# Patient Record
Sex: Male | Born: 1945 | Race: White | Hispanic: No | Marital: Married | Smoking: Former smoker
Health system: Southern US, Community
[De-identification: ages and names within clinical notes are randomized; demographics above are authoritative.]

## PROBLEM LIST (undated history)

## (undated) DIAGNOSIS — K219 Gastro-esophageal reflux disease without esophagitis: Secondary | ICD-10-CM

## (undated) DIAGNOSIS — I509 Heart failure, unspecified: Secondary | ICD-10-CM

## (undated) DIAGNOSIS — I10 Essential (primary) hypertension: Secondary | ICD-10-CM

## (undated) DIAGNOSIS — I255 Ischemic cardiomyopathy: Secondary | ICD-10-CM

## (undated) DIAGNOSIS — I219 Acute myocardial infarction, unspecified: Secondary | ICD-10-CM

## (undated) DIAGNOSIS — J449 Chronic obstructive pulmonary disease, unspecified: Secondary | ICD-10-CM

## (undated) DIAGNOSIS — Z9989 Dependence on other enabling machines and devices: Secondary | ICD-10-CM

## (undated) DIAGNOSIS — G4733 Obstructive sleep apnea (adult) (pediatric): Secondary | ICD-10-CM

## (undated) DIAGNOSIS — F329 Major depressive disorder, single episode, unspecified: Secondary | ICD-10-CM

## (undated) DIAGNOSIS — M109 Gout, unspecified: Secondary | ICD-10-CM

## (undated) DIAGNOSIS — F32A Depression, unspecified: Secondary | ICD-10-CM

## (undated) DIAGNOSIS — I501 Left ventricular failure: Secondary | ICD-10-CM

## (undated) DIAGNOSIS — E119 Type 2 diabetes mellitus without complications: Secondary | ICD-10-CM

## (undated) DIAGNOSIS — IMO0001 Reserved for inherently not codable concepts without codable children: Secondary | ICD-10-CM

## (undated) DIAGNOSIS — E7800 Pure hypercholesterolemia, unspecified: Secondary | ICD-10-CM

## (undated) DIAGNOSIS — D649 Anemia, unspecified: Secondary | ICD-10-CM

## (undated) DIAGNOSIS — C801 Malignant (primary) neoplasm, unspecified: Secondary | ICD-10-CM

## (undated) HISTORY — PX: CORONARY ANGIOPLASTY WITH STENT PLACEMENT: SHX49

## (undated) HISTORY — PX: TONSILLECTOMY: SUR1361

---

## 1988-12-06 DIAGNOSIS — I259 Chronic ischemic heart disease, unspecified: Secondary | ICD-10-CM | POA: Insufficient documentation

## 2004-06-21 ENCOUNTER — Other Ambulatory Visit: Payer: Self-pay

## 2004-06-22 ENCOUNTER — Other Ambulatory Visit: Payer: Self-pay

## 2006-05-15 ENCOUNTER — Other Ambulatory Visit: Payer: Self-pay

## 2006-05-15 ENCOUNTER — Inpatient Hospital Stay: Payer: Self-pay | Admitting: Psychiatry

## 2006-05-19 ENCOUNTER — Ambulatory Visit: Payer: Self-pay | Admitting: Unknown Physician Specialty

## 2006-06-05 ENCOUNTER — Ambulatory Visit: Payer: Self-pay | Admitting: Unknown Physician Specialty

## 2006-07-06 ENCOUNTER — Ambulatory Visit: Payer: Self-pay | Admitting: Unknown Physician Specialty

## 2006-08-19 ENCOUNTER — Inpatient Hospital Stay: Payer: Self-pay | Admitting: Internal Medicine

## 2006-08-19 ENCOUNTER — Other Ambulatory Visit: Payer: Self-pay

## 2006-11-03 ENCOUNTER — Ambulatory Visit: Payer: Self-pay | Admitting: Internal Medicine

## 2006-11-04 ENCOUNTER — Ambulatory Visit: Payer: Self-pay | Admitting: Internal Medicine

## 2006-12-29 ENCOUNTER — Ambulatory Visit: Payer: Self-pay | Admitting: Internal Medicine

## 2007-04-11 ENCOUNTER — Ambulatory Visit: Payer: Self-pay | Admitting: Internal Medicine

## 2007-05-08 ENCOUNTER — Ambulatory Visit: Payer: Self-pay | Admitting: Internal Medicine

## 2007-09-14 ENCOUNTER — Ambulatory Visit: Payer: Self-pay | Admitting: Gastroenterology

## 2009-12-12 ENCOUNTER — Ambulatory Visit: Payer: Self-pay | Admitting: Internal Medicine

## 2010-04-01 ENCOUNTER — Inpatient Hospital Stay: Payer: Self-pay | Admitting: Internal Medicine

## 2010-05-28 ENCOUNTER — Ambulatory Visit: Payer: Self-pay | Admitting: Urology

## 2010-06-03 ENCOUNTER — Ambulatory Visit: Payer: Self-pay | Admitting: Urology

## 2010-11-13 ENCOUNTER — Ambulatory Visit: Payer: Self-pay | Admitting: Internal Medicine

## 2010-11-24 HISTORY — PX: CORONARY ARTERY BYPASS GRAFT: SHX141

## 2010-12-20 ENCOUNTER — Emergency Department: Payer: Self-pay | Admitting: Emergency Medicine

## 2011-01-21 ENCOUNTER — Encounter: Payer: Self-pay | Admitting: Internal Medicine

## 2011-02-04 ENCOUNTER — Ambulatory Visit: Payer: Self-pay | Admitting: Internal Medicine

## 2011-02-04 ENCOUNTER — Encounter: Payer: Self-pay | Admitting: Internal Medicine

## 2011-03-07 ENCOUNTER — Ambulatory Visit: Payer: Self-pay | Admitting: Internal Medicine

## 2011-03-07 ENCOUNTER — Encounter: Payer: Self-pay | Admitting: Internal Medicine

## 2011-03-20 ENCOUNTER — Emergency Department: Payer: Self-pay | Admitting: Emergency Medicine

## 2011-03-31 ENCOUNTER — Ambulatory Visit: Payer: Self-pay | Admitting: Internal Medicine

## 2011-04-06 ENCOUNTER — Encounter: Payer: Self-pay | Admitting: Internal Medicine

## 2011-04-06 ENCOUNTER — Ambulatory Visit: Payer: Self-pay | Admitting: Internal Medicine

## 2011-05-07 ENCOUNTER — Encounter: Payer: Self-pay | Admitting: Internal Medicine

## 2011-12-20 ENCOUNTER — Emergency Department: Payer: Self-pay | Admitting: Emergency Medicine

## 2011-12-20 LAB — URINALYSIS, COMPLETE
Bacteria: NONE SEEN
Blood: NEGATIVE
Glucose,UR: NEGATIVE mg/dL (ref 0–75)
Leukocyte Esterase: NEGATIVE
Nitrite: NEGATIVE
Ph: 5 (ref 4.5–8.0)
Protein: NEGATIVE
RBC,UR: <1 /HPF (ref 0–5)
WBC UR: <1 /HPF (ref 0–5)

## 2011-12-20 LAB — CBC
MCH: 29.3 pg (ref 26.0–34.0)
MCV: 88 fL (ref 80–100)
Platelet: 187 10*3/uL (ref 150–440)
RBC: 4.25 10*6/uL — ABNORMAL LOW (ref 4.40–5.90)

## 2011-12-20 LAB — COMPREHENSIVE METABOLIC PANEL
Anion Gap: 11 (ref 7–16)
BUN: 18 mg/dL (ref 7–18)
Chloride: 99 mmol/L (ref 98–107)
EGFR (African American): >60
Osmolality: 281 (ref 275–301)
Potassium: 3.7 mmol/L (ref 3.5–5.1)
SGOT(AST): 37 U/L (ref 15–37)
SGPT (ALT): 52 U/L
Sodium: 138 mmol/L (ref 136–145)
Total Protein: 8.2 g/dL (ref 6.4–8.2)

## 2012-02-08 ENCOUNTER — Ambulatory Visit: Payer: Self-pay | Admitting: Gastroenterology

## 2012-03-01 ENCOUNTER — Ambulatory Visit: Payer: Self-pay | Admitting: Gastroenterology

## 2012-10-17 ENCOUNTER — Ambulatory Visit: Payer: Self-pay | Admitting: Internal Medicine

## 2012-11-26 ENCOUNTER — Emergency Department: Payer: Self-pay | Admitting: Emergency Medicine

## 2012-11-26 LAB — COMPREHENSIVE METABOLIC PANEL
Albumin: 3.5 g/dL (ref 3.4–5.0)
Anion Gap: 6 — ABNORMAL LOW (ref 7–16)
BUN: 12 mg/dL (ref 7–18)
Calcium, Total: 8.2 mg/dL — ABNORMAL LOW (ref 8.5–10.1)
Chloride: 107 mmol/L (ref 98–107)
Co2: 26 mmol/L (ref 21–32)
EGFR (African American): >60
EGFR (Non-African Amer.): >60
Glucose: 176 mg/dL — ABNORMAL HIGH (ref 65–99)
Osmolality: 282 (ref 275–301)
Potassium: 3.9 mmol/L (ref 3.5–5.1)
SGOT(AST): 27 U/L (ref 15–37)
Sodium: 139 mmol/L (ref 136–145)
Total Protein: 7.3 g/dL (ref 6.4–8.2)

## 2012-11-26 LAB — CBC
HCT: 32.6 % — ABNORMAL LOW (ref 40.0–52.0)
HGB: 11 g/dL — ABNORMAL LOW (ref 13.0–18.0)
MCH: 30.1 pg (ref 26.0–34.0)
MCHC: 33.7 g/dL (ref 32.0–36.0)
RBC: 3.64 10*6/uL — ABNORMAL LOW (ref 4.40–5.90)

## 2012-11-27 LAB — URINALYSIS, COMPLETE
Bacteria: NONE SEEN
Blood: NEGATIVE
Glucose,UR: NEGATIVE mg/dL (ref 0–75)
Leukocyte Esterase: NEGATIVE
Nitrite: NEGATIVE
Ph: 5 (ref 4.5–8.0)
Protein: NEGATIVE
Specific Gravity: 1.025 (ref 1.003–1.030)

## 2013-09-12 ENCOUNTER — Ambulatory Visit: Payer: Self-pay | Admitting: Internal Medicine

## 2013-12-06 DIAGNOSIS — C801 Malignant (primary) neoplasm, unspecified: Secondary | ICD-10-CM

## 2013-12-06 HISTORY — DX: Malignant (primary) neoplasm, unspecified: C80.1

## 2014-01-04 ENCOUNTER — Observation Stay: Payer: Self-pay | Admitting: Internal Medicine

## 2014-01-04 LAB — BASIC METABOLIC PANEL
Anion Gap: 9 (ref 7–16)
BUN: 10 mg/dL (ref 7–18)
CREATININE: 1.02 mg/dL (ref 0.60–1.30)
Calcium, Total: 8.2 mg/dL — ABNORMAL LOW (ref 8.5–10.1)
Chloride: 103 mmol/L (ref 98–107)
Co2: 24 mmol/L (ref 21–32)
EGFR (African American): >60
Glucose: 286 mg/dL — ABNORMAL HIGH (ref 65–99)
Osmolality: 281 (ref 275–301)
POTASSIUM: 3.7 mmol/L (ref 3.5–5.1)
SODIUM: 136 mmol/L (ref 136–145)

## 2014-01-04 LAB — CBC
HCT: 34.7 % — AB (ref 40.0–52.0)
HGB: 11.6 g/dL — AB (ref 13.0–18.0)
MCH: 30.3 pg (ref 26.0–34.0)
MCHC: 33.3 g/dL (ref 32.0–36.0)
MCV: 91 fL (ref 80–100)
PLATELETS: 197 10*3/uL (ref 150–440)
RBC: 3.82 10*6/uL — ABNORMAL LOW (ref 4.40–5.90)
RDW: 13.7 % (ref 11.5–14.5)
WBC: 7.3 10*3/uL (ref 3.8–10.6)

## 2014-01-04 LAB — PROTIME-INR
INR: 1
Prothrombin Time: 13.3 secs (ref 11.5–14.7)

## 2014-01-04 LAB — PRO B NATRIURETIC PEPTIDE: B-TYPE NATIURETIC PEPTID: 601 pg/mL — AB (ref 0–125)

## 2014-01-04 LAB — TROPONIN I: Troponin-I: <0.02 ng/mL

## 2014-01-05 LAB — TROPONIN I
Troponin-I: <0.02 ng/mL
Troponin-I: <0.02 ng/mL

## 2014-01-05 LAB — LIPID PANEL
Cholesterol: 140 mg/dL (ref 0–200)
HDL: 33 mg/dL — AB (ref 40–60)
Ldl Cholesterol, Calc: 86 mg/dL (ref 0–100)
Triglycerides: 103 mg/dL (ref 0–200)
VLDL Cholesterol, Calc: 21 mg/dL (ref 5–40)

## 2014-01-05 LAB — CK-MB
CK-MB: 1.5 ng/mL (ref 0.5–3.6)
CK-MB: 1.5 ng/mL (ref 0.5–3.6)
CK-MB: 1.6 ng/mL (ref 0.5–3.6)

## 2014-02-26 LAB — COMPREHENSIVE METABOLIC PANEL
ALBUMIN: 3.6 g/dL (ref 3.4–5.0)
ALK PHOS: 71 U/L
ALT: 68 U/L (ref 12–78)
Anion Gap: 4 — ABNORMAL LOW (ref 7–16)
BILIRUBIN TOTAL: 0.4 mg/dL (ref 0.2–1.0)
BUN: 19 mg/dL — AB (ref 7–18)
CHLORIDE: 105 mmol/L (ref 98–107)
CREATININE: 0.99 mg/dL (ref 0.60–1.30)
Calcium, Total: 8.4 mg/dL — ABNORMAL LOW (ref 8.5–10.1)
Co2: 29 mmol/L (ref 21–32)
EGFR (African American): >60
Glucose: 237 mg/dL — ABNORMAL HIGH (ref 65–99)
OSMOLALITY: 286 (ref 275–301)
POTASSIUM: 3.7 mmol/L (ref 3.5–5.1)
SGOT(AST): 57 U/L — ABNORMAL HIGH (ref 15–37)
Sodium: 138 mmol/L (ref 136–145)
TOTAL PROTEIN: 7.4 g/dL (ref 6.4–8.2)

## 2014-02-26 LAB — CBC
HCT: 34.6 % — ABNORMAL LOW (ref 40.0–52.0)
HGB: 11.5 g/dL — ABNORMAL LOW (ref 13.0–18.0)
MCH: 29.9 pg (ref 26.0–34.0)
MCHC: 33.2 g/dL (ref 32.0–36.0)
MCV: 90 fL (ref 80–100)
Platelet: 143 10*3/uL — ABNORMAL LOW (ref 150–440)
RBC: 3.85 10*6/uL — ABNORMAL LOW (ref 4.40–5.90)
RDW: 13.4 % (ref 11.5–14.5)
WBC: 7.9 10*3/uL (ref 3.8–10.6)

## 2014-02-26 LAB — PROTIME-INR
INR: 1.1
PROTHROMBIN TIME: 13.6 s (ref 11.5–14.7)

## 2014-02-26 LAB — TROPONIN I

## 2014-02-26 LAB — CK TOTAL AND CKMB (NOT AT ARMC)
CK, TOTAL: 115 U/L
CK-MB: 1.9 ng/mL (ref 0.5–3.6)

## 2014-02-26 LAB — APTT: Activated PTT: 28 secs (ref 23.6–35.9)

## 2014-02-27 ENCOUNTER — Observation Stay: Payer: Self-pay | Admitting: Family Medicine

## 2014-02-27 LAB — CK TOTAL AND CKMB (NOT AT ARMC)
CK, Total: 109 U/L
CK-MB: 2.4 ng/mL (ref 0.5–3.6)

## 2014-02-27 LAB — TROPONIN I
Troponin-I: 0.07 ng/mL — ABNORMAL HIGH
Troponin-I: 0.07 ng/mL — ABNORMAL HIGH

## 2014-06-12 DIAGNOSIS — C61 Malignant neoplasm of prostate: Secondary | ICD-10-CM | POA: Insufficient documentation

## 2014-12-11 ENCOUNTER — Emergency Department: Payer: Self-pay | Admitting: Emergency Medicine

## 2014-12-11 LAB — PRO B NATRIURETIC PEPTIDE: B-TYPE NATIURETIC PEPTID: 1270 pg/mL — AB (ref 0–125)

## 2014-12-11 LAB — PROTIME-INR
INR: 1.1
Prothrombin Time: 14.2 secs (ref 11.5–14.7)

## 2014-12-11 LAB — CBC
HCT: 35.1 % — AB (ref 40.0–52.0)
HGB: 11.2 g/dL — AB (ref 13.0–18.0)
MCH: 28.8 pg (ref 26.0–34.0)
MCHC: 31.8 g/dL — ABNORMAL LOW (ref 32.0–36.0)
MCV: 90 fL (ref 80–100)
Platelet: 266 10*3/uL (ref 150–440)
RBC: 3.88 10*6/uL — AB (ref 4.40–5.90)
RDW: 14.6 % — ABNORMAL HIGH (ref 11.5–14.5)
WBC: 14.3 10*3/uL — AB (ref 3.8–10.6)

## 2014-12-11 LAB — COMPREHENSIVE METABOLIC PANEL
ALK PHOS: 80 U/L
ALT: 51 U/L
Albumin: 3.5 g/dL (ref 3.4–5.0)
Anion Gap: 13 (ref 7–16)
BILIRUBIN TOTAL: 0.4 mg/dL (ref 0.2–1.0)
BUN: 15 mg/dL (ref 7–18)
CREATININE: 1.29 mg/dL (ref 0.60–1.30)
Calcium, Total: 7.2 mg/dL — ABNORMAL LOW (ref 8.5–10.1)
Chloride: 103 mmol/L (ref 98–107)
Co2: 22 mmol/L (ref 21–32)
EGFR (African American): >60
GFR CALC NON AF AMER: 59 — AB
Glucose: 273 mg/dL — ABNORMAL HIGH (ref 65–99)
OSMOLALITY: 286 (ref 275–301)
Potassium: 3.3 mmol/L — ABNORMAL LOW (ref 3.5–5.1)
SGOT(AST): 39 U/L — ABNORMAL HIGH (ref 15–37)
SODIUM: 138 mmol/L (ref 136–145)
TOTAL PROTEIN: 7.8 g/dL (ref 6.4–8.2)

## 2014-12-11 LAB — CK TOTAL AND CKMB (NOT AT ARMC)
CK, TOTAL: 124 U/L (ref 39–308)
CK-MB: 2 ng/mL (ref 0.5–3.6)

## 2014-12-11 LAB — TROPONIN I: Troponin-I: 0.04 ng/mL

## 2014-12-19 DIAGNOSIS — I501 Left ventricular failure: Secondary | ICD-10-CM

## 2014-12-19 HISTORY — DX: Left ventricular failure, unspecified: I50.1

## 2015-03-29 NOTE — Discharge Summary (Signed)
PATIENT NAME:  [name], [name] MR#:  [id number] DATE OF BIRTH:  Apr 12, 1946  DATE OF ADMISSION:  02/27/2014 DATE OF DISCHARGE: 02/28/2014   DISCHARGE DIAGNOSES:  1.  Chest pain.  2.  History of coronary artery disease.  3.  Hypertension. 4.  Hyperlipidemia.  5.  Insulin-dependent diabetes.  6.  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  7.  Depression.   DISCHARGE MEDICATIONS:  1.  Spironolactone 25 mg p.o. daily.  2.  Metoprolol tartrate 25 mg p.o. b.i.d.  3.  Metformin 1000 mg p.o. b.i.d.  4.  Citalopram 20 mg p.o. b.i.d.  5.  Aspirin 81 mg p.o. daily.  6.  Lantus 90 units subcu at bedtime.  7.  Loratadine 10 mg p.o. daily.  8.  Clopidogrel 75 mg p.o. daily. 9.  Imdur 60 mg extended release once daily.  10. Nitroglycerin 0.4 mg sublingual q.5 minutes x 3 as needed for chest pain.  11. Ferrous sulfate 325 mg p.o. b.i.d.  12. Furosemide 40 mg p.o. b.i.d.  13. Atorvastatin 80 mg p.o. at bedtime.  14. Gabapentin 300 mg 2 capsules t.i.d.  15. Humalog, the patient takes between 8 to 14 units t.i.d. with meals pending CBGs.  16. Lisinopril 20 mg 1-1/2 tablet p.o. daily.  17. Nexium 20 mg p.o. daily.   MEDICATIONS TO STOP: Omeprazole.   CONSULTS: Cardiology per Dr. Clayborn Bigness.   PROCEDURES: None.   PERTINENT LABS AND STUDIES: Cardiac enzymes were negative. Troponin slightly increased at 0.02, 0.07 and 0.07. CK-MB was negative. Chest x-ray negative. EKG showed left bundle branch block. On day of discharge, labs were pending. Upon admission, sodium was 130, potassium 3.7, creatinine 0.99. Hemoglobin 11.5, white blood cells 7.9, platelets 143.   BRIEF HOSPITAL COURSE:  1.  Chest pain. The patient initially came in with chest discomfort that was concerning for recurrent MI; however, his EKG showed no significant changes with a left bundle branch block. Also had no rise in the cardiac enzymes. He was evaluated by cardiology, who did not think that he needed  catheterization and the patient was not willing to undergo any stress test. His chest pain resolved during his admission. There was discussion on whether to increase the Imdur to twice a day and add Ranexa. Dr. Clayborn Bigness has not decided at this time. He is going to discuss this with the patient's primary cardiologist, Dr. Nehemiah Massed and may decide this after discharge or prior to discharge.  2.  Other chronic medical issues remained stable at this time. No changes to the regimen.   DISPOSITION: He is in stable condition and will be discharged to home.   FOLLOWUP: Follow up with Dr. Netty Starring within 10 days. He will need to get in with Dr. Nehemiah Massed within 14 days.  ____________________________ [name], MD kl:aw D: 02/28/2014 07:42:55 ET T: 02/28/2014 08:27:54 ET JOB#: [id number]  cc: [name], MD, <Dictator> [name] MD ELECTRONICALLY SIGNED 03/11/2014 10:35

## 2015-03-29 NOTE — Consult Note (Signed)
   Present Illness 69 yo male with history of cad s/pcabg with a liima to lad, svg to d1, om1, rpda admited with posterior neck pain. He has ruled out fro an mi and ekg is unremarkable for ischemia. He states the symtpoms are not typical of his angina and the back of his neck where the pain was located is now sore. He deneid any chest pain or sob. He underwent cardic cath 10/14 revealing 30% LM, 95% proximal lad, 100% mid lad with patent lima to lad adn patent svg to D1; 70% ostial circumflex and 70% mid circumflex with patent svg to OM1; Occluded rca with patent svg to rpda with 20% stenosis st stent site in pda graft. Medical therapy was recommended due to difuse disease and patent grafts. Pt has not had any chest pain similar to his angina   Physical Exam:  GEN no acute distress, obese   HEENT moist oral mucosa   NECK No masses   RESP normal resp effort   CARD Regular rate and rhythm  Normal, S1, S2  No murmur   ABD denies tenderness  denies Flank Tenderness  normal BS   LYMPH negative neck, negative axillae   EXTR negative cyanosis/clubbing, negative edema   SKIN normal to palpation   NEURO cranial nerves intact, motor/sensory function intact   PSYCH A+O to time, place, person   Review of Systems:  Subjective/Chief Complaint posterior neck pain   General: No Complaints   Skin: No Complaints   ENT: No Complaints   Neck: Pain   Respiratory: No Complaints   Cardiovascular: No Complaints   Gastrointestinal: No Complaints   Genitourinary: No Complaints   Vascular: No Complaints   Musculoskeletal: No Complaints   Neurologic: No Complaints   Hematologic: No Complaints   Endocrine: No Complaints   Psychiatric: No Complaints   Review of Systems: All other systems were reviewed and found to be negative   Medications/Allergies Reviewed Medications/Allergies reviewed   EKG:  EKG NSR   Abnormal NSSTTW changes   Interpretation no injury or ischemia     Benadryl: Other  Lopid: Other   Impression 69 yo male with history of cad s/p muliple pci and ultimately cabg x 4 with patent lima to lad and svg to d1, om1 and rpda documented by cath in 10/14. Had neck pain atypical for his angina. Neck is sore this am. Neck pain occured while walking. Different than his angina. Ruled out for mi and ekg unremarkable. Neck is sore this am and reporduced by palpation. Does not appear to be ischemia. Will discontinue ntp and continue with imdur and add ranexa. Contineu the remaineder of his meds. Ambulate and if stable consider discharge. Does not appear to require invasive cardiac evaluation at present gien recent cath showing diffuse dise . Medical management appeaer aprropriate. Neck pain does not appear to be ischemic   Plan 1. WIl Add ranexa 500 bid 2. Contineu remainder of his antianginal meds including clopidigrel, asas, beta blockers and long acting nitrates.  3. Ambulate today and consider discharge with outpatient follow up with Dr. Nehemiah Massed if he does not have chest pain.  4. Do not think neck pain is his anginal equivalent.   Electronic Signatures: Teodoro Spray (MD)  (Signed 31-Jan-15 10:32)  Authored: General Aspect/Present Illness, History and Physical Exam, Review of System, EKG , Allergies, Impression/Plan   Last Updated: 31-Jan-15 10:32 by Teodoro Spray (MD)

## 2015-03-29 NOTE — H&P (Signed)
PATIENT NAME:  [name], [name] MR#:  [id number] DATE OF BIRTH:  11-Apr-1946  DATE OF ADMISSION:  01/04/2014  REFERRING PHYSICIAN:  Dr. Winfred Leeds.  PRIMARY CARE PHYSICIAN:  Dr. Netty Starring at [facility].   CHIEF COMPLAINT:  Neck pain.   HISTORY OF PRESENT ILLNESS:  A 69 year old gentleman with history of coronary artery disease status post CABG as well as a multitude of stents, hypertension, insulin requiring diabetes, presenting with neck pain, had acute onset of neck pain described over posterior neck radiating to the left neck, 7 to 8 out of 10 in intensity, dull to sharp in quality, no worsening or relieving factors.  He had associated shortness of breath and nausea.  He had acute onset while he was carrying groceries out of the grocery store.  Symptoms lasted for approximately an hour.  He found relief with nitroglycerin and symptoms returned when he was in the Emergency Department, received nitroglycerin once more and had resolution of symptoms.  He states that he had similar presentations with previous myocardial infarctions.  Currently, he is without complaints.   REVIEW OF SYSTEMS:  CONSTITUTIONAL:  Denies fever, fatigue, weakness.  EYES:  Denies blurred vision, double vision, eye pain.  EARS, NOSE, THROAT:  Denies tinnitus, ear pain, hearing loss.  RESPIRATORY:  Denies cough, wheeze, hemoptysis.  Positive for associated shortness of breath.  CARDIOVASCULAR:  Denies any frank chest pain, orthopnea, edema, palpitations.  GASTROINTESTINAL:  Positive for nausea.  Denies any vomiting, diarrhea, abdominal pain.  GENITOURINARY:  Denies dysuria, hematuria.  ENDOCRINE:  Denies nocturia or thyroid problems.  HEMATOLOGY AND LYMPHATIC:  Denies easy bruising, bleeding.  SKIN:  Denies rash or lesion.  MUSCULOSKELETAL:  Denies pain in back, shoulder, knees, hips.  Positive for neck pain as described above.  NEUROLOGIC:  Denies paralysis, paresthesias. PSYCHIATRIC:  Denies any anxiety or  depressive symptoms.  Otherwise, full review of systems performed by me is negative.   PAST MEDICAL HISTORY:  Coronary artery disease status post CABG as well as many coronary stents placed, insulin requiring diabetes, hypertension, gastroesophageal reflux disease, and hyperlipidemia.   SOCIAL HISTORY:  Remote tobacco and remote alcohol usage.  Currently denies both.  Denies any drug usage.   FAMILY HISTORY:  Positive for coronary artery disease, hypertension as well as diabetes.   ALLERGIES:  BENADRYL AND LOPID.   HOME MEDICATIONS:  Spironolactone 25 mg by mouth daily, aspirin 81 mg by mouth daily, lisinopril 20 mg by mouth daily, Imdur 60 mg by mouth daily, gabapentin 300 mg by mouth twice daily, citalopram 20 mg by mouth twice daily, Humalog 10 units in the morning, Lantus 90 units at bedtime, metformin 1000 mg by mouth twice daily, loratadine 10 mg by mouth daily, atorvastatin 40 mg by mouth at bedtime, Plavix 75 mg by mouth daily, metoprolol 25 mg by mouth twice daily, Lasix 40 mg by mouth daily, Prilosec 20 mg by mouth daily, multivitamin 1 tablet by mouth daily.   PHYSICAL EXAMINATION: VITAL SIGNS:  Temperature 98.7, heart rate 67, respirations 20, blood pressure 166/72, saturating 95% on room air.  Weight 113.4 kg, BMI of 38.  GENERAL:  Obese Caucasian gentleman, currently in no acute distress.  HEAD:  Normocephalic, atraumatic.  EYES:  Pupils equal, round and reactive to light.  Extraocular muscles intact.  No scleral icterus.  MOUTH:  Moist mucous membranes.  Dentition intact.  No abscess noted.  EAR, NOSE, THROAT:  Throat clear without exudates.  No external lesions.  NECK:  Supple.  No thyromegaly.  No nodules.  No JVD.  Does have some tenderness to palpation of paraspinal bilaterally.   PULMONARY:  Clear to auscultation bilaterally without wheezes, rubs or rhonchi.  No use of accessory muscles.  Good respiratory effort.  CHEST:  Nontender to palpation.  CARDIOVASCULAR:  S1, S2,  regular rate and rhythm.  No murmurs, rubs or gallops.  No edema.  Pedal pulses 2+ bilaterally.  GASTROINTESTINAL:  Soft, nontender, nondistended, obese.  No masses.  Positive bowel sounds.  No hepatosplenomegaly.  MUSCULOSKELETAL:  No swelling, clubbing, edema.  Range of motion full at all extremities.  NEUROLOGIC:  Cranial nerves II through XII are intact.  No gross focal motor deficits.  Sensation intact.  Reflexes intact.  SKIN:  No ulcerations, lesions, rashes, cyanosis.  Skin warm, dry.  Turgor intact.  PSYCHIATRIC:  Mood and affect within normal limits.  The patient is awake, alert and oriented x 3.  Insight and judgment intact.   LABORATORY DATA:  EKG performed, normal sinus rhythm, heart rate 72 with left ventricular hypertrophy.  Sodium 136, potassium 3.7, chloride 103, bicarb 24, BUN 10, creatinine 1.02, glucose 286.  BNP 601, troponin I less than 0.02.  WBC 7.3, hemoglobin 11.6, platelets 197, INR of 1.  Chest x-ray performed revealing no acute cardiopulmonary process.   ASSESSMENT AND PLAN:  A 69 year old Caucasian gentleman with history of coronary artery disease, status post coronary artery bypass graft, as well as multiple stents presenting with neck pain.  His neck pain has been relieved with nitroglycerin.  1.  Neck pain/anginal equivalent.  EKG, cardiac enzymes within normal limits.  We will trend cardiac enzymes.  Admit to telemetry.  He has been given aspirin thus far.  Continue nitroglycerin as required.  2.  Coronary artery disease status post coronary artery bypass graft as well as multiple stents.  Continue statin therapy, aspirin, Imdur, lisinopril and Lopressor.  3.  Type 2 diabetes.  Continue insulin sliding scale with q. 6 hour Accu-Cheks.   4.  Gastroesophageal reflux disease, pantoprazole.  5.  VTE prophylaxis with hep subcu  6.  CODE STATUS:  THE PATIENT IS FULL CODE.   TIME SPENT:  45 minutes.    ____________________________ Aaron Mose. Hower,  MD dkh:ea D: 01/04/2014 23:58:15 ET T: 01/05/2014 02:52:06 ET JOB#: [id number]  cc: Aaron Mose. Hower, MD, <Dictator> DAVID Woodfin Ganja MD ELECTRONICALLY SIGNED 01/06/2014 20:33

## 2015-03-29 NOTE — Discharge Summary (Signed)
PATIENT NAME:  LEMARCUS, [name] MR#:  [id number] DATE OF BIRTH:  21-Dec-1945  DATE OF ADMISSION:  01/04/2014 DATE OF DISCHARGE:  01/06/2014  DISCHARGE DIAGNOSES: 1. Atypical chest pain with cervicalgia.  2. Diabetes mellitus, insulin requiring.  3. Hypertension.  4. Neuropathy.  5. Coronary artery disease.   DISCHARGE MEDICATIONS: Spironolactone 25 mg daily, metoprolol tartrate 25 mg b.i.d., metformin 1000 mg b.i.d. citalopram 20 mg b.i.d., aspirin 81 mg daily. Omeprazole 20 mg daily.  Lasix 40 mg daily. Lisinopril 20 mg daily. Gabapentin 300 mg b.i.d. Humalog 10 units q.a.m., 12 units q.p.m., Lantus 90 units daily. Atorvastatin 40 mg daily, loratadine 10 mg daily. Plavix 75 mg daily. Imdur 60 mg daily. Nitrostat p.r.n. chest pain.   REASON FOR ADMISSION: A 69 year old male presents with neck pain and nausea. Please see H and P for HPI, past medical history, and physical exam.   HOSPITAL COURSE: The patient was admitted, had some transient neck pain with diaphoresis, resolved. Still is very slightly this morning on palpation. Good range of motion of the neck, not to be related to his heart. Enzymes were negative. Dr. Ubaldo Glassing reviewed his heart catheterization and felt for further stress testing or catheter was not necessary. Solu-Medrol given x 1 to try to deal with cervicalgia and he will follow up with Dr. Netty Starring and Dr. Nehemiah Massed as scheduled. No sign for acute coronary syndrome or other unstable disease.   ____________________________ Rusty Aus, MD mfm:sg D: 01/06/2014 08:48:35 ET T: 01/06/2014 09:05:37 ET JOB#: [id number]  cc: Rusty Aus, MD, <Dictator> Daniel Bierlein Roselee Culver MD ELECTRONICALLY SIGNED 01/06/2014 10:03

## 2015-03-29 NOTE — Consult Note (Signed)
PATIENT NAME:  [name], [name] MR#:  [id number] DATE OF BIRTH:  1946/11/24  CARDIOLOGY CONSULTATION   DATE OF CONSULTATION:  02/27/2014  CONSULTING PHYSICIAN:  Royale Swamy D. Clayborn Bigness, MD  PRIMARY PHYSICIAN: [name], MD  CARDIOLOGIST: [name], MD  INDICATION: Chest pain, known coronary artery disease.  HISTORY OF PRESENT ILLNESS: The patient is a 69 year old male with a history of coronary artery disease, multiple stents, status post coronary bypass surgery, who came to the Emergency Room with recurrent chest pain. He states to have had it on and off over the last few hours. It usually resolves by itself, but he required nitroglycerin, which he normally does not do, and subsequently, the pain resolved over time. EKG and cardiac enzymes were unremarkable in the Emergency Room, but he has a baseline left bundle branch block, which limits interpretation. His last admission was back in January for chest pain. He saw Dr. Ubaldo Glassing at that time, who thought it was atypical. The patient has had a left heart catheterization probably last year October 2014. The patient prefers not to have stress test done, and last catheterization showed peripheral distal disease. The patient states that pain is better now since being in the hospital and being on nitroglycerin.   PAST MEDICAL HISTORY: Coronary artery disease, diabetes, hypertension, reflux, hyperlipidemia, COPD, mild obesity, diabetic neuropathy.   PAST SURGICAL HISTORY: Coronary artery bypass surgery, PCI and stents.   ALLERGIES: BENADRYL, LOPID.   MEDICATIONS: Spironolactone 25 a day, omeprazole 20 a day, nitroglycerin sublingual p.r.n., metoprolol 25 q.12, metformin 1000 twice a day, loratadine 10 mg once a day, lisinopril 30 a day, Lantus 90 units each day, Imdur 60, Humalog sliding scale, gabapentin 600 mg, Lasix 40 mg twice a day, ferrous sulfate 325 mg twice a day, Plavix 75 a day, citalopram 20 twice a day, atorvastatin 80 a day,  aspirin 81 mg a day.   FAMILY HISTORY: Positive for coronary artery disease, hypertension.   SOCIAL HISTORY: Former smoker. History of alcohol abuse. Denies recent smoking or alcohol consumption. Married, lives with his wife.   REVIEW OF SYSTEMS: Denies blackout spells or syncope. No nausea or vomiting. No fever. No chills. No sweats. No weight loss. No weight gain. No hemoptysis or hematemesis. Denies bright red blood per rectum. He has had chest pain symptoms which brought him to the Emergency Room; otherwise, he is doing reasonably well.   PHYSICAL EXAMINATION:  VITAL SIGNS: Blood pressure 130/60, pulse is 75, respiratory rate of 18, afebrile.  HEENT: Normocephalic, atraumatic. Pupils are equal and reactive to light.  NECK: Supple. No significant JVD, bruits or adenopathy.  LUNGS: Clear to auscultation and percussion. No significant wheezing, rhonchi or rale.  HEART: Regular rate and rhythm. No significant murmur, gallops or rub.  EXTREMITIES: Within normal limits.  NEUROLOGIC: Intact.  SKIN: Normal.   LABORATORIES: Glucose 237, MET-B otherwise unremarkable. CBC unremarkable. Chest x-ray essentially unremarkable. White count 7.9, hemoglobin 11.5, platelet count of 143. Coags are negative. Troponin less than 0.02.   ASSESSMENT:  1. Chest pain, possible angina.  2. Coronary artery disease.  3. Diabetes.  4. Hypertension.  5. Obesity.  6. Hyperlipidemia.   PLAN:  1. Agree with admit. Rule out for myocardial infarction. Continue current therapy. Would probably increase Imdur to 60 twice a day. Would also entertain adding Ranexa. Otherwise, would continue beta blocker therapy as well as continue nitroglycerin p.r.n. and lisinopril.  2. Continue anticoagulation with aspirin and Plavix because of known coronary artery disease.  3.  For diabetes, continue metformin as well as Lantus with Humalog.  4. For hyperlipidemia, continue atorvastatin 80.  5. For hypertension, the patient is on  spironolactone, metoprolol and lisinopril as well as Imdur, with adequate control.  6. For gastroesophageal reflux disease, continue omeprazole therapy.  7. Since the patient is not interested in a stress test, I do not believe that a cardiac catheterization at this point is indicated. Would probably treat the patient medically with Imdur as well as Ranexa and consider following up the patient as an outpatient. Will discuss the case with Dr. Nehemiah Massed.  ____________________________ Loran Senters. Clayborn Bigness, MD ddc:lb D: 02/27/2014 10:35:00 ET T: 02/27/2014 11:22:52 ET JOB#: [id number]  cc: Hailie Searight D. Clayborn Bigness, MD, <Dictator> Yolonda Kida MD ELECTRONICALLY SIGNED 03/19/2014 12:40

## 2015-03-29 NOTE — Consult Note (Signed)
Chief Complaint:  Subjective/Chief Complaint Pt doing ok no worsening cp.   VITAL SIGNS/ANCILLARY NOTES: **Vital Signs.:   26-Mar-15 04:28  Vital Signs Type Routine  Pulse Pulse 60  Respirations Respirations 18  Systolic BP Systolic BP 637  Diastolic BP (mmHg) Diastolic BP (mmHg) 71  Mean BP 96  Pulse Ox % Pulse Ox % 94  Pulse Ox Activity Level  At rest  Oxygen Delivery Room Air/ 21 %  *Intake and Output.:   26-Mar-15 04:31  Grand Totals Intake:   Output:      Net:   24 Hr.:  -1020  Weight Type daily  Weight Method Bed  Current Weight (lbs) (lbs) 245.6  Current Weight (kg) (kg) 111.4  Height (ft) (feet) 5  Height (in) (in) 7  Height (cm) centimeters 170.1  BSA (m2) 2.2  BMI (kg/m2) 38.5    Daily 07:00  Grand Totals Intake:  480 Output:  1500    Net:  -1020 24 Hr.:  -1020  Oral Intake      In:  480  Urine ml     Out:  1500  Length of Stay Totals Intake:  480 Output:  1800    Net:  -1320   Brief Assessment:  GEN well developed, well nourished, no acute distress   Cardiac Regular  murmur present   Respiratory normal resp effort  clear BS   Gastrointestinal Normal   Gastrointestinal details normal Soft   EXTR negative cyanosis/clubbing, negative edema   Lab Results: Hepatic:  24-Mar-15 22:25   Bilirubin, Total 0.4  Alkaline Phosphatase 71 (45-117 NOTE: New Reference Range 10/26/13)  SGPT (ALT) 68  SGOT (AST)  57  Total Protein, Serum 7.4  Albumin, Serum 3.6  Cardiology:  24-Mar-15 22:13   Ventricular Rate 79  Atrial Rate 79  P-R Interval 152  QRS Duration 152  QT 458  QTc 525  P Axis 63  R Axis 34  T Axis 134  ECG interpretation Sinus rhythm with frequent Premature ventricular complexes Left bundle branch block Abnormal ECG When compared with ECG of 05-Jan-2014 08:20, Premature ventricular complexes are now Present Left bundle branch block is now Present Criteria for Septal infarct are no longer  Present ----------unconfirmed---------- Confirmed by OVERREAD, NOT ([id number]), editor PEARSON, BARBARA ([id number]) on 02/27/2014 10:06:28 AM  Routine Chem:  24-Mar-15 22:25   Glucose, Serum  237  BUN  19  Creatinine (comp) 0.99  Sodium, Serum 138  Potassium, Serum 3.7  Chloride, Serum 105  CO2, Serum 29  Calcium (Total), Serum  8.4  Osmolality (calc) 286  eGFR (African American) >60  eGFR (Non-African American) >60 (eGFR values <17mL/min/1.73 m2 may be an indication of chronic kidney disease (CKD). Calculated eGFR is useful in patients with stable renal function. The eGFR calculation will not be reliable in acutely ill patients when serum creatinine is changing rapidly. It is not useful in  patients on dialysis. The eGFR calculation may not be applicable to patients at the low and high extremes of body sizes, pregnant women, and vegetarians.)  Anion Gap  4  25-Mar-15 06:28   Result Comment troponin - RESULTS VERIFIED BY REPEAT TESTING.  - READ-BACK PROCESS PERFORMED.  - c/leanna henderson,rn 02/27/2014 @ 0708/  - ...srb  Result(s) reported on 27 Feb 2014 at 07:11AM.    10:50   Result Comment troponin - RESULTS VERIFIED BY REPEAT TESTING.  - previously called 02/27/2014 $RemoveBeforeDEI'@0708'[id number]$   - by srb..srb  Result(s) reported on 27 Feb 2014 at 11:34AM.  Cardiac:  24-Mar-15 22:25   Troponin I < 0.02 (0.00-0.05 0.05 ng/mL or less: NEGATIVE  Repeat testing in 3-6 hrs  if clinically indicated. >0.05 ng/mL: POTENTIAL  MYOCARDIAL INJURY. Repeat  testing in 3-6 hrs if  clinically indicated. NOTE: An increase or decrease  of 30% or more on serial  testing suggests a  clinically important change)  CK, Total 115 (39-308 NOTE: NEW REFERENCE RANGE  01/07/2014)  CPK-MB, Serum 1.9 (Result(s) reported on 26 Feb 2014 at 10:58PM.)  25-Mar-15 06:28   Troponin I  0.07 (0.00-0.05 0.05 ng/mL or less: NEGATIVE  Repeat testing in 3-6 hrs  if clinically indicated. >0.05 ng/mL: POTENTIAL  MYOCARDIAL INJURY.  Repeat  testing in 3-6 hrs if  clinically indicated. NOTE: An increase or decrease  of 30% or more on serial  testing suggests a  clinically important change)  CK, Total 109 (39-308 NOTE: NEW REFERENCE RANGE  01/07/2014)  CPK-MB, Serum 2.4 (Result(s) reported on 27 Feb 2014 at 07:06AM.)    10:50   Troponin I  0.07 (0.00-0.05 0.05 ng/mL or less: NEGATIVE  Repeat testing in 3-6 hrs  if clinically indicated. >0.05 ng/mL: POTENTIAL  MYOCARDIAL INJURY. Repeat  testing in 3-6 hrs if  clinically indicated. NOTE: An increase or decrease  of 30% or more on serial  testing suggests a  clinically important change)  Routine Coag:  24-Mar-15 22:25   Prothrombin 13.6  INR 1.1 (INR reference interval applies to patients on anticoagulant therapy. A single INR therapeutic range for coumarins is not optimal for all indications; however, the suggested range for most indications is 2.0 - 3.0. Exceptions to the INR Reference Range may include: Prosthetic heart valves, acute myocardial infarction, prevention of myocardial infarction, and combinations of aspirin and anticoagulant. The need for a higher or lower target INR must be assessed individually. Reference: The Pharmacology and Management of the Vitamin K  antagonists: the seventh ACCP Conference on Antithrombotic and Thrombolytic Therapy. LPNPY.0511 Sept:126 (3suppl): [id number]. A HCT value >55% may artifactually increase the PT.  In one study,  the increase was an average of 25%. Reference:  "Effect on Routine and Special Coagulation Testing Values of Citrate Anticoagulant Adjustment in Patients with High HCT Values." American Journal of Clinical Pathology 2006;126:400-405.)  Activated PTT (APTT) 28.0 (A HCT value >55% may artifactually increase the APTT. In one study, the increase was an average of 19%. Reference: "Effect on Routine and Special Coagulation Testing Values of Citrate Anticoagulant Adjustment in Patients with High HCT  Values." American Journal of Clinical Pathology 2006;126:400-405.)  Routine Hem:  24-Mar-15 22:25   WBC (CBC) 7.9  RBC (CBC)  3.85  Hemoglobin (CBC)  11.5  Hematocrit (CBC)  34.6  Platelet Count (CBC)  143 (Result(s) reported on 26 Feb 2014 at 10:45PM.)  MCV 90  MCH 29.9  MCHC 33.2  RDW 13.4   Radiology Results: XRay:    24-Mar-15 22:26, Chest Portable Single View  Chest Portable Single View   REASON FOR EXAM:    Chest Pain  COMMENTS:       PROCEDURE: DXR - DXR PORTABLE CHEST SINGLE VIEW  - Feb 26 2014 10:26PM     CLINICAL DATA:  Intermittent chest pain.    EXAM:  PORTABLE CHEST - 1 VIEW    COMPARISON:  DG CHEST 1V PORT dated 01/04/2014    FINDINGS:  Sequelae of prior CABG are again identified. The cardiac silhouette  remains mildly enlarged, unchanged. The lungs are well inflated and  clear. No pleural effusion or  pneumothorax is identified.  Osteophytosis is present in the thoracic spine.     IMPRESSION:  No evidence of acute cardiopulmonary disease.      Electronically Signed    By: Logan Bores    On: 02/26/2014 22:52         Verified By: Ferol Luz, M.D.,  Cardiology:    24-Mar-15 22:13, ECG  Ventricular Rate 79  Atrial Rate 79  P-R Interval 152  QRS Duration 152  QT 458  QTc 525  P Axis 63  R Axis 34  T Axis 134  ECG interpretation   Sinus rhythm with frequent Premature ventricular complexes  Left bundle branch block  Abnormal ECG  When compared with ECG of 05-Jan-2014 08:20,  Premature ventricular complexes are now Present  Left bundle branch block is now Present  Criteria for Septal infarct are no longer Present  ----------unconfirmed----------  Confirmed by OVERREAD, NOT ([id number]), editor PEARSON, BARBARA ([id number]) on 02/27/2014 10:06:28 AM  ECG    Assessment/Plan:  Assessment/Plan:  Assessment [place] CAD HTN Hyperlipidemia .   Plan PLAN increase Imdur $RemoveBefore'120mg'[id number]$  daily Add Raxena $RemoveBe'500mg'gUREvghjc$  bid Continue ASA 81 mg daily Agree with Plavix 75  mg daily Continue Statin po Agree with HTN control F/U with BK 1-2 weeks   Electronic Signatures: Lujean Amel D (MD)  (Signed 26-Mar-15 10:04)  Authored: Chief Complaint, VITAL SIGNS/ANCILLARY NOTES, Brief Assessment, Lab Results, Radiology Results, Assessment/Plan   Last Updated: 26-Mar-15 10:04 by Lujean Amel D (MD)

## 2015-03-29 NOTE — H&P (Signed)
PATIENT NAME:  [name], [name] MR#:  [id number] DATE OF BIRTH:  1946/06/09  DATE OF ADMISSION:  02/26/2014  PRIMARY CARE PHYSICIAN: Dr. Netty Starring.   PRIMARY CARDIOLOGIST: Dr. Nehemiah Massed.   REFERRING PHYSICIAN: Dr. Jimmye Norman.   CHIEF COMPLAINT: Chest pain.   HISTORY OF PRESENT ILLNESS: Mr. Monnier is a 69 year old male with a past medical history of coronary artery disease, multiple stents in the past, followed by coronary artery bypass grafting. Required another stent placement after the bypass surgery. Comes to the Emergency Department with complaints of chest pain. It started yesterday while at rest in the retrosternal area, 10/10 intensity, pressure-like pain. No radiation. The pain has been on and off. Each episode lasts about an hour. Usually resolves by itself. Tonight, the patient had persistent pain. Concerning this, took some nitroglycerin which relieved the pain. Concerning about the persistent pain, presented to the Emergency Department. Workup in the Emergency Department with EKG and cardiac enzymes were unremarkable. The patient has baseline left bundle branch block. The patient was admitted in January 2015 for similar chest pain. At that time seen by Dr. Ubaldo Glassing. Felt it was atypical chest pain. The patient had a left heart cath in October 2014 and was found to have distal disease.   PAST MEDICAL HISTORY:  1. Coronary artery disease, status post bypass grafting.  2. Diabetes mellitus, insulin requiring.  3. Hypertension.  4. Gastroesophageal reflux disease.   5. Hyperlipidemia.  6. COPD.   7. Morbid obesity.  8. Diabetic neuropathy.   PAST SURGICAL HISTORY: Coronary artery bypass grafting.   ALLERGIES: BENADRYL AND LOPID.    HOME MEDICATIONS:  1. Spironolactone 25 mg once a day.  2. Omeprazole 20 mg once a day.  3. Nitroglycerin 0.4 mg sublingual as needed.  4. Metoprolol 25 mg every 12 hours.  5. Metformin 1000 mg 2 times a day.  6. Loratadine 10 mg once a day.  7.  Lisinopril 30 mg once a day.  8. Lantus 90 units once a day.  9. Imdur 60 mg once a day.  10. Humalog on a sliding scale insulin.  11. Gabapentin 600 mg  times a day.  12. Lasix 40 mg 2 times a day.  13. Ferrous sulfate 325 mg 2 times a day.  14. Plavix 75 mg once a day.  15. Citalopram 20 mg 2 times a day.  16. Atorvastatin 80 mg once a day.  17. Aspirin 81 mg once a day.   SOCIAL HISTORY: Former smoker and history of alcohol use. Currently denies smoking, drinking alcohol or using illicit drugs. Married, lives with his wife.   FAMILY HISTORY: Positive for coronary artery disease, hypertension.   REVIEW OF SYSTEMS:  CONSTITUTIONAL: Experiences generalized weakness. Fatigue with walking.  EYES: No change in vision.  ENT: No change in hearing.  RESPIRATORY: Experiences shortness of breath with any exertion.  CARDIOVASCULAR: Has chest pain.  GASTROINTESTINAL: No nausea, vomiting, abdominal pain.  GENITOURINARY: No dysuria or hematuria.  ENDOCRINE: Has diabetes mellitus.  HEMATOLOGIC: No easy bruising or bleeding.  SKIN: No rash or lesions.  MUSCULOSKELETAL: Has diffuse joint aches.   NEUROLOGIC: Has peripheral neuropathy. No  or numbness in any part of the body.   PHYSICAL EXAMINATION:  GENERAL: This is a well-built, well-nourished, morbidly obese male lying down in the bed, not in distress.  VITAL SIGNS: Temperature 98.3, pulse 73, blood pressure 129/61, respiratory rate of 20, oxygen saturation is 98% on 2 liters of oxygen.  HEENT: Head normocephalic, atraumatic. There is no  scleral icterus. Conjunctivae normal. Pupils equal and react. Extraocular movements are intact. Mucous membranes moist. Could not examine the oropharynx.  NECK: Supple. No lymphadenopathy. No JVD. No carotid bruit.  CHEST: No focal tenderness.  LUNGS: Bilaterally clear to auscultation.  HEART: S1, S2 regular. No murmurs are heard.  ABDOMEN: Bowel sounds present. Soft, nontender, nondistended. Obese abdomen.  Could not appreciate hepatosplenomegaly.  EXTREMITIES: No pedal edema. Pulses 2+.  NEUROLOGIC: The patient is alert, oriented to place, person and time. Cranial nerves II through XII intact. Motor 5/5 in upper and lower extremities.  SKIN: No rash or lesions.  MUSCULOSKELETAL: Good range of motion in all of the extremities.   LABORATORIES: Glucose 237. The rest of all of the values are within normal limits.   Chest x-ray, 1 view portable: No acute cardiopulmonary disease.   CBC: WBC of 7.9, hemoglobin 11.5, platelet count of 143.   Coag profile is well within normal limits. CK-MB is 1.9. Troponin less than 0.02.   ASSESSMENT AND PLAN: Ms. Hildreth is a 69 year old male with extensive history of coronary artery disease. Comes to the Emergency Department with complaints of chest pain.  1. Chest pain: This seems to be atypical pain. The patient has been experiencing pain  without elevation of any cardiac enzymes. There is a possibility of distal disease; however, concerning the patient's multiple risk factors, admit the patient to a monitored bed. Cycle cardiac enzymes x 3. Continue with home medications of aspirin, Plavix, statin, ACE inhibitors and beta blockers.  2. Diabetes mellitus: Will keep the patient on 70 units daily as the patient's diet is more carbohydrate restricted while in the hospital. Will also continue sliding scale insulin.  3. Morbid obesity with poor functional status: Encouraged the patient to walk. The patient has a body mass index of 39. The patient states that has generalized body aches; however, encouraged the patient to continue walking.  4. Keep the patient on deep vein thrombosis prophylaxis with Lovenox.   TIME SPENT: 45 minutes.   ____________________________ Monica Becton, MD pv:gb D: 02/27/2014 00:29:31 ET T: 02/27/2014 01:13:09 ET JOB#: [id number]  cc: Monica Becton, MD, <Dictator> [name], MD Monica Becton MD ELECTRONICALLY SIGNED  03/21/2014 0:17

## 2015-09-23 DIAGNOSIS — D649 Anemia, unspecified: Secondary | ICD-10-CM | POA: Insufficient documentation

## 2015-11-20 ENCOUNTER — Other Ambulatory Visit: Payer: Self-pay | Admitting: Gastroenterology

## 2015-11-20 DIAGNOSIS — K2100 Gastro-esophageal reflux disease with esophagitis, without bleeding: Secondary | ICD-10-CM

## 2015-11-20 DIAGNOSIS — R110 Nausea: Secondary | ICD-10-CM

## 2015-11-27 ENCOUNTER — Encounter
Admission: RE | Admit: 2015-11-27 | Discharge: 2015-11-27 | Disposition: A | Discharge status: Home / Self Care | Payer: Medicare Other | Source: Ambulatory Visit | Attending: Gastroenterology | Admitting: Gastroenterology

## 2015-11-27 DIAGNOSIS — K2100 Gastro-esophageal reflux disease with esophagitis, without bleeding: Secondary | ICD-10-CM

## 2015-11-27 DIAGNOSIS — R110 Nausea: Secondary | ICD-10-CM | POA: Diagnosis present

## 2015-11-27 MED ORDER — TECHNETIUM TC 99M SULFUR COLLOID
2.0000 | Freq: Once | INTRAVENOUS | Status: AC | PRN
  Administered 2015-11-27: 2.17 via INTRAVENOUS

## 2015-12-04 ENCOUNTER — Encounter
Admission: RE | Admit: 2015-12-04 | Discharge: 2015-12-04 | Disposition: A | Discharge status: Home / Self Care | Payer: Medicare Other | Source: Ambulatory Visit | Attending: Cardiology | Admitting: Cardiology

## 2015-12-04 DIAGNOSIS — Z01812 Encounter for preprocedural laboratory examination: Secondary | ICD-10-CM | POA: Diagnosis present

## 2015-12-04 HISTORY — DX: Ischemic cardiomyopathy: I25.5

## 2015-12-04 HISTORY — DX: Malignant (primary) neoplasm, unspecified: C80.1

## 2015-12-04 HISTORY — DX: Pure hypercholesterolemia, unspecified: E78.00

## 2015-12-04 HISTORY — DX: Type 2 diabetes mellitus without complications: E11.9

## 2015-12-04 HISTORY — DX: Essential (primary) hypertension: I10

## 2015-12-04 HISTORY — DX: Heart failure, unspecified: I50.9

## 2015-12-04 HISTORY — DX: Gastro-esophageal reflux disease without esophagitis: K21.9

## 2015-12-04 HISTORY — DX: Acute myocardial infarction, unspecified: I21.9

## 2015-12-04 HISTORY — DX: Anemia, unspecified: D64.9

## 2015-12-04 HISTORY — DX: Left ventricular failure, unspecified: I50.1

## 2015-12-04 HISTORY — DX: Chronic obstructive pulmonary disease, unspecified: J44.9

## 2015-12-04 HISTORY — DX: Reserved for inherently not codable concepts without codable children: IMO0001

## 2015-12-04 LAB — URINALYSIS COMPLETE WITH MICROSCOPIC (ARMC ONLY)
BACTERIA UA: NONE SEEN
Bilirubin Urine: NEGATIVE
Glucose, UA: NEGATIVE mg/dL
Hgb urine dipstick: NEGATIVE
KETONES UR: NEGATIVE mg/dL
LEUKOCYTES UA: NEGATIVE
NITRITE: NEGATIVE
PROTEIN: NEGATIVE mg/dL
SPECIFIC GRAVITY, URINE: 1.02 (ref 1.005–1.030)
pH: 5 (ref 5.0–8.0)

## 2015-12-04 LAB — DIFFERENTIAL
Basophils Absolute: 0 10*3/uL (ref 0–0.1)
Basophils Relative: 0 %
EOS PCT: 1 %
Eosinophils Absolute: 0.1 10*3/uL (ref 0–0.7)
LYMPHS ABS: 0.8 10*3/uL — AB (ref 1.0–3.6)
LYMPHS PCT: 12 %
MONO ABS: 0.6 10*3/uL (ref 0.2–1.0)
MONOS PCT: 8 %
Neutro Abs: 5.7 10*3/uL (ref 1.4–6.5)
Neutrophils Relative %: 79 %

## 2015-12-04 LAB — BASIC METABOLIC PANEL
ANION GAP: 7 (ref 5–15)
BUN: 24 mg/dL — ABNORMAL HIGH (ref 6–20)
CHLORIDE: 105 mmol/L (ref 101–111)
CO2: 29 mmol/L (ref 22–32)
Calcium: 8.9 mg/dL (ref 8.9–10.3)
Creatinine, Ser: 1.18 mg/dL (ref 0.61–1.24)
GFR calc non Af Amer: >60 mL/min (ref >60)
Glucose, Bld: 84 mg/dL (ref 65–99)
Potassium: 4.1 mmol/L (ref 3.5–5.1)
Sodium: 141 mmol/L (ref 135–145)

## 2015-12-04 LAB — CBC
HEMATOCRIT: 29.4 % — AB (ref 40.0–52.0)
HEMOGLOBIN: 9.8 g/dL — AB (ref 13.0–18.0)
MCH: 29.6 pg (ref 26.0–34.0)
MCHC: 33.4 g/dL (ref 32.0–36.0)
MCV: 88.5 fL (ref 80.0–100.0)
Platelets: 162 10*3/uL (ref 150–440)
RBC: 3.32 MIL/uL — AB (ref 4.40–5.90)
RDW: 14.1 % (ref 11.5–14.5)
WBC: 7.2 10*3/uL (ref 3.8–10.6)

## 2015-12-04 LAB — SURGICAL PCR SCREEN
MRSA, PCR: NEGATIVE
STAPHYLOCOCCUS AUREUS: NEGATIVE

## 2015-12-04 LAB — PROTIME-INR
INR: 1.09
Prothrombin Time: 14.3 seconds (ref 11.4–15.0)

## 2015-12-04 LAB — APTT: aPTT: 27 seconds (ref 24–36)

## 2015-12-04 NOTE — Pre-Procedure Instructions (Addendum)
Called Dr. Mylinda Latina' office staff [facility]ells then [facility]) to clarify if pt needs to stop Plavix prior to surgery, he informed me that they do not have pts stop Plavix prior to pacemaker/defib placement at their facility.

## 2015-12-04 NOTE — Pre-Procedure Instructions (Signed)
Abnormal CBC, PT,PTT results faxed to Dr. Mylinda Latina for review.

## 2015-12-04 NOTE — Patient Instructions (Addendum)
  Your procedure is scheduled on: Friday Jan. 6 , 2017. Report to Same Day Surgery. To find out your arrival time please call [phone] between 1PM - 3PM on Thursday Jan. 5, 2017.  Remember: Instructions that are not followed completely may result in serious medical risk, up to and including death, or upon the discretion of your surgeon and anesthesiologist your surgery may need to be rescheduled.    _x___ 1. Do not eat food or drink liquids after midnight. No gum chewing or hard candies.     ____ 2. No Alcohol for 24 hours before or after surgery.   ____ 3. Bring all medications with you on the day of surgery if instructed.    __x__ 4. Notify your doctor if there is any change in your medical condition     (cold, fever, infections).     Do not wear jewelry, make-up, hairpins, clips or nail polish.  Do not wear lotions, powders, or perfumes. You may wear deodorant.  Do not shave 48 hours prior to surgery. Men may shave face and neck.  Do not bring valuables to the hospital.    [facility] is not responsible for any belongings or valuables.               Contacts, dentures or bridgework may not be worn into surgery.  Leave your suitcase in the car. After surgery it may be brought to your room.  For patients admitted to the hospital, discharge time is determined by your treatment team.   Patients discharged the day of surgery will not be allowed to drive home.    Please read over the following fact sheets that you were given:   [facility] Preparing for Surgery  __x__ Take these medicines the morning of surgery with A SIP OF WATER:    1. As instructed by Dr. Marcello Moores, take all morning meds except Metformin and insulin.  ____ Fleet Enema (as directed)   _x___ Use CHG Soap as directed  _x___ Use inhalers on the day of surgery & bring inhalers to the hospital.  ____ Stop metformin 2 days prior to surgery    __x__ Take 1/2 of usual insulin dose the night before surgery and none  on the morning of surgery.   ____ Stop Coumadin/Plavix/aspirin on   ____ Stop Anti-inflammatories on Can take Tylenol if needed for pain.   ____ Stop supplements until after surgery.    __x__ Bring C-Pap to the hospital.

## 2015-12-12 ENCOUNTER — Observation Stay: Payer: PPO

## 2015-12-12 ENCOUNTER — Ambulatory Visit: Payer: PPO | Admitting: Anesthesiology

## 2015-12-12 ENCOUNTER — Encounter
Admission: RE | Disposition: A | Discharge status: Home / Self Care | Payer: Self-pay | Source: Ambulatory Visit | Attending: Cardiology

## 2015-12-12 ENCOUNTER — Ambulatory Visit
Admission: RE | Admit: 2015-12-12 | Discharge: 2015-12-13 | Disposition: A | Discharge status: Home / Self Care | Payer: PPO | Source: Ambulatory Visit | Attending: Cardiology | Admitting: Cardiology

## 2015-12-12 ENCOUNTER — Ambulatory Visit: Payer: PPO

## 2015-12-12 ENCOUNTER — Encounter: Payer: Self-pay | Admitting: Anesthesiology

## 2015-12-12 DIAGNOSIS — Z87891 Personal history of nicotine dependence: Secondary | ICD-10-CM | POA: Diagnosis not present

## 2015-12-12 DIAGNOSIS — D649 Anemia, unspecified: Secondary | ICD-10-CM | POA: Diagnosis not present

## 2015-12-12 DIAGNOSIS — Z794 Long term (current) use of insulin: Secondary | ICD-10-CM | POA: Insufficient documentation

## 2015-12-12 DIAGNOSIS — E119 Type 2 diabetes mellitus without complications: Secondary | ICD-10-CM | POA: Insufficient documentation

## 2015-12-12 DIAGNOSIS — I509 Heart failure, unspecified: Secondary | ICD-10-CM | POA: Diagnosis not present

## 2015-12-12 DIAGNOSIS — Z7951 Long term (current) use of inhaled steroids: Secondary | ICD-10-CM | POA: Insufficient documentation

## 2015-12-12 DIAGNOSIS — I2510 Atherosclerotic heart disease of native coronary artery without angina pectoris: Secondary | ICD-10-CM | POA: Diagnosis not present

## 2015-12-12 DIAGNOSIS — Z888 Allergy status to other drugs, medicaments and biological substances status: Secondary | ICD-10-CM | POA: Diagnosis not present

## 2015-12-12 DIAGNOSIS — J449 Chronic obstructive pulmonary disease, unspecified: Secondary | ICD-10-CM | POA: Diagnosis not present

## 2015-12-12 DIAGNOSIS — G4730 Sleep apnea, unspecified: Secondary | ICD-10-CM | POA: Insufficient documentation

## 2015-12-12 DIAGNOSIS — Z7982 Long term (current) use of aspirin: Secondary | ICD-10-CM | POA: Insufficient documentation

## 2015-12-12 DIAGNOSIS — Z006 Encounter for examination for normal comparison and control in clinical research program: Secondary | ICD-10-CM | POA: Diagnosis not present

## 2015-12-12 DIAGNOSIS — Z951 Presence of aortocoronary bypass graft: Secondary | ICD-10-CM | POA: Insufficient documentation

## 2015-12-12 DIAGNOSIS — I5022 Chronic systolic (congestive) heart failure: Secondary | ICD-10-CM | POA: Diagnosis not present

## 2015-12-12 DIAGNOSIS — Z95810 Presence of automatic (implantable) cardiac defibrillator: Secondary | ICD-10-CM | POA: Diagnosis not present

## 2015-12-12 DIAGNOSIS — I252 Old myocardial infarction: Secondary | ICD-10-CM | POA: Insufficient documentation

## 2015-12-12 DIAGNOSIS — R0602 Shortness of breath: Secondary | ICD-10-CM | POA: Diagnosis not present

## 2015-12-12 DIAGNOSIS — K219 Gastro-esophageal reflux disease without esophagitis: Secondary | ICD-10-CM | POA: Insufficient documentation

## 2015-12-12 DIAGNOSIS — E7800 Pure hypercholesterolemia, unspecified: Secondary | ICD-10-CM | POA: Insufficient documentation

## 2015-12-12 DIAGNOSIS — R0609 Other forms of dyspnea: Secondary | ICD-10-CM | POA: Insufficient documentation

## 2015-12-12 DIAGNOSIS — Z79899 Other long term (current) drug therapy: Secondary | ICD-10-CM | POA: Insufficient documentation

## 2015-12-12 DIAGNOSIS — Z950 Presence of cardiac pacemaker: Secondary | ICD-10-CM | POA: Diagnosis not present

## 2015-12-12 DIAGNOSIS — I255 Ischemic cardiomyopathy: Secondary | ICD-10-CM | POA: Diagnosis not present

## 2015-12-12 HISTORY — PX: IMPLANTABLE CARDIOVERTER DEFIBRILLATOR (ICD) GENERATOR CHANGE: SHX5469

## 2015-12-12 LAB — GLUCOSE, CAPILLARY
GLUCOSE-CAPILLARY: 92 mg/dL (ref 65–99)
Glucose-Capillary: 79 mg/dL (ref 65–99)
Glucose-Capillary: 92 mg/dL (ref 65–99)

## 2015-12-12 SURGERY — ICD GENERATOR CHANGE
Anesthesia: General | Site: Chest | Laterality: Left | Wound class: Clean

## 2015-12-12 MED ORDER — CLOPIDOGREL BISULFATE 75 MG PO TABS
75.0000 mg | ORAL_TABLET | ORAL | Status: DC
  Administered 2015-12-13: 75 mg via ORAL
  Filled 2015-12-12: qty 1

## 2015-12-12 MED ORDER — SODIUM CHLORIDE 0.9 % IV SOLN
INTRAVENOUS | Status: DC
  Administered 2015-12-12: 13:00:00 via INTRAVENOUS

## 2015-12-12 MED ORDER — CEFAZOLIN SODIUM-DEXTROSE 2-3 GM-% IV SOLR
INTRAVENOUS | Status: AC
Start: 2015-12-12 — End: 2015-12-12
  Administered 2015-12-12: 2 g via INTRAVENOUS
  Filled 2015-12-12: qty 50

## 2015-12-12 MED ORDER — LIDOCAINE HCL (CARDIAC) 20 MG/ML IV SOLN
INTRAVENOUS | Status: DC | PRN
  Administered 2015-12-12: 50 mg via INTRAVENOUS

## 2015-12-12 MED ORDER — FINASTERIDE 5 MG PO TABS
5.0000 mg | ORAL_TABLET | Freq: Every day | ORAL | Status: DC
  Administered 2015-12-12: 5 mg via ORAL
  Filled 2015-12-12: qty 1

## 2015-12-12 MED ORDER — PROPOFOL 500 MG/50ML IV EMUL
INTRAVENOUS | Status: DC | PRN
  Administered 2015-12-12: 50 ug/kg/min via INTRAVENOUS

## 2015-12-12 MED ORDER — LIDOCAINE 1 % OPTIME INJ - NO CHARGE
INTRAMUSCULAR | Status: DC | PRN
  Administered 2015-12-12: 26 mL

## 2015-12-12 MED ORDER — ASPIRIN 81 MG PO CHEW
81.0000 mg | CHEWABLE_TABLET | ORAL | Status: DC
  Administered 2015-12-13: 81 mg via ORAL
  Filled 2015-12-12: qty 1

## 2015-12-12 MED ORDER — GLYCOPYRROLATE 0.2 MG/ML IJ SOLN
INTRAMUSCULAR | Status: DC | PRN
  Administered 2015-12-12: 0.2 mg via INTRAVENOUS

## 2015-12-12 MED ORDER — ONDANSETRON HCL 4 MG/2ML IJ SOLN: 4.0000 mg | Freq: Four times a day (QID) | INTRAMUSCULAR | Status: DC | PRN

## 2015-12-12 MED ORDER — FUROSEMIDE 40 MG PO TABS
40.0000 mg | ORAL_TABLET | ORAL | Status: DC
  Administered 2015-12-13: 40 mg via ORAL
  Filled 2015-12-12: qty 1

## 2015-12-12 MED ORDER — SODIUM CHLORIDE 0.9 % IR SOLN
Freq: Once | Status: DC
  Filled 2015-12-12: qty 2

## 2015-12-12 MED ORDER — INSULIN LISPRO 100 UNIT/ML ~~LOC~~ SOLN
15.0000 [IU] | Freq: Three times a day (TID) | SUBCUTANEOUS | Status: DC
  Filled 2015-12-12: qty 10

## 2015-12-12 MED ORDER — FLUOXETINE HCL 20 MG PO CAPS
20.0000 mg | ORAL_CAPSULE | ORAL | Status: DC
  Administered 2015-12-13: 20 mg via ORAL
  Filled 2015-12-12 (×3): qty 1

## 2015-12-12 MED ORDER — ACETAMINOPHEN 325 MG PO TABS
325.0000 mg | ORAL_TABLET | ORAL | Status: DC | PRN
  Administered 2015-12-13: 650 mg via ORAL
  Filled 2015-12-12: qty 2

## 2015-12-12 MED ORDER — PHENYLEPHRINE HCL 10 MG/ML IJ SOLN
INTRAMUSCULAR | Status: DC | PRN
  Administered 2015-12-12: 100 ug via INTRAVENOUS

## 2015-12-12 MED ORDER — SODIUM CHLORIDE 0.9 % IV SOLN
INTRAVENOUS | Status: DC | PRN
  Administered 2015-12-12: 1000 mL via INTRAMUSCULAR

## 2015-12-12 MED ORDER — OXYCODONE HCL 5 MG/5ML PO SOLN: 5.0000 mg | Freq: Once | ORAL | Status: DC | PRN

## 2015-12-12 MED ORDER — MIDAZOLAM HCL 5 MG/5ML IJ SOLN
INTRAMUSCULAR | Status: DC | PRN
  Administered 2015-12-12: 1 mg via INTRAVENOUS

## 2015-12-12 MED ORDER — GABAPENTIN 300 MG PO CAPS
600.0000 mg | ORAL_CAPSULE | Freq: Three times a day (TID) | ORAL | Status: DC
  Administered 2015-12-12 – 2015-12-13 (×3): 600 mg via ORAL
  Filled 2015-12-12 (×3): qty 2

## 2015-12-12 MED ORDER — KETAMINE HCL 10 MG/ML IJ SOLN
INTRAMUSCULAR | Status: DC | PRN
  Administered 2015-12-12: 25 mg via INTRAVENOUS

## 2015-12-12 MED ORDER — METOPROLOL TARTRATE 50 MG PO TABS: 50.0000 mg | ORAL_TABLET | Freq: Two times a day (BID) | ORAL | Status: DC

## 2015-12-12 MED ORDER — FENTANYL CITRATE (PF) 100 MCG/2ML IJ SOLN
INTRAMUSCULAR | Status: DC | PRN
  Administered 2015-12-12 (×2): 50 ug via INTRAVENOUS

## 2015-12-12 MED ORDER — LISINOPRIL 20 MG PO TABS
30.0000 mg | ORAL_TABLET | ORAL | Status: DC
  Administered 2015-12-13: 30 mg via ORAL
  Filled 2015-12-12: qty 1

## 2015-12-12 MED ORDER — TAMSULOSIN HCL 0.4 MG PO CAPS: 0.4000 mg | ORAL_CAPSULE | Freq: Every day | ORAL | Status: DC

## 2015-12-12 MED ORDER — CEFAZOLIN SODIUM-DEXTROSE 2-3 GM-% IV SOLR
2.0000 g | Freq: Once | INTRAVENOUS | Status: AC
  Administered 2015-12-12: 2 g via INTRAVENOUS

## 2015-12-12 MED ORDER — LIDOCAINE HCL 2 % EX GEL
CUTANEOUS | Status: DC | PRN
  Administered 2015-12-12: 1 via TOPICAL

## 2015-12-12 MED ORDER — SPIRONOLACTONE 25 MG PO TABS
25.0000 mg | ORAL_TABLET | ORAL | Status: DC
  Administered 2015-12-13: 25 mg via ORAL
  Filled 2015-12-12: qty 1

## 2015-12-12 MED ORDER — RANOLAZINE ER 500 MG PO TB12
500.0000 mg | ORAL_TABLET | Freq: Two times a day (BID) | ORAL | Status: DC
  Administered 2015-12-12 – 2015-12-13 (×3): 500 mg via ORAL
  Filled 2015-12-12 (×3): qty 1

## 2015-12-12 MED ORDER — EPHEDRINE SULFATE 50 MG/ML IJ SOLN
INTRAMUSCULAR | Status: DC | PRN
  Administered 2015-12-12 (×3): 10 mg via INTRAVENOUS
  Administered 2015-12-12: 5 mg via INTRAVENOUS

## 2015-12-12 MED ORDER — ISOSORBIDE MONONITRATE ER 60 MG PO TB24
60.0000 mg | ORAL_TABLET | Freq: Two times a day (BID) | ORAL | Status: DC
  Administered 2015-12-12 – 2015-12-13 (×3): 60 mg via ORAL
  Filled 2015-12-12 (×3): qty 1

## 2015-12-12 MED ORDER — ALBUTEROL SULFATE (2.5 MG/3ML) 0.083% IN NEBU: 3.0000 mL | INHALATION_SOLUTION | Freq: Three times a day (TID) | RESPIRATORY_TRACT | Status: DC | PRN

## 2015-12-12 MED ORDER — ATORVASTATIN CALCIUM 20 MG PO TABS
80.0000 mg | ORAL_TABLET | Freq: Every day | ORAL | Status: DC
  Administered 2015-12-12: 80 mg via ORAL
  Filled 2015-12-12: qty 1
  Filled 2015-12-12: qty 4

## 2015-12-12 MED ORDER — METFORMIN HCL 500 MG PO TABS: 1000.0000 mg | ORAL_TABLET | Freq: Two times a day (BID) | ORAL | Status: DC

## 2015-12-12 MED ORDER — SODIUM CHLORIDE 0.9 % IR SOLN
Status: DC | PRN
  Administered 2015-12-12: 500 mL

## 2015-12-12 MED ORDER — INSULIN ASPART 100 UNIT/ML ~~LOC~~ SOLN
15.0000 [IU] | Freq: Three times a day (TID) | SUBCUTANEOUS | Status: DC
  Administered 2015-12-12 – 2015-12-13 (×2): 15 [IU] via SUBCUTANEOUS
  Filled 2015-12-12 (×2): qty 15

## 2015-12-12 MED ORDER — OXYCODONE HCL 5 MG PO TABS: 5.0000 mg | ORAL_TABLET | Freq: Once | ORAL | Status: DC | PRN

## 2015-12-12 MED ORDER — LORATADINE 10 MG PO TABS
10.0000 mg | ORAL_TABLET | Freq: Every day | ORAL | Status: DC
  Administered 2015-12-13: 10 mg via ORAL
  Filled 2015-12-12: qty 1

## 2015-12-12 MED ORDER — FENTANYL CITRATE (PF) 100 MCG/2ML IJ SOLN: 25.0000 ug | INTRAMUSCULAR | Status: DC | PRN

## 2015-12-12 MED ORDER — FERROUS SULFATE 325 (65 FE) MG PO TABS
325.0000 mg | ORAL_TABLET | Freq: Two times a day (BID) | ORAL | Status: DC
  Administered 2015-12-12 – 2015-12-13 (×2): 325 mg via ORAL
  Filled 2015-12-12 (×2): qty 1

## 2015-12-12 MED ORDER — INSULIN GLARGINE 100 UNIT/ML ~~LOC~~ SOLN
90.0000 [IU] | Freq: Every day | SUBCUTANEOUS | Status: DC
  Administered 2015-12-12: 90 [IU] via SUBCUTANEOUS
  Filled 2015-12-12 (×2): qty 0.9

## 2015-12-12 MED ORDER — BUDESONIDE-FORMOTEROL FUMARATE 80-4.5 MCG/ACT IN AERO
2.0000 | INHALATION_SPRAY | Freq: Two times a day (BID) | RESPIRATORY_TRACT | Status: DC
  Administered 2015-12-12 – 2015-12-13 (×2): 2 via RESPIRATORY_TRACT
  Filled 2015-12-12: qty 6.9

## 2015-12-12 SURGICAL SUPPLY — 44 items
BAG DECANTER FOR FLEXI CONT (MISCELLANEOUS) ×3 IMPLANT
CABLE SURG 12 DISP A/V CHANNEL (MISCELLANEOUS) ×3 IMPLANT
CANISTER SUCT 1200ML W/VALVE (MISCELLANEOUS) ×3 IMPLANT
CHLORAPREP W/TINT 26ML (MISCELLANEOUS) ×3 IMPLANT
COVER LIGHT HANDLE STERIS (MISCELLANEOUS) ×6 IMPLANT
COVER MAYO STAND STRL (DRAPES) ×3 IMPLANT
COVER PROBE FLX POLY STRL (MISCELLANEOUS) ×3 IMPLANT
DRAPE C-ARM XRAY 36X54 (DRAPES) ×3 IMPLANT
DRESSING TELFA 4X3 1S ST N-ADH (GAUZE/BANDAGES/DRESSINGS) ×3 IMPLANT
DRSG TEGADERM 4X4.75 (GAUZE/BANDAGES/DRESSINGS) ×3 IMPLANT
GLOVE BIO SURGEON STRL SZ7.5 (GLOVE) ×3 IMPLANT
GOWN STRL REUS W/ TWL LRG LVL3 (GOWN DISPOSABLE) ×1 IMPLANT
GOWN STRL REUS W/ TWL XL LVL3 (GOWN DISPOSABLE) ×1 IMPLANT
GOWN STRL REUS W/TWL LRG LVL3 (GOWN DISPOSABLE) ×2
GOWN STRL REUS W/TWL XL LVL3 (GOWN DISPOSABLE) ×2
HANDLE YANKAUER SUCT BULB TIP (MISCELLANEOUS) ×3 IMPLANT
ICD EVERA DR XT MRI DDMB1D4 (ICD Generator) ×3 IMPLANT
IMMOBILIZER SHDR LG LX 900803 (SOFTGOODS) ×3 IMPLANT
INTRO PACEMAKR LEAD 9FR 13CM (INTRODUCER) ×6
INTRO PACEMKR SHEATH II 7FR (MISCELLANEOUS) ×3
INTRODUCER PACEMKR LD 9FR 13CM (INTRODUCER) ×2 IMPLANT
INTRODUCER PACEMKR SHTH II 7FR (MISCELLANEOUS) ×1 IMPLANT
IV NS 1000ML (IV SOLUTION) ×2
IV NS 1000ML BAXH (IV SOLUTION) ×1 IMPLANT
KIT RM TURNOVER STRD PROC AR (KITS) ×3 IMPLANT
LABEL OR SOLS (LABEL) ×3 IMPLANT
LEAD CAPSURE NOVUS 5076-52CM (Lead) ×6 IMPLANT
LEAD SPRINT QUAT SEC 6935M-62 (Lead) ×3 IMPLANT
NEEDLE FILTER BLUNT 18X 1/2SAF (NEEDLE) ×2
NEEDLE FILTER BLUNT 18X1 1/2 (NEEDLE) ×1 IMPLANT
NEEDLE HYPO 25X1 1.5 SAFETY (NEEDLE) ×3 IMPLANT
NEEDLE SPNL 22GX3.5 QUINCKE BK (NEEDLE) ×3 IMPLANT
NS IRRIG 500ML POUR BTL (IV SOLUTION) ×3 IMPLANT
PACK PACE INSERTION (MISCELLANEOUS) ×3 IMPLANT
PAD GROUND ADULT SPLIT (MISCELLANEOUS) ×3 IMPLANT
PAD STATPAD (MISCELLANEOUS) ×3 IMPLANT
SUT DVC V-LOC 4-0 90 CLR P-12 (SUTURE) ×3
SUT DVC VLOC 3-0 CL 6 P-12 (SUTURE) ×3 IMPLANT
SUT SILK 0 CT 1 30 (SUTURE) ×3 IMPLANT
SUT VIC AB 3-0 PS2 18 (SUTURE) ×3 IMPLANT
SUTURE DVC V-LC4-0 90 CLR P-12 (SUTURE) ×1 IMPLANT
SYR CONTROL 10ML (SYRINGE) ×3 IMPLANT
SYRINGE 10CC LL (SYRINGE) ×3 IMPLANT
TOWEL OR 17X26 4PK STRL BLUE (TOWEL DISPOSABLE) ×3 IMPLANT

## 2015-12-12 NOTE — Op Note (Signed)
[name] [id number]  [id number]  Preop [id number] systolic HF Postop Dx same : ICD in situ NYHA Class II  Cx: none apparent   Procedure: dual  chamber ICD implantation without intraoperative defibrillation threshold testing  Following the obtaining of informed consent the patient was brought to the electrophysiology laboratory in place of the fluoroscopic table in the supine position. After routine prep and drape, lidocaine was infiltrated in the prepectoral subclavicular region and an incision was made and carried down to the layer of the prepectoral fascia using electrocautery and sharp dissection. A pocket was formed similarly.  Thereafter  attention was turned to gaining access to the extrathoracic left subclavian vein which was accomplished without difficulty and without the aspiration of air or puncture of the artery.One  venipuncture was accomplished  Retained wire technique was utilized. Sequentially  A 9 French sheath  And 20F sheath were placed through which were   passed a single coil  active fixation defibrillator lead, model 6935M serial number [id number] and a 5076 active fixation atrial lead, serial number [id number] .  They were   passed under fluoroscopic guidance to the right ventricular septum and R atrial anterior wall  respectively.    In its location the bipolar R wave was >30.97millivolts, impedance was 875ohms, the pacing threshold was 0.6volts at 0.42msec. There was no diaphragmatic pacing at 10 V. The current of injury was brisk.  The bipolar P wave was 4.4 millivolts, impedance was 577 ohms, the pacing threshold was 0.8 volts at 0.5 msec.   There was no diaphragmatic pacing at 10 V. The current of injury was brisk   The leads were secured to the prepectoral fascia and then attached to a Medtronic ICD, serial number  [id number].  Through the device, the bipolar R wave was >10millivolts, impedance was875 ohms, the pacing threshold was 0.6 volts at 0.5 msec.  The  bipolar P wave was 4.15millivolts, impedance was 577 ohms, the pacing threshold was 0.8 volts at 0.5 msec.       The pocket was copiously irrigated with antibiotic containing saline solution. Hemostasis was assured, and the device and the leads were placed in the pocket and secured to the prepectoral fascia.  The wound was closed in 3 layers in normal fashion. The wound was washed dried. Steri-Strips tegaderm dressing was  applied. Needle counts, sponge counts and instrument counts were correct at the end of the procedure according to the staff.  EBL minimal

## 2015-12-12 NOTE — Care Management Important Message (Signed)
no review need for OIB

## 2015-12-12 NOTE — Anesthesia Preprocedure Evaluation (Signed)
Anesthesia Evaluation  Patient identified by MRN, date of birth, ID band Patient awake    Reviewed: Allergy & Precautions, H&P , NPO status , Patient's Chart, lab work & pertinent test results  History of Anesthesia Complications Negative for: history of anesthetic complications  Airway Mallampati: III  TM Distance: >3 FB Neck ROM: limited    Dental  (+) Poor Dentition, Missing, Edentulous Lower, Edentulous Upper   Pulmonary shortness of breath, sleep apnea, Continuous Positive Airway Pressure Ventilation and Oxygen sleep apnea , COPD,  COPD inhaler, former smoker,    Pulmonary exam normal breath sounds clear to auscultation       Cardiovascular Exercise Tolerance: Poor hypertension, (-) angina+ CAD, + Past MI, +CHF and + DOE  Normal cardiovascular exam Rhythm:regular Rate:Normal     Neuro/Psych negative neurological ROS  negative psych ROS   GI/Hepatic Neg liver ROS, GERD  Controlled,  Endo/Other  diabetes, Poorly Controlled, Type 2, Insulin Dependent  Renal/GU negative Renal ROS  negative genitourinary   Musculoskeletal   Abdominal   Peds  Hematology negative hematology ROS (+)   Anesthesia Other Findings Past Medical History:   Anemia                                                       Cancer ([place])                                    12/2013         Comment:prostate   Shortness of breath dyspnea                                  COPD (chronic obstructive pulmonary disease) (*              Cardiogenic pulmonary edema ([place])               12/19/2014    Diabetes mellitus without complication (HCC)                 Hypertension                                                 Hypercholesteremia                                           CHF (congestive heart failure) (HCC)                         Cardiomyopathy, ischemic                                     GERD (gastroesophageal reflux disease)                      Myocardial infarction ([place])  [id number]*  Past Surgical History:   CORONARY ANGIOPLASTY WITH STENT PLACEMENT                     CORONARY ARTERY BYPASS GRAFT                     11/24/2010   TONSILLECTOMY                                                BMI    Body Mass Index   38.02 kg/m 2      Reproductive/Obstetrics negative OB ROS                             Anesthesia Physical Anesthesia Plan  ASA: IV  Anesthesia Plan: General and MAC   Post-op Pain Management:    Induction:   Airway Management Planned:   Additional Equipment:   Intra-op Plan:   Post-operative Plan:   Informed Consent: I have reviewed the patients History and Physical, chart, labs and discussed the procedure including the risks, benefits and alternatives for the proposed anesthesia with the patient or authorized representative who has indicated his/her understanding and acceptance.   Dental Advisory Given  Plan Discussed with: Anesthesiologist, CRNA and Surgeon  Anesthesia Plan Comments:         Anesthesia Quick Evaluation

## 2015-12-12 NOTE — H&P (Signed)
Patient Profile:   [name] is a very pleasant 70 y.o. male who I was asked to see by my colleague for consideration of a primary prevention ICD.   Current Medications:  Current Outpatient Prescriptions  Medication Sig Dispense Refill  . albuterol (PROAIR HFA) 90 mcg/actuation inhaler Inhale 2 inhalations into the lungs 3 (three) times daily as needed for Wheezing. 1 Inhaler 12  . aspirin 81 MG EC tablet Take 81 mg by mouth once daily.  Marland Kitchen atorvastatin (LIPITOR) 80 MG tablet Take 80 mg by mouth once daily.  . budesonide-formoterol (SYMBICORT) 80-4.5 mcg/actuation inhaler Inhale 2 inhalations into the lungs 2 (two) times daily. 3 Inhaler 1  . clopidogrel (PLAVIX) 75 mg tablet Take 75 mg by mouth once daily.  . ferrous sulfate 325 (65 FE) MG tablet Take 325 mg by mouth 2 (two) times daily.  . finasteride (PROSCAR) 5 mg tablet Take 5 mg by mouth once daily.  Marland Kitchen FLUoxetine (PROZAC) 20 MG capsule Take 20 mg by mouth once daily.  . FUROsemide (LASIX) 40 MG tablet Take 40 mg by mouth daily.  Marland Kitchen gabapentin (NEURONTIN) 300 MG capsule TAKE 2 CAPSULES (600 MG TOTAL) BY MOUTH 3 (THREE) TIMES DAILY. 180 capsule 5  . insulin GLARGINE (LANTUS SOLOSTAR) 100 unit/mL pen injector Inject 90 Units subcutaneously nightly. 30 mL 6  . insulin LISPRO (HUMALOG KWIKPEN) 100 unit/mL pen injector Inject 8-14 Units subcutaneously 3 (three) times daily before meals. 15 mL 6  . isosorbide mononitrate (IMDUR) 30 MG ER tablet Take 2 tablets (60 mg total) by mouth 2 (two) times daily. 120 tablet 4  . lisinopril (PRINIVIL,ZESTRIL) 20 MG tablet Take 30 mg by mouth once daily.   Marland Kitchen loratadine (CLARITIN) 10 mg tablet TAKE 1 TABLET BY MOUTH ONCE A DAY 90 tablet 1  . metFORMIN (GLUCOPHAGE) 1000 MG tablet Take 1,000 mg by mouth 2 (two) times daily with meals.  . metoprolol tartrate (LOPRESSOR) 50 MG tablet Take 50 mg by mouth 2 (two) times daily.  . multivitamin tablet Take 1 tablet by mouth daily.  . nitroGLYcerin  (NITROSTAT) 0.4 MG SL tablet Place 0.4 mg under the tongue every 5 (five) minutes as needed for Chest pain. Take 1 tablet under tongue as directed PRN. Take 1 tablet q19minutes with max dose of 3 doses. If 3rd dose is needed, call 911  . pantoprazole (PROTONIX) 40 MG DR tablet Take 40 mg by mouth 2 (two) times daily.   . ranolazine (RANEXA) 500 MG ER tablet Take 1 tablet (500 mg total) by mouth 2 (two) times daily. 180 tablet 3  . spironolactone (ALDACTONE) 25 MG tablet Take 25 mg by mouth daily.  . tamsulosin (FLOMAX) 0.4 mg capsule TAKE 1 CAPSULE (0.4 MG TOTAL) BY MOUTH ONCE DAILY. TAKE 30 MINUTES AFTER SAME MEAL EACH DAY. 30 capsule 11  . folic acid (FOLVITE) 1 MG tablet Take 1 mg by mouth once daily.  . isosorbide mononitrate (IMDUR) 30 MG ER tablet TAKE 2 TABLETS (60 MG TOTAL) BY MOUTH 2 (TWO) TIMES DAILY. 120 tablet 0   No current facility-administered medications for this visit.    Allergies:  Diphenhydramine; Doxycycline; and Lopid [gemfibrozil]  History of Present Illness   [name] is a very pleasant 70 y.o. 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 The patient has had shortness of breath, has not had chest pain, has not had near syncope, has not had syncope, and has had palpitations. The patient has had orthopnea or PND.   Family  History   . Patient denies any family history of sudden cardiac death.    Social History  . Marital status: Married  Spouse name: N/A  . Number of children: N/A  . Years of education: N/A    Social History Main Topics  . Smoking status: Former Smoker  Packs/day: 2.00  Years: 35.00  Types: Cigarettes  Quit date: 12/07/1995  . Smokeless tobacco: Never Used  . 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 Drug use: No  . Sexual activity: Defer   Other Topics Concern  . Not on file   Social History Narrative  Marital Status- Married  Lives with wife  Employment- Retired  Exercise hx- None   Religious Affiliation- [employer]   Review of Systems   14 systems reviewed pertinent positives and negatives as noted in the HPI and below.  Per HPI otherwise reviewed negative  Physical Examinations   Vital Signs:  Visit Vitals  . BP 122/70 (BP Location: Left upper arm, Patient Position: Sitting, BP Cuff Size: Large Adult)  . Pulse 60  . Ht 172.7 cm (5\' 8" )  . Wt (!) 116.6 kg (257 lb)  . BMI 39.08 kg/m2  Body mass index is 39.08 kg/(m^2). General appearance: He appears well, in no apparent distress. Alert and oriented times three. obese  Neck: supple, no significant adenopathy, carotids upstroke normal bilaterally, no bruits   Thyroid: thyroid is normal in size without nodules or tenderness   Lungs: clear to auscultation, no wheezes, rales or rhonchi, symmetric air entry   CV: normal rate and regular rhythm  Abdomen: soft, nontender, nondistended, no masses or organomegaly   Neurological: alert, oriented, normal speech, no focal findings or movement disorder noted   Musculoskeletal: full range of motion without pain, moves all extremities. Ambulates with difficulty.   Extremities: 2+ pedal edema, no clubbing or cyanosis   ECG  Rhythm: normal sinus rhythm, rate=56 bpm, QTc=496 ms. QRS=136   Echocardiogram  Date: 10/2015 EF 20%  Labs   Hematology Lab Results  Component Value Date  WBC 6.0 09/16/2015  HGB 10.1 (L) 09/16/2015  HCT 31.5 (L) 09/16/2015  MCV 91.6 09/16/2015  PLT 168 09/16/2015   Chemistries Lab Results  Component Value Date  CREATININE 1.0 09/16/2015  BUN 13 09/16/2015  NA 139 09/16/2015  K 4.6 09/16/2015  CL 102 09/16/2015  CO2 27.6 09/16/2015   Liver Function Studies Lab Results  Component Value Date  ALT 21 09/16/2015  AST 18 09/16/2015  ALKPHOS 47 09/16/2015   Thyroid Function Studies Lab Results  Component Value Date  TSH 0.54 12/12/2014   INR Lab Results  Component Value Date  INR 1.2 (H) 03/15/2015  INR 1.2 (H)  01/26/2015  INR 1.3 (H) 12/12/2014  INR 1.2 (H) 12/27/2010  INR 1.3 (H) 12/20/2010  INR 1.1 11/24/2010  INR 1.0 11/19/2010   Assessment and Plan  Given the patient's EF 30% NYHA class III sxs on OMT, he is at an increased risk of sudden cardiac death and will likely benefit from an ICD. I spent some time with him reviewing the potential benefits and risks of the procedure. Specifically, I informed him that the potential risks include but are not limited to infection, bleeding, pneumothorax, and cardiac perforation with or without tamponade. He verbalized understanding, and he signed the consent form in my presence in clinic today. His procedure is scheduled for 12/12/2015. He knows to be n.p.o. after midnight the night before  his procedure. I gave him a scrub brush to use the morning of his procedure. We plan to implant a single coil dual-chamber ICD in the left infraclavicular region, and I plan not to do DFT testing. I plan to give him ancef 2grmas before his procedure. He knows to call us should he have any questions or problems at any time.

## 2015-12-12 NOTE — Transfer of Care (Signed)
Immediate Anesthesia Transfer of Care Note  Patient: [name]  Procedure(s) Performed: Procedure(s): DUAL LEAD PLACEMENT CARDIAC DIFIBRILLATOR (Left)  Patient Location: PACU  Anesthesia Type:General  Level of Consciousness: awake and alert   Airway & Oxygen Therapy: Patient Spontanous Breathing and Patient connected to nasal cannula oxygen  Post-op Assessment: Report given to RN  Post vital signs: Reviewed  Last Vitals:  Filed Vitals:   12/12/15 1217 12/12/15 1456  BP: 152/51 103/70  Pulse: 63 60  Temp: 37 C   Resp: 16 14    Complications: No apparent anesthesia complications

## 2015-12-12 NOTE — Anesthesia Postprocedure Evaluation (Signed)
Anesthesia Post Note  Patient: [name]  Procedure(s) Performed: Procedure(s) (LRB): DUAL LEAD PLACEMENT CARDIAC DIFIBRILLATOR (Left)  Patient location during evaluation: PACU Anesthesia Type: General Level of consciousness: awake and alert Pain management: pain level controlled Vital Signs Assessment: post-procedure vital signs reviewed and stable Respiratory status: spontaneous breathing, nonlabored ventilation, respiratory function stable and patient connected to nasal cannula oxygen Cardiovascular status: blood pressure returned to baseline and stable Postop Assessment: no signs of nausea or vomiting Anesthetic complications: no    Last Vitals:  Filed Vitals:   12/12/15 1918 12/12/15 1920  BP: 150/20 127/50  Pulse: 63 62  Temp: 36.7 C   Resp: 18     Last Pain: There were no vitals filed for this visit.               Precious Haws Annalie Wenner

## 2015-12-12 NOTE — Progress Notes (Signed)
Patient admitted to floor. No c/o pain. Left arm in immobilizer, dressing intact, site marked scant amount of serosanguinous fluid. Bleeding has spread slightly from previous marking, will continue to monitor closely. Telemetry verified. Skin verified by 2nd nurse doll RN

## 2015-12-13 ENCOUNTER — Ambulatory Visit: Payer: PPO

## 2015-12-13 DIAGNOSIS — J9811 Atelectasis: Secondary | ICD-10-CM | POA: Diagnosis not present

## 2015-12-13 DIAGNOSIS — Z950 Presence of cardiac pacemaker: Secondary | ICD-10-CM | POA: Diagnosis not present

## 2015-12-13 DIAGNOSIS — I5022 Chronic systolic (congestive) heart failure: Secondary | ICD-10-CM | POA: Diagnosis not present

## 2015-12-13 LAB — GLUCOSE, CAPILLARY
GLUCOSE-CAPILLARY: 89 mg/dL (ref 65–99)
GLUCOSE-CAPILLARY: 100 mg/dL — AB (ref 65–99)

## 2015-12-13 MED ORDER — HYDRALAZINE HCL 20 MG/ML IJ SOLN: 10.0000 mg | Freq: Four times a day (QID) | INTRAMUSCULAR | Status: DC | PRN

## 2015-12-13 MED ORDER — CEPHALEXIN 250 MG PO CAPS: 250.0000 mg | ORAL_CAPSULE | Freq: Four times a day (QID) | ORAL | Status: DC

## 2015-12-13 NOTE — Progress Notes (Signed)
Discharge instructions along with home medication list and follow up gone over with patient and wife. Both verbalized that they understood instructions. rx given to patient. Iv and telemetry removed. No distress no c/o pain. Dressing to left chest removed per md, steri strips intact with small amount dried blood. Patient to be discharged home, waiting on ride

## 2015-12-13 NOTE — Progress Notes (Addendum)
A&O. Up with one assist to BR. RA. Dressing to left upper chest intact with serosanguinous drainage visible. Arm immobilizer in place. No complaints. Fever of 100.5 this morning. Given tylenol. Will continue to assess and pass on to dayshift RN.

## 2015-12-14 ENCOUNTER — Encounter: Payer: Self-pay | Admitting: Cardiology

## 2015-12-16 DIAGNOSIS — E669 Obesity, unspecified: Secondary | ICD-10-CM | POA: Diagnosis not present

## 2015-12-16 DIAGNOSIS — R05 Cough: Secondary | ICD-10-CM | POA: Diagnosis not present

## 2015-12-16 DIAGNOSIS — Z95810 Presence of automatic (implantable) cardiac defibrillator: Secondary | ICD-10-CM | POA: Diagnosis not present

## 2015-12-16 DIAGNOSIS — I429 Cardiomyopathy, unspecified: Secondary | ICD-10-CM | POA: Diagnosis not present

## 2015-12-16 DIAGNOSIS — J40 Bronchitis, not specified as acute or chronic: Secondary | ICD-10-CM | POA: Diagnosis not present

## 2015-12-16 DIAGNOSIS — I252 Old myocardial infarction: Secondary | ICD-10-CM | POA: Diagnosis not present

## 2015-12-16 DIAGNOSIS — I10 Essential (primary) hypertension: Secondary | ICD-10-CM | POA: Diagnosis not present

## 2015-12-16 DIAGNOSIS — E7800 Pure hypercholesterolemia, unspecified: Secondary | ICD-10-CM | POA: Diagnosis not present

## 2015-12-16 DIAGNOSIS — R0602 Shortness of breath: Secondary | ICD-10-CM | POA: Diagnosis not present

## 2015-12-16 DIAGNOSIS — I2510 Atherosclerotic heart disease of native coronary artery without angina pectoris: Secondary | ICD-10-CM | POA: Diagnosis not present

## 2015-12-16 DIAGNOSIS — I5023 Acute on chronic systolic (congestive) heart failure: Secondary | ICD-10-CM | POA: Diagnosis not present

## 2015-12-18 DIAGNOSIS — I255 Ischemic cardiomyopathy: Secondary | ICD-10-CM | POA: Diagnosis not present

## 2015-12-18 DIAGNOSIS — I5022 Chronic systolic (congestive) heart failure: Secondary | ICD-10-CM | POA: Diagnosis not present

## 2015-12-18 DIAGNOSIS — E7800 Pure hypercholesterolemia, unspecified: Secondary | ICD-10-CM | POA: Diagnosis not present

## 2015-12-18 DIAGNOSIS — Z950 Presence of cardiac pacemaker: Secondary | ICD-10-CM | POA: Diagnosis not present

## 2015-12-18 DIAGNOSIS — R0602 Shortness of breath: Secondary | ICD-10-CM | POA: Diagnosis not present

## 2015-12-18 DIAGNOSIS — I252 Old myocardial infarction: Secondary | ICD-10-CM | POA: Diagnosis not present

## 2015-12-18 DIAGNOSIS — I10 Essential (primary) hypertension: Secondary | ICD-10-CM | POA: Diagnosis not present

## 2015-12-18 DIAGNOSIS — I2510 Atherosclerotic heart disease of native coronary artery without angina pectoris: Secondary | ICD-10-CM | POA: Diagnosis not present

## 2015-12-18 DIAGNOSIS — E119 Type 2 diabetes mellitus without complications: Secondary | ICD-10-CM | POA: Diagnosis not present

## 2015-12-18 DIAGNOSIS — R0609 Other forms of dyspnea: Secondary | ICD-10-CM | POA: Diagnosis not present

## 2015-12-18 DIAGNOSIS — J440 Chronic obstructive pulmonary disease with acute lower respiratory infection: Secondary | ICD-10-CM | POA: Diagnosis not present

## 2015-12-23 DIAGNOSIS — I252 Old myocardial infarction: Secondary | ICD-10-CM | POA: Diagnosis not present

## 2015-12-23 DIAGNOSIS — I5023 Acute on chronic systolic (congestive) heart failure: Secondary | ICD-10-CM | POA: Diagnosis not present

## 2015-12-23 DIAGNOSIS — I2510 Atherosclerotic heart disease of native coronary artery without angina pectoris: Secondary | ICD-10-CM | POA: Diagnosis not present

## 2015-12-23 DIAGNOSIS — I10 Essential (primary) hypertension: Secondary | ICD-10-CM | POA: Diagnosis not present

## 2015-12-31 DIAGNOSIS — K219 Gastro-esophageal reflux disease without esophagitis: Secondary | ICD-10-CM | POA: Diagnosis not present

## 2015-12-31 DIAGNOSIS — R109 Unspecified abdominal pain: Secondary | ICD-10-CM | POA: Diagnosis not present

## 2015-12-31 DIAGNOSIS — J449 Chronic obstructive pulmonary disease, unspecified: Secondary | ICD-10-CM | POA: Diagnosis not present

## 2015-12-31 DIAGNOSIS — I255 Ischemic cardiomyopathy: Secondary | ICD-10-CM | POA: Diagnosis not present

## 2015-12-31 DIAGNOSIS — G4733 Obstructive sleep apnea (adult) (pediatric): Secondary | ICD-10-CM | POA: Diagnosis not present

## 2015-12-31 DIAGNOSIS — R0602 Shortness of breath: Secondary | ICD-10-CM | POA: Diagnosis not present

## 2016-01-30 ENCOUNTER — Inpatient Hospital Stay
Admission: EM | Admit: 2016-01-30 | Discharge: 2016-02-02 | DRG: 280 | Disposition: A | Discharge status: Home / Self Care | Payer: PPO | Attending: Internal Medicine | Admitting: Internal Medicine

## 2016-01-30 DIAGNOSIS — I25790 Atherosclerosis of other coronary artery bypass graft(s) with unstable angina pectoris: Secondary | ICD-10-CM | POA: Diagnosis not present

## 2016-01-30 DIAGNOSIS — Z955 Presence of coronary angioplasty implant and graft: Secondary | ICD-10-CM

## 2016-01-30 DIAGNOSIS — I509 Heart failure, unspecified: Secondary | ICD-10-CM | POA: Diagnosis not present

## 2016-01-30 DIAGNOSIS — K219 Gastro-esophageal reflux disease without esophagitis: Secondary | ICD-10-CM | POA: Diagnosis present

## 2016-01-30 DIAGNOSIS — E7800 Pure hypercholesterolemia, unspecified: Secondary | ICD-10-CM | POA: Diagnosis not present

## 2016-01-30 DIAGNOSIS — I447 Left bundle-branch block, unspecified: Secondary | ICD-10-CM | POA: Diagnosis not present

## 2016-01-30 DIAGNOSIS — Z87891 Personal history of nicotine dependence: Secondary | ICD-10-CM

## 2016-01-30 DIAGNOSIS — I252 Old myocardial infarction: Secondary | ICD-10-CM

## 2016-01-30 DIAGNOSIS — Z8546 Personal history of malignant neoplasm of prostate: Secondary | ICD-10-CM | POA: Diagnosis not present

## 2016-01-30 DIAGNOSIS — Z6838 Body mass index (BMI) 38.0-38.9, adult: Secondary | ICD-10-CM | POA: Diagnosis not present

## 2016-01-30 DIAGNOSIS — I110 Hypertensive heart disease with heart failure: Secondary | ICD-10-CM | POA: Diagnosis not present

## 2016-01-30 DIAGNOSIS — J9600 Acute respiratory failure, unspecified whether with hypoxia or hypercapnia: Secondary | ICD-10-CM

## 2016-01-30 DIAGNOSIS — Z7982 Long term (current) use of aspirin: Secondary | ICD-10-CM | POA: Diagnosis not present

## 2016-01-30 DIAGNOSIS — E669 Obesity, unspecified: Secondary | ICD-10-CM | POA: Diagnosis present

## 2016-01-30 DIAGNOSIS — J810 Acute pulmonary edema: Secondary | ICD-10-CM

## 2016-01-30 DIAGNOSIS — Z95810 Presence of automatic (implantable) cardiac defibrillator: Secondary | ICD-10-CM

## 2016-01-30 DIAGNOSIS — Z8249 Family history of ischemic heart disease and other diseases of the circulatory system: Secondary | ICD-10-CM | POA: Diagnosis not present

## 2016-01-30 DIAGNOSIS — E119 Type 2 diabetes mellitus without complications: Secondary | ICD-10-CM | POA: Diagnosis not present

## 2016-01-30 DIAGNOSIS — I214 Non-ST elevation (NSTEMI) myocardial infarction: Principal | ICD-10-CM | POA: Diagnosis present

## 2016-01-30 DIAGNOSIS — J9601 Acute respiratory failure with hypoxia: Secondary | ICD-10-CM | POA: Diagnosis present

## 2016-01-30 DIAGNOSIS — Z951 Presence of aortocoronary bypass graft: Secondary | ICD-10-CM

## 2016-01-30 DIAGNOSIS — Z7902 Long term (current) use of antithrombotics/antiplatelets: Secondary | ICD-10-CM | POA: Diagnosis not present

## 2016-01-30 DIAGNOSIS — Z794 Long term (current) use of insulin: Secondary | ICD-10-CM | POA: Diagnosis not present

## 2016-01-30 DIAGNOSIS — I255 Ischemic cardiomyopathy: Secondary | ICD-10-CM | POA: Diagnosis not present

## 2016-01-30 DIAGNOSIS — G4733 Obstructive sleep apnea (adult) (pediatric): Secondary | ICD-10-CM | POA: Diagnosis not present

## 2016-01-30 DIAGNOSIS — I472 Ventricular tachycardia: Secondary | ICD-10-CM | POA: Diagnosis not present

## 2016-01-30 DIAGNOSIS — J80 Acute respiratory distress syndrome: Secondary | ICD-10-CM | POA: Diagnosis not present

## 2016-01-30 DIAGNOSIS — I5023 Acute on chronic systolic (congestive) heart failure: Secondary | ICD-10-CM | POA: Diagnosis not present

## 2016-01-30 DIAGNOSIS — R7989 Other specified abnormal findings of blood chemistry: Secondary | ICD-10-CM | POA: Diagnosis not present

## 2016-01-30 DIAGNOSIS — Z881 Allergy status to other antibiotic agents status: Secondary | ICD-10-CM | POA: Diagnosis not present

## 2016-01-30 DIAGNOSIS — J449 Chronic obstructive pulmonary disease, unspecified: Secondary | ICD-10-CM | POA: Diagnosis not present

## 2016-01-30 DIAGNOSIS — Z820 Family history of epilepsy and other diseases of the nervous system: Secondary | ICD-10-CM

## 2016-01-30 DIAGNOSIS — I10 Essential (primary) hypertension: Secondary | ICD-10-CM | POA: Diagnosis not present

## 2016-01-30 NOTE — ED Provider Notes (Signed)
[facility] Emergency Department Provider Note  ____________________________________________  Time seen: Approximately 11:59 PM  I have reviewed the triage vital signs and the nursing notes.   HISTORY  Chief Complaint Respiratory Distress  The patient's self-reported history is limited due to severe respiratory distress/critical illness  HPI [name] is a 70 y.o. male with a medical history that includes COPD, CHF, insulin-dependent diabetes, attention, and obesity who presents in severe respiratory distress.  His family reports that it was acute in onset within the last couple of hours.  He went to bed feeling normal and then woke up gasping for breath.  He is using accessory muscles, is diaphoretic, and is speaking in short phrases.  His family reports that this kind of thing has happened to him in the past.  He was using his CPAP at night but it was not helping and he is begging for a BiPAP.  He denies any pain, just the severe difficulty in breathing.  He denies fever/chills, chest pain, abdominal pain, nausea, vomiting, dysuria.  Nothing is making his breathing better and is getting worse rapidly over time.   Past Medical History  Diagnosis Date  . Anemia   . Cancer ([place]) 12/2013    prostate  . Shortness of breath dyspnea   . COPD (chronic obstructive pulmonary disease) ([place])   . 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 Diabetes mellitus without complication ([place])   . Hypertension   . Hypercholesteremia   . CHF (congestive heart failure) ([place])   . Cardiomyopathy, ischemic   . GERD (gastroesophageal reflux disease)   . Myocardial infarction [facility]) [phone]    Patient Active Problem List   Diagnosis Date Noted  . Chronic systolic HF (heart failure) ([place]) 12/12/2015  . Chronic systolic heart failure ([place]) 12/12/2015    Past Surgical History  Procedure Laterality Date  . Coronary angioplasty with stent placement    . Coronary  artery bypass graft  11/24/2010  . Tonsillectomy    . Implantable cardioverter defibrillator (icd) generator change Left 12/12/2015    Procedure: DUAL LEAD PLACEMENT CARDIAC DIFIBRILLATOR;  Surgeon: [name], MD;  Location: [facility] ORS;  Service: Cardiovascular;  Laterality: Left;    Current Outpatient Rx  Name  Route  Sig  Dispense  Refill  . albuterol (PROVENTIL HFA;VENTOLIN HFA) 108 (90 Base) MCG/ACT inhaler   Inhalation   Inhale 2 puffs into the lungs 3 (three) times daily as needed for wheezing or shortness of breath.         Marland Kitchen aspirin 81 MG tablet   Oral   Take 81 mg by mouth every morning.         Marland Kitchen atorvastatin (LIPITOR) 40 MG tablet   Oral   Take 40 mg by mouth every evening.         . budesonide-formoterol (SYMBICORT) 80-4.5 MCG/ACT inhaler   Inhalation   Inhale 2 puffs into the lungs 2 (two) times daily.         . clopidogrel (PLAVIX) 75 MG tablet   Oral   Take 75 mg by mouth every morning.         Marland Kitchen dexlansoprazole (DEXILANT) 60 MG capsule   Oral   Take 60 mg by mouth daily.         . ferrous sulfate 325 (65 FE) MG tablet   Oral   Take 325 mg by mouth 2 (two) times daily with a meal.         .  finasteride (PROSCAR) 5 MG tablet   Oral   Take 5 mg by mouth at bedtime.         Marland Kitchen FLUoxetine (PROZAC) 20 MG tablet   Oral   Take 20 mg by mouth every morning.         . furosemide (LASIX) 40 MG tablet   Oral   Take 40 mg by mouth every morning.         . gabapentin (NEURONTIN) 300 MG capsule   Oral   Take 600 mg by mouth 3 (three) times daily.         . insulin glargine (LANTUS) 100 UNIT/ML injection   Subcutaneous   Inject 90 Units into the skin at bedtime.         . insulin lispro (HUMALOG) 100 UNIT/ML injection   Subcutaneous   Inject into the skin 3 (three) times daily before meals. Sliding scale based on blood sugar results, 8-14 units.         . isosorbide mononitrate (IMDUR) 60 MG 24 hr tablet   Oral   Take 60 mg by  mouth 2 (two) times daily.         Marland Kitchen lisinopril (PRINIVIL,ZESTRIL) 30 MG tablet   Oral   Take 30 mg by mouth every morning.         . loratadine (CLARITIN) 10 MG tablet   Oral   Take 10 mg by mouth daily.         . metFORMIN (GLUCOPHAGE) 1000 MG tablet   Oral   Take 1,000 mg by mouth 2 (two) times daily with a meal.          . metoprolol (LOPRESSOR) 50 MG tablet   Oral   Take 50 mg by mouth 2 (two) times daily.         . Multiple Vitamins-Minerals (CENTRUM SILVER PO)   Oral   Take 1 tablet by mouth every morning.         . ranolazine (RANEXA) 500 MG 12 hr tablet   Oral   Take 500 mg by mouth 2 (two) times daily.         Marland Kitchen spironolactone (ALDACTONE) 25 MG tablet   Oral   Take 25 mg by mouth every morning.         . tamsulosin (FLOMAX) 0.4 MG CAPS capsule   Oral   Take 0.4 mg by mouth daily after supper.         . cephALEXin (KEFLEX) 250 MG capsule   Oral   Take 1 capsule (250 mg total) by mouth 4 (four) times daily. Patient not taking: Reported on 01/31/2016   28 capsule   0   . nitroGLYCERIN (NITROSTAT) 0.4 MG SL tablet   Sublingual   Place 0.4 mg under the tongue every 5 (five) minutes as needed for chest pain.           Allergies Benadryl; Doxycycline; and Lopid  History reviewed. No pertinent family history.  Social History Social History  Substance Use Topics  . Smoking status: Former Smoker    Quit date: 12/03/1996  . Smokeless tobacco: None  . Alcohol Use: No    Review of Systems Constitutional: No fever/chills Eyes: No visual changes. ENT: No sore throat. Cardiovascular: Denies chest pain. Respiratory: Severe difficulty breathing with accessory muscle usage Gastrointestinal: No abdominal pain.  No nausea, no vomiting.  No diarrhea.  No constipation. Genitourinary: Negative for dysuria. Musculoskeletal: Negative for back pain. Skin: Negative for rash.  Neurological: Negative for headaches, focal weakness or  numbness.  10-point ROS otherwise negative.  ____________________________________________   PHYSICAL EXAM:  ED Triage Vitals  Enc Vitals Group     BP 01/31/16 0008 161/78 mmHg     Pulse Rate 01/31/16 0000 120     Resp 01/31/16 0000 36     Temp 01/31/16 0005 97.5 F (36.4 C)     Temp Source 01/31/16 0005 Axillary     SpO2 01/31/16 0000 100 %     Weight 01/31/16 0000 252 lb (114.306 kg)     Height 01/31/16 0000 5\' 8"  (1.727 m)     Head Cir --      Peak Flow --      Pain Score --      Pain Loc --      Pain Edu? --      Excl. in [place]? --     Constitutional: Alert and oriented. Severe respiratory distress. Eyes: Conjunctivae are normal. PERRL. EOMI. Head: Atraumatic. Nose: No congestion/rhinnorhea. Mouth/Throat: Mucous membranes are moist.  Oropharynx non-erythematous. Neck: No stridor.   Cardiovascular: Normal rate, regular rhythm. Grossly normal heart sounds.  Good peripheral circulation. Respiratory: Normal respiratory effort.  No retractions. Lungs CTAB. Gastrointestinal: Soft and nontender. No distention. No abdominal bruits. No CVA tenderness. Musculoskeletal: No lower extremity tenderness nor edema.  No joint effusions. Neurologic:  Normal speech and language. No gross focal neurologic deficits are appreciated.  Skin:  Skin is warm, diaphoretic and intact. No rash noted.  Patient is pale   ____________________________________________   LABS (all labs ordered are listed, but only abnormal results are displayed)  Labs Reviewed  COMPREHENSIVE METABOLIC PANEL - Abnormal; Notable for the following:    Potassium 3.2 (*)    Glucose, Bld 156 (*)    BUN 25 (*)    Creatinine, Ser 1.35 (*)    Total Protein 8.3 (*)    GFR calc non Af Amer 52 (*)    GFR calc Af Amer 60 (*)    All other components within normal limits  MAGNESIUM - Abnormal; Notable for the following:    Magnesium 1.3 (*)    All other components within normal limits  BRAIN NATRIURETIC PEPTIDE - Abnormal;  Notable for the following:    B Natriuretic Peptide 755.0 (*)    All other components within normal limits  TROPONIN I - Abnormal; Notable for the following:    Troponin I 0.18 (*)    All other components within normal limits  PROTIME-INR - Abnormal; Notable for the following:    Prothrombin Time 15.1 (*)    All other components within normal limits  BLOOD GAS, VENOUS - Abnormal; Notable for the following:    pCO2, Ven 35 (*)    Bicarbonate 20.2 (*)    Acid-base deficit 4.4 (*)    All other components within normal limits  APTT  URINALYSIS COMPLETEWITH MICROSCOPIC ([facility] ONLY)   ____________________________________________  EKG  ED ECG REPORT I, Umi Mainor, the attending physician, personally viewed and interpreted this ECG.   Date: 01/31/2016  EKG Time: 00:01  Rate: 120  Rhythm: Atrial paced complexes with left bundle branch block  Axis: Normal  Intervals:left bundle branch block  ST&T Change: Non-specific ST segment / T-wave changes, but no evidence of acute ischemia.  Artifact is present and patient is in severe respiratory distress making a good ECG difficulty to obtain  ____________________________________________  RADIOLOGY   Dg Chest Portable 1 View  01/31/2016  CLINICAL DATA:  Respiratory distress tonight. Severe shortness of breath. History of CHF pulmonary edema. EXAM: PORTABLE CHEST 1 VIEW COMPARISON:  12/13/2015 FINDINGS: Left-sided pacemaker remains in place. Patient is post median sternotomy. Heart is mildly enlarged. Perihilar vascular haziness concerning for pulmonary edema. No large pleural effusion, left lower costophrenic angle excluded from the field of view. Suspect trace fluid in the right minor fissure. Bibasilar subsegmental atelectasis without confluent airspace disease. No pneumothorax. IMPRESSION: Cardiomegaly with perihilar pulmonary edema. Electronically Signed   By: Jeb Levering M.D.   On: 01/31/2016 01:07     ____________________________________________   PROCEDURES  Procedure(s) performed: None  Critical Care performed: Yes, see critical care note(s)  CRITICAL CARE Performed by: Hinda Kehr   Total critical care time: 45 minutes  Critical care time was exclusive of separately billable procedures and treating other patients.  Critical care was necessary to treat or prevent imminent or life-threatening deterioration.  Critical care was time spent personally by me on the following activities: development of treatment plan with patient and/or surrogate as well as nursing, discussions with consultants, evaluation of patient's response to treatment, examination of patient, obtaining history from patient or surrogate, ordering and performing treatments and interventions, ordering and review of laboratory studies, ordering and review of radiographic studies, pulse oximetry and re-evaluation of patient's condition.  ____________________________________________    INITIAL IMPRESSION / ASSESSMENT AND PLAN / ED COURSE  Pertinent labs & imaging results that were available during my care of the patient were reviewed by me and considered in my medical decision making (see chart for details).  The patient is in severe respiratory distress and we immediately called RT to start him on BiPAP.  Additionally ordered a portable chest x-ray to evaluate his fluid status but his lungs sound thick and full of fluid.  I have Lasix 40 mg IV at the ready wants to see his chest x-ray because I first would like to rule out possibility of a large pneumonia.  The measures and will help him most immediately are the BiPAP and the 1 inch of nitroglycerin paste that we are putting on his chest now.  After obtaining a second IV if he is not immediately improving I will start a nitroglycerin drip.  His blood pressure initially is in the 123456 systolic so he should be able to handle the nitroglycerin.  I will reassess  frequently.  His presentation is most consistent with flash pulmonary edema.  I am also treating with do an abscess in line with the BiPAP given his history of COPD; hopefully this will further help his breathing status.  ----------------------------------------- 1:16 AM on 01/31/2016 -----------------------------------------  Checked on the patient several more times, he started improving rapidly with the BiPAP.  His troponin is significantly positive for this just happening recently.  The question is whether this is a stress leak from the acute pulmonary edema or whether a mild infarction caused the pulmonary edema.  I erred on the side of caution by ordering heparin per NSTEMI protocol.  I also gave the patient a full dose aspirin.  I updated him and his family.  He is feeling much better on the BiPAP and the nitroglycerin ointment and he has gotten Lasix 40 mg IV.  I have spoken with the hospitalist who will admit. ____________________________________________  FINAL CLINICAL IMPRESSION(S) / ED DIAGNOSES  Final diagnoses:  Acute congestive heart failure, unspecified congestive heart failure type ([place])  Acute respiratory failure, unspecified whether with hypoxia or hypercapnia ([facility])  NSTEMI (non-ST elevated myocardial  infarction) ([place])  Acute pulmonary edema ([facility])      NEW MEDICATIONS STARTED DURING THIS VISIT:  New Prescriptions   No medications on file      Please note that this document was prepared using voice recognition software and may include unintentional dictation errors.   Hinda Kehr, MD 01/31/16 [phone]

## 2016-01-30 NOTE — ED Notes (Signed)
Pt c/o respiratory distress. Pt denies chest pain. Pt has hx of pacemaker, placed 2-3 months ago. Pt hx of COPD.

## 2016-01-31 ENCOUNTER — Emergency Department: Payer: PPO

## 2016-01-31 DIAGNOSIS — I214 Non-ST elevation (NSTEMI) myocardial infarction: Secondary | ICD-10-CM | POA: Diagnosis not present

## 2016-01-31 DIAGNOSIS — Z95810 Presence of automatic (implantable) cardiac defibrillator: Secondary | ICD-10-CM | POA: Diagnosis not present

## 2016-01-31 DIAGNOSIS — Z8249 Family history of ischemic heart disease and other diseases of the circulatory system: Secondary | ICD-10-CM | POA: Diagnosis not present

## 2016-01-31 DIAGNOSIS — I5023 Acute on chronic systolic (congestive) heart failure: Secondary | ICD-10-CM | POA: Diagnosis not present

## 2016-01-31 DIAGNOSIS — Z8546 Personal history of malignant neoplasm of prostate: Secondary | ICD-10-CM | POA: Diagnosis not present

## 2016-01-31 DIAGNOSIS — Z7982 Long term (current) use of aspirin: Secondary | ICD-10-CM | POA: Diagnosis not present

## 2016-01-31 DIAGNOSIS — Z794 Long term (current) use of insulin: Secondary | ICD-10-CM | POA: Diagnosis not present

## 2016-01-31 DIAGNOSIS — I110 Hypertensive heart disease with heart failure: Secondary | ICD-10-CM | POA: Diagnosis not present

## 2016-01-31 DIAGNOSIS — J810 Acute pulmonary edema: Secondary | ICD-10-CM | POA: Diagnosis not present

## 2016-01-31 DIAGNOSIS — G4733 Obstructive sleep apnea (adult) (pediatric): Secondary | ICD-10-CM | POA: Diagnosis not present

## 2016-01-31 DIAGNOSIS — I252 Old myocardial infarction: Secondary | ICD-10-CM | POA: Diagnosis not present

## 2016-01-31 DIAGNOSIS — Z6838 Body mass index (BMI) 38.0-38.9, adult: Secondary | ICD-10-CM | POA: Diagnosis not present

## 2016-01-31 DIAGNOSIS — I509 Heart failure, unspecified: Secondary | ICD-10-CM

## 2016-01-31 DIAGNOSIS — I25790 Atherosclerosis of other coronary artery bypass graft(s) with unstable angina pectoris: Secondary | ICD-10-CM | POA: Diagnosis not present

## 2016-01-31 DIAGNOSIS — I10 Essential (primary) hypertension: Secondary | ICD-10-CM | POA: Diagnosis not present

## 2016-01-31 DIAGNOSIS — Z820 Family history of epilepsy and other diseases of the nervous system: Secondary | ICD-10-CM | POA: Diagnosis not present

## 2016-01-31 DIAGNOSIS — E7800 Pure hypercholesterolemia, unspecified: Secondary | ICD-10-CM | POA: Diagnosis not present

## 2016-01-31 DIAGNOSIS — Z881 Allergy status to other antibiotic agents status: Secondary | ICD-10-CM | POA: Diagnosis not present

## 2016-01-31 DIAGNOSIS — E669 Obesity, unspecified: Secondary | ICD-10-CM | POA: Diagnosis not present

## 2016-01-31 DIAGNOSIS — I255 Ischemic cardiomyopathy: Secondary | ICD-10-CM | POA: Diagnosis not present

## 2016-01-31 DIAGNOSIS — J9601 Acute respiratory failure with hypoxia: Secondary | ICD-10-CM | POA: Diagnosis not present

## 2016-01-31 DIAGNOSIS — K219 Gastro-esophageal reflux disease without esophagitis: Secondary | ICD-10-CM | POA: Diagnosis not present

## 2016-01-31 DIAGNOSIS — E119 Type 2 diabetes mellitus without complications: Secondary | ICD-10-CM | POA: Diagnosis not present

## 2016-01-31 DIAGNOSIS — Z87891 Personal history of nicotine dependence: Secondary | ICD-10-CM | POA: Diagnosis not present

## 2016-01-31 DIAGNOSIS — I447 Left bundle-branch block, unspecified: Secondary | ICD-10-CM | POA: Diagnosis not present

## 2016-01-31 DIAGNOSIS — I472 Ventricular tachycardia: Secondary | ICD-10-CM | POA: Diagnosis not present

## 2016-01-31 DIAGNOSIS — Z955 Presence of coronary angioplasty implant and graft: Secondary | ICD-10-CM | POA: Diagnosis not present

## 2016-01-31 DIAGNOSIS — J80 Acute respiratory distress syndrome: Secondary | ICD-10-CM | POA: Diagnosis not present

## 2016-01-31 DIAGNOSIS — Z7902 Long term (current) use of antithrombotics/antiplatelets: Secondary | ICD-10-CM | POA: Diagnosis not present

## 2016-01-31 DIAGNOSIS — Z951 Presence of aortocoronary bypass graft: Secondary | ICD-10-CM | POA: Diagnosis not present

## 2016-01-31 DIAGNOSIS — J449 Chronic obstructive pulmonary disease, unspecified: Secondary | ICD-10-CM | POA: Diagnosis not present

## 2016-01-31 LAB — GLUCOSE, CAPILLARY
GLUCOSE-CAPILLARY: 115 mg/dL — AB (ref 65–99)
Glucose-Capillary: 46 mg/dL — ABNORMAL LOW (ref 65–99)
Glucose-Capillary: 108 mg/dL — ABNORMAL HIGH (ref 65–99)
Glucose-Capillary: 67 mg/dL (ref 65–99)

## 2016-01-31 LAB — URINALYSIS COMPLETE WITH MICROSCOPIC (ARMC ONLY)
BACTERIA UA: NONE SEEN
BILIRUBIN URINE: NEGATIVE
Glucose, UA: NEGATIVE mg/dL
Hgb urine dipstick: NEGATIVE
Ketones, ur: NEGATIVE mg/dL
Leukocytes, UA: NEGATIVE
Nitrite: NEGATIVE
PH: 5 (ref 5.0–8.0)
PROTEIN: NEGATIVE mg/dL
RBC / HPF: NONE SEEN RBC/hpf (ref 0–5)
Specific Gravity, Urine: 1.01 (ref 1.005–1.030)

## 2016-01-31 LAB — PROTIME-INR
INR: 1.17
PROTHROMBIN TIME: 15.1 s — AB (ref 11.4–15.0)

## 2016-01-31 LAB — BLOOD GAS, VENOUS
Acid-base deficit: 4.4 mmol/L — ABNORMAL HIGH (ref 0.0–2.0)
Bicarbonate: 20.2 mEq/L — ABNORMAL LOW (ref 21.0–28.0)
DELIVERY SYSTEMS: POSITIVE
FIO2: 0.35
O2 SAT: 92.4 %
PATIENT TEMPERATURE: 37
pCO2, Ven: 35 mmHg — ABNORMAL LOW (ref 44.0–60.0)
pH, Ven: 7.37 (ref 7.320–7.430)

## 2016-01-31 LAB — COMPREHENSIVE METABOLIC PANEL
ALBUMIN: 4.1 g/dL (ref 3.5–5.0)
ALT: 25 U/L (ref 17–63)
AST: 31 U/L (ref 15–41)
Alkaline Phosphatase: 70 U/L (ref 38–126)
Anion gap: 12 (ref 5–15)
BUN: 25 mg/dL — AB (ref 6–20)
CHLORIDE: 105 mmol/L (ref 101–111)
CO2: 23 mmol/L (ref 22–32)
CREATININE: 1.35 mg/dL — AB (ref 0.61–1.24)
Calcium: 9.2 mg/dL (ref 8.9–10.3)
GFR calc Af Amer: 60 mL/min — ABNORMAL LOW (ref >60)
GFR, EST NON AFRICAN AMERICAN: 52 mL/min — AB (ref >60)
Glucose, Bld: 156 mg/dL — ABNORMAL HIGH (ref 65–99)
POTASSIUM: 3.2 mmol/L — AB (ref 3.5–5.1)
SODIUM: 140 mmol/L (ref 135–145)
Total Bilirubin: 0.6 mg/dL (ref 0.3–1.2)
Total Protein: 8.3 g/dL — ABNORMAL HIGH (ref 6.5–8.1)

## 2016-01-31 LAB — APTT: APTT: 26 s (ref 24–36)

## 2016-01-31 LAB — CBC
HEMATOCRIT: 35.3 % — AB (ref 40.0–52.0)
HEMOGLOBIN: 11.6 g/dL — AB (ref 13.0–18.0)
MCH: 29.9 pg (ref 26.0–34.0)
MCHC: 33 g/dL (ref 32.0–36.0)
MCV: 90.6 fL (ref 80.0–100.0)
Platelets: 245 10*3/uL (ref 150–440)
RBC: 3.89 MIL/uL — AB (ref 4.40–5.90)
RDW: 15.9 % — ABNORMAL HIGH (ref 11.5–14.5)
WBC: 14.4 10*3/uL — AB (ref 3.8–10.6)

## 2016-01-31 LAB — TROPONIN I
TROPONIN I: 2.48 ng/mL — AB (ref <0.031)
Troponin I: 0.18 ng/mL — ABNORMAL HIGH (ref <0.031)
Troponin I: 1.87 ng/mL — ABNORMAL HIGH (ref <0.031)

## 2016-01-31 LAB — BASIC METABOLIC PANEL
ANION GAP: 8 (ref 5–15)
BUN: 23 mg/dL — AB (ref 6–20)
CO2: 23 mmol/L (ref 22–32)
Calcium: 8.8 mg/dL — ABNORMAL LOW (ref 8.9–10.3)
Chloride: 107 mmol/L (ref 101–111)
Creatinine, Ser: 1.18 mg/dL (ref 0.61–1.24)
Glucose, Bld: 71 mg/dL (ref 65–99)
POTASSIUM: 3.7 mmol/L (ref 3.5–5.1)
SODIUM: 138 mmol/L (ref 135–145)

## 2016-01-31 LAB — MRSA PCR SCREENING: MRSA BY PCR: NEGATIVE

## 2016-01-31 LAB — BRAIN NATRIURETIC PEPTIDE: B Natriuretic Peptide: 755 pg/mL — ABNORMAL HIGH (ref 0.0–100.0)

## 2016-01-31 LAB — HEPARIN LEVEL (UNFRACTIONATED)
HEPARIN UNFRACTIONATED: 0.39 [IU]/mL (ref 0.30–0.70)
Heparin Unfractionated: 0.48 IU/mL (ref 0.30–0.70)

## 2016-01-31 LAB — MAGNESIUM: MAGNESIUM: 1.3 mg/dL — AB (ref 1.7–2.4)

## 2016-01-31 MED ORDER — ACETAMINOPHEN 650 MG RE SUPP: 650.0000 mg | Freq: Four times a day (QID) | RECTAL | Status: DC | PRN

## 2016-01-31 MED ORDER — INSULIN ASPART 100 UNIT/ML ~~LOC~~ SOLN: 0.0000 [IU] | Freq: Every day | SUBCUTANEOUS | Status: DC

## 2016-01-31 MED ORDER — ASPIRIN 81 MG PO CHEW
81.0000 mg | CHEWABLE_TABLET | ORAL | Status: DC
  Administered 2016-01-31 – 2016-02-01 (×2): 81 mg via ORAL
  Filled 2016-01-31 (×3): qty 1

## 2016-01-31 MED ORDER — MOMETASONE FURO-FORMOTEROL FUM 100-5 MCG/ACT IN AERO
2.0000 | INHALATION_SPRAY | Freq: Two times a day (BID) | RESPIRATORY_TRACT | Status: DC
  Administered 2016-01-31 – 2016-02-01 (×4): 2 via RESPIRATORY_TRACT
  Filled 2016-01-31: qty 8.8

## 2016-01-31 MED ORDER — SODIUM CHLORIDE 0.9% FLUSH: 3.0000 mL | INTRAVENOUS | Status: DC | PRN

## 2016-01-31 MED ORDER — ACETAMINOPHEN 325 MG PO TABS: 650.0000 mg | ORAL_TABLET | Freq: Four times a day (QID) | ORAL | Status: DC | PRN

## 2016-01-31 MED ORDER — INSULIN ASPART 100 UNIT/ML ~~LOC~~ SOLN
8.0000 [IU] | Freq: Three times a day (TID) | SUBCUTANEOUS | Status: DC
  Administered 2016-02-02: 8 [IU] via SUBCUTANEOUS
  Filled 2016-01-31: qty 8

## 2016-01-31 MED ORDER — FINASTERIDE 5 MG PO TABS
5.0000 mg | ORAL_TABLET | Freq: Every day | ORAL | Status: DC
Start: 2016-02-01 — End: 2016-02-02
  Administered 2016-02-01: 5 mg via ORAL
  Filled 2016-01-31 (×2): qty 1

## 2016-01-31 MED ORDER — ONDANSETRON HCL 4 MG PO TABS: 4.0000 mg | ORAL_TABLET | Freq: Four times a day (QID) | ORAL | Status: DC | PRN

## 2016-01-31 MED ORDER — INSULIN ASPART 100 UNIT/ML ~~LOC~~ SOLN: 0.0000 [IU] | Freq: Three times a day (TID) | SUBCUTANEOUS | Status: DC

## 2016-01-31 MED ORDER — HEPARIN BOLUS VIA INFUSION
4000.0000 [IU] | Freq: Once | INTRAVENOUS | Status: AC
  Administered 2016-01-31: 4000 [IU] via INTRAVENOUS
  Filled 2016-01-31: qty 4000

## 2016-01-31 MED ORDER — ATORVASTATIN CALCIUM 20 MG PO TABS
40.0000 mg | ORAL_TABLET | Freq: Every evening | ORAL | Status: DC
  Administered 2016-01-31 – 2016-02-01 (×2): 40 mg via ORAL
  Filled 2016-01-31 (×2): qty 2

## 2016-01-31 MED ORDER — NITROGLYCERIN 2 % TD OINT
TOPICAL_OINTMENT | TRANSDERMAL | Status: AC
  Administered 2016-01-31: 1 [in_us] via TOPICAL
  Filled 2016-01-31: qty 1

## 2016-01-31 MED ORDER — LORATADINE 10 MG PO TABS
10.0000 mg | ORAL_TABLET | Freq: Every day | ORAL | Status: DC
  Administered 2016-01-31 – 2016-02-02 (×3): 10 mg via ORAL
  Filled 2016-01-31 (×3): qty 1

## 2016-01-31 MED ORDER — METOPROLOL TARTRATE 50 MG PO TABS
50.0000 mg | ORAL_TABLET | Freq: Two times a day (BID) | ORAL | Status: DC
  Administered 2016-01-31 – 2016-02-01 (×3): 50 mg via ORAL
  Filled 2016-01-31 (×5): qty 1

## 2016-01-31 MED ORDER — SODIUM CHLORIDE 0.9% FLUSH
3.0000 mL | Freq: Two times a day (BID) | INTRAVENOUS | Status: DC
  Administered 2016-01-31 – 2016-02-01 (×3): 3 mL via INTRAVENOUS

## 2016-01-31 MED ORDER — INSULIN GLARGINE 100 UNIT/ML ~~LOC~~ SOLN
75.0000 [IU] | Freq: Every day | SUBCUTANEOUS | Status: DC
Start: 2016-01-31 — End: 2016-02-02
  Filled 2016-01-31 (×3): qty 0.75

## 2016-01-31 MED ORDER — FUROSEMIDE 10 MG/ML IJ SOLN
40.0000 mg | Freq: Once | INTRAMUSCULAR | Status: AC
  Administered 2016-01-31: 40 mg via INTRAVENOUS
  Filled 2016-01-31: qty 4

## 2016-01-31 MED ORDER — METFORMIN HCL 500 MG PO TABS
1000.0000 mg | ORAL_TABLET | Freq: Two times a day (BID) | ORAL | Status: DC
  Administered 2016-01-31 – 2016-02-01 (×4): 1000 mg via ORAL
  Filled 2016-01-31 (×4): qty 2

## 2016-01-31 MED ORDER — HEPARIN (PORCINE) IN NACL 100-0.45 UNIT/ML-% IJ SOLN
1450.0000 [IU]/h | INTRAMUSCULAR | Status: DC
  Administered 2016-01-31 – 2016-02-01 (×2): 1250 [IU]/h via INTRAVENOUS
  Filled 2016-01-31 (×4): qty 250

## 2016-01-31 MED ORDER — CETYLPYRIDINIUM CHLORIDE 0.05 % MT LIQD
7.0000 mL | Freq: Two times a day (BID) | OROMUCOSAL | Status: DC
  Administered 2016-01-31 – 2016-02-02 (×5): 7 mL via OROMUCOSAL

## 2016-01-31 MED ORDER — NITROGLYCERIN 2 % TD OINT
1.0000 [in_us] | TOPICAL_OINTMENT | Freq: Once | TRANSDERMAL | Status: AC
  Administered 2016-01-31 (×2): 1 [in_us] via TOPICAL

## 2016-01-31 MED ORDER — SODIUM CHLORIDE 0.9 % IV SOLN: 250.0000 mL | INTRAVENOUS | Status: DC | PRN

## 2016-01-31 MED ORDER — IPRATROPIUM-ALBUTEROL 0.5-2.5 (3) MG/3ML IN SOLN
3.0000 mL | RESPIRATORY_TRACT | Status: DC | PRN
  Administered 2016-02-01: 3 mL via RESPIRATORY_TRACT
  Filled 2016-01-31: qty 3

## 2016-01-31 MED ORDER — LISINOPRIL 20 MG PO TABS
30.0000 mg | ORAL_TABLET | ORAL | Status: DC
  Administered 2016-01-31: 30 mg via ORAL
  Filled 2016-01-31: qty 1

## 2016-01-31 MED ORDER — RANOLAZINE ER 500 MG PO TB12
500.0000 mg | ORAL_TABLET | Freq: Two times a day (BID) | ORAL | Status: DC
  Administered 2016-01-31 – 2016-02-02 (×5): 500 mg via ORAL
  Filled 2016-01-31 (×6): qty 1

## 2016-01-31 MED ORDER — TAMSULOSIN HCL 0.4 MG PO CAPS
0.4000 mg | ORAL_CAPSULE | Freq: Every day | ORAL | Status: DC
Start: 2016-01-31 — End: 2016-02-02
  Administered 2016-01-31 – 2016-02-02 (×3): 0.4 mg via ORAL
  Filled 2016-01-31 (×3): qty 1

## 2016-01-31 MED ORDER — FLUOXETINE HCL 20 MG PO CAPS
20.0000 mg | ORAL_CAPSULE | ORAL | Status: DC
  Administered 2016-01-31 – 2016-02-02 (×4): 20 mg via ORAL
  Filled 2016-01-31 (×6): qty 1

## 2016-01-31 MED ORDER — ISOSORBIDE MONONITRATE ER 60 MG PO TB24
60.0000 mg | ORAL_TABLET | Freq: Two times a day (BID) | ORAL | Status: DC
  Administered 2016-01-31 – 2016-02-01 (×3): 60 mg via ORAL
  Filled 2016-01-31 (×3): qty 1
  Filled 2016-01-31: qty 2
  Filled 2016-01-31: qty 1

## 2016-01-31 MED ORDER — IPRATROPIUM-ALBUTEROL 0.5-2.5 (3) MG/3ML IN SOLN
9.0000 mL | Freq: Once | RESPIRATORY_TRACT | Status: AC
  Administered 2016-01-31: 9 mL via RESPIRATORY_TRACT
  Filled 2016-01-31: qty 9

## 2016-01-31 MED ORDER — SPIRONOLACTONE 25 MG PO TABS
25.0000 mg | ORAL_TABLET | ORAL | Status: DC
  Administered 2016-01-31 – 2016-02-02 (×5): 25 mg via ORAL
  Filled 2016-01-31 (×4): qty 1

## 2016-01-31 MED ORDER — PANTOPRAZOLE SODIUM 40 MG PO TBEC
40.0000 mg | DELAYED_RELEASE_TABLET | Freq: Every day | ORAL | Status: DC
  Administered 2016-01-31 – 2016-02-02 (×3): 40 mg via ORAL
  Filled 2016-01-31 (×3): qty 1

## 2016-01-31 MED ORDER — CHLORHEXIDINE GLUCONATE 0.12 % MT SOLN
15.0000 mL | Freq: Two times a day (BID) | OROMUCOSAL | Status: DC
  Administered 2016-02-01 (×2): 15 mL via OROMUCOSAL
  Filled 2016-01-31 (×4): qty 15

## 2016-01-31 MED ORDER — SODIUM CHLORIDE 0.9% FLUSH
3.0000 mL | Freq: Two times a day (BID) | INTRAVENOUS | Status: DC
  Administered 2016-01-31 – 2016-02-01 (×4): 3 mL via INTRAVENOUS

## 2016-01-31 MED ORDER — FUROSEMIDE 10 MG/ML IJ SOLN
60.0000 mg | Freq: Two times a day (BID) | INTRAMUSCULAR | Status: DC
  Administered 2016-01-31 – 2016-02-02 (×5): 60 mg via INTRAVENOUS
  Filled 2016-01-31 (×5): qty 6

## 2016-01-31 MED ORDER — POLYETHYLENE GLYCOL 3350 17 G PO PACK: 17.0000 g | PACK | Freq: Every day | ORAL | Status: DC | PRN

## 2016-01-31 MED ORDER — GABAPENTIN 300 MG PO CAPS
600.0000 mg | ORAL_CAPSULE | Freq: Three times a day (TID) | ORAL | Status: DC
  Administered 2016-01-31 – 2016-02-02 (×8): 600 mg via ORAL
  Filled 2016-01-31 (×8): qty 2

## 2016-01-31 MED ORDER — ONDANSETRON HCL 4 MG/2ML IJ SOLN: 4.0000 mg | Freq: Four times a day (QID) | INTRAMUSCULAR | Status: DC | PRN

## 2016-01-31 MED ORDER — ASPIRIN 81 MG PO CHEW
324.0000 mg | CHEWABLE_TABLET | Freq: Once | ORAL | Status: AC
  Administered 2016-01-31: 324 mg via ORAL
  Filled 2016-01-31: qty 4

## 2016-01-31 MED ORDER — CLOPIDOGREL BISULFATE 75 MG PO TABS
75.0000 mg | ORAL_TABLET | ORAL | Status: DC
  Administered 2016-01-31 – 2016-02-02 (×4): 75 mg via ORAL
  Filled 2016-01-31 (×4): qty 1

## 2016-01-31 MED ORDER — NITROGLYCERIN 0.4 MG SL SUBL
0.4000 mg | SUBLINGUAL_TABLET | SUBLINGUAL | Status: DC | PRN
  Administered 2016-02-01 – 2016-02-02 (×2): 0.4 mg via SUBLINGUAL
  Filled 2016-01-31 (×3): qty 1

## 2016-01-31 NOTE — Consult Note (Signed)
[facility] Consultation Note  Patient ID: [name], MRN: [id number], DOB/AGE: 71-Mar-1947 71 y.o. Admit date: 01/30/2016   Date of Consult: 01/31/2016 Primary Physician: [name], MD Primary Cardiologist: Nolon Nations  Chief Complaint:  Chief Complaint  Patient presents with  . Respiratory Distress   Reason for Consult: nstemi   HPI: 70 y.o. male with known cad sp cabg with acute chest pain and burning for 60min and then sudden onset sob with  Pulmonary edema with acute on chronic systolic dysfunction chf  He is now pain free and improved breathing and less chf sx  ecg shows lbbb and troponin suggests nstemi  Past Medical History  Diagnosis Date  . Anemia   . Cancer ([place]) 12/2013    prostate  . Shortness of breath dyspnea   . COPD (chronic obstructive pulmonary disease) ([place])   . Cardiogenic pulmonary edema ([place]) 12/19/2014  . Diabetes mellitus without complication ([place])   . Hypertension   . Hypercholesteremia   . CHF (congestive heart failure) ([place])   . Cardiomyopathy, ischemic   . GERD (gastroesophageal reflux disease)   . [id number]      Surgical History:  Past Surgical History  Procedure Laterality Date  . Coronary angioplasty with stent placement    . 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 Tonsillectomy    . Implantable cardioverter defibrillator (icd) generator change Left 12/12/2015    Procedure: DUAL LEAD PLACEMENT CARDIAC DIFIBRILLATOR;  Surgeon: [name], MD;  Location: [facility] ORS;  Service: Cardiovascular;  Laterality: Left;     Home Meds: Prior to Admission medications   Medication Sig Start Date End Date Taking? Authorizing Provider  albuterol (PROVENTIL HFA;VENTOLIN HFA) 108 (90 Base) MCG/ACT inhaler Inhale 2 puffs into the lungs 3 (three) times daily as needed for wheezing or shortness of breath.   Yes Historical Provider, MD  aspirin 81 MG tablet Take 81 mg by mouth every morning.   Yes  Historical Provider, MD  atorvastatin (LIPITOR) 40 MG tablet Take 40 mg by mouth every evening.   Yes Historical Provider, MD  budesonide-formoterol (SYMBICORT) 80-4.5 MCG/ACT inhaler Inhale 2 puffs into the lungs 2 (two) times daily.   Yes Historical Provider, MD  clopidogrel (PLAVIX) 75 MG tablet Take 75 mg by mouth every morning.   Yes Historical Provider, MD  dexlansoprazole (DEXILANT) 60 MG capsule Take 60 mg by mouth daily.   Yes Historical Provider, MD  ferrous sulfate 325 (65 FE) MG tablet Take 325 mg by mouth 2 (two) times daily with a meal.   Yes Historical Provider, MD  finasteride (PROSCAR) 5 MG tablet Take 5 mg by mouth at bedtime.   Yes Historical Provider, MD  FLUoxetine (PROZAC) 20 MG tablet Take 20 mg by mouth every morning.   Yes Historical Provider, MD  furosemide (LASIX) 40 MG tablet Take 40 mg by mouth every morning.   Yes Historical Provider, MD  gabapentin (NEURONTIN) 300 MG capsule Take 600 mg by mouth 3 (three) times daily.   Yes Historical Provider, MD  insulin glargine (LANTUS) 100 UNIT/ML injection Inject 90 Units into the skin at bedtime.   Yes Historical Provider, MD  insulin lispro (HUMALOG) 100 UNIT/ML injection Inject into the skin 3 (three) times daily before meals. Sliding scale based on blood sugar results, 8-14 units.   Yes Historical Provider, MD  isosorbide mononitrate (IMDUR) 60 MG 24 hr tablet Take 60 mg by mouth 2 (two) times daily.   Yes Historical Provider,  MD  lisinopril (PRINIVIL,ZESTRIL) 30 MG tablet Take 30 mg by mouth every morning.   Yes Historical Provider, MD  loratadine (CLARITIN) 10 MG tablet Take 10 mg by mouth daily.   Yes Historical Provider, MD  metFORMIN (GLUCOPHAGE) 1000 MG tablet Take 1,000 mg by mouth 2 (two) times daily with a meal.    Yes Historical Provider, MD  metoprolol (LOPRESSOR) 50 MG tablet Take 50 mg by mouth 2 (two) times daily.   Yes Historical Provider, MD  Multiple Vitamins-Minerals (CENTRUM SILVER PO) Take 1 tablet by  mouth every morning.   Yes Historical Provider, MD  ranolazine (RANEXA) 500 MG 12 hr tablet Take 500 mg by mouth 2 (two) times daily.   Yes Historical Provider, MD  spironolactone (ALDACTONE) 25 MG tablet Take 25 mg by mouth every morning.   Yes Historical Provider, MD  tamsulosin (FLOMAX) 0.4 MG CAPS capsule Take 0.4 mg by mouth daily after supper.   Yes Historical Provider, MD  cephALEXin (KEFLEX) 250 MG capsule Take 1 capsule (250 mg total) by mouth 4 (four) times daily. Patient not taking: Reported on 01/31/2016 12/13/15   Isaias Cowman, MD  nitroGLYCERIN (NITROSTAT) 0.4 MG SL tablet Place 0.4 mg under the tongue every 5 (five) minutes as needed for chest pain.    Historical Provider, MD    Inpatient Medications:  . aspirin  81 mg Oral BH-q7a  . atorvastatin  40 mg Oral QPM  . clopidogrel  75 mg Oral BH-q7a  . [START ON 02/01/2016] finasteride  5 mg Oral QHS  . FLUoxetine  20 mg Oral BH-q7a  . furosemide  60 mg Intravenous BID  . gabapentin  600 mg Oral TID  . insulin aspart  0-15 Units Subcutaneous TID WC  . insulin aspart  0-5 Units Subcutaneous QHS  . insulin aspart  8 Units Subcutaneous TID WC  . insulin glargine  75 Units Subcutaneous QHS  . isosorbide mononitrate  60 mg Oral BID  . lisinopril  30 mg Oral BH-q7a  . loratadine  10 mg Oral Daily  . metFORMIN  1,000 mg Oral BID WC  . metoprolol  50 mg Oral BID  . mometasone-formoterol  2 puff Inhalation BID  . pantoprazole  40 mg Oral Daily  . ranolazine  500 mg Oral BID  . sodium chloride flush  3 mL Intravenous Q12H  . sodium chloride flush  3 mL Intravenous Q12H  . spironolactone  25 mg Oral BH-q7a  . tamsulosin  0.4 mg Oral QPC supper   . heparin 1,250 Units/hr (01/31/16 0600)    Allergies:  Allergies  Allergen Reactions  . Benadryl [Diphenhydramine] Other (See Comments)    " Hyperactivity"  . Doxycycline Swelling    Pt went into pulmonary edema.  . Lopid [Gemfibrozil] Swelling    "I gain 1 pound a day for 30  days."    Social History   Social History  . Marital Status: Married    Spouse Name: N/A  . Number of Children: N/A  . Years of Education: N/A   Occupational History  . Not on file.   Social History Main Topics  . Smoking status: Former Smoker    Quit date: 12/03/1996  . Smokeless tobacco: Not on file  . Alcohol Use: No  . Drug Use: No  . Sexual Activity: Not on file   Other Topics Concern  . Not on file   Social History Narrative     Family History  Problem Relation Age of Onset  .  CAD Mother   . Alzheimer's disease Father      Review of Systems Positive for chest pain sob Negative for: General:  chills, fever, night sweats or weight changes.  Cardiovascular: PND orthopnea syncope dizziness  Dermatological skin lesions rashes Respiratory: Cough congestion Urologic: Frequent urination urination at night and hematuria Abdominal: negative for nausea, vomiting, diarrhea, bright red blood per rectum, melena, or hematemesis Neurologic: negative for visual changes, and/or hearing changes  All other systems reviewed and are otherwise negative except as noted above.  Labs:  Recent Labs  01/31/16 0005 01/31/16 0518 01/31/16 1048  TROPONINI 0.18* 1.87* 2.48*   Lab Results  Component Value Date   WBC 14.4* 01/31/2016   HGB 11.6* 01/31/2016   HCT 35.3* 01/31/2016   MCV 90.6 01/31/2016   PLT 245 01/31/2016    Recent Labs Lab 01/31/16 0005 01/31/16 0518  NA 140 138  K 3.2* 3.7  CL 105 107  CO2 23 23  BUN 25* 23*  CREATININE 1.35* 1.18  CALCIUM 9.2 8.8*  PROT 8.3*  --   BILITOT 0.6  --   ALKPHOS 70  --   ALT 25  --   AST 31  --   GLUCOSE 156* 71   Lab Results  Component Value Date   CHOL 140 01/05/2014   HDL 33* 01/05/2014   LDLCALC 86 01/05/2014   TRIG 103 01/05/2014   No results found for: DDIMER  Radiology/Studies:  Dg Chest Portable 1 View  01/31/2016  CLINICAL DATA:  Respiratory distress tonight. Severe shortness of breath. History of  CHF pulmonary edema. EXAM: PORTABLE CHEST 1 VIEW COMPARISON:  12/13/2015 FINDINGS: Left-sided pacemaker remains in place. Patient is post median sternotomy. Heart is mildly enlarged. Perihilar vascular haziness concerning for pulmonary edema. No large pleural effusion, left lower costophrenic angle excluded from the field of view. Suspect trace fluid in the right minor fissure. Bibasilar subsegmental atelectasis without confluent airspace disease. No pneumothorax. IMPRESSION: Cardiomegaly with perihilar pulmonary edema. Electronically Signed   By: Jeb Levering M.D.   On: 01/31/2016 01:07    EKG: nsr lbbb  Weights: Filed Weights   01/31/16 0000 01/31/16 0411  Weight: 252 lb (114.306 kg) 251 lb 15.8 oz (114.3 kg)     Physical Exam: Blood pressure 135/69, pulse 48, temperature 97.9 F (36.6 C), temperature source Axillary, resp. rate 22, height 5\' 8"  (1.727 m), weight 251 lb 15.8 oz (114.3 kg), SpO2 100 %. Body mass index is 38.32 kg/(m^2). General: Well developed, well nourished, in no acute distress. Head eyes ears nose throat: Normocephalic, atraumatic, sclera non-icteric, no xanthomas, nares are without discharge. No apparent thyromegaly and/or mass  Lungs: Normal respiratory effort.  no wheezes, fewrales, no rhonchi.  Heart: RRR with normal S1 S2. no murmur gallop, no rub, PMI is normal size and placement, carotid upstroke normal without bruit, jugular venous pressure is normal Abdomen: Soft, non-tender, non-distended with normoactive bowel sounds. No hepatomegaly. No rebound/guarding. No obvious abdominal masses. Abdominal aorta is normal size without bruit Extremities:trace edema. no cyanosis, no clubbing, no ulcers  Peripheral : 2+ bilateral upper extremity pulses, 2+ bilateral femoral pulses, 2+ bilateral dorsal pedal pulse Neuro: Alert and oriented. No facial asymmetry. No focal deficit. Moves all extremities spontaneously. Musculoskeletal: Normal muscle tone without  kyphosis Psych:  Responds to questions appropriately with a normal affect.    Assessment: 70 yo with acute on chronic systolic chf with nstemi   Plan: 1. Medical mgt with heparin asa plavix for nstemi 2. lasix  for chf 3. Consider cath for  monday  SignedCorey Skains M.D. [facility] 01/31/2016, 1:00 PM

## 2016-01-31 NOTE — Progress Notes (Signed)
[facility] [place] NAME: [name]    MR#:  [id number]  DATE OF BIRTH:  [birth year]  SUBJECTIVE:  CHIEF COMPLAINT:  Patient is resting comfortably. Denies any chest pain. But reports experiencing intermittent episodes of left-sided chest pain. The wife and daughter were at bedside.  REVIEW OF SYSTEMS:  CONSTITUTIONAL: No fever, fatigue or weakness.  EYES: No blurred or double vision.  EARS, NOSE, AND THROAT: No tinnitus or ear pain.  RESPIRATORY: No cough, shortness of breath, wheezing or hemoptysis.  CARDIOVASCULAR: No chest pain, orthopnea, edema.  GASTROINTESTINAL: No nausea, vomiting, diarrhea or abdominal pain.  GENITOURINARY: No dysuria, hematuria.  ENDOCRINE: No polyuria, nocturia,  HEMATOLOGY: No anemia, easy bruising or bleeding SKIN: No rash or lesion. MUSCULOSKELETAL: No joint pain or arthritis.   NEUROLOGIC: No tingling, numbness, weakness.  PSYCHIATRY: No anxiety or depression.   DRUG ALLERGIES:   Allergies  Allergen Reactions  . Benadryl [Diphenhydramine] Other (See Comments)    " Hyperactivity"  . Doxycycline Swelling    Pt went into pulmonary edema.  . Lopid [Gemfibrozil] Swelling    "I gain 1 pound a day for 30 days."    VITALS:  Blood pressure 135/69, pulse 48, temperature 97.9 F (36.6 C), temperature source Axillary, resp. rate 22, height 5\' 8"  (1.727 m), weight 114.3 kg (251 lb 15.8 oz), SpO2 100 %.  PHYSICAL EXAMINATION:  GENERAL:  70 y.o.-year-old patient lying in the bed with no acute distress.  EYES: Pupils equal, round, reactive to light and accommodation. No scleral icterus. Extraocular muscles intact.  HEENT: Head atraumatic, normocephalic. Oropharynx and nasopharynx clear.  NECK:  Supple, no jugular venous distention. No thyroid enlargement, no tenderness.  LUNGS: Normal breath sounds bilaterally, no wheezing, rales,rhonchi or crepitation. No use of accessory muscles of respiration.   CARDIOVASCULAR: S1, S2 normal. No murmurs, rubs, or gallops. No reproducible anterior chest wall tenderness on palpation ABDOMEN: Soft, nontender, nondistended. Bowel sounds present. No organomegaly or mass.  EXTREMITIES: No pedal edema, cyanosis, or clubbing.  NEUROLOGIC: Cranial nerves II through XII are intact. Muscle strength 5/5 in all extremities. Sensation intact. Gait not checked.  PSYCHIATRIC: The patient is alert and oriented x 3.  SKIN: No obvious rash, lesion, or ulcer.    LABORATORY PANEL:   CBC  Recent Labs Lab 01/31/16 0005  WBC 14.4*  HGB 11.6*  HCT 35.3*  PLT 245   ------------------------------------------------------------------------------------------------------------------  Chemistries   Recent Labs Lab 01/31/16 0005 01/31/16 0518  NA 140 138  K 3.2* 3.7  CL 105 107  CO2 23 23  GLUCOSE 156* 71  BUN 25* 23*  CREATININE 1.35* 1.18  CALCIUM 9.2 8.8*  MG 1.3*  --   AST 31  --   ALT 25  --   ALKPHOS 70  --   BILITOT 0.6  --    ------------------------------------------------------------------------------------------------------------------  Cardiac Enzymes  Recent Labs Lab 01/31/16 1048  TROPONINI 2.48*   ------------------------------------------------------------------------------------------------------------------  RADIOLOGY:  Dg Chest Portable 1 View  01/31/2016  CLINICAL DATA:  Respiratory distress tonight. Severe shortness of breath. History of CHF pulmonary edema. EXAM: PORTABLE CHEST 1 VIEW COMPARISON:  12/13/2015 FINDINGS: Left-sided pacemaker remains in place. Patient is post median sternotomy. Heart is mildly enlarged. Perihilar vascular haziness concerning for pulmonary edema. No large pleural effusion, left lower costophrenic angle excluded from the field of view. Suspect trace fluid in the right minor fissure. Bibasilar subsegmental atelectasis without confluent airspace disease. No pneumothorax. IMPRESSION: Cardiomegaly with  perihilar pulmonary edema. Electronically Signed   By: Jeb Levering M.D.   On: 01/31/2016 01:07    EKG:   Orders placed or performed during the hospital encounter of 01/30/16  . EKG 12-Lead  . EKG 12-Lead    ASSESSMENT AND PLAN:    * N STEMI due to acute on chronic systolic CHF  Scheduled for cardiac catheterization on Monday , nothing by mouth after midnight  Continue heparin drip and medical management in the interim  Continue  aspirin, statin, heparin drip and beta blocker. Appreciate K[place] recommendations.  Acute on chronic systolic CHF with acute hypoxic respiratory failure - CONTINUE IV Lasix, Beta blockers - Input and Output, daily weights - Counseled to limit fluids and Salt intake - Monitor Bun/Cr and Potassium -Cardiology f/u  AICD placed in January 2017 with ejection fraction of 30% - BiPAP support. Intubation if any worsening  * Hypertension Continue home medications.  * Diabetes mellitus, insulin-dependent Continue home dose of Lantus and pre-meal NovoLog. Reduce the doses. Add a sliding scale insulin.  * DVT prophylaxis -heparin drip        All the records are reviewed and case discussed with Care Management/Social Workerr. Management plans discussed with the patient, family and they are in agreement.  CODE STATUS: Full code  TOTAL CRITICAL CARE TIME TAKING CARE OF THIS PATIENT: 35 minutes.   POSSIBLE D/C IN 2-3 DAYS, DEPENDING ON CLINICAL CONDITION.  greater than 50% Time was spent on coordination of care and counseling  Nicholes Mango M.D on 01/31/2016 at 3:10 PM  Between 7am to 6pm - Pager - [phone] After 6pm go to [email or web address] - password [email or web address] [facility]  Office  [phone]  CC: Primary care physician; [name], MD

## 2016-01-31 NOTE — ED Notes (Signed)
Called floor to let them know pt on the way up 

## 2016-01-31 NOTE — Progress Notes (Signed)
Notified Dr. Lavetta Nielsen of Trop 1.87; no new orders received.

## 2016-01-31 NOTE — ED Notes (Signed)
Called pharmacy as to status of heparin - was informed medication would be sent.

## 2016-01-31 NOTE — H&P (Signed)
[facility] NAME: [name]    MR#:  [id number]  DATE OF BIRTH:  1946/05/02  DATE OF ADMISSION:  01/30/2016  PRIMARY CARE PHYSICIAN: [name], MD   REQUESTING/REFERRING PHYSICIAN: Dr. Karma Greaser  CHIEF COMPLAINT:   Chief Complaint  Patient presents with  . Respiratory Distress    HISTORY OF PRESENT ILLNESS:  [name]  is a 70 y.o. male with a known history of chronic systolic CHF with ejection fraction of 35%, hypertension, diabetes presents to the emergency room complaining of acute onset of shortness of breath at 11:30 PM. Patient was watching TV then placed his CPAP which he sleeps with and after about 20 minutes he noticed that he was acutely getting short of breath and develops some left-sided chest pain. Patient was brought to the emergency room and had to be placed on BiPAP due to acute shortness of breath and was given IV Lasix after chest x-ray showed pulmonary edema. His chest pain improved once he was placed on BiPAP. Patient was fine until late in the night. He did go out but did not eat any food with significant salt. Watches the amount of fluid. No recent change in his weight at home. No change in Lasix.  Also his troponin has been found to be elevated at 0.18.  Echocardiogram in November 2016 showed ejection fraction of 30%. AICD placed in January 2017.  PAST MEDICAL HISTORY:   Past Medical History  Diagnosis Date  . Anemia   . Cancer ([place]) 12/2013    prostate  . Shortness of breath dyspnea   . COPD (chronic obstructive pulmonary disease) ([place])   . Cardiogenic pulmonary edema ([place]) 12/19/2014  . Diabetes mellitus without complication ([place])   . Hypertension   . Hypercholesteremia   . CHF (congestive heart failure) ([place])   . Cardiomyopathy, ischemic   . GERD (gastroesophageal reflux disease)   . Myocardial infarction [facility]) [phone]    PAST SURGICAL HISTORY:   Past Surgical  History  Procedure Laterality Date  . Coronary angioplasty with stent placement    . 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 Tonsillectomy    . Implantable cardioverter defibrillator (icd) generator change Left 12/12/2015    Procedure: DUAL LEAD PLACEMENT CARDIAC DIFIBRILLATOR;  Surgeon: [name], MD;  Location: [facility] ORS;  Service: Cardiovascular;  Laterality: Left;    SOCIAL HISTORY:   Social History  Substance Use Topics  . Smoking status: Former Smoker    Quit date: 12/03/1996  . Smokeless tobacco: Not on file  . Alcohol Use: No    FAMILY HISTORY:   Family History  Problem Relation Age of Onset  . CAD Mother   . Alzheimer's disease Father     DRUG ALLERGIES:   Allergies  Allergen Reactions  . Benadryl [Diphenhydramine] Other (See Comments)    " Hyperactivity"  . Doxycycline Swelling    Pt went into pulmonary edema.  . Lopid [Gemfibrozil] Swelling    "I gain 1 pound a day for 30 days."    REVIEW OF SYSTEMS:   Review of Systems  Constitutional: Positive for malaise/fatigue. Negative for fever and chills.  HENT: Negative for sore throat.   Eyes: Negative for blurred vision, double vision and pain.  Respiratory: Positive for cough and shortness of breath. Negative for hemoptysis and wheezing.   Cardiovascular: Positive for chest pain, orthopnea and leg swelling. Negative for palpitations.  Gastrointestinal: Negative for heartburn, nausea, vomiting, abdominal pain,  diarrhea and constipation.  Genitourinary: Negative for dysuria and hematuria.  Musculoskeletal: Negative for back pain and joint pain.  Skin: Negative for rash.  Neurological: Positive for weakness. Negative for sensory change, speech change, focal weakness and headaches.  Endo/Heme/Allergies: Does not bruise/bleed easily.  Psychiatric/Behavioral: Negative for depression. The patient is not nervous/anxious.     MEDICATIONS AT HOME:   Prior to Admission medications   Medication Sig Start  Date End Date Taking? Authorizing Provider  albuterol (PROVENTIL HFA;VENTOLIN HFA) 108 (90 Base) MCG/ACT inhaler Inhale 2 puffs into the lungs 3 (three) times daily as needed for wheezing or shortness of breath.   Yes Historical Provider, MD  aspirin 81 MG tablet Take 81 mg by mouth every morning.   Yes Historical Provider, MD  atorvastatin (LIPITOR) 40 MG tablet Take 40 mg by mouth every evening.   Yes Historical Provider, MD  budesonide-formoterol (SYMBICORT) 80-4.5 MCG/ACT inhaler Inhale 2 puffs into the lungs 2 (two) times daily.   Yes Historical Provider, MD  clopidogrel (PLAVIX) 75 MG tablet Take 75 mg by mouth every morning.   Yes Historical Provider, MD  dexlansoprazole (DEXILANT) 60 MG capsule Take 60 mg by mouth daily.   Yes Historical Provider, MD  ferrous sulfate 325 (65 FE) MG tablet Take 325 mg by mouth 2 (two) times daily with a meal.   Yes Historical Provider, MD  finasteride (PROSCAR) 5 MG tablet Take 5 mg by mouth at bedtime.   Yes Historical Provider, MD  FLUoxetine (PROZAC) 20 MG tablet Take 20 mg by mouth every morning.   Yes Historical Provider, MD  furosemide (LASIX) 40 MG tablet Take 40 mg by mouth every morning.   Yes Historical Provider, MD  gabapentin (NEURONTIN) 300 MG capsule Take 600 mg by mouth 3 (three) times daily.   Yes Historical Provider, MD  insulin glargine (LANTUS) 100 UNIT/ML injection Inject 90 Units into the skin at bedtime.   Yes Historical Provider, MD  insulin lispro (HUMALOG) 100 UNIT/ML injection Inject into the skin 3 (three) times daily before meals. Sliding scale based on blood sugar results, 8-14 units.   Yes Historical Provider, MD  isosorbide mononitrate (IMDUR) 60 MG 24 hr tablet Take 60 mg by mouth 2 (two) times daily.   Yes Historical Provider, MD  lisinopril (PRINIVIL,ZESTRIL) 30 MG tablet Take 30 mg by mouth every morning.   Yes Historical Provider, MD  loratadine (CLARITIN) 10 MG tablet Take 10 mg by mouth daily.   Yes Historical Provider, MD   metFORMIN (GLUCOPHAGE) 1000 MG tablet Take 1,000 mg by mouth 2 (two) times daily with a meal.    Yes Historical Provider, MD  metoprolol (LOPRESSOR) 50 MG tablet Take 50 mg by mouth 2 (two) times daily.   Yes Historical Provider, MD  Multiple Vitamins-Minerals (CENTRUM SILVER PO) Take 1 tablet by mouth every morning.   Yes Historical Provider, MD  ranolazine (RANEXA) 500 MG 12 hr tablet Take 500 mg by mouth 2 (two) times daily.   Yes Historical Provider, MD  spironolactone (ALDACTONE) 25 MG tablet Take 25 mg by mouth every morning.   Yes Historical Provider, MD  tamsulosin (FLOMAX) 0.4 MG CAPS capsule Take 0.4 mg by mouth daily after supper.   Yes Historical Provider, MD  cephALEXin (KEFLEX) 250 MG capsule Take 1 capsule (250 mg total) by mouth 4 (four) times daily. Patient not taking: Reported on 01/31/2016 12/13/15   Isaias Cowman, MD  nitroGLYCERIN (NITROSTAT) 0.4 MG SL tablet Place 0.4 mg under the tongue  every 5 (five) minutes as needed for chest pain.    Historical Provider, MD     VITAL SIGNS:  Blood pressure 148/70, pulse 90, temperature 97.5 F (36.4 C), temperature source Axillary, resp. rate 27, height 5\' 8"  (1.727 m), weight 114.306 kg (252 lb), SpO2 99 %.  PHYSICAL EXAMINATION:  Physical Exam  GENERAL:  70 y.o.-year-old patient lying in the bed with resp distress.  EYES: Pupils equal, round, reactive to light and accommodation. No scleral icterus. Extraocular muscles intact.  HEENT: Head atraumatic, normocephalic. Oropharynx and nasopharynx clear. No oropharyngeal erythema, moist oral mucosa  NECK:  Supple, no jugular venous distention. No thyroid enlargement, no tenderness.  LUNGS: no wheezing, rhonchi. Bilateral basal crackles CARDIOVASCULAR: S1, S2 normal. No murmurs, rubs, or gallops.  ABDOMEN: Soft, nontender, nondistended. Bowel sounds present. No organomegaly or mass.  EXTREMITIES: No pedal edema, cyanosis, or clubbing. + 2 pedal & radial pulses b/l.   NEUROLOGIC:  Cranial nerves II through XII are intact. No focal Motor or sensory deficits appreciated b/l PSYCHIATRIC: The patient is alert and oriented x 3. Good affect.  SKIN: No obvious rash, lesion, or ulcer.   LABORATORY PANEL:   CBC No results for input(s): WBC, HGB, HCT, PLT in the last 168 hours. ------------------------------------------------------------------------------------------------------------------  Chemistries   Recent Labs Lab 01/31/16 0005  NA 140  K 3.2*  CL 105  CO2 23  GLUCOSE 156*  BUN 25*  CREATININE 1.35*  CALCIUM 9.2  MG 1.3*  AST 31  ALT 25  ALKPHOS 70  BILITOT 0.6   ------------------------------------------------------------------------------------------------------------------  Cardiac Enzymes  Recent Labs Lab 01/31/16 0005  TROPONINI 0.18*   ------------------------------------------------------------------------------------------------------------------  RADIOLOGY:  Dg Chest Portable 1 View  01/31/2016  CLINICAL DATA:  Respiratory distress tonight. Severe shortness of breath. History of CHF pulmonary edema. EXAM: PORTABLE CHEST 1 VIEW COMPARISON:  12/13/2015 FINDINGS: Left-sided pacemaker remains in place. Patient is post median sternotomy. Heart is mildly enlarged. Perihilar vascular haziness concerning for pulmonary edema. No large pleural effusion, left lower costophrenic angle excluded from the field of view. Suspect trace fluid in the right minor fissure. Bibasilar subsegmental atelectasis without confluent airspace disease. No pneumothorax. IMPRESSION: Cardiomegaly with perihilar pulmonary edema. Electronically Signed   By: Jeb Levering M.D.   On: 01/31/2016 01:07     IMPRESSION AND PLAN:   * Acute on chronic systolic CHF with acute hypoxic respiratory failure - IV Lasix, Beta blockers - Input and Output - Counseled to limit fluids and Salt - Monitor Bun/Cr and Potassium -Cardiology consult AICD placed in January 2017 with ejection  fraction of 30% - BiPAP support. Intubation if any worsening  * Elevated troponin - likely N STEMI due to acute pulmonary edema. Will treat with aspirin, statin, heparin drip and beta blocker. Consult cardiology. If there is no significant increase in troponin this would likely be due to demand ischemia.  * Hypertension Continue home medications.  * Diabetes mellitus, insulin-dependent Continue home dose of Lantus and pre-meal NovoLog. Reduce the doses. Add a sliding scale insulin.  * DVT prophylaxis with Lovenox  All the records are reviewed and case discussed with ED provider. Management plans discussed with the patient, family and they are in agreement.  CODE STATUS: FULL  TOTAL TIME TAKING CARE OF THIS PATIENT: 40 minutes.   Hillary Bow R M.D on 01/31/2016 at 1:47 AM  Between 7am to 6pm - Pager - [phone]  After 6pm go to [email or web address] - password [facility]  Office  [phone]  CC: Primary care physician; [name], MD  Note: This dictation was prepared with Dragon dictation along with smaller phrase technology. Any transcriptional errors that result from this process are unintentional.

## 2016-01-31 NOTE — Progress Notes (Signed)
   01/31/16 1403  Clinical Encounter Type  Visited With Patient and family together  Visit Type Code  Consult/Referral To Chaplain  Chaplain attended code blue and offered a compassionate presence for patient and wife.   [place] [phone]

## 2016-01-31 NOTE — Progress Notes (Signed)
ANTICOAGULATION CONSULT NOTE - Initial Consult  Pharmacy Consult for heparin drip Indication: NSTEIM  Allergies  Allergen Reactions  . Benadryl [Diphenhydramine] Other (See Comments)    " Hyperactivity"  . Doxycycline Swelling    Pt went into pulmonary edema.  . Lopid [Gemfibrozil] Swelling    "I gain 1 pound a day for 30 days."    Patient Measurements: Height: 5\' 8"  (172.7 cm) Weight: 251 lb 15.8 oz (114.3 kg) IBW/kg (Calculated) : 68.4 Heparin Dosing Weight: 94kg  Vital Signs: Temp: 97.9 F (36.6 C) (02/25 0400) Temp Source: Axillary (02/25 0400) BP: 135/69 mmHg (02/25 0600) Pulse Rate: 48 (02/25 0600)  Labs:  Recent Labs  01/31/16 0005 01/31/16 0518 01/31/16 0852  HGB 11.6*  --   --   HCT 35.3*  --   --   PLT 245  --   --   APTT 26  --   --   LABPROT 15.1*  --   --   INR 1.17  --   --   HEPARINUNFRC  --   --  0.48  CREATININE 1.35* 1.18  --   TROPONINI 0.18* 1.87*  --     Estimated Creatinine Clearance: 71.5 mL/min (by C-G formula based on Cr of 1.18).   Medical History: Past Medical History  Diagnosis Date  . Anemia   . Cancer ([place]) 12/2013    prostate  . Shortness of breath dyspnea   . COPD (chronic obstructive pulmonary disease) ([place])   . Cardiogenic pulmonary edema ([place]) 12/19/2014  . Diabetes mellitus without complication ([place])   . Hypertension   . Hypercholesteremia   . CHF (congestive heart failure) ([place])   . Cardiomyopathy, ischemic   . GERD (gastroesophageal reflux disease)   . Myocardial infarction [facility]) [phone]    Medications:    Assessment: Hgb 11.6  plt 245   INR 1.17  aPTT 26 Does not appear to have been on anticoagulation as outpatient.  Goal of Therapy:  Heparin level 0.3-0.7 units/ml Monitor platelets by anticoagulation protocol: Yes   Plan:  Heparin level is at goal. Will continue heparin infusion at 1250 units/hr and check a confirmatory HL in 6 hours.   Ulice Dash D 01/31/2016,9:41 AM

## 2016-01-31 NOTE — Progress Notes (Addendum)
Patient transferred from CCU to 2A [id number]. Oriented to room, Ascom phones, call bell and staff. Bed in low position. Fall safety plan reviewed, non-skid socks in place, bed alarm on. Full assessment to Epic; skin assessed with Truitt Leep, RN. Telemetry box verified with tele clerk and Angelia Mould, NT: [phone]. Heparin gtt infusing at 12.20ml/hr. Pt denies RN offering to call and update family. Will continue to monitor.

## 2016-01-31 NOTE — Progress Notes (Signed)
ANTICOAGULATION CONSULT NOTE - Follow Up  Pharmacy Consult for heparin drip Indication: NSTEMI  Allergies  Allergen Reactions  . Benadryl [Diphenhydramine] Other (See Comments)    " Hyperactivity"  . Doxycycline Swelling    Pt went into pulmonary edema.  . Lopid [Gemfibrozil] Swelling    "I gain 1 pound a day for 30 days."    Patient Measurements: Height: 5\' 8"  (172.7 cm) Weight: 251 lb 15.8 oz (114.3 kg) IBW/kg (Calculated) : 68.4 Heparin Dosing Weight: 94kg  Vital Signs: Temp: 97.9 F (36.6 C) (02/25 0400) Temp Source: Axillary (02/25 0400) BP: 135/69 mmHg (02/25 0600) Pulse Rate: 48 (02/25 0600)  Labs:  Recent Labs  01/31/16 0005 01/31/16 0518 01/31/16 0852 01/31/16 1048 01/31/16 1458  HGB 11.6*  --   --   --   --   HCT 35.3*  --   --   --   --   PLT 245  --   --   --   --   APTT 26  --   --   --   --   LABPROT 15.1*  --   --   --   --   INR 1.17  --   --   --   --   HEPARINUNFRC  --   --  0.48  --  0.39  CREATININE 1.35* 1.18  --   --   --   TROPONINI 0.18* 1.87*  --  2.48*  --     Estimated Creatinine Clearance: 71.5 mL/min (by C-G formula based on Cr of 1.18).   Medical History: Past Medical History  Diagnosis Date  . Anemia   . Cancer ([place]) 12/2013    prostate  . Shortness of breath dyspnea   . COPD (chronic obstructive pulmonary disease) ([place])   . Cardiogenic pulmonary edema ([place]) 12/19/2014  . Diabetes mellitus without complication ([place])   . Hypertension   . Hypercholesteremia   . CHF (congestive heart failure) ([place])   . Cardiomyopathy, ischemic   . GERD (gastroesophageal reflux disease)   . Myocardial infarction [facility]) [phone]    Medications:    Assessment: Hgb 11.6  plt 245   INR 1.17  aPTT 26 Does not appear to have been on anticoagulation as outpatient.  Goal of Therapy:  Heparin level 0.3-0.7 units/ml Monitor platelets by anticoagulation protocol: Yes   Plan:  Heparin level is at goal (0.39). Will continue heparin  infusion at 1250 units/hr and move to daily heparin levels.  Vena Rua 01/31/2016,3:29 PM

## 2016-01-31 NOTE — Progress Notes (Addendum)
ANTICOAGULATION CONSULT NOTE - Initial Consult  Pharmacy Consult for heparin drip Indication: NSTEIM  Allergies  Allergen Reactions  . Benadryl [Diphenhydramine] Other (See Comments)    " Hyperactivity"  . Doxycycline Swelling    Pt went into pulmonary edema.  . Lopid [Gemfibrozil] Swelling    "I gain 1 pound a day for 30 days."    Patient Measurements: Height: 5\' 8"  (172.7 cm) Weight: 252 lb (114.306 kg) IBW/kg (Calculated) : 68.4 Heparin Dosing Weight: 94kg  Vital Signs: Temp: 97.5 F (36.4 C) (02/25 0005) Temp Source: Axillary (02/25 0005) BP: 148/70 mmHg (02/25 0118) Pulse Rate: 90 (02/25 0118)  Labs:  Recent Labs  01/31/16 0005  APTT 26  LABPROT 15.1*  INR 1.17  CREATININE 1.35*  TROPONINI 0.18*    Estimated Creatinine Clearance: 62.5 mL/min (by C-G formula based on Cr of 1.35).   Medical History: Past Medical History  Diagnosis Date  . Anemia   . Cancer ([place]) 12/2013    prostate  . Shortness of breath dyspnea   . COPD (chronic obstructive pulmonary disease) ([place])   . Cardiogenic pulmonary edema ([place]) 12/19/2014  . Diabetes mellitus without complication ([place])   . Hypertension   . Hypercholesteremia   . CHF (congestive heart failure) ([place])   . Cardiomyopathy, ischemic   . GERD (gastroesophageal reflux disease)   . Myocardial infarction [facility]) [phone]    Medications:    Assessment: Hgb 11.6  plt 245   INR 1.17  aPTT 26 Does not appear to have been on anticoagulation as outpatient.  Goal of Therapy:  Heparin level 0.3-0.7 units/ml Monitor platelets by anticoagulation protocol: Yes   Plan:  4000 unit bolus and initial rate of 1250 units/hr. First heparin level 6 hours after start of infusion.  Keir Viernes S 01/31/2016,1:27 AM

## 2016-01-31 NOTE — Progress Notes (Signed)
Initial Nutrition Assessment     INTERVENTION:  Meals and snacks: Cater to pt preferences, pt requesting chopped meats  Nutrition diet education: Pt familiar with foods high in sodium and able to name foods.  No further education needed at this time   NUTRITION DIAGNOSIS:    (none at this time) related to   as evidenced by  .    GOAL:   Patient will meet greater than or equal to 90% of their needs    MONITOR:    (Energy intake)  REASON FOR ASSESSMENT:   Diagnosis    ASSESSMENT:      Pt admitted with CHF, pulmonary edema, shortness of breath  Past Medical History  Diagnosis Date  . Anemia   . Cancer ([place]) 12/2013    prostate  . Shortness of breath dyspnea   . COPD (chronic obstructive pulmonary disease) ([place])   . Cardiogenic pulmonary edema ([place]) 12/19/2014  . Diabetes mellitus without complication ([place])   . Hypertension   . Hypercholesteremia   . CHF (congestive heart failure) ([place])   . Cardiomyopathy, ischemic   . GERD (gastroesophageal reflux disease)   . Myocardial infarction [facility]) [phone]    Current Nutrition: ate most of breakfast this am  Food/Nutrition-Related History: pt reports good/normal intake prior to admission   Scheduled Medications:  . aspirin  81 mg Oral BH-q7a  . atorvastatin  40 mg Oral QPM  . clopidogrel  75 mg Oral BH-q7a  . [START ON 02/01/2016] finasteride  5 mg Oral QHS  . FLUoxetine  20 mg Oral BH-q7a  . furosemide  60 mg Intravenous BID  . gabapentin  600 mg Oral TID  . insulin aspart  0-15 Units Subcutaneous TID WC  . insulin aspart  0-5 Units Subcutaneous QHS  . insulin aspart  8 Units Subcutaneous TID WC  . insulin glargine  75 Units Subcutaneous QHS  . isosorbide mononitrate  60 mg Oral BID  . lisinopril  30 mg Oral BH-q7a  . loratadine  10 mg Oral Daily  . metFORMIN  1,000 mg Oral BID WC  . metoprolol  50 mg Oral BID  . mometasone-formoterol  2 puff Inhalation BID  . pantoprazole  40 mg Oral Daily  .  ranolazine  500 mg Oral BID  . sodium chloride flush  3 mL Intravenous Q12H  . sodium chloride flush  3 mL Intravenous Q12H  . spironolactone  25 mg Oral BH-q7a  . tamsulosin  0.4 mg Oral QPC supper    Continuous Medications:  . heparin 1,250 Units/hr (01/31/16 0600)     Electrolyte/Renal Profile and Glucose Profile:   Recent Labs Lab 01/31/16 0005 01/31/16 0518  NA 140 138  K 3.2* 3.7  CL 105 107  CO2 23 23  BUN 25* 23*  CREATININE 1.35* 1.18  CALCIUM 9.2 8.8*  MG 1.3*  --   GLUCOSE 156* 71   Protein Profile:  Recent Labs Lab 01/31/16 0005  ALBUMIN 4.1    Gastrointestinal Profile: Last BM: prior to admission    Weight Change: stable wt per pt    Diet Order:  Diet heart healthy/carb modified Room service appropriate?: Yes; Fluid consistency:: Thin  Skin:   reviewed, no issues   Height:   Ht Readings from Last 1 Encounters:  01/31/16 5\' 8"  (1.727 m)    Weight:   Wt Readings from Last 1 Encounters:  01/31/16 251 lb 15.8 oz (114.3 kg)    Ideal Body Weight:     BMI:  Body mass index is 38.32 kg/(m^2).   EDUCATION NEEDS:   No education needs identified at this time  LOW Care Level  Dareon Nunziato B. Zenia Resides, [place], [place] (pager) Weekend/On-Call pager [phone])

## 2016-02-01 LAB — CBC
HEMATOCRIT: 28.2 % — AB (ref 40.0–52.0)
HEMOGLOBIN: 9.5 g/dL — AB (ref 13.0–18.0)
MCH: 29.6 pg (ref 26.0–34.0)
MCHC: 33.7 g/dL (ref 32.0–36.0)
MCV: 87.8 fL (ref 80.0–100.0)
Platelets: 147 10*3/uL — ABNORMAL LOW (ref 150–440)
RBC: 3.21 MIL/uL — AB (ref 4.40–5.90)
RDW: 16 % — ABNORMAL HIGH (ref 11.5–14.5)
WBC: 7.5 10*3/uL (ref 3.8–10.6)

## 2016-02-01 LAB — GLUCOSE, CAPILLARY
GLUCOSE-CAPILLARY: 91 mg/dL (ref 65–99)
GLUCOSE-CAPILLARY: 80 mg/dL (ref 65–99)
GLUCOSE-CAPILLARY: 89 mg/dL (ref 65–99)
Glucose-Capillary: 114 mg/dL — ABNORMAL HIGH (ref 65–99)

## 2016-02-01 LAB — HEPARIN LEVEL (UNFRACTIONATED)
HEPARIN UNFRACTIONATED: 0.32 [IU]/mL (ref 0.30–0.70)
Heparin Unfractionated: 0.36 IU/mL (ref 0.30–0.70)

## 2016-02-01 MED ORDER — LISINOPRIL 20 MG PO TABS
30.0000 mg | ORAL_TABLET | Freq: Every morning | ORAL | Status: DC
  Administered 2016-02-01: 30 mg via ORAL
  Filled 2016-02-01 (×2): qty 1

## 2016-02-01 MED ORDER — GUAIFENESIN-DM 100-10 MG/5ML PO SYRP
5.0000 mL | ORAL_SOLUTION | ORAL | Status: DC | PRN
  Administered 2016-02-02: 5 mL via ORAL
  Filled 2016-02-01: qty 5

## 2016-02-01 NOTE — Progress Notes (Signed)
ANTICOAGULATION CONSULT NOTE - Follow Up  Pharmacy Consult for heparin drip Indication: NSTEMI  Allergies  Allergen Reactions  . Benadryl [Diphenhydramine] Other (See Comments)    " Hyperactivity"  . Doxycycline Swelling    Pt went into pulmonary edema.  . Lopid [Gemfibrozil] Swelling    "I gain 1 pound a day for 30 days."    Patient Measurements: Height: 5\' 8"  (172.7 cm) Weight: 253 lb 4.8 oz (114.896 kg) IBW/kg (Calculated) : 68.4 Heparin Dosing Weight: 94kg  Vital Signs: Temp: 98.2 F (36.8 C) (02/26 1100) BP: 100/55 mmHg (02/26 1100) Pulse Rate: 59 (02/26 1100)  Labs:  Recent Labs  01/31/16 0005 01/31/16 0518  01/31/16 1048 01/31/16 1458 02/01/16 0544 02/01/16 1426  HGB 11.6*  --   --   --   --  9.5*  --   HCT 35.3*  --   --   --   --  28.2*  --   PLT 245  --   --   --   --  147*  --   APTT 26  --   --   --   --   --   --   LABPROT 15.1*  --   --   --   --   --   --   INR 1.17  --   --   --   --   --   --   HEPARINUNFRC  --   --   < >  --  0.39 0.32 0.36  CREATININE 1.35* 1.18  --   --   --   --   --   TROPONINI 0.18* 1.87*  --  2.48*  --   --   --   < > = values in this interval not displayed.  Estimated Creatinine Clearance: 71.7 mL/min (by C-G formula based on Cr of 1.18).   Medical History: Past Medical History  Diagnosis Date  . Anemia   . Cancer ([place]) 12/2013    prostate  . Shortness of breath dyspnea   . COPD (chronic obstructive pulmonary disease) ([place])   . Cardiogenic pulmonary edema ([place]) 12/19/2014  . Diabetes mellitus without complication ([place])   . Hypertension   . Hypercholesteremia   . CHF (congestive heart failure) ([place])   . Cardiomyopathy, ischemic   . GERD (gastroesophageal reflux disease)   . Myocardial infarction [facility]) [phone]    Assessment: Hgb 9.5  plt 147   INR 1.17  aPTT 26 Does not appear to have been on anticoagulation as outpatient.  Goal of Therapy:  Heparin level 0.3-0.7 units/ml Monitor platelets by  anticoagulation protocol: Yes   Plan:  Heparin level this morning still therapeutic at 0.32. Level appears to be trending down (0.48--> 0.39 --> 0.32) Will continue heparin infusion at 1250 units/hr, but will check a follow up level in 6-8 hours.   2/26 @1426 : Heparin level 0.36. Will continue to check daily heparin levels.   Nancy Fetter, PharmD Pharmacy Resident  02/01/2016,2:55 PM

## 2016-02-01 NOTE — Progress Notes (Signed)
ANTICOAGULATION CONSULT NOTE - Follow Up  Pharmacy Consult for heparin drip Indication: NSTEMI  Allergies  Allergen Reactions  . Benadryl [Diphenhydramine] Other (See Comments)    " Hyperactivity"  . Doxycycline Swelling    Pt went into pulmonary edema.  . Lopid [Gemfibrozil] Swelling    "I gain 1 pound a day for 30 days."    Patient Measurements: Height: 5\' 8"  (172.7 cm) Weight: 253 lb 4.8 oz (114.896 kg) IBW/kg (Calculated) : 68.4 Heparin Dosing Weight: 94kg  Vital Signs: Temp: 98 F (36.7 C) (02/25 2138) Temp Source: Oral (02/25 2138) BP: 117/75 mmHg (02/26 0505) Pulse Rate: 74 (02/26 0505)  Labs:  Recent Labs  01/31/16 0005 01/31/16 0518 01/31/16 0852 01/31/16 1048 01/31/16 1458 02/01/16 0544  HGB 11.6*  --   --   --   --  9.5*  HCT 35.3*  --   --   --   --  28.2*  PLT 245  --   --   --   --  147*  APTT 26  --   --   --   --   --   LABPROT 15.1*  --   --   --   --   --   INR 1.17  --   --   --   --   --   HEPARINUNFRC  --   --  0.48  --  0.39 0.32  CREATININE 1.35* 1.18  --   --   --   --   TROPONINI 0.18* 1.87*  --  2.48*  --   --     Estimated Creatinine Clearance: 71.7 mL/min (by C-G formula based on Cr of 1.18).   Medical History: Past Medical History  Diagnosis Date  . Anemia   . Cancer ([place]) 12/2013    prostate  . Shortness of breath dyspnea   . COPD (chronic obstructive pulmonary disease) ([place])   . Cardiogenic pulmonary edema ([place]) 12/19/2014  . Diabetes mellitus without complication ([place])   . Hypertension   . Hypercholesteremia   . CHF (congestive heart failure) ([place])   . Cardiomyopathy, ischemic   . GERD (gastroesophageal reflux disease)   . Myocardial infarction [facility]) [phone]    Assessment: Hgb 9.5  plt 147   INR 1.17  aPTT 26 Does not appear to have been on anticoagulation as outpatient.  Goal of Therapy:  Heparin level 0.3-0.7 units/ml Monitor platelets by anticoagulation protocol: Yes   Plan:  Heparin level this  morning still therapeutic at 0.32. Level appears to be trending down (0.48--> 0.39 --> 0.32) Will continue heparin infusion at 1250 units/hr, but will check a follow up level in 6-8 hours.   Nancy Fetter, PharmD Pharmacy Resident  02/01/2016,7:46 AM

## 2016-02-01 NOTE — Progress Notes (Signed)
BP=103/73 and HR=80 and patient has Imdur 60 mg and Metoprolol 50 mg to take. Dr. Jannifer Franklin notified with a new order to hold them. CBG=89 mg/dL, Lantus 75 units scheduled  at 2200. Dr. Jannifer Franklin notified with a new order to hold the Lantus as well. Will continue to monitor.

## 2016-02-01 NOTE — Progress Notes (Addendum)
Patient had 15 bt run of vtach which ceased spontaneously, small pause followed by a pacer spike. Dr. Margaretmary Eddy paged, orders for EKG received, will notify cardiologist.    10:56 AM: [name] aware, no further orders, pt to proceed with cath tomorrow. respiratory notified of stat EKG.

## 2016-02-01 NOTE — Progress Notes (Signed)
[place] [facility] [place] NAME: [name]    MR#:  [id number]  DATE OF BIRTH:  May 24, 1946  SUBJECTIVE:  CHIEF COMPLAINT:  Patient is resting comfortably. Denies any chest pain. Had an episode of NSVT 15 beats.  The wife  at bedside.  REVIEW OF SYSTEMS:  CONSTITUTIONAL: No fever, fatigue or weakness.  EYES: No blurred or double vision.  EARS, NOSE, AND THROAT: No tinnitus or ear pain.  RESPIRATORY: No cough, shortness of breath, wheezing or hemoptysis.  CARDIOVASCULAR: No chest pain, orthopnea, edema.  GASTROINTESTINAL: No nausea, vomiting, diarrhea or abdominal pain.  GENITOURINARY: No dysuria, hematuria.  ENDOCRINE: No polyuria, nocturia,  HEMATOLOGY: No anemia, easy bruising or bleeding SKIN: No rash or lesion. MUSCULOSKELETAL: No joint pain or arthritis.   NEUROLOGIC: No tingling, numbness, weakness.  PSYCHIATRY: No anxiety or depression.   DRUG ALLERGIES:   Allergies  Allergen Reactions  . Benadryl [Diphenhydramine] Other (See Comments)    " Hyperactivity"  . Doxycycline Swelling    Pt went into pulmonary edema.  . Lopid [Gemfibrozil] Swelling    "I gain 1 pound a day for 30 days."    VITALS:  Blood pressure 100/55, pulse 59, temperature 98.2 F (36.8 C), temperature source Oral, resp. rate 18, height 5\' 8"  (1.727 m), weight 114.896 kg (253 lb 4.8 oz), SpO2 99 %.  PHYSICAL EXAMINATION:  GENERAL:  70 y.o.-year-old patient lying in the bed with no acute distress.  EYES: Pupils equal, round, reactive to light and accommodation. No scleral icterus. Extraocular muscles intact.  HEENT: Head atraumatic, normocephalic. Oropharynx and nasopharynx clear.  NECK:  Supple, no jugular venous distention. No thyroid enlargement, no tenderness.  LUNGS: Normal breath sounds bilaterally, no wheezing, rales,rhonchi or crepitation. No use of accessory muscles of respiration.  CARDIOVASCULAR: S1, S2 normal. No murmurs, rubs, or gallops.  No reproducible anterior chest wall tenderness on palpation ABDOMEN: Soft, nontender, nondistended. Bowel sounds present. No organomegaly or mass.  EXTREMITIES: No pedal edema, cyanosis, or clubbing.  NEUROLOGIC: Cranial nerves II through XII are intact. Muscle strength 5/5 in all extremities. Sensation intact. Gait not checked.  PSYCHIATRIC: The patient is alert and oriented x 3.  SKIN: No obvious rash, lesion, or ulcer.    LABORATORY PANEL:   CBC  Recent Labs Lab 02/01/16 0544  WBC 7.5  HGB 9.5*  HCT 28.2*  PLT 147*   ------------------------------------------------------------------------------------------------------------------  Chemistries   Recent Labs Lab 01/31/16 0005 01/31/16 0518  NA 140 138  K 3.2* 3.7  CL 105 107  CO2 23 23  GLUCOSE 156* 71  BUN 25* 23*  CREATININE 1.35* 1.18  CALCIUM 9.2 8.8*  MG 1.3*  --   AST 31  --   ALT 25  --   ALKPHOS 70  --   BILITOT 0.6  --    ------------------------------------------------------------------------------------------------------------------  Cardiac Enzymes  Recent Labs Lab 01/31/16 1048  TROPONINI 2.48*   ------------------------------------------------------------------------------------------------------------------  RADIOLOGY:  Dg Chest Portable 1 View  01/31/2016  CLINICAL DATA:  Respiratory distress tonight. Severe shortness of breath. History of CHF pulmonary edema. EXAM: PORTABLE CHEST 1 VIEW COMPARISON:  12/13/2015 FINDINGS: Left-sided pacemaker remains in place. Patient is post median sternotomy. Heart is mildly enlarged. Perihilar vascular haziness concerning for pulmonary edema. No large pleural effusion, left lower costophrenic angle excluded from the field of view. Suspect trace fluid in the right minor fissure. Bibasilar subsegmental atelectasis without confluent airspace disease. No pneumothorax. IMPRESSION: Cardiomegaly with perihilar pulmonary edema. Electronically  Signed   By: Jeb Levering M.D.   On: 01/31/2016 01:07    EKG:   Orders placed or performed during the hospital encounter of 01/30/16  . EKG 12-Lead  . EKG 12-Lead  . EKG 12-Lead  . EKG 12-Lead    ASSESSMENT AND PLAN:    * N STEMI due to acute on chronic systolic CHF  Scheduled for cardiac catheterization on Monday , nothing by mouth after midnight  Continue heparin drip and medical management in the interim  Continue  aspirin, statin, heparin drip and beta blocker. Appreciate Kowalsky recommendations.  *NSVTach Cardio recommending cardiac cath in am, tele monitoring No need of adding any new meds at this time   Acute on chronic systolic CHF with acute hypoxic respiratory failure - CONTINUE IV Lasix, Beta blockers - Input and Output, daily weights - Counseled to limit fluids and Salt intake - Monitor Bun/Cr and Potassium -Cardiology f/u  AICD placed in January 2017 with ejection fraction of 30% - BiPAP support. Intubation if any worsening  * Hypertension Continue home medications.  * Diabetes mellitus, insulin-dependent Continue home dose of Lantus and pre-meal NovoLog. Reduce the doses. Add a sliding scale insulin.  * DVT prophylaxis -heparin drip        All the records are reviewed and case discussed with Care Management/Social Workerr. Management plans discussed with the patient, family and they are in agreement.  CODE STATUS: Full code  TOTAL CRITICAL CARE TIME TAKING CARE OF THIS PATIENT: 35 minutes.   POSSIBLE D/C IN 2-3 DAYS, DEPENDING ON CLINICAL CONDITION.  greater than 50% Time was spent on coordination of care and counseling  Nicholes Mango M.D on 02/01/2016 at 7:17 PM  Between 7am to 6pm - Pager - [phone] After 6pm go to [email or web address] - password [email or web address] [facility]  Office  [phone]  CC: Primary care physician; [name], MD

## 2016-02-01 NOTE — Progress Notes (Signed)
Patient c/o chest pain of 5/10 on a pain scale and NTG 0.4 mg sublingual administered x 1 dose. Patient verbalised relief and refused to take a second dose. Will continue to monitor.

## 2016-02-01 NOTE — Progress Notes (Signed)
Pt placed on CPAP with 2L O2 for sleep

## 2016-02-01 NOTE — Progress Notes (Signed)
[facility] Encounter Note  Patient: [name] / Admit Date: 01/30/2016 / Date of Encounter: 02/01/2016, 6:26 AM   Subjective: Patient is much better today and no evidence of chest pain or shortness of breath. Patient has had no symptoms with ambulation  Review of Systems: Positive for: None Negative for: Vision change, hearing change, syncope, dizziness, nausea, vomiting,diarrhea, bloody stool, stomach pain, cough, congestion, diaphoresis, urinary frequency, urinary pain,skin lesions, skin rashes Others previously listed  Objective: Telemetry: Normal sinus rhythm Physical Exam: Blood pressure 117/75, pulse 74, temperature 98 F (36.7 C), temperature source Oral, resp. rate 20, height 5\' 8"  (1.727 m), weight 251 lb 15.8 oz (114.3 kg), SpO2 96 %. Body mass index is 38.32 kg/(m^2). General: Well developed, well nourished, in no acute distress. Head: Normocephalic, atraumatic, sclera non-icteric, no xanthomas, nares are without discharge. Neck: No apparent masses Lungs: Normal respirations with no wheezes, no rhonchi, few rales , no crackles   Heart: Regular rate and rhythm, normal S1 S2, no murmur, no rub, no gallop, PMI is normal size and placement, carotid upstroke normal without bruit, jugular venous pressure normal Abdomen: Soft, non-tender, non-distended with normoactive bowel sounds. No hepatosplenomegaly. Abdominal aorta is normal size without bruit Extremities: Trace edema, no clubbing, no cyanosis, no ulcers,  Peripheral: 2+ radial, 2+ femoral, 2+ dorsal pedal pulses Neuro: Alert and oriented. Moves all extremities spontaneously. Psych:  Responds to questions appropriately with a normal affect.   Intake/Output Summary (Last 24 hours) at 02/01/16 0626 Last data filed at 02/01/16 0425  Gross per 24 hour  Intake 524.38 ml  Output   2525 ml  Net -2000.62 ml    Inpatient Medications:  . antiseptic oral rinse  7 mL Mouth Rinse q12n4p  . aspirin   81 mg Oral BH-q7a  . atorvastatin  40 mg Oral QPM  . chlorhexidine  15 mL Mouth Rinse BID  . clopidogrel  75 mg Oral BH-q7a  . finasteride  5 mg Oral QHS  . FLUoxetine  20 mg Oral BH-q7a  . furosemide  60 mg Intravenous BID  . gabapentin  600 mg Oral TID  . insulin aspart  0-15 Units Subcutaneous TID WC  . insulin aspart  0-5 Units Subcutaneous QHS  . insulin aspart  8 Units Subcutaneous TID WC  . insulin glargine  75 Units Subcutaneous QHS  . isosorbide mononitrate  60 mg Oral BID  . lisinopril  30 mg Oral q morning - 10a  . loratadine  10 mg Oral Daily  . metFORMIN  1,000 mg Oral BID WC  . metoprolol  50 mg Oral BID  . mometasone-formoterol  2 puff Inhalation BID  . pantoprazole  40 mg Oral Daily  . ranolazine  500 mg Oral BID  . sodium chloride flush  3 mL Intravenous Q12H  . sodium chloride flush  3 mL Intravenous Q12H  . spironolactone  25 mg Oral BH-q7a  . tamsulosin  0.4 mg Oral QPC supper   Infusions:  . heparin 1,250 Units/hr (01/31/16 0700)    Labs:  Recent Labs  01/31/16 0005 01/31/16 0518  NA 140 138  K 3.2* 3.7  CL 105 107  CO2 23 23  GLUCOSE 156* 71  BUN 25* 23*  CREATININE 1.35* 1.18  CALCIUM 9.2 8.8*  MG 1.3*  --     Recent Labs  01/31/16 0005  AST 31  ALT 25  ALKPHOS 70  BILITOT 0.6  PROT 8.3*  ALBUMIN 4.1    Recent Labs  01/31/16 0005 02/01/16 0544  WBC 14.4* 7.5  HGB 11.6* 9.5*  HCT 35.3* 28.2*  MCV 90.6 87.8  PLT 245 147*    Recent Labs  01/31/16 0005 01/31/16 0518 01/31/16 1048  TROPONINI 0.18* 1.87* 2.48*   Invalid input(s): POCBNP No results for input(s): HGBA1C in the last 72 hours.   Weights: Filed Weights   01/31/16 0000 01/31/16 0411  Weight: 252 lb (114.306 kg) 251 lb 15.8 oz (114.3 kg)     Radiology/Studies:  Dg Chest Portable 1 View  01/31/2016  CLINICAL DATA:  Respiratory distress tonight. Severe shortness of breath. History of CHF pulmonary edema. EXAM: PORTABLE CHEST 1 VIEW COMPARISON:  12/13/2015  FINDINGS: Left-sided pacemaker remains in place. Patient is post median sternotomy. Heart is mildly enlarged. Perihilar vascular haziness concerning for pulmonary edema. No large pleural effusion, left lower costophrenic angle excluded from the field of view. Suspect trace fluid in the right minor fissure. Bibasilar subsegmental atelectasis without confluent airspace disease. No pneumothorax. IMPRESSION: Cardiomegaly with perihilar pulmonary edema. Electronically Signed   By: Jeb Levering M.D.   On: 01/31/2016 01:07     Assessment and Recommendation  70 y.o. male with known acute on chronic systolic dysfunction congestive heart failure on appropriate medication management for diabetes hypertension hyperlipidemia with a non-ST elevation myocardial infarction 1. Continue dual antiplatelet therapy for non-ST elevation myocardial infarction 2. Proceed to cardiac catheterization to assess coronary anatomy graft anatomy and further treatment thereof is necessary. Patient understands the risk and benefits of cardiac catheterization. This includes possibility of death stroke heart attack infection bleeding or blood clot. The patient is at low risk for conscious sedation 3. High intensity cholesterol therapy 4. Further treatment options after above  Signed, [name] M.D. FACC

## 2016-02-01 NOTE — Progress Notes (Signed)
Patient is complaining of cough and is requesting for cough syrup. Dr. Jannifer Franklin informed with a new order for Robitussin 5 mL oral every 4 hours as needed.

## 2016-02-02 ENCOUNTER — Encounter
Admission: EM | Disposition: A | Discharge status: Home / Self Care | Payer: Self-pay | Source: Home / Self Care | Attending: Internal Medicine

## 2016-02-02 ENCOUNTER — Encounter: Payer: Self-pay | Admitting: Internal Medicine

## 2016-02-02 HISTORY — PX: CARDIAC CATHETERIZATION: SHX172

## 2016-02-02 LAB — GLUCOSE, CAPILLARY
GLUCOSE-CAPILLARY: 111 mg/dL — AB (ref 65–99)
Glucose-Capillary: 101 mg/dL — ABNORMAL HIGH (ref 65–99)

## 2016-02-02 LAB — BASIC METABOLIC PANEL
ANION GAP: 9 (ref 5–15)
BUN: 30 mg/dL — AB (ref 6–20)
CHLORIDE: 105 mmol/L (ref 101–111)
CO2: 26 mmol/L (ref 22–32)
Calcium: 8.6 mg/dL — ABNORMAL LOW (ref 8.9–10.3)
Creatinine, Ser: 1.34 mg/dL — ABNORMAL HIGH (ref 0.61–1.24)
GFR calc Af Amer: >60 mL/min (ref >60)
GFR calc non Af Amer: 52 mL/min — ABNORMAL LOW (ref >60)
GLUCOSE: 147 mg/dL — AB (ref 65–99)
POTASSIUM: 3.7 mmol/L (ref 3.5–5.1)
Sodium: 140 mmol/L (ref 135–145)

## 2016-02-02 LAB — CBC
HCT: 28.4 % — ABNORMAL LOW (ref 40.0–52.0)
HEMOGLOBIN: 9.7 g/dL — AB (ref 13.0–18.0)
MCH: 29.9 pg (ref 26.0–34.0)
MCHC: 34.1 g/dL (ref 32.0–36.0)
MCV: 87.6 fL (ref 80.0–100.0)
Platelets: 139 10*3/uL — ABNORMAL LOW (ref 150–440)
RBC: 3.24 MIL/uL — AB (ref 4.40–5.90)
RDW: 16.2 % — ABNORMAL HIGH (ref 11.5–14.5)
WBC: 7.8 10*3/uL (ref 3.8–10.6)

## 2016-02-02 LAB — HEPARIN LEVEL (UNFRACTIONATED): Heparin Unfractionated: 0.26 IU/mL — ABNORMAL LOW (ref 0.30–0.70)

## 2016-02-02 LAB — MAGNESIUM: MAGNESIUM: 1.1 mg/dL — AB (ref 1.7–2.4)

## 2016-02-02 SURGERY — LEFT HEART CATH AND CORONARY ANGIOGRAPHY
Anesthesia: Moderate Sedation

## 2016-02-02 MED ORDER — IOHEXOL 300 MG/ML  SOLN
INTRAMUSCULAR | Status: DC | PRN
  Administered 2016-02-02: 180 mL via INTRA_ARTERIAL

## 2016-02-02 MED ORDER — MIDAZOLAM HCL 2 MG/2ML IJ SOLN
INTRAMUSCULAR | Status: AC
  Filled 2016-02-02: qty 2

## 2016-02-02 MED ORDER — ACETAMINOPHEN 325 MG PO TABS: 650.0000 mg | ORAL_TABLET | Freq: Four times a day (QID) | ORAL | Status: DC | PRN

## 2016-02-02 MED ORDER — FENTANYL CITRATE (PF) 100 MCG/2ML IJ SOLN
INTRAMUSCULAR | Status: DC | PRN
  Administered 2016-02-02 (×2): 25 ug via INTRAVENOUS

## 2016-02-02 MED ORDER — MAGNESIUM SULFATE 4 GM/100ML IV SOLN
4.0000 g | Freq: Once | INTRAVENOUS | Status: AC
  Administered 2016-02-02: 4 g via INTRAVENOUS
  Filled 2016-02-02: qty 100

## 2016-02-02 MED ORDER — ASPIRIN 81 MG PO CHEW
81.0000 mg | CHEWABLE_TABLET | ORAL | Status: AC
  Administered 2016-02-02: 81 mg via ORAL

## 2016-02-02 MED ORDER — SODIUM CHLORIDE 0.9 % WEIGHT BASED INFUSION
3.0000 mL/kg/h | INTRAVENOUS | Status: DC
  Administered 2016-02-02: 3 mL/kg/h via INTRAVENOUS

## 2016-02-02 MED ORDER — FENTANYL CITRATE (PF) 100 MCG/2ML IJ SOLN
INTRAMUSCULAR | Status: AC
  Filled 2016-02-02: qty 2

## 2016-02-02 MED ORDER — GUAIFENESIN-DM 100-10 MG/5ML PO SYRP: 5.0000 mL | ORAL_SOLUTION | ORAL | Status: DC | PRN

## 2016-02-02 MED ORDER — SODIUM CHLORIDE 0.9% FLUSH
3.0000 mL | Freq: Two times a day (BID) | INTRAVENOUS | Status: DC
  Administered 2016-02-02: 3 mL via INTRAVENOUS

## 2016-02-02 MED ORDER — MIDAZOLAM HCL 2 MG/2ML IJ SOLN
INTRAMUSCULAR | Status: DC | PRN
  Administered 2016-02-02 (×2): 1 mg via INTRAVENOUS

## 2016-02-02 MED ORDER — SODIUM CHLORIDE 0.9 % IV SOLN: 250.0000 mL | INTRAVENOUS | Status: DC | PRN

## 2016-02-02 MED ORDER — HEPARIN (PORCINE) IN NACL 2-0.9 UNIT/ML-% IJ SOLN
INTRAMUSCULAR | Status: AC
  Filled 2016-02-02: qty 500

## 2016-02-02 MED ORDER — SODIUM CHLORIDE 0.9% FLUSH: 3.0000 mL | INTRAVENOUS | Status: DC | PRN

## 2016-02-02 MED ORDER — HEPARIN BOLUS VIA INFUSION
1400.0000 [IU] | Freq: Once | INTRAVENOUS | Status: AC
  Administered 2016-02-02: 1400 [IU] via INTRAVENOUS
  Filled 2016-02-02: qty 1400

## 2016-02-02 MED ORDER — INSULIN GLARGINE 100 UNIT/ML ~~LOC~~ SOLN: 75.0000 [IU] | Freq: Every day | SUBCUTANEOUS | Status: DC

## 2016-02-02 MED ORDER — SODIUM CHLORIDE 0.9 % WEIGHT BASED INFUSION
1.0000 mL/kg/h | INTRAVENOUS | Status: DC
  Administered 2016-02-02: 1 mL/kg/h via INTRAVENOUS

## 2016-02-02 SURGICAL SUPPLY — 9 items
CATH INFINITI 5FR ANG PIGTAIL (CATHETERS) ×3 IMPLANT
CATH INFINITI 5FR JL4 (CATHETERS) ×3 IMPLANT
CATH INFINITI JR4 5F (CATHETERS) ×3 IMPLANT
DEVICE CLOSURE MYNXGRIP 5F (Vascular Products) ×3 IMPLANT
KIT MANI 3VAL PERCEP (MISCELLANEOUS) ×3 IMPLANT
NEEDLE PERC 18GX7CM (NEEDLE) ×3 IMPLANT
PACK CARDIAC CATH (CUSTOM PROCEDURE TRAY) ×3 IMPLANT
SHEATH PINNACLE 5F 10CM (SHEATH) ×3 IMPLANT
WIRE EMERALD 3MM-J .035X150CM (WIRE) ×3 IMPLANT

## 2016-02-02 NOTE — Progress Notes (Signed)
ANTICOAGULATION CONSULT NOTE - Follow Up  Pharmacy Consult for heparin drip Indication: NSTEMI  Allergies  Allergen Reactions  . Benadryl [Diphenhydramine] Other (See Comments)    " Hyperactivity"  . Doxycycline Swelling    Pt went into pulmonary edema.  . Lopid [Gemfibrozil] Swelling    "I gain 1 pound a day for 30 days."    Patient Measurements: Height: 5\' 8"  (172.7 cm) Weight: 253 lb 4.8 oz (114.896 kg) IBW/kg (Calculated) : 68.4 Heparin Dosing Weight: 94kg  Vital Signs: Temp: 98.5 F (36.9 C) (02/27 0530) Temp Source: Oral (02/26 1947) BP: 112/48 mmHg (02/27 0530) Pulse Rate: 42 (02/27 0530)  Labs:  Recent Labs  01/31/16 0005 01/31/16 0518  01/31/16 1048  02/01/16 0544 02/01/16 1426 02/02/16 0411  HGB 11.6*  --   --   --   --  9.5*  --  9.7*  HCT 35.3*  --   --   --   --  28.2*  --  28.4*  PLT 245  --   --   --   --  147*  --  139*  APTT 26  --   --   --   --   --   --   --   LABPROT 15.1*  --   --   --   --   --   --   --   INR 1.17  --   --   --   --   --   --   --   HEPARINUNFRC  --   --   < >  --   < > 0.32 0.36 0.26*  CREATININE 1.35* 1.18  --   --   --   --   --  1.34*  TROPONINI 0.18* 1.87*  --  2.48*  --   --   --   --   < > = values in this interval not displayed.  Estimated Creatinine Clearance: 63.1 mL/min (by C-G formula based on Cr of 1.34).   Medical History: Past Medical History  Diagnosis Date  . Anemia   . Cancer ([place]) 12/2013    prostate  . Shortness of breath dyspnea   . COPD (chronic obstructive pulmonary disease) ([place])   . Cardiogenic pulmonary edema ([place]) 12/19/2014  . Diabetes mellitus without complication ([place])   . Hypertension   . Hypercholesteremia   . CHF (congestive heart failure) ([place])   . Cardiomyopathy, ischemic   . GERD (gastroesophageal reflux disease)   . Myocardial infarction [facility] [phone]    Assessment: Hgb 9.5  plt 147   INR 1.17  aPTT 26 Does not appear to have been on anticoagulation as  outpatient.  Goal of Therapy:  Heparin level 0.3-0.7 units/ml Monitor platelets by anticoagulation protocol: Yes   Plan:  Heparin level this morning still therapeutic at 0.32. Level appears to be trending down (0.48--> 0.39 --> 0.32) Will continue heparin infusion at 1250 units/hr, but will check a follow up level in 6-8 hours.   2/26 @1426 : Heparin level 0.36. Will continue to check daily heparin levels.   2/27 AM heparin level 0.26. 1400 unit bolus and increase to 1450 units/hr. Recheck in 6 hours.  Nancy Fetter, PharmD Pharmacy Resident  02/02/2016,5:33 AM

## 2016-02-02 NOTE — Progress Notes (Signed)
Pt discharged to home via wc.  Instructions and rx given to pt.  Questions answered.  No distress.  

## 2016-02-02 NOTE — Progress Notes (Signed)
[facility] Encounter Note  Patient: [name] / Admit Date: 01/30/2016 / Date of Encounter: 02/02/2016, 8:48 AM   Subjective: Patient is much better today and no evidence of chest pain or shortness of breath. Patient has had no symptoms with ambulation. Patient is somewhat weak  Review of Systems: Positive for: None Negative for: Vision change, hearing change, syncope, dizziness, nausea, vomiting,diarrhea, bloody stool, stomach pain, cough, congestion, diaphoresis, urinary frequency, urinary pain,skin lesions, skin rashes Others previously listed  Objective: Telemetry: Normal sinus rhythm Physical Exam: Blood pressure 117/64, pulse 86, temperature 98.5 F (36.9 C), temperature source Oral, resp. rate 20, height 5\' 8"  (1.727 m), weight 253 lb 1.6 oz (114.805 kg), SpO2 94 %. Body mass index is 38.49 kg/(m^2). General: Well developed, well nourished, in no acute distress. Head: Normocephalic, atraumatic, sclera non-icteric, no xanthomas, nares are without discharge. Neck: No apparent masses Lungs: Normal respirations with no wheezes, no rhonchi, few rales , no crackles   Heart: Regular rate and rhythm, normal S1 S2, no murmur, no rub, no gallop, PMI is normal size and placement, carotid upstroke normal without bruit, jugular venous pressure normal Abdomen: Soft, non-tender, non-distended with normoactive bowel sounds. No hepatosplenomegaly. Abdominal aorta is normal size without bruit Extremities: Trace edema, no clubbing, no cyanosis, no ulcers,  Peripheral: 2+ radial, 2+ femoral, 2+ dorsal pedal pulses Neuro: Alert and oriented. Moves all extremities spontaneously. Psych:  Responds to questions appropriately with a normal affect.   Intake/Output Summary (Last 24 hours) at 02/02/16 0848 Last data filed at 02/02/16 0700  Gross per 24 hour  Intake 1061.44 ml  Output   2525 ml  Net -1463.56 ml    Inpatient Medications:  . [facility] Hold] antiseptic oral  rinse  7 mL Mouth Rinse q12n4p  . [MAR Hold] aspirin  81 mg Oral BH-q7a  . [MAR Hold] atorvastatin  40 mg Oral QPM  . [MAR Hold] chlorhexidine  15 mL Mouth Rinse BID  . [MAR Hold] clopidogrel  75 mg Oral BH-q7a  . [MAR Hold] finasteride  5 mg Oral QHS  . [MAR Hold] FLUoxetine  20 mg Oral BH-q7a  . [MAR Hold] furosemide  60 mg Intravenous BID  . [MAR Hold] gabapentin  600 mg Oral TID  . [MAR Hold] insulin aspart  0-15 Units Subcutaneous TID WC  . [MAR Hold] insulin aspart  0-5 Units Subcutaneous QHS  . [MAR Hold] insulin aspart  8 Units Subcutaneous TID WC  . [MAR Hold] insulin glargine  75 Units Subcutaneous QHS  . [MAR Hold] isosorbide mononitrate  60 mg Oral BID  . [MAR Hold] lisinopril  30 mg Oral q morning - 10a  . [MAR Hold] loratadine  10 mg Oral Daily  . [MAR Hold] metFORMIN  1,000 mg Oral BID WC  . [MAR Hold] metoprolol  50 mg Oral BID  . [MAR Hold] mometasone-formoterol  2 puff Inhalation BID  . [MAR Hold] pantoprazole  40 mg Oral Daily  . [MAR Hold] ranolazine  500 mg Oral BID  . [MAR Hold] sodium chloride flush  3 mL Intravenous Q12H  . [MAR Hold] sodium chloride flush  3 mL Intravenous Q12H  . sodium chloride flush  3 mL Intravenous Q12H  . [MAR Hold] spironolactone  25 mg Oral BH-q7a  . [MAR Hold] tamsulosin  0.4 mg Oral QPC supper   Infusions:  . [START ON 02/03/2016] sodium chloride 3 mL/kg/hr (02/02/16 [id number])   Followed by  . [START ON 02/03/2016] sodium chloride 1 mL/kg/hr (  02/02/16 0703)  . heparin Stopped (02/02/16 0720)    Labs:  Recent Labs  01/31/16 0005 01/31/16 0518 02/02/16 0411  NA 140 138 140  K 3.2* 3.7 3.7  CL 105 107 105  CO2 23 23 26   GLUCOSE 156* 71 147*  BUN 25* 23* 30*  CREATININE 1.35* 1.18 1.34*  CALCIUM 9.2 8.8* 8.6*  MG 1.3*  --  1.1*    Recent Labs  01/31/16 0005  AST 31  ALT 25  ALKPHOS 70  BILITOT 0.6  PROT 8.3*  ALBUMIN 4.1    Recent Labs  02/01/16 0544 02/02/16 0411  WBC 7.5 7.8  HGB 9.5* 9.7*  HCT 28.2*  28.4*  MCV 87.8 87.6  PLT 147* 139*    Recent Labs  01/31/16 0005 01/31/16 0518 01/31/16 1048  TROPONINI 0.18* 1.87* 2.48*   Invalid input(s): POCBNP No results for input(s): HGBA1C in the last 72 hours.   Weights: Filed Weights   01/31/16 0411 02/01/16 0659 02/02/16 0530  Weight: 251 lb 15.8 oz (114.3 kg) 253 lb 4.8 oz (114.896 kg) 253 lb 1.6 oz (114.805 kg)     Radiology/Studies:  Dg Chest Portable 1 View  01/31/2016  CLINICAL DATA:  Respiratory distress tonight. Severe shortness of breath. History of CHF pulmonary edema. EXAM: PORTABLE CHEST 1 VIEW COMPARISON:  12/13/2015 FINDINGS: Left-sided pacemaker remains in place. Patient is post median sternotomy. Heart is mildly enlarged. Perihilar vascular haziness concerning for pulmonary edema. No large pleural effusion, left lower costophrenic angle excluded from the field of view. Suspect trace fluid in the right minor fissure. Bibasilar subsegmental atelectasis without confluent airspace disease. No pneumothorax. IMPRESSION: Cardiomegaly with perihilar pulmonary edema. Electronically Signed   By: Jeb Levering M.D.   On: 01/31/2016 01:07     Assessment and Recommendation  70 y.o. male with known acute on chronic systolic dysfunction congestive heart failure on appropriate medication management for diabetes hypertension hyperlipidemia with a non-ST elevation myocardial infarction Cardiac catheterization shows moderate global LV systolic dysfunction with ejection fraction of 35%. 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 Bypass graft ramus diagonal and LAD are patent and normal. Bypass graft with stent of the ostium to the right coronary artery has a moderate stenosis but nothing appears to be critical at this time and may need further evaluation 1. Continue dual antiplatelet therapy for non-ST elevation myocardial infarction 2. Patient will  need a physiologic study of the graft to the right coronary artery to assess for the possibility of ischemia with a moderate stenosis at the stent site 3. High intensity cholesterol therapy 4. Begin ambulation and follow for improvements on medication management including isosorbide  Signed, [name] M.D. FACC

## 2016-02-02 NOTE — Care Management (Signed)
CM informed by care team that there are no discharge concerns identified.  Patient with history of chronic chf and on home cpap.  Does not require continuous 02.  Patient does have history of frequent presentations in Epic.  A referral has been made to the [facility].  No concerns identified with compliance or accessing medical care.

## 2016-02-02 NOTE — Progress Notes (Signed)
Initial appointment scheduled at the [facility] on February 13, 2016 at 11:00am. Thank you.

## 2016-02-02 NOTE — Plan of Care (Signed)
Problem: Safety: Goal: Ability to remain free from injury will improve Outcome: Progressing Fall precautions in place  Problem: Tissue Perfusion: Goal: Risk factors for ineffective tissue perfusion will decrease Outcome: Progressing Heparin gtt

## 2016-02-02 NOTE — Discharge Instructions (Signed)
Heart Failure Clinic appointment on February 13, 2016 at 11:00am with Darylene Price, [place]. Please call [phone] to reschedule.  Outpatient diabetic clinic-lifestyle in 3-4 days Activity as tolerated Diet-heart healthy and diabetic Follow-up with primary care physician in a week Follow-up with Dr. Nehemiah Massed on March 6 at 1:30 PM Continue CPAP daily at bedtime

## 2016-02-02 NOTE — Progress Notes (Signed)
Pt returned from cath lab s/p heart catheterization, dressing to rt groin dry and intact, pulses equal bil.  IVF infusing well rt fa.  No distress on ra.  Cardiac monitor in place, pt denies chest pain.  Lungs diminished lower lobes bil.  Wife at bedside.  Denies need at this time. CB in reach, SR up x2.

## 2016-02-02 NOTE — Discharge Summary (Signed)
[facility] NAME: [name]    MR#:  [id number]  DATE OF BIRTH:  [birth year]  DATE OF ADMISSION:  01/30/2016 ADMITTING PHYSICIAN: Hillary Bow, MD  DATE OF DISCHARGE: 02/02/16 PRIMARY CARE PHYSICIAN: [name], MD    ADMISSION DIAGNOSIS:  Acute pulmonary edema (HCC) [J81.0] NSTEMI (non-ST elevated myocardial infarction) ([place]) [I21.4] Acute respiratory failure, unspecified whether with hypoxia or hypercapnia (HCC) [J96.00] Acute congestive heart failure, unspecified congestive heart failure type ([place]) [I50.9]  DISCHARGE DIAGNOSIS:  Active Problems:   CHF (congestive heart failure) (HCC)  non-STEMI Obstructive sleep apnea Nonsustained V. tach  SECONDARY DIAGNOSIS:   Past Medical History  Diagnosis Date  . Anemia   . Cancer ([place]) 12/2013    prostate  . Shortness of breath dyspnea   . COPD (chronic obstructive pulmonary disease) ([place])   . Cardiogenic pulmonary edema ([place]) 12/19/2014  . Diabetes mellitus without complication ([place])   . Hypertension   . Hypercholesteremia   . CHF (congestive heart failure) ([place])   . Cardiomyopathy, ischemic   . GERD (gastroesophageal reflux disease)   . Myocardial infarction [facility]) [place]:   * N STEMI Had cardiac catheterization today, tolerated procedure well. Okay to discharge patient from cardiology standpoint with medical management at this point Patient needs further evaluation and possible stress test or other invasive procedure for further evaluation of the graft and is to be followed up by Dr. Nehemiah Massed on March 6 at 1:30 PM  chest pain improved with heparin drip which is discontinued prior to cardiac catheterizationarin Continue aspirin, statin, heparin drip and beta blocker. Appreciate [place] recommendations.  *NSVTach Cardio recommending cardiac cath in am, tele monitoring No need of adding any new meds at this time   Acute on  chronic systolic CHF with acute hypoxic respiratory failure - Improved with IV Lasix, back to Lasix by mouth, continue Beta blockers - Input and Output, daily weights monitoring - Counseled to limit fluids and Salt intake - Monitor Bun/Cr and Potassium -Cardiology f/u with Dr. 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  * Diabetes mellitus, insulin-dependent Continue home dose of Lantus at 75 units but doesn't want and pre-meal NovoLog.  * DVT prophylaxis -PROVIDED with heparin drip      DISCHARGE CONDITIONS:   FAIR  CONSULTS OBTAINED:  Treatment Team:  [name], MD   PROCEDURES CARDIAC CATH shows moderate global LV systolic dysfunction with ejection fraction of 35%. 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 Benadryl [Diphenhydramine] Other (See Comments)    " Hyperactivity"  . Doxycycline Swelling    Pt went into pulmonary edema.  . Lopid [Gemfibrozil] Swelling    "I gain 1 pound a day for 30 days."    DISCHARGE MEDICATIONS:   Current Discharge Medication List    START taking these medications   Details  acetaminophen (TYLENOL) 325 MG tablet Take 2 tablets (650 mg total) by mouth every 6 (six) hours as needed for mild pain (or Fever >/= 101).    guaiFENesin-dextromethorphan (ROBITUSSIN DM) 100-10 MG/5ML  syrup Take 5 mLs by mouth every 4 (four) hours as needed for cough. Qty: 118 mL, Refills: 0      CONTINUE these medications which have CHANGED   Details  insulin glargine (LANTUS) 100 UNIT/ML injection Inject 0.75 mLs (75 Units total) into the skin at  bedtime. Qty: 10 mL, Refills: 11      CONTINUE these medications which have NOT CHANGED   Details  albuterol (PROVENTIL HFA;VENTOLIN HFA) 108 (90 Base) MCG/ACT inhaler Inhale 2 puffs into the lungs 3 (three) times daily as needed for wheezing or shortness of breath.    aspirin 81 MG tablet Take 81 mg by mouth every morning.    atorvastatin (LIPITOR) 40 MG tablet Take 40 mg by mouth every evening.    budesonide-formoterol (SYMBICORT) 80-4.5 MCG/ACT inhaler Inhale 2 puffs into the lungs 2 (two) times daily.    clopidogrel (PLAVIX) 75 MG tablet Take 75 mg by mouth every morning.    dexlansoprazole (DEXILANT) 60 MG capsule Take 60 mg by mouth daily.    ferrous sulfate 325 (65 FE) MG tablet Take 325 mg by mouth 2 (two) times daily with a meal.    finasteride (PROSCAR) 5 MG tablet Take 5 mg by mouth at bedtime.    FLUoxetine (PROZAC) 20 MG tablet Take 20 mg by mouth every morning.    furosemide (LASIX) 40 MG tablet Take 40 mg by mouth every morning.    gabapentin (NEURONTIN) 300 MG capsule Take 600 mg by mouth 3 (three) times daily.    insulin lispro (HUMALOG) 100 UNIT/ML injection Inject into the skin 3 (three) times daily before meals. Sliding scale based on blood sugar results, 8-14 units.    isosorbide mononitrate (IMDUR) 60 MG 24 hr tablet Take 60 mg by mouth 2 (two) times daily.    lisinopril (PRINIVIL,ZESTRIL) 30 MG tablet Take 30 mg by mouth every morning.    loratadine (CLARITIN) 10 MG tablet Take 10 mg by mouth daily.    metFORMIN (GLUCOPHAGE) 1000 MG tablet Take 1,000 mg by mouth 2 (two) times daily with a meal.     metoprolol (LOPRESSOR) 50 MG tablet Take 50 mg by mouth 2 (two) times daily.    Multiple Vitamins-Minerals (CENTRUM SILVER PO) Take 1 tablet by mouth every morning.    ranolazine (RANEXA) 500 MG 12 hr tablet Take 500 mg by mouth 2 (two) times daily.    spironolactone (ALDACTONE) 25 MG tablet Take 25 mg by mouth every morning.    tamsulosin (FLOMAX) 0.4  MG CAPS capsule Take 0.4 mg by mouth daily after supper.    nitroGLYCERIN (NITROSTAT) 0.4 MG SL tablet Place 0.4 mg under the tongue every 5 (five) minutes as needed for chest pain.      STOP taking these medications     cephALEXin (KEFLEX) 250 MG capsule          DISCHARGE INSTRUCTIONS:  Heart Failure Clinic appointment on February 13, 2016 at 11:00am with Darylene Price, [place]. Please call [phone] to reschedule.  Outpatient diabetic clinic-lifestyle in 3-4 days Activity as tolerated Diet-heart healthy and diabetic Follow-up with primary care physician in a week Follow-up with Dr. Nehemiah Massed on March 6 at 1:30 PM Continue CPAP daily at bedtime   DIET:  Cardiac diet, diabetic  DISCHARGE CONDITION:  Fair  ACTIVITY:  Activity as tolerated  OXYGEN:  Home Oxygen: Yes.     Oxygen Delivery: 5 liters/min via cpap q hs  DISCHARGE LOCATION:  home   If you experience worsening of  your admission symptoms, develop shortness of breath, life threatening emergency, suicidal or homicidal thoughts you must seek medical attention immediately by calling 911 or calling your MD immediately  if symptoms less severe.  You Must read complete instructions/literature along with all the possible adverse reactions/side effects for all the Medicines you take and that have been prescribed to you. Take any new Medicines after you have completely understood and accpet all the possible adverse reactions/side effects.   Please note  You were cared for by a hospitalist during your hospital stay. If you have any questions about your discharge medications or the care you received while you were in the hospital after you are discharged, you can call the unit and asked to speak with the hospitalist on call if the hospitalist that took care of you is not available. Once you are discharged, your primary care physician will handle any further medical issues. Please note that NO REFILLS for any discharge medications  will be authorized once you are discharged, as it is imperative that you return to your primary care physician (or establish a relationship with a primary care physician if you do not have one) for your aftercare needs so that they can reassess your need for medications and monitor your lab values.     Today  Chief Complaint  Patient presents with  . Respiratory Distress   Patient returned back from cardiac catheterization. Resting comfortably. Denies any chest pain or shortness of breath and wants to go home  ROS:  CONSTITUTIONAL: Denies fevers, chills. Denies any fatigue, weakness.  EYES: Denies blurry vision, double vision, eye pain. EARS, NOSE, THROAT: Denies tinnitus, ear pain, hearing loss. RESPIRATORY: Denies cough, wheeze, shortness of breath.  CARDIOVASCULAR: Denies chest pain, palpitations, edema.  GASTROINTESTINAL: Denies nausea, vomiting, diarrhea, abdominal pain. Denies bright red blood per rectum. GENITOURINARY: Denies dysuria, hematuria. ENDOCRINE: Denies nocturia or thyroid problems. HEMATOLOGIC AND LYMPHATIC: Denies easy bruising or bleeding. SKIN: Denies rash or lesion. MUSCULOSKELETAL: Right groin with clean dressing at cardiac cathe Site Denies pain in neck, back, shoulder, knees, hips or arthritic symptoms.  NEUROLOGIC: Denies paralysis, paresthesias.  PSYCHIATRIC: Denies anxiety or depressive symptoms.   VITAL SIGNS:  Blood pressure 113/65, pulse 42, temperature 98.2 F (36.8 C), temperature source Oral, resp. rate 20, height 5\' 8"  (1.727 m), weight 114.805 kg (253 lb 1.6 oz), SpO2 97 %.  I/O:    Intake/Output Summary (Last 24 hours) at 02/02/16 1400 Last data filed at 02/02/16 1341  Gross per 24 hour  Intake 1014.44 ml  Output   3050 ml  Net -2035.56 ml    PHYSICAL EXAMINATION:  GENERAL:  70 y.o.-year-old patient lying in the bed with no acute distress.  EYES: Pupils equal, round, reactive to light and accommodation. No scleral icterus. Extraocular  muscles intact.  HEENT: Head atraumatic, normocephalic. Oropharynx and nasopharynx clear.  NECK:  Supple, no jugular venous distention. No thyroid enlargement, no tenderness.  LUNGS: Normal breath sounds bilaterally, no wheezing, rales,rhonchi or crepitation. No use of accessory muscles of respiration.  CARDIOVASCULAR: S1, S2 normal. No murmurs, rubs, or gallops.  ABDOMEN: Soft, non-tender, non-distended. Bowel sounds present. No organomegaly or mass.  EXTREMITIES: Right groin with clean dressing at cardiac cathe Site No pedal edema, cyanosis, or clubbing.  NEUROLOGIC: Cranial nerves II through XII are intact. Muscle strength 5/5 in all extremities. Sensation intact. Gait not checked.  PSYCHIATRIC: The patient is alert and oriented x 3.  SKIN: No obvious rash, lesion, or ulcer.   DATA  REVIEW:   CBC  Recent Labs Lab 02/02/16 0411  WBC 7.8  HGB 9.7*  HCT 28.4*  PLT 139*    Chemistries   Recent Labs Lab 01/31/16 0005  02/02/16 0411  NA 140  < > 140  K 3.2*  < > 3.7  CL 105  < > 105  CO2 23  < > 26  GLUCOSE 156*  < > 147*  BUN 25*  < > 30*  CREATININE 1.35*  < > 1.34*  CALCIUM 9.2  < > 8.6*  MG 1.3*  --  1.1*  AST 31  --   --   ALT 25  --   --   ALKPHOS 70  --   --   BILITOT 0.6  --   --   < > = values in this interval not displayed.  Cardiac Enzymes  Recent Labs Lab 01/31/16 1048  TROPONINI 2.48*    Microbiology Results  Results for orders placed or performed during the hospital encounter of 01/30/16  MRSA PCR Screening     Status: None   Collection Time: 01/31/16  3:28 AM  Result Value Ref Range Status   MRSA by PCR NEGATIVE NEGATIVE Final    Comment:        The GeneXpert MRSA Assay (FDA approved for NASAL specimens only), is one component of a comprehensive MRSA colonization surveillance program. It is not intended to diagnose MRSA infection nor to guide or monitor treatment for MRSA infections.     RADIOLOGY:  Dg Chest Portable 1  View  01/31/2016  CLINICAL DATA:  Respiratory distress tonight. Severe shortness of breath. History of CHF pulmonary edema. EXAM: PORTABLE CHEST 1 VIEW COMPARISON:  12/13/2015 FINDINGS: Left-sided pacemaker remains in place. Patient is post median sternotomy. Heart is mildly enlarged. Perihilar vascular haziness concerning for pulmonary edema. No large pleural effusion, left lower costophrenic angle excluded from the field of view. Suspect trace fluid in the right minor fissure. Bibasilar subsegmental atelectasis without confluent airspace disease. No pneumothorax. IMPRESSION: Cardiomegaly with perihilar pulmonary edema. Electronically Signed   By: Jeb Levering M.D.   On: 01/31/2016 01:07    EKG:   Orders placed or performed during the hospital encounter of 01/30/16  . EKG 12-Lead  . EKG 12-Lead  . EKG 12-Lead  . EKG 12-Lead      Management plans discussed with the patient, family and they are in agreement.  CODE STATUS:     Code Status Orders        Start     Ordered   01/31/16 0143  Full code   Continuous     01/31/16 0143    Code Status History    Date Active Date Inactive Code Status Order ID Comments User Context   12/12/2015  4:27 PM 12/13/2015  3:29 PM Full Code [id number]  [name], MD Inpatient      TOTAL TIME TAKING CARE OF THIS PATIENT: 45  minutes.    @[facility] @  on 02/02/2016 at 2:00 PM  Between 7am to 6pm - Pager - [phone]  After 6pm go to [email or web address] - password [email or web address] [facility]  Office  [phone]  CC: Primary care physician; [name], MD

## 2016-02-02 NOTE — Care Management Important Message (Signed)
Important Message  Patient Details  Name: [name] MRN: [id number] Date of Birth: 1946-10-27   Medicare Important Message Given:  Yes    Juliann Pulse A Kayliana Codd 02/02/2016, 10:35 AM

## 2016-02-02 NOTE — Progress Notes (Signed)
To cath lab via bed.

## 2016-02-03 LAB — GLUCOSE, CAPILLARY: GLUCOSE-CAPILLARY: 161 mg/dL — AB (ref 65–99)

## 2016-02-04 ENCOUNTER — Emergency Department
Admission: EM | Admit: 2016-02-04 | Discharge: 2016-02-05 | Disposition: A | Discharge status: Home / Self Care | Payer: PPO | Attending: Emergency Medicine | Admitting: Emergency Medicine

## 2016-02-04 ENCOUNTER — Emergency Department: Payer: PPO

## 2016-02-04 ENCOUNTER — Encounter: Payer: Self-pay | Admitting: Emergency Medicine

## 2016-02-04 DIAGNOSIS — Z794 Long term (current) use of insulin: Secondary | ICD-10-CM | POA: Diagnosis not present

## 2016-02-04 DIAGNOSIS — Z7982 Long term (current) use of aspirin: Secondary | ICD-10-CM | POA: Insufficient documentation

## 2016-02-04 DIAGNOSIS — Z7984 Long term (current) use of oral hypoglycemic drugs: Secondary | ICD-10-CM | POA: Insufficient documentation

## 2016-02-04 DIAGNOSIS — Z79899 Other long term (current) drug therapy: Secondary | ICD-10-CM | POA: Diagnosis not present

## 2016-02-04 DIAGNOSIS — E1140 Type 2 diabetes mellitus with diabetic neuropathy, unspecified: Secondary | ICD-10-CM | POA: Diagnosis not present

## 2016-02-04 DIAGNOSIS — Z7902 Long term (current) use of antithrombotics/antiplatelets: Secondary | ICD-10-CM | POA: Insufficient documentation

## 2016-02-04 DIAGNOSIS — R0602 Shortness of breath: Secondary | ICD-10-CM | POA: Diagnosis not present

## 2016-02-04 DIAGNOSIS — J440 Chronic obstructive pulmonary disease with acute lower respiratory infection: Secondary | ICD-10-CM | POA: Insufficient documentation

## 2016-02-04 DIAGNOSIS — J811 Chronic pulmonary edema: Secondary | ICD-10-CM | POA: Diagnosis not present

## 2016-02-04 DIAGNOSIS — J9801 Acute bronchospasm: Secondary | ICD-10-CM | POA: Diagnosis not present

## 2016-02-04 DIAGNOSIS — I10 Essential (primary) hypertension: Secondary | ICD-10-CM | POA: Insufficient documentation

## 2016-02-04 DIAGNOSIS — R0600 Dyspnea, unspecified: Secondary | ICD-10-CM

## 2016-02-04 DIAGNOSIS — Z7951 Long term (current) use of inhaled steroids: Secondary | ICD-10-CM | POA: Diagnosis not present

## 2016-02-04 DIAGNOSIS — Z87891 Personal history of nicotine dependence: Secondary | ICD-10-CM | POA: Insufficient documentation

## 2016-02-04 NOTE — ED Notes (Signed)
Pt presents to ED with sob. States he was recently discharged from this hospital Monday for the same and followed up with the [facility] today . Sob increased tonight approx 60 min ago. Denies chest pain currently. Increased work of breathing noted at this time. Currently on 3L of O2. 3 doses of combivent at home which.

## 2016-02-05 ENCOUNTER — Emergency Department: Payer: PPO

## 2016-02-05 DIAGNOSIS — I5023 Acute on chronic systolic (congestive) heart failure: Secondary | ICD-10-CM | POA: Diagnosis not present

## 2016-02-05 DIAGNOSIS — Z6841 Body Mass Index (BMI) 40.0 and over, adult: Secondary | ICD-10-CM | POA: Diagnosis not present

## 2016-02-05 DIAGNOSIS — Z0389 Encounter for observation for other suspected diseases and conditions ruled out: Secondary | ICD-10-CM | POA: Diagnosis not present

## 2016-02-05 DIAGNOSIS — R062 Wheezing: Secondary | ICD-10-CM | POA: Diagnosis not present

## 2016-02-05 DIAGNOSIS — E7800 Pure hypercholesterolemia, unspecified: Secondary | ICD-10-CM | POA: Diagnosis not present

## 2016-02-05 DIAGNOSIS — R0602 Shortness of breath: Secondary | ICD-10-CM | POA: Diagnosis not present

## 2016-02-05 DIAGNOSIS — I9581 Postprocedural hypotension: Secondary | ICD-10-CM | POA: Diagnosis not present

## 2016-02-05 DIAGNOSIS — E785 Hyperlipidemia, unspecified: Secondary | ICD-10-CM | POA: Diagnosis not present

## 2016-02-05 DIAGNOSIS — F1021 Alcohol dependence, in remission: Secondary | ICD-10-CM | POA: Diagnosis not present

## 2016-02-05 DIAGNOSIS — Z87891 Personal history of nicotine dependence: Secondary | ICD-10-CM | POA: Diagnosis not present

## 2016-02-05 DIAGNOSIS — N400 Enlarged prostate without lower urinary tract symptoms: Secondary | ICD-10-CM | POA: Diagnosis not present

## 2016-02-05 DIAGNOSIS — Z794 Long term (current) use of insulin: Secondary | ICD-10-CM | POA: Diagnosis not present

## 2016-02-05 DIAGNOSIS — R7989 Other specified abnormal findings of blood chemistry: Secondary | ICD-10-CM | POA: Diagnosis not present

## 2016-02-05 DIAGNOSIS — E1121 Type 2 diabetes mellitus with diabetic nephropathy: Secondary | ICD-10-CM | POA: Diagnosis not present

## 2016-02-05 DIAGNOSIS — R600 Localized edema: Secondary | ICD-10-CM | POA: Diagnosis not present

## 2016-02-05 DIAGNOSIS — R918 Other nonspecific abnormal finding of lung field: Secondary | ICD-10-CM | POA: Diagnosis not present

## 2016-02-05 DIAGNOSIS — Z4502 Encounter for adjustment and management of automatic implantable cardiac defibrillator: Secondary | ICD-10-CM | POA: Diagnosis not present

## 2016-02-05 DIAGNOSIS — J449 Chronic obstructive pulmonary disease, unspecified: Secondary | ICD-10-CM | POA: Diagnosis not present

## 2016-02-05 DIAGNOSIS — I481 Persistent atrial fibrillation: Secondary | ICD-10-CM | POA: Diagnosis not present

## 2016-02-05 DIAGNOSIS — R0600 Dyspnea, unspecified: Secondary | ICD-10-CM | POA: Diagnosis not present

## 2016-02-05 DIAGNOSIS — I214 Non-ST elevation (NSTEMI) myocardial infarction: Secondary | ICD-10-CM | POA: Diagnosis not present

## 2016-02-05 DIAGNOSIS — Z950 Presence of cardiac pacemaker: Secondary | ICD-10-CM | POA: Diagnosis not present

## 2016-02-05 DIAGNOSIS — I447 Left bundle-branch block, unspecified: Secondary | ICD-10-CM | POA: Diagnosis not present

## 2016-02-05 DIAGNOSIS — I110 Hypertensive heart disease with heart failure: Secondary | ICD-10-CM | POA: Diagnosis not present

## 2016-02-05 DIAGNOSIS — Z95810 Presence of automatic (implantable) cardiac defibrillator: Secondary | ICD-10-CM | POA: Diagnosis not present

## 2016-02-05 DIAGNOSIS — E1142 Type 2 diabetes mellitus with diabetic polyneuropathy: Secondary | ICD-10-CM | POA: Diagnosis not present

## 2016-02-05 DIAGNOSIS — G4733 Obstructive sleep apnea (adult) (pediatric): Secondary | ICD-10-CM | POA: Diagnosis not present

## 2016-02-05 DIAGNOSIS — I10 Essential (primary) hypertension: Secondary | ICD-10-CM | POA: Diagnosis not present

## 2016-02-05 DIAGNOSIS — Z8546 Personal history of malignant neoplasm of prostate: Secondary | ICD-10-CM | POA: Diagnosis not present

## 2016-02-05 DIAGNOSIS — K219 Gastro-esophageal reflux disease without esophagitis: Secondary | ICD-10-CM | POA: Diagnosis not present

## 2016-02-05 DIAGNOSIS — Z006 Encounter for examination for normal comparison and control in clinical research program: Secondary | ICD-10-CM | POA: Diagnosis not present

## 2016-02-05 DIAGNOSIS — I255 Ischemic cardiomyopathy: Secondary | ICD-10-CM | POA: Diagnosis not present

## 2016-02-05 DIAGNOSIS — R0989 Other specified symptoms and signs involving the circulatory and respiratory systems: Secondary | ICD-10-CM | POA: Diagnosis not present

## 2016-02-05 DIAGNOSIS — I2510 Atherosclerotic heart disease of native coronary artery without angina pectoris: Secondary | ICD-10-CM | POA: Diagnosis not present

## 2016-02-05 DIAGNOSIS — Z951 Presence of aortocoronary bypass graft: Secondary | ICD-10-CM | POA: Diagnosis not present

## 2016-02-05 DIAGNOSIS — I272 Other secondary pulmonary hypertension: Secondary | ICD-10-CM | POA: Diagnosis not present

## 2016-02-05 DIAGNOSIS — J9600 Acute respiratory failure, unspecified whether with hypoxia or hypercapnia: Secondary | ICD-10-CM | POA: Diagnosis not present

## 2016-02-05 DIAGNOSIS — J440 Chronic obstructive pulmonary disease with acute lower respiratory infection: Secondary | ICD-10-CM | POA: Diagnosis not present

## 2016-02-05 DIAGNOSIS — I420 Dilated cardiomyopathy: Secondary | ICD-10-CM | POA: Diagnosis not present

## 2016-02-05 DIAGNOSIS — I5022 Chronic systolic (congestive) heart failure: Secondary | ICD-10-CM | POA: Diagnosis not present

## 2016-02-05 DIAGNOSIS — I252 Old myocardial infarction: Secondary | ICD-10-CM | POA: Diagnosis not present

## 2016-02-05 LAB — COMPREHENSIVE METABOLIC PANEL
ALBUMIN: 3.6 g/dL (ref 3.5–5.0)
ALT: 26 U/L (ref 17–63)
ANION GAP: 12 (ref 5–15)
AST: 26 U/L (ref 15–41)
Alkaline Phosphatase: 58 U/L (ref 38–126)
BUN: 38 mg/dL — ABNORMAL HIGH (ref 6–20)
CALCIUM: 8.6 mg/dL — AB (ref 8.9–10.3)
CHLORIDE: 105 mmol/L (ref 101–111)
CO2: 21 mmol/L — AB (ref 22–32)
Creatinine, Ser: 1.49 mg/dL — ABNORMAL HIGH (ref 0.61–1.24)
GFR calc non Af Amer: 46 mL/min — ABNORMAL LOW (ref >60)
GFR, EST AFRICAN AMERICAN: 53 mL/min — AB (ref >60)
Glucose, Bld: 122 mg/dL — ABNORMAL HIGH (ref 65–99)
POTASSIUM: 3.8 mmol/L (ref 3.5–5.1)
SODIUM: 138 mmol/L (ref 135–145)
Total Bilirubin: 0.8 mg/dL (ref 0.3–1.2)
Total Protein: 7.2 g/dL (ref 6.5–8.1)

## 2016-02-05 LAB — CBC WITH DIFFERENTIAL/PLATELET
BASOS PCT: 2 %
Basophils Absolute: 0.2 10*3/uL — ABNORMAL HIGH (ref 0–0.1)
Eosinophils Absolute: 0.1 10*3/uL (ref 0–0.7)
Eosinophils Relative: 2 %
HCT: 27.9 % — ABNORMAL LOW (ref 40.0–52.0)
HEMOGLOBIN: 9.4 g/dL — AB (ref 13.0–18.0)
LYMPHS ABS: 0.7 10*3/uL — AB (ref 1.0–3.6)
LYMPHS PCT: 8 %
MCH: 29.8 pg (ref 26.0–34.0)
MCHC: 33.8 g/dL (ref 32.0–36.0)
MCV: 88.2 fL (ref 80.0–100.0)
MONO ABS: 0.4 10*3/uL (ref 0.2–1.0)
MONOS PCT: 4 %
NEUTROS ABS: 7.5 10*3/uL — AB (ref 1.4–6.5)
NEUTROS PCT: 84 %
Platelets: 156 10*3/uL (ref 150–440)
RBC: 3.16 MIL/uL — ABNORMAL LOW (ref 4.40–5.90)
RDW: 15.6 % — ABNORMAL HIGH (ref 11.5–14.5)
WBC: 8.9 10*3/uL (ref 3.8–10.6)

## 2016-02-05 LAB — TROPONIN I: Troponin I: 0.49 ng/mL — ABNORMAL HIGH (ref <0.031)

## 2016-02-05 LAB — BRAIN NATRIURETIC PEPTIDE: B NATRIURETIC PEPTIDE 5: 422 pg/mL — AB (ref 0.0–100.0)

## 2016-02-05 MED ORDER — IPRATROPIUM-ALBUTEROL 0.5-2.5 (3) MG/3ML IN SOLN
3.0000 mL | Freq: Once | RESPIRATORY_TRACT | Status: AC
  Administered 2016-02-05: 3 mL via RESPIRATORY_TRACT
  Filled 2016-02-05: qty 3

## 2016-02-05 NOTE — ED Notes (Signed)
Pt reports getting ready for bed and having SOB. Pt reports being here a few days for the same. Pt in no acute distress o2 sats 100 %

## 2016-02-05 NOTE — Discharge Instructions (Signed)
Shortness of Breath Shortness of breath means you have trouble breathing. It could also mean that you have a medical problem. You should get immediate medical care for shortness of breath. CAUSES   Not enough oxygen in the air such as with high altitudes or a smoke-filled room.  Certain lung diseases, infections, or problems.  Heart disease or conditions, such as angina or heart failure.  Low red blood cells (anemia).  Poor physical fitness, which can cause shortness of breath when you exercise.  Chest or back injuries or stiffness.  Being overweight.  Smoking.  Anxiety, which can make you feel like you are not getting enough air. DIAGNOSIS  Serious medical problems can often be found during your physical exam. Tests may also be done to determine why you are having shortness of breath. Tests may include:  Chest X-rays.  Lung function tests.  Blood tests.  An electrocardiogram (ECG).  An ambulatory electrocardiogram. An ambulatory ECG records your heartbeat patterns over a 24-hour period.  Exercise testing.  A transthoracic echocardiogram (TTE). During echocardiography, sound waves are used to evaluate how blood flows through your heart.  A transesophageal echocardiogram (TEE).  Imaging scans. Your health care provider may not be able to find a cause for your shortness of breath after your exam. In this case, it is important to have a follow-up exam with your health care provider as directed.  TREATMENT  Treatment for shortness of breath depends on the cause of your symptoms and can vary greatly. HOME CARE INSTRUCTIONS   Do not smoke. Smoking is a common cause of shortness of breath. If you smoke, ask for help to quit.  Avoid being around chemicals or things that may bother your breathing, such as paint fumes and dust.  Rest as needed. Slowly resume your usual activities.  If medicines were prescribed, take them as directed for the full length of time directed. This  includes oxygen and any inhaled medicines.  Keep all follow-up appointments as directed by your health care provider. SEEK MEDICAL CARE IF:   Your condition does not improve in the time expected.  You have a hard time doing your normal activities even with rest.  You have any new symptoms. SEEK IMMEDIATE MEDICAL CARE IF:   Your shortness of breath gets worse.  You feel light-headed, faint, or develop a cough not controlled with medicines.  You start coughing up blood.  You have pain with breathing.  You have chest pain or pain in your arms, shoulders, or abdomen.  You have a fever.  You are unable to walk up stairs or exercise the way you normally do. MAKE SURE YOU:  Understand these instructions.  Will watch your condition.  Will get help right away if you are not doing well or get worse.   This information is not intended to replace advice given to you by your health care provider. Make sure you discuss any questions you have with your health care provider.   Document Released: 08/17/2001 Document Revised: 11/27/2013 Document Reviewed: 02/07/2012 Elsevier Interactive Patient Education [employer].  Please return immediately if condition worsens. Please contact her primary physician or the physician you were given for referral. If you have any specialist physicians involved in her treatment and plan please also contact them. Thank you for using [facility]. Please return for especially persistent chest pain, high fever or productive cough, increased persistent shortness of breath or any other new concerns

## 2016-02-05 NOTE — ED Provider Notes (Signed)
Time Seen: Approximately *0005  I have reviewed the triage notes  Chief Complaint: Shortness of Breath   History of Present Illness: [name] is a 70 y.o. male who presents with increased shortness of breath this evening. Patient has history of multiple medical problems including prostate cancer, COPD, cardiogenic pulmonary edema, etc. Patient states he was recently discharged from the hospital and was seen and evaluated the [place] earlier today. He states her was no adjustments on his medication. He was at home tonight and approximately 2330 started feeling short of breath he took his inhaler 2 and states he noticed some relief seemed to improve. He had some very transient chest discomfort lasting less than 5 minutes without radiation to the arm, jaw or back region. Patient states he seemed like it was more respiratory in nature. He states his shortness of breath and seemed to get worse again and he arrives by private vehicle he used his inhaler one more time prior to my evaluation states now he is feeling improved he is currently 99% on his typical 3-5 L nasal cannula at home. He denies any fever, chills, productive cough and has a chronic dry nonproductive cough is being treated with cough medication. Patient had a non-ST elevation myocardial infarction and had a heart catheterization which did not show any stent of the lesions on his last visit. Past Medical History  Diagnosis Date  . Anemia   . Cancer ([place]) 12/2013    prostate  . Shortness of breath dyspnea   . COPD (chronic obstructive pulmonary disease) ([place])   . Cardiogenic pulmonary edema ([place]) 12/19/2014  . Diabetes mellitus without complication ([place])   . Hypertension   . Hypercholesteremia   . CHF (congestive heart failure) ([place])   . Cardiomyopathy, ischemic   . GERD (gastroesophageal reflux disease)   . Myocardial infarction [facility]) [phone]    Patient Active Problem List   Diagnosis Date Noted  . CHF (congestive  heart failure) ([place]) 01/31/2016  . Chronic systolic HF (heart failure) ([place]) 12/12/2015  . Chronic systolic heart failure ([place]) 12/12/2015    Past Surgical History  Procedure Laterality Date  . Coronary angioplasty with stent placement    . 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 Tonsillectomy    . Implantable cardioverter defibrillator (icd) generator change Left 12/12/2015    Procedure: DUAL LEAD PLACEMENT CARDIAC DIFIBRILLATOR;  Surgeon: [name], MD;  Location: [facility] ORS;  Service: Cardiovascular;  Laterality: Left;  . Cardiac catheterization N/A 02/02/2016    Procedure: Left Heart Cath and Coronary Angiography;  Surgeon: [name], MD;  Location: [place] [facility];  Service: Cardiovascular;  Laterality: N/A;    Past Surgical History  Procedure Laterality Date  . Coronary angioplasty with stent placement    . 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 Tonsillectomy    . Implantable cardioverter defibrillator (icd) generator change Left 12/12/2015    Procedure: DUAL LEAD PLACEMENT CARDIAC DIFIBRILLATOR;  Surgeon: [name], MD;  Location: [facility] ORS;  Service: Cardiovascular;  Laterality: Left;  . Cardiac catheterization N/A 02/02/2016    Procedure: Left Heart Cath and Coronary Angiography;  Surgeon: [name], MD;  Location: [place] [facility];  Service: Cardiovascular;  Laterality: N/A;    Current Outpatient Rx  Name  Route  Sig  Dispense  Refill  . acetaminophen (TYLENOL) 325 MG tablet   Oral   Take 2 tablets (650 mg total) by mouth every 6 (six) hours as  needed for mild pain (or Fever >/= 101).         Marland Kitchen albuterol (PROVENTIL HFA;VENTOLIN HFA) 108 (90 Base) MCG/ACT inhaler   Inhalation   Inhale 2 puffs into the lungs 3 (three) times daily as needed for wheezing or shortness of breath.         Marland Kitchen aspirin 81 MG tablet   Oral   Take 81 mg by mouth every morning.         Marland Kitchen atorvastatin (LIPITOR) 40 MG tablet   Oral   Take 40 mg by mouth  every evening.         . budesonide-formoterol (SYMBICORT) 80-4.5 MCG/ACT inhaler   Inhalation   Inhale 2 puffs into the lungs 2 (two) times daily.         . clopidogrel (PLAVIX) 75 MG tablet   Oral   Take 75 mg by mouth every morning.         Marland Kitchen dexlansoprazole (DEXILANT) 60 MG capsule   Oral   Take 60 mg by mouth daily.         . ferrous sulfate 325 (65 FE) MG tablet   Oral   Take 325 mg by mouth 2 (two) times daily with a meal.         . finasteride (PROSCAR) 5 MG tablet   Oral   Take 5 mg by mouth at bedtime.         Marland Kitchen FLUoxetine (PROZAC) 20 MG tablet   Oral   Take 20 mg by mouth every morning.         . furosemide (LASIX) 40 MG tablet   Oral   Take 40 mg by mouth every morning.         . gabapentin (NEURONTIN) 300 MG capsule   Oral   Take 600 mg by mouth 3 (three) times daily.         Marland Kitchen guaiFENesin-dextromethorphan (ROBITUSSIN DM) 100-10 MG/5ML syrup   Oral   Take 5 mLs by mouth every 4 (four) hours as needed for cough.   118 mL   0   . insulin glargine (LANTUS) 100 UNIT/ML injection   Subcutaneous   Inject 0.75 mLs (75 Units total) into the skin at bedtime.   10 mL   11   . insulin lispro (HUMALOG) 100 UNIT/ML injection   Subcutaneous   Inject into the skin 3 (three) times daily before meals. Sliding scale based on blood sugar results, 8-14 units.         . isosorbide mononitrate (IMDUR) 60 MG 24 hr tablet   Oral   Take 60 mg by mouth 2 (two) times daily.         Marland Kitchen lisinopril (PRINIVIL,ZESTRIL) 30 MG tablet   Oral   Take 30 mg by mouth every morning.         . loratadine (CLARITIN) 10 MG tablet   Oral   Take 10 mg by mouth daily.         . metFORMIN (GLUCOPHAGE) 1000 MG tablet   Oral   Take 1,000 mg by mouth 2 (two) times daily with a meal.          . metoprolol (LOPRESSOR) 50 MG tablet   Oral   Take 50 mg by mouth 2 (two) times daily.         . Multiple Vitamins-Minerals (CENTRUM SILVER PO)   Oral   Take 1  tablet by mouth every morning.         Marland Kitchen  nitroGLYCERIN (NITROSTAT) 0.4 MG SL tablet   Sublingual   Place 0.4 mg under the tongue every 5 (five) minutes as needed for chest pain.         . ranolazine (RANEXA) 500 MG 12 hr tablet   Oral   Take 500 mg by mouth 2 (two) times daily.         Marland Kitchen spironolactone (ALDACTONE) 25 MG tablet   Oral   Take 25 mg by mouth every morning.         . tamsulosin (FLOMAX) 0.4 MG CAPS capsule   Oral   Take 0.4 mg by mouth daily after supper.           Allergies:  Benadryl; Doxycycline; and Lopid  Family History: Family History  Problem Relation Age of Onset  . CAD Mother   . Alzheimer's disease Father     Social History: Social History  Substance Use Topics  . Smoking status: Former Smoker    Quit date: 12/03/1996  . Smokeless tobacco: None  . 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 --      Excl. in [place]? --     General: Awake ,  Alert , and Oriented times 3; GCS 15 patient speaks in almost complete sentences with no audible stridor or wheezing at the bedside Head: Normal cephalic , atraumatic Eyes: Pupils equal , round, reactive to light Nose/Throat: No nasal drainage, patent upper airway without erythema or exudate.  Neck: Supple, Full range of motion, No anterior adenopathy or palpable thyroid masses Lungs: Bilateral wheezing auscultated at the apices without any weight rhonchi or rales Heart: Regular rate, regular rhythm without murmurs , gallops , or rubs Abdomen: Soft, non tender without rebound, guarding , or rigidity; bowel sounds positive and symmetric in all 4 quadrants. No organomegaly .        Extremities: 2 plus symmetric pulses. No edema, clubbing or cyanosis negative Homans sign with no calf tenderness Neurologic: normal ambulation, Motor symmetric without deficits, sensory intact Skin: warm, dry, no rashes   Labs:   All laboratory work was reviewed including any pertinent negatives or positives listed below:  Labs Reviewed  CBC WITH DIFFERENTIAL/PLATELET  COMPREHENSIVE METABOLIC PANEL  TROPONIN I  BRAIN  NATRIURETIC PEPTIDE   reviewed the patient's laboratory work shows a decreasing troponin from his previous levels. He also has an elevated BNP  EKG:  #1 ED ECG REPORT I, Daymon Larsen, the attending physician, personally viewed and interpreted this ECG.  Date: 02/04/2016 EKG Time: 2342 Rate: *76 Rhythm: normal sinus rhythm with frequent unifocal PVCs QRS Axis: normal Intervals: Left bundle branch block pattern ST/T Wave abnormalities: normal Conduction Disturbances: none Narrative Interpretation: unremarkable No acute ischemic changes  EKG #2 ED ECG REPORT I, Daymon Larsen, the attending physician, personally viewed and interpreted this ECG.  Date: 02/05/2016 EKG Time: 0013 Rate: 69 Rhythm: normal sinus rhythm QRS Axis: normal Intervals: normal ST/T Wave abnormalities:  normal Conduction Disturbances: Left bundle branch block Narrative Interpretation: unremarkable No ischemic changes are noted No significant change from previous EKG other than resolution of PVCs   Radiology:     EXAM: CHEST 2 VIEW  COMPARISON: Radiograph dated 01/31/2016  FINDINGS: Two views of the chest demonstrate stable cardiomegaly. There is diffuse interstitial prominence with Kerley B-lines in compatible with pulmonary edema. Superimposed mild congestive changes is not excluded. There is no focal consolidation, pleural effusion, or pneumothorax. Left pectoral AICD device. Median sternotomy wires. No acute osseous pathology.  IMPRESSION: Stable cardiomegaly with findings of pulmonary edema. No focal consolidation.   Electronically Signed By: Anner Crete M.D. On: 02/05/2016 00:27 I personally reviewed the radiologic studies    ED Course:  Patient's stay here was uneventful he was given a DuoNeb had been had symptomatic relief. I felt he may have a slight increase in his pulmonary edema. He was advised to take an extra fluid pill tonight and to continue with his follow-up with his primary physician. His pulse oximetry is stable and his wheezing improved after DuoNeb and repeat evaluation. His troponin is decreasing and he has history of a recent non-STEMI myocardial infarction with heart catheterization. There was a steady decrease of his troponin from 2.5 on 01/31/16   Assessment:  Acute dyspnea Mild pulmonary edema with bronchospasm      Plan: * Outpatient management Patient was advised to return immediately if condition worsens. Patient was advised to follow up with their primary care physician or other specialized physicians involved in their outpatient care Patient has scheduled follow-up with one of his primary physicians in the morning. He again was advised take an extra fluid pill tonight.            Daymon Larsen, MD 02/05/16  [phone]

## 2016-02-09 DIAGNOSIS — I5023 Acute on chronic systolic (congestive) heart failure: Secondary | ICD-10-CM | POA: Diagnosis not present

## 2016-02-12 DIAGNOSIS — I5023 Acute on chronic systolic (congestive) heart failure: Secondary | ICD-10-CM | POA: Diagnosis not present

## 2016-02-13 ENCOUNTER — Encounter: Payer: Self-pay | Admitting: Family

## 2016-02-13 ENCOUNTER — Ambulatory Visit: Payer: PPO | Attending: Family | Admitting: Family

## 2016-02-13 VITALS — BP 106/86 | HR 72 | Resp 18 | Ht 68.0 in | Wt 248.0 lb

## 2016-02-13 DIAGNOSIS — K219 Gastro-esophageal reflux disease without esophagitis: Secondary | ICD-10-CM | POA: Diagnosis not present

## 2016-02-13 DIAGNOSIS — Z79899 Other long term (current) drug therapy: Secondary | ICD-10-CM | POA: Diagnosis not present

## 2016-02-13 DIAGNOSIS — Z9889 Other specified postprocedural states: Secondary | ICD-10-CM | POA: Insufficient documentation

## 2016-02-13 DIAGNOSIS — E119 Type 2 diabetes mellitus without complications: Secondary | ICD-10-CM | POA: Insufficient documentation

## 2016-02-13 DIAGNOSIS — J4489 Other specified chronic obstructive pulmonary disease: Secondary | ICD-10-CM

## 2016-02-13 DIAGNOSIS — I10 Essential (primary) hypertension: Secondary | ICD-10-CM

## 2016-02-13 DIAGNOSIS — I252 Old myocardial infarction: Secondary | ICD-10-CM | POA: Diagnosis not present

## 2016-02-13 DIAGNOSIS — Z7982 Long term (current) use of aspirin: Secondary | ICD-10-CM | POA: Diagnosis not present

## 2016-02-13 DIAGNOSIS — Z794 Long term (current) use of insulin: Secondary | ICD-10-CM

## 2016-02-13 DIAGNOSIS — E7800 Pure hypercholesterolemia, unspecified: Secondary | ICD-10-CM | POA: Diagnosis not present

## 2016-02-13 DIAGNOSIS — J449 Chronic obstructive pulmonary disease, unspecified: Secondary | ICD-10-CM | POA: Diagnosis not present

## 2016-02-13 DIAGNOSIS — Z888 Allergy status to other drugs, medicaments and biological substances status: Secondary | ICD-10-CM | POA: Diagnosis not present

## 2016-02-13 DIAGNOSIS — I255 Ischemic cardiomyopathy: Secondary | ICD-10-CM | POA: Insufficient documentation

## 2016-02-13 DIAGNOSIS — I2510 Atherosclerotic heart disease of native coronary artery without angina pectoris: Secondary | ICD-10-CM | POA: Diagnosis not present

## 2016-02-13 DIAGNOSIS — I5022 Chronic systolic (congestive) heart failure: Secondary | ICD-10-CM

## 2016-02-13 DIAGNOSIS — G4733 Obstructive sleep apnea (adult) (pediatric): Secondary | ICD-10-CM | POA: Diagnosis not present

## 2016-02-13 DIAGNOSIS — Z87891 Personal history of nicotine dependence: Secondary | ICD-10-CM | POA: Insufficient documentation

## 2016-02-13 DIAGNOSIS — E1143 Type 2 diabetes mellitus with diabetic autonomic (poly)neuropathy: Secondary | ICD-10-CM

## 2016-02-13 NOTE — Patient Instructions (Signed)
Continue weighing daily and call for an overnight weight gain of > 2 pounds or a weekly weight gain of >5 pounds. 

## 2016-02-13 NOTE — Progress Notes (Signed)
Subjective:    Patient ID: [name], male    DOB: [birth year], 70 y.o.   MRN: [id number]  Congestive Heart Failure Presents for initial visit. The disease course has been improving. Associated symptoms include fatigue and shortness of breath. Pertinent negatives include no abdominal pain, chest pain, edema, orthopnea or palpitations. The symptoms have been improving. Past treatments include ACE inhibitors, beta blockers, salt and fluid restriction and aldosterone receptor blockers. The treatment provided moderate relief. Compliance with prior treatments has been good. His past medical history is significant for anemia, CAD, chronic lung disease, DM and HTN. There is no history of CVA. He has one 1st degree relative with heart disease. Compliance with exercise is 51-75%.  Hypertension This is a chronic problem. The current episode started more than 1 year ago. The problem is unchanged. The problem is controlled. Associated symptoms include shortness of breath. Pertinent negatives include no chest pain, headaches, neck pain, palpitations or peripheral edema. There are no associated agents to hypertension. Risk factors for coronary artery disease include diabetes mellitus, dyslipidemia, family history and male gender. Past treatments include ACE inhibitors, beta blockers, diuretics and lifestyle changes. The current treatment provides significant improvement. There are no compliance problems.  Hypertensive end-organ damage includes CAD/MI and heart failure.    Past Medical History  Diagnosis Date  . Anemia   . Cancer ([place]) 12/2013    prostate  . Shortness of breath dyspnea   . COPD (chronic obstructive pulmonary disease) ([place])   . Cardiogenic pulmonary edema ([place]) 12/19/2014  . Diabetes mellitus without complication ([place])   . Hypertension   . Hypercholesteremia   . CHF (congestive heart failure) ([place])   . Cardiomyopathy, ischemic   . GERD (gastroesophageal reflux disease)   [id number]    Past Surgical History  Procedure Laterality Date  . Coronary angioplasty with stent placement    . 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 Tonsillectomy    . Implantable cardioverter defibrillator (icd) generator change Left 12/12/2015    Procedure: DUAL LEAD PLACEMENT CARDIAC DIFIBRILLATOR;  Surgeon: [name], MD;  Location: [facility] ORS;  Service: Cardiovascular;  Laterality: Left;  . Cardiac catheterization N/A 02/02/2016    Procedure: Left Heart Cath and Coronary Angiography;  Surgeon: [name], MD;  Location: [place] [facility];  Service: Cardiovascular;  Laterality: N/A;    Family History  Problem Relation Age of Onset  . CAD Mother   . Alzheimer's disease Father     Social History  Substance Use Topics  . Smoking status: Former Smoker    Quit date: 12/03/1996  . Smokeless tobacco: Never Used  . Alcohol Use: No    Allergies  Allergen Reactions  . Benadryl [Diphenhydramine] Other (See Comments)    " Hyperactivity"  . Doxycycline Swelling    Pt went into pulmonary edema.  . Lopid [Gemfibrozil] Swelling    "I gain 1 pound a day for 30 days."    Prior to Admission medications   Medication Sig Start Date End Date Taking? Authorizing Provider  acetaminophen (TYLENOL) 325 MG tablet Take 2 tablets (650 mg total) by mouth every 6 (six) hours as needed for mild pain (or Fever >/= 101). 02/02/16  Yes Nicholes Mango, MD  albuterol (PROVENTIL HFA;VENTOLIN HFA) 108 (90 Base) MCG/ACT inhaler Inhale 2 puffs into the lungs 3 (three) times daily as needed for wheezing or shortness of breath.   Yes Historical Provider, MD  albuterol-ipratropium (COMBIVENT) 18-103 MCG/ACT  inhaler Inhale 1 puff into the lungs 2 times daily at 12 noon and 4 pm.   Yes Historical Provider, MD  aspirin 81 MG tablet Take 81 mg by mouth every morning.   Yes Historical Provider, MD  atorvastatin (LIPITOR) 40 MG tablet Take 80 mg by mouth every evening.    Yes  Historical Provider, MD  budesonide-formoterol (SYMBICORT) 80-4.5 MCG/ACT inhaler Inhale 2 puffs into the lungs 2 (two) times daily.   Yes Historical Provider, MD  clopidogrel (PLAVIX) 75 MG tablet Take 75 mg by mouth every morning.   Yes Historical Provider, MD  dexlansoprazole (DEXILANT) 60 MG capsule Take 60 mg by mouth daily.   Yes Historical Provider, MD  ferrous sulfate 325 (65 FE) MG tablet Take 325 mg by mouth 2 (two) times daily with a meal.   Yes Historical Provider, MD  finasteride (PROSCAR) 5 MG tablet Take 5 mg by mouth at bedtime.   Yes Historical Provider, MD  FLUoxetine (PROZAC) 20 MG tablet Take 20 mg by mouth every morning.   Yes Historical Provider, MD  furosemide (LASIX) 40 MG tablet Take 40 mg by mouth every morning.   Yes Historical Provider, MD  gabapentin (NEURONTIN) 300 MG capsule Take 600 mg by mouth 3 (three) times daily.   Yes Historical Provider, MD  insulin glargine (LANTUS) 100 UNIT/ML injection Inject 0.75 mLs (75 Units total) into the skin at bedtime. Patient taking differently: Inject 60 Units into the skin at bedtime.  02/02/16  Yes Nicholes Mango, MD  lisinopril (PRINIVIL,ZESTRIL) 30 MG tablet Take 20 mg by mouth every morning.    Yes Historical Provider, MD  loratadine (CLARITIN) 10 MG tablet Take 10 mg by mouth daily.   Yes Historical Provider, MD  metFORMIN (GLUCOPHAGE) 1000 MG tablet Take 1,000 mg by mouth 2 (two) times daily with a meal.    Yes Historical Provider, MD  metoprolol succinate (TOPROL-XL) 50 MG 24 hr tablet Take 50 mg by mouth daily. Take with or immediately following a meal.   Yes Historical Provider, MD  Multiple Vitamins-Minerals (CENTRUM SILVER PO) Take 1 tablet by mouth every morning.   Yes Historical Provider, MD  nitroGLYCERIN (NITROSTAT) 0.4 MG SL tablet Place 0.4 mg under the tongue every 5 (five) minutes as needed for chest pain.   Yes Historical Provider, MD  ranolazine (RANEXA) 500 MG 12 hr tablet Take 500 mg by mouth 2 (two) times daily.    Yes Historical Provider, MD  spironolactone (ALDACTONE) 25 MG tablet Take 25 mg by mouth every morning.   Yes Historical Provider, MD  tamsulosin (FLOMAX) 0.4 MG CAPS capsule Take 0.4 mg by mouth daily after supper.   Yes Historical Provider, MD     Review of Systems  Constitutional: Positive for fatigue. Negative for appetite change.  HENT: Negative for congestion, postnasal drip and sore throat.   Eyes: Negative.   Respiratory: Positive for cough (sometimes) and shortness of breath. Negative for chest tightness and wheezing.   Cardiovascular: Negative for chest pain, palpitations and leg swelling.  Gastrointestinal: Negative for abdominal pain and abdominal distention.  Endocrine: Negative.   Genitourinary: Negative.   Musculoskeletal: Negative for back pain and neck pain.  Skin: Negative.   Allergic/Immunologic: Negative.   Neurological: Positive for light-headedness (on occasion) and numbness (due to neuropathy from diabetes). Negative for dizziness and headaches.  Hematological: Negative for adenopathy. Bruises/bleeds easily.  Psychiatric/Behavioral: Positive for dysphoric mood. Negative for sleep disturbance (oxygen and CPAP at bedtime. ). The patient is not  nervous/anxious.        Objective:   Physical Exam  Constitutional: He is oriented to person, place, and time. He appears well-developed and well-nourished.  HENT:  Head: Normocephalic and atraumatic.  Eyes: Conjunctivae are normal. Pupils are equal, round, and reactive to light.  Neck: Normal range of motion. Neck supple.  Cardiovascular: Normal rate and regular rhythm.   Pulmonary/Chest: Effort normal. He has no wheezes. He has no rales.  Abdominal: Soft. He exhibits no distension. There is no tenderness.  Musculoskeletal: He exhibits no edema or tenderness.  Neurological: He is alert and oriented to person, place, and time.  Skin: Skin is warm and dry.  Psychiatric: He has a normal mood and affect. His behavior is  normal. Thought content normal.  Nursing note and vitals reviewed.   BP 106/86 mmHg  Pulse 72  Resp 18  Ht 5\' 8"  (1.727 m)  Wt 248 lb (112.492 kg)  BMI 37.72 kg/m2  SpO2 100%       Assessment & Plan:  1: Chronic heart failure with reduced ejection fraction- Patient presents with fatigue and shortness of breath upon exertion. He says that he was a little tired upon walking into the office and once he sat down for a few minutes, his symptoms improved. He is already weighing himself on a daily basis and says that his weight has been stable. Instructed to call for an overnight weight gain of >2 pounds or a weekly weight gain of >5 pounds. He does not add salt to his food and his wife is already reading food labels. A low sodium cookbook was given to them along with written information about following a 2000mg  sodium diet. Discussed changing his lisinopril to entresto and a brochure was given to them about that. May switch at his next office visit. Could also work on titrating up his metoprolol as well.  2: HTN- Blood pressure looks good today. Continue medications at this time. 3: COPD- Continues to use his inhalers. No wheezing heard today. Can wear his oxygen during the day if needed.  4: Obstructive sleep apnea- Wears his CPAP on a nightly basis along with oxygen at 5L. Feels like he's sleeping well.  5: Diabetes- He says that his glucose this morning was 120. Continues to take lantus & glucophage.  Return here in 1 month or sooner for any questions/problems before then.

## 2016-02-18 DIAGNOSIS — I5023 Acute on chronic systolic (congestive) heart failure: Secondary | ICD-10-CM | POA: Diagnosis not present

## 2016-02-18 DIAGNOSIS — D638 Anemia in other chronic diseases classified elsewhere: Secondary | ICD-10-CM | POA: Diagnosis not present

## 2016-02-18 DIAGNOSIS — I10 Essential (primary) hypertension: Secondary | ICD-10-CM | POA: Diagnosis not present

## 2016-02-18 DIAGNOSIS — I25810 Atherosclerosis of coronary artery bypass graft(s) without angina pectoris: Secondary | ICD-10-CM | POA: Diagnosis not present

## 2016-02-18 DIAGNOSIS — M79661 Pain in right lower leg: Secondary | ICD-10-CM | POA: Diagnosis not present

## 2016-02-18 DIAGNOSIS — M79662 Pain in left lower leg: Secondary | ICD-10-CM | POA: Diagnosis not present

## 2016-02-18 DIAGNOSIS — R0989 Other specified symptoms and signs involving the circulatory and respiratory systems: Secondary | ICD-10-CM | POA: Diagnosis not present

## 2016-02-18 DIAGNOSIS — E7800 Pure hypercholesterolemia, unspecified: Secondary | ICD-10-CM | POA: Diagnosis not present

## 2016-02-18 DIAGNOSIS — Z95810 Presence of automatic (implantable) cardiac defibrillator: Secondary | ICD-10-CM | POA: Diagnosis not present

## 2016-02-18 DIAGNOSIS — G4733 Obstructive sleep apnea (adult) (pediatric): Secondary | ICD-10-CM | POA: Diagnosis not present

## 2016-02-18 DIAGNOSIS — N179 Acute kidney failure, unspecified: Secondary | ICD-10-CM | POA: Diagnosis not present

## 2016-02-23 DIAGNOSIS — L700 Acne vulgaris: Secondary | ICD-10-CM | POA: Diagnosis not present

## 2016-02-23 DIAGNOSIS — L570 Actinic keratosis: Secondary | ICD-10-CM | POA: Diagnosis not present

## 2016-02-23 DIAGNOSIS — L578 Other skin changes due to chronic exposure to nonionizing radiation: Secondary | ICD-10-CM | POA: Diagnosis not present

## 2016-02-23 DIAGNOSIS — I509 Heart failure, unspecified: Secondary | ICD-10-CM | POA: Diagnosis not present

## 2016-02-23 DIAGNOSIS — I789 Disease of capillaries, unspecified: Secondary | ICD-10-CM | POA: Diagnosis not present

## 2016-03-01 DIAGNOSIS — C61 Malignant neoplasm of prostate: Secondary | ICD-10-CM | POA: Diagnosis not present

## 2016-03-01 DIAGNOSIS — I5022 Chronic systolic (congestive) heart failure: Secondary | ICD-10-CM | POA: Diagnosis not present

## 2016-03-04 DIAGNOSIS — K219 Gastro-esophageal reflux disease without esophagitis: Secondary | ICD-10-CM | POA: Diagnosis not present

## 2016-03-04 DIAGNOSIS — R079 Chest pain, unspecified: Secondary | ICD-10-CM | POA: Diagnosis not present

## 2016-03-11 DIAGNOSIS — E7800 Pure hypercholesterolemia, unspecified: Secondary | ICD-10-CM | POA: Diagnosis not present

## 2016-03-11 DIAGNOSIS — E875 Hyperkalemia: Secondary | ICD-10-CM | POA: Diagnosis not present

## 2016-03-11 DIAGNOSIS — I5043 Acute on chronic combined systolic (congestive) and diastolic (congestive) heart failure: Secondary | ICD-10-CM | POA: Diagnosis not present

## 2016-03-11 DIAGNOSIS — F329 Major depressive disorder, single episode, unspecified: Secondary | ICD-10-CM | POA: Diagnosis not present

## 2016-03-11 DIAGNOSIS — E1142 Type 2 diabetes mellitus with diabetic polyneuropathy: Secondary | ICD-10-CM | POA: Diagnosis not present

## 2016-03-11 DIAGNOSIS — I25118 Atherosclerotic heart disease of native coronary artery with other forms of angina pectoris: Secondary | ICD-10-CM | POA: Diagnosis not present

## 2016-03-11 DIAGNOSIS — J810 Acute pulmonary edema: Secondary | ICD-10-CM | POA: Diagnosis not present

## 2016-03-11 DIAGNOSIS — Z794 Long term (current) use of insulin: Secondary | ICD-10-CM | POA: Diagnosis not present

## 2016-03-11 DIAGNOSIS — N400 Enlarged prostate without lower urinary tract symptoms: Secondary | ICD-10-CM | POA: Diagnosis not present

## 2016-03-11 DIAGNOSIS — D638 Anemia in other chronic diseases classified elsewhere: Secondary | ICD-10-CM | POA: Diagnosis not present

## 2016-03-11 DIAGNOSIS — J449 Chronic obstructive pulmonary disease, unspecified: Secondary | ICD-10-CM | POA: Diagnosis not present

## 2016-03-11 DIAGNOSIS — K219 Gastro-esophageal reflux disease without esophagitis: Secondary | ICD-10-CM | POA: Diagnosis not present

## 2016-03-11 DIAGNOSIS — Z955 Presence of coronary angioplasty implant and graft: Secondary | ICD-10-CM | POA: Diagnosis not present

## 2016-03-11 DIAGNOSIS — Z7984 Long term (current) use of oral hypoglycemic drugs: Secondary | ICD-10-CM | POA: Diagnosis not present

## 2016-03-11 DIAGNOSIS — J309 Allergic rhinitis, unspecified: Secondary | ICD-10-CM | POA: Diagnosis not present

## 2016-03-11 DIAGNOSIS — E1121 Type 2 diabetes mellitus with diabetic nephropathy: Secondary | ICD-10-CM | POA: Diagnosis not present

## 2016-03-11 DIAGNOSIS — Z95810 Presence of automatic (implantable) cardiac defibrillator: Secondary | ICD-10-CM | POA: Diagnosis not present

## 2016-03-11 DIAGNOSIS — R7989 Other specified abnormal findings of blood chemistry: Secondary | ICD-10-CM | POA: Diagnosis not present

## 2016-03-11 DIAGNOSIS — Z9981 Dependence on supplemental oxygen: Secondary | ICD-10-CM | POA: Diagnosis not present

## 2016-03-11 DIAGNOSIS — Z951 Presence of aortocoronary bypass graft: Secondary | ICD-10-CM | POA: Diagnosis not present

## 2016-03-11 DIAGNOSIS — R0602 Shortness of breath: Secondary | ICD-10-CM | POA: Diagnosis not present

## 2016-03-11 DIAGNOSIS — I255 Ischemic cardiomyopathy: Secondary | ICD-10-CM | POA: Diagnosis not present

## 2016-03-11 DIAGNOSIS — E785 Hyperlipidemia, unspecified: Secondary | ICD-10-CM | POA: Diagnosis not present

## 2016-03-11 DIAGNOSIS — R918 Other nonspecific abnormal finding of lung field: Secondary | ICD-10-CM | POA: Diagnosis not present

## 2016-03-11 DIAGNOSIS — G4733 Obstructive sleep apnea (adult) (pediatric): Secondary | ICD-10-CM | POA: Diagnosis not present

## 2016-03-11 DIAGNOSIS — J90 Pleural effusion, not elsewhere classified: Secondary | ICD-10-CM | POA: Diagnosis not present

## 2016-03-11 DIAGNOSIS — Z950 Presence of cardiac pacemaker: Secondary | ICD-10-CM | POA: Diagnosis not present

## 2016-03-11 DIAGNOSIS — J811 Chronic pulmonary edema: Secondary | ICD-10-CM | POA: Diagnosis not present

## 2016-03-11 DIAGNOSIS — I110 Hypertensive heart disease with heart failure: Secondary | ICD-10-CM | POA: Diagnosis not present

## 2016-03-11 DIAGNOSIS — I252 Old myocardial infarction: Secondary | ICD-10-CM | POA: Diagnosis not present

## 2016-03-12 DIAGNOSIS — I255 Ischemic cardiomyopathy: Secondary | ICD-10-CM | POA: Diagnosis not present

## 2016-03-12 DIAGNOSIS — Z951 Presence of aortocoronary bypass graft: Secondary | ICD-10-CM | POA: Diagnosis not present

## 2016-03-12 DIAGNOSIS — I5022 Chronic systolic (congestive) heart failure: Secondary | ICD-10-CM | POA: Diagnosis not present

## 2016-03-12 DIAGNOSIS — I25728 Atherosclerosis of autologous artery coronary artery bypass graft(s) with other forms of angina pectoris: Secondary | ICD-10-CM | POA: Diagnosis not present

## 2016-03-12 DIAGNOSIS — Z95810 Presence of automatic (implantable) cardiac defibrillator: Secondary | ICD-10-CM | POA: Diagnosis not present

## 2016-03-12 DIAGNOSIS — I5023 Acute on chronic systolic (congestive) heart failure: Secondary | ICD-10-CM | POA: Diagnosis not present

## 2016-03-12 DIAGNOSIS — R0602 Shortness of breath: Secondary | ICD-10-CM | POA: Diagnosis not present

## 2016-03-12 DIAGNOSIS — I509 Heart failure, unspecified: Secondary | ICD-10-CM | POA: Diagnosis not present

## 2016-03-13 DIAGNOSIS — I25728 Atherosclerosis of autologous artery coronary artery bypass graft(s) with other forms of angina pectoris: Secondary | ICD-10-CM | POA: Diagnosis not present

## 2016-03-13 DIAGNOSIS — Z951 Presence of aortocoronary bypass graft: Secondary | ICD-10-CM | POA: Diagnosis not present

## 2016-03-13 DIAGNOSIS — I255 Ischemic cardiomyopathy: Secondary | ICD-10-CM | POA: Diagnosis not present

## 2016-03-13 DIAGNOSIS — I5023 Acute on chronic systolic (congestive) heart failure: Secondary | ICD-10-CM | POA: Diagnosis not present

## 2016-03-13 DIAGNOSIS — Z95810 Presence of automatic (implantable) cardiac defibrillator: Secondary | ICD-10-CM | POA: Diagnosis not present

## 2016-03-14 DIAGNOSIS — I25728 Atherosclerosis of autologous artery coronary artery bypass graft(s) with other forms of angina pectoris: Secondary | ICD-10-CM | POA: Diagnosis not present

## 2016-03-14 DIAGNOSIS — Z951 Presence of aortocoronary bypass graft: Secondary | ICD-10-CM | POA: Diagnosis not present

## 2016-03-14 DIAGNOSIS — I5023 Acute on chronic systolic (congestive) heart failure: Secondary | ICD-10-CM | POA: Diagnosis not present

## 2016-03-14 DIAGNOSIS — Z95810 Presence of automatic (implantable) cardiac defibrillator: Secondary | ICD-10-CM | POA: Diagnosis not present

## 2016-03-14 DIAGNOSIS — I255 Ischemic cardiomyopathy: Secondary | ICD-10-CM | POA: Diagnosis not present

## 2016-03-15 ENCOUNTER — Ambulatory Visit: Payer: PPO | Admitting: Family

## 2016-03-15 DIAGNOSIS — Z951 Presence of aortocoronary bypass graft: Secondary | ICD-10-CM | POA: Diagnosis not present

## 2016-03-15 DIAGNOSIS — E669 Obesity, unspecified: Secondary | ICD-10-CM | POA: Diagnosis not present

## 2016-03-15 DIAGNOSIS — I429 Cardiomyopathy, unspecified: Secondary | ICD-10-CM | POA: Diagnosis not present

## 2016-03-15 DIAGNOSIS — R262 Difficulty in walking, not elsewhere classified: Secondary | ICD-10-CM | POA: Diagnosis not present

## 2016-03-15 DIAGNOSIS — I10 Essential (primary) hypertension: Secondary | ICD-10-CM | POA: Diagnosis not present

## 2016-03-15 DIAGNOSIS — I447 Left bundle-branch block, unspecified: Secondary | ICD-10-CM | POA: Diagnosis not present

## 2016-03-15 DIAGNOSIS — E785 Hyperlipidemia, unspecified: Secondary | ICD-10-CM | POA: Diagnosis not present

## 2016-03-15 DIAGNOSIS — I509 Heart failure, unspecified: Secondary | ICD-10-CM | POA: Diagnosis not present

## 2016-03-16 DIAGNOSIS — R262 Difficulty in walking, not elsewhere classified: Secondary | ICD-10-CM | POA: Diagnosis not present

## 2016-03-18 DIAGNOSIS — D638 Anemia in other chronic diseases classified elsewhere: Secondary | ICD-10-CM | POA: Diagnosis not present

## 2016-03-18 DIAGNOSIS — E7800 Pure hypercholesterolemia, unspecified: Secondary | ICD-10-CM | POA: Diagnosis not present

## 2016-03-22 DIAGNOSIS — I5022 Chronic systolic (congestive) heart failure: Secondary | ICD-10-CM | POA: Diagnosis not present

## 2016-03-24 DIAGNOSIS — E7800 Pure hypercholesterolemia, unspecified: Secondary | ICD-10-CM | POA: Diagnosis not present

## 2016-03-24 DIAGNOSIS — N179 Acute kidney failure, unspecified: Secondary | ICD-10-CM | POA: Diagnosis not present

## 2016-03-24 DIAGNOSIS — I10 Essential (primary) hypertension: Secondary | ICD-10-CM | POA: Diagnosis not present

## 2016-03-24 DIAGNOSIS — I5022 Chronic systolic (congestive) heart failure: Secondary | ICD-10-CM | POA: Diagnosis not present

## 2016-03-24 DIAGNOSIS — E1140 Type 2 diabetes mellitus with diabetic neuropathy, unspecified: Secondary | ICD-10-CM | POA: Diagnosis not present

## 2016-03-24 DIAGNOSIS — D638 Anemia in other chronic diseases classified elsewhere: Secondary | ICD-10-CM | POA: Diagnosis not present

## 2016-04-01 DIAGNOSIS — Z794 Long term (current) use of insulin: Secondary | ICD-10-CM | POA: Diagnosis not present

## 2016-04-01 DIAGNOSIS — E1142 Type 2 diabetes mellitus with diabetic polyneuropathy: Secondary | ICD-10-CM | POA: Diagnosis not present

## 2016-04-01 DIAGNOSIS — I2510 Atherosclerotic heart disease of native coronary artery without angina pectoris: Secondary | ICD-10-CM | POA: Diagnosis not present

## 2016-04-05 ENCOUNTER — Encounter: Payer: Self-pay | Admitting: Family

## 2016-04-05 ENCOUNTER — Ambulatory Visit: Payer: PPO | Attending: Family | Admitting: Family

## 2016-04-05 VITALS — BP 106/43 | HR 59 | Resp 20 | Ht 68.0 in | Wt 247.0 lb

## 2016-04-05 DIAGNOSIS — I110 Hypertensive heart disease with heart failure: Secondary | ICD-10-CM | POA: Insufficient documentation

## 2016-04-05 DIAGNOSIS — Z8249 Family history of ischemic heart disease and other diseases of the circulatory system: Secondary | ICD-10-CM | POA: Diagnosis not present

## 2016-04-05 DIAGNOSIS — Z9889 Other specified postprocedural states: Secondary | ICD-10-CM | POA: Diagnosis not present

## 2016-04-05 DIAGNOSIS — I252 Old myocardial infarction: Secondary | ICD-10-CM | POA: Diagnosis not present

## 2016-04-05 DIAGNOSIS — Z794 Long term (current) use of insulin: Secondary | ICD-10-CM | POA: Diagnosis not present

## 2016-04-05 DIAGNOSIS — I2510 Atherosclerotic heart disease of native coronary artery without angina pectoris: Secondary | ICD-10-CM | POA: Diagnosis not present

## 2016-04-05 DIAGNOSIS — K219 Gastro-esophageal reflux disease without esophagitis: Secondary | ICD-10-CM | POA: Diagnosis not present

## 2016-04-05 DIAGNOSIS — J45909 Unspecified asthma, uncomplicated: Secondary | ICD-10-CM | POA: Diagnosis not present

## 2016-04-05 DIAGNOSIS — R42 Dizziness and giddiness: Secondary | ICD-10-CM | POA: Insufficient documentation

## 2016-04-05 DIAGNOSIS — I10 Essential (primary) hypertension: Secondary | ICD-10-CM

## 2016-04-05 DIAGNOSIS — I255 Ischemic cardiomyopathy: Secondary | ICD-10-CM | POA: Insufficient documentation

## 2016-04-05 DIAGNOSIS — Z79899 Other long term (current) drug therapy: Secondary | ICD-10-CM | POA: Insufficient documentation

## 2016-04-05 DIAGNOSIS — I5022 Chronic systolic (congestive) heart failure: Secondary | ICD-10-CM | POA: Diagnosis not present

## 2016-04-05 DIAGNOSIS — D649 Anemia, unspecified: Secondary | ICD-10-CM | POA: Diagnosis not present

## 2016-04-05 DIAGNOSIS — Z7982 Long term (current) use of aspirin: Secondary | ICD-10-CM | POA: Diagnosis not present

## 2016-04-05 DIAGNOSIS — G4733 Obstructive sleep apnea (adult) (pediatric): Secondary | ICD-10-CM | POA: Diagnosis not present

## 2016-04-05 DIAGNOSIS — Z888 Allergy status to other drugs, medicaments and biological substances status: Secondary | ICD-10-CM | POA: Diagnosis not present

## 2016-04-05 DIAGNOSIS — Z87891 Personal history of nicotine dependence: Secondary | ICD-10-CM | POA: Diagnosis not present

## 2016-04-05 DIAGNOSIS — E7800 Pure hypercholesterolemia, unspecified: Secondary | ICD-10-CM | POA: Insufficient documentation

## 2016-04-05 DIAGNOSIS — E119 Type 2 diabetes mellitus without complications: Secondary | ICD-10-CM | POA: Insufficient documentation

## 2016-04-05 DIAGNOSIS — J449 Chronic obstructive pulmonary disease, unspecified: Secondary | ICD-10-CM | POA: Diagnosis not present

## 2016-04-05 DIAGNOSIS — E1143 Type 2 diabetes mellitus with diabetic autonomic (poly)neuropathy: Secondary | ICD-10-CM

## 2016-04-05 NOTE — Patient Instructions (Addendum)
Continue weighing daily and call for an overnight weight gain of > 2 pounds or a weekly weight gain of >5 pounds.  Check with insurance company regarding entresto use.   Double check metoprolol dose at home.

## 2016-04-05 NOTE — Progress Notes (Signed)
Subjective:    Patient ID: [name], male    DOB: 28-Jan-1946, 70 y.o.   MRN: [id number]  Congestive Heart Failure Presents for follow-up visit. The disease course has been stable. Associated symptoms include fatigue and shortness of breath. Pertinent negatives include no abdominal pain, chest pain, chest pressure, edema, orthopnea or palpitations. The symptoms have been stable. Past treatments include ACE inhibitors, beta blockers, oxygen and salt and fluid restriction. The treatment provided moderate relief. Compliance with prior treatments has been good. His past medical history is significant for anemia, CAD, chronic lung disease, DM and HTN. He has one 1st degree relative with heart disease.  Hypertension This is a chronic problem. The current episode started more than 1 year ago. Associated symptoms include shortness of breath. Pertinent negatives include no chest pain, headaches, neck pain, palpitations or peripheral edema. There are no associated agents to hypertension. Risk factors for coronary artery disease include diabetes mellitus, dyslipidemia, family history, obesity, male gender and sedentary lifestyle. Past treatments include ACE inhibitors, beta blockers, diuretics and lifestyle changes. The current treatment provides moderate improvement. Compliance problems include exercise.  Hypertensive end-organ damage includes CAD/MI and heart failure.   Past Medical History  Diagnosis Date  . Anemia   . Cancer ([place]) 12/2013    prostate  . Shortness of breath dyspnea   . COPD (chronic obstructive pulmonary disease) ([place])   . 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 Diabetes mellitus without complication ([place])   . Hypertension   . Hypercholesteremia   . CHF (congestive heart failure) ([place])   . Cardiomyopathy, ischemic   . GERD (gastroesophageal reflux disease)   . Myocardial infarction [id number]    Past Surgical History  Procedure Laterality Date  . Coronary  angioplasty with stent placement    . 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 Tonsillectomy    . Implantable cardioverter defibrillator (icd) generator change Left 12/12/2015    Procedure: DUAL LEAD PLACEMENT CARDIAC DIFIBRILLATOR;  Surgeon: [name], MD;  Location: [facility];  Service: Cardiovascular;  Laterality: Left;  . Cardiac catheterization N/A 02/02/2016    Procedure: Left Heart Cath and Coronary Angiography;  Surgeon: [name], MD;  Location: [facility];  Service: Cardiovascular;  Laterality: N/A;    Family History  Problem Relation Age of Onset  . CAD Mother   . Alzheimer's disease Father     Social History  Substance Use Topics  . Smoking status: Former Smoker    Quit date: 12/03/1996  . Smokeless tobacco: Never Used  . Alcohol Use: No    Allergies  Allergen Reactions  . Benadryl [Diphenhydramine] Other (See Comments)    " Hyperactivity"  . Doxycycline Swelling    Pt went into pulmonary edema.  . Lopid [Gemfibrozil] Swelling    "I gain 1 pound a day for 30 days."    Prior to Admission medications   Medication Sig Start Date End Date Taking? Authorizing Provider  acetaminophen (TYLENOL) 325 MG tablet Take 2 tablets (650 mg total) by mouth every 6 (six) hours as needed for mild pain (or Fever >/= 101). 02/02/16  Yes Nicholes Mango, MD  albuterol (PROVENTIL HFA;VENTOLIN HFA) 108 (90 Base) MCG/ACT inhaler Inhale 2 puffs into the lungs 3 (three) times daily as needed for wheezing or shortness of breath.   Yes Historical Provider, MD  albuterol-ipratropium (COMBIVENT) 18-103 MCG/ACT inhaler Inhale 1 puff into the lungs 2 times daily at 12 noon and 4 pm.  Yes Historical Provider, MD  aspirin 81 MG tablet Take 81 mg by mouth every morning.   Yes Historical Provider, MD  atorvastatin (LIPITOR) 40 MG tablet Take 80 mg by mouth every evening.    Yes Historical Provider, MD  budesonide-formoterol (SYMBICORT) 80-4.5 MCG/ACT inhaler Inhale 2 puffs into the  lungs 2 (two) times daily.   Yes Historical Provider, MD  clopidogrel (PLAVIX) 75 MG tablet Take 75 mg by mouth every morning.   Yes Historical Provider, MD  dexlansoprazole (DEXILANT) 60 MG capsule Take 60 mg by mouth daily.   Yes Historical Provider, MD  ferrous sulfate 325 (65 FE) MG tablet Take 325 mg by mouth 2 (two) times daily with a meal.   Yes Historical Provider, MD  finasteride (PROSCAR) 5 MG tablet Take 5 mg by mouth at bedtime.   Yes Historical Provider, MD  FLUoxetine (PROZAC) 20 MG tablet Take 20 mg by mouth every morning.   Yes Historical Provider, MD  furosemide (LASIX) 40 MG tablet Take 40 mg by mouth every morning.   Yes Historical Provider, MD  gabapentin (NEURONTIN) 300 MG capsule Take 600 mg by mouth 3 (three) times daily.   Yes Historical Provider, MD  insulin glargine (LANTUS) 100 UNIT/ML injection Inject 0.75 mLs (75 Units total) into the skin at bedtime. Patient taking differently: Inject 60 Units into the skin at bedtime.  02/02/16  Yes Nicholes Mango, MD  lisinopril (PRINIVIL,ZESTRIL) 20 MG tablet Take 20 mg by mouth daily.   Yes Historical Provider, MD  loratadine (CLARITIN) 10 MG tablet Take 10 mg by mouth daily.   Yes Historical Provider, MD  metFORMIN (GLUCOPHAGE) 1000 MG tablet Take 1,000 mg by mouth 2 (two) times daily with a meal.    Yes Historical Provider, MD  metoprolol (LOPRESSOR) 50 MG tablet Take 50 mg by mouth 2 (two) times daily.   Yes Historical Provider, MD  Multiple Vitamins-Minerals (CENTRUM SILVER PO) Take 1 tablet by mouth every morning.   Yes Historical Provider, MD  nitroGLYCERIN (NITROSTAT) 0.4 MG SL tablet Place 0.4 mg under the tongue every 5 (five) minutes as needed for chest pain.   Yes Historical Provider, MD  ranolazine (RANEXA) 500 MG 12 hr tablet Take 500 mg by mouth 2 (two) times daily.   Yes Historical Provider, MD  tamsulosin (FLOMAX) 0.4 MG CAPS capsule Take 0.4 mg by mouth daily after supper.   Yes Historical Provider, MD      Review  of Systems  Constitutional: Positive for fatigue. Negative for appetite change.  HENT: Negative for congestion, postnasal drip and sore throat.   Eyes: Negative.   Respiratory: Positive for shortness of breath. Negative for cough, chest tightness and wheezing.   Cardiovascular: Negative for chest pain, palpitations and leg swelling.  Gastrointestinal: Negative for abdominal pain and abdominal distention.  Endocrine: Negative.   Genitourinary: Negative.   Musculoskeletal: Negative for back pain and neck pain.  Skin: Negative.   Allergic/Immunologic: Negative.   Neurological: Positive for weakness, light-headedness (with position changes) and numbness (& tingling in legs due to neuropathy). Negative for dizziness and headaches.  Hematological: Negative for adenopathy. Bruises/bleeds easily.  Psychiatric/Behavioral: Negative for sleep disturbance (wearing CPAP with oxygen @ 5L. sleeping on 2 pillows) and dysphoric mood. The patient is not nervous/anxious.        Objective:   Physical Exam  Constitutional: He is oriented to person, place, and time. He appears well-developed and well-nourished.  HENT:  Head: Normocephalic and atraumatic.  Eyes: Conjunctivae are normal.  Pupils are equal, round, and reactive to light.  Neck: Normal range of motion. Neck supple.  Cardiovascular: Regular rhythm.  Bradycardia present.   Pulmonary/Chest: Effort normal. He has no wheezes. He has no rales.  Abdominal: Soft. He exhibits no distension. There is no tenderness.  Musculoskeletal: He exhibits no edema or tenderness.  Neurological: He is alert and oriented to person, place, and time.  Skin: Skin is warm and dry.  Psychiatric: He has a normal mood and affect. His behavior is normal. Thought content normal.  Nursing note and vitals reviewed.   BP 106/43 mmHg  Pulse 59  Resp 20  Ht 5\' 8"  (1.727 m)  Wt 247 lb (112.038 kg)  BMI 37.56 kg/m2  SpO2 99%       Assessment & Plan:  1: Chronic heart  failure with reduced ejection fraction- Patient presents with fatigue and shortness of breath upon exertion. He says that he was a little short of breath upon walking into the office but once he sat down, his breathing improved and he feels like his breathing is actually doing better. He continues to weigh himself and says that his weight has been stable. By our scale, he's lost 1 pound since he was last here. He is not adding any salt to his food and his wife is diligent about reading food labels. They do like to go out to eat and they were encouraged to try and research the choices before they go. He is trying to limit his fluid intake to 2L daily. He has been to [facility] a couple of times since he was last here due to fluid overload. Discussed changing his lisinopril to entresto and he's going to check with his pharmacy as well as [facility] to see how it's covered. Discussed that it may not be initially approved without a prior authorization. Interested in cardiac rehab so a referral was placed although I did tell patient that they would be contacting his PCP for insurance purposes.  2: HTN- Blood pressure on the low side today and he says that he only gets dizzy if he stands up too quickly. Encouraged him to change positions slowly. 3: Obstructive sleep apnea- Patient says that he's wearing his CPAP on a nightly basis along with his oxygen at 5L and reports that he's sleeping well.  4: Diabetes- He says that his glucose was 127 yesterday morning. He follows closely with his endocrinologist regarding this.  Patient did not bring his medications nor a list. Each medication was verbally reviewed with the patient and he was encouraged to bring the bottles to every visit to confirm accuracy of list. They have to go home and double check the metoprolol dose as they think he's taking 50mg  twice daily.   Return here in 3 months or sooner for any questions/problems before then.

## 2016-04-28 ENCOUNTER — Encounter: Payer: Self-pay | Admitting: *Deleted

## 2016-04-28 ENCOUNTER — Encounter: Payer: PPO | Attending: Cardiology | Admitting: *Deleted

## 2016-04-28 VITALS — Ht 67.25 in | Wt 250.3 lb

## 2016-04-28 DIAGNOSIS — I214 Non-ST elevation (NSTEMI) myocardial infarction: Secondary | ICD-10-CM

## 2016-04-28 DIAGNOSIS — I5022 Chronic systolic (congestive) heart failure: Secondary | ICD-10-CM | POA: Diagnosis not present

## 2016-04-28 DIAGNOSIS — R234 Changes in skin texture: Secondary | ICD-10-CM | POA: Diagnosis not present

## 2016-04-28 DIAGNOSIS — L570 Actinic keratosis: Secondary | ICD-10-CM | POA: Diagnosis not present

## 2016-04-28 DIAGNOSIS — H61031 Chondritis of right external ear: Secondary | ICD-10-CM | POA: Diagnosis not present

## 2016-04-28 NOTE — Progress Notes (Deleted)
Cardiac Individual Treatment Plan  Patient Details  Name: [name] MRN: [id number] Date of Birth: February 14, 1946 Referring Provider:        Cardiac Rehab from 04/28/2016 in [facility] Cardiac and Pulmonary Rehab   Referring Provider  Delora Fuel MD      Initial Encounter Date:       Cardiac Rehab from 04/28/2016 in [facility] Cardiac and Pulmonary Rehab   Date  04/28/16   Referring Provider  Delora Fuel MD      Visit Diagnosis: NSTEMI (non-ST elevated myocardial infarction) ([place])  Chronic systolic heart failure ([place])  Patient's Home Medications on Admission:  Current outpatient prescriptions:  .  acetaminophen (TYLENOL) 325 MG tablet, Take 2 tablets (650 mg total) by mouth every 6 (six) hours as needed for mild pain (or Fever >/= 101)., Disp: , Rfl:  .  albuterol (PROVENTIL HFA;VENTOLIN HFA) 108 (90 Base) MCG/ACT inhaler, Inhale 2 puffs into the lungs 3 (three) times daily as needed for wheezing or shortness of breath., Disp: , Rfl:  .  albuterol-ipratropium (COMBIVENT) 18-103 MCG/ACT inhaler, Inhale 1 puff into the lungs 2 times daily at 12 noon and 4 pm., Disp: , Rfl:  .  aspirin 81 MG tablet, Take 81 mg by mouth every morning., Disp: , Rfl:  .  atorvastatin (LIPITOR) 40 MG tablet, Take 80 mg by mouth every evening. , Disp: , Rfl:  .  budesonide-formoterol (SYMBICORT) 80-4.5 MCG/ACT inhaler, Inhale 2 puffs into the lungs 2 (two) times daily., Disp: , Rfl:  .  clopidogrel (PLAVIX) 75 MG tablet, Take 75 mg by mouth every morning., Disp: , Rfl:  .  ferrous sulfate 325 (65 FE) MG tablet, Take 325 mg by mouth 2 (two) times daily with a meal., Disp: , Rfl:  .  finasteride (PROSCAR) 5 MG tablet, Take 5 mg by mouth at bedtime., Disp: , Rfl:  .  FLUoxetine (PROZAC) 20 MG tablet, Take 20 mg by mouth every morning., Disp: , Rfl:  .  furosemide (LASIX) 40 MG tablet, Take 40 mg by mouth every morning., Disp: , Rfl:  .  gabapentin (NEURONTIN) 300 MG capsule, Take 600 mg by mouth 3 (three) times  daily., Disp: , Rfl:  .  insulin glargine (LANTUS) 100 UNIT/ML injection, Inject 0.75 mLs (75 Units total) into the skin at bedtime. (Patient taking differently: Inject 60 Units into the skin at bedtime. ), Disp: 10 mL, Rfl: 11 .  lisinopril (PRINIVIL,ZESTRIL) 20 MG tablet, Take 20 mg by mouth daily., Disp: , Rfl:  .  loratadine (CLARITIN) 10 MG tablet, Take 10 mg by mouth daily., Disp: , Rfl:  .  metFORMIN (GLUCOPHAGE) 1000 MG tablet, Take 1,000 mg by mouth 2 (two) times daily with a meal. , Disp: , Rfl:  .  metoprolol (LOPRESSOR) 50 MG tablet, Take 50 mg by mouth 2 (two) times daily., Disp: , Rfl:  .  Multiple Vitamins-Minerals (CENTRUM SILVER PO), Take 1 tablet by mouth every morning., Disp: , Rfl:  .  nitroGLYCERIN (NITROSTAT) 0.4 MG SL tablet, Place 0.4 mg under the tongue every 5 (five) minutes as needed for chest pain., Disp: , Rfl:  .  ranolazine (RANEXA) 500 MG 12 hr tablet, Take 500 mg by mouth 2 (two) times daily., Disp: , Rfl:  .  tamsulosin (FLOMAX) 0.4 MG CAPS capsule, Take 0.4 mg by mouth daily after supper., Disp: , Rfl:  .  dexlansoprazole (DEXILANT) 60 MG capsule, Take 60 mg by mouth daily. Reported on 04/28/2016, Disp: , Rfl:  Past Medical History: Past Medical History  Diagnosis Date  . Anemia   . Cancer ([place]) 12/2013    prostate  . Shortness of breath dyspnea   . COPD (chronic obstructive pulmonary disease) ([place])   . Cardiogenic pulmonary edema ([place]) 12/19/2014  . Diabetes mellitus without complication ([place])   . Hypertension   . Hypercholesteremia   . CHF (congestive heart failure) ([place])   . Cardiomyopathy, ischemic   . GERD (gastroesophageal reflux disease)   [id number]    Tobacco Use: History  Smoking status  . Former Smoker -- 2.00 packs/day for 30 years  . Quit date: 12/03/1996  Smokeless tobacco  . Never Used    Labs: Recent Review Flowsheet Data    Labs for ITP Cardiac and Pulmonary Rehab Latest Ref Rng 01/05/2014  01/31/2016   Cholestrol 0-200 mg/dL 140 -   LDLCALC 0-100 mg/dL 86 -   HDL 40-60 mg/dL 33(L) -   Trlycerides 0-200 mg/dL 103 -   HCO3 21.0 - 28.0 mEq/L - 20.2(L)   ACIDBASEDEF 0.0 - 2.0 mmol/L - 4.4(H)   O2SAT - - 92.4       Exercise Target Goals: Date: 04/28/16  Exercise Program Goal: Individual exercise prescription set with THRR, safety & activity barriers. Participant demonstrates ability to understand and report RPE using BORG scale, to self-measure pulse accurately, and to acknowledge the importance of the exercise prescription.  Exercise Prescription Goal: Starting with aerobic activity 30 plus minutes a day, 3 days per week for initial exercise prescription. Provide home exercise prescription and guidelines that participant acknowledges understanding prior to discharge.  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     Cardiac Risk Stratification High      6 Minute Walk:     6 Minute Walk      04/28/16 1449       6 Minute Walk   Phase Initial     Distance 890 feet     Walk Time 6 minutes     # of Rest Breaks 0     MPH 1.69     METS 1.62     RPE 17     VO2 Peak 5.67     Symptoms Yes (comment)     Comments Neuropathy, claudication, 2/10 chest tightness (possibly breathing)     Resting HR 68 bpm     Resting BP 112/60 mmHg     Max Ex. HR 110 bpm     Max Ex. BP 138/64 mmHg     Interval Oxygen   Interval Oxygen? Yes     Baseline Oxygen Saturation % 98 %     Baseline Liters of Oxygen 0 L  Room Air     1 Minute Oxygen Saturation % 98 %     1 Minute Liters of Oxygen 0 L  Room Air     3 Minute Oxygen Saturation % 95 %     3 Minute Liters of Oxygen 0 L     5 Minute  Oxygen Saturation % 91 %     5 Minute Liters of Oxygen 0 L     6 Minute Oxygen Saturation % 91 %     6 Minute Liters of Oxygen 0 L  Initial Exercise Prescription:     Initial Exercise Prescription - 04/28/16 1500    Date of Initial Exercise RX and Referring Provider   Date 04/28/16   Referring Provider Delora Fuel MD   Treadmill   MPH 1   Grade 0   Minutes 10  5/5   METs 1.7   Bike   Level 0.4   Watts 20   Minutes 15   METs 1.6   Recumbant Bike   Level 1   RPM 50   Watts 20   Minutes 15   METs 1.6   NuStep   Level 1   Watts 20   Minutes 15   METs 1.6   Arm Ergometer   Level 1   Watts 20   Minutes 15   METs 1.6   Arm/Foot Ergometer   Level 1   Watts 20   Minutes 15   METs 1.6   Cybex   Level 1   RPM 50   Minutes 15   METs 1.6   Recumbant Elliptical   Level 1   RPM 50   Watts 20   Minutes 15   METs 1.6   Elliptical   Level 1   Speed 50   Minutes 15   METs 1.7   REL-XR   Level 1   Watts 20   Minutes 15   METs 1.6   T5 Nustep   Level 1   Watts 20   Minutes 15   METs 1.6   Biostep-RELP   Level 1   Watts 20   Minutes 15   METs 1.6   Prescription Details   Frequency (times per week) 3   Duration Progress to 30 minutes of continuous aerobic without signs/symptoms of physical distress   Intensity   THRR 40-80% of Max Heartrate 100-133   Ratings of Perceived Exertion 11-13   Perceived Dyspnea 0-4   Progression   Progression Continue to progress workloads to maintain intensity without signs/symptoms of physical distress.   Resistance Training   Training Prescription Yes   Weight 2lbs   Reps 10-12      Perform Capillary Blood Glucose checks as needed.  Exercise Prescription Changes:   Exercise Comments:     Exercise Comments      04/28/16 1454           Exercise Comments Pt surprised himself on how far he was able to walk.  Pt would really like to focus on increasing his exercise stamina.          Discharge  Exercise Prescription (Final Exercise Prescription Changes):   Nutrition:  Target Goals: Understanding of nutrition guidelines, daily intake of sodium 1500mg , cholesterol 200mg , calories 30% from fat and 7% or less from saturated fats, daily to have 5 or more servings of fruits and vegetables.  Biometrics:     Pre Biometrics - 04/28/16 1507    Pre Biometrics   Height 5' 7.25" (1.708 m)   Weight 250 lb 4.8 oz (113.535 kg)   Waist Circumference 46 inches   Hip Circumference 45.5 inches   Waist to Hip Ratio 1.01 %   BMI (Calculated) 39       Nutrition Therapy Plan and Nutrition Goals:   Nutrition Discharge: Rate Your Plate Scores:   Nutrition Goals Re-Evaluation:   Psychosocial: Target Goals: Acknowledge presence or absence of depression, maximize coping skills, provide positive support system. Participant is able to verbalize types and ability to use techniques and skills needed  for reducing stress and depression.  Initial Review & Psychosocial Screening:     Initial Psych Review & Screening - 04/28/16 1512    Initial Review   Current issues with Current Depression;Current Stress Concerns;Current Anxiety/Panic   Source of Stress Concerns Chronic Illness;Poor Coping Skills;Unable to participate in former interests or hobbies;Unable to perform yard/household activities   [place]? Yes   Concerns Inappropriate over/under dependence on family/friends   Comments Wife states since his last admission in April 2017 he has seemed insecure and does not want her out of his site.  She stated he will not even go to the shop by himself.  He is insisting that she will come with him to Cardiac Rehab.     Screening Interventions   Interventions Encouraged to exercise;Program counselor consult;Other (comment)   Comments PHQ-9 score was 14 today.  Presently on Prozac.  Upon asking patient if he had ever seen a counselor he said, "No."  Wife stated she thought  it would be a good idea for him to talk with someone besides her.  Will defer to Program Counselor for evaluation and treatment.  Email sent to Program       Quality of Life Scores:   PHQ-9:     Recent Review Flowsheet Data    Depression screen [facility] 2/9 04/28/2016 04/05/2016 02/13/2016   Decreased Interest 3 0 0   Down, Depressed, Hopeless 3 0 0   PHQ - 2 Score 6 0 0   Altered sleeping 0 - -   Tired, decreased energy 3 - -   Change in appetite 2 - -   Feeling bad or failure about yourself  3 - -   Trouble concentrating 0 - -   Moving slowly or fidgety/restless 0 - -   Suicidal thoughts 0 - -   PHQ-9 Score 14 - -   Difficult doing work/chores Very difficult - -      Psychosocial Evaluation and Intervention:   Psychosocial Re-Evaluation:   Vocational Rehabilitation: Provide vocational rehab assistance to qualifying candidates.   Vocational Rehab Evaluation & Intervention:     Vocational Rehab - 04/28/16 1344    Initial Vocational Rehab Evaluation & Intervention   Assessment shows need for Vocational Rehabilitation No      Education: Education Goals: Education classes will be provided on a weekly basis, covering required topics. Participant will state understanding/return demonstration of topics presented.  Learning Barriers/Preferences:     Learning Barriers/Preferences - 04/28/16 1342    Learning Barriers/Preferences   Learning Barriers Hearing;Exercise Concerns   Learning Preferences Skilled Demonstration      Education Topics: General Nutrition Guidelines/Fats and Fiber: -Group instruction provided by verbal, written material, models and posters to present the general guidelines for heart healthy nutrition. Gives an explanation and review of dietary fats and fiber.   Controlling Sodium/Reading Food Labels: -Group verbal and written material supporting the discussion of sodium use in heart healthy nutrition. Review and explanation with models, verbal and  written materials for utilization of the food label.   Exercise Physiology & Risk Factors: - Group verbal and written instruction with models to review the exercise physiology of the cardiovascular system and associated critical values. Details cardiovascular disease risk factors and the goals associated with each risk factor.   Aerobic Exercise & Resistance Training: - Gives group verbal and written discussion on the health impact of inactivity. On the components of aerobic and resistive training programs and the benefits of  this training and how to safely progress through these programs.   Flexibility, Balance, General Exercise Guidelines: - Provides group verbal and written instruction on the benefits of flexibility and balance training programs. Provides general exercise guidelines with specific guidelines to those with heart or lung disease. Demonstration and skill practice provided.   Stress Management: - Provides group verbal and written instruction about the health risks of elevated stress, cause of high stress, and healthy ways to reduce stress.   Depression: - Provides group verbal and written instruction on the correlation between heart/lung disease and depressed mood, treatment options, and the stigmas associated with seeking treatment.   Anatomy & Physiology of the Heart: - Group verbal and written instruction and models provide basic cardiac anatomy and physiology, with the coronary electrical and arterial systems. Review of: AMI, Angina, Valve disease, Heart Failure, Cardiac Arrhythmia, Pacemakers, and the ICD.   Cardiac Procedures: - Group verbal and written instruction and models to describe the testing methods done to diagnose heart disease. Reviews the outcomes of the test results. Describes the treatment choices: Medical Management, Angioplasty, or Coronary Bypass Surgery.   Cardiac Medications: - Group verbal and written instruction to review commonly prescribed  medications for heart disease. Reviews the medication, class of the drug, and side effects. Includes the steps to properly store meds and maintain the prescription regimen.   Go Sex-Intimacy & Heart Disease, Get SMART - Goal Setting: - Group verbal and written instruction through game format to discuss heart disease and the return to sexual intimacy. Provides group verbal and written material to discuss and apply goal setting through the application of the S.M.A.R.T. Method.   Other Matters of the Heart: - Provides group verbal, written materials and models to describe Heart Failure, Angina, Valve Disease, and Diabetes in the realm of heart disease. Includes description of the disease process and treatment options available to the cardiac patient.   Exercise & Equipment Safety: - Individual verbal instruction and demonstration of equipment use and safety with use of the equipment.          Cardiac Rehab from 04/28/2016 in [facility] Cardiac and Pulmonary Rehab   Date  04/28/16   Educator  D.W.   Instruction Review Code  1- partially meets, needs review/practice      Infection Prevention: - Provides verbal and written material to individual with discussion of infection control including proper hand washing and proper equipment cleaning during exercise session.      Cardiac Rehab from 04/28/2016 in [facility] Cardiac and Pulmonary Rehab   Date  04/28/16   Educator  DW   Instruction Review Code  2- meets goals/outcomes      Falls Prevention: - Provides verbal and written material to individual with discussion of falls prevention and safety.      Cardiac Rehab from 04/28/2016 in [facility] Cardiac and Pulmonary Rehab   Date  04/28/16   Educator  DW   Instruction Review Code  2- meets goals/outcomes      Diabetes: - Individual verbal and written instruction to review signs/symptoms of diabetes, desired ranges of glucose level fasting, after meals and with exercise. Advice that pre and post exercise  glucose checks will be done for 3 sessions at entry of program.      Cardiac Rehab from 04/28/2016 in [facility] Cardiac and Pulmonary Rehab   Date  04/28/16   Educator  DW   Instruction Review Code  2- meets goals/outcomes       Knowledge Questionnaire Score:  Core Components/Risk Factors/Patient Goals at Admission:     Personal Goals and Risk Factors at Admission - 04/28/16 1508    Core Components/Risk Factors/Patient Goals on Admission    Weight Management Weight Loss;Obesity   Sedentary Yes   Intervention Provide advice, education, support and counseling about physical activity/exercise needs.;Develop an individualized exercise prescription for aerobic and resistive training based on initial evaluation findings, risk stratification, comorbidities and participant's personal goals.   Expected Outcomes Achievement of increased cardiorespiratory fitness and enhanced flexibility, muscular endurance and strength shown through measurements of functional capacity and personal statement of participant.   Increase Strength and Stamina Yes   Intervention Provide advice, education, support and counseling about physical activity/exercise needs.;Develop an individualized exercise prescription for aerobic and resistive training based on initial evaluation findings, risk stratification, comorbidities and participant's personal goals.   Expected Outcomes Achievement of increased cardiorespiratory fitness and enhanced flexibility, muscular endurance and strength shown through measurements of functional capacity and personal statement of participant.   Improve shortness of breath with ADL's Yes   Intervention Provide education, individualized exercise plan and daily activity instruction to help decrease symptoms of SOB with activities of daily living.   Expected Outcomes Short Term: Achieves a reduction of symptoms when performing activities of daily living.   Diabetes Yes   Intervention Provide education  about proper nutrition, including hydration, and aerobic/resistive exercise prescription along with prescribed medications to achieve blood glucose in normal ranges: Fasting glucose 65-99 mg/dL;Provide education about signs/symptoms and action to take for hypo/hyperglycemia.   Expected Outcomes Short Term: Participant verbalizes understanding of the signs/symptoms and immediate care of hyper/hypoglycemia, proper foot care and importance of medication, aerobic/resistive exercise and nutrition plan for blood glucose control.;Long Term: Attainment of HbA1C < 7%.   Heart Failure Yes   Intervention Provide a combined exercise and nutrition program that is supplemented with education, support and counseling about heart failure. Directed toward relieving symptoms such as shortness of breath, decreased exercise tolerance, and extremity edema.   Expected Outcomes Improve functional capacity of life;Short term: Attendance in program 2-3 days a week with increased exercise capacity. Reported lower sodium intake. Reported increased fruit and vegetable intake. Reports medication compliance.;Short term: Daily weights obtained and reported for increase. Utilizing diuretic protocols set by physician.;Long term: Adoption of self-care skills and reduction of barriers for early signs and symptoms recognition and intervention leading to self-care maintenance.   Hypertension Yes   Intervention Provide education on lifestyle modifcations including regular physical activity/exercise, weight management, moderate sodium restriction and increased consumption of fresh fruit, vegetables, and low fat dairy, alcohol moderation, and smoking cessation.;Monitor prescription use compliance.   Expected Outcomes Short Term: Continued assessment and intervention until BP is < 140/20mm HG in hypertensive participants. < 130/7mm HG in hypertensive participants with diabetes, heart failure or chronic kidney disease.;Long Term: Maintenance of blood  pressure at goal levels.   Lipids Yes   Intervention Provide education and support for participant on nutrition & aerobic/resistive exercise along with prescribed medications to achieve LDL 70mg , HDL >40mg .   Expected Outcomes Short Term: Participant states understanding of desired cholesterol values and is compliant with medications prescribed. Participant is following exercise prescription and nutrition guidelines.;Long Term: Cholesterol controlled with medications as prescribed, with individualized exercise RX and with personalized nutrition plan. Value goals: LDL < 70mg , HDL > 40 mg.   Stress Yes   Intervention Offer individual and/or small group education and counseling on adjustment to heart disease, stress management and health-related lifestyle change. Teach and  support self-help strategies.;Refer participants experiencing significant psychosocial distress to appropriate mental health specialists for further evaluation and treatment. When possible, include family members and significant others in education/counseling sessions.   Expected Outcomes Short Term: Participant demonstrates changes in health-related behavior, relaxation and other stress management skills, ability to obtain effective social support, and compliance with psychotropic medications if prescribed.;Long Term: Emotional wellbeing is indicated by absence of clinically significant psychosocial distress or social isolation.      Core Components/Risk Factors/Patient Goals Review:    Core Components/Risk Factors/Patient Goals at Discharge (Final Review):    ITP Comments:     ITP Comments      04/28/16 1706           ITP Comments [name] "[name]" presented today for Cardiac Rehab Orientation.  He has a long history of CAD.  S/P MI in 1990. 1194, 1997.  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  He has had one stent placed in bypass graft approximately a month or two after CABG.  He  reports taking Nitroglycerin SL when he has GERD symptoms of burning  in the middle of his chest and throat.  He only takes one per pt. and it always relieves his GERD.  [name] stated MD is aware.  He also reports taking one nitroglycerin when he has tingling in his left chest.  One nitroglycerin relieves this tingling feeling he gets from time to time.  He says if he gets GERD or tingling in his left chest he will stop what he is doing and take nitroglycerin.  The most he has taken in one day according to patient is 5 tablets.  RN will reach out to [facility] to make her aware of the Nitroglycerin use.  Wife, [name], accompanied patient to orientation.  Ellen's cell number is:  [phone]; Home [phone]; [name]'s Cell [phone].  [name]'s main goal in coming to Cardiac Rehab is increase stamina; decrease SOB; weight loss.  [name] states he would like to weigh less than 200 pounds.  Currently his weight is 250.5 pounds.  [name] will start Cardiac Rehab on Monday morning June 5th at 7:30 a.m.  He will be out of town June 6th - 16th; July 9th - 14th; August 6th - 11th; September 10th - 15th.              Comments:  [name] will start Cardiac Rehab on Monday, May 10, 2016 at 7:30 a.m.  Note:  Dr. Niger Reed at [facility] is patient's [place] PCP.  Phone number [phone]. Fax [phone].   Dr. Kyla Balzarine at [facility] is patient's Cardiologist.  Dr. Netty Starring at [facility] is patient's PCP outside [place].  Patient also has a pulmonologist that he sees at the [place].  In addition, he is seen in the HF Clinic at [facility] by [facility].

## 2016-04-28 NOTE — Patient Instructions (Signed)
Patient Instructions  Patient Details  Name: [name] MRN: [id number] Date of Birth: 12-Oct-1946 Referring Provider:  Fransico Him,*  Below are the personal goals you chose as well as exercise and nutrition goals. Our goal is to help you keep on track towards obtaining and maintaining your goals. We will be discussing your progress on these goals with you throughout the program.  Initial Exercise Prescription:     Initial Exercise Prescription - 04/28/16 1500    Date of Initial Exercise RX and Referring Provider   Date 04/28/16   Referring Provider Delora Fuel MD   Treadmill   MPH 1   Grade 0   Minutes 10  5/5   METs 1.7   Bike   Level 0.4   Watts 20   Minutes 15   METs 1.6   Recumbant Bike   Level 1   RPM 50   Watts 20   Minutes 15   METs 1.6   NuStep   Level 1   Watts 20   Minutes 15   METs 1.6   Arm Ergometer   Level 1   Watts 20   Minutes 15   METs 1.6   Arm/Foot Ergometer   Level 1   Watts 20   Minutes 15   METs 1.6   Cybex   Level 1   RPM 50   Minutes 15   METs 1.6   Recumbant Elliptical   Level 1   RPM 50   Watts 20   Minutes 15   METs 1.6   Elliptical   Level 1   Speed 50   Minutes 15   METs 1.7   REL-XR   Level 1   Watts 20   Minutes 15   METs 1.6   T5 Nustep   Level 1   Watts 20   Minutes 15   METs 1.6   Biostep-RELP   Level 1   Watts 20   Minutes 15   METs 1.6   Prescription Details   Frequency (times per week) 3   Duration Progress to 30 minutes of continuous aerobic without signs/symptoms of physical distress   Intensity   THRR 40-80% of Max Heartrate 100-133   Ratings of Perceived Exertion 11-13   Perceived Dyspnea 0-4   Progression   Progression Continue to progress workloads to maintain intensity without signs/symptoms of physical distress.   Resistance Training   Training Prescription Yes   Weight 2lbs   Reps 10-12      Exercise Goals: Frequency: Be able to perform aerobic exercise  three times per week working toward 3-5 days per week.  Intensity: Work with a perceived exertion of 11 (fairly light) - 15 (hard) as tolerated. Follow your new exercise prescription and watch for changes in prescription as you progress with the program. Changes will be reviewed with you when they are made.  Duration: You should be able to do 30 minutes of continuous aerobic exercise in addition to a 5 minute warm-up and a 5 minute cool-down routine.  Nutrition Goals: Your personal nutrition goals will be established when you do your nutrition analysis with the dietician.  The following are nutrition guidelines to follow: Cholesterol < 200mg /day Sodium < 1500mg /day Fiber: Men over 50 yrs - 30 grams per day  Personal Goals:     Personal Goals and Risk Factors at Admission - 04/28/16 1508    Core Components/Risk Factors/Patient Goals on Admission    Weight Management Weight Loss;Obesity  Sedentary Yes   Intervention Provide advice, education, support and counseling about physical activity/exercise needs.;Develop an individualized exercise prescription for aerobic and resistive training based on initial evaluation findings, risk stratification, comorbidities and participant's personal goals.   Expected Outcomes Achievement of increased cardiorespiratory fitness and enhanced flexibility, muscular endurance and strength shown through measurements of functional capacity and personal statement of participant.   Increase Strength and Stamina Yes   Intervention Provide advice, education, support and counseling about physical activity/exercise needs.;Develop an individualized exercise prescription for aerobic and resistive training based on initial evaluation findings, risk stratification, comorbidities and participant's personal goals.   Expected Outcomes Achievement of increased cardiorespiratory fitness and enhanced flexibility, muscular endurance and strength shown through measurements of  functional capacity and personal statement of participant.   Improve shortness of breath with ADL's Yes   Intervention Provide education, individualized exercise plan and daily activity instruction to help decrease symptoms of SOB with activities of daily living.   Expected Outcomes Short Term: Achieves a reduction of symptoms when performing activities of daily living.   Diabetes Yes   Intervention Provide education about proper nutrition, including hydration, and aerobic/resistive exercise prescription along with prescribed medications to achieve blood glucose in normal ranges: Fasting glucose 65-99 mg/dL;Provide education about signs/symptoms and action to take for hypo/hyperglycemia.   Expected Outcomes Short Term: Participant verbalizes understanding of the signs/symptoms and immediate care of hyper/hypoglycemia, proper foot care and importance of medication, aerobic/resistive exercise and nutrition plan for blood glucose control.;Long Term: Attainment of HbA1C < 7%.   Heart Failure Yes   Intervention Provide a combined exercise and nutrition program that is supplemented with education, support and counseling about heart failure. Directed toward relieving symptoms such as shortness of breath, decreased exercise tolerance, and extremity edema.   Expected Outcomes Improve functional capacity of life;Short term: Attendance in program 2-3 days a week with increased exercise capacity. Reported lower sodium intake. Reported increased fruit and vegetable intake. Reports medication compliance.;Short term: Daily weights obtained and reported for increase. Utilizing diuretic protocols set by physician.;Long term: Adoption of self-care skills and reduction of barriers for early signs and symptoms recognition and intervention leading to self-care maintenance.   Hypertension Yes   Intervention Provide education on lifestyle modifcations including regular physical activity/exercise, weight management, moderate  sodium restriction and increased consumption of fresh fruit, vegetables, and low fat dairy, alcohol moderation, and smoking cessation.;Monitor prescription use compliance.   Expected Outcomes Short Term: Continued assessment and intervention until BP is < 140/40mm HG in hypertensive participants. < 130/28mm HG in hypertensive participants with diabetes, heart failure or chronic kidney disease.;Long Term: Maintenance of blood pressure at goal levels.   Lipids Yes   Intervention Provide education and support for participant on nutrition & aerobic/resistive exercise along with prescribed medications to achieve LDL 70mg , HDL >40mg .   Expected Outcomes Short Term: Participant states understanding of desired cholesterol values and is compliant with medications prescribed. Participant is following exercise prescription and nutrition guidelines.;Long Term: Cholesterol controlled with medications as prescribed, with individualized exercise RX and with personalized nutrition plan. Value goals: LDL < 70mg , HDL > 40 mg.   Stress Yes   Intervention Offer individual and/or small group education and counseling on adjustment to heart disease, stress management and health-related lifestyle change. Teach and support self-help strategies.;Refer participants experiencing significant psychosocial distress to appropriate mental health specialists for further evaluation and treatment. When possible, include family members and significant others in education/counseling sessions.   Expected Outcomes Short Term: Participant demonstrates changes  in health-related behavior, relaxation and other stress management skills, ability to obtain effective social support, and compliance with psychotropic medications if prescribed.;Long Term: Emotional wellbeing is indicated by absence of clinically significant psychosocial distress or social isolation.      Tobacco Use Initial Evaluation: History  Smoking status  . Former Smoker  . Quit  date: 12/03/1996  Smokeless tobacco  . Never Used    Copy of goals given to participant.

## 2016-04-28 NOTE — Progress Notes (Signed)
Cardiac Individual Treatment Plan  Patient Details  Name: [name] MRN: [id number] Date of Birth: November 11, 1946 Referring Provider:        Cardiac Rehab from 04/28/2016 in [facility] Cardiac and Pulmonary Rehab   Referring Provider  Delora Fuel MD      Initial Encounter Date:       Cardiac Rehab from 04/28/2016 in [facility] Cardiac and Pulmonary Rehab   Date  04/28/16   Referring Provider  Delora Fuel MD      Visit Diagnosis: NSTEMI (non-ST elevated myocardial infarction) ([place])  Chronic systolic heart failure ([place])  Patient's Home Medications on Admission:  Current outpatient prescriptions:  .  acetaminophen (TYLENOL) 325 MG tablet, Take 2 tablets (650 mg total) by mouth every 6 (six) hours as needed for mild pain (or Fever >/= 101)., Disp: , Rfl:  .  albuterol (PROVENTIL HFA;VENTOLIN HFA) 108 (90 Base) MCG/ACT inhaler, Inhale 2 puffs into the lungs 3 (three) times daily as needed for wheezing or shortness of breath., Disp: , Rfl:  .  albuterol-ipratropium (COMBIVENT) 18-103 MCG/ACT inhaler, Inhale 1 puff into the lungs 2 times daily at 12 noon and 4 pm., Disp: , Rfl:  .  aspirin 81 MG tablet, Take 81 mg by mouth every morning., Disp: , Rfl:  .  atorvastatin (LIPITOR) 40 MG tablet, Take 80 mg by mouth every evening. , Disp: , Rfl:  .  budesonide-formoterol (SYMBICORT) 80-4.5 MCG/ACT inhaler, Inhale 2 puffs into the lungs 2 (two) times daily., Disp: , Rfl:  .  clopidogrel (PLAVIX) 75 MG tablet, Take 75 mg by mouth every morning., Disp: , Rfl:  .  ferrous sulfate 325 (65 FE) MG tablet, Take 325 mg by mouth 2 (two) times daily with a meal., Disp: , Rfl:  .  finasteride (PROSCAR) 5 MG tablet, Take 5 mg by mouth at bedtime., Disp: , Rfl:  .  FLUoxetine (PROZAC) 20 MG tablet, Take 20 mg by mouth every morning., Disp: , Rfl:  .  furosemide (LASIX) 40 MG tablet, Take 40 mg by mouth every morning., Disp: , Rfl:  .  gabapentin (NEURONTIN) 300 MG capsule, Take 600 mg by mouth 3 (three) times  daily., Disp: , Rfl:  .  insulin glargine (LANTUS) 100 UNIT/ML injection, Inject 0.75 mLs (75 Units total) into the skin at bedtime. (Patient taking differently: Inject 60 Units into the skin at bedtime. ), Disp: 10 mL, Rfl: 11 .  lisinopril (PRINIVIL,ZESTRIL) 20 MG tablet, Take 20 mg by mouth daily., Disp: , Rfl:  .  loratadine (CLARITIN) 10 MG tablet, Take 10 mg by mouth daily., Disp: , Rfl:  .  metFORMIN (GLUCOPHAGE) 1000 MG tablet, Take 1,000 mg by mouth 2 (two) times daily with a meal. , Disp: , Rfl:  .  metoprolol (LOPRESSOR) 50 MG tablet, Take 50 mg by mouth 2 (two) times daily., Disp: , Rfl:  .  Multiple Vitamins-Minerals (CENTRUM SILVER PO), Take 1 tablet by mouth every morning., Disp: , Rfl:  .  nitroGLYCERIN (NITROSTAT) 0.4 MG SL tablet, Place 0.4 mg under the tongue every 5 (five) minutes as needed for chest pain., Disp: , Rfl:  .  ranolazine (RANEXA) 500 MG 12 hr tablet, Take 500 mg by mouth 2 (two) times daily., Disp: , Rfl:  .  tamsulosin (FLOMAX) 0.4 MG CAPS capsule, Take 0.4 mg by mouth daily after supper., Disp: , Rfl:  .  dexlansoprazole (DEXILANT) 60 MG capsule, Take 60 mg by mouth daily. Reported on 04/28/2016, Disp: , Rfl:  Past Medical History: Past Medical History  Diagnosis Date  . Anemia   . Cancer ([place]) 12/2013    prostate  . Shortness of breath dyspnea   . COPD (chronic obstructive pulmonary disease) ([place])   . Cardiogenic pulmonary edema ([place]) 12/19/2014  . Diabetes mellitus without complication ([place])   . Hypertension   . Hypercholesteremia   . CHF (congestive heart failure) ([place])   . Cardiomyopathy, ischemic   . GERD (gastroesophageal reflux disease)   . [id number]    Tobacco Use: History  Smoking status  . Former Smoker -- 2.00 packs/day for 30 years  . Quit date: 12/03/1996  Smokeless tobacco  . Never Used    Labs: Recent Review Flowsheet Data    Labs for ITP Cardiac and Pulmonary Rehab Latest Ref Rng 01/05/2014  01/31/2016   Cholestrol 0-200 mg/dL 140 -   LDLCALC 0-100 mg/dL 86 -   HDL 40-60 mg/dL 33(L) -   Trlycerides 0-200 mg/dL 103 -   HCO3 21.0 - 28.0 mEq/L - 20.2(L)   ACIDBASEDEF 0.0 - 2.0 mmol/L - 4.4(H)   O2SAT - - 92.4       Exercise Target Goals: Date: 04/28/16  Exercise Program Goal: Individual exercise prescription set with THRR, safety & activity barriers. Participant demonstrates ability to understand and report RPE using BORG scale, to self-measure pulse accurately, and to acknowledge the importance of the exercise prescription.  Exercise Prescription Goal: Starting with aerobic activity 30 plus minutes a day, 3 days per week for initial exercise prescription. Provide home exercise prescription and guidelines that participant acknowledges understanding prior to discharge.  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     Cardiac Risk Stratification High      6 Minute Walk:     6 Minute Walk      04/28/16 1449       6 Minute Walk   Phase Initial     Distance 890 feet     Walk Time 6 minutes     # of Rest Breaks 0     MPH 1.69     METS 1.62     RPE 17     VO2 Peak 5.67     Symptoms Yes (comment)     Comments Neuropathy, claudication, 2/10 chest tightness (possibly breathing)     Resting HR 68 bpm     Resting BP 112/60 mmHg     Max Ex. HR 110 bpm     Max Ex. BP 138/64 mmHg     Interval Oxygen   Interval Oxygen? Yes     Baseline Oxygen Saturation % 98 %     Baseline Liters of Oxygen 0 L  Room Air     1 Minute Oxygen Saturation % 98 %     1 Minute Liters of Oxygen 0 L  Room Air     3 Minute Oxygen Saturation % 95 %     3 Minute Liters of Oxygen 0 L     5 Minute  Oxygen Saturation % 91 %     5 Minute Liters of Oxygen 0 L     6 Minute Oxygen Saturation % 91 %     6 Minute Liters of Oxygen 0 L  Initial Exercise Prescription:     Initial Exercise Prescription - 04/28/16 1500    Date of Initial Exercise RX and Referring Provider   Date 04/28/16   Referring Provider Delora Fuel MD   Treadmill   MPH 1   Grade 0   Minutes 10  5/5   METs 1.7   Bike   Level 0.4   Watts 20   Minutes 15   METs 1.6   Recumbant Bike   Level 1   RPM 50   Watts 20   Minutes 15   METs 1.6   NuStep   Level 1   Watts 20   Minutes 15   METs 1.6   Arm Ergometer   Level 1   Watts 20   Minutes 15   METs 1.6   Arm/Foot Ergometer   Level 1   Watts 20   Minutes 15   METs 1.6   Cybex   Level 1   RPM 50   Minutes 15   METs 1.6   Recumbant Elliptical   Level 1   RPM 50   Watts 20   Minutes 15   METs 1.6   Elliptical   Level 1   Speed 50   Minutes 15   METs 1.7   REL-XR   Level 1   Watts 20   Minutes 15   METs 1.6   T5 Nustep   Level 1   Watts 20   Minutes 15   METs 1.6   Biostep-RELP   Level 1   Watts 20   Minutes 15   METs 1.6   Prescription Details   Frequency (times per week) 3   Duration Progress to 30 minutes of continuous aerobic without signs/symptoms of physical distress   Intensity   THRR 40-80% of Max Heartrate 100-133   Ratings of Perceived Exertion 11-13   Perceived Dyspnea 0-4   Progression   Progression Continue to progress workloads to maintain intensity without signs/symptoms of physical distress.   Resistance Training   Training Prescription Yes   Weight 2lbs   Reps 10-12      Perform Capillary Blood Glucose checks as needed.  Exercise Prescription Changes:   Exercise Comments:     Exercise Comments      04/28/16 1454           Exercise Comments Pt surprised himself on how far he was able to walk.  Pt would really like to focus on increasing his exercise stamina.          Discharge  Exercise Prescription (Final Exercise Prescription Changes):   Nutrition:  Target Goals: Understanding of nutrition guidelines, daily intake of sodium 1500mg , cholesterol 200mg , calories 30% from fat and 7% or less from saturated fats, daily to have 5 or more servings of fruits and vegetables.  Biometrics:     Pre Biometrics - 04/28/16 1507    Pre Biometrics   Height 5' 7.25" (1.708 m)   Weight 250 lb 4.8 oz (113.535 kg)   Waist Circumference 46 inches   Hip Circumference 45.5 inches   Waist to Hip Ratio 1.01 %   BMI (Calculated) 39       Nutrition Therapy Plan and Nutrition Goals:   Nutrition Discharge: Rate Your Plate Scores:   Nutrition Goals Re-Evaluation:   Psychosocial: Target Goals: Acknowledge presence or absence of depression, maximize coping skills, provide positive support system. Participant is able to verbalize types and ability to use techniques and skills needed  for reducing stress and depression.  Initial Review & Psychosocial Screening:     Initial Psych Review & Screening - 04/28/16 1512    Initial Review   Current issues with Current Depression;Current Stress Concerns;Current Anxiety/Panic   Source of Stress Concerns Chronic Illness;Poor Coping Skills;Unable to participate in former interests or hobbies;Unable to perform yard/household activities   Leal? Yes   Concerns Inappropriate over/under dependence on family/friends   Comments Wife states since his last admission in April 2017 he has seemed insecure and does not want her out of his site.  She stated he will not even go to the shop by himself.  He is insisting that she will come with him to Cardiac Rehab.     Screening Interventions   Interventions Encouraged to exercise;Program counselor consult;Other (comment)   Comments PHQ-9 score was 14 today.  Presently on Prozac.  Upon asking patient if he had ever seen a counselor he said, "No."  Wife stated she thought  it would be a good idea for him to talk with someone besides her.  Will defer to Program Counselor for evaluation and treatment.  Email sent to Program       Quality of Life Scores:   PHQ-9:     Recent Review Flowsheet Data    Depression screen [facility] 2/9 04/28/2016 04/05/2016 02/13/2016   Decreased Interest 3 0 0   Down, Depressed, Hopeless 3 0 0   PHQ - 2 Score 6 0 0   Altered sleeping 0 - -   Tired, decreased energy 3 - -   Change in appetite 2 - -   Feeling bad or failure about yourself  3 - -   Trouble concentrating 0 - -   Moving slowly or fidgety/restless 0 - -   Suicidal thoughts 0 - -   PHQ-9 Score 14 - -   Difficult doing work/chores Very difficult - -      Psychosocial Evaluation and Intervention:   Psychosocial Re-Evaluation:   Vocational Rehabilitation: Provide vocational rehab assistance to qualifying candidates.   Vocational Rehab Evaluation & Intervention:     Vocational Rehab - 04/28/16 1344    Initial Vocational Rehab Evaluation & Intervention   Assessment shows need for Vocational Rehabilitation No      Education: Education Goals: Education classes will be provided on a weekly basis, covering required topics. Participant will state understanding/return demonstration of topics presented.  Learning Barriers/Preferences:     Learning Barriers/Preferences - 04/28/16 1342    Learning Barriers/Preferences   Learning Barriers Hearing;Exercise Concerns   Learning Preferences Skilled Demonstration      Education Topics: General Nutrition Guidelines/Fats and Fiber: -Group instruction provided by verbal, written material, models and posters to present the general guidelines for heart healthy nutrition. Gives an explanation and review of dietary fats and fiber.   Controlling Sodium/Reading Food Labels: -Group verbal and written material supporting the discussion of sodium use in heart healthy nutrition. Review and explanation with models, verbal and  written materials for utilization of the food label.   Exercise Physiology & Risk Factors: - Group verbal and written instruction with models to review the exercise physiology of the cardiovascular system and associated critical values. Details cardiovascular disease risk factors and the goals associated with each risk factor.   Aerobic Exercise & Resistance Training: - Gives group verbal and written discussion on the health impact of inactivity. On the components of aerobic and resistive training programs and the benefits of  this training and how to safely progress through these programs.   Flexibility, Balance, General Exercise Guidelines: - Provides group verbal and written instruction on the benefits of flexibility and balance training programs. Provides general exercise guidelines with specific guidelines to those with heart or lung disease. Demonstration and skill practice provided.   Stress Management: - Provides group verbal and written instruction about the health risks of elevated stress, cause of high stress, and healthy ways to reduce stress.   Depression: - Provides group verbal and written instruction on the correlation between heart/lung disease and depressed mood, treatment options, and the stigmas associated with seeking treatment.   Anatomy & Physiology of the Heart: - Group verbal and written instruction and models provide basic cardiac anatomy and physiology, with the coronary electrical and arterial systems. Review of: AMI, Angina, Valve disease, Heart Failure, Cardiac Arrhythmia, Pacemakers, and the ICD.   Cardiac Procedures: - Group verbal and written instruction and models to describe the testing methods done to diagnose heart disease. Reviews the outcomes of the test results. Describes the treatment choices: Medical Management, Angioplasty, or Coronary Bypass Surgery.   Cardiac Medications: - Group verbal and written instruction to review commonly prescribed  medications for heart disease. Reviews the medication, class of the drug, and side effects. Includes the steps to properly store meds and maintain the prescription regimen.   Go Sex-Intimacy & Heart Disease, Get SMART - Goal Setting: - Group verbal and written instruction through game format to discuss heart disease and the return to sexual intimacy. Provides group verbal and written material to discuss and apply goal setting through the application of the S.M.A.R.T. Method.   Other Matters of the Heart: - Provides group verbal, written materials and models to describe Heart Failure, Angina, Valve Disease, and Diabetes in the realm of heart disease. Includes description of the disease process and treatment options available to the cardiac patient.   Exercise & Equipment Safety: - Individual verbal instruction and demonstration of equipment use and safety with use of the equipment.          Cardiac Rehab from 04/28/2016 in [facility] Cardiac and Pulmonary Rehab   Date  04/28/16   Educator  D.W.   Instruction Review Code  1- partially meets, needs review/practice      Infection Prevention: - Provides verbal and written material to individual with discussion of infection control including proper hand washing and proper equipment cleaning during exercise session.      Cardiac Rehab from 04/28/2016 in [facility] Cardiac and Pulmonary Rehab   Date  04/28/16   Educator  DW   Instruction Review Code  2- meets goals/outcomes      Falls Prevention: - Provides verbal and written material to individual with discussion of falls prevention and safety.      Cardiac Rehab from 04/28/2016 in [facility] Cardiac and Pulmonary Rehab   Date  04/28/16   Educator  DW   Instruction Review Code  2- meets goals/outcomes      Diabetes: - Individual verbal and written instruction to review signs/symptoms of diabetes, desired ranges of glucose level fasting, after meals and with exercise. Advice that pre and post exercise  glucose checks will be done for 3 sessions at entry of program.      Cardiac Rehab from 04/28/2016 in [facility] Cardiac and Pulmonary Rehab   Date  04/28/16   Educator  DW   Instruction Review Code  2- meets goals/outcomes       Knowledge Questionnaire Score:  Core Components/Risk Factors/Patient Goals at Admission:     Personal Goals and Risk Factors at Admission - 04/28/16 1508    Core Components/Risk Factors/Patient Goals on Admission    Weight Management Weight Loss;Obesity   Sedentary Yes   Intervention Provide advice, education, support and counseling about physical activity/exercise needs.;Develop an individualized exercise prescription for aerobic and resistive training based on initial evaluation findings, risk stratification, comorbidities and participant's personal goals.   Expected Outcomes Achievement of increased cardiorespiratory fitness and enhanced flexibility, muscular endurance and strength shown through measurements of functional capacity and personal statement of participant.   Increase Strength and Stamina Yes   Intervention Provide advice, education, support and counseling about physical activity/exercise needs.;Develop an individualized exercise prescription for aerobic and resistive training based on initial evaluation findings, risk stratification, comorbidities and participant's personal goals.   Expected Outcomes Achievement of increased cardiorespiratory fitness and enhanced flexibility, muscular endurance and strength shown through measurements of functional capacity and personal statement of participant.   Improve shortness of breath with ADL's Yes   Intervention Provide education, individualized exercise plan and daily activity instruction to help decrease symptoms of SOB with activities of daily living.   Expected Outcomes Short Term: Achieves a reduction of symptoms when performing activities of daily living.   Diabetes Yes   Intervention Provide education  about proper nutrition, including hydration, and aerobic/resistive exercise prescription along with prescribed medications to achieve blood glucose in normal ranges: Fasting glucose 65-99 mg/dL;Provide education about signs/symptoms and action to take for hypo/hyperglycemia.   Expected Outcomes Short Term: Participant verbalizes understanding of the signs/symptoms and immediate care of hyper/hypoglycemia, proper foot care and importance of medication, aerobic/resistive exercise and nutrition plan for blood glucose control.;Long Term: Attainment of HbA1C < 7%.   Heart Failure Yes   Intervention Provide a combined exercise and nutrition program that is supplemented with education, support and counseling about heart failure. Directed toward relieving symptoms such as shortness of breath, decreased exercise tolerance, and extremity edema.   Expected Outcomes Improve functional capacity of life;Short term: Attendance in program 2-3 days a week with increased exercise capacity. Reported lower sodium intake. Reported increased fruit and vegetable intake. Reports medication compliance.;Short term: Daily weights obtained and reported for increase. Utilizing diuretic protocols set by physician.;Long term: Adoption of self-care skills and reduction of barriers for early signs and symptoms recognition and intervention leading to self-care maintenance.   Hypertension Yes   Intervention Provide education on lifestyle modifcations including regular physical activity/exercise, weight management, moderate sodium restriction and increased consumption of fresh fruit, vegetables, and low fat dairy, alcohol moderation, and smoking cessation.;Monitor prescription use compliance.   Expected Outcomes Short Term: Continued assessment and intervention until BP is < 140/33mm HG in hypertensive participants. < 130/30mm HG in hypertensive participants with diabetes, heart failure or chronic kidney disease.;Long Term: Maintenance of blood  pressure at goal levels.   Lipids Yes   Intervention Provide education and support for participant on nutrition & aerobic/resistive exercise along with prescribed medications to achieve LDL 70mg , HDL >40mg .   Expected Outcomes Short Term: Participant states understanding of desired cholesterol values and is compliant with medications prescribed. Participant is following exercise prescription and nutrition guidelines.;Long Term: Cholesterol controlled with medications as prescribed, with individualized exercise RX and with personalized nutrition plan. Value goals: LDL < 70mg , HDL > 40 mg.   Stress Yes   Intervention Offer individual and/or small group education and counseling on adjustment to heart disease, stress management and health-related lifestyle change. Teach and  support self-help strategies.;Refer participants experiencing significant psychosocial distress to appropriate mental health specialists for further evaluation and treatment. When possible, include family members and significant others in education/counseling sessions.   Expected Outcomes Short Term: Participant demonstrates changes in health-related behavior, relaxation and other stress management skills, ability to obtain effective social support, and compliance with psychotropic medications if prescribed.;Long Term: Emotional wellbeing is indicated by absence of clinically significant psychosocial distress or social isolation.      Core Components/Risk Factors/Patient Goals Review:    Core Components/Risk Factors/Patient Goals at Discharge (Final Review):    ITP Comments:     ITP Comments      04/28/16 1706 04/28/16 1736         ITP Comments Daniel Rinks "Sam" presented today for Cardiac Rehab Orientation.  He has a long history of CAD.  S/P MI in 1990. 1194, 1997.  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  He has had one stent placed in bypass graft approximately a month or two  after CABG.  He reports taking Nitroglycerin SL when he has GERD symptoms of burning  in the middle of his chest and throat.  He only takes one per pt. and it always relieves his GERD.  Sam stated MD is aware.  He also reports taking one nitroglycerin when he has tingling in his left chest.  One nitroglycerin relieves this tingling feeling he gets from time to time.  He says if he gets GERD or tingling in his left chest he will stop what he is doing and take nitroglycerin.  The most he has taken in one day according to patient is 5 tablets.  RN will reach out to [facility] to make her aware of the Nitroglycerin use.  Wife, Dorian Pod, accompanied patient to orientation.  Ellen's cell number is:  [phone]; Home [phone]; Sam's Cell [phone].  Sam's main goal in coming to Cardiac Rehab is increase stamina; decrease SOB; weight loss.  Sam states he would like to weigh less than 200 pounds.  Currently his weight is 250.5 pounds.  Sam will start Cardiac Rehab on Monday morning June 5th at 7:30 a.m.  He will be out of town June 6th - 16th; July 9th - 14th; August 6th - 11th; September 10th - 15th.     Daniel "Sam" presented today for Cardiac Rehab Orientation.  He has a long history of CAD.  S/P MI in 1990, 1994, 1997.  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  He has had one stent placed in bypass graft approximately a month or two after CABG.  He reports taking Nitroglycerin SL when he has GERD symptoms of burning  in the middle of his chest and throat.  He only takes one per pt. and it always relieves his GERD.  Sam stated MD is aware.  He also reports taking one nitroglycerin when he has tingling in his left chest.  One nitroglycerin relieves this tingling feeling he gets from time to time.  He says if he gets GERD or tingling in his left chest he will stop what he is doing and take nitroglycerin.  The most he has taken in one day according to patient is 5  tablets.  RN will reach out to [facility] to make her aware of the Nitroglycerin use.  Wife, Dorian Pod, accompanied patient to orientation.  Ellen's  cell number is:  [phone]; Home [phone]; Sam's Cell [phone].  Sam's main goal in coming to Cardiac Rehab is increase stamina; decrease SOB; weight loss.  Sam states he would like to weigh less than 200 pounds.  Currently his weight is 250.5 pounds.  Sam will start Cardiac Rehab on Monday morning June 5th at 7:30 a.m.  He will be out of town June 6th - 16th; July 9th - 14th; August 6th - 11th; September 10th - 15th.             Comments:  Sam will start Cardiac Rehab on Monday, May 10, 2016 at 7:30 a.m. Note: Dr. Niger Reed at [facility] is patient's [place] PCP. Phone number [phone]. Fax [id number].  Dr. Kyla Balzarine at [facility] is patient's Cardiologist. Dr. Netty Starring at [facility] is patient's PCP outside [place]. Patient also has a pulmonologist that he sees at the [place]. In addition, he is seen in the HF Clinic at [facility] by [facility].

## 2016-05-10 ENCOUNTER — Encounter: Payer: PPO | Attending: Cardiology | Admitting: *Deleted

## 2016-05-10 DIAGNOSIS — D638 Anemia in other chronic diseases classified elsewhere: Secondary | ICD-10-CM | POA: Diagnosis not present

## 2016-05-10 DIAGNOSIS — I5022 Chronic systolic (congestive) heart failure: Secondary | ICD-10-CM | POA: Diagnosis not present

## 2016-05-10 DIAGNOSIS — Z125 Encounter for screening for malignant neoplasm of prostate: Secondary | ICD-10-CM | POA: Diagnosis not present

## 2016-05-10 DIAGNOSIS — I214 Non-ST elevation (NSTEMI) myocardial infarction: Secondary | ICD-10-CM | POA: Diagnosis not present

## 2016-05-10 DIAGNOSIS — E1142 Type 2 diabetes mellitus with diabetic polyneuropathy: Secondary | ICD-10-CM | POA: Diagnosis not present

## 2016-05-10 DIAGNOSIS — E1140 Type 2 diabetes mellitus with diabetic neuropathy, unspecified: Secondary | ICD-10-CM | POA: Diagnosis not present

## 2016-05-10 DIAGNOSIS — Z79899 Other long term (current) drug therapy: Secondary | ICD-10-CM | POA: Diagnosis not present

## 2016-05-10 DIAGNOSIS — N179 Acute kidney failure, unspecified: Secondary | ICD-10-CM | POA: Diagnosis not present

## 2016-05-10 DIAGNOSIS — E7800 Pure hypercholesterolemia, unspecified: Secondary | ICD-10-CM | POA: Diagnosis not present

## 2016-05-10 DIAGNOSIS — Z951 Presence of aortocoronary bypass graft: Secondary | ICD-10-CM | POA: Diagnosis not present

## 2016-05-10 NOTE — Progress Notes (Signed)
Daily Session Note  Patient Details  Name: [name] MRN: [id number] Date of Birth: 11/20/1946 Referring Provider:        Cardiac Rehab from 04/28/2016 in [facility] Cardiac and Pulmonary Rehab   Referring Provider  Delora Fuel MD      Encounter Date: 05/10/2016  Check In:     Session Check In - 05/10/16 0843    Check-In   Location [facility]-Cardiac & Pulmonary Rehab   Staff Present Alberteen Sam, MA, ACSM RCEP, Exercise Physiologist;Kelly Amedeo Plenty, BS, ACSM CEP, Exercise Physiologist;Susanne Bice, RN, BSN, CCRP   Supervising physician immediately available to respond to emergencies See telemetry face sheet for immediately available ER MD   Medication changes reported     No   Fall or balance concerns reported    No   Warm-up and Cool-down Performed on first and last piece of equipment   Resistance Training Performed No   VAD Patient? No   Pain Assessment   Currently in Pain? No/denies   Multiple Pain Sites No         Goals Met:  Exercise tolerated well No report of cardiac concerns or symptoms First full day of exercise  Goals Unmet:  Not Applicable  Comments: First full day of exercise!  Patient was oriented to gym and equipment including functions, settings, policies, and procedures.  Patient's individual exercise prescription and treatment plan were reviewed.  All starting workloads were established based on the results of the 6 minute walk test done at initial orientation visit.  The plan for exercise progression was also introduced and progression will be customized based on patient's performance and goals.    Dr. Emily Filbert is Medical Director for [place] and [facility].

## 2016-05-11 DIAGNOSIS — E1142 Type 2 diabetes mellitus with diabetic polyneuropathy: Secondary | ICD-10-CM | POA: Diagnosis not present

## 2016-05-11 DIAGNOSIS — E1149 Type 2 diabetes mellitus with other diabetic neurological complication: Secondary | ICD-10-CM | POA: Diagnosis not present

## 2016-05-12 DIAGNOSIS — I214 Non-ST elevation (NSTEMI) myocardial infarction: Secondary | ICD-10-CM

## 2016-05-12 DIAGNOSIS — I5022 Chronic systolic (congestive) heart failure: Secondary | ICD-10-CM

## 2016-05-12 NOTE — Progress Notes (Signed)
Daily Session Note  Patient Details  Name: [name] MRN: [id number] Date of Birth: Jul 22, 1946 Referring Provider:        Cardiac Rehab from 04/28/2016 in [facility] Cardiac and Pulmonary Rehab   Referring Provider  Delora Fuel MD      Encounter Date: 05/12/2016  Check In:     Session Check In - 05/12/16 0901    Check-In   Location [facility]-Cardiac & Pulmonary Rehab   Staff Present Gerlene Burdock, RN, BSN;Jessica Luan Pulling, MA, ACSM RCEP, Exercise Physiologist;Amanda Oletta Darter, BA, ACSM CEP, Exercise Physiologist   Supervising physician immediately available to respond to emergencies See telemetry face sheet for immediately available ER MD   Medication changes reported     Yes   Comments Lasix increased to 40 mg 2x day add Coreg 25 mg d/c metoprolol   Fall or balance concerns reported    No   Warm-up and Cool-down Performed on first and last piece of equipment   Resistance Training Performed Yes   VAD Patient? No   Pain Assessment   Currently in Pain? No/denies   Multiple Pain Sites No         Goals Met:  Independence with exercise equipment Exercise tolerated well No report of cardiac concerns or symptoms Strength training completed today  Goals Unmet:  Not Applicable  Comments: Pt able to follow exercise prescription today without complaint.  Will continue to monitor for progression.    Dr. Emily Filbert is Medical Director for [place] and [facility].

## 2016-05-14 ENCOUNTER — Encounter: Payer: PPO | Admitting: *Deleted

## 2016-05-14 DIAGNOSIS — I214 Non-ST elevation (NSTEMI) myocardial infarction: Secondary | ICD-10-CM | POA: Diagnosis not present

## 2016-05-14 DIAGNOSIS — I5022 Chronic systolic (congestive) heart failure: Secondary | ICD-10-CM

## 2016-05-14 NOTE — Progress Notes (Signed)
Daily Session Note  Patient Details  Name: [name] MRN: [id number] Date of Birth: 03/05/1946 Referring Provider:        Cardiac Rehab from 04/28/2016 in [facility] Cardiac and Pulmonary Rehab   Referring Provider  Delora Fuel MD      Encounter Date: 05/14/2016  Check In:     Session Check In - 05/14/16 0912    Check-In   Location [facility]-Cardiac & Pulmonary Rehab   Staff Present Alberteen Sam, MA, ACSM RCEP, Exercise Physiologist;Susanne Bice, RN, BSN, CCRP;Carroll Enterkin, RN, BSN;Other   Supervising physician immediately available to respond to emergencies See telemetry face sheet for immediately available ER MD   Medication changes reported     No   Fall or balance concerns reported    No   Warm-up and Cool-down Performed on first and last piece of equipment   Resistance Training Performed Yes   VAD Patient? No   Pain Assessment   Currently in Pain? No/denies   Multiple Pain Sites No         Goals Met:  Exercise tolerated well No report of cardiac concerns or symptoms Strength training completed today  Goals Unmet:  Not Applicable  Comments: Pt able to follow exercise prescription today without complaint.  Will continue to monitor for progression.    Dr. Emily Filbert is Medical Director for [place] and [facility].

## 2016-05-21 ENCOUNTER — Telehealth: Payer: Self-pay | Admitting: *Deleted

## 2016-05-21 NOTE — Telephone Encounter (Signed)
This encounter was created in error - please disregard.

## 2016-05-24 ENCOUNTER — Encounter: Payer: PPO | Admitting: *Deleted

## 2016-05-24 DIAGNOSIS — I5022 Chronic systolic (congestive) heart failure: Secondary | ICD-10-CM

## 2016-05-24 DIAGNOSIS — I214 Non-ST elevation (NSTEMI) myocardial infarction: Secondary | ICD-10-CM

## 2016-05-24 NOTE — Progress Notes (Signed)
Daily Session Note  Patient Details  Name: [name] MRN: [id number] Date of Birth: 1946/11/08 Referring Provider:        Cardiac Rehab from 04/28/2016 in [facility] Cardiac and Pulmonary Rehab   Referring Provider  Delora Fuel MD      Encounter Date: 05/24/2016  Check In:     Session Check In - 05/24/16 0806    Check-In   Location [facility]-Cardiac & Pulmonary Rehab   Staff Present [name], RN, BSN, Laveda Norman, BS, ACSM CEP, Exercise Physiologist;Ludmilla Mcgillis [place], [place], ACSM RCEP, Exercise Physiologist   Supervising physician immediately available to respond to emergencies See telemetry face sheet for immediately available ER MD   Medication changes reported     No   Fall or balance concerns reported    No   Warm-up and Cool-down Performed on first and last piece of equipment   Resistance Training Performed Yes   VAD Patient? No   Pain Assessment   Currently in Pain? No/denies   Multiple Pain Sites No         Goals Met:  Independence with exercise equipment Exercise tolerated well Personal goals reviewed No report of cardiac concerns or symptoms Strength training completed today  Goals Unmet:  Not Applicable  Comments: Pt able to follow exercise prescription today without complaint.  Will continue to monitor for progression.    Dr. Emily Filbert is Medical Director for [place] and [facility].

## 2016-05-26 ENCOUNTER — Encounter: Payer: Self-pay | Admitting: *Deleted

## 2016-05-26 DIAGNOSIS — I5022 Chronic systolic (congestive) heart failure: Secondary | ICD-10-CM

## 2016-05-26 DIAGNOSIS — I214 Non-ST elevation (NSTEMI) myocardial infarction: Secondary | ICD-10-CM

## 2016-05-26 NOTE — Progress Notes (Signed)
Cardiac Individual Treatment Plan  Patient Details  Name: [name] MRN: [id number] Date of Birth: 09-23-1946 Referring Provider:        Cardiac Rehab from 04/28/2016 in [facility] Cardiac and Pulmonary Rehab   Referring Provider  Delora Fuel MD      Initial Encounter Date:       Cardiac Rehab from 04/28/2016 in [facility] Cardiac and Pulmonary Rehab   Date  04/28/16   Referring Provider  Delora Fuel MD      Visit Diagnosis: NSTEMI (non-ST elevated myocardial infarction) ([place])  Chronic systolic heart failure ([place])  Patient's Home Medications on Admission:  Current outpatient prescriptions:  .  acetaminophen (TYLENOL) 325 MG tablet, Take 2 tablets (650 mg total) by mouth every 6 (six) hours as needed for mild pain (or Fever >/= 101)., Disp: , Rfl:  .  albuterol (PROVENTIL HFA;VENTOLIN HFA) 108 (90 Base) MCG/ACT inhaler, Inhale 2 puffs into the lungs 3 (three) times daily as needed for wheezing or shortness of breath., Disp: , Rfl:  .  albuterol-ipratropium (COMBIVENT) 18-103 MCG/ACT inhaler, Inhale 1 puff into the lungs 2 times daily at 12 noon and 4 pm., Disp: , Rfl:  .  aspirin 81 MG tablet, Take 81 mg by mouth every morning., Disp: , Rfl:  .  atorvastatin (LIPITOR) 40 MG tablet, Take 80 mg by mouth every evening. , Disp: , Rfl:  .  budesonide-formoterol (SYMBICORT) 80-4.5 MCG/ACT inhaler, Inhale 2 puffs into the lungs 2 (two) times daily., Disp: , Rfl:  .  clopidogrel (PLAVIX) 75 MG tablet, Take 75 mg by mouth every morning., Disp: , Rfl:  .  dexlansoprazole (DEXILANT) 60 MG capsule, Take 60 mg by mouth daily. Reported on 04/28/2016, Disp: , Rfl:  .  ferrous sulfate 325 (65 FE) MG tablet, Take 325 mg by mouth 2 (two) times daily with a meal., Disp: , Rfl:  .  finasteride (PROSCAR) 5 MG tablet, Take 5 mg by mouth at bedtime., Disp: , Rfl:  .  FLUoxetine (PROZAC) 20 MG tablet, Take 20 mg by mouth every morning., Disp: , Rfl:  .  furosemide (LASIX) 40 MG tablet, Take 40 mg by mouth  every morning., Disp: , Rfl:  .  gabapentin (NEURONTIN) 300 MG capsule, Take 600 mg by mouth 3 (three) times daily., Disp: , Rfl:  .  insulin glargine (LANTUS) 100 UNIT/ML injection, Inject 0.75 mLs (75 Units total) into the skin at bedtime. (Patient taking differently: Inject 60 Units into the skin at bedtime. ), Disp: 10 mL, Rfl: 11 .  lisinopril (PRINIVIL,ZESTRIL) 20 MG tablet, Take 20 mg by mouth daily., Disp: , Rfl:  .  loratadine (CLARITIN) 10 MG tablet, Take 10 mg by mouth daily., Disp: , Rfl:  .  metFORMIN (GLUCOPHAGE) 1000 MG tablet, Take 1,000 mg by mouth 2 (two) times daily with a meal. , Disp: , Rfl:  .  metoprolol (LOPRESSOR) 50 MG tablet, Take 50 mg by mouth 2 (two) times daily., Disp: , Rfl:  .  Multiple Vitamins-Minerals (CENTRUM SILVER PO), Take 1 tablet by mouth every morning., Disp: , Rfl:  .  nitroGLYCERIN (NITROSTAT) 0.4 MG SL tablet, Place 0.4 mg under the tongue every 5 (five) minutes as needed for chest pain., Disp: , Rfl:  .  ranolazine (RANEXA) 500 MG 12 hr tablet, Take 500 mg by mouth 2 (two) times daily., Disp: , Rfl:  .  tamsulosin (FLOMAX) 0.4 MG CAPS capsule, Take 0.4 mg by mouth daily after supper., Disp: , Rfl:  Past Medical History: Past Medical History  Diagnosis Date  . Anemia   . Cancer ([place]) 12/2013    prostate  . Shortness of breath dyspnea   . COPD (chronic obstructive pulmonary disease) ([place])   . Cardiogenic pulmonary edema ([place]) 12/19/2014  . Diabetes mellitus without complication ([place])   . Hypertension   . Hypercholesteremia   . CHF (congestive heart failure) ([place])   . Cardiomyopathy, ischemic   . GERD (gastroesophageal reflux disease)   . Myocardial infarction [facility]) [phone]    Tobacco Use: History  Smoking status  . Former Smoker -- 2.00 packs/day for 30 years  . Quit date: 12/03/1996  Smokeless tobacco  . Never Used    Labs: Recent Review Flowsheet Data    Labs for ITP Cardiac and Pulmonary Rehab Latest Ref Rng 01/05/2014  01/31/2016   Cholestrol 0-200 mg/dL 140 -   LDLCALC 0-100 mg/dL 86 -   HDL 40-60 mg/dL 33(L) -   Trlycerides 0-200 mg/dL 103 -   HCO3 21.0 - 28.0 mEq/L - 20.2(L)   ACIDBASEDEF 0.0 - 2.0 mmol/L - 4.4(H)   O2SAT - - 92.4       Exercise Target Goals:    Exercise Program Goal: Individual exercise prescription set with THRR, safety & activity barriers. Participant demonstrates ability to understand and report RPE using BORG scale, to self-measure pulse accurately, and to acknowledge the importance of the exercise prescription.  Exercise Prescription Goal: Starting with aerobic activity 30 plus minutes a day, 3 days per week for initial exercise prescription. Provide home exercise prescription and guidelines that participant acknowledges understanding prior to discharge.  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     Cardiac Risk Stratification High      6 Minute Walk:     6 Minute Walk      04/28/16 1449       6 Minute Walk   Phase Initial     Distance 890 feet     Walk Time 6 minutes     # of Rest Breaks 0     MPH 1.69     METS 1.62     RPE 17     VO2 Peak 5.67     Symptoms Yes (comment)     Comments Neuropathy, claudication, 2/10 chest tightness (possibly breathing)     Resting HR 68 bpm     Resting BP 112/60 mmHg     Max Ex. HR 110 bpm     Max Ex. BP 138/64 mmHg     Interval Oxygen   Interval Oxygen? Yes     Baseline Oxygen Saturation % 98 %     Baseline Liters of Oxygen 0 L  Room Air     1 Minute Oxygen Saturation % 98 %     1 Minute Liters of Oxygen 0 L  Room Air     3 Minute Oxygen Saturation % 95 %     3 Minute Liters of Oxygen 0 L     5 Minute Oxygen  Saturation % 91 %     5 Minute Liters of Oxygen 0 L     6 Minute Oxygen Saturation % 91 %     6 Minute Liters of Oxygen 0 L  Initial Exercise Prescription:     Initial Exercise Prescription - 04/28/16 1500    Date of Initial Exercise RX and Referring Provider   Date 04/28/16   Referring Provider Delora Fuel MD   Treadmill   MPH 1   Grade 0   Minutes 10  5/5   METs 1.7   Bike   Level 0.4   Watts 20   Minutes 15   METs 1.6   Recumbant Bike   Level 1   RPM 50   Watts 20   Minutes 15   METs 1.6   NuStep   Level 1   Watts 20   Minutes 15   METs 1.6   Arm Ergometer   Level 1   Watts 20   Minutes 15   METs 1.6   Arm/Foot Ergometer   Level 1   Watts 20   Minutes 15   METs 1.6   Cybex   Level 1   RPM 50   Minutes 15   METs 1.6   Recumbant Elliptical   Level 1   RPM 50   Watts 20   Minutes 15   METs 1.6   Elliptical   Level 1   Speed 50   Minutes 15   METs 1.7   REL-XR   Level 1   Watts 20   Minutes 15   METs 1.6   T5 Nustep   Level 1   Watts 20   Minutes 15   METs 1.6   Biostep-RELP   Level 1   Watts 20   Minutes 15   METs 1.6   Prescription Details   Frequency (times per week) 3   Duration Progress to 30 minutes of continuous aerobic without signs/symptoms of physical distress   Intensity   THRR 40-80% of Max Heartrate 100-133   Ratings of Perceived Exertion 11-13   Perceived Dyspnea 0-4   Progression   Progression Continue to progress workloads to maintain intensity without signs/symptoms of physical distress.   Resistance Training   Training Prescription Yes   Weight 2lbs   Reps 10-12      Perform Capillary Blood Glucose checks as needed.  Exercise Prescription Changes:     Exercise Prescription Changes      05/10/16 0800 05/18/16 1400         Exercise Review   Progression  Yes      Response to Exercise   Blood Pressure (Admit)  124/66 mmHg      Blood Pressure (Exercise)  134/72 mmHg      Blood Pressure  (Exit)  118/58 mmHg      Heart Rate (Admit)  86 bpm      Heart Rate (Exercise)  96 bpm      Heart Rate (Exit)  80 bpm      Rating of Perceived Exertion (Exercise)  13      Symptoms  none      Duration  Progress to 45 minutes of aerobic exercise without signs/symptoms of physical distress      Intensity  THRR unchanged      Progression   Progression  Continue to progress workloads to maintain intensity without signs/symptoms of physical distress.      Average METs  1.9      Resistance Training   Training Prescription  Yes      Weight  2lbs      Reps  10-12      Interval Training   Interval Training  No      Treadmill   MPH --       Minutes --       METs --       NuStep   Level  3      Watts  36      Minutes  40      METs  1.9      T5 Nustep   Level  3      Watts  20      Minutes  15      METs  1.9      Biostep-RELP   Level  2      Watts  25      Minutes  15      METs  2         Exercise Comments:     Exercise Comments      04/28/16 1454 05/05/16 1301 05/10/16 0844 05/18/16 1442     Exercise Comments Pt surprised himself on how far he was able to walk.  Pt would really like to focus on increasing his exercise stamina. Medical record review completed.  Pt is scheduled to start on Friday 6/2. First full day of exercise completed.  He tolerated exercise workloads well.  He did complain of shortness of breath, but no worse than his normal. Sam is off to a good start with exercise.  He really seems to enjoy using the T4r stepper.  We will encourage him to try out the treadmill some for weight bearing activity.  This week he is vacationing in [facility] and he was encouraged to walk on the beach daily!       Discharge Exercise Prescription (Final Exercise Prescription Changes):     Exercise Prescription Changes - 05/18/16 1400    Exercise Review   Progression Yes   Response to Exercise   Blood Pressure (Admit) 124/66 mmHg   Blood Pressure (Exercise) 134/72 mmHg    Blood Pressure (Exit) 118/58 mmHg   Heart Rate (Admit) 86 bpm   Heart Rate (Exercise) 96 bpm   Heart Rate (Exit) 80 bpm   Rating of Perceived Exertion (Exercise) 13   Symptoms none   Duration Progress to 45 minutes of aerobic exercise without signs/symptoms of physical distress   Intensity THRR unchanged   Progression   Progression Continue to progress workloads to maintain intensity without signs/symptoms of physical distress.   Average METs 1.9   Resistance Training   Training Prescription Yes   Weight 2lbs   Reps 10-12   Interval Training   Interval Training No   NuStep   Level 3   Watts 36   Minutes 40   METs 1.9   T5 Nustep   Level 3   Watts 20   Minutes 15   METs 1.9   Biostep-RELP   Level 2   Watts 25   Minutes 15   METs 2      Nutrition:  Target Goals: Understanding of nutrition guidelines, daily intake of sodium '1500mg'$ , cholesterol '200mg'$ , calories 30% from fat and 7% or less from saturated fats, daily to have 5 or more servings of fruits and vegetables.  Biometrics:     Pre Biometrics - 04/28/16 1507    Pre Biometrics   Height 5' 7.25" (1.708 m)   Weight 250 lb 4.8 oz (113.535 kg)   Waist Circumference 46 inches   Hip Circumference 45.5 inches   Waist to Hip Ratio 1.01 %   BMI (  Calculated) 39       Nutrition Therapy Plan and Nutrition Goals:   Nutrition Discharge: Rate Your Plate Scores:   Nutrition Goals Re-Evaluation:   Psychosocial: Target Goals: Acknowledge presence or absence of depression, maximize coping skills, provide positive support system. Participant is able to verbalize types and ability to use techniques and skills needed for reducing stress and depression.  Initial Review & Psychosocial Screening:     Initial Psych Review & Screening - 04/28/16 1512    Initial Review   Current issues with Current Depression;Current Stress Concerns;Current Anxiety/Panic   Source of Stress Concerns Chronic Illness;Poor Coping  Skills;Unable to participate in former interests or hobbies;Unable to perform yard/household activities   [place]? Yes   Concerns Inappropriate over/under dependence on family/friends   Comments Wife states since his last admission in April 2017 he has seemed insecure and does not want her out of his site.  She stated he will not even go to the shop by himself.  He is insisting that she will come with him to Cardiac Rehab.     Screening Interventions   Interventions Encouraged to exercise;Program counselor consult;Other (comment)   Comments PHQ-9 score was 14 today.  Presently on Prozac.  Upon asking patient if he had ever seen a counselor he said, "No."  Wife stated she thought it would be a good idea for him to talk with someone besides her.  Will defer to Program Counselor for evaluation and treatment.  Email sent to Program       Quality of Life Scores:     Quality of Life - 05/11/16 1550    Quality of Life Scores   Health/Function Pre 13.3 %   Socioeconomic Pre 18.57 %   Psych/Spiritual Pre 16.79 %   Family Pre 25.6 %   GLOBAL Pre 16.91 %      PHQ-9:     Recent Review Flowsheet Data    Depression screen [facility] 2/9 04/28/2016 04/05/2016 02/13/2016   Decreased Interest 3 0 0   Down, Depressed, Hopeless 3 0 0   PHQ - 2 Score 6 0 0   Altered sleeping 0 - -   Tired, decreased energy 3 - -   Change in appetite 2 - -   Feeling bad or failure about yourself  3 - -   Trouble concentrating 0 - -   Moving slowly or fidgety/restless 0 - -   Suicidal thoughts 0 - -   PHQ-9 Score 14 - -   Difficult doing work/chores Very difficult - -      Psychosocial Evaluation and Intervention:     Psychosocial Evaluation - 05/12/16 1011    Psychosocial Evaluation & Interventions   Interventions Stress management education;Encouraged to exercise with the program and follow exercise prescription   Comments Counselor met with Mr. Markhi KlecknerSam") and his spouse, Dorian Pod  today for the initial psychosocial evaluation.  Sam is a 70 year old who has had multiple cardiac issues including quadruple bypass in 2011, and most recently was in [facility] hospital for 7 days.  This is his 4th time of being in a Cardiac Rehab program.  Inocente Salles has a strong support system with a spouse of 16 years and 2 adult children who live closeby with several adult grandchildren as well.  He also has diabetes, sleep apnea and has struggled with prostate cancer in the past.  Sam reports that he sleeps well with the help of his CPAP machine.  He admits to a history of depression and some minor current symptoms, and is being treated with Prozac '20mg'$  daily for this.  Counselor spoke with Inocente Salles and Estill Bamberg about the PHQ-9 score of 14 and reviewed it with them suggesting some concern about his current mood, which Sam states is generally positive.  He admits that his limitations in his ability to do more normal activities is impacting his mood and hopes with the help of this program that will improve.  His goals are to increase his stamina and strength; improve the circulation in his legs and lose some weight while in this program.  Sam & his wife travel often and stated they will be on vacation next week.  Counselor encouraged consistency in exercise whether here or on vacation.   Counselor will be following with Sam to see if he is making progress on these goals and if his mood is improving as a result.     Continued Psychosocial Services Needed Yes  Sam will benefit from the stress management educational component of this program, as well as meeting with the dietician to address his weight loss goals.        Psychosocial Re-Evaluation:   Vocational Rehabilitation: Provide vocational rehab assistance to qualifying candidates.   Vocational Rehab Evaluation & Intervention:     Vocational Rehab - 04/28/16 1344    Initial Vocational Rehab Evaluation & Intervention   Assessment shows need for Vocational  Rehabilitation No      Education: Education Goals: Education classes will be provided on a weekly basis, covering required topics. Participant will state understanding/return demonstration of topics presented.  Learning Barriers/Preferences:     Learning Barriers/Preferences - 04/28/16 1342    Learning Barriers/Preferences   Learning Barriers Hearing;Exercise Concerns   Learning Preferences Skilled Demonstration      Education Topics: General Nutrition Guidelines/Fats and Fiber: -Group instruction provided by verbal, written material, models and posters to present the general guidelines for heart healthy nutrition. Gives an explanation and review of dietary fats and fiber.          Cardiac Rehab from 05/24/2016 in [facility] Cardiac and Pulmonary Rehab   Date  05/10/16   Educator  Jaclyn Shaggy   Instruction Review Code  2- meets goals/outcomes      Controlling Sodium/Reading Food Labels: -Group verbal and written material supporting the discussion of sodium use in heart healthy nutrition. Review and explanation with models, verbal and written materials for utilization of the food label.   Exercise Physiology & Risk Factors: - Group verbal and written instruction with models to review the exercise physiology of the cardiovascular system and associated critical values. Details cardiovascular disease risk factors and the goals associated with each risk factor.   Aerobic Exercise & Resistance Training: - Gives group verbal and written discussion on the health impact of inactivity. On the components of aerobic and resistive training programs and the benefits of this training and how to safely progress through these programs.      Cardiac Rehab from 05/24/2016 in [facility] Cardiac and Pulmonary Rehab   Date  05/24/16   Educator  [facility]   Instruction Review Code  2- meets goals/outcomes      Flexibility, Balance, General Exercise Guidelines: - Provides group verbal and written instruction on the  benefits of flexibility and balance training programs. Provides general exercise guidelines with specific guidelines to those with heart or lung disease. Demonstration and skill practice provided.   Stress Management: - Provides group verbal and written instruction  about the health risks of elevated stress, cause of high stress, and healthy ways to reduce stress.   Depression: - Provides group verbal and written instruction on the correlation between heart/lung disease and depressed mood, treatment options, and the stigmas associated with seeking treatment.   Anatomy & Physiology of the Heart: - Group verbal and written instruction and models provide basic cardiac anatomy and physiology, with the coronary electrical and arterial systems. Review of: AMI, Angina, Valve disease, Heart Failure, Cardiac Arrhythmia, Pacemakers, and the ICD.   Cardiac Procedures: - Group verbal and written instruction and models to describe the testing methods done to diagnose heart disease. Reviews the outcomes of the test results. Describes the treatment choices: Medical Management, Angioplasty, or Coronary Bypass Surgery.   Cardiac Medications: - Group verbal and written instruction to review commonly prescribed medications for heart disease. Reviews the medication, class of the drug, and side effects. Includes the steps to properly store meds and maintain the prescription regimen.   Go Sex-Intimacy & Heart Disease, Get SMART - Goal Setting: - Group verbal and written instruction through game format to discuss heart disease and the return to sexual intimacy. Provides group verbal and written material to discuss and apply goal setting through the application of the S.M.A.R.T. Method.   Other Matters of the Heart: - Provides group verbal, written materials and models to describe Heart Failure, Angina, Valve Disease, and Diabetes in the realm of heart disease. Includes description of the disease process and  treatment options available to the cardiac patient.   Exercise & Equipment Safety: - Individual verbal instruction and demonstration of equipment use and safety with use of the equipment.      Cardiac Rehab from 05/24/2016 in [facility] Cardiac and Pulmonary Rehab   Date  04/28/16   Educator  D.W.   Instruction Review Code  1- partially meets, needs review/practice      Infection Prevention: - Provides verbal and written material to individual with discussion of infection control including proper hand washing and proper equipment cleaning during exercise session.      Cardiac Rehab from 05/24/2016 in [facility] Cardiac and Pulmonary Rehab   Date  04/28/16   Educator  DW   Instruction Review Code  2- meets goals/outcomes      Falls Prevention: - Provides verbal and written material to individual with discussion of falls prevention and safety.      Cardiac Rehab from 05/24/2016 in [facility] Cardiac and Pulmonary Rehab   Date  04/28/16   Educator  DW   Instruction Review Code  2- meets goals/outcomes      Diabetes: - Individual verbal and written instruction to review signs/symptoms of diabetes, desired ranges of glucose level fasting, after meals and with exercise. Advice that pre and post exercise glucose checks will be done for 3 sessions at entry of program.      Cardiac Rehab from 05/24/2016 in [facility] Cardiac and Pulmonary Rehab   Date  04/28/16   Educator  DW   Instruction Review Code  2- meets goals/outcomes       Knowledge Questionnaire Score:     Knowledge Questionnaire Score - 05/13/16 1531    Knowledge Questionnaire Score   Pre Score 22/28      Core Components/Risk Factors/Patient Goals at Admission:     Personal Goals and Risk Factors at Admission - 04/28/16 1508    Core Components/Risk Factors/Patient Goals on Admission    Weight Management Weight Loss;Obesity   Sedentary Yes   Intervention  Provide advice, education, support and counseling about physical activity/exercise  needs.;Develop an individualized exercise prescription for aerobic and resistive training based on initial evaluation findings, risk stratification, comorbidities and participant's personal goals.   Expected Outcomes Achievement of increased cardiorespiratory fitness and enhanced flexibility, muscular endurance and strength shown through measurements of functional capacity and personal statement of participant.   Increase Strength and Stamina Yes   Intervention Provide advice, education, support and counseling about physical activity/exercise needs.;Develop an individualized exercise prescription for aerobic and resistive training based on initial evaluation findings, risk stratification, comorbidities and participant's personal goals.   Expected Outcomes Achievement of increased cardiorespiratory fitness and enhanced flexibility, muscular endurance and strength shown through measurements of functional capacity and personal statement of participant.   Improve shortness of breath with ADL's Yes   Intervention Provide education, individualized exercise plan and daily activity instruction to help decrease symptoms of SOB with activities of daily living.   Expected Outcomes Short Term: Achieves a reduction of symptoms when performing activities of daily living.   Diabetes Yes   Intervention Provide education about proper nutrition, including hydration, and aerobic/resistive exercise prescription along with prescribed medications to achieve blood glucose in normal ranges: Fasting glucose 65-99 mg/dL;Provide education about signs/symptoms and action to take for hypo/hyperglycemia.   Expected Outcomes Short Term: Participant verbalizes understanding of the signs/symptoms and immediate care of hyper/hypoglycemia, proper foot care and importance of medication, aerobic/resistive exercise and nutrition plan for blood glucose control.;Long Term: Attainment of HbA1C < 7%.   Heart Failure Yes   Intervention Provide a  combined exercise and nutrition program that is supplemented with education, support and counseling about heart failure. Directed toward relieving symptoms such as shortness of breath, decreased exercise tolerance, and extremity edema.   Expected Outcomes Improve functional capacity of life;Short term: Attendance in program 2-3 days a week with increased exercise capacity. Reported lower sodium intake. Reported increased fruit and vegetable intake. Reports medication compliance.;Short term: Daily weights obtained and reported for increase. Utilizing diuretic protocols set by physician.;Long term: Adoption of self-care skills and reduction of barriers for early signs and symptoms recognition and intervention leading to self-care maintenance.   Hypertension Yes   Intervention Provide education on lifestyle modifcations including regular physical activity/exercise, weight management, moderate sodium restriction and increased consumption of fresh fruit, vegetables, and low fat dairy, alcohol moderation, and smoking cessation.;Monitor prescription use compliance.   Expected Outcomes Short Term: Continued assessment and intervention until BP is < 140/108m HG in hypertensive participants. < 130/831mHG in hypertensive participants with diabetes, heart failure or chronic kidney disease.;Long Term: Maintenance of blood pressure at goal levels.   Lipids Yes   Intervention Provide education and support for participant on nutrition & aerobic/resistive exercise along with prescribed medications to achieve LDL '70mg'$ , HDL >'40mg'$ .   Expected Outcomes Short Term: Participant states understanding of desired cholesterol values and is compliant with medications prescribed. Participant is following exercise prescription and nutrition guidelines.;Long Term: Cholesterol controlled with medications as prescribed, with individualized exercise RX and with personalized nutrition plan. Value goals: LDL < '70mg'$ , HDL > 40 mg.   Stress Yes    Intervention Offer individual and/or small group education and counseling on adjustment to heart disease, stress management and health-related lifestyle change. Teach and support self-help strategies.;Refer participants experiencing significant psychosocial distress to appropriate mental health specialists for further evaluation and treatment. When possible, include family members and significant others in education/counseling sessions.   Expected Outcomes Short Term: Participant demonstrates changes in health-related behavior, relaxation  and other stress management skills, ability to obtain effective social support, and compliance with psychotropic medications if prescribed.;Long Term: Emotional wellbeing is indicated by absence of clinically significant psychosocial distress or social isolation.      Core Components/Risk Factors/Patient Goals Review:      Goals and Risk Factor Review      05/24/16 0955           Core Components/Risk Factors/Patient Goals Review   Personal Goals Review Weight Management/Obesity;Sedentary;Improve shortness of breath with ADL's;Increase Strength and Stamina;Heart Failure;Diabetes;Lipids;Hypertension;Stress       Review Sam has been doing great coming to rehab.  His weight is trending down.  He is doing more lifestyle activities involving walking, but no home exercsie yet.  He is feeling better stamina wise, but still no improvement in his shortness of breath.  His blood sugars and blood pressures have been impoving as well.  He is handling his medications without any concerns.  His stress levels have improved as well.       Expected Outcomes He will continue to make progress across the board wiht continued exercise and education attendance.          Core Components/Risk Factors/Patient Goals at Discharge (Final Review):      Goals and Risk Factor Review - 05/24/16 0955    Core Components/Risk Factors/Patient Goals Review   Personal Goals Review Weight  Management/Obesity;Sedentary;Improve shortness of breath with ADL's;Increase Strength and Stamina;Heart Failure;Diabetes;Lipids;Hypertension;Stress   Review Sam has been doing great coming to rehab.  His weight is trending down.  He is doing more lifestyle activities involving walking, but no home exercsie yet.  He is feeling better stamina wise, but still no improvement in his shortness of breath.  His blood sugars and blood pressures have been impoving as well.  He is handling his medications without any concerns.  His stress levels have improved as well.   Expected Outcomes He will continue to make progress across the board wiht continued exercise and education attendance.      ITP Comments:     ITP Comments      04/28/16 1706 04/28/16 1736 05/26/16 0737       ITP Comments Jeneen Rinks "Sam" presented today for Cardiac Rehab Orientation.  He has a long history of CAD.  S/P MI in 1990. 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  He has had one stent placed in bypass graft approximately a month or two after CABG.  He reports taking Nitroglycerin SL when he has GERD symptoms of burning  in the middle of his chest and throat.  He only takes one per pt. and it always relieves his GERD.  Sam stated MD is aware.  He also reports taking one nitroglycerin when he has tingling in his left chest.  One nitroglycerin relieves this tingling feeling he gets from time to time.  He says if he gets GERD or tingling in his left chest he will stop what he is doing and take nitroglycerin.  The most he has taken in one day according to patient is 5 tablets.  RN will reach out to [facility] to make her aware of the Nitroglycerin use.  Wife, Dorian Pod, accompanied patient to orientation.  Ellen's cell number is:  [phone]; Home [phone]; Sam's Cell [phone].  Sam's main goal in coming to Cardiac Rehab is increase stamina; decrease SOB; weight loss.  Sam states he  would like to weigh less than 200 pounds.  Currently his weight is 250.5 pounds.  Sam will start Cardiac Rehab on Monday morning June 5th at 7:30 a.m.  He will be out of town June 6th - 16th; July 9th - 14th; August 6th - 11th; September 10th - 15th.     Rainn "Sam" presented today for Cardiac Rehab Orientation.  He has a long history of CAD.  S/P MI in 1990, 1994, 1997.  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  He has had one stent placed in bypass graft approximately a month or two after CABG.  He reports taking Nitroglycerin SL when he has GERD symptoms of burning  in the middle of his chest and throat.  He only takes one per pt. and it always relieves his GERD.  Sam stated MD is aware.  He also reports taking one nitroglycerin when he has tingling in his left chest.  One nitroglycerin relieves this tingling feeling he gets from time to time.  He says if he gets GERD or tingling in his left chest he will stop what he is doing and take nitroglycerin.  The most he has taken in one day according to patient is 5 tablets.  RN will reach out to [facility] to make her aware of the Nitroglycerin use.  Wife, Dorian Pod, accompanied patient to orientation.  Ellen's cell number is:  [phone]; Home [phone]; Sam's Cell [phone].  Sam's main goal in coming to Cardiac Rehab is increase stamina; decrease SOB; weight loss.  Sam states he would like to weigh less than 200 pounds.  Currently his weight is 250.5 pounds.  Sam will start Cardiac Rehab on Monday morning June 5th at 7:30 a.m.  He will be out of town June 6th - 16th; July 9th - 14th; August 6th - 11th; September 10th - 15th.     30 day review.  Continue with ITP        Comments:

## 2016-05-28 DIAGNOSIS — I5022 Chronic systolic (congestive) heart failure: Secondary | ICD-10-CM

## 2016-05-28 DIAGNOSIS — I214 Non-ST elevation (NSTEMI) myocardial infarction: Secondary | ICD-10-CM | POA: Diagnosis not present

## 2016-05-28 NOTE — Progress Notes (Signed)
Daily Session Note  Patient Details  Name: [name] MRN: [id number] Date of Birth: [birth year] Referring Provider:        Cardiac Rehab from 04/28/2016 in [facility] Cardiac and Pulmonary Rehab   Referring Provider  Delora Fuel MD      Encounter Date: 05/28/2016  Check In:     Session Check In - 05/28/16 0830    Check-In   Location [facility]-Cardiac & Pulmonary Rehab   Staff Present Gerlene Burdock, RN, BSN;Mansel Strother, DPT, Everette Rank, MA, ACSM RCEP, Exercise Physiologist   Supervising physician immediately available to respond to emergencies See telemetry face sheet for immediately available ER MD   Medication changes reported     No   Fall or balance concerns reported    No   Warm-up and Cool-down Performed on first and last piece of equipment   Resistance Training Performed Yes   VAD Patient? No   Pain Assessment   Currently in Pain? No/denies   Multiple Pain Sites No         Goals Met:  Independence with exercise equipment Exercise tolerated well  Goals Unmet:  Not Applicable  Comments: Patient completed exercise prescription and all exercise goals during rehab session. The exercise was tolerated well and the patient is progressing in the program.   Dr. Emily Filbert is Medical Director for [facility] and [facility].

## 2016-05-31 ENCOUNTER — Encounter: Payer: PPO | Admitting: *Deleted

## 2016-05-31 DIAGNOSIS — I214 Non-ST elevation (NSTEMI) myocardial infarction: Secondary | ICD-10-CM | POA: Diagnosis not present

## 2016-05-31 DIAGNOSIS — I5022 Chronic systolic (congestive) heart failure: Secondary | ICD-10-CM

## 2016-05-31 NOTE — Progress Notes (Signed)
Daily Session Note  Patient Details  Name: DEANDRAE WAJDA MRN: [id number] Date of Birth: 09-Sep-1946 Referring Provider:        Cardiac Rehab from 04/28/2016 in [facility] Cardiac and Pulmonary Rehab   Referring Provider  Delora Fuel MD      Encounter Date: 05/31/2016  Check In:     Session Check In - 05/31/16 1639    Check-In   Location [facility]-Cardiac & Pulmonary Rehab   Staff Present Nyoka Cowden, RN, BSN, MA;Kylah Maresh, RN, BSN, Laveda Norman, BS, ACSM CEP, Exercise Physiologist   Supervising physician immediately available to respond to emergencies See telemetry face sheet for immediately available ER MD   Medication changes reported     No   Fall or balance concerns reported    No   Warm-up and Cool-down Performed on first and last piece of equipment   Resistance Training Performed No   VAD Patient? No   Pain Assessment   Currently in Pain? No/denies         Goals Met:  Exercise tolerated well No report of cardiac concerns or symptoms  Goals Unmet:  Not Applicable  Comments: Doing well with exercise prescription progression.    Dr. Emily Filbert is Medical Director for [place] and [facility].

## 2016-06-01 DIAGNOSIS — R05 Cough: Secondary | ICD-10-CM | POA: Diagnosis not present

## 2016-06-01 DIAGNOSIS — R509 Fever, unspecified: Secondary | ICD-10-CM | POA: Diagnosis not present

## 2016-06-07 ENCOUNTER — Encounter: Payer: PPO | Attending: Cardiology | Admitting: *Deleted

## 2016-06-07 DIAGNOSIS — I5022 Chronic systolic (congestive) heart failure: Secondary | ICD-10-CM | POA: Diagnosis not present

## 2016-06-07 DIAGNOSIS — I214 Non-ST elevation (NSTEMI) myocardial infarction: Secondary | ICD-10-CM | POA: Diagnosis not present

## 2016-06-07 NOTE — Progress Notes (Signed)
Daily Session Note  Patient Details  Name: [name] MRN: [id number] Date of Birth: 03/16/1946 Referring Provider:        Cardiac Rehab from 04/28/2016 in [facility] Cardiac and Pulmonary Rehab   Referring Provider  Delora Fuel MD      Encounter Date: 06/07/2016  Check In:     Session Check In - 06/07/16 1012    Check-In   Staff Present Nyoka Cowden, RN, BSN, MA;Cambrie Sonnenfeld, RN, BSN, Laveda Norman, BS, ACSM CEP, Exercise Physiologist   Supervising physician immediately available to respond to emergencies See telemetry face sheet for immediately available ER MD   Medication changes reported     No   Fall or balance concerns reported    No   Warm-up and Cool-down Performed on first and last piece of equipment   Resistance Training Performed Yes   VAD Patient? No   Pain Assessment   Currently in Pain? No/denies         Goals Met:  Exercise tolerated well No report of cardiac concerns or symptoms Strength training completed today  Goals Unmet:  Not Applicable  Comments: Doing well with exercise prescription progression.    Dr. Emily Filbert is Medical Director for [place] and [facility].

## 2016-06-09 DIAGNOSIS — Z95810 Presence of automatic (implantable) cardiac defibrillator: Secondary | ICD-10-CM | POA: Diagnosis not present

## 2016-06-11 ENCOUNTER — Encounter: Payer: PPO | Admitting: *Deleted

## 2016-06-11 DIAGNOSIS — I5022 Chronic systolic (congestive) heart failure: Secondary | ICD-10-CM

## 2016-06-11 DIAGNOSIS — I214 Non-ST elevation (NSTEMI) myocardial infarction: Secondary | ICD-10-CM | POA: Diagnosis not present

## 2016-06-11 NOTE — Progress Notes (Signed)
Daily Session Note  Patient Details  Name: QUIN MATHENIA MRN: [id number] Date of Birth: 12-03-1946 Referring Provider:        Cardiac Rehab from 04/28/2016 in [facility] Cardiac and Pulmonary Rehab   Referring Provider  Delora Fuel MD      Encounter Date: 06/11/2016  Check In:     Session Check In - 06/11/16 0945    Check-In   Location [facility]-Cardiac & Pulmonary Rehab   Staff Present Nyoka Cowden, RN, BSN, MA;Carroll Enterkin, RN, Levie Heritage, MA, ACSM RCEP, Exercise Physiologist   Supervising physician immediately available to respond to emergencies See telemetry face sheet for immediately available ER MD   Medication changes reported     No   Fall or balance concerns reported    No   Warm-up and Cool-down Performed on first and last piece of equipment   Resistance Training Performed Yes   VAD Patient? No   Pain Assessment   Currently in Pain? No/denies   Multiple Pain Sites No         Goals Met:  Independence with exercise equipment Exercise tolerated well No report of cardiac concerns or symptoms Strength training completed today  Goals Unmet:  Not Applicable  Comments: Pt able to follow exercise prescription today without complaint.  Will continue to monitor for progression. Tried new rotation today with 15 min stations.   Dr. Emily Filbert is Medical Director for [place] and [facility].

## 2016-06-21 ENCOUNTER — Encounter: Payer: PPO | Admitting: *Deleted

## 2016-06-21 DIAGNOSIS — I214 Non-ST elevation (NSTEMI) myocardial infarction: Secondary | ICD-10-CM | POA: Diagnosis not present

## 2016-06-21 DIAGNOSIS — I5022 Chronic systolic (congestive) heart failure: Secondary | ICD-10-CM

## 2016-06-21 NOTE — Progress Notes (Signed)
Daily Session Note  Patient Details  Name: ALEXIOS KEOWN MRN: [id number] Date of Birth: 1945-12-15 Referring Provider:        Cardiac Rehab from 04/28/2016 in [facility] Cardiac and Pulmonary Rehab   Referring Provider  Delora Fuel MD      Encounter Date: 06/21/2016  Check In:     Session Check In - 06/21/16 0830    Check-In   Location [facility]-Cardiac & Pulmonary Rehab   Staff Present [name], RN, BSN, Laveda Norman, BS, ACSM CEP, Exercise Physiologist;Jessica [place], [place], ACSM RCEP, Exercise Physiologist   Supervising physician immediately available to respond to emergencies See telemetry face sheet for immediately available ER MD   Medication changes reported     No   Fall or balance concerns reported    No   Warm-up and Cool-down Performed on first and last piece of equipment   Resistance Training Performed Yes   VAD Patient? No   Pain Assessment   Currently in Pain? No/denies   Multiple Pain Sites No         Goals Met:  Independence with exercise equipment Exercise tolerated well No report of cardiac concerns or symptoms Strength training completed today  Goals Unmet:  Not Applicable  Comments: Patient completed exercise prescription and all exercise goals during rehab session. The exercise was tolerated well and the patient is progressing in the program.     Dr. Emily Filbert is Medical Director for [place] and [facility].

## 2016-06-23 ENCOUNTER — Encounter: Payer: Self-pay | Admitting: *Deleted

## 2016-06-23 DIAGNOSIS — I214 Non-ST elevation (NSTEMI) myocardial infarction: Secondary | ICD-10-CM

## 2016-06-23 DIAGNOSIS — I5022 Chronic systolic (congestive) heart failure: Secondary | ICD-10-CM

## 2016-06-23 NOTE — Progress Notes (Signed)
Cardiac Individual Treatment Plan  Patient Details  Name: [name] MRN: [id number] Date of Birth: 02-14-1946 Referring Provider:        Cardiac Rehab from 04/28/2016 in [facility] Cardiac and Pulmonary Rehab   Referring Provider  Delora Fuel MD      Initial Encounter Date:       Cardiac Rehab from 04/28/2016 in [facility] Cardiac and Pulmonary Rehab   Date  04/28/16   Referring Provider  Delora Fuel MD      Visit Diagnosis: NSTEMI (non-ST elevated myocardial infarction) ([place])  Chronic systolic heart failure ([place])  Patient's Home Medications on Admission:  Current outpatient prescriptions:  .  acetaminophen (TYLENOL) 325 MG tablet, Take 2 tablets (650 mg total) by mouth every 6 (six) hours as needed for mild pain (or Fever >/= 101)., Disp: , Rfl:  .  albuterol (PROVENTIL HFA;VENTOLIN HFA) 108 (90 Base) MCG/ACT inhaler, Inhale 2 puffs into the lungs 3 (three) times daily as needed for wheezing or shortness of breath., Disp: , Rfl:  .  albuterol-ipratropium (COMBIVENT) 18-103 MCG/ACT inhaler, Inhale 1 puff into the lungs 2 times daily at 12 noon and 4 pm., Disp: , Rfl:  .  aspirin 81 MG tablet, Take 81 mg by mouth every morning., Disp: , Rfl:  .  atorvastatin (LIPITOR) 40 MG tablet, Take 80 mg by mouth every evening. , Disp: , Rfl:  .  budesonide-formoterol (SYMBICORT) 80-4.5 MCG/ACT inhaler, Inhale 2 puffs into the lungs 2 (two) times daily., Disp: , Rfl:  .  clopidogrel (PLAVIX) 75 MG tablet, Take 75 mg by mouth every morning., Disp: , Rfl:  .  dexlansoprazole (DEXILANT) 60 MG capsule, Take 60 mg by mouth daily. Reported on 04/28/2016, Disp: , Rfl:  .  ferrous sulfate 325 (65 FE) MG tablet, Take 325 mg by mouth 2 (two) times daily with a meal., Disp: , Rfl:  .  finasteride (PROSCAR) 5 MG tablet, Take 5 mg by mouth at bedtime., Disp: , Rfl:  .  FLUoxetine (PROZAC) 20 MG tablet, Take 20 mg by mouth every morning., Disp: , Rfl:  .  furosemide (LASIX) 40 MG tablet, Take 40 mg by mouth  every morning., Disp: , Rfl:  .  gabapentin (NEURONTIN) 300 MG capsule, Take 600 mg by mouth 3 (three) times daily., Disp: , Rfl:  .  insulin glargine (LANTUS) 100 UNIT/ML injection, Inject 0.75 mLs (75 Units total) into the skin at bedtime. (Patient taking differently: Inject 60 Units into the skin at bedtime. ), Disp: 10 mL, Rfl: 11 .  lisinopril (PRINIVIL,ZESTRIL) 20 MG tablet, Take 20 mg by mouth daily., Disp: , Rfl:  .  loratadine (CLARITIN) 10 MG tablet, Take 10 mg by mouth daily., Disp: , Rfl:  .  metFORMIN (GLUCOPHAGE) 1000 MG tablet, Take 1,000 mg by mouth 2 (two) times daily with a meal. , Disp: , Rfl:  .  metoprolol (LOPRESSOR) 50 MG tablet, Take 50 mg by mouth 2 (two) times daily., Disp: , Rfl:  .  Multiple Vitamins-Minerals (CENTRUM SILVER PO), Take 1 tablet by mouth every morning., Disp: , Rfl:  .  nitroGLYCERIN (NITROSTAT) 0.4 MG SL tablet, Place 0.4 mg under the tongue every 5 (five) minutes as needed for chest pain., Disp: , Rfl:  .  ranolazine (RANEXA) 500 MG 12 hr tablet, Take 500 mg by mouth 2 (two) times daily., Disp: , Rfl:  .  tamsulosin (FLOMAX) 0.4 MG CAPS capsule, Take 0.4 mg by mouth daily after supper., Disp: , Rfl:  Past Medical History: Past Medical History  Diagnosis Date  . Anemia   . Cancer ([place]) 12/2013    prostate  . Shortness of breath dyspnea   . COPD (chronic obstructive pulmonary disease) ([place])   . Cardiogenic pulmonary edema ([place]) 12/19/2014  . Diabetes mellitus without complication ([place])   . Hypertension   . Hypercholesteremia   . CHF (congestive heart failure) ([place])   . Cardiomyopathy, ischemic   . GERD (gastroesophageal reflux disease)   . Myocardial infarction [facility]) [phone]    Tobacco Use: History  Smoking status  . Former Smoker -- 2.00 packs/day for 30 years  . Quit date: 12/03/1996  Smokeless tobacco  . Never Used    Labs: Recent Review Flowsheet Data    Labs for ITP Cardiac and Pulmonary Rehab Latest Ref Rng 01/05/2014  01/31/2016   Cholestrol 0-200 mg/dL 140 -   LDLCALC 0-100 mg/dL 86 -   HDL 40-60 mg/dL 33(L) -   Trlycerides 0-200 mg/dL 103 -   HCO3 21.0 - 28.0 mEq/L - 20.2(L)   ACIDBASEDEF 0.0 - 2.0 mmol/L - 4.4(H)   O2SAT - - 92.4       Exercise Target Goals:    Exercise Program Goal: Individual exercise prescription set with THRR, safety & activity barriers. Participant demonstrates ability to understand and report RPE using BORG scale, to self-measure pulse accurately, and to acknowledge the importance of the exercise prescription.  Exercise Prescription Goal: Starting with aerobic activity 30 plus minutes a day, 3 days per week for initial exercise prescription. Provide home exercise prescription and guidelines that participant acknowledges understanding prior to discharge.  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     Cardiac Risk Stratification High      6 Minute Walk:     6 Minute Walk      04/28/16 1449       6 Minute Walk   Phase Initial     Distance 890 feet     Walk Time 6 minutes     # of Rest Breaks 0     MPH 1.69     METS 1.62     RPE 17     VO2 Peak 5.67     Symptoms Yes (comment)     Comments Neuropathy, claudication, 2/10 chest tightness (possibly breathing)     Resting HR 68 bpm     Resting BP 112/60 mmHg     Max Ex. HR 110 bpm     Max Ex. BP 138/64 mmHg     Interval Oxygen   Interval Oxygen? Yes     Baseline Oxygen Saturation % 98 %     Baseline Liters of Oxygen 0 L  Room Air     1 Minute Oxygen Saturation % 98 %     1 Minute Liters of Oxygen 0 L  Room Air     3 Minute Oxygen Saturation % 95 %     3 Minute Liters of Oxygen 0 L     5 Minute Oxygen  Saturation % 91 %     5 Minute Liters of Oxygen 0 L     6 Minute Oxygen Saturation % 91 %     6 Minute Liters of Oxygen 0 L  Initial Exercise Prescription:     Initial Exercise Prescription - 04/28/16 1500    Date of Initial Exercise RX and Referring Provider   Date 04/28/16   Referring Provider Delora Fuel MD   Treadmill   MPH 1   Grade 0   Minutes 10  5/5   METs 1.7   Bike   Level 0.4   Watts 20   Minutes 15   METs 1.6   Recumbant Bike   Level 1   RPM 50   Watts 20   Minutes 15   METs 1.6   NuStep   Level 1   Watts 20   Minutes 15   METs 1.6   Arm Ergometer   Level 1   Watts 20   Minutes 15   METs 1.6   Arm/Foot Ergometer   Level 1   Watts 20   Minutes 15   METs 1.6   Cybex   Level 1   RPM 50   Minutes 15   METs 1.6   Recumbant Elliptical   Level 1   RPM 50   Watts 20   Minutes 15   METs 1.6   Elliptical   Level 1   Speed 50   Minutes 15   METs 1.7   REL-XR   Level 1   Watts 20   Minutes 15   METs 1.6   T5 Nustep   Level 1   Watts 20   Minutes 15   METs 1.6   Biostep-RELP   Level 1   Watts 20   Minutes 15   METs 1.6   Prescription Details   Frequency (times per week) 3   Duration Progress to 30 minutes of continuous aerobic without signs/symptoms of physical distress   Intensity   THRR 40-80% of Max Heartrate 100-133   Ratings of Perceived Exertion 11-13   Perceived Dyspnea 0-4   Progression   Progression Continue to progress workloads to maintain intensity without signs/symptoms of physical distress.   Resistance Training   Training Prescription Yes   Weight 2lbs   Reps 10-12      Perform Capillary Blood Glucose checks as needed.  Exercise Prescription Changes:     Exercise Prescription Changes      05/10/16 0800 05/18/16 1400 06/03/16 0800 06/16/16 1600     Exercise Review   Progression  Yes Yes Yes    Response to Exercise   Blood Pressure (Admit)  124/66 mmHg 102/64 mmHg 148/68 mmHg    Blood  Pressure (Exercise)  134/72 mmHg 124/62 mmHg 142/68 mmHg    Blood Pressure (Exit)  118/58 mmHg 104/54 mmHg 122/84 mmHg    Heart Rate (Admit)  86 bpm 80 bpm 63 bpm    Heart Rate (Exercise)  96 bpm 105 bpm 119 bpm    Heart Rate (Exit)  80 bpm 86 bpm 98 bpm    Rating of Perceived Exertion (Exercise)  '13 13 13    '$ Symptoms  none none none    Duration  Progress to 45 minutes of aerobic exercise without signs/symptoms of physical distress Progress to 45 minutes of aerobic exercise without signs/symptoms of physical distress Progress to 45 minutes of aerobic exercise without signs/symptoms of physical distress    Intensity  THRR unchanged THRR unchanged THRR unchanged    Progression   Progression  Continue to progress workloads to maintain intensity without signs/symptoms of physical distress. Continue to progress workloads to maintain intensity without signs/symptoms of physical distress. Continue  to progress workloads to maintain intensity without signs/symptoms of physical distress.    Average METs  1.9  2.6    Resistance Training   Training Prescription  Yes  Yes    Weight  2lbs  2lbs    Reps  10-12  10-12    Interval Training   Interval Training  No  No    Treadmill   MPH --   1.2    Grade    0    Minutes --   15    METs --   1.92    NuStep   Level  '3 4 6    '$ Watts  36 38     Minutes  40 15 15    METs  1.9 2.4 4    T5 Nustep   Level  3 4     Watts  20 34     Minutes  15 15     METs  1.9      Biostep-RELP   Level  2  2    Watts  25      Minutes  15  15    METs  2  2       Exercise Comments:     Exercise Comments      04/28/16 1454 05/05/16 1301 05/10/16 0844 05/18/16 1442 06/11/16 0945   Exercise Comments Pt surprised himself on how far he was able to walk.  Pt would really like to focus on increasing his exercise stamina. Medical record review completed.  Pt is scheduled to start on Friday 6/2. First full day of exercise completed.  He tolerated exercise workloads well.  He  did complain of shortness of breath, but no worse than his normal. [name] is off to a good start with exercise.  He really seems to enjoy using the T4r stepper.  We will encourage him to try out the treadmill some for weight bearing activity.  This week he is vacationing in [facility] and he was encouraged to walk on the beach daily! Tried new rotation today with 15 min stations.     06/16/16 1626           Exercise Comments [name] is doing well with exercise when he is in town.  He does walk when he is out of town.  He is making some progress and we will continue to work with him on his progression.          Discharge Exercise Prescription (Final Exercise Prescription Changes):     Exercise Prescription Changes - 06/16/16 1600    Exercise Review   Progression Yes   Response to Exercise   Blood Pressure (Admit) 148/68 mmHg   Blood Pressure (Exercise) 142/68 mmHg   Blood Pressure (Exit) 122/84 mmHg   Heart Rate (Admit) 63 bpm   Heart Rate (Exercise) 119 bpm   Heart Rate (Exit) 98 bpm   Rating of Perceived Exertion (Exercise) 13   Symptoms none   Duration Progress to 45 minutes of aerobic exercise without signs/symptoms of physical distress   Intensity THRR unchanged   Progression   Progression Continue to progress workloads to maintain intensity without signs/symptoms of physical distress.   Average METs 2.6   Resistance Training   Training Prescription Yes   Weight 2lbs   Reps 10-12   Interval Training   Interval Training No   Treadmill   MPH 1.2   Grade 0   Minutes 15   METs 1.92  NuStep   Level 6   Minutes 15   METs 4   Biostep-RELP   Level 2   Minutes 15   METs 2      Nutrition:  Target Goals: Understanding of nutrition guidelines, daily intake of sodium '1500mg'$ , cholesterol '200mg'$ , calories 30% from fat and 7% or less from saturated fats, daily to have 5 or more servings of fruits and vegetables.  Biometrics:     Pre Biometrics - 04/28/16 1507    Pre  Biometrics   Height 5' 7.25" (1.708 m)   Weight 250 lb 4.8 oz (113.535 kg)   Waist Circumference 46 inches   Hip Circumference 45.5 inches   Waist to Hip Ratio 1.01 %   BMI (Calculated) 39       Nutrition Therapy Plan and Nutrition Goals:   Nutrition Discharge: Rate Your Plate Scores:   Nutrition Goals Re-Evaluation:   Psychosocial: Target Goals: Acknowledge presence or absence of depression, maximize coping skills, provide positive support system. Participant is able to verbalize types and ability to use techniques and skills needed for reducing stress and depression.  Initial Review & Psychosocial Screening:     Initial Psych Review & Screening - 04/28/16 1512    Initial Review   Current issues with Current Depression;Current Stress Concerns;Current Anxiety/Panic   Source of Stress Concerns Chronic Illness;Poor Coping Skills;Unable to participate in former interests or hobbies;Unable to perform yard/household activities   [place]? Yes   Concerns Inappropriate over/under dependence on family/friends   Comments Wife states since his last admission in April 2017 he has seemed insecure and does not want her out of his site.  She stated he will not even go to the shop by himself.  He is insisting that she will come with him to Cardiac Rehab.     Screening Interventions   Interventions Encouraged to exercise;Program counselor consult;Other (comment)   Comments PHQ-9 score was 14 today.  Presently on Prozac.  Upon asking patient if he had ever seen a counselor he said, "No."  Wife stated she thought it would be a good idea for him to talk with someone besides her.  Will defer to Program Counselor for evaluation and treatment.  Email sent to Program       Quality of Life Scores:     Quality of Life - 05/11/16 1550    Quality of Life Scores   Health/Function Pre 13.3 %   Socioeconomic Pre 18.57 %   Psych/Spiritual Pre 16.79 %   Family Pre 25.6 %    GLOBAL Pre 16.91 %      PHQ-9:     Recent Review Flowsheet Data    Depression screen [facility] 2/9 04/28/2016 04/05/2016 02/13/2016   Decreased Interest 3 0 0   Down, Depressed, Hopeless 3 0 0   PHQ - 2 Score 6 0 0   Altered sleeping 0 - -   Tired, decreased energy 3 - -   Change in appetite 2 - -   Feeling bad or failure about yourself  3 - -   Trouble concentrating 0 - -   Moving slowly or fidgety/restless 0 - -   Suicidal thoughts 0 - -   PHQ-9 Score 14 - -   Difficult doing work/chores Very difficult - -      Psychosocial Evaluation and Intervention:     Psychosocial Evaluation - 05/12/16 1011    Psychosocial Evaluation & Interventions   Interventions Stress management education;Encouraged to  exercise with the program and follow exercise prescription   Comments Counselor met with Mr. Pranshu Lyster") and his spouse, Dorian Pod today for the initial psychosocial evaluation.  [name] is a 70 year old who has had multiple cardiac issues including quadruple bypass in 2011, and most recently was in [facility] hospital for 7 days.  This is his 4th time of being in a Cardiac Rehab program.  [name] has a strong support system with a spouse of 56 years and 2 adult children who live closeby with several adult grandchildren as well.  He also has diabetes, sleep apnea and has struggled with prostate cancer in the past.  [name] reports that he sleeps well with the help of his CPAP machine.  He admits to a history of depression and some minor current symptoms, and is being treated with Prozac '20mg'$  daily for this.  Counselor spoke with [name] and Estill Bamberg about the PHQ-9 score of 14 and reviewed it with them suggesting some concern about his current mood, which [name] states is generally positive.  He admits that his limitations in his ability to do more normal activities is impacting his mood and hopes with the help of this program that will improve.  His goals are to increase his stamina and strength; improve the circulation in  his legs and lose some weight while in this program.  [name] & his wife travel often and stated they will be on vacation next week.  Counselor encouraged consistency in exercise whether here or on vacation.   Counselor will be following with [name] to see if he is making progress on these goals and if his mood is improving as a result.     Continued Psychosocial Services Needed Yes  [name] will benefit from the stress management educational component of this program, as well as meeting with the dietician to address his weight loss goals.        Psychosocial Re-Evaluation:   Vocational Rehabilitation: Provide vocational rehab assistance to qualifying candidates.   Vocational Rehab Evaluation & Intervention:     Vocational Rehab - 04/28/16 1344    Initial Vocational Rehab Evaluation & Intervention   Assessment shows need for Vocational Rehabilitation No      Education: Education Goals: Education classes will be provided on a weekly basis, covering required topics. Participant will state understanding/return demonstration of topics presented.  Learning Barriers/Preferences:     Learning Barriers/Preferences - 04/28/16 1342    Learning Barriers/Preferences   Learning Barriers Hearing;Exercise Concerns   Learning Preferences Skilled Demonstration      Education Topics: General Nutrition Guidelines/Fats and Fiber: -Group instruction provided by verbal, written material, models and posters to present the general guidelines for heart healthy nutrition. Gives an explanation and review of dietary fats and fiber.          Cardiac Rehab from 06/21/2016 in [facility] Cardiac and Pulmonary Rehab   Date  05/10/16   Educator  Jaclyn Shaggy   Instruction Review Code  2- meets goals/outcomes      Controlling Sodium/Reading Food Labels: -Group verbal and written material supporting the discussion of sodium use in heart healthy nutrition. Review and explanation with models, verbal and written materials for  utilization of the food label.   Exercise Physiology & Risk Factors: - Group verbal and written instruction with models to review the exercise physiology of the cardiovascular system and associated critical values. Details cardiovascular disease risk factors and the goals associated with each risk factor.   Aerobic Exercise & Resistance Training: -  Gives group verbal and written discussion on the health impact of inactivity. On the components of aerobic and resistive training programs and the benefits of this training and how to safely progress through these programs.      Cardiac Rehab from 06/21/2016 in [facility] Cardiac and Pulmonary Rehab   Date  05/24/16   Educator  [facility]   Instruction Review Code  2- meets goals/outcomes      Flexibility, Balance, General Exercise Guidelines: - Provides group verbal and written instruction on the benefits of flexibility and balance training programs. Provides general exercise guidelines with specific guidelines to those with heart or lung disease. Demonstration and skill practice provided.   Stress Management: - Provides group verbal and written instruction about the health risks of elevated stress, cause of high stress, and healthy ways to reduce stress.   Depression: - Provides group verbal and written instruction on the correlation between heart/lung disease and depressed mood, treatment options, and the stigmas associated with seeking treatment.   Anatomy & Physiology of the Heart: - Group verbal and written instruction and models provide basic cardiac anatomy and physiology, with the coronary electrical and arterial systems. Review of: AMI, Angina, Valve disease, Heart Failure, Cardiac Arrhythmia, Pacemakers, and the ICD.      Cardiac Rehab from 06/21/2016 in [facility] Cardiac and Pulmonary Rehab   Date  05/31/16   Educator  SB   Instruction Review Code  2- meets goals/outcomes      Cardiac Procedures: - Group verbal and written instruction and  models to describe the testing methods done to diagnose heart disease. Reviews the outcomes of the test results. Describes the treatment choices: Medical Management, Angioplasty, or Coronary Bypass Surgery.   Cardiac Medications: - Group verbal and written instruction to review commonly prescribed medications for heart disease. Reviews the medication, class of the drug, and side effects. Includes the steps to properly store meds and maintain the prescription regimen.      Cardiac Rehab from 06/21/2016 in [facility] Cardiac and Pulmonary Rehab   Date  06/21/16   Educator  SB   Instruction Review Code  2- meets goals/outcomes [cardiac meds lecture 2]      Go Sex-Intimacy & Heart Disease, Get SMART - Goal Setting: - Group verbal and written instruction through game format to discuss heart disease and the return to sexual intimacy. Provides group verbal and written material to discuss and apply goal setting through the application of the S.M.A.R.T. Method.   Other Matters of the Heart: - Provides group verbal, written materials and models to describe Heart Failure, Angina, Valve Disease, and Diabetes in the realm of heart disease. Includes description of the disease process and treatment options available to the cardiac patient.      Cardiac Rehab from 06/21/2016 in [facility] Cardiac and Pulmonary Rehab   Date  05/31/16   Educator  SB   Instruction Review Code  2- meets goals/outcomes      Exercise & Equipment Safety: - Individual verbal instruction and demonstration of equipment use and safety with use of the equipment.      Cardiac Rehab from 06/21/2016 in [facility] Cardiac and Pulmonary Rehab   Date  04/28/16   Educator  D.W.   Instruction Review Code  1- partially meets, needs review/practice      Infection Prevention: - Provides verbal and written material to individual with discussion of infection control including proper hand washing and proper equipment cleaning during exercise session.       Cardiac Rehab  from 06/21/2016 in [facility] Cardiac and Pulmonary Rehab   Date  04/28/16   Educator  DW   Instruction Review Code  2- meets goals/outcomes      Falls Prevention: - Provides verbal and written material to individual with discussion of falls prevention and safety.      Cardiac Rehab from 06/21/2016 in [facility] Cardiac and Pulmonary Rehab   Date  04/28/16   Educator  DW   Instruction Review Code  2- meets goals/outcomes      Diabetes: - Individual verbal and written instruction to review signs/symptoms of diabetes, desired ranges of glucose level fasting, after meals and with exercise. Advice that pre and post exercise glucose checks will be done for 3 sessions at entry of program.      Cardiac Rehab from 06/21/2016 in [facility] Cardiac and Pulmonary Rehab   Date  04/28/16   Educator  DW   Instruction Review Code  2- meets goals/outcomes       Knowledge Questionnaire Score:     Knowledge Questionnaire Score - 05/13/16 1531    Knowledge Questionnaire Score   Pre Score 22/28      Core Components/Risk Factors/Patient Goals at Admission:     Personal Goals and Risk Factors at Admission - 04/28/16 1508    Core Components/Risk Factors/Patient Goals on Admission    Weight Management Weight Loss;Obesity   Sedentary Yes   Intervention Provide advice, education, support and counseling about physical activity/exercise needs.;Develop an individualized exercise prescription for aerobic and resistive training based on initial evaluation findings, risk stratification, comorbidities and participant's personal goals.   Expected Outcomes Achievement of increased cardiorespiratory fitness and enhanced flexibility, muscular endurance and strength shown through measurements of functional capacity and personal statement of participant.   Increase Strength and Stamina Yes   Intervention Provide advice, education, support and counseling about physical activity/exercise needs.;Develop an  individualized exercise prescription for aerobic and resistive training based on initial evaluation findings, risk stratification, comorbidities and participant's personal goals.   Expected Outcomes Achievement of increased cardiorespiratory fitness and enhanced flexibility, muscular endurance and strength shown through measurements of functional capacity and personal statement of participant.   Improve shortness of breath with ADL's Yes   Intervention Provide education, individualized exercise plan and daily activity instruction to help decrease symptoms of SOB with activities of daily living.   Expected Outcomes Short Term: Achieves a reduction of symptoms when performing activities of daily living.   Diabetes Yes   Intervention Provide education about proper nutrition, including hydration, and aerobic/resistive exercise prescription along with prescribed medications to achieve blood glucose in normal ranges: Fasting glucose 65-99 mg/dL;Provide education about signs/symptoms and action to take for hypo/hyperglycemia.   Expected Outcomes Short Term: Participant verbalizes understanding of the signs/symptoms and immediate care of hyper/hypoglycemia, proper foot care and importance of medication, aerobic/resistive exercise and nutrition plan for blood glucose control.;Long Term: Attainment of HbA1C < 7%.   Heart Failure Yes   Intervention Provide a combined exercise and nutrition program that is supplemented with education, support and counseling about heart failure. Directed toward relieving symptoms such as shortness of breath, decreased exercise tolerance, and extremity edema.   Expected Outcomes Improve functional capacity of life;Short term: Attendance in program 2-3 days a week with increased exercise capacity. Reported lower sodium intake. Reported increased fruit and vegetable intake. Reports medication compliance.;Short term: Daily weights obtained and reported for increase. Utilizing diuretic  protocols set by physician.;Long term: Adoption of self-care skills and reduction of barriers for early signs  and symptoms recognition and intervention leading to self-care maintenance.   Hypertension Yes   Intervention Provide education on lifestyle modifcations including regular physical activity/exercise, weight management, moderate sodium restriction and increased consumption of fresh fruit, vegetables, and low fat dairy, alcohol moderation, and smoking cessation.;Monitor prescription use compliance.   Expected Outcomes Short Term: Continued assessment and intervention until BP is < 140/84m HG in hypertensive participants. < 130/860mHG in hypertensive participants with diabetes, heart failure or chronic kidney disease.;Long Term: Maintenance of blood pressure at goal levels.   Lipids Yes   Intervention Provide education and support for participant on nutrition & aerobic/resistive exercise along with prescribed medications to achieve LDL '70mg'$ , HDL >'40mg'$ .   Expected Outcomes Short Term: Participant states understanding of desired cholesterol values and is compliant with medications prescribed. Participant is following exercise prescription and nutrition guidelines.;Long Term: Cholesterol controlled with medications as prescribed, with individualized exercise RX and with personalized nutrition plan. Value goals: LDL < '70mg'$ , HDL > 40 mg.   Stress Yes   Intervention Offer individual and/or small group education and counseling on adjustment to heart disease, stress management and health-related lifestyle change. Teach and support self-help strategies.;Refer participants experiencing significant psychosocial distress to appropriate mental health specialists for further evaluation and treatment. When possible, include family members and significant others in education/counseling sessions.   Expected Outcomes Short Term: Participant demonstrates changes in health-related behavior, relaxation and other stress  management skills, ability to obtain effective social support, and compliance with psychotropic medications if prescribed.;Long Term: Emotional wellbeing is indicated by absence of clinically significant psychosocial distress or social isolation.      Core Components/Risk Factors/Patient Goals Review:      Goals and Risk Factor Review      05/24/16 0955 06/21/16 1505         Core Components/Risk Factors/Patient Goals Review   Personal Goals Review Weight Management/Obesity;Sedentary;Improve shortness of breath with ADL's;Increase Strength and Stamina;Heart Failure;Diabetes;Lipids;Hypertension;Stress Weight Management/Obesity;Sedentary;Increase Strength and Stamina;Hypertension;Lipids;Stress;Diabetes;Heart Failure;Other      Review [name] has been doing great coming to rehab.  His weight is trending down.  He is doing more lifestyle activities involving walking, but no home exercsie yet.  He is feeling better stamina wise, but still no improvement in his shortness of breath.  His blood sugars and blood pressures have been impoving as well.  He is handling his medications without any concerns.  His stress levels have improved as well. [name] has returned from a week at the beach.  He is doing well with exercise.  He did do some walking at the beach.  His weight is up some today, but overall had been trending down.  His lipids were doing better at last check.  His blood pressures are good.  He is weighing daily and watching his salt intake.  Stress is good and his shortness of breath has been improving.      Expected Outcomes He will continue to make progress across the board wiht continued exercise and education attendance. We will give [name] a heart failure zone sheet for home use.  He will continue to come to exercise and education classes.  We will continue to monitor for progress.         Core Components/Risk Factors/Patient Goals at Discharge (Final Review):      Goals and Risk Factor Review -  06/21/16 1505    Core Components/Risk Factors/Patient Goals Review   Personal Goals Review Weight Management/Obesity;Sedentary;Increase Strength and Stamina;Hypertension;Lipids;Stress;Diabetes;Heart Failure;Other   Review [name] has returned  from a week at the beach.  He is doing well with exercise.  He did do some walking at the beach.  His weight is up some today, but overall had been trending down.  His lipids were doing better at last check.  His blood pressures are good.  He is weighing daily and watching his salt intake.  Stress is good and his shortness of breath has been improving.   Expected Outcomes We will give [name] a heart failure zone sheet for home use.  He will continue to come to exercise and education classes.  We will continue to monitor for progress.      ITP Comments:     ITP Comments      04/28/16 1706 04/28/16 1736 05/26/16 0737 06/23/16 0735     ITP Comments Daniel Rinks "[name]" presented today for Cardiac Rehab Orientation.  He has a long history of CAD.  S/P MI in 1990. 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  He has had one stent placed in bypass graft approximately a month or two after CABG.  He reports taking Nitroglycerin SL when he has GERD symptoms of burning  in the middle of his chest and throat.  He only takes one per pt. and it always relieves his GERD.  [name] stated MD is aware.  He also reports taking one nitroglycerin when he has tingling in his left chest.  One nitroglycerin relieves this tingling feeling he gets from time to time.  He says if he gets GERD or tingling in his left chest he will stop what he is doing and take nitroglycerin.  The most he has taken in one day according to patient is 5 tablets.  RN will reach out to [facility] to make her aware of the Nitroglycerin use.  Wife, Dorian Pod, accompanied patient to orientation.  Ellen's cell number is:  [phone]; Home [phone]; [name]'s Cell [phone].   [name]'s main goal in coming to Cardiac Rehab is increase stamina; decrease SOB; weight loss.  [name] states he would like to weigh less than 200 pounds.  Currently his weight is 250.5 pounds.  [name] will start Cardiac Rehab on Monday morning June 5th at 7:30 a.m.  He will be out of town June 6th - 16th; July 9th - 14th; August 6th - 11th; September 10th - 15th.     Daniel "[name]" presented today for Cardiac Rehab Orientation.  He has a long history of CAD.  S/P MI in 1990, 1994, 1997.  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  He has had one stent placed in bypass graft approximately a month or two after CABG.  He reports taking Nitroglycerin SL when he has GERD symptoms of burning  in the middle of his chest and throat.  He only takes one per pt. and it always relieves his GERD.  [name] stated MD is aware.  He also reports taking one nitroglycerin when he has tingling in his left chest.  One nitroglycerin relieves this tingling feeling he gets from time to time.  He says if he gets GERD or tingling in his left chest he will stop what he is doing and take nitroglycerin.  The most he has taken in one day according to patient is 5 tablets.  RN will reach out to [facility] to make her aware of  the Nitroglycerin use.  Wife, Dorian Pod, accompanied patient to orientation.  Ellen's cell number is:  [phone]; Home [phone]; [name]'s Cell [phone].  [name]'s main goal in coming to Cardiac Rehab is increase stamina; decrease SOB; weight loss.  [name] states he would like to weigh less than 200 pounds.  Currently his weight is 250.5 pounds.  [name] will start Cardiac Rehab on Monday morning June 5th at 7:30 a.m.  He will be out of town June 6th - 16th; July 9th - 14th; August 6th - 11th; September 10th - 15th.     30 day review.  Continue with ITP 30 day review. Continue with ITP       Comments:

## 2016-06-23 NOTE — Progress Notes (Signed)
Daily Session Note  Patient Details  Name: [name] MRN: [id number] Date of Birth: 1946-10-23 Referring Provider:        Cardiac Rehab from 04/28/2016 in [facility] Cardiac and Pulmonary Rehab   Referring Provider  Delora Fuel MD      Encounter Date: 06/23/2016  Check In:     Session Check In - 06/23/16 0836    Check-In   Location [facility]-Cardiac & Pulmonary Rehab   Staff Present Alberteen Sam, MA, ACSM RCEP, Exercise Physiologist;Amanda Oletta Darter, BA, ACSM CEP, Exercise Physiologist;Carroll Enterkin, RN, BSN   Supervising physician immediately available to respond to emergencies See telemetry face sheet for immediately available ER MD   Medication changes reported     No   Fall or balance concerns reported    No   Warm-up and Cool-down Performed on first and last piece of equipment   Resistance Training Performed Yes   VAD Patient? No   Pain Assessment   Currently in Pain? No/denies   Multiple Pain Sites No         Goals Met:  Independence with exercise equipment Exercise tolerated well No report of cardiac concerns or symptoms Strength training completed today  Goals Unmet:  Not Applicable  Comments: Pt able to follow exercise prescription today without complaint.  Will continue to monitor for progression.    Dr. Emily Filbert is Medical Director for [place] and [facility].

## 2016-06-25 ENCOUNTER — Encounter: Payer: PPO | Admitting: *Deleted

## 2016-06-25 DIAGNOSIS — I214 Non-ST elevation (NSTEMI) myocardial infarction: Secondary | ICD-10-CM | POA: Diagnosis not present

## 2016-06-25 DIAGNOSIS — I5022 Chronic systolic (congestive) heart failure: Secondary | ICD-10-CM

## 2016-06-25 NOTE — Progress Notes (Signed)
Daily Session Note  Patient Details  Name: [name] MRN: [id number] Date of Birth: 01-Apr-1946 Referring Provider:        Cardiac Rehab from 04/28/2016 in [facility] Cardiac and Pulmonary Rehab   Referring Provider  Delora Fuel MD      Encounter Date: 06/25/2016  Check In:     Session Check In - 06/25/16 0901    Check-In   Location [facility]-Cardiac & Pulmonary Rehab   Staff Present Alberteen Sam, MA, ACSM RCEP, Exercise Physiologist;Amanda Oletta Darter, BA, ACSM CEP, Exercise Physiologist;Carroll Enterkin, RN, BSN   Supervising physician immediately available to respond to emergencies See telemetry face sheet for immediately available ER MD   Medication changes reported     No   Fall or balance concerns reported    No   Warm-up and Cool-down Performed on first and last piece of equipment   Resistance Training Performed Yes   VAD Patient? No   Pain Assessment   Currently in Pain? No/denies   Multiple Pain Sites No         Goals Met:  Independence with exercise equipment Exercise tolerated well No report of cardiac concerns or symptoms Strength training completed today  Goals Unmet:  Not Applicable  Comments: Pt able to follow exercise prescription today without complaint.  Will continue to monitor for progression.    Dr. Emily Filbert is Medical Director for [place] and [facility].

## 2016-06-28 ENCOUNTER — Encounter: Payer: PPO | Admitting: *Deleted

## 2016-06-28 DIAGNOSIS — I214 Non-ST elevation (NSTEMI) myocardial infarction: Secondary | ICD-10-CM

## 2016-06-28 DIAGNOSIS — I5022 Chronic systolic (congestive) heart failure: Secondary | ICD-10-CM

## 2016-06-28 NOTE — Progress Notes (Signed)
Daily Session Note  Patient Details  Name: [name] MRN: [id number] Date of Birth: 01/09/1946 Referring Provider:   Flowsheet Row Cardiac Rehab from 04/28/2016 in [facility] Cardiac and Pulmonary Rehab  Referring Provider  Delora Fuel MD      Encounter Date: 06/28/2016  Check In:     Session Check In - 06/28/16 0905      Check-In   Location [facility]-Cardiac & Pulmonary Rehab   Staff Present [name], RN, BSN, Laveda Norman, BS, ACSM CEP, Exercise Physiologist;Jessica [place], [place], ACSM RCEP, Exercise Physiologist   Supervising physician immediately available to respond to emergencies See telemetry face sheet for immediately available ER MD   Medication changes reported     No   Fall or balance concerns reported    No   Warm-up and Cool-down Performed on first and last piece of equipment   Resistance Training Performed Yes   VAD Patient? No     Pain Assessment   Currently in Pain? No/denies   Multiple Pain Sites No         Goals Met:  Independence with exercise equipment Exercise tolerated well Personal goals reviewed Strength training completed today  Goals Unmet:  Not Applicable  Comments: Patient completed exercise prescription and all exercise goals during rehab session. The exercise was tolerated well and the patient is progressing in the program.     Dr. Emily Filbert is Medical Director for [place] and [facility].

## 2016-06-30 ENCOUNTER — Encounter: Payer: PPO | Admitting: *Deleted

## 2016-06-30 DIAGNOSIS — I5022 Chronic systolic (congestive) heart failure: Secondary | ICD-10-CM

## 2016-06-30 DIAGNOSIS — I214 Non-ST elevation (NSTEMI) myocardial infarction: Secondary | ICD-10-CM | POA: Diagnosis not present

## 2016-06-30 NOTE — Progress Notes (Signed)
Daily Session Note  Patient Details  Name: [name] MRN: [id number] Date of Birth: [birth year] Referring Provider:   Flowsheet Row Cardiac Rehab from 04/28/2016 in [facility] Cardiac and Pulmonary Rehab  Referring Provider  Delora Fuel MD      Encounter Date: 06/30/2016  Check In:     Session Check In - 06/30/16 0842      Check-In   Location [facility]-Cardiac & Pulmonary Rehab   Staff Present Alberteen Sam, MA, ACSM RCEP, Exercise Physiologist;Amanda Oletta Darter, BA, ACSM CEP, Exercise Physiologist;Carroll Enterkin, RN, BSN   Supervising physician immediately available to respond to emergencies See telemetry face sheet for immediately available ER MD   Medication changes reported     No   Fall or balance concerns reported    No   Warm-up and Cool-down Performed on first and last piece of equipment   Resistance Training Performed Yes   VAD Patient? No     Pain Assessment   Currently in Pain? No/denies   Multiple Pain Sites No         Goals Met:  Independence with exercise equipment Exercise tolerated well Personal goals reviewed No report of cardiac concerns or symptoms Strength training completed today  Goals Unmet:  Not Applicable  Comments: Pt able to follow exercise prescription today without complaint.  Will continue to monitor for progression. Reviewed home exercise with pt today.  Pt plans to walk at home for exercise.  Reviewed THR, pulse, RPE, sign and symptoms, NTG use, and when to call 911 or MD.  Also discussed weather considerations and indoor options.  Pt voiced understanding. Alberteen Sam, MA, ACSM RCEP 06/30/2016 8:43 AM    Dr. Emily Filbert is Medical Director for [place] and [facility].

## 2016-07-07 ENCOUNTER — Encounter: Payer: PPO | Attending: Cardiology | Admitting: *Deleted

## 2016-07-07 VITALS — BP 78/50

## 2016-07-07 DIAGNOSIS — I10 Essential (primary) hypertension: Secondary | ICD-10-CM | POA: Diagnosis not present

## 2016-07-07 DIAGNOSIS — I25728 Atherosclerosis of autologous artery coronary artery bypass graft(s) with other forms of angina pectoris: Secondary | ICD-10-CM | POA: Diagnosis not present

## 2016-07-07 DIAGNOSIS — G4733 Obstructive sleep apnea (adult) (pediatric): Secondary | ICD-10-CM | POA: Diagnosis not present

## 2016-07-07 DIAGNOSIS — R0609 Other forms of dyspnea: Secondary | ICD-10-CM | POA: Diagnosis not present

## 2016-07-07 DIAGNOSIS — Z951 Presence of aortocoronary bypass graft: Secondary | ICD-10-CM | POA: Diagnosis not present

## 2016-07-07 DIAGNOSIS — R0989 Other specified symptoms and signs involving the circulatory and respiratory systems: Secondary | ICD-10-CM | POA: Diagnosis not present

## 2016-07-07 DIAGNOSIS — I5022 Chronic systolic (congestive) heart failure: Secondary | ICD-10-CM

## 2016-07-07 DIAGNOSIS — I214 Non-ST elevation (NSTEMI) myocardial infarction: Secondary | ICD-10-CM | POA: Diagnosis not present

## 2016-07-07 DIAGNOSIS — K219 Gastro-esophageal reflux disease without esophagitis: Secondary | ICD-10-CM | POA: Diagnosis not present

## 2016-07-07 DIAGNOSIS — E7800 Pure hypercholesterolemia, unspecified: Secondary | ICD-10-CM | POA: Diagnosis not present

## 2016-07-07 DIAGNOSIS — E1140 Type 2 diabetes mellitus with diabetic neuropathy, unspecified: Secondary | ICD-10-CM | POA: Diagnosis not present

## 2016-07-07 DIAGNOSIS — I255 Ischemic cardiomyopathy: Secondary | ICD-10-CM | POA: Diagnosis not present

## 2016-07-07 NOTE — Progress Notes (Signed)
Daily Session Note  Patient Details  Name: [name] MRN: [id number] Date of Birth: 12/23/1945 Referring Provider:   Flowsheet Row Cardiac Rehab from 04/28/2016 in [facility] Cardiac and Pulmonary Rehab  Referring Provider  Delora Fuel MD      Encounter Date: 07/07/2016  Check In:     Session Check In - 07/07/16 0855      Check-In   Location [facility]-Cardiac & Pulmonary Rehab   Staff Present Alberteen Sam, MA, ACSM RCEP, Exercise Physiologist;Auther Lyerly Oletta Darter, BA, ACSM CEP, Exercise Physiologist;Carroll Enterkin, RN, BSN   Supervising physician immediately available to respond to emergencies See telemetry face sheet for immediately available ER MD   Medication changes reported     No   Fall or balance concerns reported    Yes   Comments Sam reported he fell getting out of bed beacuse he missed putting his arm on the dresser.   Resistance Training Performed Yes   VAD Patient? No     Pain Assessment   Currently in Pain? No/denies   Multiple Pain Sites No         Goals Met:  Independence with exercise equipment Exercise tolerated well Personal goals reviewed Strength training completed today  Goals Unmet:  Not Applicable  Comments: Pt able to follow exercise prescription today without complaint.  Will continue to monitor for progression.    Dr. Emily Filbert is Medical Director for [place] and [facility].

## 2016-07-09 ENCOUNTER — Encounter: Payer: PPO | Admitting: *Deleted

## 2016-07-09 DIAGNOSIS — I214 Non-ST elevation (NSTEMI) myocardial infarction: Secondary | ICD-10-CM | POA: Diagnosis not present

## 2016-07-09 DIAGNOSIS — I5022 Chronic systolic (congestive) heart failure: Secondary | ICD-10-CM

## 2016-07-09 NOTE — Progress Notes (Signed)
Daily Session Note  Patient Details  Name: [name] MRN: [id number] Date of Birth: [birth year] Referring Provider:   Flowsheet Row Cardiac Rehab from 04/28/2016 in [facility] Cardiac and Pulmonary Rehab  Referring Provider  Delora Fuel MD      Encounter Date: 07/09/2016  Check In:     Session Check In - 07/09/16 0857      Check-In   Staff Present [name], RN, BSN, CCRP;Carroll Enterkin, RN, Levie Heritage, MA, ACSM RCEP, Exercise Physiologist   Supervising physician immediately available to respond to emergencies See telemetry face sheet for immediately available ER MD   Medication changes reported     Yes   Comments Lisinopril ans Sipronalactone both stopped   Fall or balance concerns reported    No   Warm-up and Cool-down Performed on first and last piece of equipment   Resistance Training Performed Yes   VAD Patient? No     Pain Assessment   Currently in Pain? No/denies         Goals Met:  Exercise tolerated well No report of cardiac concerns or symptoms Strength training completed today  Goals Unmet:  Not Applicable  Comments: Doing well with exercise prescription progression.  meds changed noted    Dr. Emily Filbert is Medical Director for [place] and [facility].

## 2016-07-19 ENCOUNTER — Encounter: Payer: PPO | Admitting: *Deleted

## 2016-07-19 ENCOUNTER — Ambulatory Visit: Payer: PPO | Admitting: Family

## 2016-07-19 ENCOUNTER — Encounter: Payer: Self-pay | Admitting: Family

## 2016-07-19 DIAGNOSIS — I10 Essential (primary) hypertension: Secondary | ICD-10-CM | POA: Diagnosis not present

## 2016-07-19 DIAGNOSIS — Z951 Presence of aortocoronary bypass graft: Secondary | ICD-10-CM | POA: Diagnosis not present

## 2016-07-19 DIAGNOSIS — I214 Non-ST elevation (NSTEMI) myocardial infarction: Secondary | ICD-10-CM | POA: Diagnosis not present

## 2016-07-19 DIAGNOSIS — I5022 Chronic systolic (congestive) heart failure: Secondary | ICD-10-CM | POA: Diagnosis not present

## 2016-07-19 DIAGNOSIS — I25810 Atherosclerosis of coronary artery bypass graft(s) without angina pectoris: Secondary | ICD-10-CM | POA: Diagnosis not present

## 2016-07-19 NOTE — Progress Notes (Signed)
Daily Session Note  Patient Details  Name: [name] MRN: [id number] Date of Birth: 11/16/1946 Referring Provider:   Flowsheet Row Cardiac Rehab from 04/28/2016 in [facility] Cardiac and Pulmonary Rehab  Referring Provider  Delora Fuel MD      Encounter Date: 07/19/2016  Check In:     Session Check In - 07/19/16 0759      Check-In   Location [facility]-Cardiac & Pulmonary Rehab   Staff Present Alberteen Sam, MA, ACSM RCEP, Exercise Physiologist;Kelly Amedeo Plenty, BS, ACSM CEP, Exercise Physiologist;Carroll Enterkin, RN, BSN   Supervising physician immediately available to respond to emergencies See telemetry face sheet for immediately available ER MD   Medication changes reported     No   Fall or balance concerns reported    No   Warm-up and Cool-down Performed on first and last piece of equipment   Resistance Training Performed Yes   VAD Patient? No     Pain Assessment   Currently in Pain? No/denies   Multiple Pain Sites No         Goals Met:  Independence with exercise equipment Exercise tolerated well No report of cardiac concerns or symptoms Strength training completed today  Goals Unmet:  Not Applicable  Comments: Pt able to follow exercise prescription today without complaint.  Will continue to monitor for progression.    Dr. Emily Filbert is Medical Director for [place] and [facility].

## 2016-07-21 ENCOUNTER — Encounter: Payer: Self-pay | Admitting: *Deleted

## 2016-07-21 DIAGNOSIS — I5022 Chronic systolic (congestive) heart failure: Secondary | ICD-10-CM

## 2016-07-21 DIAGNOSIS — I214 Non-ST elevation (NSTEMI) myocardial infarction: Secondary | ICD-10-CM | POA: Diagnosis not present

## 2016-07-21 NOTE — Progress Notes (Signed)
Daily Session Note  Patient Details  Name: [name] MRN: [id number] Date of Birth: 11/01/1946 Referring Provider:   Flowsheet Row Cardiac Rehab from 04/28/2016 in [facility] Cardiac and Pulmonary Rehab  Referring Provider  Delora Fuel MD      Encounter Date: 07/21/2016  Check In:     Session Check In - 07/21/16 0850      Check-In   Location [facility]-Cardiac & Pulmonary Rehab   Staff Present Alberteen Sam, MA, ACSM RCEP, Exercise Physiologist;Dhruva Orndoff Oletta Darter, BA, ACSM CEP, Exercise Physiologist;Carroll Enterkin, RN, BSN   Supervising physician immediately available to respond to emergencies See telemetry face sheet for immediately available ER MD   Medication changes reported     No   Fall or balance concerns reported    No   Warm-up and Cool-down Performed on first and last piece of equipment   Resistance Training Performed Yes   VAD Patient? No     Pain Assessment   Currently in Pain? No/denies   Multiple Pain Sites No         Goals Met:  Proper associated with RPD/PD & O2 Sat Independence with exercise equipment No report of cardiac concerns or symptoms Strength training completed today  Goals Unmet:  Not Applicable  Comments: Pt able to follow exercise prescription today without complaint.  Will continue to monitor for progression.    Dr. Emily Filbert is Medical Director for [place] and [facility].

## 2016-07-21 NOTE — Progress Notes (Signed)
Cardiac Individual Treatment Plan  Patient Details  Name: [name] MRN: [id number] Date of Birth: August 04, 1946 Referring Provider:   Flowsheet Row Cardiac Rehab from 04/28/2016 in [facility] Cardiac and Pulmonary Rehab  Referring Provider  Delora Fuel MD      Initial Encounter Date:  Flowsheet Row Cardiac Rehab from 04/28/2016 in [facility] Cardiac and Pulmonary Rehab  Date  04/28/16  Referring Provider  Delora Fuel MD      Visit Diagnosis: NSTEMI (non-ST elevated myocardial infarction) ([place])  Chronic systolic heart failure ([place])  Patient's Home Medications on Admission:  Current Outpatient Prescriptions:  .  acetaminophen (TYLENOL) 325 MG tablet, Take 2 tablets (650 mg total) by mouth every 6 (six) hours as needed for mild pain (or Fever >/= 101)., Disp: , Rfl:  .  albuterol (PROVENTIL HFA;VENTOLIN HFA) 108 (90 Base) MCG/ACT inhaler, Inhale 2 puffs into the lungs 3 (three) times daily as needed for wheezing or shortness of breath., Disp: , Rfl:  .  albuterol-ipratropium (COMBIVENT) 18-103 MCG/ACT inhaler, Inhale 1 puff into the lungs 2 times daily at 12 noon and 4 pm., Disp: , Rfl:  .  aspirin 81 MG tablet, Take 81 mg by mouth every morning., Disp: , Rfl:  .  atorvastatin (LIPITOR) 40 MG tablet, Take 80 mg by mouth every evening. , Disp: , Rfl:  .  budesonide-formoterol (SYMBICORT) 80-4.5 MCG/ACT inhaler, Inhale 2 puffs into the lungs 2 (two) times daily., Disp: , Rfl:  .  clopidogrel (PLAVIX) 75 MG tablet, Take 75 mg by mouth every morning., Disp: , Rfl:  .  dexlansoprazole (DEXILANT) 60 MG capsule, Take 60 mg by mouth daily. Reported on 04/28/2016, Disp: , Rfl:  .  ferrous sulfate 325 (65 FE) MG tablet, Take 325 mg by mouth 2 (two) times daily with a meal., Disp: , Rfl:  .  finasteride (PROSCAR) 5 MG tablet, Take 5 mg by mouth at bedtime., Disp: , Rfl:  .  FLUoxetine (PROZAC) 20 MG tablet, Take 20 mg by mouth every morning., Disp: , Rfl:  .  furosemide (LASIX) 40 MG tablet, Take 40  mg by mouth every morning., Disp: , Rfl:  .  gabapentin (NEURONTIN) 300 MG capsule, Take 600 mg by mouth 3 (three) times daily., Disp: , Rfl:  .  insulin glargine (LANTUS) 100 UNIT/ML injection, Inject 0.75 mLs (75 Units total) into the skin at bedtime. (Patient taking differently: Inject 60 Units into the skin at bedtime. ), Disp: 10 mL, Rfl: 11 .  lisinopril (PRINIVIL,ZESTRIL) 20 MG tablet, Take 20 mg by mouth daily., Disp: , Rfl:  .  loratadine (CLARITIN) 10 MG tablet, Take 10 mg by mouth daily., Disp: , Rfl:  .  metFORMIN (GLUCOPHAGE) 1000 MG tablet, Take 1,000 mg by mouth 2 (two) times daily with a meal. , Disp: , Rfl:  .  metoprolol (LOPRESSOR) 50 MG tablet, Take 50 mg by mouth 2 (two) times daily., Disp: , Rfl:  .  Multiple Vitamins-Minerals (CENTRUM SILVER PO), Take 1 tablet by mouth every morning., Disp: , Rfl:  .  nitroGLYCERIN (NITROSTAT) 0.4 MG SL tablet, Place 0.4 mg under the tongue every 5 (five) minutes as needed for chest pain., Disp: , Rfl:  .  ranolazine (RANEXA) 500 MG 12 hr tablet, Take 500 mg by mouth 2 (two) times daily., Disp: , Rfl:  .  tamsulosin (FLOMAX) 0.4 MG CAPS capsule, Take 0.4 mg by mouth daily after supper., Disp: , Rfl:   Past Medical History: Past Medical History:  Diagnosis Date  .  Anemia   . Cancer ([place]) 12/2013   prostate  . Cardiogenic pulmonary edema ([place]) 12/19/2014  . Cardiomyopathy, ischemic   . CHF (congestive heart failure) ([place])   . COPD (chronic obstructive pulmonary disease) ([place])   . Diabetes mellitus without complication ([place])   . GERD (gastroesophageal reflux disease)   . Hypercholesteremia   . Hypertension   . Myocardial infarction ([place]) [id number]  . Shortness of breath dyspnea     Tobacco Use: History  Smoking Status  . Former Smoker  . Packs/day: 2.00  . Years: 30.00  . Quit date: 12/03/1996  Smokeless Tobacco  . Never Used    Labs: Recent Review Flowsheet Data    Labs for ITP Cardiac and Pulmonary Rehab Latest Ref  Rng & Units 01/05/2014 01/31/2016   Cholestrol 0 - 200 mg/dL 140 -   LDLCALC 0 - 100 mg/dL 86 -   HDL 40 - 60 mg/dL 33(L) -   Trlycerides 0 - 200 mg/dL 103 -   HCO3 21.0 - 28.0 mEq/L - 20.2(L)   ACIDBASEDEF 0.0 - 2.0 mmol/L - 4.4(H)   O2SAT % - 92.4       Exercise Target Goals:    Exercise Program Goal: Individual exercise prescription set with THRR, safety & activity barriers. Participant demonstrates ability to understand and report RPE using BORG scale, to self-measure pulse accurately, and to acknowledge the importance of the exercise prescription.  Exercise Prescription Goal: Starting with aerobic activity 30 plus minutes a day, 3 days per week for initial exercise prescription. Provide home exercise prescription and guidelines that participant acknowledges understanding prior to discharge.  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     Cardiac Risk Stratification High      6 Minute Walk:     6 Minute Walk    Row Name 04/28/16 1449         6 Minute Walk   Phase Initial     Distance 890 feet     Walk Time 6 minutes     # of Rest Breaks 0     MPH 1.69     METS 1.62     RPE 17     VO2 Peak 5.67     Symptoms Yes (comment)     Comments Neuropathy, claudication, 2/10 chest tightness (possibly breathing)     Resting HR 68 bpm     Resting BP 112/60     Max Ex. HR 110 bpm     Max Ex. BP 138/64       Interval Oxygen   Interval Oxygen? Yes     Baseline Oxygen Saturation % 98 %     Baseline Liters of Oxygen 0 L  Room Air     1 Minute Oxygen Saturation % 98 %     1 Minute Liters of Oxygen 0 L  Room Air     3 Minute Oxygen Saturation % 95 %     3 Minute Liters of  Oxygen 0 L     5 Minute Oxygen Saturation % 91 %     5 Minute Liters of Oxygen 0 L     6 Minute Oxygen Saturation % 91 %     6 Minute Liters of Oxygen 0 L  Initial Exercise Prescription:     Initial Exercise Prescription - 04/28/16 1500      Date of Initial Exercise RX and Referring Provider   Date 04/28/16   Referring Provider Delora Fuel MD     Treadmill   MPH 1   Grade 0   Minutes 10  5/5   METs 1.7     Bike   Level 0.4   Watts 20   Minutes 15   METs 1.6     Recumbant Bike   Level 1   RPM 50   Watts 20   Minutes 15   METs 1.6     NuStep   Level 1   Watts 20   Minutes 15   METs 1.6     Arm Ergometer   Level 1   Watts 20   Minutes 15   METs 1.6     Arm/Foot Ergometer   Level 1   Watts 20   Minutes 15   METs 1.6     Cybex   Level 1   RPM 50   Minutes 15   METs 1.6     Recumbant Elliptical   Level 1   RPM 50   Watts 20   Minutes 15   METs 1.6     Elliptical   Level 1   Speed 50   Minutes 15   METs 1.7     REL-XR   Level 1   Watts 20   Minutes 15   METs 1.6     T5 Nustep   Level 1   Watts 20   Minutes 15   METs 1.6     Biostep-RELP   Level 1   Watts 20   Minutes 15   METs 1.6     Prescription Details   Frequency (times per week) 3   Duration Progress to 30 minutes of continuous aerobic without signs/symptoms of physical distress     Intensity   THRR 40-80% of Max Heartrate 100-133   Ratings of Perceived Exertion 11-13   Perceived Dyspnea 0-4     Progression   Progression Continue to progress workloads to maintain intensity without signs/symptoms of physical distress.     Resistance Training   Training Prescription Yes   Weight 2lbs   Reps 10-12      Perform Capillary Blood Glucose checks as needed.  Exercise Prescription Changes:     Exercise Prescription Changes    Row Name 05/10/16 0800 05/18/16 1400 06/03/16 0800 06/16/16 1600 06/30/16 0800     Exercise Review   Progression  - Yes Yes Yes  Yes     Response to Exercise   Blood Pressure (Admit)  - 124/66 102/64 148/68 108/52   Blood Pressure (Exercise)  - 134/72 124/62 142/68 104/42   Blood Pressure (Exit)  - 118/58 104/54 122/84 134/60   Heart Rate (Admit)  - 86 bpm 80 bpm 63 bpm 73 bpm   Heart Rate (Exercise)  - 96 bpm 105 bpm 119 bpm 116 bpm   Heart Rate (Exit)  - 80 bpm 86 bpm 98 bpm 80 bpm   Rating of Perceived Exertion (Exercise)  - _0 Symptoms  - none none none none   Comments  -  -  -  - Home Exercise Guidelines given 06/30/16   Duration  - Progress to 45 minutes of aerobic exercise without signs/symptoms of physical distress Progress to 45 minutes of aerobic exercise without signs/symptoms of  physical distress Progress to 45 minutes of aerobic exercise without signs/symptoms of physical distress Progress to 45 minutes of aerobic exercise without signs/symptoms of physical distress   Intensity  - THRR unchanged THRR unchanged THRR unchanged THRR unchanged     Progression   Progression  - Continue to progress workloads to maintain intensity without signs/symptoms of physical distress. Continue to progress workloads to maintain intensity without signs/symptoms of physical distress. Continue to progress workloads to maintain intensity without signs/symptoms of physical distress. Continue to progress workloads to maintain intensity without signs/symptoms of physical distress.   Average METs  - 1.9  - 2.6 2.13     Resistance Training   Training Prescription  - Yes  - Yes Yes   Weight  - 2lbs  - 2lbs 2lbs   Reps  - 10-12  - 10-12 10-12     Interval Training   Interval Training  - No  - No No     Treadmill   MPH -  -  - 1.2 1.2   Grade  -  -  - 0 0.5   Minutes -  -  - 15 15   METs -  -  - 1.92 2     NuStep   Level  - _0 Watts  - 36 38  -  -   Minutes  - 40 _1 METs  - 1.9 2.4 4 2.4     T5 Nustep   Level  - 3 4  -  -   Watts  - 20 34  -  -   Minutes  - 15 15  -  -   METs  - 1.9  -  -  -      Biostep-RELP   Level  - 2  - 2 2   Watts  - 25  -  -  -   Minutes  - 15  - 15 15   METs  - 2  - 2 2     Home Exercise Plan   Plans to continue exercise at  -  -  -  - Home  walking   Frequency  -  -  -  - Add 2 additional days to program exercise sessions.   [place] Name 07/15/16 1100             Exercise Review   Progression Yes         Response to Exercise   Blood Pressure (Admit) 126/64       Blood Pressure (Exercise) 112/52       Blood Pressure (Exit) 110/60       Heart Rate (Admit) 85 bpm       Heart Rate (Exercise) 104 bpm       Heart Rate (Exit) 84 bpm       Rating of Perceived Exertion (Exercise) 13       Symptoms none       Comments Home Exercise Guidelines given 06/30/16       Duration Progress to 45 minutes of aerobic exercise without signs/symptoms of physical distress       Intensity THRR unchanged         Progression   Progression Continue to progress workloads to maintain intensity without signs/symptoms of physical distress.       Average METs 2.1         Resistance Training   Training Prescription Yes  Weight 3 lbs       Reps 10-12         Interval Training   Interval Training No         Treadmill   MPH 1.2       Grade 0.5       Minutes 15       METs 2         NuStep   Level 6       Minutes 15       METs 2.3         Biostep-RELP   Level 2       Minutes 15       METs 2         Home Exercise Plan   Plans to continue exercise at Home  walking       Frequency Add 2 additional days to program exercise sessions.          Exercise Comments:     Exercise Comments    Row Name 04/28/16 1454 05/05/16 1301 05/10/16 0844 05/18/16 1442 06/11/16 0945   Exercise Comments Pt surprised himself on how far he was able to walk.  Pt would really like to focus on increasing his exercise stamina. Medical record review completed.  Pt is scheduled to start on Friday 6/2. First full day of exercise completed.  He tolerated exercise workloads well.   He did complain of shortness of breath, but no worse than his normal. Sam is off to a good start with exercise.  He really seems to enjoy using the T4r stepper.  We will encourage him to try out the treadmill some for weight bearing activity.  This week he is vacationing in [facility] and he was encouraged to walk on the beach daily! Tried new rotation today with 15 min stations.   Row Name 06/16/16 1626 06/30/16 0843 06/30/16 1101 07/15/16 1102     Exercise Comments Sammy is doing well with exercise when he is in town.  He does walk when he is out of town.  He is making some progress and we will continue to work with him on his progression. Reviewed home exercise with pt today.  Pt plans to walk at home for exercise.  Reviewed THR, pulse, RPE, sign and symptoms, NTG use, and when to call 911 or MD.  Also discussed weather considerations and indoor options.  Pt voiced understanding. Sammy is doing well with exercise.  We discussed his home exercise today.  We wil continue to work with him on progression. Sammy is doing well with exercise.  He is starting to walk at home some.   We will continue to monitor.       Discharge Exercise Prescription (Final Exercise Prescription Changes):     Exercise Prescription Changes - 07/15/16 1100      Exercise Review   Progression Yes     Response to Exercise   Blood Pressure (Admit) 126/64   Blood Pressure (Exercise) 112/52   Blood Pressure (Exit) 110/60   Heart Rate (Admit) 85 bpm   Heart Rate (Exercise) 104 bpm   Heart Rate (Exit) 84 bpm   Rating of Perceived Exertion (Exercise) 13   Symptoms none   Comments Home Exercise Guidelines given 06/30/16   Duration Progress to 45 minutes of aerobic exercise without signs/symptoms of physical distress   Intensity THRR unchanged     Progression   Progression Continue to progress workloads to maintain intensity without signs/symptoms of  physical distress.   Average METs 2.1     Resistance Training    Training Prescription Yes   Weight 3 lbs   Reps 10-12     Interval Training   Interval Training No     Treadmill   MPH 1.2   Grade 0.5   Minutes 15   METs 2     NuStep   Level 6   Minutes 15   METs 2.3     Biostep-RELP   Level 2   Minutes 15   METs 2     Home Exercise Plan   Plans to continue exercise at Home  walking   Frequency Add 2 additional days to program exercise sessions.      Nutrition:  Target Goals: Understanding of nutrition guidelines, daily intake of sodium <1517m, cholesterol <2093m calories 30% from fat and 7% or less from saturated fats, daily to have 5 or more servings of fruits and vegetables.  Biometrics:     Pre Biometrics - 04/28/16 1507      Pre Biometrics   Height 5' 7.25" (1.708 m)   Weight 250 lb 4.8 oz (113.5 kg)   Waist Circumference 46 inches   Hip Circumference 45.5 inches   Waist to Hip Ratio 1.01 %   BMI (Calculated) 39       Nutrition Therapy Plan and Nutrition Goals:   Nutrition Discharge: Rate Your Plate Scores:     Nutrition Assessments - 07/19/16 1635      Rate Your Plate Scores   Pre Score 73   Pre Score % 81 %      Nutrition Goals Re-Evaluation:     Nutrition Goals Re-Evaluation    Row Name 07/07/16 1009             Personal Goal #1 Re-Evaluation   Personal Goal #1 -  ? asked nurse if he is suppose to be on fluid restriction.           Psychosocial: Target Goals: Acknowledge presence or absence of depression, maximize coping skills, provide positive support system. Participant is able to verbalize types and ability to use techniques and skills needed for reducing stress and depression.  Initial Review & Psychosocial Screening:     Initial Psych Review & Screening - 04/28/16 1512      Initial Review   Current issues with Current Depression;Current Stress Concerns;Current Anxiety/Panic   Source of Stress Concerns Chronic Illness;Poor Coping Skills;Unable to participate in former interests  or hobbies;Unable to perform yard/household activities     FaOketoYes   Concerns Inappropriate over/under dependence on family/friends   Comments Wife states since his last admission in April 2017 he has seemed insecure and does not want her out of his site.  She stated he will not even go to the shop by himself.  He is insisting that she will come with him to Cardiac Rehab.       Screening Interventions   Interventions Encouraged to exercise;Program counselor consult;Other (comment)   Comments PHQ-9 score was 14 today.  Presently on Prozac.  Upon asking patient if he had ever seen a counselor he said, "No."  Wife stated she thought it would be a good idea for him to talk with someone besides her.  Will defer to Program Counselor for evaluation and treatment.  Email sent to Program       Quality of Life Scores:     Quality of Life - 05/11/16 1550  Quality of Life Scores   Health/Function Pre 13.3 %   Socioeconomic Pre 18.57 %   Psych/Spiritual Pre 16.79 %   Family Pre 25.6 %   GLOBAL Pre 16.91 %      PHQ-9: Recent Review Flowsheet Data    Depression screen [facility] 2/9 04/28/2016 04/05/2016 02/13/2016   Decreased Interest 3 0 0   Down, Depressed, Hopeless 3 0 0   PHQ - 2 Score 6 0 0   Altered sleeping 0 - -   Tired, decreased energy 3 - -   Change in appetite 2 - -   Feeling bad or failure about yourself  3 - -   Trouble concentrating 0 - -   Moving slowly or fidgety/restless 0 - -   Suicidal thoughts 0 - -   PHQ-9 Score 14 - -   Difficult doing work/chores Very difficult - -      Psychosocial Evaluation and Intervention:     Psychosocial Evaluation - 05/12/16 1011      Psychosocial Evaluation & Interventions   Interventions Stress management education;Encouraged to exercise with the program and follow exercise prescription   Comments Counselor met with Mr. Jahzion BrogdenSam") and his spouse, Dorian Pod today for the initial psychosocial  evaluation.  Sam is a 70 year old who has had multiple cardiac issues including quadruple bypass in 2011, and most recently was in [facility] hospital for 7 days.  This is his 4th time of being in a Cardiac Rehab program.  Inocente Salles has a strong support system with a spouse of 89 years and 2 adult children who live closeby with several adult grandchildren as well.  He also has diabetes, sleep apnea and has struggled with prostate cancer in the past.  Sam reports that he sleeps well with the help of his CPAP machine.  He admits to a history of depression and some minor current symptoms, and is being treated with Prozac 13m daily for this.  Counselor spoke with SInocente Sallesand EEstill Bambergabout the PHQ-9 score of 14 and reviewed it with them suggesting some concern about his current mood, which Sam states is generally positive.  He admits that his limitations in his ability to do more normal activities is impacting his mood and hopes with the help of this program that will improve.  His goals are to increase his stamina and strength; improve the circulation in his legs and lose some weight while in this program.  Sam & his wife travel often and stated they will be on vacation next week.  Counselor encouraged consistency in exercise whether here or on vacation.   Counselor will be following with Sam to see if he is making progress on these goals and if his mood is improving as a result.     Continued Psychosocial Services Needed Yes  Sam will benefit from the stress management educational component of this program, as well as meeting with the dietician to address his weight loss goals.        Psychosocial Re-Evaluation:     Psychosocial Re-Evaluation    RWells BranchName 07/07/16 1008             Psychosocial Re-Evaluation   Interventions Encouraged to attend Cardiac Rehabilitation for the exercise       Comments JMarkeithwife is very knowledgable about his medical problems and she is very supportive. Sammy  has c/o to his wife about  being dizzy when he stands up this week.  Vocational Rehabilitation: Provide vocational rehab assistance to qualifying candidates.   Vocational Rehab Evaluation & Intervention:     Vocational Rehab - 04/28/16 1344      Initial Vocational Rehab Evaluation & Intervention   Assessment shows need for Vocational Rehabilitation No      Education: Education Goals: Education classes will be provided on a weekly basis, covering required topics. Participant will state understanding/return demonstration of topics presented.  Learning Barriers/Preferences:     Learning Barriers/Preferences - 04/28/16 1342      Learning Barriers/Preferences   Learning Barriers Hearing;Exercise Concerns   Learning Preferences Skilled Demonstration      Education Topics: General Nutrition Guidelines/Fats and Fiber: -Group instruction provided by verbal, written material, models and posters to present the general guidelines for heart healthy nutrition. Gives an explanation and review of dietary fats and fiber. Flowsheet Row Cardiac Rehab from 07/19/2016 in [facility] Cardiac and Pulmonary Rehab  Date  06/28/16 Marcelina Morel attended 05/10/16]  Educator  CR  Instruction Review Code  2- meets goals/outcomes      Controlling Sodium/Reading Food Labels: -Group verbal and written material supporting the discussion of sodium use in heart healthy nutrition. Review and explanation with models, verbal and written materials for utilization of the food label.   Exercise Physiology & Risk Factors: - Group verbal and written instruction with models to review the exercise physiology of the cardiovascular system and associated critical values. Details cardiovascular disease risk factors and the goals associated with each risk factor.   Aerobic Exercise & Resistance Training: - Gives group verbal and written discussion on the health impact of inactivity. On the components of aerobic and resistive training programs and the  benefits of this training and how to safely progress through these programs. Flowsheet Row Cardiac Rehab from 07/19/2016 in [facility] Cardiac and Pulmonary Rehab  Date  05/24/16  Educator  [facility]  Instruction Review Code  2- meets goals/outcomes      Flexibility, Balance, General Exercise Guidelines: - Provides group verbal and written instruction on the benefits of flexibility and balance training programs. Provides general exercise guidelines with specific guidelines to those with heart or lung disease. Demonstration and skill practice provided. Flowsheet Row Cardiac Rehab from 07/19/2016 in [facility] Cardiac and Pulmonary Rehab  Date  07/19/16  Educator  [facility]  Instruction Review Code  2- meets goals/outcomes      Stress Management: - Provides group verbal and written instruction about the health risks of elevated stress, cause of high stress, and healthy ways to reduce stress.   Depression: - Provides group verbal and written instruction on the correlation between heart/lung disease and depressed mood, treatment options, and the stigmas associated with seeking treatment. Flowsheet Row Cardiac Rehab from 07/19/2016 in [facility] Cardiac and Pulmonary Rehab  Date  06/30/16  Educator  [facility]  Instruction Review Code  2- meets goals/outcomes      Anatomy & Physiology of the Heart: - Group verbal and written instruction and models provide basic cardiac anatomy and physiology, with the coronary electrical and arterial systems. Review of: AMI, Angina, Valve disease, Heart Failure, Cardiac Arrhythmia, Pacemakers, and the ICD. Flowsheet Row Cardiac Rehab from 07/19/2016 in [facility] Cardiac and Pulmonary Rehab  Date  05/31/16  Educator  SB  Instruction Review Code  2- meets goals/outcomes      Cardiac Procedures: - Group verbal and written instruction and models to describe the testing methods done to diagnose heart disease. Reviews the outcomes of the test results. Describes the treatment choices: Medical  Management, Angioplasty, or Coronary Bypass Surgery.   Cardiac Medications: - Group verbal and written instruction to review commonly prescribed medications for heart disease. Reviews the medication, class of the drug, and side effects. Includes the steps to properly store meds and maintain the prescription regimen. Flowsheet Row Cardiac Rehab from 07/19/2016 in [facility] Cardiac and Pulmonary Rehab  Date  06/21/16  Educator  SB  Instruction Review Code  2- meets goals/outcomes [cardiac meds lecture 2]      Go Sex-Intimacy & Heart Disease, Get SMART - Goal Setting: - Group verbal and written instruction through game format to discuss heart disease and the return to sexual intimacy. Provides group verbal and written material to discuss and apply goal setting through the application of the S.M.A.R.T. Method.   Other Matters of the Heart: - Provides group verbal, written materials and models to describe Heart Failure, Angina, Valve Disease, and Diabetes in the realm of heart disease. Includes description of the disease process and treatment options available to the cardiac patient. Flowsheet Row Cardiac Rehab from 07/19/2016 in [facility] Cardiac and Pulmonary Rehab  Date  05/31/16  Educator  SB  Instruction Review Code  2- meets goals/outcomes      Exercise & Equipment Safety: - Individual verbal instruction and demonstration of equipment use and safety with use of the equipment. Flowsheet Row Cardiac Rehab from 07/19/2016 in [facility] Cardiac and Pulmonary Rehab  Date  04/28/16  Educator  D.W.  Instruction Review Code  1- partially meets, needs review/practice      Infection Prevention: - Provides verbal and written material to individual with discussion of infection control including proper hand washing and proper equipment cleaning during exercise session. Flowsheet Row Cardiac Rehab from 07/19/2016 in [facility] Cardiac and Pulmonary Rehab  Date  04/28/16  Educator  DW  Instruction Review Code  2-  meets goals/outcomes      Falls Prevention: - Provides verbal and written material to individual with discussion of falls prevention and safety. Flowsheet Row Cardiac Rehab from 07/19/2016 in [facility] Cardiac and Pulmonary Rehab  Date  04/28/16  Educator  DW  Instruction Review Code  2- meets goals/outcomes      Diabetes: - Individual verbal and written instruction to review signs/symptoms of diabetes, desired ranges of glucose level fasting, after meals and with exercise. Advice that pre and post exercise glucose checks will be done for 3 sessions at entry of program. Flowsheet Row Cardiac Rehab from 07/19/2016 in [facility] Cardiac and Pulmonary Rehab  Date  04/28/16  Educator  DW  Instruction Review Code  2- meets goals/outcomes       Knowledge Questionnaire Score:     Knowledge Questionnaire Score - 05/13/16 1531      Knowledge Questionnaire Score   Pre Score 22/28      Core Components/Risk Factors/Patient Goals at Admission:     Personal Goals and Risk Factors at Admission - 04/28/16 1508      Core Components/Risk Factors/Patient Goals on Admission    Weight Management Weight Loss;Obesity   Sedentary Yes   Intervention Provide advice, education, support and counseling about physical activity/exercise needs.;Develop an individualized exercise prescription for aerobic and resistive training based on initial evaluation findings, risk stratification, comorbidities and participant's personal goals.   Expected Outcomes Achievement of increased cardiorespiratory fitness and enhanced flexibility, muscular endurance and strength shown through measurements of functional capacity and personal statement of participant.   Increase Strength and Stamina Yes   Intervention Provide advice, education, support and counseling about physical  activity/exercise needs.;Develop an individualized exercise prescription for aerobic and resistive training based on initial evaluation findings, risk  stratification, comorbidities and participant's personal goals.   Expected Outcomes Achievement of increased cardiorespiratory fitness and enhanced flexibility, muscular endurance and strength shown through measurements of functional capacity and personal statement of participant.   Improve shortness of breath with ADL's Yes   Intervention Provide education, individualized exercise plan and daily activity instruction to help decrease symptoms of SOB with activities of daily living.   Expected Outcomes Short Term: Achieves a reduction of symptoms when performing activities of daily living.   Diabetes Yes   Intervention Provide education about proper nutrition, including hydration, and aerobic/resistive exercise prescription along with prescribed medications to achieve blood glucose in normal ranges: Fasting glucose 65-99 mg/dL;Provide education about signs/symptoms and action to take for hypo/hyperglycemia.   Expected Outcomes Short Term: Participant verbalizes understanding of the signs/symptoms and immediate care of hyper/hypoglycemia, proper foot care and importance of medication, aerobic/resistive exercise and nutrition plan for blood glucose control.;Long Term: Attainment of HbA1C < 7%.   Heart Failure Yes   Intervention Provide a combined exercise and nutrition program that is supplemented with education, support and counseling about heart failure. Directed toward relieving symptoms such as shortness of breath, decreased exercise tolerance, and extremity edema.   Expected Outcomes Improve functional capacity of life;Short term: Attendance in program 2-3 days a week with increased exercise capacity. Reported lower sodium intake. Reported increased fruit and vegetable intake. Reports medication compliance.;Short term: Daily weights obtained and reported for increase. Utilizing diuretic protocols set by physician.;Long term: Adoption of self-care skills and reduction of barriers for early signs and  symptoms recognition and intervention leading to self-care maintenance.   Hypertension Yes   Intervention Provide education on lifestyle modifcations including regular physical activity/exercise, weight management, moderate sodium restriction and increased consumption of fresh fruit, vegetables, and low fat dairy, alcohol moderation, and smoking cessation.;Monitor prescription use compliance.   Expected Outcomes Short Term: Continued assessment and intervention until BP is < 140/60m HG in hypertensive participants. < 130/879mHG in hypertensive participants with diabetes, heart failure or chronic kidney disease.;Long Term: Maintenance of blood pressure at goal levels.   Lipids Yes   Intervention Provide education and support for participant on nutrition & aerobic/resistive exercise along with prescribed medications to achieve LDL <7059mHDL >17m102m Expected Outcomes Short Term: Participant states understanding of desired cholesterol values and is compliant with medications prescribed. Participant is following exercise prescription and nutrition guidelines.;Long Term: Cholesterol controlled with medications as prescribed, with individualized exercise RX and with personalized nutrition plan. Value goals: LDL < 70mg25mL > 40 mg.   Stress Yes   Intervention Offer individual and/or small group education and counseling on adjustment to heart disease, stress management and health-related lifestyle change. Teach and support self-help strategies.;Refer participants experiencing significant psychosocial distress to appropriate mental health specialists for further evaluation and treatment. When possible, include family members and significant others in education/counseling sessions.   Expected Outcomes Short Term: Participant demonstrates changes in health-related behavior, relaxation and other stress management skills, ability to obtain effective social support, and compliance with psychotropic medications if  prescribed.;Long Term: Emotional wellbeing is indicated by absence of clinically significant psychosocial distress or social isolation.      Core Components/Risk Factors/Patient Goals Review:      Goals and Risk Factor Review    Row Name 05/24/16 0955 06/21/16 1505 06/30/16 0843 07/07/16 1003 07/07/16 1014     Core Components/Risk Factors/Patient Goals  Review   Personal Goals Review Weight Management/Obesity;Sedentary;Improve shortness of breath with ADL's;Increase Strength and Stamina;Heart Failure;Diabetes;Lipids;Hypertension;Stress Weight Management/Obesity;Sedentary;Increase Strength and Stamina;Hypertension;Lipids;Stress;Diabetes;Heart Failure;Other Heart Failure  - Weight Management/Obesity;Sedentary;Increase Strength and Stamina;Diabetes;Heart Failure;Hypertension;Lipids;Stress   Review Sam has been doing great coming to rehab.  His weight is trending down.  He is doing more lifestyle activities involving walking, but no home exercsie yet.  He is feeling better stamina wise, but still no improvement in his shortness of breath.  His blood sugars and blood pressures have been impoving as well.  He is handling his medications without any concerns.  His stress levels have improved as well. Sammy has returned from a week at the beach.  He is doing well with exercise.  He did do some walking at the beach.  His weight is up some today, but overall had been trending down.  His lipids were doing better at last check.  His blood pressures are good.  He is weighing daily and watching his salt intake.  Stress is good and his shortness of breath has been improving. Sam was able to state the rules of heart failure management today.  He weighs daily and watches his salt intake.  He knows when to call MD or 911. I explained and wrote out Sammy's blood pressures today 78/50 with dizziness after exercises I explained that sometimes with extra water fluid it will teter toter his heart/blood pressure if I give him  too much water.  Sammy is doing well.  His weight is back up today, but he is weighing daily.  His blood sugars have been in the 130s.  His blood pressure has been good here, but he is not taking it at home.  His stress level is up some as he just found out that his son had a stress test and needs to see a cardiologist.  He is doing some walking on his off days.   Expected Outcomes He will continue to make progress across the board wiht continued exercise and education attendance. We will give Sammy a heart failure zone sheet for home use.  He will continue to come to exercise and education classes.  We will continue to monitor for progress. Sam is able to stay on top of his heart failure and prevent exarcerbations. No dizziness.  Sammy will continue to come to exercise and education classes.  He will continue to monitor his weight daily.      Core Components/Risk Factors/Patient Goals at Discharge (Final Review):      Goals and Risk Factor Review - 07/07/16 1014      Core Components/Risk Factors/Patient Goals Review   Personal Goals Review Weight Management/Obesity;Sedentary;Increase Strength and Stamina;Diabetes;Heart Failure;Hypertension;Lipids;Stress   Review Sammy is doing well.  His weight is back up today, but he is weighing daily.  His blood sugars have been in the 130s.  His blood pressure has been good here, but he is not taking it at home.  His stress level is up some as he just found out that his son had a stress test and needs to see a cardiologist.  He is doing some walking on his off days.   Expected Outcomes Sammy will continue to come to exercise and education classes.  He will continue to monitor his weight daily.      ITP Comments:     ITP Comments    Row Name 04/28/16 1706 04/28/16 1736 05/26/16 0737 06/23/16 0735 07/07/16 0958   ITP Comments Fayrene Fearing "Sam" presented today for  Cardiac Rehab Orientation.  He has a long history of CAD.  S/P MI in 1990. 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  He has had one stent placed in bypass graft approximately a month or two after CABG.  He reports taking Nitroglycerin SL when he has GERD symptoms of burning  in the middle of his chest and throat.  He only takes one per pt. and it always relieves his GERD.  Sam stated MD is aware.  He also reports taking one nitroglycerin when he has tingling in his left chest.  One nitroglycerin relieves this tingling feeling he gets from time to time.  He says if he gets GERD or tingling in his left chest he will stop what he is doing and take nitroglycerin.  The most he has taken in one day according to patient is 5 tablets.  RN will reach out to [facility] to make her aware of the Nitroglycerin use.  Wife, Dorian Pod, accompanied patient to orientation.  Ellen's cell number is:  [phone]; Home [phone]; Sam's Cell [phone].  Sam's main goal in coming to Cardiac Rehab is increase stamina; decrease SOB; weight loss.  Sam states he would like to weigh less than 200 pounds.  Currently his weight is 250.5 pounds.  Sam will start Cardiac Rehab on Monday morning June 5th at 7:30 a.m.  He will be out of town June 6th - 16th; July 9th - 14th; August 6th - 11th; September 10th - 15th.     Jojo "Sam" presented today for Cardiac Rehab Orientation.  He has a long history of CAD.  S/P MI in 1990, 1994, 1997.  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  He has had one stent placed in bypass graft approximately a month or two after CABG.  He reports taking Nitroglycerin SL when he has GERD symptoms of burning  in the middle of his chest and throat.  He only takes one per pt. and it always relieves his GERD.  Sam stated MD is aware.  He also reports taking one nitroglycerin when he has tingling in his left chest.  One nitroglycerin relieves this tingling feeling he gets from time to time.  He says if he gets GERD  or tingling in his left chest he will stop what he is doing and take nitroglycerin.  The most he has taken in one day according to patient is 5 tablets.  RN will reach out to [facility] to make her aware of the Nitroglycerin use.  Wife, Dorian Pod, accompanied patient to orientation.  Ellen's cell number is:  [phone]; Home [phone]; Sam's Cell [phone].  Sam's main goal in coming to Cardiac Rehab is increase stamina; decrease SOB; weight loss.  Sam states he would like to weigh less than 200 pounds.  Currently his weight is 250.5 pounds.  Sam will start Cardiac Rehab on Monday morning June 5th at 7:30 a.m.  He will be out of town June 6th - 16th; July 9th - 14th; August 6th - 11th; September 10th - 15th.     30 day review.  Continue with ITP 30 day review. Continue with ITP I called and spoke to Dr. Laverna Peace nurse about "Sam" Drue Stager. re; after exercise and relaxaton his blood pressure as 78/50. Given 240cc of water times 2 and his blood pressure  came up to 90/70 but he was still dizzy. I asked Felipe Paluch" if he was on a fluid restriction and he said he didn't know of any. I gave him approx 200cc more of water and his blood pressure dropped to 80/50. We had him sit here for 10 minutes and dhis blood pressure came up to 104/58 without dizziness. Later I spoke to his wife and she said this week Sammy has been c/o being dizzy when he stands up. His wife will call back the Nurse and will ask if he is suppose to be on any type of fluid restriction and about his current medicines which he is taking.    [place] Name 07/07/16 1005 07/21/16 0757         ITP Comments Sammy also said that "this thing is not working and I won't go back to the doctor till Sept. 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  30 day review.  Continue with ITP unless changes noted by Medical Director at signature of review.         Comments:

## 2016-07-26 ENCOUNTER — Encounter: Payer: PPO | Admitting: *Deleted

## 2016-07-26 DIAGNOSIS — Z23 Encounter for immunization: Secondary | ICD-10-CM | POA: Diagnosis not present

## 2016-07-26 DIAGNOSIS — I5022 Chronic systolic (congestive) heart failure: Secondary | ICD-10-CM

## 2016-07-26 DIAGNOSIS — I214 Non-ST elevation (NSTEMI) myocardial infarction: Secondary | ICD-10-CM | POA: Diagnosis not present

## 2016-07-26 DIAGNOSIS — E875 Hyperkalemia: Secondary | ICD-10-CM | POA: Diagnosis not present

## 2016-07-26 DIAGNOSIS — N179 Acute kidney failure, unspecified: Secondary | ICD-10-CM | POA: Diagnosis not present

## 2016-07-26 DIAGNOSIS — Z0000 Encounter for general adult medical examination without abnormal findings: Secondary | ICD-10-CM | POA: Diagnosis not present

## 2016-07-26 NOTE — Progress Notes (Signed)
Daily Session Note  Patient Details  Name: [name] MRN: [id number] Date of Birth: [birth year] Referring Provider:   Flowsheet Row Cardiac Rehab from 04/28/2016 in [facility] Cardiac and Pulmonary Rehab  Referring Provider  Delora Fuel MD      Encounter Date: 07/26/2016  Check In:     Session Check In - 07/26/16 0815      Check-In   Location [facility]-Cardiac & Pulmonary Rehab   Staff Present [name], RN, BSN, Laveda Norman, BS, ACSM CEP, Exercise Physiologist;Jessica Madera, [place], ACSM RCEP, Exercise Physiologist   Supervising physician immediately available to respond to emergencies See telemetry face sheet for immediately available ER MD   Medication changes reported     No   Fall or balance concerns reported    No   Warm-up and Cool-down Performed on first and last piece of equipment   Resistance Training Performed Yes   VAD Patient? No     Pain Assessment   Currently in Pain? No/denies   Multiple Pain Sites No         Goals Met:  Independence with exercise equipment Exercise tolerated well No report of cardiac concerns or symptoms Strength training completed today  Goals Unmet:  Not Applicable  Comments: Pt able to follow exercise prescription today without complaint.  Will continue to monitor for progression.    Dr. Emily Filbert is Medical Director for [place] and [facility].

## 2016-07-28 ENCOUNTER — Encounter: Payer: PPO | Admitting: *Deleted

## 2016-07-28 DIAGNOSIS — I5022 Chronic systolic (congestive) heart failure: Secondary | ICD-10-CM

## 2016-07-28 DIAGNOSIS — I214 Non-ST elevation (NSTEMI) myocardial infarction: Secondary | ICD-10-CM

## 2016-07-28 NOTE — Progress Notes (Signed)
Daily Session Note  Patient Details  Name: [name] MRN: [id number] Date of Birth: 11/29/1946 Referring Provider:   Flowsheet Row Cardiac Rehab from 04/28/2016 in [facility] Cardiac and Pulmonary Rehab  Referring Provider  Delora Fuel MD      Encounter Date: 07/28/2016  Check In:     Session Check In - 07/28/16 1027      Check-In   Location [facility]-Cardiac & Pulmonary Rehab   Staff Present Nyoka Cowden, RN, BSN, Willette Pa, MA, ACSM RCEP, Exercise Physiologist;Amanda Oletta Darter, [place], ACSM CEP, Exercise Physiologist   Supervising physician immediately available to respond to emergencies See telemetry face sheet for immediately available ER MD   Medication changes reported     No   Fall or balance concerns reported    No   Warm-up and Cool-down Performed on first and last piece of equipment   Resistance Training Performed Yes   VAD Patient? No     Pain Assessment   Currently in Pain? No/denies   Multiple Pain Sites No         Goals Met:  Independence with exercise equipment Changing diet to healthy choices, watching portion sizes Exercise tolerated well Personal goals reviewed No report of cardiac concerns or symptoms Strength training completed today  Goals Unmet:  Not Applicable  Comments: Pt able to follow exercise prescription today without complaint.  Will continue to monitor for progression.    Dr. Emily Filbert is Medical Director for [place] [facility].

## 2016-07-30 ENCOUNTER — Encounter: Payer: Self-pay | Admitting: Emergency Medicine

## 2016-07-30 ENCOUNTER — Encounter: Payer: PPO | Admitting: *Deleted

## 2016-07-30 ENCOUNTER — Emergency Department: Payer: PPO

## 2016-07-30 ENCOUNTER — Emergency Department
Admission: EM | Admit: 2016-07-30 | Discharge: 2016-07-31 | Disposition: A | Discharge status: Home / Self Care | Payer: PPO | Attending: Emergency Medicine | Admitting: Emergency Medicine

## 2016-07-30 DIAGNOSIS — Z79899 Other long term (current) drug therapy: Secondary | ICD-10-CM | POA: Diagnosis not present

## 2016-07-30 DIAGNOSIS — Z7982 Long term (current) use of aspirin: Secondary | ICD-10-CM | POA: Insufficient documentation

## 2016-07-30 DIAGNOSIS — E119 Type 2 diabetes mellitus without complications: Secondary | ICD-10-CM | POA: Diagnosis not present

## 2016-07-30 DIAGNOSIS — R0602 Shortness of breath: Secondary | ICD-10-CM | POA: Diagnosis not present

## 2016-07-30 DIAGNOSIS — I110 Hypertensive heart disease with heart failure: Secondary | ICD-10-CM | POA: Diagnosis not present

## 2016-07-30 DIAGNOSIS — Z7984 Long term (current) use of oral hypoglycemic drugs: Secondary | ICD-10-CM | POA: Diagnosis not present

## 2016-07-30 DIAGNOSIS — R778 Other specified abnormalities of plasma proteins: Secondary | ICD-10-CM

## 2016-07-30 DIAGNOSIS — I252 Old myocardial infarction: Secondary | ICD-10-CM | POA: Insufficient documentation

## 2016-07-30 DIAGNOSIS — R7989 Other specified abnormal findings of blood chemistry: Secondary | ICD-10-CM | POA: Diagnosis not present

## 2016-07-30 DIAGNOSIS — Z8546 Personal history of malignant neoplasm of prostate: Secondary | ICD-10-CM | POA: Diagnosis not present

## 2016-07-30 DIAGNOSIS — J449 Chronic obstructive pulmonary disease, unspecified: Secondary | ICD-10-CM | POA: Insufficient documentation

## 2016-07-30 DIAGNOSIS — Z87891 Personal history of nicotine dependence: Secondary | ICD-10-CM | POA: Insufficient documentation

## 2016-07-30 DIAGNOSIS — I214 Non-ST elevation (NSTEMI) myocardial infarction: Secondary | ICD-10-CM | POA: Diagnosis not present

## 2016-07-30 DIAGNOSIS — I509 Heart failure, unspecified: Secondary | ICD-10-CM | POA: Diagnosis not present

## 2016-07-30 DIAGNOSIS — I5022 Chronic systolic (congestive) heart failure: Secondary | ICD-10-CM

## 2016-07-30 LAB — CBC
HCT: 28.5 % — ABNORMAL LOW (ref 40.0–52.0)
HEMOGLOBIN: 9.7 g/dL — AB (ref 13.0–18.0)
MCH: 30.5 pg (ref 26.0–34.0)
MCHC: 34.1 g/dL (ref 32.0–36.0)
MCV: 89.3 fL (ref 80.0–100.0)
PLATELETS: 181 10*3/uL (ref 150–440)
RBC: 3.19 MIL/uL — AB (ref 4.40–5.90)
RDW: 15.6 % — ABNORMAL HIGH (ref 11.5–14.5)
WBC: 7.3 10*3/uL (ref 3.8–10.6)

## 2016-07-30 LAB — BRAIN NATRIURETIC PEPTIDE: B NATRIURETIC PEPTIDE 5: 474 pg/mL — AB (ref 0.0–100.0)

## 2016-07-30 NOTE — ED Provider Notes (Signed)
[facility] Emergency Department Provider Note  ____________________________________________   First MD Initiated Contact with Patient 07/30/16 2339     (approximate)  I have reviewed the triage vital signs and the nursing notes.   HISTORY  Chief Complaint Shortness of Breath    HPI [name] is a 70 y.o. 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 Kyla Balzarine at [facility] and Dr. Nehemiah Massed locally who presents for evaluation of gradual onset of shortness of breath earlier today.  He states that recently his potassium was too low so his Lasix was decreased.  Today he was working out in the yard and exertion made his shortness of breath starts and then worsen.  His symptoms got better with rest.  He denies ever having any chest pain.  He also denies excessive diaphoresis, nausea, vomiting, abdominal pain, dysuria, fever/chills, lightheadedness, dizziness, as well as pain and weakness in any of his extremities.  He describes his symptoms as moderate to severe with exertion but mild at rest.  He states that he feels like he does when he has to much fluid.   Past Medical History:  Diagnosis Date  . Anemia   . Cancer ([place]) 12/2013   prostate  . 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 Cardiomyopathy, ischemic   . CHF (congestive heart failure) ([employer])   . COPD (chronic obstructive pulmonary disease) ([place])   . Diabetes mellitus without complication ([place])   . GERD (gastroesophageal reflux disease)   . Hypercholesteremia   . Hypertension   . Myocardial infarction ([place]) [id number]  . Shortness of breath dyspnea     Patient Active Problem List   Diagnosis Date Noted  . HTN (hypertension) 02/13/2016  . COPD with chronic bronchitis ([place]) 02/13/2016  . Obstructive sleep apnea 02/13/2016  . Diabetes ([place]) 02/13/2016  . Chronic systolic HF (heart failure)  ([place]) 12/12/2015    Past Surgical History:  Procedure Laterality Date  . CARDIAC CATHETERIZATION N/A 02/02/2016   Procedure: Left Heart Cath and Coronary Angiography;  Surgeon: [name], MD;  Location: [place] [facility];  Service: Cardiovascular;  Laterality: N/A;  . CORONARY ANGIOPLASTY WITH STENT PLACEMENT    . CORONARY ARTERY BYPASS GRAFT  11/24/2010  . IMPLANTABLE CARDIOVERTER DEFIBRILLATOR (ICD) GENERATOR CHANGE Left 12/12/2015   Procedure: DUAL LEAD PLACEMENT CARDIAC DIFIBRILLATOR;  Surgeon: [name], MD;  Location: [facility] ORS;  Service: Cardiovascular;  Laterality: Left;  . TONSILLECTOMY      Prior to Admission medications   Medication Sig Start Date End Date Taking? Authorizing Provider  acetaminophen (TYLENOL) 325 MG tablet Take 2 tablets (650 mg total) by mouth every 6 (six) hours as needed for mild pain (or Fever >/= 101). 02/02/16   Nicholes Mango, MD  albuterol (PROVENTIL HFA;VENTOLIN HFA) 108 (90 Base) MCG/ACT inhaler Inhale 2 puffs into the lungs 3 (three) times daily as needed for wheezing or shortness of breath.    Historical Provider, MD  albuterol-ipratropium (COMBIVENT) 18-103 MCG/ACT inhaler Inhale 1 puff into the lungs 2 times daily at 12 noon and 4 pm.    Historical Provider, MD  aspirin 81 MG tablet Take 81 mg by mouth every morning.    Historical Provider, MD  atorvastatin (LIPITOR) 40 MG tablet Take 80 mg by mouth every evening.     Historical Provider, MD  budesonide-formoterol (SYMBICORT) 80-4.5 MCG/ACT inhaler Inhale 2 puffs  into the lungs 2 (two) times daily.    Historical Provider, MD  clopidogrel (PLAVIX) 75 MG tablet Take 75 mg by mouth every morning.    Historical Provider, MD  dexlansoprazole (DEXILANT) 60 MG capsule Take 60 mg by mouth daily. Reported on 04/28/2016    Historical Provider, MD  ferrous sulfate 325 (65 FE) MG tablet Take 325 mg by mouth 2 (two) times daily with a meal.    Historical Provider, MD  finasteride (PROSCAR) 5 MG tablet Take  5 mg by mouth at bedtime.    Historical Provider, MD  FLUoxetine (PROZAC) 20 MG tablet Take 20 mg by mouth every morning.    Historical Provider, MD  furosemide (LASIX) 40 MG tablet Take 1 tablet (40 mg total) by mouth 2 (two) times daily. 07/31/16 07/31/17  Hinda Kehr, MD  gabapentin (NEURONTIN) 300 MG capsule Take 600 mg by mouth 3 (three) times daily.    Historical Provider, MD  insulin glargine (LANTUS) 100 UNIT/ML injection Inject 0.75 mLs (75 Units total) into the skin at bedtime. Patient taking differently: Inject 60 Units into the skin at bedtime.  02/02/16   Nicholes Mango, MD  lisinopril (PRINIVIL,ZESTRIL) 20 MG tablet Take 20 mg by mouth daily.    Historical Provider, MD  loratadine (CLARITIN) 10 MG tablet Take 10 mg by mouth daily.    Historical Provider, MD  metFORMIN (GLUCOPHAGE) 1000 MG tablet Take 1,000 mg by mouth 2 (two) times daily with a meal.     Historical Provider, MD  metoprolol (LOPRESSOR) 50 MG tablet Take 50 mg by mouth 2 (two) times daily.    Historical Provider, MD  Multiple Vitamins-Minerals (CENTRUM SILVER PO) Take 1 tablet by mouth every morning.    Historical Provider, MD  nitroGLYCERIN (NITROSTAT) 0.4 MG SL tablet Place 0.4 mg under the tongue every 5 (five) minutes as needed for chest pain.    Historical Provider, MD  potassium chloride SA (KLOR-CON M20) 20 MEQ tablet Take 1 tablet (20 mEq total) by mouth daily. 07/31/16   Hinda Kehr, MD  ranolazine (RANEXA) 500 MG 12 hr tablet Take 500 mg by mouth 2 (two) times daily.    Historical Provider, MD  tamsulosin (FLOMAX) 0.4 MG CAPS capsule Take 0.4 mg by mouth daily after supper.    Historical Provider, MD    Allergies Benadryl [diphenhydramine]; Doxycycline; and Lopid [gemfibrozil]  Family History  Problem Relation Age of Onset  . CAD Mother   . Alzheimer's disease Father     Social History Social History  Substance Use Topics  . Smoking status: Former Smoker    Packs/day: 2.00    Years: 30.00    Quit  date: 12/03/1996  . Smokeless tobacco: Never Used  . Alcohol use No    Review of Systems Constitutional: No fever/chills Eyes: No visual changes. ENT: No sore throat. Cardiovascular: Denies chest pain. Respiratory: +shortness of breath. Gastrointestinal: No abdominal pain.  No nausea, no vomiting.  No diarrhea.  No constipation. Genitourinary: Negative for dysuria. Musculoskeletal: Negative for back pain. Skin: Negative for rash. Neurological: Negative for headaches, focal weakness or numbness.  10-point ROS otherwise negative.  ____________________________________________   PHYSICAL EXAM:  VITAL SIGNS: ED Triage Vitals [07/30/16 2221]  Enc Vitals Group     BP 126/63     Pulse Rate 81     Resp 18     Temp 98.4 F (36.9 C)     Temp Source Oral     SpO2 96 %  Weight 240 lb (108.9 kg)     Height 5\' 8"  (1.727 m)     Head Circumference      Peak Flow      Pain Score 0     Pain Loc      Pain Edu?      Excl. in [place]?     Constitutional: Alert and oriented. Well appearing and in no acute distress. Eyes: Conjunctivae are normal. PERRL. EOMI. Head: Atraumatic. Nose: No congestion/rhinnorhea. Mouth/Throat: Mucous membranes are moist.  Oropharynx non-erythematous. Neck: No stridor.  No meningeal signs.   Cardiovascular: Normal rate, regular rhythm. Good peripheral circulation. Grossly normal heart sounds. Respiratory: Normal respiratory effort.  No retractions. Lungs CTAB. Gastrointestinal: Soft and nontender. No distention.  Musculoskeletal: Trace pitting edema bilateral lower extremities. No gross deformities of extremities. Neurologic:  Normal speech and language. No gross focal neurologic deficits are appreciated.  Skin:  Skin is warm, dry and intact. No rash noted. Psychiatric: Mood and affect are normal. Speech and behavior are normal.  ____________________________________________   LABS (all labs ordered are listed, but only abnormal results are  displayed)  Labs Reviewed  BASIC METABOLIC PANEL - Abnormal; Notable for the following:       Result Value   Potassium 3.3 (*)    Glucose, Bld 123 (*)    Creatinine, Ser 1.46 (*)    Calcium 8.5 (*)    GFR calc non Af Amer 47 (*)    GFR calc Af Amer 54 (*)    All other components within normal limits  CBC - Abnormal; Notable for the following:    RBC 3.19 (*)    Hemoglobin 9.7 (*)    HCT 28.5 (*)    RDW 15.6 (*)    All other components within normal limits  TROPONIN I - Abnormal; Notable for the following:    Troponin I 0.06 (*)    All other components within normal limits  BRAIN NATRIURETIC PEPTIDE - Abnormal; Notable for the following:    B Natriuretic Peptide 474.0 (*)    All other components within normal limits  TROPONIN I - Abnormal; Notable for the following:    Troponin I 0.07 (*)    All other components within normal limits   ____________________________________________  EKG  ED ECG REPORT I, Joseth Weigel, the attending physician, personally viewed and interpreted this ECG.   Date: 07/30/2016  EKG Time: 22:30  Rate: 81  Rhythm: Atrial sensed ventricular paced rhythm with occasional PVC  Axis: Left axis deviation  Intervals:Paced rhythm  ST&T Change: Non-specific ST segment / T-wave changes, but no evidence of acute ischemia.   ____________________________________________  RADIOLOGY   Dg Chest 2 View  Result Date: 07/30/2016 CLINICAL DATA:  Shortness of breath. History CHF and COPD. Recently decreased Lasix. EXAM: CHEST  2 VIEW COMPARISON:  Chest radiograph February 05, 2016 FINDINGS: Cardiac silhouette is upper limits of normal in size. Status post median not sternotomy for CABG. Coronary artery stents present. Pulmonary vascular congestion and interstitial prominence. Increased lung volumes with flattened hemidiaphragms. Small LEFT pleural effusion without focal consolidation. No pneumothorax. LEFT cardiac defibrillator in situ. No pneumothorax. Soft tissue  planes and included osseous structures are non nonsuspicious. IMPRESSION: Borderline cardiomegaly. COPD with pulmonary vascular congestion/edema. Small LEFT pleural effusion. Electronically Signed   By: Elon Alas M.D.   On: 07/30/2016 23:23    ____________________________________________   PROCEDURES  Procedure(s) performed:   Procedures   Critical Care performed: No ____________________________________________   INITIAL IMPRESSION /  ASSESSMENT AND PLAN / ED COURSE  Pertinent labs & imaging results that were available during my care of the patient were reviewed by me and considered in my medical decision making (see chart for details).  The patient has a troponin of 0.06 but this is likely demand ischemia in the setting of worsening CHF.  I am awaiting results of his BNP and we will also try to use the HF vest for a numeric result.  His chest x-ray does show some pulmonary vascular congestion, but he is quite comfortable and his lungs sound clear.  If it is possible to treat him with an extra dose of Lasix and outpatient follow-up the patient would prefer that.  I will reassess after the rest of his lab work is back.  At a minimum we will repeat his troponin to make sure it is not trending up.   Clinical Course  Comment By Time  The patient has remained stable and asymptomatic throughout his emergency department stay.  His troponin has remained consistent at 0.06 and 0.07.  Several months ago he had a hospitalization for NSTEMI and had a cardiac catheterization.  His troponin was in the 0.4 range at that time.  He is followed closely by Dr. Nehemiah Massed.  I believe that his troponin today represents mild demand ischemia in the setting of moderate CHF.  I gave him a dose of Lasix 40 mg IV and encouraged him to go back up on his daily Lasix to 40 mg twice a day, which she was on previously and worked well for him.  I am prescribing him a potassium supplement and I encouraged him to call  Dr. Nehemiah Massed for the next available visit.  I explained all of this to the patient and his wife and they understand and agree with the plan.I gave my usual and customary return precautions. Hinda Kehr, MD 08/26 0304    ____________________________________________  FINAL CLINICAL IMPRESSION(S) / ED DIAGNOSES  Final diagnoses:  Acute on chronic congestive heart failure, unspecified congestive heart failure type (HCC)  Elevated troponin I level     MEDICATIONS GIVEN DURING THIS VISIT:  Medications  furosemide (LASIX) injection 40 mg (40 mg Intravenous Given 07/31/16 0141)  potassium chloride SA (K-DUR,KLOR-CON) CR tablet 40 mEq (40 mEq Oral Given 07/31/16 0137)     NEW OUTPATIENT MEDICATIONS STARTED DURING THIS VISIT:  New Prescriptions   FUROSEMIDE (LASIX) 40 MG TABLET    Take 1 tablet (40 mg total) by mouth 2 (two) times daily.   POTASSIUM CHLORIDE SA (KLOR-CON M20) 20 MEQ TABLET    Take 1 tablet (20 mEq total) by mouth daily.      Note:  This document was prepared using Dragon voice recognition software and may include unintentional dictation errors.   Hinda Kehr, MD 07/31/16 [phone]

## 2016-07-30 NOTE — Progress Notes (Signed)
Daily Session Note  Patient Details  Name: [name] MRN: [id number] Date of Birth: Jun 17, 1946 Referring Provider:   Flowsheet Row Cardiac Rehab from 04/28/2016 in [facility]  Referring Provider  Delora Fuel MD      Encounter Date: 07/30/2016  Check In:     Session Check In - 07/30/16 0947      Check-In   Location [facility]   Staff Present Nyoka Cowden, RN, BSN, MA;Susanne Bice, RN, BSN, CCRP;Lavaya Defreitas Luan Pulling, MA, ACSM RCEP, Exercise Physiologist   Supervising physician immediately available to respond to emergencies See telemetry face sheet for immediately available ER MD   Medication changes reported     No   Fall or balance concerns reported    No   Warm-up and Cool-down Performed on first and last piece of equipment   Resistance Training Performed Yes   VAD Patient? No     Pain Assessment   Currently in Pain? No/denies   Multiple Pain Sites No           Exercise Prescription Changes - 07/29/16 1400      Exercise Review   Progression Yes     Response to Exercise   Blood Pressure (Admit) 128/60   Blood Pressure (Exercise) 146/70   Blood Pressure (Exit) 116/64   Heart Rate (Admit) 86 bpm   Heart Rate (Exercise) 108 bpm   Heart Rate (Exit) 84 bpm   Rating of Perceived Exertion (Exercise) 14   Symptoms none   Comments Home Exercise Guidelines given 06/30/16   Duration Progress to 45 minutes of aerobic exercise without signs/symptoms of physical distress   Intensity THRR unchanged     Progression   Progression Continue to progress workloads to maintain intensity without signs/symptoms of physical distress.   Average METs 2.46     Resistance Training   Training Prescription Yes   Weight 4 lbs   Reps 10-15     Interval Training   Interval Training No     Treadmill   MPH 1   Grade 0.5   Minutes 15   METs 2     NuStep   Level 6   Minutes 15   METs 2.4     Biostep-RELP   Level 2   Minutes 15    METs 2     Home Exercise Plan   Plans to continue exercise at Home  walking   Frequency Add 2 additional days to program exercise sessions.      Goals Met:  Independence with exercise equipment Using PLB without cueing & demonstrates good technique Exercise tolerated well Personal goals reviewed No report of cardiac concerns or symptoms Strength training completed today  Goals Unmet:  Not Applicable  Comments: Pt able to follow exercise prescription today without complaint.  Will continue to monitor for progression.    Dr. Emily Filbert is Medical Director for [facility] and [facility].

## 2016-07-30 NOTE — ED Notes (Signed)
FIRST NURSE NOTE: Patient presents with c/o increasing SOB. PMH significant for COPD and CHF. Patient recently had Lasix decreased due to his Potassium. Patient denies increased swelling, however reports that he knows that the "fluid is building up". (+) marked orthopnea reported.  Patient is NAD; able to speak in complete sentences.

## 2016-07-30 NOTE — ED Triage Notes (Signed)
C/O increasing SOB this afternoon.  Patient reports a dry cough for the past two days.  Recently decreased Lasix dose due to hypokalemia.  Spouse has medication list.

## 2016-07-31 LAB — TROPONIN I
TROPONIN I: 0.07 ng/mL — AB (ref <0.03)
Troponin I: 0.06 ng/mL (ref <0.03)

## 2016-07-31 LAB — BASIC METABOLIC PANEL
ANION GAP: 9 (ref 5–15)
BUN: 14 mg/dL (ref 6–20)
CALCIUM: 8.5 mg/dL — AB (ref 8.9–10.3)
CHLORIDE: 104 mmol/L (ref 101–111)
CO2: 25 mmol/L (ref 22–32)
Creatinine, Ser: 1.46 mg/dL — ABNORMAL HIGH (ref 0.61–1.24)
GFR calc non Af Amer: 47 mL/min — ABNORMAL LOW (ref >60)
GFR, EST AFRICAN AMERICAN: 54 mL/min — AB (ref >60)
Glucose, Bld: 123 mg/dL — ABNORMAL HIGH (ref 65–99)
Potassium: 3.3 mmol/L — ABNORMAL LOW (ref 3.5–5.1)
Sodium: 138 mmol/L (ref 135–145)

## 2016-07-31 MED ORDER — FUROSEMIDE 10 MG/ML IJ SOLN
40.0000 mg | Freq: Once | INTRAMUSCULAR | Status: AC
  Administered 2016-07-31: 40 mg via INTRAVENOUS
  Filled 2016-07-31: qty 4

## 2016-07-31 MED ORDER — POTASSIUM CHLORIDE CRYS ER 20 MEQ PO TBCR
40.0000 meq | EXTENDED_RELEASE_TABLET | Freq: Once | ORAL | Status: AC
  Administered 2016-07-31: 40 meq via ORAL
  Filled 2016-07-31: qty 2

## 2016-07-31 MED ORDER — POTASSIUM CHLORIDE CRYS ER 20 MEQ PO TBCR: 20.0000 meq | EXTENDED_RELEASE_TABLET | Freq: Every day | ORAL | 0 refills | Status: DC

## 2016-07-31 MED ORDER — FUROSEMIDE 40 MG PO TABS: 40.0000 mg | ORAL_TABLET | Freq: Two times a day (BID) | ORAL | 2 refills | Status: DC

## 2016-07-31 NOTE — ED Notes (Addendum)
REDS VEST READING = 37 CHEST RULER = 5  VEST FITTING TASKS: POSTURE = Sitting HEIGHT MARKER = Tall CENTER STRIP = Shifted  COMMENTS: Experienced difficulty with obtaining reading. RN in communication with D[employer] with [employer]. Finally able to obtain a "good quality" reading that was approved by [employer] representative. Reading information communicated to Karma Greaser, MD. Additionally, no consult placed for CHF clinic; patient already in cardiac rehab program secondary to chronic systolic HF. In review of his EMR, it appears that he has been seen recently by Darylene Price; last note was from May 2017.

## 2016-07-31 NOTE — Discharge Instructions (Signed)
As we discussed, you do have a little bit too much fluid at this time consistent with an acute CHF exacerbation.  Fortunately it does not require you to stay in the hospital at this time.  We gave you an extra dose of Lasix.  Please take your medications as recommended and follow-up with the doctor listed at the next available opportunity.  Return to the emergency department if you develop any new or worsening symptoms that concern you.

## 2016-07-31 NOTE — ED Notes (Signed)
CHF vest being completed by Danny Lawless, RN at bedside.

## 2016-08-01 DIAGNOSIS — Z9911 Dependence on respirator [ventilator] status: Secondary | ICD-10-CM | POA: Diagnosis not present

## 2016-08-01 DIAGNOSIS — I5023 Acute on chronic systolic (congestive) heart failure: Secondary | ICD-10-CM | POA: Diagnosis not present

## 2016-08-01 DIAGNOSIS — Z9981 Dependence on supplemental oxygen: Secondary | ICD-10-CM | POA: Diagnosis not present

## 2016-08-01 DIAGNOSIS — D638 Anemia in other chronic diseases classified elsewhere: Secondary | ICD-10-CM | POA: Diagnosis not present

## 2016-08-01 DIAGNOSIS — K219 Gastro-esophageal reflux disease without esophagitis: Secondary | ICD-10-CM | POA: Diagnosis not present

## 2016-08-01 DIAGNOSIS — R0789 Other chest pain: Secondary | ICD-10-CM | POA: Diagnosis not present

## 2016-08-01 DIAGNOSIS — I255 Ischemic cardiomyopathy: Secondary | ICD-10-CM | POA: Diagnosis not present

## 2016-08-01 DIAGNOSIS — E7800 Pure hypercholesterolemia, unspecified: Secondary | ICD-10-CM | POA: Diagnosis not present

## 2016-08-01 DIAGNOSIS — N400 Enlarged prostate without lower urinary tract symptoms: Secondary | ICD-10-CM | POA: Diagnosis not present

## 2016-08-01 DIAGNOSIS — Z7901 Long term (current) use of anticoagulants: Secondary | ICD-10-CM | POA: Diagnosis not present

## 2016-08-01 DIAGNOSIS — Z951 Presence of aortocoronary bypass graft: Secondary | ICD-10-CM | POA: Diagnosis not present

## 2016-08-01 DIAGNOSIS — Z794 Long term (current) use of insulin: Secondary | ICD-10-CM | POA: Diagnosis not present

## 2016-08-01 DIAGNOSIS — I130 Hypertensive heart and chronic kidney disease with heart failure and stage 1 through stage 4 chronic kidney disease, or unspecified chronic kidney disease: Secondary | ICD-10-CM | POA: Diagnosis not present

## 2016-08-01 DIAGNOSIS — I2510 Atherosclerotic heart disease of native coronary artery without angina pectoris: Secondary | ICD-10-CM | POA: Diagnosis not present

## 2016-08-01 DIAGNOSIS — I509 Heart failure, unspecified: Secondary | ICD-10-CM | POA: Diagnosis not present

## 2016-08-01 DIAGNOSIS — G4733 Obstructive sleep apnea (adult) (pediatric): Secondary | ICD-10-CM | POA: Diagnosis not present

## 2016-08-01 DIAGNOSIS — N189 Chronic kidney disease, unspecified: Secondary | ICD-10-CM | POA: Diagnosis not present

## 2016-08-01 DIAGNOSIS — J449 Chronic obstructive pulmonary disease, unspecified: Secondary | ICD-10-CM | POA: Diagnosis not present

## 2016-08-01 DIAGNOSIS — Z95810 Presence of automatic (implantable) cardiac defibrillator: Secondary | ICD-10-CM | POA: Diagnosis not present

## 2016-08-01 DIAGNOSIS — Z7984 Long term (current) use of oral hypoglycemic drugs: Secondary | ICD-10-CM | POA: Diagnosis not present

## 2016-08-01 DIAGNOSIS — J90 Pleural effusion, not elsewhere classified: Secondary | ICD-10-CM | POA: Diagnosis not present

## 2016-08-01 DIAGNOSIS — E785 Hyperlipidemia, unspecified: Secondary | ICD-10-CM | POA: Diagnosis not present

## 2016-08-01 DIAGNOSIS — E1122 Type 2 diabetes mellitus with diabetic chronic kidney disease: Secondary | ICD-10-CM | POA: Diagnosis not present

## 2016-08-01 DIAGNOSIS — E1140 Type 2 diabetes mellitus with diabetic neuropathy, unspecified: Secondary | ICD-10-CM | POA: Diagnosis not present

## 2016-08-01 DIAGNOSIS — Z87891 Personal history of nicotine dependence: Secondary | ICD-10-CM | POA: Diagnosis not present

## 2016-08-01 DIAGNOSIS — M5134 Other intervertebral disc degeneration, thoracic region: Secondary | ICD-10-CM | POA: Diagnosis not present

## 2016-08-01 DIAGNOSIS — Z6836 Body mass index (BMI) 36.0-36.9, adult: Secondary | ICD-10-CM | POA: Diagnosis not present

## 2016-08-01 DIAGNOSIS — I110 Hypertensive heart disease with heart failure: Secondary | ICD-10-CM | POA: Diagnosis not present

## 2016-08-01 DIAGNOSIS — R0602 Shortness of breath: Secondary | ICD-10-CM | POA: Diagnosis not present

## 2016-08-04 DIAGNOSIS — R0789 Other chest pain: Secondary | ICD-10-CM | POA: Diagnosis not present

## 2016-08-06 ENCOUNTER — Encounter: Payer: Self-pay | Admitting: *Deleted

## 2016-08-06 ENCOUNTER — Telehealth: Payer: Self-pay | Admitting: *Deleted

## 2016-08-06 DIAGNOSIS — I5022 Chronic systolic (congestive) heart failure: Secondary | ICD-10-CM

## 2016-08-06 DIAGNOSIS — I214 Non-ST elevation (NSTEMI) myocardial infarction: Secondary | ICD-10-CM

## 2016-08-06 NOTE — Telephone Encounter (Signed)
Called to check on patient.  He had been admitted to [facility] for acute heart failure.  He was planning to graduate today due to vacation plans.  After talking with him, he request to go ahead and discharge from the program today.  He had already completed his post test.  He plans to continue to exercise by going to the mall to walk.

## 2016-08-06 NOTE — Progress Notes (Signed)
Cardiac Individual Treatment Plan  Patient Details  Name: [name] MRN: [id number] Date of Birth: 28-Nov-1946 Referring Provider:   Flowsheet Row Cardiac Rehab from 04/28/2016 in [facility] Cardiac and Pulmonary Rehab  Referring Provider  Delora Fuel MD      Initial Encounter Date:  Flowsheet Row Cardiac Rehab from 04/28/2016 in [facility] Cardiac and Pulmonary Rehab  Date  04/28/16  Referring Provider  Delora Fuel MD      Visit Diagnosis: NSTEMI (non-ST elevated myocardial infarction) ([place])  Chronic systolic heart failure ([place])  Patient's Home Medications on Admission:  Current Outpatient Prescriptions:  .  acetaminophen (TYLENOL) 325 MG tablet, Take 2 tablets (650 mg total) by mouth every 6 (six) hours as needed for mild pain (or Fever >/= 101)., Disp: , Rfl:  .  albuterol (PROVENTIL HFA;VENTOLIN HFA) 108 (90 Base) MCG/ACT inhaler, Inhale 2 puffs into the lungs 3 (three) times daily as needed for wheezing or shortness of breath., Disp: , Rfl:  .  albuterol-ipratropium (COMBIVENT) 18-103 MCG/ACT inhaler, Inhale 1 puff into the lungs 2 times daily at 12 noon and 4 pm., Disp: , Rfl:  .  aspirin 81 MG tablet, Take 81 mg by mouth every morning., Disp: , Rfl:  .  atorvastatin (LIPITOR) 40 MG tablet, Take 80 mg by mouth every evening. , Disp: , Rfl:  .  budesonide-formoterol (SYMBICORT) 80-4.5 MCG/ACT inhaler, Inhale 2 puffs into the lungs 2 (two) times daily., Disp: , Rfl:  .  clopidogrel (PLAVIX) 75 MG tablet, Take 75 mg by mouth every morning., Disp: , Rfl:  .  dexlansoprazole (DEXILANT) 60 MG capsule, Take 60 mg by mouth daily. Reported on 04/28/2016, Disp: , Rfl:  .  ferrous sulfate 325 (65 FE) MG tablet, Take 325 mg by mouth 2 (two) times daily with a meal., Disp: , Rfl:  .  finasteride (PROSCAR) 5 MG tablet, Take 5 mg by mouth at bedtime., Disp: , Rfl:  .  FLUoxetine (PROZAC) 20 MG tablet, Take 20 mg by mouth every morning., Disp: , Rfl:  .  furosemide (LASIX) 40 MG tablet, Take 1  tablet (40 mg total) by mouth 2 (two) times daily., Disp: 60 tablet, Rfl: 2 .  gabapentin (NEURONTIN) 300 MG capsule, Take 600 mg by mouth 3 (three) times daily., Disp: , Rfl:  .  insulin glargine (LANTUS) 100 UNIT/ML injection, Inject 0.75 mLs (75 Units total) into the skin at bedtime. (Patient taking differently: Inject 60 Units into the skin at bedtime. ), Disp: 10 mL, Rfl: 11 .  lisinopril (PRINIVIL,ZESTRIL) 20 MG tablet, Take 20 mg by mouth daily., Disp: , Rfl:  .  loratadine (CLARITIN) 10 MG tablet, Take 10 mg by mouth daily., Disp: , Rfl:  .  metFORMIN (GLUCOPHAGE) 1000 MG tablet, Take 1,000 mg by mouth 2 (two) times daily with a meal. , Disp: , Rfl:  .  metoprolol (LOPRESSOR) 50 MG tablet, Take 50 mg by mouth 2 (two) times daily., Disp: , Rfl:  .  Multiple Vitamins-Minerals (CENTRUM SILVER PO), Take 1 tablet by mouth every morning., Disp: , Rfl:  .  nitroGLYCERIN (NITROSTAT) 0.4 MG SL tablet, Place 0.4 mg under the tongue every 5 (five) minutes as needed for chest pain., Disp: , Rfl:  .  potassium chloride SA (KLOR-CON M20) 20 MEQ tablet, Take 1 tablet (20 mEq total) by mouth daily., Disp: 30 tablet, Rfl: 0 .  ranolazine (RANEXA) 500 MG 12 hr tablet, Take 500 mg by mouth 2 (two) times daily., Disp: , Rfl:  .  tamsulosin (FLOMAX) 0.4 MG CAPS capsule, Take 0.4 mg by mouth daily after supper., Disp: , Rfl:   Past Medical History: Past Medical History:  Diagnosis Date  . Anemia   . Cancer ([place]) 12/2013   prostate  . Cardiogenic pulmonary edema ([place]) 12/19/2014  . Cardiomyopathy, ischemic   . CHF (congestive heart failure) ([place])   . COPD (chronic obstructive pulmonary disease) ([place])   . Diabetes mellitus without complication ([place])   . GERD (gastroesophageal reflux disease)   . Hypercholesteremia   . Hypertension   . Myocardial infarction ([place]) [id number]  . Shortness of breath dyspnea     Tobacco Use: History  Smoking Status  . Former Smoker  . Packs/day: 2.00  . Years: 30.00   . Quit date: 12/03/1996  Smokeless Tobacco  . Never Used    Labs: Recent Review Flowsheet Data    Labs for ITP Cardiac and Pulmonary Rehab Latest Ref Rng & Units 01/05/2014 01/31/2016   Cholestrol 0 - 200 mg/dL 140 -   LDLCALC 0 - 100 mg/dL 86 -   HDL 40 - 60 mg/dL 33(L) -   Trlycerides 0 - 200 mg/dL 103 -   HCO3 21.0 - 28.0 mEq/L - 20.2(L)   ACIDBASEDEF 0.0 - 2.0 mmol/L - 4.4(H)   O2SAT % - 92.4       Exercise Target Goals:    Exercise Program Goal: Individual exercise prescription set with THRR, safety & activity barriers. Participant demonstrates ability to understand and report RPE using BORG scale, to self-measure pulse accurately, and to acknowledge the importance of the exercise prescription.  Exercise Prescription Goal: Starting with aerobic activity 30 plus minutes a day, 3 days per week for initial exercise prescription. Provide home exercise prescription and guidelines that participant acknowledges understanding prior to discharge.  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     Cardiac Risk Stratification High      6 Minute Walk:     6 Minute Walk    Row Name 04/28/16 1449 07/30/16 0947       6 Minute Walk   Phase Initial Discharge    Distance 890 feet 902 feet    Distance % Change  - 1.3 %    Walk Time 6 minutes 5.98 minutes    # of Rest Breaks 0 3  10 sec, 27 sec, 24 sec    MPH 1.69 2.06    METS 1.62 1.57    RPE 17 15    Perceived Dyspnea   - 5    VO2 Peak 5.67 5.49    Symptoms Yes (comment) Yes (comment)    Comments Neuropathy, claudication, 2/10 chest tightness (possibly breathing) Shortness of Breath    Resting HR 68 bpm 81 bpm    Resting BP  112/60 130/66    Max Ex. HR 110 bpm 101 bpm    Max Ex. BP 138/64 134/60      Interval Oxygen   Interval Oxygen? Yes  -    Baseline Oxygen Saturation % 98 %  -    Baseline Liters of Oxygen 0 L  Room Air  -    1 Minute Oxygen Saturation % 98 %  -    1 Minute Liters of Oxygen 0 L  Room Air  -  3 Minute Oxygen Saturation % 95 %  -    3 Minute Liters of Oxygen 0 L  -    5 Minute Oxygen Saturation % 91 %  -    5 Minute Liters of Oxygen 0 L  -    6 Minute Oxygen Saturation % 91 %  -    6 Minute Liters of Oxygen 0 L  -       Initial Exercise Prescription:     Initial Exercise Prescription - 04/28/16 1500      Date of Initial Exercise RX and Referring Provider   Date 04/28/16   Referring Provider Delora Fuel MD     Treadmill   MPH 1   Grade 0   Minutes 10  5/5   METs 1.7     Bike   Level 0.4   Watts 20   Minutes 15   METs 1.6     Recumbant Bike   Level 1   RPM 50   Watts 20   Minutes 15   METs 1.6     NuStep   Level 1   Watts 20   Minutes 15   METs 1.6     Arm Ergometer   Level 1   Watts 20   Minutes 15   METs 1.6     Arm/Foot Ergometer   Level 1   Watts 20   Minutes 15   METs 1.6     Cybex   Level 1   RPM 50   Minutes 15   METs 1.6     Recumbant Elliptical   Level 1   RPM 50   Watts 20   Minutes 15   METs 1.6     Elliptical   Level 1   Speed 50   Minutes 15   METs 1.7     REL-XR   Level 1   Watts 20   Minutes 15   METs 1.6     T5 Nustep   Level 1   Watts 20   Minutes 15   METs 1.6     Biostep-RELP   Level 1   Watts 20   Minutes 15   METs 1.6     Prescription Details   Frequency (times per week) 3   Duration Progress to 30 minutes of continuous aerobic without signs/symptoms of physical distress     Intensity   THRR 40-80% of Max Heartrate 100-133   Ratings of Perceived Exertion 11-13   Perceived Dyspnea 0-4     Progression   Progression Continue to progress workloads to maintain intensity without  signs/symptoms of physical distress.     Resistance Training   Training Prescription Yes   Weight 2lbs   Reps 10-12      Perform Capillary Blood Glucose checks as needed.  Exercise Prescription Changes:     Exercise Prescription Changes    Row Name 05/10/16 0800 05/18/16 1400 06/03/16 0800 06/16/16 1600 06/30/16 0800     Exercise Review   Progression  - Yes Yes Yes Yes     Response to Exercise   Blood Pressure (Admit)  - 124/66 102/64 148/68 108/52   Blood Pressure (Exercise)  - 134/72 124/62 142/68 104/42   Blood Pressure (Exit)  - 118/58 104/54 122/84 134/60   Heart Rate (Admit)  - 86 bpm 80 bpm 63 bpm 73 bpm   Heart Rate (Exercise)  - 96 bpm 105 bpm 119 bpm 116 bpm   Heart Rate (Exit)  -  80 bpm 86 bpm 98 bpm 80 bpm   Rating of Perceived Exertion (Exercise)  - _0 Symptoms  - none none none none   Comments  -  -  -  - Home Exercise Guidelines given 06/30/16   Duration  - Progress to 45 minutes of aerobic exercise without signs/symptoms of physical distress Progress to 45 minutes of aerobic exercise without signs/symptoms of physical distress Progress to 45 minutes of aerobic exercise without signs/symptoms of physical distress Progress to 45 minutes of aerobic exercise without signs/symptoms of physical distress   Intensity  - THRR unchanged THRR unchanged THRR unchanged THRR unchanged     Progression   Progression  - Continue to progress workloads to maintain intensity without signs/symptoms of physical distress. Continue to progress workloads to maintain intensity without signs/symptoms of physical distress. Continue to progress workloads to maintain intensity without signs/symptoms of physical distress. Continue to progress workloads to maintain intensity without signs/symptoms of physical distress.   Average METs  - 1.9  - 2.6 2.13     Resistance Training   Training Prescription  - Yes  - Yes Yes   Weight  - 2lbs  - 2lbs 2lbs   Reps  - 10-12  - 10-12 10-12      Interval Training   Interval Training  - No  - No No     Treadmill   MPH -  -  - 1.2 1.2   Grade  -  -  - 0 0.5   Minutes -  -  - 15 15   METs -  -  - 1.92 2     NuStep   Level  - _1 Watts  - 36 38  -  -   Minutes  - 40 _2 METs  - 1.9 2.4 4 2.4     T5 Nustep   Level  - 3 4  -  -   Watts  - 20 34  -  -   Minutes  - 15 15  -  -   METs  - 1.9  -  -  -     Biostep-RELP   Level  - 2  - 2 2   Watts  - 25  -  -  -   Minutes  - 15  - 15 15   METs  - 2  - 2 2     Home Exercise Plan   Plans to continue exercise at  -  -  -  - Home  walking   Frequency  -  -  -  - Add 2 additional days to program exercise sessions.   Row Name 07/15/16 1100 07/29/16 1400           Exercise Review   Progression Yes Yes        Response to Exercise   Blood Pressure (Admit) 126/64 128/60      Blood Pressure (Exercise) 112/52 146/70      Blood Pressure (Exit) 110/60 116/64      Heart Rate (Admit) 85 bpm 86 bpm      Heart Rate (Exercise) 104 bpm 108 bpm      Heart Rate (Exit) 84 bpm 84 bpm      Rating of Perceived Exertion (Exercise) 13 14      Symptoms none none      Comments Home Exercise Guidelines given 06/30/16 Home Exercise Guidelines given  06/30/16      Duration Progress to 45 minutes of aerobic exercise without signs/symptoms of physical distress Progress to 45 minutes of aerobic exercise without signs/symptoms of physical distress      Intensity THRR unchanged THRR unchanged        Progression   Progression Continue to progress workloads to maintain intensity without signs/symptoms of physical distress. Continue to progress workloads to maintain intensity without signs/symptoms of physical distress.      Average METs 2.1 2.46        Resistance Training   Training Prescription Yes Yes      Weight 3 lbs 4 lbs      Reps 10-12 10-15        Interval Training   Interval Training No No        Treadmill   MPH 1.2 1      Grade 0.5 0.5      Minutes 15 15      METs 2 2         NuStep   Level 6 6      Minutes 15 15      METs 2.3 2.4        Biostep-RELP   Level 2 2      Minutes 15 15      METs 2 2        Home Exercise Plan   Plans to continue exercise at Home  walking Home  walking      Frequency Add 2 additional days to program exercise sessions. Add 2 additional days to program exercise sessions.         Exercise Comments:     Exercise Comments    Row Name 04/28/16 1454 05/05/16 1301 05/10/16 0844 05/18/16 1442 06/11/16 0945   Exercise Comments Pt surprised himself on how far he was able to walk.  Pt would really like to focus on increasing his exercise stamina. Medical record review completed.  Pt is scheduled to start on Friday 6/2. First full day of exercise completed.  He tolerated exercise workloads well.  He did complain of shortness of breath, but no worse than his normal. [name] is off to a good start with exercise.  He really seems to enjoy using the T4r stepper.  We will encourage him to try out the treadmill some for weight bearing activity.  This week he is vacationing in [place] and he was encouraged to walk on the beach daily! Tried new rotation today with 15 min stations.   Row Name 06/16/16 1626 06/30/16 0843 06/30/16 1101 07/15/16 1102 07/29/16 1441   Exercise Comments [name] is doing well with exercise when he is in town.  He does walk when he is out of town.  He is making some progress and we will continue to work with him on his progression. Reviewed home exercise with pt today.  Pt plans to walk at home for exercise.  Reviewed THR, pulse, RPE, sign and symptoms, NTG use, and when to call 911 or MD.  Also discussed weather considerations and indoor options.  Pt voiced understanding. [name] is doing well with exercise.  We discussed his home exercise today.  We wil continue to work with him on progression. [name] is doing well with exercise.  He is starting to walk at home some.   We will continue to monitor. [name] continues to improve with  exercise.  He is walking at home and is eager to get back to beach to walk  again.  He will be doing his post walk test at his next visit.  We will continue to monitor for progression.   [place] Name 07/30/16 [phone]           Exercise Comments Reviewed METs average and discussed progression with pt today.  [name] completed his post 6 min walk today.  He only increased by 12 ft as he walked faster and needed rest breaks for breathing.          Discharge Exercise Prescription (Final Exercise Prescription Changes):     Exercise Prescription Changes - 07/29/16 1400      Exercise Review   Progression Yes     Response to Exercise   Blood Pressure (Admit) 128/60   Blood Pressure (Exercise) 146/70   Blood Pressure (Exit) 116/64   Heart Rate (Admit) 86 bpm   Heart Rate (Exercise) 108 bpm   Heart Rate (Exit) 84 bpm   Rating of Perceived Exertion (Exercise) 14   Symptoms none   Comments Home Exercise Guidelines given 06/30/16   Duration Progress to 45 minutes of aerobic exercise without signs/symptoms of physical distress   Intensity THRR unchanged     Progression   Progression Continue to progress workloads to maintain intensity without signs/symptoms of physical distress.   Average METs 2.46     Resistance Training   Training Prescription Yes   Weight 4 lbs   Reps 10-15     Interval Training   Interval Training No     Treadmill   MPH 1   Grade 0.5   Minutes 15   METs 2     NuStep   Level 6   Minutes 15   METs 2.4     Biostep-RELP   Level 2   Minutes 15   METs 2     Home Exercise Plan   Plans to continue exercise at Home  walking   Frequency Add 2 additional days to program exercise sessions.      Nutrition:  Target Goals: Understanding of nutrition guidelines, daily intake of sodium <1530m, cholesterol <2059m calories 30% from fat and 7% or less from saturated fats, daily to have 5 or more servings of fruits and vegetables.  Biometrics:     Pre Biometrics -  04/28/16 1507      Pre Biometrics   Height 5' 7.25" (1.708 m)   Weight 250 lb 4.8 oz (113.5 kg)   Waist Circumference 46 inches   Hip Circumference 45.5 inches   Waist to Hip Ratio 1.01 %   BMI (Calculated) 39       Nutrition Therapy Plan and Nutrition Goals:   Nutrition Discharge: Rate Your Plate Scores:     Nutrition Assessments - 07/30/16 0951      Rate Your Plate Scores   Pre Score 73   Pre Score % 81 %   Post Score 71      Nutrition Goals Re-Evaluation:     Nutrition Goals Re-Evaluation    Row Name 07/07/16 1009             Personal Goal #1 Re-Evaluation   Personal Goal #1 -  ? asked nurse if he is suppose to be on fluid restriction.           Psychosocial: Target Goals: Acknowledge presence or absence of depression, maximize coping skills, provide positive support system. Participant is able to verbalize types and ability to use techniques and skills needed for reducing stress and depression.  Initial Review &  Psychosocial Screening:     Initial Psych Review & Screening - 04/28/16 1512      Initial Review   Current issues with Current Depression;Current Stress Concerns;Current Anxiety/Panic   Source of Stress Concerns Chronic Illness;Poor Coping Skills;Unable to participate in former interests or hobbies;Unable to perform yard/household activities     [place]? Yes   Concerns Inappropriate over/under dependence on family/friends   Comments Wife states since his last admission in April 2017 he has seemed insecure and does not want her out of his site.  She stated he will not even go to the shop by himself.  He is insisting that she will come with him to Cardiac Rehab.       Screening Interventions   Interventions Encouraged to exercise;Program counselor consult;Other (comment)   Comments PHQ-9 score was 14 today.  Presently on Prozac.  Upon asking patient if he had ever seen a counselor he said, "No."  Wife stated she  thought it would be a good idea for him to talk with someone besides her.  Will defer to Program Counselor for evaluation and treatment.  Email sent to Program       Quality of Life Scores:     Quality of Life - 07/30/16 0953      Quality of Life Scores   Health/Function Pre 13.3 %   Health/Function Post 12.37 %   Health/Function % Change -6.99 %   Socioeconomic Pre 18.57 %   Socioeconomic Post 21.67 %   Socioeconomic % Change  16.69 %   Psych/Spiritual Pre 16.79 %   Psych/Spiritual Post 17.5 %   Psych/Spiritual % Change 4.23 %   Family Pre 25.6 %   Family Post 20.8 %   Family % Change -18.75 %   GLOBAL Pre 16.91 %   GLOBAL Post 16.42 %   GLOBAL % Change -2.9 %      PHQ-9: Recent Review Flowsheet Data    Depression screen [facility] 2/9 07/30/2016 04/28/2016 04/05/2016 02/13/2016   Decreased Interest 3 3 0 0   Down, Depressed, Hopeless 2 3 0 0   PHQ - 2 Score 5 6 0 0   Altered sleeping 1 0 - -   Tired, decreased energy 3 3 - -   Change in appetite 1 2 - -   Feeling bad or failure about yourself  3 3 - -   Trouble concentrating 2 0 - -   Moving slowly or fidgety/restless 0 0 - -   Suicidal thoughts 0 0 - -   PHQ-9 Score 15 14 - -   Difficult doing work/chores Very difficult Very difficult - -      Psychosocial Evaluation and Intervention:     Psychosocial Evaluation - 05/12/16 1011      Psychosocial Evaluation & Interventions   Interventions Stress management education;Encouraged to exercise with the program and follow exercise prescription   Comments Counselor met with Mr. Yorel RedderSam") and his spouse, Dorian Pod today for the initial psychosocial evaluation.  [name] is a 70 year old who has had multiple cardiac issues including quadruple bypass in 2011, and most recently was in [facility] hospital for 7 days.  This is his 4th time of being in a Cardiac Rehab program.  Inocente Salles has a strong support system with a spouse of 63 years and 2 adult children who live closeby with several adult  grandchildren as well.  He also has diabetes, sleep apnea and has struggled with prostate cancer  in the past.  [name] reports that he sleeps well with the help of his CPAP machine.  He admits to a history of depression and some minor current symptoms, and is being treated with Prozac 74m daily for this.  Counselor spoke with SInocente Sallesand EEstill Bambergabout the PHQ-9 score of 14 and reviewed it with them suggesting some concern about his current mood, which [name] states is generally positive.  He admits that his limitations in his ability to do more normal activities is impacting his mood and hopes with the help of this program that will improve.  His goals are to increase his stamina and strength; improve the circulation in his legs and lose some weight while in this program.  [name] & his wife travel often and stated they will be on vacation next week.  Counselor encouraged consistency in exercise whether here or on vacation.   Counselor will be following with [name] to see if he is making progress on these goals and if his mood is improving as a result.     Continued Psychosocial Services Needed Yes  [name] will benefit from the stress management educational component of this program, as well as meeting with the dietician to address his weight loss goals.        Psychosocial Re-Evaluation:     Psychosocial Re-Evaluation    [place] 07/07/16 1008             Psychosocial Re-Evaluation   Interventions Encouraged to attend Cardiac Rehabilitation for the exercise       Comments JValdemarwife is very knowledgable about his medical problems and she is very supportive. [name]  has c/o to his wife about being dizzy when he stands up this week.           Vocational Rehabilitation: Provide vocational rehab assistance to qualifying candidates.   Vocational Rehab Evaluation & Intervention:     Vocational Rehab - 04/28/16 1344      Initial Vocational Rehab Evaluation & Intervention   Assessment shows need for Vocational  Rehabilitation No      Education: Education Goals: Education classes will be provided on a weekly basis, covering required topics. Participant will state understanding/return demonstration of topics presented.  Learning Barriers/Preferences:     Learning Barriers/Preferences - 04/28/16 1342      Learning Barriers/Preferences   Learning Barriers Hearing;Exercise Concerns   Learning Preferences Skilled Demonstration      Education Topics: General Nutrition Guidelines/Fats and Fiber: -Group instruction provided by verbal, written material, models and posters to present the general guidelines for heart healthy nutrition. Gives an explanation and review of dietary fats and fiber. [facility]  Date  06/28/16 [Marcelina Morelattended 05/10/16]  Educator  CR  Instruction Review Code  2- meets goals/outcomes      Controlling Sodium/Reading Food Labels: -Group verbal and written material supporting the discussion of sodium use in heart healthy nutrition. Review and explanation with models, verbal and written materials for utilization of the food label.   Exercise Physiology & Risk Factors: - Group verbal and written instruction with models to review the exercise physiology of the cardiovascular system and associated critical values. Details cardiovascular disease risk factors and the goals associated with each risk factor.   Aerobic Exercise & Resistance Training: - Gives group verbal and written discussion on the health impact of inactivity. On the components of aerobic and resistive training programs and the benefits of this training and  how to safely progress through these programs. Flowsheet Row Cardiac Rehab from 07/26/2016 in [facility] Cardiac and Pulmonary Rehab  Date  05/24/16  Educator  [facility]  Instruction Review Code  2- meets goals/outcomes      Flexibility, Balance, General Exercise Guidelines: - Provides group verbal and  written instruction on the benefits of flexibility and balance training programs. Provides general exercise guidelines with specific guidelines to those with heart or lung disease. Demonstration and skill practice provided. Flowsheet Row Cardiac Rehab from 07/26/2016 in [facility] Cardiac and Pulmonary Rehab  Date  07/19/16  Educator  [facility]  Instruction Review Code  2- meets goals/outcomes      Stress Management: - Provides group verbal and written instruction about the health risks of elevated stress, cause of high stress, and healthy ways to reduce stress. Flowsheet Row Cardiac Rehab from 07/26/2016 in [facility] Cardiac and Pulmonary Rehab  Date  07/21/16  Educator  [facility]  Instruction Review Code  2- meets goals/outcomes      Depression: - Provides group verbal and written instruction on the correlation between heart/lung disease and depressed mood, treatment options, and the stigmas associated with seeking treatment. Flowsheet Row Cardiac Rehab from 07/26/2016 in [facility] Cardiac and Pulmonary Rehab  Date  06/30/16  Educator  [facility]  Instruction Review Code  2- meets goals/outcomes      Anatomy & Physiology of the Heart: - Group verbal and written instruction and models provide basic cardiac anatomy and physiology, with the coronary electrical and arterial systems. Review of: AMI, Angina, Valve disease, Heart Failure, Cardiac Arrhythmia, Pacemakers, and the ICD. Flowsheet Row Cardiac Rehab from 07/26/2016 in [facility] Cardiac and Pulmonary Rehab  Date  07/26/16  Educator  SB  Instruction Review Code  2- meets goals/outcomes      Cardiac Procedures: - Group verbal and written instruction and models to describe the testing methods done to diagnose heart disease. Reviews the outcomes of the test results. Describes the treatment choices: Medical Management, Angioplasty, or Coronary Bypass Surgery.   Cardiac Medications: - Group verbal and written instruction to review commonly prescribed medications for heart  disease. Reviews the medication, class of the drug, and side effects. Includes the steps to properly store meds and maintain the prescription regimen. Flowsheet Row Cardiac Rehab from 07/26/2016 in [facility] Cardiac and Pulmonary Rehab  Date  06/21/16  Educator  SB  Instruction Review Code  2- meets goals/outcomes [cardiac meds lecture 2]      Go Sex-Intimacy & Heart Disease, Get SMART - Goal Setting: - Group verbal and written instruction through game format to discuss heart disease and the return to sexual intimacy. Provides group verbal and written material to discuss and apply goal setting through the application of the S.M.A.R.T. Method.   Other Matters of the Heart: - Provides group verbal, written materials and models to describe Heart Failure, Angina, Valve Disease, and Diabetes in the realm of heart disease. Includes description of the disease process and treatment options available to the cardiac patient. Flowsheet Row Cardiac Rehab from 07/26/2016 in [facility] Cardiac and Pulmonary Rehab  Date  07/26/16  Educator  SB  Instruction Review Code  2- meets goals/outcomes      Exercise & Equipment Safety: - Individual verbal instruction and demonstration of equipment use and safety with use of the equipment. Flowsheet Row Cardiac Rehab from 07/26/2016 in [facility] Cardiac and Pulmonary Rehab  Date  04/28/16  Educator  D.W.  Instruction Review Code  1- partially meets, needs review/practice  Infection Prevention: - Provides verbal and written material to individual with discussion of infection control including proper hand washing and proper equipment cleaning during exercise session. Flowsheet Row Cardiac Rehab from 07/26/2016 in [facility] Cardiac and Pulmonary Rehab  Date  04/28/16  Educator  DW  Instruction Review Code  2- meets goals/outcomes      Falls Prevention: - Provides verbal and written material to individual with discussion of falls prevention and safety. Flowsheet Row Cardiac  Rehab from 07/26/2016 in [facility] Cardiac and Pulmonary Rehab  Date  04/28/16  Educator  DW  Instruction Review Code  2- meets goals/outcomes      Diabetes: - Individual verbal and written instruction to review signs/symptoms of diabetes, desired ranges of glucose level fasting, after meals and with exercise. Advice that pre and post exercise glucose checks will be done for 3 sessions at entry of program. Flowsheet Row Cardiac Rehab from 07/26/2016 in [facility] Cardiac and Pulmonary Rehab  Date  04/28/16  Educator  DW  Instruction Review Code  2- meets goals/outcomes       Knowledge Questionnaire Score:     Knowledge Questionnaire Score - 07/30/16 7915      Knowledge Questionnaire Score   Pre Score 22/28   Post Score 24/28      Core Components/Risk Factors/Patient Goals at Admission:     Personal Goals and Risk Factors at Admission - 04/28/16 1508      Core Components/Risk Factors/Patient Goals on Admission    Weight Management Weight Loss;Obesity   Sedentary Yes   Intervention Provide advice, education, support and counseling about physical activity/exercise needs.;Develop an individualized exercise prescription for aerobic and resistive training based on initial evaluation findings, risk stratification, comorbidities and participant's personal goals.   Expected Outcomes Achievement of increased cardiorespiratory fitness and enhanced flexibility, muscular endurance and strength shown through measurements of functional capacity and personal statement of participant.   Increase Strength and Stamina Yes   Intervention Provide advice, education, support and counseling about physical activity/exercise needs.;Develop an individualized exercise prescription for aerobic and resistive training based on initial evaluation findings, risk stratification, comorbidities and participant's personal goals.   Expected Outcomes Achievement of increased cardiorespiratory fitness and enhanced flexibility,  muscular endurance and strength shown through measurements of functional capacity and personal statement of participant.   Improve shortness of breath with ADL's Yes   Intervention Provide education, individualized exercise plan and daily activity instruction to help decrease symptoms of SOB with activities of daily living.   Expected Outcomes Short Term: Achieves a reduction of symptoms when performing activities of daily living.   Diabetes Yes   Intervention Provide education about proper nutrition, including hydration, and aerobic/resistive exercise prescription along with prescribed medications to achieve blood glucose in normal ranges: Fasting glucose 65-99 mg/dL;Provide education about signs/symptoms and action to take for hypo/hyperglycemia.   Expected Outcomes Short Term: Participant verbalizes understanding of the signs/symptoms and immediate care of hyper/hypoglycemia, proper foot care and importance of medication, aerobic/resistive exercise and nutrition plan for blood glucose control.;Long Term: Attainment of HbA1C < 7%.   Heart Failure Yes   Intervention Provide a combined exercise and nutrition program that is supplemented with education, support and counseling about heart failure. Directed toward relieving symptoms such as shortness of breath, decreased exercise tolerance, and extremity edema.   Expected Outcomes Improve functional capacity of life;Short term: Attendance in program 2-3 days a week with increased exercise capacity. Reported lower sodium intake. Reported increased fruit and vegetable intake. Reports medication compliance.;Short term: Daily  weights obtained and reported for increase. Utilizing diuretic protocols set by physician.;Long term: Adoption of self-care skills and reduction of barriers for early signs and symptoms recognition and intervention leading to self-care maintenance.   Hypertension Yes   Intervention Provide education on lifestyle modifcations including  regular physical activity/exercise, weight management, moderate sodium restriction and increased consumption of fresh fruit, vegetables, and low fat dairy, alcohol moderation, and smoking cessation.;Monitor prescription use compliance.   Expected Outcomes Short Term: Continued assessment and intervention until BP is < 140/37m HG in hypertensive participants. < 130/833mHG in hypertensive participants with diabetes, heart failure or chronic kidney disease.;Long Term: Maintenance of blood pressure at goal levels.   Lipids Yes   Intervention Provide education and support for participant on nutrition & aerobic/resistive exercise along with prescribed medications to achieve LDL <7035mHDL >16m60m Expected Outcomes Short Term: Participant states understanding of desired cholesterol values and is compliant with medications prescribed. Participant is following exercise prescription and nutrition guidelines.;Long Term: Cholesterol controlled with medications as prescribed, with individualized exercise RX and with personalized nutrition plan. Value goals: LDL < 70mg66mL > 40 mg.   Stress Yes   Intervention Offer individual and/or small group education and counseling on adjustment to heart disease, stress management and health-related lifestyle change. Teach and support self-help strategies.;Refer participants experiencing significant psychosocial distress to appropriate mental health specialists for further evaluation and treatment. When possible, include family members and significant others in education/counseling sessions.   Expected Outcomes Short Term: Participant demonstrates changes in health-related behavior, relaxation and other stress management skills, ability to obtain effective social support, and compliance with psychotropic medications if prescribed.;Long Term: Emotional wellbeing is indicated by absence of clinically significant psychosocial distress or social isolation.      Core Components/Risk  Factors/Patient Goals Review:      Goals and Risk Factor Review    Row Name 05/24/16 0955 06/21/16 1505 06/30/16 0843 07/07/16 1003 07/07/16 1014     Core Components/Risk Factors/Patient Goals Review   Personal Goals Review Weight Management/Obesity;Sedentary;Improve shortness of breath with ADL's;Increase Strength and Stamina;Heart Failure;Diabetes;Lipids;Hypertension;Stress Weight Management/Obesity;Sedentary;Increase Strength and Stamina;Hypertension;Lipids;Stress;Diabetes;Heart Failure;Other Heart Failure  - Weight Management/Obesity;Sedentary;Increase Strength and Stamina;Diabetes;Heart Failure;Hypertension;Lipids;Stress   Review [name] has been doing great coming to rehab.  His weight is trending down.  He is doing more lifestyle activities involving walking, but no home exercsie yet.  He is feeling better stamina wise, but still no improvement in his shortness of breath.  His blood sugars and blood pressures have been impoving as well.  He is handling his medications without any concerns.  His stress levels have improved as well. [name] has returned from a week at the beach.  He is doing well with exercise.  He did do some walking at the beach.  His weight is up some today, but overall had been trending down.  His lipids were doing better at last check.  His blood pressures are good.  He is weighing daily and watching his salt intake.  Stress is good and his shortness of breath has been improving. [name] was able to state the rules of heart failure management today.  He weighs daily and watches his salt intake.  He knows when to call MD or 911. I explained and wrote out [name]'s blood pressures today 78/50 with dizziness after exercises I explained that sometimes with extra water fluid it will teter toter his heart/blood pressure if I give him too much water.  [name] is doing well.  His weight  is back up today, but he is weighing daily.  His blood sugars have been in the 130s.  His blood pressure has been good  here, but he is not taking it at home.  His stress level is up some as he just found out that his son had a stress test and needs to see a cardiologist.  He is doing some walking on his off days.   Expected Outcomes He will continue to make progress across the board wiht continued exercise and education attendance. We will give [name] a heart failure zone sheet for home use.  He will continue to come to exercise and education classes.  We will continue to monitor for progress. [name] is able to stay on top of his heart failure and prevent exarcerbations. No dizziness.  [name] will continue to come to exercise and education classes.  He will continue to monitor his weight daily.   [place] Name 07/28/16 1027             Core Components/Risk Factors/Patient Goals Review   Personal Goals Review Weight Management/Obesity;Sedentary;Increase Strength and Stamina;Improve shortness of breath with ADL's;Heart Failure;Diabetes;Lipids;Hypertension;Stress       Review [name] has been coming to classes more consisitently.  Overall, his weight is trending down; however, today is was up again.  He is walking at home some and working in the yard.  He continues to have some SOB, but it has improved since being in the program.  HIs biggest stressor now is that he cannot get out in the heat and humidiity for the length of time that he used to and is needing to rely on help.  He has come off of his Metformin and blood sugar are doing well.  His blood pressures have been good and no problems with his statins.  He continues to monitor his heart failure symptoms at home.       Expected Outcomes [name] will be finishing the program soon.  We will continue to monitor his weight and blood pressures as we work on building up his stamina.  He has found success using PLB and we will continue to encourage him to use this technique at home as well.          Core Components/Risk Factors/Patient Goals at Discharge (Final Review):      Goals  and Risk Factor Review - 07/28/16 1027      Core Components/Risk Factors/Patient Goals Review   Personal Goals Review Weight Management/Obesity;Sedentary;Increase Strength and Stamina;Improve shortness of breath with ADL's;Heart Failure;Diabetes;Lipids;Hypertension;Stress   Review [name] has been coming to classes more consisitently.  Overall, his weight is trending down; however, today is was up again.  He is walking at home some and working in the yard.  He continues to have some SOB, but it has improved since being in the program.  HIs biggest stressor now is that he cannot get out in the heat and humidiity for the length of time that he used to and is needing to rely on help.  He has come off of his Metformin and blood sugar are doing well.  His blood pressures have been good and no problems with his statins.  He continues to monitor his heart failure symptoms at home.   Expected Outcomes [name] will be finishing the program soon.  We will continue to monitor his weight and blood pressures as we work on building up his stamina.  He has found success using PLB and we will continue to encourage him  to use this technique at home as well.      ITP Comments:     ITP Comments    Row Name 04/28/16 1706 04/28/16 1736 05/26/16 0737 06/23/16 0735 07/07/16 0958   ITP Comments Jeneen Rinks "[name]" presented today for Cardiac Rehab Orientation.  He has a long history of CAD.  S/P MI in 1990. 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  He has had one stent placed in bypass graft approximately a month or two after CABG.  He reports taking Nitroglycerin SL when he has GERD symptoms of burning  in the middle of his chest and throat.  He only takes one per pt. and it always relieves his GERD.  [name] stated MD is aware.  He also reports taking one nitroglycerin when he has tingling in his left chest.  One nitroglycerin relieves this tingling feeling he gets from time to  time.  He says if he gets GERD or tingling in his left chest he will stop what he is doing and take nitroglycerin.  The most he has taken in one day according to patient is 5 tablets.  RN will reach out to [facility] to make her aware of the Nitroglycerin use.  Wife, Dorian Pod, accompanied patient to orientation.  Ellen's cell number is:  [phone]; Home [phone]; [name]'s Cell [phone].  [name]'s main goal in coming to Cardiac Rehab is increase stamina; decrease SOB; weight loss.  [name] states he would like to weigh less than 200 pounds.  Currently his weight is 250.5 pounds.  [name] will start Cardiac Rehab on Monday morning June 5th at 7:30 a.m.  He will be out of town June 6th - 16th; July 9th - 14th; August 6th - 11th; September 10th - 15th.     [place] "[name]" presented today for Cardiac Rehab Orientation.  He has a long history of CAD.  S/P MI in 1990, 1994, 1997.  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  He has had one stent placed in bypass graft approximately a month or two after CABG.  He reports taking Nitroglycerin SL when he has GERD symptoms of burning  in the middle of his chest and throat.  He only takes one per pt. and it always relieves his GERD.  [name] stated MD is aware.  He also reports taking one nitroglycerin when he has tingling in his left chest.  One nitroglycerin relieves this tingling feeling he gets from time to time.  He says if he gets GERD or tingling in his left chest he will stop what he is doing and take nitroglycerin.  The most he has taken in one day according to patient is 5 tablets.  RN will reach out to [facility] to make her aware of the Nitroglycerin use.  Wife, Dorian Pod, accompanied patient to orientation.  Ellen's cell number is:  [phone]; Home [phone]; [name]'s Cell [phone].  [name]'s main goal in coming to Cardiac Rehab is increase stamina; decrease SOB; weight loss.  [name] states he would like to weigh less than 200 pounds.   Currently his weight is 250.5 pounds.  [name] will start Cardiac Rehab on Monday morning June 5th at 7:30 a.m.  He will be out of town June 6th - 16th; July 9th - 14th; August 6th - 11th; September 10th - 15th.  30 day review.  Continue with ITP 30 day review. Continue with ITP I called and spoke to Dr. Laverna Peace nurse about "[name]" Drue Stager. re; after exercise and relaxaton his blood pressure as 78/50. Given 240cc of water times 2 and his blood pressure came up to 90/70 but he was still dizzy. I asked Isaish Alemu" if he was on a fluid restriction and he said he didn't know of any. I gave him approx 200cc more of water and his blood pressure dropped to 80/50. We had him sit here for 10 minutes and dhis blood pressure came up to 104/58 without dizziness. Later I spoke to his wife and she said this week [name] has been c/o being dizzy when he stands up. His wife will call back the Nurse and will ask if he is suppose to be on any type of fluid restriction and about his current medicines which he is taking.    [place] Name 07/07/16 1005 07/21/16 0757 08/06/16 1027       ITP Comments [name] also said that "this thing is not working and I won't go back to the doctor till Sept. 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  30 day review. Continue with ITP unless changes noted by Medical Director at signature of review. Called to check on patient.  He had been admitted to [facility] for acute heart failure.  He was planning to graduate today due to vacation plans.  After talking with him, he request to go ahead and discharge from the program today.  He had already completed his post test.  He plans to continue to exercise by going to the mall to walk.        Comments: Discharge ITP

## 2016-08-06 NOTE — Progress Notes (Signed)
Discharge Summary  Patient Details  Name: [name] MRN: [id number] Date of Birth: 1946-08-14 Referring Provider:   Flowsheet Row Cardiac Rehab from 04/28/2016 in [place] [facility] Cardiac and Pulmonary Rehab  Referring Provider  Delora Fuel MD       Number of Visits: 31  Reason for Discharge:  Patient reached a stable level of exercise. Patient independent in their exercise.  Graduated a little early as he is leaving for vacation.  Smoking History:  History  Smoking Status  . Former Smoker  . Packs/day: 2.00  . Years: 30.00  . Quit date: 12/03/1996  Smokeless Tobacco  . Never Used    Diagnosis:  NSTEMI (non-ST elevated myocardial infarction) ([place])  Chronic systolic heart failure ([place])  ADL [facility]:   Initial Exercise Prescription:     Initial Exercise Prescription - 04/28/16 1500      Date of Initial Exercise RX and Referring Provider   Date 04/28/16   Referring Provider Delora Fuel MD     Treadmill   MPH 1   Grade 0   Minutes 10  5/5   METs 1.7     Bike   Level 0.4   Watts 20   Minutes 15   METs 1.6     Recumbant Bike   Level 1   RPM 50   Watts 20   Minutes 15   METs 1.6     NuStep   Level 1   Watts 20   Minutes 15   METs 1.6     Arm Ergometer   Level 1   Watts 20   Minutes 15   METs 1.6     Arm/Foot Ergometer   Level 1   Watts 20   Minutes 15   METs 1.6     Cybex   Level 1   RPM 50   Minutes 15   METs 1.6     Recumbant Elliptical   Level 1   RPM 50   Watts 20   Minutes 15   METs 1.6     Elliptical   Level 1   Speed 50   Minutes 15   METs 1.7     REL-XR   Level 1   Watts 20   Minutes 15   METs 1.6     T5 Nustep   Level 1   Watts 20   Minutes 15   METs 1.6     Biostep-RELP   Level 1   Watts 20   Minutes 15   METs 1.6     Prescription Details   Frequency (times per week) 3   Duration Progress to 30 minutes of continuous aerobic without signs/symptoms of physical distress     Intensity   THRR  40-80% of Max Heartrate 100-133   Ratings of Perceived Exertion 11-13   Perceived Dyspnea 0-4     Progression   Progression Continue to progress workloads to maintain intensity without signs/symptoms of physical distress.     Resistance Training   Training Prescription Yes   Weight 2lbs   Reps 10-12      Discharge Exercise Prescription (Final Exercise Prescription Changes):     Exercise Prescription Changes - 07/29/16 1400      Exercise Review   Progression Yes     Response to Exercise   Blood Pressure (Admit) 128/60   Blood Pressure (Exercise) 146/70   Blood Pressure (Exit) 116/64   Heart Rate (Admit) 86 bpm   Heart Rate (Exercise) 108 bpm  Heart Rate (Exit) 84 bpm   Rating of Perceived Exertion (Exercise) 14   Symptoms none   Comments Home Exercise Guidelines given 06/30/16   Duration Progress to 45 minutes of aerobic exercise without signs/symptoms of physical distress   Intensity THRR unchanged     Progression   Progression Continue to progress workloads to maintain intensity without signs/symptoms of physical distress.   Average METs 2.46     Resistance Training   Training Prescription Yes   Weight 4 lbs   Reps 10-15     Interval Training   Interval Training No     Treadmill   MPH 1   Grade 0.5   Minutes 15   METs 2     NuStep   Level 6   Minutes 15   METs 2.4     Biostep-RELP   Level 2   Minutes 15   METs 2     Home Exercise Plan   Plans to continue exercise at Home  walking   Frequency Add 2 additional days to program exercise sessions.      Functional Capacity:     6 Minute Walk    Row Name 04/28/16 1449 07/30/16 0947       6 Minute Walk   Phase Initial Discharge    Distance 890 feet 902 feet    Distance % Change  - 1.3 %    Walk Time 6 minutes 5.98 minutes    # of Rest Breaks 0 3  10 sec, 27 sec, 24 sec    MPH 1.69 2.06    METS 1.62 1.57    RPE 17 15    Perceived Dyspnea   - 5    VO2 Peak 5.67 5.49    Symptoms Yes  (comment) Yes (comment)    Comments Neuropathy, claudication, 2/10 chest tightness (possibly breathing) Shortness of Breath    Resting HR 68 bpm 81 bpm    Resting BP 112/60 130/66    Max Ex. HR 110 bpm 101 bpm    Max Ex. BP 138/64 134/60      Interval Oxygen   Interval Oxygen? Yes  -    Baseline Oxygen Saturation % 98 %  -    Baseline Liters of Oxygen 0 L  Room Air  -    1 Minute Oxygen Saturation % 98 %  -    1 Minute Liters of Oxygen 0 L  Room Air  -    3 Minute Oxygen Saturation % 95 %  -    3 Minute Liters of Oxygen 0 L  -    5 Minute Oxygen Saturation % 91 %  -    5 Minute Liters of Oxygen 0 L  -    6 Minute Oxygen Saturation % 91 %  -    6 Minute Liters of Oxygen 0 L  -       Psychological, QOL, Others - Outcomes: PHQ 2/9: Depression screen [facility] 2/9 07/30/2016 04/28/2016 04/05/2016 02/13/2016  Decreased Interest 3 3 0 0  Down, Depressed, Hopeless 2 3 0 0  PHQ - 2 Score 5 6 0 0  Altered sleeping 1 0 - -  Tired, decreased energy 3 3 - -  Change in appetite 1 2 - -  Feeling bad or failure about yourself  3 3 - -  Trouble concentrating 2 0 - -  Moving slowly or fidgety/restless 0 0 - -  Suicidal thoughts 0 0 - -  PHQ-9 Score 15  14 - -  Difficult doing work/chores Very difficult Very difficult - -    Quality of Life:     Quality of Life - 07/30/16 0953      Quality of Life Scores   Health/Function Pre 13.3 %   Health/Function Post 12.37 %   Health/Function % Change -6.99 %   Socioeconomic Pre 18.57 %   Socioeconomic Post 21.67 %   Socioeconomic % Change  16.69 %   Psych/Spiritual Pre 16.79 %   Psych/Spiritual Post 17.5 %   Psych/Spiritual % Change 4.23 %   Family Pre 25.6 %   Family Post 20.8 %   Family % Change -18.75 %   GLOBAL Pre 16.91 %   GLOBAL Post 16.42 %   GLOBAL % Change -2.9 %      Personal Goals: Goals established at orientation with interventions provided to work toward goal.     Personal Goals and Risk Factors at Admission - 04/28/16 1508       Core Components/Risk Factors/Patient Goals on Admission    Weight Management Weight Loss;Obesity   Sedentary Yes   Intervention Provide advice, education, support and counseling about physical activity/exercise needs.;Develop an individualized exercise prescription for aerobic and resistive training based on initial evaluation findings, risk stratification, comorbidities and participant's personal goals.   Expected Outcomes Achievement of increased cardiorespiratory fitness and enhanced flexibility, muscular endurance and strength shown through measurements of functional capacity and personal statement of participant.   Increase Strength and Stamina Yes   Intervention Provide advice, education, support and counseling about physical activity/exercise needs.;Develop an individualized exercise prescription for aerobic and resistive training based on initial evaluation findings, risk stratification, comorbidities and participant's personal goals.   Expected Outcomes Achievement of increased cardiorespiratory fitness and enhanced flexibility, muscular endurance and strength shown through measurements of functional capacity and personal statement of participant.   Improve shortness of breath with ADL's Yes   Intervention Provide education, individualized exercise plan and daily activity instruction to help decrease symptoms of SOB with activities of daily living.   Expected Outcomes Short Term: Achieves a reduction of symptoms when performing activities of daily living.   Diabetes Yes   Intervention Provide education about proper nutrition, including hydration, and aerobic/resistive exercise prescription along with prescribed medications to achieve blood glucose in normal ranges: Fasting glucose 65-99 mg/dL;Provide education about signs/symptoms and action to take for hypo/hyperglycemia.   Expected Outcomes Short Term: Participant verbalizes understanding of the signs/symptoms and immediate care of  hyper/hypoglycemia, proper foot care and importance of medication, aerobic/resistive exercise and nutrition plan for blood glucose control.;Long Term: Attainment of HbA1C < 7%.   Heart Failure Yes   Intervention Provide a combined exercise and nutrition program that is supplemented with education, support and counseling about heart failure. Directed toward relieving symptoms such as shortness of breath, decreased exercise tolerance, and extremity edema.   Expected Outcomes Improve functional capacity of life;Short term: Attendance in program 2-3 days a week with increased exercise capacity. Reported lower sodium intake. Reported increased fruit and vegetable intake. Reports medication compliance.;Short term: Daily weights obtained and reported for increase. Utilizing diuretic protocols set by physician.;Long term: Adoption of self-care skills and reduction of barriers for early signs and symptoms recognition and intervention leading to self-care maintenance.   Hypertension Yes   Intervention Provide education on lifestyle modifcations including regular physical activity/exercise, weight management, moderate sodium restriction and increased consumption of fresh fruit, vegetables, and low fat dairy, alcohol moderation, and smoking cessation.;Monitor prescription use compliance.  Expected Outcomes Short Term: Continued assessment and intervention until BP is < 140/53mm HG in hypertensive participants. < 130/66mm HG in hypertensive participants with diabetes, heart failure or chronic kidney disease.;Long Term: Maintenance of blood pressure at goal levels.   Lipids Yes   Intervention Provide education and support for participant on nutrition & aerobic/resistive exercise along with prescribed medications to achieve LDL 70mg , HDL >40mg .   Expected Outcomes Short Term: Participant states understanding of desired cholesterol values and is compliant with medications prescribed. Participant is following exercise  prescription and nutrition guidelines.;Long Term: Cholesterol controlled with medications as prescribed, with individualized exercise RX and with personalized nutrition plan. Value goals: LDL < 70mg , HDL > 40 mg.   Stress Yes   Intervention Offer individual and/or small group education and counseling on adjustment to heart disease, stress management and health-related lifestyle change. Teach and support self-help strategies.;Refer participants experiencing significant psychosocial distress to appropriate mental health specialists for further evaluation and treatment. When possible, include family members and significant others in education/counseling sessions.   Expected Outcomes Short Term: Participant demonstrates changes in health-related behavior, relaxation and other stress management skills, ability to obtain effective social support, and compliance with psychotropic medications if prescribed.;Long Term: Emotional wellbeing is indicated by absence of clinically significant psychosocial distress or social isolation.       Personal Goals Discharge:     Goals and Risk Factor Review    Row Name 05/24/16 0955 06/21/16 1505 06/30/16 0843 07/07/16 1003 07/07/16 1014     Core Components/Risk Factors/Patient Goals Review   Personal Goals Review Weight Management/Obesity;Sedentary;Improve shortness of breath with ADL's;Increase Strength and Stamina;Heart Failure;Diabetes;Lipids;Hypertension;Stress Weight Management/Obesity;Sedentary;Increase Strength and Stamina;Hypertension;Lipids;Stress;Diabetes;Heart Failure;Other Heart Failure  - Weight Management/Obesity;Sedentary;Increase Strength and Stamina;Diabetes;Heart Failure;Hypertension;Lipids;Stress   Review [name] has been doing great coming to rehab.  His weight is trending down.  He is doing more lifestyle activities involving walking, but no home exercsie yet.  He is feeling better stamina wise, but still no improvement in his shortness of breath.  His  blood sugars and blood pressures have been impoving as well.  He is handling his medications without any concerns.  His stress levels have improved as well. [name] has returned from a week at the beach.  He is doing well with exercise.  He did do some walking at the beach.  His weight is up some today, but overall had been trending down.  His lipids were doing better at last check.  His blood pressures are good.  He is weighing daily and watching his salt intake.  Stress is good and his shortness of breath has been improving. [name] was able to state the rules of heart failure management today.  He weighs daily and watches his salt intake.  He knows when to call MD or 911. I explained and wrote out [name]'s blood pressures today 78/50 with dizziness after exercises I explained that sometimes with extra water fluid it will teter toter his heart/blood pressure if I give him too much water.  [name] is doing well.  His weight is back up today, but he is weighing daily.  His blood sugars have been in the 130s.  His blood pressure has been good here, but he is not taking it at home.  His stress level is up some as he just found out that his son had a stress test and needs to see a cardiologist.  He is doing some walking on his off days.   Expected Outcomes He will continue to  make progress across the board wiht continued exercise and education attendance. We will give [name] a heart failure zone sheet for home use.  He will continue to come to exercise and education classes.  We will continue to monitor for progress. [name] is able to stay on top of his heart failure and prevent exarcerbations. No dizziness.  [name] will continue to come to exercise and education classes.  He will continue to monitor his weight daily.   [place] Name 07/28/16 1027             Core Components/Risk Factors/Patient Goals Review   Personal Goals Review Weight Management/Obesity;Sedentary;Increase Strength and Stamina;Improve shortness of breath with  ADL's;Heart Failure;Diabetes;Lipids;Hypertension;Stress       Review [name] has been coming to classes more consisitently.  Overall, his weight is trending down; however, today is was up again.  He is walking at home some and working in the yard.  He continues to have some SOB, but it has improved since being in the program.  HIs biggest stressor now is that he cannot get out in the heat and humidiity for the length of time that he used to and is needing to rely on help.  He has come off of his Metformin and blood sugar are doing well.  His blood pressures have been good and no problems with his statins.  He continues to monitor his heart failure symptoms at home.       Expected Outcomes [name] will be finishing the program soon.  We will continue to monitor his weight and blood pressures as we work on building up his stamina.  He has found success using PLB and we will continue to encourage him to use this technique at home as well.          Nutrition & Weight - Outcomes:     Pre Biometrics - 04/28/16 1507      Pre Biometrics   Height 5' 7.25" (1.708 m)   Weight 250 lb 4.8 oz (113.5 kg)   Waist Circumference 46 inches   Hip Circumference 45.5 inches   Waist to Hip Ratio 1.01 %   BMI (Calculated) 39       Nutrition:   Nutrition Discharge:     Nutrition Assessments - 07/30/16 0951      Rate Your Plate Scores   Pre Score 73   Pre Score % 81 %   Post Score 71      Education Questionnaire Score:     Knowledge Questionnaire Score - 07/30/16 0952      Knowledge Questionnaire Score   Pre Score 22/28   Post Score 24/28      Goals reviewed with patient; copy given to patient.

## 2016-08-12 DIAGNOSIS — I5023 Acute on chronic systolic (congestive) heart failure: Secondary | ICD-10-CM | POA: Diagnosis not present

## 2016-08-13 DIAGNOSIS — C61 Malignant neoplasm of prostate: Secondary | ICD-10-CM | POA: Diagnosis not present

## 2016-08-18 ENCOUNTER — Encounter: Payer: Self-pay | Admitting: *Deleted

## 2016-08-18 DIAGNOSIS — I5022 Chronic systolic (congestive) heart failure: Secondary | ICD-10-CM

## 2016-08-18 DIAGNOSIS — I214 Non-ST elevation (NSTEMI) myocardial infarction: Secondary | ICD-10-CM

## 2016-08-18 NOTE — Progress Notes (Signed)
Cardiac Individual Treatment Plan  Patient Details  Name: [name] MRN: [id number] Date of Birth: 28-Nov-1946 Referring Provider:   Flowsheet Row Cardiac Rehab from 04/28/2016 in [facility] Cardiac and Pulmonary Rehab  Referring Provider  Delora Fuel MD      Initial Encounter Date:  Flowsheet Row Cardiac Rehab from 04/28/2016 in [facility] Cardiac and Pulmonary Rehab  Date  04/28/16  Referring Provider  Delora Fuel MD      Visit Diagnosis: NSTEMI (non-ST elevated myocardial infarction) ([place])  Chronic systolic heart failure ([place])  Patient's Home Medications on Admission:  Current Outpatient Prescriptions:  .  acetaminophen (TYLENOL) 325 MG tablet, Take 2 tablets (650 mg total) by mouth every 6 (six) hours as needed for mild pain (or Fever >/= 101)., Disp: , Rfl:  .  albuterol (PROVENTIL HFA;VENTOLIN HFA) 108 (90 Base) MCG/ACT inhaler, Inhale 2 puffs into the lungs 3 (three) times daily as needed for wheezing or shortness of breath., Disp: , Rfl:  .  albuterol-ipratropium (COMBIVENT) 18-103 MCG/ACT inhaler, Inhale 1 puff into the lungs 2 times daily at 12 noon and 4 pm., Disp: , Rfl:  .  aspirin 81 MG tablet, Take 81 mg by mouth every morning., Disp: , Rfl:  .  atorvastatin (LIPITOR) 40 MG tablet, Take 80 mg by mouth every evening. , Disp: , Rfl:  .  budesonide-formoterol (SYMBICORT) 80-4.5 MCG/ACT inhaler, Inhale 2 puffs into the lungs 2 (two) times daily., Disp: , Rfl:  .  clopidogrel (PLAVIX) 75 MG tablet, Take 75 mg by mouth every morning., Disp: , Rfl:  .  dexlansoprazole (DEXILANT) 60 MG capsule, Take 60 mg by mouth daily. Reported on 04/28/2016, Disp: , Rfl:  .  ferrous sulfate 325 (65 FE) MG tablet, Take 325 mg by mouth 2 (two) times daily with a meal., Disp: , Rfl:  .  finasteride (PROSCAR) 5 MG tablet, Take 5 mg by mouth at bedtime., Disp: , Rfl:  .  FLUoxetine (PROZAC) 20 MG tablet, Take 20 mg by mouth every morning., Disp: , Rfl:  .  furosemide (LASIX) 40 MG tablet, Take 1  tablet (40 mg total) by mouth 2 (two) times daily., Disp: 60 tablet, Rfl: 2 .  gabapentin (NEURONTIN) 300 MG capsule, Take 600 mg by mouth 3 (three) times daily., Disp: , Rfl:  .  insulin glargine (LANTUS) 100 UNIT/ML injection, Inject 0.75 mLs (75 Units total) into the skin at bedtime. (Patient taking differently: Inject 60 Units into the skin at bedtime. ), Disp: 10 mL, Rfl: 11 .  lisinopril (PRINIVIL,ZESTRIL) 20 MG tablet, Take 20 mg by mouth daily., Disp: , Rfl:  .  loratadine (CLARITIN) 10 MG tablet, Take 10 mg by mouth daily., Disp: , Rfl:  .  metFORMIN (GLUCOPHAGE) 1000 MG tablet, Take 1,000 mg by mouth 2 (two) times daily with a meal. , Disp: , Rfl:  .  metoprolol (LOPRESSOR) 50 MG tablet, Take 50 mg by mouth 2 (two) times daily., Disp: , Rfl:  .  Multiple Vitamins-Minerals (CENTRUM SILVER PO), Take 1 tablet by mouth every morning., Disp: , Rfl:  .  nitroGLYCERIN (NITROSTAT) 0.4 MG SL tablet, Place 0.4 mg under the tongue every 5 (five) minutes as needed for chest pain., Disp: , Rfl:  .  potassium chloride SA (KLOR-CON M20) 20 MEQ tablet, Take 1 tablet (20 mEq total) by mouth daily., Disp: 30 tablet, Rfl: 0 .  ranolazine (RANEXA) 500 MG 12 hr tablet, Take 500 mg by mouth 2 (two) times daily., Disp: , Rfl:  .  tamsulosin (FLOMAX) 0.4 MG CAPS capsule, Take 0.4 mg by mouth daily after supper., Disp: , Rfl:   Past Medical History: Past Medical History:  Diagnosis Date  . Anemia   . Cancer ([place]) 12/2013   prostate  . Cardiogenic pulmonary edema ([place]) 12/19/2014  . Cardiomyopathy, ischemic   . CHF (congestive heart failure) ([place])   . COPD (chronic obstructive pulmonary disease) ([place])   . Diabetes mellitus without complication ([place])   . GERD (gastroesophageal reflux disease)   . Hypercholesteremia   . Hypertension   . Myocardial infarction ([place]) [id number]  . Shortness of breath dyspnea     Tobacco Use: History  Smoking Status  . Former Smoker  . Packs/day: 2.00  . Years: 30.00   . Quit date: 12/03/1996  Smokeless Tobacco  . Never Used    Labs: Recent Review Flowsheet Data    Labs for ITP Cardiac and Pulmonary Rehab Latest Ref Rng & Units 01/05/2014 01/31/2016   Cholestrol 0 - 200 mg/dL 140 -   LDLCALC 0 - 100 mg/dL 86 -   HDL 40 - 60 mg/dL 33(L) -   Trlycerides 0 - 200 mg/dL 103 -   HCO3 21.0 - 28.0 mEq/L - 20.2(L)   ACIDBASEDEF 0.0 - 2.0 mmol/L - 4.4(H)   O2SAT % - 92.4       Exercise Target Goals:    Exercise Program Goal: Individual exercise prescription set with THRR, safety & activity barriers. Participant demonstrates ability to understand and report RPE using BORG scale, to self-measure pulse accurately, and to acknowledge the importance of the exercise prescription.  Exercise Prescription Goal: Starting with aerobic activity 30 plus minutes a day, 3 days per week for initial exercise prescription. Provide home exercise prescription and guidelines that participant acknowledges understanding prior to discharge.  Activity Barriers & Risk Stratification:   6 Minute Walk:     6 Minute Walk    Row Name 07/30/16 0947         6 Minute Walk   Phase Discharge     Distance 902 feet     Distance % Change 1.3 %     Walk Time 5.98 minutes     # of Rest Breaks 3  10 sec, 27 sec, 24 sec     MPH 2.06     METS 1.57     RPE 15     Perceived Dyspnea  5     VO2 Peak 5.49     Symptoms Yes (comment)     Comments Shortness of Breath     Resting HR 81 bpm     Resting BP 130/66     Max Ex. HR 101 bpm     Max Ex. BP 134/60        Initial Exercise Prescription:   Perform Capillary Blood Glucose checks as needed.  Exercise Prescription Changes:     Exercise Prescription Changes    Row Name 06/30/16 0800 07/15/16 1100 07/29/16 1400         Exercise Review   Progression Yes Yes Yes       Response to Exercise   Blood Pressure (Admit) 108/52 126/64 128/60     Blood Pressure (Exercise) 104/42 112/52 146/70     Blood Pressure (Exit) 134/60  110/60 116/64     Heart Rate (Admit) 73 bpm 85 bpm 86 bpm     Heart Rate (Exercise) 116 bpm 104 bpm 108 bpm     Heart Rate (Exit) 80 bpm 84 bpm 84  bpm     Rating of Perceived Exertion (Exercise) 13 13 14      Symptoms none none none     Comments Home Exercise Guidelines given 06/30/16 Home Exercise Guidelines given 06/30/16 Home Exercise Guidelines given 06/30/16     Duration Progress to 45 minutes of aerobic exercise without signs/symptoms of physical distress Progress to 45 minutes of aerobic exercise without signs/symptoms of physical distress Progress to 45 minutes of aerobic exercise without signs/symptoms of physical distress     Intensity THRR unchanged THRR unchanged THRR unchanged       Progression   Progression Continue to progress workloads to maintain intensity without signs/symptoms of physical distress. Continue to progress workloads to maintain intensity without signs/symptoms of physical distress. Continue to progress workloads to maintain intensity without signs/symptoms of physical distress.     Average METs 2.13 2.1 2.46       Resistance Training   Training Prescription Yes Yes Yes     Weight 2lbs 3 lbs 4 lbs     Reps 10-12 10-12 10-15       Interval Training   Interval Training No No No       Treadmill   MPH 1.2 1.2 1     Grade 0.5 0.5 0.5     Minutes 15 15 15      METs 2 2 2        NuStep   Level 6 6 6      Minutes 15 15 15      METs 2.4 2.3 2.4       Biostep-RELP   Level 2 2 2      Minutes 15 15 15      METs 2 2 2        Home Exercise Plan   Plans to continue exercise at Home  walking Home  walking Home  walking     Frequency Add 2 additional days to program exercise sessions. Add 2 additional days to program exercise sessions. Add 2 additional days to program exercise sessions.        Exercise Comments:     Exercise Comments    Row Name 06/30/16 [phone] 06/30/16 1101 07/15/16 1102 07/29/16 1441 07/30/16 0949   Exercise Comments Reviewed home exercise with  pt today.  Pt plans to walk at home for exercise.  Reviewed THR, pulse, RPE, sign and symptoms, NTG use, and when to call 911 or MD.  Also discussed weather considerations and indoor options.  Pt voiced understanding. [name] is doing well with exercise.  We discussed his home exercise today.  We wil continue to work with him on progression. [name] is doing well with exercise.  He is starting to walk at home some.   We will continue to monitor. [name] continues to improve with exercise.  He is walking at home and is eager to get back to beach to walk again.  He will be doing his post walk test at his next visit.  We will continue to monitor for progression. Reviewed METs average and discussed progression with pt today.  [name] completed his post 6 min walk today.  He only increased by 12 ft as he walked faster and needed rest breaks for breathing.      Discharge Exercise Prescription (Final Exercise Prescription Changes):     Exercise Prescription Changes - 07/29/16 1400      Exercise Review   Progression Yes     Response to Exercise   Blood Pressure (Admit) 128/60   Blood Pressure (Exercise) 146/70  Blood Pressure (Exit) 116/64   Heart Rate (Admit) 86 bpm   Heart Rate (Exercise) 108 bpm   Heart Rate (Exit) 84 bpm   Rating of Perceived Exertion (Exercise) 14   Symptoms none   Comments Home Exercise Guidelines given 06/30/16   Duration Progress to 45 minutes of aerobic exercise without signs/symptoms of physical distress   Intensity THRR unchanged     Progression   Progression Continue to progress workloads to maintain intensity without signs/symptoms of physical distress.   Average METs 2.46     Resistance Training   Training Prescription Yes   Weight 4 lbs   Reps 10-15     Interval Training   Interval Training No     Treadmill   MPH 1   Grade 0.5   Minutes 15   METs 2     NuStep   Level 6   Minutes 15   METs 2.4     Biostep-RELP   Level 2   Minutes 15   METs 2      Home Exercise Plan   Plans to continue exercise at Home  walking   Frequency Add 2 additional days to program exercise sessions.      Nutrition:  Target Goals: Understanding of nutrition guidelines, daily intake of sodium 1500mg , cholesterol 200mg , calories 30% from fat and 7% or less from saturated fats, daily to have 5 or more servings of fruits and vegetables.  Biometrics:    Nutrition Therapy Plan and Nutrition Goals:   Nutrition Discharge: Rate Your Plate Scores:     Nutrition Assessments - 07/30/16 0951      Rate Your Plate Scores   Pre Score 73   Pre Score % 81 %   Post Score 71      Nutrition Goals Re-Evaluation:     Nutrition Goals Re-Evaluation    Row Name 07/07/16 1009             Personal Goal #1 Re-Evaluation   Personal Goal #1 -  ? asked nurse if he is suppose to be on fluid restriction.           Psychosocial: Target Goals: Acknowledge presence or absence of depression, maximize coping skills, provide positive support system. Participant is able to verbalize types and ability to use techniques and skills needed for reducing stress and depression.  Initial Review & Psychosocial Screening:   Quality of Life Scores:     Quality of Life - 07/30/16 0953      Quality of Life Scores   Health/Function Pre 13.3 %   Health/Function Post 12.37 %   Health/Function % Change -6.99 %   Socioeconomic Pre 18.57 %   Socioeconomic Post 21.67 %   Socioeconomic % Change  16.69 %   Psych/Spiritual Pre 16.79 %   Psych/Spiritual Post 17.5 %   Psych/Spiritual % Change 4.23 %   Family Pre 25.6 %   Family Post 20.8 %   Family % Change -18.75 %   GLOBAL Pre 16.91 %   GLOBAL Post 16.42 %   GLOBAL % Change -2.9 %      PHQ-9: Recent Review Flowsheet Data    Depression screen [facility] 2/9 07/30/2016 04/28/2016 04/05/2016 02/13/2016   Decreased Interest 3 3 0 0   Down, Depressed, Hopeless 2 3 0 0   PHQ - 2 Score 5 6 0 0   Altered sleeping 1 0 - -   Tired,  decreased energy 3 3 - -   Change in  appetite 1 2 - -   Feeling bad or failure about yourself  3 3 - -   Trouble concentrating 2 0 - -   Moving slowly or fidgety/restless 0 0 - -   Suicidal thoughts 0 0 - -   PHQ-9 Score 15 14 - -   Difficult doing work/chores Very difficult Very difficult - -      Psychosocial Evaluation and Intervention:   Psychosocial Re-Evaluation:     Psychosocial Re-Evaluation    [place] Name 07/07/16 1008             Psychosocial Re-Evaluation   Interventions Encouraged to attend Cardiac Rehabilitation for the exercise       Comments Anvith wife is very knowledgable about his medical problems and she is very supportive. [name]  has c/o to his wife about being dizzy when he stands up this week.           Vocational Rehabilitation: Provide vocational rehab assistance to qualifying candidates.   Vocational Rehab Evaluation & Intervention:   Education: Education Goals: Education classes will be provided on a weekly basis, covering required topics. Participant will state understanding/return demonstration of topics presented.  Learning Barriers/Preferences:   Education Topics: General Nutrition Guidelines/Fats and Fiber: -Group instruction provided by verbal, written material, models and posters to present the general guidelines for heart healthy nutrition. Gives an explanation and review of dietary fats and fiber. Flowsheet Row Cardiac Rehab from 07/26/2016 in [facility] Cardiac and Pulmonary Rehab  Date  06/28/16 Marcelina Morel attended 05/10/16]  Educator  CR  Instruction Review Code  2- meets goals/outcomes      Controlling Sodium/Reading Food Labels: -Group verbal and written material supporting the discussion of sodium use in heart healthy nutrition. Review and explanation with models, verbal and written materials for utilization of the food label.   Exercise Physiology & Risk Factors: - Group verbal and written instruction with models to review the exercise  physiology of the cardiovascular system and associated critical values. Details cardiovascular disease risk factors and the goals associated with each risk factor.   Aerobic Exercise & Resistance Training: - Gives group verbal and written discussion on the health impact of inactivity. On the components of aerobic and resistive training programs and the benefits of this training and how to safely progress through these programs. Flowsheet Row Cardiac Rehab from 07/26/2016 in [facility] Cardiac and Pulmonary Rehab  Date  05/24/16  Educator  [facility]  Instruction Review Code  2- meets goals/outcomes      Flexibility, Balance, General Exercise Guidelines: - Provides group verbal and written instruction on the benefits of flexibility and balance training programs. Provides general exercise guidelines with specific guidelines to those with heart or lung disease. Demonstration and skill practice provided. Flowsheet Row Cardiac Rehab from 07/26/2016 in [facility] Cardiac and Pulmonary Rehab  Date  07/19/16  Educator  [facility]  Instruction Review Code  2- meets goals/outcomes      Stress Management: - Provides group verbal and written instruction about the health risks of elevated stress, cause of high stress, and healthy ways to reduce stress. Flowsheet Row Cardiac Rehab from 07/26/2016 in [facility] Cardiac and Pulmonary Rehab  Date  07/21/16  Educator  [facility]  Instruction Review Code  2- meets goals/outcomes      Depression: - Provides group verbal and written instruction on the correlation between heart/lung disease and depressed mood, treatment options, and the stigmas associated with seeking treatment. Flowsheet Row Cardiac Rehab from 07/26/2016 in [facility] Cardiac and Pulmonary  Rehab  Date  06/30/16  Educator  [facility]  Instruction Review Code  2- meets goals/outcomes      Anatomy & Physiology of the Heart: - Group verbal and written instruction and models provide basic cardiac anatomy and physiology, with the coronary  electrical and arterial systems. Review of: AMI, Angina, Valve disease, Heart Failure, Cardiac Arrhythmia, Pacemakers, and the ICD. Flowsheet Row Cardiac Rehab from 07/26/2016 in [facility] Cardiac and Pulmonary Rehab  Date  07/26/16  Educator  SB  Instruction Review Code  2- meets goals/outcomes      Cardiac Procedures: - Group verbal and written instruction and models to describe the testing methods done to diagnose heart disease. Reviews the outcomes of the test results. Describes the treatment choices: Medical Management, Angioplasty, or Coronary Bypass Surgery.   Cardiac Medications: - Group verbal and written instruction to review commonly prescribed medications for heart disease. Reviews the medication, class of the drug, and side effects. Includes the steps to properly store meds and maintain the prescription regimen. Flowsheet Row Cardiac Rehab from 07/26/2016 in [facility] Cardiac and Pulmonary Rehab  Date  06/21/16  Educator  SB  Instruction Review Code  2- meets goals/outcomes [cardiac meds lecture 2]      Go Sex-Intimacy & Heart Disease, Get SMART - Goal Setting: - Group verbal and written instruction through game format to discuss heart disease and the return to sexual intimacy. Provides group verbal and written material to discuss and apply goal setting through the application of the S.M.A.R.T. Method.   Other Matters of the Heart: - Provides group verbal, written materials and models to describe Heart Failure, Angina, Valve Disease, and Diabetes in the realm of heart disease. Includes description of the disease process and treatment options available to the cardiac patient. Flowsheet Row Cardiac Rehab from 07/26/2016 in [facility] Cardiac and Pulmonary Rehab  Date  07/26/16  Educator  SB  Instruction Review Code  2- meets goals/outcomes      Exercise & Equipment Safety: - Individual verbal instruction and demonstration of equipment use and safety with use of the equipment. Flowsheet  Row Cardiac Rehab from 07/26/2016 in [facility] Cardiac and Pulmonary Rehab  Date  04/28/16  Educator  D.W.  Instruction Review Code  1- partially meets, needs review/practice      Infection Prevention: - Provides verbal and written material to individual with discussion of infection control including proper hand washing and proper equipment cleaning during exercise session. Flowsheet Row Cardiac Rehab from 07/26/2016 in [facility] Cardiac and Pulmonary Rehab  Date  04/28/16  Educator  DW  Instruction Review Code  2- meets goals/outcomes      Falls Prevention: - Provides verbal and written material to individual with discussion of falls prevention and safety. Flowsheet Row Cardiac Rehab from 07/26/2016 in [facility] Cardiac and Pulmonary Rehab  Date  04/28/16  Educator  DW  Instruction Review Code  2- meets goals/outcomes      Diabetes: - Individual verbal and written instruction to review signs/symptoms of diabetes, desired ranges of glucose level fasting, after meals and with exercise. Advice that pre and post exercise glucose checks will be done for 3 sessions at entry of program. Flowsheet Row Cardiac Rehab from 07/26/2016 in [facility] Cardiac and Pulmonary Rehab  Date  04/28/16  Educator  DW  Instruction Review Code  2- meets goals/outcomes       Knowledge Questionnaire Score:     Knowledge Questionnaire Score - 07/30/16 [id number]      Knowledge Questionnaire Score  Pre Score 22/28   Post Score 24/28      Core Components/Risk Factors/Patient Goals at Admission:   Core Components/Risk Factors/Patient Goals Review:      Goals and Risk Factor Review    Row Name 06/21/16 1505 06/30/16 0843 07/07/16 1003 07/07/16 1014 07/28/16 1027     Core Components/Risk Factors/Patient Goals Review   Personal Goals Review Weight Management/Obesity;Sedentary;Increase Strength and Stamina;Hypertension;Lipids;Stress;Diabetes;Heart Failure;Other Heart Failure  - Weight Management/Obesity;Sedentary;Increase  Strength and Stamina;Diabetes;Heart Failure;Hypertension;Lipids;Stress Weight Management/Obesity;Sedentary;Increase Strength and Stamina;Improve shortness of breath with ADL's;Heart Failure;Diabetes;Lipids;Hypertension;Stress   Review [name] has returned from a week at the beach.  He is doing well with exercise.  He did do some walking at the beach.  His weight is up some today, but overall had been trending down.  His lipids were doing better at last check.  His blood pressures are good.  He is weighing daily and watching his salt intake.  Stress is good and his shortness of breath has been improving. [name] was able to state the rules of heart failure management today.  He weighs daily and watches his salt intake.  He knows when to call MD or 911. I explained and wrote out [name]'s blood pressures today 78/50 with dizziness after exercises I explained that sometimes with extra water fluid it will teter toter his heart/blood pressure if I give him too much water.  [name] is doing well.  His weight is back up today, but he is weighing daily.  His blood sugars have been in the 130s.  His blood pressure has been good here, but he is not taking it at home.  His stress level is up some as he just found out that his son had a stress test and needs to see a cardiologist.  He is doing some walking on his off days. [name] has been coming to classes more consisitently.  Overall, his weight is trending down; however, today is was up again.  He is walking at home some and working in the yard.  He continues to have some SOB, but it has improved since being in the program.  HIs biggest stressor now is that he cannot get out in the heat and humidiity for the length of time that he used to and is needing to rely on help.  He has come off of his Metformin and blood sugar are doing well.  His blood pressures have been good and no problems with his statins.  He continues to monitor his heart failure symptoms at home.   Expected Outcomes  We will give [name] a heart failure zone sheet for home use.  He will continue to come to exercise and education classes.  We will continue to monitor for progress. [name] is able to stay on top of his heart failure and prevent exarcerbations. No dizziness.  [name] will continue to come to exercise and education classes.  He will continue to monitor his weight daily. [name] will be finishing the program soon.  We will continue to monitor his weight and blood pressures as we work on building up his stamina.  He has found success using PLB and we will continue to encourage him to use this technique at home as well.      Core Components/Risk Factors/Patient Goals at Discharge (Final Review):      Goals and Risk Factor Review - 07/28/16 1027      Core Components/Risk Factors/Patient Goals Review   Personal Goals Review Weight Management/Obesity;Sedentary;Increase Strength and Stamina;Improve shortness  of breath with ADL's;Heart Failure;Diabetes;Lipids;Hypertension;Stress   Review [name] has been coming to classes more consisitently.  Overall, his weight is trending down; however, today is was up again.  He is walking at home some and working in the yard.  He continues to have some SOB, but it has improved since being in the program.  HIs biggest stressor now is that he cannot get out in the heat and humidiity for the length of time that he used to and is needing to rely on help.  He has come off of his Metformin and blood sugar are doing well.  His blood pressures have been good and no problems with his statins.  He continues to monitor his heart failure symptoms at home.   Expected Outcomes [name] will be finishing the program soon.  We will continue to monitor his weight and blood pressures as we work on building up his stamina.  He has found success using PLB and we will continue to encourage him to use this technique at home as well.      ITP Comments:     ITP Comments    Row Name 06/23/16 0735 07/07/16  0958 07/07/16 1005 07/21/16 0757 08/06/16 1027   ITP Comments 30 day review. Continue with ITP I called and spoke to Dr. Laverna Peace nurse about "[name]" [name]. re; after exercise and relaxaton his blood pressure as 78/50. Given 240cc of water times 2 and his blood pressure came up to 90/70 but he was still dizzy. I asked Loden Alber" if he was on a fluid restriction and he said he didn't know of any. I gave him approx 200cc more of water and his blood pressure dropped to 80/50. We had him sit here for 10 minutes and dhis blood pressure came up to 104/58 without dizziness. Later I spoke to his wife and she said this week [name] has been c/o being dizzy when he stands up. His wife will call back the Nurse and will ask if he is suppose to be on any type of fluid restriction and about his current medicines which he is taking.  [name] also said that "this thing is not working and I won't go back to the doctor till Sept. I spoke to the [facility] RN and she said the notes say that the MD said his pacemaker is not needed 100% of the time since his body's own pacemaker is working but sometimes his pacemaker is kicking it and he is being paced per the rhythm strips today.  30 day review. Continue with ITP unless changes noted by Medical Director at signature of review. Called to check on patient.  He had been admitted to [facility] for acute heart failure.  He was planning to graduate today due to vacation plans.  After talking with him, he request to go ahead and discharge from the program today.  He had already completed his post test.  He plans to continue to exercise by going to the mall to walk.   [place] Name 08/18/16 0953           ITP Comments Discharged          Comments:

## 2016-08-30 DIAGNOSIS — I5022 Chronic systolic (congestive) heart failure: Secondary | ICD-10-CM | POA: Diagnosis not present

## 2016-08-30 DIAGNOSIS — Z006 Encounter for examination for normal comparison and control in clinical research program: Secondary | ICD-10-CM | POA: Diagnosis not present

## 2016-08-31 DIAGNOSIS — Z4502 Encounter for adjustment and management of automatic implantable cardiac defibrillator: Secondary | ICD-10-CM | POA: Diagnosis not present

## 2016-08-31 DIAGNOSIS — I472 Ventricular tachycardia: Secondary | ICD-10-CM | POA: Diagnosis not present

## 2016-08-31 DIAGNOSIS — Z95810 Presence of automatic (implantable) cardiac defibrillator: Secondary | ICD-10-CM | POA: Diagnosis not present

## 2016-08-31 DIAGNOSIS — I4892 Unspecified atrial flutter: Secondary | ICD-10-CM | POA: Diagnosis not present

## 2016-08-31 DIAGNOSIS — Z951 Presence of aortocoronary bypass graft: Secondary | ICD-10-CM | POA: Diagnosis not present

## 2016-08-31 DIAGNOSIS — Z7982 Long term (current) use of aspirin: Secondary | ICD-10-CM | POA: Diagnosis not present

## 2016-08-31 DIAGNOSIS — I5023 Acute on chronic systolic (congestive) heart failure: Secondary | ICD-10-CM | POA: Diagnosis not present

## 2016-08-31 DIAGNOSIS — R5383 Other fatigue: Secondary | ICD-10-CM | POA: Diagnosis not present

## 2016-08-31 DIAGNOSIS — Z79899 Other long term (current) drug therapy: Secondary | ICD-10-CM | POA: Diagnosis not present

## 2016-08-31 DIAGNOSIS — I5022 Chronic systolic (congestive) heart failure: Secondary | ICD-10-CM | POA: Diagnosis not present

## 2016-09-09 ENCOUNTER — Other Ambulatory Visit: Payer: Self-pay

## 2016-09-09 NOTE — Patient Outreach (Addendum)
[facility]) Care Management  09/09/2016  MARYAN MCFEATERS [birth year] [id number]     Telephone Screen  Referral Date: 09/08/16 Referral Source: [facility] Referral Reason:  "health coach for CHF, sodium and fluid restriction diet education. H/o Cancer, COPD,DM,HTN, HLD, depression, completed TOC program with [facility] on 09/07/16"    Outreach attempt # 1 to patient. No answer at present. RN CM left HIPAA compliant voicemail message along with contact info.      Plan: RN CM will make outreach attempt to patient within one week if no return call from patient.  Enzo Montgomery, RN,BSN,CCM [place] Management Telephonic Care Management Coordinator Direct Phone: [phone] Toll Free: [phone] Fax: [phone]

## 2016-09-09 NOTE — Patient Outreach (Addendum)
[facility]) Care Management  09/09/2016  [name] 06/23/1946 [id number]   Telephone Screen  Referral Date: 09/08/16 Referral Source: [facility] Referral Reason:  "health coach for CHF, sodium and fluid restriction diet education. H/o Cancer, COPD,DM,HTN, HLD, depression, completed TOC program with [facility] on 09/07/16"   Voicemail message received from spouse. Return call placed to patient/spouse. Verbal consent received from patient to complete screening with him and spouse via speaker phone.   Social: Patient resides in hie home along with spouse. He is independent with all ADLs/IADLs. He drives himself to medical appts. Denies any recent falls. DME in the home include scales, scooter chair and CPAP machine.  Conditions: patient has PMH of CHF, EF 20%, anemia, prostate CA, COPD, cardiomyopathy, DM, HLD, GERD, HTN, OSA, cardiomyopathy, pulmonary edema,MI(1990,1994,1997). He was in the hospital in Aug for CHF exacerbation. Patient monitoring cbg in the home daily. He reports cbgs normally range in the lower 100s. HE has a scale in the home and is weighing daily. No issues with edema at present. He does report some SOB especially with exertion at times.He reports that he does also suffers from vertigo episodes at times.  Patient voices being adherent to using CPAP nightly.   Medications: He reports that he is currently taking 20 meds. He denies any issues with being able to afford meds. Patient reports he gets most of his meds via [employer] benefits. Spouse assists with management of meds.  Appointments: Patient saw PCP earlier this week. He is also followed by cardiologist at [facility]-Dr. Kyla Balzarine which he saw last week and sees Dr. Nehemiah Massed.  Advance Directives: Unable to address during this call.  Consent: [facility] services discussed and reviewed. Patient has no care coordination needs at this time. He is agreeable to RN health coach for further education and support on  management of chronic conditions.   Plan: RN CM will notify [facility] administrative assistant of case status. RN CM will send [facility] RN health coach referral for further disease management and support. RN CM will send [facility] pharmacy referral for polypharmacy med review.   Enzo Montgomery, RN,BSN,CCM [place] Management Telephonic Care Management Coordinator Direct Phone: [phone] Toll Free: [phone] Fax: [phone]

## 2016-09-14 ENCOUNTER — Ambulatory Visit: Payer: Self-pay

## 2016-09-15 DIAGNOSIS — E1142 Type 2 diabetes mellitus with diabetic polyneuropathy: Secondary | ICD-10-CM | POA: Diagnosis not present

## 2016-09-20 DIAGNOSIS — I5023 Acute on chronic systolic (congestive) heart failure: Secondary | ICD-10-CM | POA: Diagnosis not present

## 2016-09-20 DIAGNOSIS — R42 Dizziness and giddiness: Secondary | ICD-10-CM | POA: Diagnosis not present

## 2016-09-20 DIAGNOSIS — I10 Essential (primary) hypertension: Secondary | ICD-10-CM | POA: Diagnosis not present

## 2016-09-20 DIAGNOSIS — Z951 Presence of aortocoronary bypass graft: Secondary | ICD-10-CM | POA: Diagnosis not present

## 2016-09-20 DIAGNOSIS — I25810 Atherosclerosis of coronary artery bypass graft(s) without angina pectoris: Secondary | ICD-10-CM | POA: Diagnosis not present

## 2016-09-20 DIAGNOSIS — I255 Ischemic cardiomyopathy: Secondary | ICD-10-CM | POA: Diagnosis not present

## 2016-09-20 DIAGNOSIS — R0609 Other forms of dyspnea: Secondary | ICD-10-CM | POA: Diagnosis not present

## 2016-09-20 DIAGNOSIS — I5022 Chronic systolic (congestive) heart failure: Secondary | ICD-10-CM | POA: Diagnosis not present

## 2016-09-20 DIAGNOSIS — G4733 Obstructive sleep apnea (adult) (pediatric): Secondary | ICD-10-CM | POA: Diagnosis not present

## 2016-09-20 DIAGNOSIS — E1140 Type 2 diabetes mellitus with diabetic neuropathy, unspecified: Secondary | ICD-10-CM | POA: Diagnosis not present

## 2016-09-20 DIAGNOSIS — E7800 Pure hypercholesterolemia, unspecified: Secondary | ICD-10-CM | POA: Diagnosis not present

## 2016-09-21 ENCOUNTER — Ambulatory Visit: Payer: Self-pay | Admitting: *Deleted

## 2016-09-22 DIAGNOSIS — I509 Heart failure, unspecified: Secondary | ICD-10-CM | POA: Diagnosis not present

## 2016-09-22 DIAGNOSIS — Z794 Long term (current) use of insulin: Secondary | ICD-10-CM | POA: Diagnosis not present

## 2016-09-22 DIAGNOSIS — I429 Cardiomyopathy, unspecified: Secondary | ICD-10-CM | POA: Diagnosis not present

## 2016-09-22 DIAGNOSIS — E1142 Type 2 diabetes mellitus with diabetic polyneuropathy: Secondary | ICD-10-CM | POA: Diagnosis not present

## 2016-10-04 ENCOUNTER — Other Ambulatory Visit: Payer: Self-pay | Admitting: *Deleted

## 2016-10-04 NOTE — Patient Outreach (Signed)
[place] [facility]) Care Management  10/04/2016  [name] Apr 07, 1946 [id number]    RN Health Coach  Attempted #1  Follow up outreach call to patient.  Patient was unavailable. HIPPA compliance voicemail message was left with return callback number.   Plan: RN will call patient again within 14 days.    [place] Care Management [phone]

## 2016-10-08 ENCOUNTER — Other Ambulatory Visit: Payer: Self-pay | Admitting: *Deleted

## 2016-10-08 NOTE — Patient Outreach (Signed)
[place] [facility]) Care Management  10/08/2016  [name] 1946/03/08 [id number]   RN Health Coach  attempted #2 Follow up outreach call to patient.  Patient was unavailable. HIPPA compliance voicemail message was left with return callback number.   Plan: RN will call patient again within 14 days.    [place] Care Management [phone]

## 2016-10-18 DIAGNOSIS — I10 Essential (primary) hypertension: Secondary | ICD-10-CM | POA: Diagnosis not present

## 2016-10-18 DIAGNOSIS — I255 Ischemic cardiomyopathy: Secondary | ICD-10-CM | POA: Diagnosis not present

## 2016-10-18 DIAGNOSIS — I25810 Atherosclerosis of coronary artery bypass graft(s) without angina pectoris: Secondary | ICD-10-CM | POA: Diagnosis not present

## 2016-10-19 ENCOUNTER — Other Ambulatory Visit: Payer: Self-pay | Admitting: *Deleted

## 2016-10-19 ENCOUNTER — Encounter: Payer: Self-pay | Admitting: *Deleted

## 2016-10-19 DIAGNOSIS — I5022 Chronic systolic (congestive) heart failure: Secondary | ICD-10-CM

## 2016-10-19 NOTE — Patient Outreach (Addendum)
[facility]) Care Management  10/19/2016   CLOYD VOLCY 10/17/1946 [id number]  [place] received  telephone call from patient and wife.  Hipaa compliance verified. Husband approved on phone for [place] to talk with wife.  Per patient he has been diagnosed with Congestive Heart Failure about 5 yrs ago. Per patient his wife and son who is an EMT that lives with  him. Per wife and patient he exercises by walking about 30 minutes a day. Patient uses a scooter when he goes to [employer] and shopping. Patient uses a CPAP and is on 5 liters of O2 at night. Patient weight today was 240.2. Per wife patient has shortness of breath with exertion. Patient has been for eye exam and has gotten new eye glasses. Patient is on more thatn 15 medications. All medicines are not on medication review.  Patient and wife agreed to follow up outreach calls.    Objective:   Current Medications:  Current Outpatient Prescriptions  Medication Sig Dispense Refill  . acetaminophen (TYLENOL) 325 MG tablet Take 2 tablets (650 mg total) by mouth every 6 (six) hours as needed for mild pain (or Fever >/= 101).    Marland Kitchen albuterol (PROVENTIL HFA;VENTOLIN HFA) 108 (90 Base) MCG/ACT inhaler Inhale 2 puffs into the lungs 3 (three) times daily as needed for wheezing or shortness of breath.    Marland Kitchen albuterol-ipratropium (COMBIVENT) 18-103 MCG/ACT inhaler Inhale 1 puff into the lungs 2 times daily at 12 noon and 4 pm.    . aspirin 81 MG tablet Take 81 mg by mouth every morning.    Marland Kitchen atorvastatin (LIPITOR) 40 MG tablet Take 80 mg by mouth every evening.     . budesonide-formoterol (SYMBICORT) 80-4.5 MCG/ACT inhaler Inhale 2 puffs into the lungs 2 (two) times daily.    . clopidogrel (PLAVIX) 75 MG tablet Take 75 mg by mouth every morning.    Marland Kitchen dexlansoprazole (DEXILANT) 60 MG capsule Take 60 mg by mouth daily. Reported on 04/28/2016    . ferrous sulfate 325 (65 FE) MG tablet Take 325 mg by mouth 2  (two) times daily with a meal.    . finasteride (PROSCAR) 5 MG tablet Take 5 mg by mouth at bedtime.    Marland Kitchen FLUoxetine (PROZAC) 20 MG tablet Take 20 mg by mouth every morning.    . furosemide (LASIX) 40 MG tablet Take 1 tablet (40 mg total) by mouth 2 (two) times daily. 60 tablet 2  . gabapentin (NEURONTIN) 300 MG capsule Take 600 mg by mouth 3 (three) times daily.    . insulin glargine (LANTUS) 100 UNIT/ML injection Inject 0.75 mLs (75 Units total) into the skin at bedtime. (Patient taking differently: Inject 60 Units into the skin at bedtime. ) 10 mL 11  . lisinopril (PRINIVIL,ZESTRIL) 20 MG tablet Take 20 mg by mouth daily.    Marland Kitchen loratadine (CLARITIN) 10 MG tablet Take 10 mg by mouth daily.    . metFORMIN (GLUCOPHAGE) 1000 MG tablet Take 1,000 mg by mouth 2 (two) times daily with a meal.     . metoprolol (LOPRESSOR) 50 MG tablet Take 50 mg by mouth 2 (two) times daily.    . Multiple Vitamins-Minerals (CENTRUM SILVER PO) Take 1 tablet by mouth every morning.    . nitroGLYCERIN (NITROSTAT) 0.4 MG SL tablet Place 0.4 mg under the tongue every 5 (five) minutes as needed for chest pain.    . ranolazine (RANEXA) 500 MG 12 hr tablet Take 500  mg by mouth 2 (two) times daily.    . tamsulosin (FLOMAX) 0.4 MG CAPS capsule Take 0.4 mg by mouth daily after supper.    . potassium chloride SA (KLOR-CON M20) 20 MEQ tablet Take 1 tablet (20 mEq total) by mouth daily. (Patient not taking: Reported on 10/19/2016) 30 tablet 0   No current facility-administered medications for this visit.     Functional Status:  In your present state of health, do you have any difficulty performing the following activities: 10/19/2016 04/05/2016  Hearing? N Y  Vision? N N  Difficulty concentrating or making decisions? N N  Walking or climbing stairs? - Y  Dressing or bathing? N N  Doing errands, shopping? Y N  Preparing Food and eating ? Y -  Using the Toilet? N -  In the past six months, have you accidently leaked urine? N -   Do you have problems with loss of bowel control? N -  Managing your Medications? N -  Managing your Finances? N -  Housekeeping or managing your Housekeeping? Y -  Some recent data might be hidden    Fall/Depression Screening: PHQ 2/9 Scores 10/19/2016 09/09/2016 07/30/2016 04/28/2016 04/05/2016 02/13/2016  PHQ - 2 Score 0 0 5 6 0 0  PHQ- 9 Score - - 15 14 - -   THN CM Care Plan Problem One   Flowsheet Row Most Recent Value  Care Plan Problem One  Knowledge deficit in self management of CHF  Role Documenting the Problem One  [place] for Problem One  Active  THN Long Term Goal (31-90 days)  Patient will not have any readmissions for CHF within the next 90 days  THN Long Term Goal Start Date  10/19/16  Interventions for Problem One [place] reminded patient to take medications as per ordered. RN Health Coach reminded patient to keep appointments with PCP and cardiologist as per ordered. RN Health Coach will follow up with patient for discussion and teachback  THN CM Short Term Goal #1 (0-30 days)  Patient will be able to understand what zones are within the next 30 days   Interventions for Short Term Goal #1  RN discussed zones and action plans with patient. RN sent patient educational material on Living better with CHF. RN will follow up tor discussion and teach back  THN CM Short Term Goal #2 (0-30 days)  Patient will report weighing daily and recording within the next 30 days  THN CM Short Term Goal #2 Start Date  10/19/16  Interventions for Short Term Goal #2  RN discussed the importance of daily weighing and recording. RN discussed any weight gain of 3 pounds or morein a day  or 5 pounds in one week. Patient is to nitify Dr. Shelly Bombard willfollow up further discussion and teachback.      Assessment: Patient is on more than 15 medications Patient and wife does not know what CHF  Zones or action plan is Patient will benefit from Health Coach telephonic  outreach for education and support for CHF self management. Plan:  RN referred patient to pharmacy for medication reconciliation RN sent educational material on Living with CHF RN sent EMMI  educational material on How to be salt smart RN sent EMMI  educational material on Keeping track of your [employer] RN sent EMMI  educational material on Understanding water pills and thirst RN will send an assessment and barriers letter to physician RN will follow up  within the month of December with Discussion and teach back  [place] Management [phone]

## 2016-10-22 ENCOUNTER — Ambulatory Visit: Payer: Self-pay | Admitting: *Deleted

## 2016-11-17 ENCOUNTER — Ambulatory Visit: Payer: Self-pay | Admitting: *Deleted

## 2016-12-07 ENCOUNTER — Other Ambulatory Visit: Payer: Self-pay | Admitting: *Deleted

## 2016-12-07 NOTE — Patient Outreach (Signed)
[facility]) Care Management  12/07/2016  [name] November 19, 1946 [id number]   RN Health Coach attempted #1 follow up outreach telephone call to patient.  Per voicemail message the mailbox is full. Plan: RN will call patient again within 14 days.

## 2016-12-10 DIAGNOSIS — I472 Ventricular tachycardia: Secondary | ICD-10-CM | POA: Diagnosis not present

## 2016-12-10 DIAGNOSIS — Z923 Personal history of irradiation: Secondary | ICD-10-CM | POA: Diagnosis not present

## 2016-12-10 DIAGNOSIS — I25810 Atherosclerosis of coronary artery bypass graft(s) without angina pectoris: Secondary | ICD-10-CM | POA: Diagnosis not present

## 2016-12-10 DIAGNOSIS — D638 Anemia in other chronic diseases classified elsewhere: Secondary | ICD-10-CM | POA: Diagnosis not present

## 2016-12-10 DIAGNOSIS — I5023 Acute on chronic systolic (congestive) heart failure: Secondary | ICD-10-CM | POA: Diagnosis not present

## 2016-12-10 DIAGNOSIS — N189 Chronic kidney disease, unspecified: Secondary | ICD-10-CM | POA: Diagnosis not present

## 2016-12-10 DIAGNOSIS — F419 Anxiety disorder, unspecified: Secondary | ICD-10-CM | POA: Diagnosis not present

## 2016-12-10 DIAGNOSIS — R05 Cough: Secondary | ICD-10-CM | POA: Diagnosis not present

## 2016-12-10 DIAGNOSIS — I130 Hypertensive heart and chronic kidney disease with heart failure and stage 1 through stage 4 chronic kidney disease, or unspecified chronic kidney disease: Secondary | ICD-10-CM | POA: Diagnosis not present

## 2016-12-10 DIAGNOSIS — R0602 Shortness of breath: Secondary | ICD-10-CM | POA: Diagnosis not present

## 2016-12-10 DIAGNOSIS — I10 Essential (primary) hypertension: Secondary | ICD-10-CM | POA: Diagnosis not present

## 2016-12-10 DIAGNOSIS — Z8546 Personal history of malignant neoplasm of prostate: Secondary | ICD-10-CM | POA: Diagnosis not present

## 2016-12-10 DIAGNOSIS — G4733 Obstructive sleep apnea (adult) (pediatric): Secondary | ICD-10-CM | POA: Diagnosis not present

## 2016-12-10 DIAGNOSIS — Z95810 Presence of automatic (implantable) cardiac defibrillator: Secondary | ICD-10-CM | POA: Diagnosis not present

## 2016-12-10 DIAGNOSIS — E1121 Type 2 diabetes mellitus with diabetic nephropathy: Secondary | ICD-10-CM | POA: Diagnosis not present

## 2016-12-10 DIAGNOSIS — F1021 Alcohol dependence, in remission: Secondary | ICD-10-CM | POA: Diagnosis not present

## 2016-12-10 DIAGNOSIS — E785 Hyperlipidemia, unspecified: Secondary | ICD-10-CM | POA: Diagnosis not present

## 2016-12-10 DIAGNOSIS — N400 Enlarged prostate without lower urinary tract symptoms: Secondary | ICD-10-CM | POA: Diagnosis not present

## 2016-12-10 DIAGNOSIS — Z794 Long term (current) use of insulin: Secondary | ICD-10-CM | POA: Diagnosis not present

## 2016-12-10 DIAGNOSIS — I5043 Acute on chronic combined systolic (congestive) and diastolic (congestive) heart failure: Secondary | ICD-10-CM | POA: Diagnosis not present

## 2016-12-10 DIAGNOSIS — D631 Anemia in chronic kidney disease: Secondary | ICD-10-CM | POA: Diagnosis not present

## 2016-12-10 DIAGNOSIS — E6609 Other obesity due to excess calories: Secondary | ICD-10-CM | POA: Diagnosis not present

## 2016-12-10 DIAGNOSIS — I255 Ischemic cardiomyopathy: Secondary | ICD-10-CM | POA: Diagnosis not present

## 2016-12-10 DIAGNOSIS — E1142 Type 2 diabetes mellitus with diabetic polyneuropathy: Secondary | ICD-10-CM | POA: Diagnosis not present

## 2016-12-10 DIAGNOSIS — Z87891 Personal history of nicotine dependence: Secondary | ICD-10-CM | POA: Diagnosis not present

## 2016-12-10 DIAGNOSIS — I517 Cardiomegaly: Secondary | ICD-10-CM | POA: Diagnosis not present

## 2016-12-10 DIAGNOSIS — R972 Elevated prostate specific antigen [PSA]: Secondary | ICD-10-CM | POA: Diagnosis not present

## 2016-12-10 DIAGNOSIS — F329 Major depressive disorder, single episode, unspecified: Secondary | ICD-10-CM | POA: Diagnosis not present

## 2016-12-10 DIAGNOSIS — J449 Chronic obstructive pulmonary disease, unspecified: Secondary | ICD-10-CM | POA: Diagnosis not present

## 2016-12-10 DIAGNOSIS — E1122 Type 2 diabetes mellitus with diabetic chronic kidney disease: Secondary | ICD-10-CM | POA: Diagnosis not present

## 2016-12-10 DIAGNOSIS — Z6836 Body mass index (BMI) 36.0-36.9, adult: Secondary | ICD-10-CM | POA: Diagnosis not present

## 2016-12-13 ENCOUNTER — Other Ambulatory Visit: Payer: Self-pay | Admitting: Pharmacist

## 2016-12-13 NOTE — Patient Outreach (Signed)
[facility]) Care Management  12/13/2016  [name] 28-Jan-1946 [id number]  71 year old male referred to [place] for medication management/adherence.  Per referral patient has past medical history of heart failure and diabetes and currently on >20 medications.  When previous medication reconciliation were completed patient had many duplicate medications on his medication list.  Called patient but was unable to reach him via telephone today and left a HIPAA compliant voicemail asking him to return my call (unsuccessful outreach attempt #1)  Plan: Will followup via telephone in 1-2 days  Bennye Alm, PharmD, [place] PGY2 Pharmacy Resident [phone]

## 2016-12-14 ENCOUNTER — Other Ambulatory Visit: Payer: Self-pay | Admitting: Pharmacist

## 2016-12-14 ENCOUNTER — Ambulatory Visit: Payer: Self-pay | Admitting: Pharmacist

## 2016-12-14 NOTE — Patient Outreach (Signed)
[facility]) Care Management  12/14/2016  [name] 11-17-1946 [id number]   71 year old male referred to [place] for medication management/adherence.  Per referral patient has past medical history of heart failure and diabetes and currently on >20 medications.  When previous medication reconciliation were completed patient had many duplicate medications on his medication list. Called patient today and talked with patients wife who stated patient was sleeping but had just been discharged from [facility].  Provided patients wife with my contact information and instructed her to have patient call me back.   Plan: Will followup via telephone within 1 day and likely schedule home visit due to complexity of patients medications   Bennye Alm, PharmD, [place] PGY2 Pharmacy Resident [phone]  Addendum: Spoke with patient and he reports adherence to his medications and denies confusion with his regimen. Will schedule home visit for tomorrow to assess need for further [place] services and perform 30 day post discharge medication reconciliation. Wife did request pill boxes as patients current pill boxes were not large enough to hold all of his medications

## 2016-12-15 ENCOUNTER — Emergency Department
Admission: EM | Admit: 2016-12-15 | Discharge: 2016-12-15 | Disposition: A | Discharge status: Home / Self Care | Payer: PPO | Attending: Emergency Medicine | Admitting: Emergency Medicine

## 2016-12-15 ENCOUNTER — Other Ambulatory Visit: Payer: Self-pay | Admitting: Pharmacist

## 2016-12-15 ENCOUNTER — Ambulatory Visit: Payer: Self-pay | Admitting: Pharmacist

## 2016-12-15 ENCOUNTER — Emergency Department: Payer: PPO

## 2016-12-15 DIAGNOSIS — I110 Hypertensive heart disease with heart failure: Secondary | ICD-10-CM | POA: Diagnosis not present

## 2016-12-15 DIAGNOSIS — Z951 Presence of aortocoronary bypass graft: Secondary | ICD-10-CM | POA: Diagnosis not present

## 2016-12-15 DIAGNOSIS — Z95810 Presence of automatic (implantable) cardiac defibrillator: Secondary | ICD-10-CM | POA: Diagnosis not present

## 2016-12-15 DIAGNOSIS — Z794 Long term (current) use of insulin: Secondary | ICD-10-CM | POA: Insufficient documentation

## 2016-12-15 DIAGNOSIS — R5383 Other fatigue: Secondary | ICD-10-CM | POA: Insufficient documentation

## 2016-12-15 DIAGNOSIS — E119 Type 2 diabetes mellitus without complications: Secondary | ICD-10-CM | POA: Insufficient documentation

## 2016-12-15 DIAGNOSIS — I509 Heart failure, unspecified: Secondary | ICD-10-CM | POA: Insufficient documentation

## 2016-12-15 DIAGNOSIS — Z87891 Personal history of nicotine dependence: Secondary | ICD-10-CM | POA: Diagnosis not present

## 2016-12-15 DIAGNOSIS — Z7902 Long term (current) use of antithrombotics/antiplatelets: Secondary | ICD-10-CM | POA: Diagnosis not present

## 2016-12-15 DIAGNOSIS — Z5181 Encounter for therapeutic drug level monitoring: Secondary | ICD-10-CM | POA: Diagnosis not present

## 2016-12-15 DIAGNOSIS — J811 Chronic pulmonary edema: Secondary | ICD-10-CM | POA: Diagnosis not present

## 2016-12-15 DIAGNOSIS — Z79899 Other long term (current) drug therapy: Secondary | ICD-10-CM | POA: Diagnosis not present

## 2016-12-15 DIAGNOSIS — R9431 Abnormal electrocardiogram [ECG] [EKG]: Secondary | ICD-10-CM | POA: Diagnosis not present

## 2016-12-15 DIAGNOSIS — Z5321 Procedure and treatment not carried out due to patient leaving prior to being seen by health care provider: Secondary | ICD-10-CM | POA: Insufficient documentation

## 2016-12-15 DIAGNOSIS — R0602 Shortness of breath: Secondary | ICD-10-CM | POA: Diagnosis not present

## 2016-12-15 DIAGNOSIS — R600 Localized edema: Secondary | ICD-10-CM | POA: Diagnosis not present

## 2016-12-15 DIAGNOSIS — I517 Cardiomegaly: Secondary | ICD-10-CM | POA: Diagnosis not present

## 2016-12-15 DIAGNOSIS — J449 Chronic obstructive pulmonary disease, unspecified: Secondary | ICD-10-CM | POA: Insufficient documentation

## 2016-12-15 DIAGNOSIS — J90 Pleural effusion, not elsewhere classified: Secondary | ICD-10-CM | POA: Diagnosis not present

## 2016-12-15 LAB — BASIC METABOLIC PANEL
ANION GAP: 10 (ref 5–15)
BUN: 32 mg/dL — ABNORMAL HIGH (ref 6–20)
CHLORIDE: 105 mmol/L (ref 101–111)
CO2: 22 mmol/L (ref 22–32)
Calcium: 8.4 mg/dL — ABNORMAL LOW (ref 8.9–10.3)
Creatinine, Ser: 1.75 mg/dL — ABNORMAL HIGH (ref 0.61–1.24)
GFR, EST AFRICAN AMERICAN: 43 mL/min — AB (ref >60)
GFR, EST NON AFRICAN AMERICAN: 37 mL/min — AB (ref >60)
Glucose, Bld: 112 mg/dL — ABNORMAL HIGH (ref 65–99)
Potassium: 4.8 mmol/L (ref 3.5–5.1)
SODIUM: 137 mmol/L (ref 135–145)

## 2016-12-15 LAB — CBC
HEMATOCRIT: 28.6 % — AB (ref 40.0–52.0)
HEMOGLOBIN: 9.6 g/dL — AB (ref 13.0–18.0)
MCH: 30.3 pg (ref 26.0–34.0)
MCHC: 33.7 g/dL (ref 32.0–36.0)
MCV: 90.1 fL (ref 80.0–100.0)
Platelets: 183 10*3/uL (ref 150–440)
RBC: 3.18 MIL/uL — AB (ref 4.40–5.90)
RDW: 14.7 % — ABNORMAL HIGH (ref 11.5–14.5)
WBC: 8.2 10*3/uL (ref 3.8–10.6)

## 2016-12-15 NOTE — Patient Outreach (Signed)
[facility] [facility]) Care Management  12/15/2016  YONATHAN DEMKE Aug 26, 1946 [id number]   71 year old male referred to [facility] for medication management/adherence. Per referral patient has past medical history of heart failure and diabetes and currently on >20 medications.  Patient recently discharged from [facility] for heart failure exacerbation.  Home visit scheduled for today but noted patient currently in emergency department at [facility].  Will cancel home visit and plan followup phone call to reschedule in 1-2 days.  Bennye Alm, PharmD, [place] PGY2 Pharmacy Resident [phone]

## 2016-12-15 NOTE — ED Triage Notes (Signed)
Pt reports, admitted to [facility] on Friday, discharged Monday and now with some SOB, shaking and weakness. Pt was admitted to fluid on the lungs. Pt reports symptoms started this am.

## 2016-12-16 ENCOUNTER — Telehealth: Payer: Self-pay | Admitting: Emergency Medicine

## 2016-12-16 DIAGNOSIS — Z87891 Personal history of nicotine dependence: Secondary | ICD-10-CM | POA: Diagnosis not present

## 2016-12-16 DIAGNOSIS — J811 Chronic pulmonary edema: Secondary | ICD-10-CM | POA: Diagnosis not present

## 2016-12-16 DIAGNOSIS — J90 Pleural effusion, not elsewhere classified: Secondary | ICD-10-CM | POA: Diagnosis not present

## 2016-12-17 ENCOUNTER — Other Ambulatory Visit: Payer: Self-pay | Admitting: Pharmacist

## 2016-12-17 NOTE — Patient Outreach (Signed)
[facility]) Care Management  12/17/2016  [name] 17-Dec-1945 [id number]  71 year old male referred to [place] for medication management/adherence. Per referral patient has past medical history of heart failure and diabetes and currently on >20 medications.  Patient recently discharged from [facility] for heart failure exacerbation from 12/10/16 to 12/13/16.  Had home visit scheduled on 12/15/16 but patient presented to [facility] emergency department then [facility] emergency department for shortness of breath but was not admitted.  Today I have called patient to reschedule home visit.  Patient denies shortness of breath or lower extremity edema.  He does report some residual fluid in his stomach.  Patient reports adherence to his medications but states he does need to review what he is taking but he is not currently home now.  Plan: Home visit next week to assess medication management/medication adherence Instructed patient to continue monitoring for signs/symptoms of fluid overload and to report >3 lb/day or 5 lb/week changes to his provider.  Bennye Alm, PharmD, [place] PGY2 Pharmacy Resident [phone]

## 2016-12-20 ENCOUNTER — Ambulatory Visit: Payer: Self-pay | Admitting: *Deleted

## 2016-12-21 DIAGNOSIS — N289 Disorder of kidney and ureter, unspecified: Secondary | ICD-10-CM | POA: Diagnosis not present

## 2016-12-21 DIAGNOSIS — I5023 Acute on chronic systolic (congestive) heart failure: Secondary | ICD-10-CM | POA: Diagnosis not present

## 2016-12-21 DIAGNOSIS — I10 Essential (primary) hypertension: Secondary | ICD-10-CM | POA: Diagnosis not present

## 2016-12-21 DIAGNOSIS — D638 Anemia in other chronic diseases classified elsewhere: Secondary | ICD-10-CM | POA: Diagnosis not present

## 2016-12-22 ENCOUNTER — Ambulatory Visit: Payer: Self-pay | Admitting: Pharmacist

## 2016-12-22 ENCOUNTER — Other Ambulatory Visit: Payer: Self-pay | Admitting: Pharmacist

## 2016-12-22 NOTE — Patient Outreach (Signed)
[facility] [facility]) Care Management  12/22/2016  [name] Oct 26, 1946 [id number]   71 year old male referred to [place] for medication management/adherence. Per referral patient has past medical history of heart failure and diabetes and currently on >20 medications. Patient recently discharged from [facility] for heart failure exacerbation from 12/10/16 to 12/13/16.  Had home visit scheduled on 12/15/16 but patient presented to [facility] emergency department then [facility] emergency department for shortness of breath but was not admitted. Called patients wife today to cancel home visit due to adverse weather.  Patients wife states they are not home currently but we could review medications at around 2-230 pm today. Will plan to followup today for medication review via telephone and reschedule home visit for next week.  Bennye Alm, PharmD, [place] PGY2 Pharmacy Resident [phone]   Addendum: Called patient back to discuss medications.  Patient states they had a family emergency come up and requests that I call her back in 1-2 days.   Will plan to followup in 1-2 days for medication review via telephone and reschedule home visit for next week.

## 2016-12-23 ENCOUNTER — Other Ambulatory Visit: Payer: Self-pay | Admitting: *Deleted

## 2016-12-23 ENCOUNTER — Encounter: Payer: Self-pay | Admitting: *Deleted

## 2016-12-23 ENCOUNTER — Other Ambulatory Visit: Payer: Self-pay | Admitting: Pharmacist

## 2016-12-23 DIAGNOSIS — I5022 Chronic systolic (congestive) heart failure: Secondary | ICD-10-CM

## 2016-12-23 NOTE — Patient Outreach (Signed)
[facility]) Care Management  12/23/2016  [name] 21-Feb-1946 [id number]   RN Health coach will do discipline closure. Patient was admitted to [facility] on 01/05- 0108. Transition of care to [facility] Case Management.  [place] Care Management [phone]

## 2016-12-23 NOTE — Patient Outreach (Signed)
[place] [facility]) Care Management  12/23/2016  [name] [birth year] [id number]  71 year old male referred to [place] for medication management/adherence. Per referral patient has past medical history of heart failure and diabetes and currently on >20 medications. Patient recently discharged from [facility] for heart failure exacerbation from 12/10/16 to 12/13/16. Patient was then seen at [facility] department on 12/15/2016 for fluid overload then discharged. Called patient today via telephone to discuss his medications .  He states he does not currently use a pill box and has them arranged in a drawer/on the counter separated based on morning and evening. He reports only using a pill box when going on a trip.  Patient reports adherence to his medications.  He gets most of his medications from the [place] and denies trouble affording his medications.    Reports chest pain once per week.  His chest pain occurs when he overexerts himself but his pain is relieved by one nitroglycerin.    Patient reports shortness of breath only when he walks fast.   Denies edema currently.  States he usually had edema in his stomach.   Denies lower extremity edema.    Reports weighing himself 6 out of 7 days a week.    Normally weighs in the morning in just underwear.   1/18: 242 lb (with clothes on at 430 pm which is not his normal time to weigh) 1/15: 233  1/14: 235 1/13: 233 1/12: 235  Patient was recently discharged from hospital and all medications have been reviewed via telephone.    Plan:  Discussed calling physician if weight gain > 3 lbs/day or >5 lbs/week.  Discussed importance of him weighing himself daily at the same time Will place referral to [place] for Heart Failure management (referral already placed by [facility] RN) Will plan home visit for medication management next week to assess medication management/adherence  Bennye Alm, PharmD, [place]  PGY2 Pharmacy Resident [phone]

## 2016-12-24 ENCOUNTER — Other Ambulatory Visit: Payer: Self-pay | Admitting: *Deleted

## 2016-12-24 ENCOUNTER — Encounter: Payer: Self-pay | Admitting: *Deleted

## 2016-12-24 ENCOUNTER — Ambulatory Visit: Payer: Self-pay | Admitting: Pharmacist

## 2016-12-24 NOTE — Patient Outreach (Signed)
4:25 pm,4:26 pm -Attempt made to contact pt for transition of care, called home and mobile number, message on phones - voice message not set up.   4:27 pm Received a return call from spouse to which RN CM requested to speak with pt.   Spoke with pt, HIPAA/identity verified, discussed purpose of call - received a referral to follow for transition of care, recent hospitalization 1/5-1/8 HF, [facility] ED visit 1/10- fluid overload.  Pt reports doing good, been out shopping all day today.  Pt reports  chest pain(little), sob only  when he over exerts himself.  Pt reports weight today is 235 lbs, no edema, misses one day a week in weighing, knows to call MD for weight gain of >3 lbs in a day, 5 lbs in a week.  Pt reports he saw Dr. Netty Starring  1/16, MD was concerned about his kidneys, took him off Metformin, to follow up with Heart MD 1/22.   Pt reports taking all of his medications as ordered,no problems affording, most of his medications come from the [place].  RN CM  Discussed with pt THN transition of care program- follow 31 days (weekly phone calls, a home visit) no cost to him to which pt agreed.    Plan:  As discussed with pt, plan to follow up again next week- home visit.           Plan to send barrier letter to Dr. Netty Starring informing of [facility] involvement.    Zara Chess.   [place] Care Management  [phone]

## 2016-12-27 DIAGNOSIS — I10 Essential (primary) hypertension: Secondary | ICD-10-CM | POA: Diagnosis not present

## 2016-12-27 DIAGNOSIS — I5023 Acute on chronic systolic (congestive) heart failure: Secondary | ICD-10-CM | POA: Diagnosis not present

## 2016-12-27 DIAGNOSIS — G4733 Obstructive sleep apnea (adult) (pediatric): Secondary | ICD-10-CM | POA: Diagnosis not present

## 2016-12-27 DIAGNOSIS — I25810 Atherosclerosis of coronary artery bypass graft(s) without angina pectoris: Secondary | ICD-10-CM | POA: Diagnosis not present

## 2016-12-28 ENCOUNTER — Encounter: Payer: Self-pay | Admitting: Pharmacist

## 2016-12-28 ENCOUNTER — Other Ambulatory Visit: Payer: Self-pay | Admitting: Pharmacist

## 2016-12-29 NOTE — Patient Outreach (Signed)
[name]) Care Management  [place]   12/29/2016  [name] [birth year] [id number]  Subjective:  71 year old male referred to [facility] for medication management/adherence. Patient had visit with Cardiology yesterday and with primary care last week.    Son requests portable O2 tank for power outages and trips or as needed. Family states that the patient may not have as many ED visits if he had portable oxygen as needed.     Patient endorses SOB when exerts himself which includes walking around the house.  Reports his albuterol does help when short of breath.   Patient cannot sleep at night without O2 5L at night and CPAP.  7-day CBG average: 141 14-day CBG average: 143 28-day CBG average: 149 Patient denies hypoglycemia. Patient reports that he "feels bad" when sugars are low and always checks his sugars when he feels like his sugars are low. Patient denies nocturia; only gets up to use the bathroom about once/week. 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  Recent weights: 1/20: 236.2 lbs. 1/21: 236.6 lbs. 1/22: 238.0 lbs. 1/23: 235.8 lbs. Patient reports that he tries to weigh everyday with the exception of when he is on vacation. Patient reports that he and his wife try to buy low sodium foods when available. He denies salting his food. Wife reports that she washes all their vegetables. He and his go to [employer] frequently but try to bring their own food most of the time and only order food there about once a week. Patient reports that his fluids consist of Diet coke and water. Typical day includes 2 medium fountain diet cokes and 2 glasses of water. Patient verbalized understand of keeping fluid intake under 2 L/day.  Patient has all medications stored on dresser in bedroom. Patient has a system of storing morning, bedtime, and twice daily medication separately. Patient was able to explain how he takes  each of his medications. Patient had a large supply of most of his medications as the [place] ships several months of medications at a time. Several bottles of medication were expired; these bottles were taken to be discarded.   Patient was recently discharged from hospital and all medications have been reviewed.  Objective:   Vitals:   12/28/16 1508  BP: 111/70  Pulse: 68   12/21/16: Potassium 4.6 Magnesium 2.1 SCr 1.5  11/26/16: A1c 5.6%  03/12/16: LVEF 20%  Encounter Medications: Outpatient Encounter Prescriptions as of 12/28/2016  Medication Sig  . acetaminophen (TYLENOL) 325 MG tablet Take 2 tablets (650 mg total) by mouth every 6 (six) hours as needed for mild pain (or Fever >/= 101).  Marland Kitchen albuterol (PROVENTIL HFA;VENTOLIN HFA) 108 (90 Base) MCG/ACT inhaler Inhale 2 puffs into the lungs 4 (four) times daily as needed for wheezing or shortness of breath.   Marland Kitchen albuterol-ipratropium (COMBIVENT) 18-103 MCG/ACT inhaler Inhale 1 puff into the lungs 2 times daily at 12 noon and 4 pm.  . aspirin 81 MG tablet Take 81 mg by mouth every morning.  Marland Kitchen atorvastatin (LIPITOR) 40 MG tablet Take 80 mg by mouth every evening.   . budesonide-formoterol (SYMBICORT) 80-4.5 MCG/ACT inhaler Inhale 2 puffs into the lungs 2 (two) times daily.  . calcium carbonate (TUMS - DOSED IN MG ELEMENTAL CALCIUM) 500 MG chewable tablet Chew 1 tablet by mouth as needed for indigestion or heartburn.  . carvedilol (COREG) 6.25 MG tablet Take 6.25 mg by mouth 2 (two)  times daily with a meal.  . clopidogrel (PLAVIX) 75 MG tablet Take 75 mg by mouth every morning.  . ferrous sulfate 325 (65 FE) MG tablet Take 325 mg by mouth 2 (two) times daily with a meal.  . finasteride (PROSCAR) 5 MG tablet Take 5 mg by mouth at bedtime.  Marland Kitchen FLUoxetine (PROZAC) 20 MG capsule Take 20 mg by mouth daily.  Marland Kitchen gabapentin (NEURONTIN) 300 MG capsule Take 300 mg by mouth 2 (two) times daily.   . insulin glargine (LANTUS) 100 UNIT/ML injection Inject  60 Units into the skin at bedtime.  . insulin lispro (HUMALOG) 100 UNIT/ML KiwkPen Inject 8-14 Units into the skin 3 (three) times daily with meals. Per sliding scale  . lansoprazole (PREVACID) 15 MG capsule Take 30 mg by mouth 2 (two) times daily.  Marland Kitchen lisinopril (PRINIVIL,ZESTRIL) 20 MG tablet Take 20 mg by mouth daily.   Marland Kitchen loratadine (CLARITIN) 10 MG tablet Take 10 mg by mouth daily.  . Multiple Vitamins-Minerals (CENTRUM SILVER PO) Take 1 tablet by mouth every morning.  . nitroGLYCERIN (NITROSTAT) 0.4 MG SL tablet Place 0.4 mg under the tongue every 5 (five) minutes as needed for chest pain.  . ranitidine (ZANTAC) 150 MG tablet Take 150 mg by mouth daily as needed for heartburn.  . ranolazine (RANEXA) 500 MG 12 hr tablet Take 500 mg by mouth 2 (two) times daily.  Marland Kitchen spironolactone (ALDACTONE) 25 MG tablet Take 12.5 mg by mouth daily.  . tamsulosin (FLOMAX) 0.4 MG CAPS capsule Take 0.4 mg by mouth daily after supper.  . torsemide (DEMADEX) 20 MG tablet Take 40 mg by mouth daily. May take 1-2 tablets as needed for fluid  . isosorbide mononitrate (IMDUR) 60 MG 24 hr tablet Take 60 mg by mouth daily.  . [DISCONTINUED] insulin glargine (LANTUS) 100 UNIT/ML injection Inject 0.75 mLs (75 Units total) into the skin at bedtime. (Patient taking differently: Inject 60 Units into the skin at bedtime. )   No facility-administered encounter medications on file as of 12/28/2016.     Functional Status: In your present state of health, do you have any difficulty performing the following activities: 10/19/2016 04/05/2016  Hearing? N Y  Vision? N N  Difficulty concentrating or making decisions? N N  Walking or climbing stairs? - Y  Dressing or bathing? N N  Doing errands, shopping? Y N  Preparing Food and eating ? Y -  Using the Toilet? N -  In the past six months, have you accidently leaked urine? N -  Do you have problems with loss of bowel control? N -  Managing your Medications? N -  Managing your  Finances? N -  Housekeeping or managing your Housekeeping? Y -  Some recent data might be hidden    Fall/Depression Screening: PHQ 2/9 Scores 10/19/2016 09/09/2016 07/30/2016 04/28/2016 04/05/2016 02/13/2016  PHQ - 2 Score 0 0 5 6 0 0  PHQ- 9 Score - - 15 14 - -    Assessment:  Drugs sorted by system:  Neurologic/Psychologic: Fluoxetine Cardiovascular: Aspirin, Atorvastatin, Carvedilol, Clopidogrel, Isosorbide (not taking), Lisinopril, Nitroglycerin, Ranolazine, Spironolactone, Torsemide Pulmonary/Allergy: Albuterol, Combivent (albuterol/ipratropium), Symbicort Gastrointestinal: Lansoprazole, Ranitidine Endocrine: Lantus, Humalog Renal: Finasteride, Tamsulosin Pain: Acetaminophen, Gabapentin Vitamins/Minerals: Calcium Carbonate, Ferrous Sulfate, Centrum Silver: Miscellaneous: Loratadine  Duplications in therapy: Albuterol and Combivent Gaps in therapy: Long acting muscarinic antagonist in COPD   Medication Adherence: Patient reports adherence with medications. Diabetes: well controlled with CBGs < 150 and A1c < 7%. Denies hypoglycemia. Heart failure:  Patient is still experiencing significant shortness of breath related to COPD/Heart Failure. Patient skips daily weights on occasion. Patient unsure if still supposed to be taking Isosorbide, not currently taking. COPD: Patient is still experiencing significant shortness of breath related to COPD/Heart Failure. Patient on duplicate therapy with albuterol and combivent. Needs additional therapy with [facility].  Plan: Encouraged patient to check his weight daily and to take his scale with him when he goes on vacation. Recommend discontinuing Combivent and adding Spiriva once daily. Recommend oxygen supply be provided for as needed.  Encouraged patient to monitor and limit sodium intake to less than 2 grams/day and limit fluid intake to less than 2 L per day. Pill boxes were provided to the patient for when he goes on vacation. Follow up via  telephone in 2 weeks. Will clarify with cardiologist if patient is supposed to continue Isosorbide.    Bennye Alm, PharmD, [place] PGY2 Pharmacy Resident [phone]

## 2016-12-30 ENCOUNTER — Other Ambulatory Visit: Payer: Self-pay | Admitting: *Deleted

## 2016-12-30 NOTE — Patient Outreach (Signed)
Follow up phone call successful - reschedule home visit, ongoing  follow up on recent hospitalization 1/5-1/8 HF, [facility] ED visit 1/10- fluid overload.   Spoke with pt, HIPAA/identity verified.   Pt reports weights are good, maintaining, did not weigh today, yesterday was 235 lbs.  Pt reports on visit with Dr. Nehemiah Massed 1/22, no changes in medications, good report.   Pt requested RN CM talk to spouse about rescheduling home visit.      Plan:  As discussed with pt's spouse, plan to follow up again with pt next week- initial home visit (part of ongoing transition of care).    [name].   [place] Care Management  [phone]

## 2016-12-30 NOTE — Patient Outreach (Signed)
[facility]) Care Management  12/30/2016  [name] 11-02-1946 [id number]   Arrived at pt's home for scheduled home visit, rang doorbell, knocked on door several times, no response.   Called pt's home phone number, HIPAA compliant voice message left with contact name and number.   Also called pt's mobile number, was able to reach pt to which he forgot about home visit.    Plan: as discussed, plan to follow up later today to reschedule home visit.    Zara Chess.   [place] Care Management  [phone]

## 2017-01-03 ENCOUNTER — Other Ambulatory Visit: Payer: Self-pay | Admitting: *Deleted

## 2017-01-03 ENCOUNTER — Encounter: Payer: Self-pay | Admitting: *Deleted

## 2017-01-03 NOTE — Patient Outreach (Addendum)
[facility]) Care Management   01/03/2017  [name] [birth year] [id number]  [name] is an 71 y.o. male  Subjective: Pt reports getting better, no c/o sob or pain today.  Pt reports weighs most days, did not weigh today,  Yesterday was 237.4 lbs.    Pt reports goes to [employer] every morning, brings his own oatmeal, does not add  Any salt to his food.  Pt reports he was told EF was 20 %.   Pt reports continues with CPAP with 5 liters of oxygen At night, does not need oxygen during the day but both he and son  would like to have back up portable oxygen tanks For power outage, going to [facility] ED when in crisis.  Pt reports he was told not eligible.     Objective:   Vitals:   01/03/17 1439  BP: (!) 110/52  Pulse: 72  Resp: 16    ROS  Physical Exam  Constitutional: He is oriented to person, place, and time. He appears well-developed and well-nourished.  Cardiovascular: Normal rate, regular rhythm and normal heart sounds.   Respiratory: Effort normal and breath sounds normal.  GI: Soft. Bowel sounds are normal. He exhibits no distension.  Abdomen distended, soft.   Musculoskeletal: Normal range of motion. He exhibits no edema.  Neurological: He is alert and oriented to person, place, and time.  Skin: Skin is warm and dry.  Psychiatric: He has a normal mood and affect. His behavior is normal. Judgment and thought content normal.    Encounter Medications:   Outpatient Encounter Prescriptions as of 01/03/2017  Medication Sig Note  . acetaminophen (TYLENOL) 325 MG tablet Take 2 tablets (650 mg total) by mouth every 6 (six) hours as needed for mild pain (or Fever >/= 101). 01/03/2017: As needed   . albuterol (PROVENTIL HFA;VENTOLIN HFA) 108 (90 Base) MCG/ACT inhaler Inhale 2 puffs into the lungs 4 (four) times daily as needed for wheezing or shortness of breath.  01/03/2017: As needed.   Marland Kitchen albuterol-ipratropium (COMBIVENT) 18-103 MCG/ACT inhaler Inhale 1 puff  into the lungs 2 times daily at 12 noon and 4 pm.   . aspirin 81 MG tablet Take 81 mg by mouth every morning.   Marland Kitchen atorvastatin (LIPITOR) 40 MG tablet Take 80 mg by mouth every evening.    . budesonide-formoterol (SYMBICORT) 80-4.5 MCG/ACT inhaler Inhale 2 puffs into the lungs 2 (two) times daily.   . calcium carbonate (TUMS - DOSED IN MG ELEMENTAL CALCIUM) 500 MG chewable tablet Chew 1 tablet by mouth as needed for indigestion or heartburn. 01/03/2017: As needed   . carvedilol (COREG) 6.25 MG tablet Take 6.25 mg by mouth 2 (two) times daily with a meal.   . clopidogrel (PLAVIX) 75 MG tablet Take 75 mg by mouth every morning.   . cyanocobalamin 1000 MCG tablet Take 1,000 mcg by mouth daily.   . ferrous sulfate 325 (65 FE) MG tablet Take 325 mg by mouth 2 (two) times daily with a meal.   . finasteride (PROSCAR) 5 MG tablet Take 5 mg by mouth at bedtime.   Marland Kitchen FLUoxetine (PROZAC) 20 MG capsule Take 20 mg by mouth daily.   Marland Kitchen gabapentin (NEURONTIN) 300 MG capsule Take 300 mg by mouth 2 (two) times daily.    . insulin glargine (LANTUS) 100 UNIT/ML injection Inject 60 Units into the skin at bedtime.   . insulin lispro (HUMALOG) 100 UNIT/ML KiwkPen Inject 8-14 Units into the skin 3 (three) times  daily with meals. Per sliding scale   . lansoprazole (PREVACID) 15 MG capsule Take 30 mg by mouth 2 (two) times daily.   Marland Kitchen lisinopril (PRINIVIL,ZESTRIL) 20 MG tablet Take 20 mg by mouth daily.    Marland Kitchen loratadine (CLARITIN) 10 MG tablet Take 10 mg by mouth daily.   . Multiple Vitamins-Minerals (CENTRUM SILVER PO) Take 1 tablet by mouth every morning.   . nitroGLYCERIN (NITROSTAT) 0.4 MG SL tablet Place 0.4 mg under the tongue every 5 (five) minutes as needed for chest pain.   . ranolazine (RANEXA) 500 MG 12 hr tablet Take 500 mg by mouth 2 (two) times daily.   Marland Kitchen spironolactone (ALDACTONE) 25 MG tablet Take 12.5 mg by mouth daily.   . tamsulosin (FLOMAX) 0.4 MG CAPS capsule Take 0.4 mg by mouth daily after supper.   .  torsemide (DEMADEX) 20 MG tablet Take 40 mg by mouth daily. May take 1-2 tablets as needed for fluid   . Ubiquinol 100 MG CAPS Take by mouth. daily   . isosorbide mononitrate (IMDUR) 60 MG 24 hr tablet Take 60 mg by mouth daily.   . ranitidine (ZANTAC) 150 MG tablet Take 150 mg by mouth daily as needed for heartburn.    No facility-administered encounter medications on file as of 01/03/2017.     Functional Status:   In your present state of health, do you have any difficulty performing the following activities: 01/03/2017 10/19/2016  Hearing? N N  Vision? N N  Difficulty concentrating or making decisions? N N  Walking or climbing stairs? Y -  Dressing or bathing? N N  Doing errands, shopping? N Y  Conservation officer, nature and eating ? N Y  Using the Toilet? N N  In the past six months, have you accidently leaked urine? N N  Do you have problems with loss of bowel control? N N  Managing your Medications? N N  Managing your Finances? N N  Housekeeping or managing your Housekeeping? N Y  Some recent data might be hidden    Fall/Depression Screening:    PHQ 2/9 Scores 10/19/2016 09/09/2016 07/30/2016 04/28/2016 04/05/2016 02/13/2016  PHQ - 2 Score 0 0 5 6 0 0  PHQ- 9 Score - - 15 14 - -    Assessment:  Pleasant 71 year old gentleman,resides with spouse.  Spouse/son present during home  Visit.  HF:   Lungs clear, no edema in legs/feet.  Abdomen distended, soft.   Review of pt's weights 233.8 lbs to  238 lbs, yesterday 237.4 lbs.   Oxygen saturation at rest 98%, ambulating 98%.   As discussed, [facility]  [facility].   DM: per pt, sugars range 100-150, yesterday 121.   Plan:  RN CM to continue to follow pt for transition of care, as discussed with pt plan to follow up  Again next week telephonically.            Plan to fax Dr. Netty Starring 1/29 home visit encounter.    [facility] CM Care Plan Problem One   Flowsheet Row Most Recent Value  Care Plan Problem One   Risk for readmission related to recent hospitalization - Congestive Heart Failure   Role Documenting the Problem One  Care Management [place] for Problem One  Active  [facility] Long Term Goal (31-90 days)  Pt would  not readmit to the hospital within the next 31 days   [facility] Long Term Goal  Start Date  12/24/16  Interventions for Problem One Long Term Goal  Initial home visit done, medications, HF zones reviewed.   [facility] CM Short Term Goal #1 (0-30 days)  Pt would not have a weight gain of >3 lbs in a day, 5 lbs in a week   [facility] CM Short Term Goal #1 Start Date  12/24/16  Interventions for Short Term Goal #1  reveiwed with pt recent weights   [facility] CM Short Term Goal #2 (0-30 days)  Pt would keep all MD appointments within the next 30 days   [facility] CM Short Term Goal #2 Start Date  12/24/16  [facility] CM Short Term Goal #2 Met Date  01/03/17     Zara Chess.   [place] Care Management  [phone]

## 2017-01-07 ENCOUNTER — Ambulatory Visit: Payer: PPO | Admitting: *Deleted

## 2017-01-11 ENCOUNTER — Other Ambulatory Visit: Payer: Self-pay | Admitting: *Deleted

## 2017-01-11 NOTE — Patient Outreach (Signed)
Attempt made to contact pt as part of ongoing transition of care- recent hospitalization 1/5-1/8 CHF, [facility] ED visit 1/10- fluid overload.   HIPAA compliant voice message left with contact name and number.    Plan: if no response, plan to follow up again within 1-2 days.    Zara Chess.   [place] Care Management  [phone]

## 2017-01-11 NOTE — Patient Outreach (Signed)
Received a return phone call from pt's spouse (on consent form) to call made earlier by RN CM.   Spoke with pt, HIPAA/identity verified, reports doing good, no edema/sob, did not weigh today, weight yesterday 236.4 lbs, weights ranging 236.5 to 236.8 lbs. , watching his salt.  Pt reports sugars are fine, in the 120 range.     Plan:  As discussed with pt, plan to follow up again next week telephonically (part of ongoing transition of care).    Zara Chess.   [place] Care Management  [phone]

## 2017-01-12 ENCOUNTER — Ambulatory Visit: Payer: Self-pay | Admitting: Pharmacist

## 2017-01-14 ENCOUNTER — Ambulatory Visit: Payer: Self-pay | Admitting: Pharmacist

## 2017-01-18 ENCOUNTER — Other Ambulatory Visit: Payer: Self-pay | Admitting: *Deleted

## 2017-01-18 ENCOUNTER — Ambulatory Visit: Payer: Self-pay | Admitting: Pharmacist

## 2017-01-18 NOTE — Patient Outreach (Signed)
Transition of care call successful with  pt's mobile phone, home phone unable to leave a voice message.   RN CM - ongoing follow up on recent hospitalization 1/5-1/8 CHF, [facility] ED visit 1/10 fluid overload.    Spoke with pt, HIPAA verified.   Pt reports doing good, weights staying same (236 lbs), most weight gain in a day was 1.5 lbs,  No edema or sob. Pt reports trying to adhere to Low Na+ diet.   Pt reports sugar was high this am- 195, took sliding scale insulin, been in the 130's.  Pt reports on upcoming visit with Dr. Raul Del 2/19.   Pt reports was given 2 oxygen tanks by a family member, date on tanks is 2014.   RN CM reinforced safety concern using old tanks to which pt voiced understanding, trying to get someone to test them first.    Plan:  As discussed with pt, RN CM to follow up again next week telephonically.   This will be final transition of care call.   Zara Chess.   [place] Care Management  [phone]

## 2017-01-19 ENCOUNTER — Other Ambulatory Visit: Payer: Self-pay | Admitting: Pharmacist

## 2017-01-19 NOTE — Patient Outreach (Signed)
[facility]) Care Management  01/19/2017  [name] November 24, 1946 [id number]   71 year old male referred to [facility] for medication management/adherence. Per referral patient has past medical history of heart failure and diabetes and currently on >20 medications. Patient recently discharged from [facility] for heart failure exacerbation from 12/10/16 to 12/13/16. Patient was then seen at [facility] Emergency department on 12/15/2016 for fluid overload then discharged.  Called patient today for 2 week followup. He denies medication changes and states he has followup with pulmonology next Tuesday.   Denies shortness of breath because he hasnt been moving as much due to weather.  Denies weight fluctuations > 3 lbs/day or 5 lbs/week.  Denies edema in lower extremities or stomach area.    Reports Weight today: 237.4 lb Denies missing his medications and is not currently checking home blood pressure readings.    Outpatient Encounter Prescriptions as of 01/19/2017  Medication Sig  . acetaminophen (TYLENOL) 325 MG tablet Take 2 tablets (650 mg total) by mouth every 6 (six) hours as needed for mild pain (or Fever >/= 101).  Marland Kitchen albuterol (PROVENTIL HFA;VENTOLIN HFA) 108 (90 Base) MCG/ACT inhaler Inhale 2 puffs into the lungs 4 (four) times daily as needed for wheezing or shortness of breath.   Marland Kitchen albuterol-ipratropium (COMBIVENT) 18-103 MCG/ACT inhaler Inhale 1 puff into the lungs 2 times daily at 12 noon and 4 pm.  . aspirin 81 MG tablet Take 81 mg by mouth every morning.  Marland Kitchen atorvastatin (LIPITOR) 40 MG tablet Take 80 mg by mouth every evening.   . budesonide-formoterol (SYMBICORT) 80-4.5 MCG/ACT inhaler Inhale 2 puffs into the lungs 2 (two) times daily.  . calcium carbonate (TUMS - DOSED IN MG ELEMENTAL CALCIUM) 500 MG chewable tablet Chew 1 tablet by mouth as needed for indigestion or heartburn.  . carvedilol (COREG) 6.25 MG tablet Take 6.25 mg by mouth 2 (two) times daily  with a meal.  . clopidogrel (PLAVIX) 75 MG tablet Take 75 mg by mouth every morning.  . cyanocobalamin 1000 MCG tablet Take 1,000 mcg by mouth daily.  . ferrous sulfate 325 (65 FE) MG tablet Take 325 mg by mouth 2 (two) times daily with a meal.  . finasteride (PROSCAR) 5 MG tablet Take 5 mg by mouth at bedtime.  Marland Kitchen FLUoxetine (PROZAC) 20 MG capsule Take 20 mg by mouth daily.  Marland Kitchen gabapentin (NEURONTIN) 300 MG capsule Take 300 mg by mouth 2 (two) times daily.   . insulin glargine (LANTUS) 100 UNIT/ML injection Inject 60 Units into the skin at bedtime.  . insulin lispro (HUMALOG) 100 UNIT/ML KiwkPen Inject 8-14 Units into the skin 3 (three) times daily with meals. Per sliding scale  . lansoprazole (PREVACID) 15 MG capsule Take 30 mg by mouth 2 (two) times daily.  Marland Kitchen lisinopril (PRINIVIL,ZESTRIL) 20 MG tablet Take 20 mg by mouth daily.   Marland Kitchen loratadine (CLARITIN) 10 MG tablet Take 10 mg by mouth daily.  . Multiple Vitamins-Minerals (CENTRUM SILVER PO) Take 1 tablet by mouth every morning.  . nitroGLYCERIN (NITROSTAT) 0.4 MG SL tablet Place 0.4 mg under the tongue every 5 (five) minutes as needed for chest pain.  . ranitidine (ZANTAC) 150 MG tablet Take 150 mg by mouth daily as needed for heartburn.  . ranolazine (RANEXA) 500 MG 12 hr tablet Take 500 mg by mouth 2 (two) times daily.  Marland Kitchen spironolactone (ALDACTONE) 25 MG tablet Take 12.5 mg by mouth daily.  . tamsulosin (FLOMAX) 0.4 MG CAPS  capsule Take 0.4 mg by mouth daily after supper.  . torsemide (DEMADEX) 20 MG tablet Take 40 mg by mouth daily. May take 1-2 tablets as needed for fluid  . Ubiquinol 100 MG CAPS Take by mouth. daily  . isosorbide mononitrate (IMDUR) 60 MG 24 hr tablet Take 60 mg by mouth daily.   No facility-administered encounter medications on file as of 01/19/2017.     Plan: Will resent notification to cardiologist [name]) that patient is not currently taking isosorbide. Will send recommendation to pulmonology [name]) to consider addition of Spiriva and discontinuation of Combivent as patient has duplicate short acting beta agonists with no long acting antimuscarinic.   [facility] pharmacy will not open program at this time and will sign off.  Please reconsult if needed.   Bennye Alm, PharmD, [place] PGY2 Pharmacy Resident [phone]

## 2017-01-24 ENCOUNTER — Other Ambulatory Visit: Payer: Self-pay | Admitting: *Deleted

## 2017-01-24 NOTE — Patient Outreach (Signed)
Final transition of care call successful  calling pt's  mobile phone after leaving message on his home phone.  Ongoing follow up on recent hospitalization 1/5-1/8 CHF, [facility] and [facility] ED 1/10 for fluid overload then discharged.  Spoke with pt, HIPAA/identity verified.  Pt reports doing good, no weight gain, weight today 239.6 lbs to which RN CM discussed on 2/13 weights staying at 236 lbs.  Pt reports he had a good weekend, weight gain result of eating more, not fluid.  Pt reports no sob, chest pain, edema.  Pt reports he cancelled appointment with Dr. Raul Del, did not reschedule, only saw MD one time in the past.   Pt reports the weather is keeping him inside.  Pt reports sugar today was 135, only had one day where it was high <200.   RN CM discussed with pt today is  final transition of care call, plan to follow up again next month telephonically.  Plan: As discussed with pt, plan to follow up again next month telephonically (monitor pt's ongoing self management of HF).    Zara Chess.   [place] Care Management  [phone]

## 2017-02-24 ENCOUNTER — Encounter: Payer: Self-pay | Admitting: *Deleted

## 2017-02-24 ENCOUNTER — Other Ambulatory Visit: Payer: Self-pay | Admitting: *Deleted

## 2017-02-24 NOTE — Patient Outreach (Addendum)
Follow up call successful, check on pt's self management of CHF, transition of care program completed on pt last month (recent hospitalization 1/5-1/8 for CHF, [facility] ED visit 1/10 for fluid overload).  Spoke with pt, HIPAA verified.  Pt reports doing good, [place] the beach.  Pt reports refraining from eating a lot, forgot to bring his scale, last weight  was 238 lbs on 3/19, no weight gain, no edema/sob/chest pain.  Pt reports sugar was high this am - 200, been staying under 200, been staying away from sweets, watching carbohydrates.  Pt reports no recent MD visits.    Plan:  As discussed with pt, plan to discharge from community nurse case management services, goals met.              Plan to send case closure letter to Dr. Netty Starring            Plan to inform [facility] care management assistant to             Close case.    Zara Chess.   [facility]  [phone]

## 2017-03-16 DIAGNOSIS — E1142 Type 2 diabetes mellitus with diabetic polyneuropathy: Secondary | ICD-10-CM | POA: Diagnosis not present

## 2017-03-23 DIAGNOSIS — Z794 Long term (current) use of insulin: Secondary | ICD-10-CM | POA: Diagnosis not present

## 2017-03-23 DIAGNOSIS — E1142 Type 2 diabetes mellitus with diabetic polyneuropathy: Secondary | ICD-10-CM | POA: Diagnosis not present

## 2017-03-23 DIAGNOSIS — E1159 Type 2 diabetes mellitus with other circulatory complications: Secondary | ICD-10-CM | POA: Diagnosis not present

## 2017-03-23 DIAGNOSIS — I429 Cardiomyopathy, unspecified: Secondary | ICD-10-CM | POA: Diagnosis not present

## 2017-03-23 DIAGNOSIS — I509 Heart failure, unspecified: Secondary | ICD-10-CM | POA: Diagnosis not present

## 2017-03-23 DIAGNOSIS — I2510 Atherosclerotic heart disease of native coronary artery without angina pectoris: Secondary | ICD-10-CM | POA: Diagnosis not present

## 2017-04-19 ENCOUNTER — Emergency Department: Payer: PPO

## 2017-04-19 ENCOUNTER — Encounter: Payer: Self-pay | Admitting: Emergency Medicine

## 2017-04-19 ENCOUNTER — Inpatient Hospital Stay
Admission: EM | Admit: 2017-04-19 | Discharge: 2017-04-21 | DRG: 291 | Disposition: A | Discharge status: Home / Self Care | Payer: PPO | Attending: Internal Medicine | Admitting: Internal Medicine

## 2017-04-19 DIAGNOSIS — Z794 Long term (current) use of insulin: Secondary | ICD-10-CM

## 2017-04-19 DIAGNOSIS — I2510 Atherosclerotic heart disease of native coronary artery without angina pectoris: Secondary | ICD-10-CM | POA: Diagnosis not present

## 2017-04-19 DIAGNOSIS — J449 Chronic obstructive pulmonary disease, unspecified: Secondary | ICD-10-CM | POA: Diagnosis present

## 2017-04-19 DIAGNOSIS — I5023 Acute on chronic systolic (congestive) heart failure: Secondary | ICD-10-CM | POA: Diagnosis not present

## 2017-04-19 DIAGNOSIS — E7800 Pure hypercholesterolemia, unspecified: Secondary | ICD-10-CM | POA: Diagnosis present

## 2017-04-19 DIAGNOSIS — Z87891 Personal history of nicotine dependence: Secondary | ICD-10-CM

## 2017-04-19 DIAGNOSIS — Z7951 Long term (current) use of inhaled steroids: Secondary | ICD-10-CM

## 2017-04-19 DIAGNOSIS — N189 Chronic kidney disease, unspecified: Secondary | ICD-10-CM | POA: Diagnosis present

## 2017-04-19 DIAGNOSIS — K219 Gastro-esophageal reflux disease without esophagitis: Secondary | ICD-10-CM | POA: Diagnosis not present

## 2017-04-19 DIAGNOSIS — E876 Hypokalemia: Secondary | ICD-10-CM | POA: Diagnosis not present

## 2017-04-19 DIAGNOSIS — E785 Hyperlipidemia, unspecified: Secondary | ICD-10-CM | POA: Diagnosis not present

## 2017-04-19 DIAGNOSIS — Z888 Allergy status to other drugs, medicaments and biological substances status: Secondary | ICD-10-CM | POA: Diagnosis not present

## 2017-04-19 DIAGNOSIS — Z8546 Personal history of malignant neoplasm of prostate: Secondary | ICD-10-CM | POA: Diagnosis not present

## 2017-04-19 DIAGNOSIS — E1122 Type 2 diabetes mellitus with diabetic chronic kidney disease: Secondary | ICD-10-CM | POA: Diagnosis not present

## 2017-04-19 DIAGNOSIS — I255 Ischemic cardiomyopathy: Secondary | ICD-10-CM | POA: Diagnosis present

## 2017-04-19 DIAGNOSIS — J9621 Acute and chronic respiratory failure with hypoxia: Secondary | ICD-10-CM | POA: Diagnosis not present

## 2017-04-19 DIAGNOSIS — I509 Heart failure, unspecified: Secondary | ICD-10-CM | POA: Diagnosis not present

## 2017-04-19 DIAGNOSIS — Z95810 Presence of automatic (implantable) cardiac defibrillator: Secondary | ICD-10-CM

## 2017-04-19 DIAGNOSIS — Z7982 Long term (current) use of aspirin: Secondary | ICD-10-CM | POA: Diagnosis not present

## 2017-04-19 DIAGNOSIS — I252 Old myocardial infarction: Secondary | ICD-10-CM | POA: Diagnosis not present

## 2017-04-19 DIAGNOSIS — I130 Hypertensive heart and chronic kidney disease with heart failure and stage 1 through stage 4 chronic kidney disease, or unspecified chronic kidney disease: Secondary | ICD-10-CM | POA: Diagnosis not present

## 2017-04-19 DIAGNOSIS — Z881 Allergy status to other antibiotic agents status: Secondary | ICD-10-CM | POA: Diagnosis not present

## 2017-04-19 DIAGNOSIS — R0600 Dyspnea, unspecified: Secondary | ICD-10-CM | POA: Diagnosis not present

## 2017-04-19 DIAGNOSIS — I10 Essential (primary) hypertension: Secondary | ICD-10-CM

## 2017-04-19 DIAGNOSIS — Z79899 Other long term (current) drug therapy: Secondary | ICD-10-CM

## 2017-04-19 DIAGNOSIS — I110 Hypertensive heart disease with heart failure: Secondary | ICD-10-CM | POA: Diagnosis not present

## 2017-04-19 DIAGNOSIS — R0602 Shortness of breath: Secondary | ICD-10-CM | POA: Diagnosis not present

## 2017-04-19 LAB — BASIC METABOLIC PANEL
Anion gap: 10 (ref 5–15)
BUN: 17 mg/dL (ref 6–20)
CHLORIDE: 101 mmol/L (ref 101–111)
CO2: 27 mmol/L (ref 22–32)
Calcium: 8.2 mg/dL — ABNORMAL LOW (ref 8.9–10.3)
Creatinine, Ser: 1.39 mg/dL — ABNORMAL HIGH (ref 0.61–1.24)
GFR calc non Af Amer: 49 mL/min — ABNORMAL LOW (ref >60)
GFR, EST AFRICAN AMERICAN: 57 mL/min — AB (ref >60)
GLUCOSE: 211 mg/dL — AB (ref 65–99)
Potassium: 3.1 mmol/L — ABNORMAL LOW (ref 3.5–5.1)
Sodium: 138 mmol/L (ref 135–145)

## 2017-04-19 LAB — CBC
HEMATOCRIT: 32.9 % — AB (ref 40.0–52.0)
HEMOGLOBIN: 11.5 g/dL — AB (ref 13.0–18.0)
MCH: 30.7 pg (ref 26.0–34.0)
MCHC: 34.9 g/dL (ref 32.0–36.0)
MCV: 87.9 fL (ref 80.0–100.0)
Platelets: 205 10*3/uL (ref 150–440)
RBC: 3.74 MIL/uL — ABNORMAL LOW (ref 4.40–5.90)
RDW: 14.9 % — AB (ref 11.5–14.5)
WBC: 9.6 10*3/uL (ref 3.8–10.6)

## 2017-04-19 LAB — TROPONIN I: Troponin I: <0.03 ng/mL (ref <0.03)

## 2017-04-19 LAB — BRAIN NATRIURETIC PEPTIDE: B NATRIURETIC PEPTIDE 5: 450 pg/mL — AB (ref 0.0–100.0)

## 2017-04-19 MED ORDER — DIPHENHYDRAMINE HCL 25 MG PO CAPS
ORAL_CAPSULE | ORAL | Status: AC
  Filled 2017-04-19: qty 2

## 2017-04-19 MED ORDER — NITROGLYCERIN 2 % TD OINT
1.0000 [in_us] | TOPICAL_OINTMENT | Freq: Once | TRANSDERMAL | Status: AC
  Administered 2017-04-19: 1 [in_us] via TOPICAL
  Filled 2017-04-19: qty 1

## 2017-04-19 MED ORDER — FUROSEMIDE 10 MG/ML IJ SOLN
40.0000 mg | Freq: Once | INTRAMUSCULAR | Status: AC
  Administered 2017-04-19: 40 mg via INTRAVENOUS
  Filled 2017-04-19: qty 4

## 2017-04-19 NOTE — ED Notes (Signed)
Radiology at bedside for CXR

## 2017-04-19 NOTE — ED Notes (Signed)
Family now at bedside

## 2017-04-19 NOTE — ED Notes (Signed)
RT at bedside at [place] placed

## 2017-04-19 NOTE — ED Triage Notes (Addendum)
Pt arrives to room 3 via stretcher/EMS from home; st onset SHOB at 8pm; took 2 NTG and 2-40mg  lasix at 9pm; placed self on cpap at 10pm and began feeling better but came to ED to be evaluated; pt denies any pain, only c/o [facility]; denies any recent illness or accomp symptoms; st normally wears O2 at 5l/min via [place] at night only; audible crackles and tachypnic; audible apical & regular, +BS, abd distended, nontender (pt reports swelling to abdomen recently), +PP; Dr Dahlia Client and RT at bedside

## 2017-04-20 ENCOUNTER — Encounter: Payer: Self-pay | Admitting: Internal Medicine

## 2017-04-20 ENCOUNTER — Inpatient Hospital Stay
Admit: 2017-04-20 | Discharge: 2017-04-20 | Disposition: A | Discharge status: Home / Self Care | Payer: PPO | Attending: Internal Medicine | Admitting: Internal Medicine

## 2017-04-20 DIAGNOSIS — I5023 Acute on chronic systolic (congestive) heart failure: Secondary | ICD-10-CM | POA: Diagnosis not present

## 2017-04-20 DIAGNOSIS — I255 Ischemic cardiomyopathy: Secondary | ICD-10-CM | POA: Diagnosis not present

## 2017-04-20 DIAGNOSIS — E7800 Pure hypercholesterolemia, unspecified: Secondary | ICD-10-CM | POA: Diagnosis not present

## 2017-04-20 DIAGNOSIS — E1122 Type 2 diabetes mellitus with diabetic chronic kidney disease: Secondary | ICD-10-CM | POA: Diagnosis not present

## 2017-04-20 DIAGNOSIS — Z8546 Personal history of malignant neoplasm of prostate: Secondary | ICD-10-CM | POA: Diagnosis not present

## 2017-04-20 DIAGNOSIS — Z87891 Personal history of nicotine dependence: Secondary | ICD-10-CM | POA: Diagnosis not present

## 2017-04-20 DIAGNOSIS — I25798 Atherosclerosis of other coronary artery bypass graft(s) with other forms of angina pectoris: Secondary | ICD-10-CM | POA: Diagnosis not present

## 2017-04-20 DIAGNOSIS — Z7951 Long term (current) use of inhaled steroids: Secondary | ICD-10-CM | POA: Diagnosis not present

## 2017-04-20 DIAGNOSIS — Z79899 Other long term (current) drug therapy: Secondary | ICD-10-CM | POA: Diagnosis not present

## 2017-04-20 DIAGNOSIS — K219 Gastro-esophageal reflux disease without esophagitis: Secondary | ICD-10-CM | POA: Diagnosis not present

## 2017-04-20 DIAGNOSIS — N189 Chronic kidney disease, unspecified: Secondary | ICD-10-CM | POA: Diagnosis not present

## 2017-04-20 DIAGNOSIS — Z881 Allergy status to other antibiotic agents status: Secondary | ICD-10-CM | POA: Diagnosis not present

## 2017-04-20 DIAGNOSIS — Z7982 Long term (current) use of aspirin: Secondary | ICD-10-CM | POA: Diagnosis not present

## 2017-04-20 DIAGNOSIS — J449 Chronic obstructive pulmonary disease, unspecified: Secondary | ICD-10-CM | POA: Diagnosis not present

## 2017-04-20 DIAGNOSIS — J9621 Acute and chronic respiratory failure with hypoxia: Secondary | ICD-10-CM | POA: Diagnosis not present

## 2017-04-20 DIAGNOSIS — E876 Hypokalemia: Secondary | ICD-10-CM | POA: Diagnosis not present

## 2017-04-20 DIAGNOSIS — J810 Acute pulmonary edema: Secondary | ICD-10-CM | POA: Diagnosis not present

## 2017-04-20 DIAGNOSIS — I2510 Atherosclerotic heart disease of native coronary artery without angina pectoris: Secondary | ICD-10-CM | POA: Diagnosis not present

## 2017-04-20 DIAGNOSIS — E785 Hyperlipidemia, unspecified: Secondary | ICD-10-CM | POA: Diagnosis not present

## 2017-04-20 DIAGNOSIS — I130 Hypertensive heart and chronic kidney disease with heart failure and stage 1 through stage 4 chronic kidney disease, or unspecified chronic kidney disease: Secondary | ICD-10-CM | POA: Diagnosis not present

## 2017-04-20 DIAGNOSIS — I509 Heart failure, unspecified: Secondary | ICD-10-CM | POA: Diagnosis not present

## 2017-04-20 DIAGNOSIS — I5022 Chronic systolic (congestive) heart failure: Secondary | ICD-10-CM

## 2017-04-20 DIAGNOSIS — Z888 Allergy status to other drugs, medicaments and biological substances status: Secondary | ICD-10-CM | POA: Diagnosis not present

## 2017-04-20 DIAGNOSIS — Z794 Long term (current) use of insulin: Secondary | ICD-10-CM | POA: Diagnosis not present

## 2017-04-20 DIAGNOSIS — Z95810 Presence of automatic (implantable) cardiac defibrillator: Secondary | ICD-10-CM | POA: Diagnosis not present

## 2017-04-20 DIAGNOSIS — I252 Old myocardial infarction: Secondary | ICD-10-CM | POA: Diagnosis not present

## 2017-04-20 LAB — GLUCOSE, CAPILLARY
GLUCOSE-CAPILLARY: 102 mg/dL — AB (ref 65–99)
GLUCOSE-CAPILLARY: 177 mg/dL — AB (ref 65–99)
Glucose-Capillary: 107 mg/dL — ABNORMAL HIGH (ref 65–99)
Glucose-Capillary: 157 mg/dL — ABNORMAL HIGH (ref 65–99)

## 2017-04-20 LAB — CREATININE, SERUM
Creatinine, Ser: 1.13 mg/dL (ref 0.61–1.24)
GFR calc Af Amer: >60 mL/min (ref >60)
GFR calc non Af Amer: >60 mL/min (ref >60)

## 2017-04-20 LAB — CBC
HCT: 30.3 % — ABNORMAL LOW (ref 40.0–52.0)
HEMOGLOBIN: 10.7 g/dL — AB (ref 13.0–18.0)
MCH: 31.2 pg (ref 26.0–34.0)
MCHC: 35.2 g/dL (ref 32.0–36.0)
MCV: 88.6 fL (ref 80.0–100.0)
Platelets: 173 10*3/uL (ref 150–440)
RBC: 3.42 MIL/uL — ABNORMAL LOW (ref 4.40–5.90)
RDW: 14.8 % — ABNORMAL HIGH (ref 11.5–14.5)
WBC: 7.8 10*3/uL (ref 3.8–10.6)

## 2017-04-20 LAB — MRSA PCR SCREENING: MRSA by PCR: NEGATIVE

## 2017-04-20 LAB — PHOSPHORUS: Phosphorus: 3.7 mg/dL (ref 2.5–4.6)

## 2017-04-20 LAB — POTASSIUM: POTASSIUM: 3 mmol/L — AB (ref 3.5–5.1)

## 2017-04-20 LAB — MAGNESIUM: Magnesium: 1.2 mg/dL — ABNORMAL LOW (ref 1.7–2.4)

## 2017-04-20 MED ORDER — ASPIRIN 81 MG PO CHEW
81.0000 mg | CHEWABLE_TABLET | ORAL | Status: DC
  Administered 2017-04-20 – 2017-04-21 (×2): 81 mg via ORAL
  Filled 2017-04-20 (×2): qty 1

## 2017-04-20 MED ORDER — POTASSIUM CHLORIDE CRYS ER 20 MEQ PO TBCR
40.0000 meq | EXTENDED_RELEASE_TABLET | Freq: Two times a day (BID) | ORAL | Status: DC
  Administered 2017-04-20: 40 meq via ORAL
  Filled 2017-04-20 (×2): qty 2

## 2017-04-20 MED ORDER — ISOSORBIDE MONONITRATE ER 60 MG PO TB24
60.0000 mg | ORAL_TABLET | Freq: Every day | ORAL | Status: DC
  Administered 2017-04-21: 60 mg via ORAL
  Filled 2017-04-20: qty 1

## 2017-04-20 MED ORDER — FAMOTIDINE 20 MG PO TABS
20.0000 mg | ORAL_TABLET | Freq: Every day | ORAL | Status: DC
  Administered 2017-04-20 – 2017-04-21 (×2): 20 mg via ORAL
  Filled 2017-04-20 (×2): qty 1

## 2017-04-20 MED ORDER — CHLORHEXIDINE GLUCONATE 0.12 % MT SOLN
15.0000 mL | Freq: Two times a day (BID) | OROMUCOSAL | Status: DC
  Administered 2017-04-20 – 2017-04-21 (×3): 15 mL via OROMUCOSAL
  Filled 2017-04-20 (×2): qty 15

## 2017-04-20 MED ORDER — INSULIN ASPART 100 UNIT/ML ~~LOC~~ SOLN: 0.0000 [IU] | Freq: Every day | SUBCUTANEOUS | Status: DC

## 2017-04-20 MED ORDER — MAGNESIUM SULFATE 4 GM/100ML IV SOLN
4.0000 g | Freq: Once | INTRAVENOUS | Status: AC
  Administered 2017-04-20: 4 g via INTRAVENOUS
  Filled 2017-04-20: qty 100

## 2017-04-20 MED ORDER — LISINOPRIL 20 MG PO TABS
20.0000 mg | ORAL_TABLET | Freq: Every day | ORAL | Status: DC
  Administered 2017-04-21: 20 mg via ORAL
  Filled 2017-04-20: qty 1

## 2017-04-20 MED ORDER — ALUM & MAG HYDROXIDE-SIMETH 200-200-20 MG/5ML PO SUSP
30.0000 mL | Freq: Four times a day (QID) | ORAL | Status: DC | PRN
  Administered 2017-04-20: 30 mL via ORAL
  Filled 2017-04-20: qty 30

## 2017-04-20 MED ORDER — CLOPIDOGREL BISULFATE 75 MG PO TABS
75.0000 mg | ORAL_TABLET | ORAL | Status: DC
  Administered 2017-04-20 – 2017-04-21 (×2): 75 mg via ORAL
  Filled 2017-04-20 (×2): qty 1

## 2017-04-20 MED ORDER — GABAPENTIN 300 MG PO CAPS
300.0000 mg | ORAL_CAPSULE | Freq: Two times a day (BID) | ORAL | Status: DC
  Administered 2017-04-20 – 2017-04-21 (×4): 300 mg via ORAL
  Filled 2017-04-20 (×4): qty 1

## 2017-04-20 MED ORDER — MAGNESIUM OXIDE 400 (241.3 MG) MG PO TABS
400.0000 mg | ORAL_TABLET | Freq: Two times a day (BID) | ORAL | Status: AC
  Administered 2017-04-20 (×2): 400 mg via ORAL
  Filled 2017-04-20 (×2): qty 1

## 2017-04-20 MED ORDER — ENOXAPARIN SODIUM 40 MG/0.4ML ~~LOC~~ SOLN
40.0000 mg | SUBCUTANEOUS | Status: DC
  Administered 2017-04-20: 40 mg via SUBCUTANEOUS
  Filled 2017-04-20: qty 0.4

## 2017-04-20 MED ORDER — VITAMIN B-12 1000 MCG PO TABS
1000.0000 ug | ORAL_TABLET | Freq: Every day | ORAL | Status: DC
  Administered 2017-04-20 – 2017-04-21 (×2): 1000 ug via ORAL
  Filled 2017-04-20 (×2): qty 1

## 2017-04-20 MED ORDER — FERROUS SULFATE 325 (65 FE) MG PO TABS
325.0000 mg | ORAL_TABLET | Freq: Two times a day (BID) | ORAL | Status: DC
  Administered 2017-04-20 – 2017-04-21 (×3): 325 mg via ORAL
  Filled 2017-04-20 (×3): qty 1

## 2017-04-20 MED ORDER — RANOLAZINE ER 500 MG PO TB12
500.0000 mg | ORAL_TABLET | Freq: Two times a day (BID) | ORAL | Status: DC
  Administered 2017-04-20 – 2017-04-21 (×3): 500 mg via ORAL
  Filled 2017-04-20 (×5): qty 1

## 2017-04-20 MED ORDER — FINASTERIDE 5 MG PO TABS
5.0000 mg | ORAL_TABLET | Freq: Every day | ORAL | Status: DC
  Administered 2017-04-20: 5 mg via ORAL
  Filled 2017-04-20: qty 1

## 2017-04-20 MED ORDER — SODIUM CHLORIDE 0.9% FLUSH
3.0000 mL | INTRAVENOUS | Status: DC | PRN
  Administered 2017-04-21: 3 mL via INTRAVENOUS
  Filled 2017-04-20: qty 3

## 2017-04-20 MED ORDER — MOMETASONE FURO-FORMOTEROL FUM 100-5 MCG/ACT IN AERO
2.0000 | INHALATION_SPRAY | Freq: Two times a day (BID) | RESPIRATORY_TRACT | Status: DC
  Administered 2017-04-20 – 2017-04-21 (×3): 2 via RESPIRATORY_TRACT
  Filled 2017-04-20: qty 8.8

## 2017-04-20 MED ORDER — TAMSULOSIN HCL 0.4 MG PO CAPS
0.4000 mg | ORAL_CAPSULE | Freq: Every day | ORAL | Status: DC
  Administered 2017-04-20: 0.4 mg via ORAL
  Filled 2017-04-20: qty 1

## 2017-04-20 MED ORDER — ACETAMINOPHEN 325 MG PO TABS: 650.0000 mg | ORAL_TABLET | ORAL | Status: DC | PRN

## 2017-04-20 MED ORDER — LORATADINE 10 MG PO TABS
10.0000 mg | ORAL_TABLET | Freq: Every day | ORAL | Status: DC
  Administered 2017-04-20 – 2017-04-21 (×2): 10 mg via ORAL
  Filled 2017-04-20 (×2): qty 1

## 2017-04-20 MED ORDER — SPIRONOLACTONE 25 MG PO TABS
12.5000 mg | ORAL_TABLET | Freq: Every day | ORAL | Status: DC
  Administered 2017-04-20 – 2017-04-21 (×2): 12.5 mg via ORAL
  Filled 2017-04-20 (×2): qty 1

## 2017-04-20 MED ORDER — SODIUM CHLORIDE 0.9 % IV SOLN: 250.0000 mL | INTRAVENOUS | Status: DC | PRN

## 2017-04-20 MED ORDER — MAGNESIUM SULFATE 2 GM/50ML IV SOLN
2.0000 g | Freq: Once | INTRAVENOUS | Status: AC
  Administered 2017-04-20: 2 g via INTRAVENOUS
  Filled 2017-04-20: qty 50

## 2017-04-20 MED ORDER — ADULT MULTIVITAMIN W/MINERALS CH
ORAL_TABLET | ORAL | Status: DC
  Administered 2017-04-20 – 2017-04-21 (×2): 1 via ORAL
  Filled 2017-04-20 (×2): qty 1

## 2017-04-20 MED ORDER — INSULIN ASPART 100 UNIT/ML ~~LOC~~ SOLN
0.0000 [IU] | Freq: Three times a day (TID) | SUBCUTANEOUS | Status: DC
  Administered 2017-04-20: 2 [IU] via SUBCUTANEOUS
  Administered 2017-04-21: 1 [IU] via SUBCUTANEOUS
  Administered 2017-04-21: 2 [IU] via SUBCUTANEOUS
  Filled 2017-04-20 (×2): qty 2
  Filled 2017-04-20: qty 1

## 2017-04-20 MED ORDER — PERFLUTREN LIPID MICROSPHERE
1.0000 mL | INTRAVENOUS | Status: AC | PRN
  Administered 2017-04-20: 2 mL via INTRAVENOUS

## 2017-04-20 MED ORDER — NITROGLYCERIN 0.4 MG SL SUBL: 0.4000 mg | SUBLINGUAL_TABLET | SUBLINGUAL | Status: DC | PRN

## 2017-04-20 MED ORDER — FLUOXETINE HCL 20 MG PO CAPS
20.0000 mg | ORAL_CAPSULE | Freq: Every day | ORAL | Status: DC
  Administered 2017-04-20 – 2017-04-21 (×2): 20 mg via ORAL
  Filled 2017-04-20 (×2): qty 1

## 2017-04-20 MED ORDER — INSULIN GLARGINE 100 UNIT/ML ~~LOC~~ SOLN
56.0000 [IU] | Freq: Every day | SUBCUTANEOUS | Status: DC
  Administered 2017-04-20: 56 [IU] via SUBCUTANEOUS
  Filled 2017-04-20 (×2): qty 0.56

## 2017-04-20 MED ORDER — ATORVASTATIN CALCIUM 20 MG PO TABS
80.0000 mg | ORAL_TABLET | Freq: Every evening | ORAL | Status: DC
  Administered 2017-04-20: 80 mg via ORAL
  Filled 2017-04-20: qty 4

## 2017-04-20 MED ORDER — ORAL CARE MOUTH RINSE: 15.0000 mL | Freq: Two times a day (BID) | OROMUCOSAL | Status: DC

## 2017-04-20 MED ORDER — ONDANSETRON HCL 4 MG/2ML IJ SOLN: 4.0000 mg | Freq: Four times a day (QID) | INTRAMUSCULAR | Status: DC | PRN

## 2017-04-20 MED ORDER — CARVEDILOL 6.25 MG PO TABS
6.2500 mg | ORAL_TABLET | Freq: Two times a day (BID) | ORAL | Status: DC
  Administered 2017-04-20 – 2017-04-21 (×3): 6.25 mg via ORAL
  Filled 2017-04-20 (×3): qty 1

## 2017-04-20 MED ORDER — SODIUM CHLORIDE 0.9% FLUSH
3.0000 mL | Freq: Two times a day (BID) | INTRAVENOUS | Status: DC
  Administered 2017-04-20 – 2017-04-21 (×4): 3 mL via INTRAVENOUS

## 2017-04-20 MED ORDER — POTASSIUM CHLORIDE CRYS ER 20 MEQ PO TBCR
40.0000 meq | EXTENDED_RELEASE_TABLET | Freq: Once | ORAL | Status: AC
  Administered 2017-04-20: 40 meq via ORAL
  Filled 2017-04-20: qty 2

## 2017-04-20 MED ORDER — FUROSEMIDE 10 MG/ML IJ SOLN
40.0000 mg | Freq: Two times a day (BID) | INTRAMUSCULAR | Status: DC
  Administered 2017-04-20 – 2017-04-21 (×3): 40 mg via INTRAVENOUS
  Filled 2017-04-20 (×3): qty 4

## 2017-04-20 MED ORDER — ALBUTEROL SULFATE (2.5 MG/3ML) 0.083% IN NEBU: 3.0000 mL | INHALATION_SOLUTION | Freq: Four times a day (QID) | RESPIRATORY_TRACT | Status: DC | PRN

## 2017-04-20 NOTE — Progress Notes (Signed)
Patient had 40 beats of VTach. Patient was assessed and denies any chest pain, SOB, or discomfort. [place], NP was notified and had order for magnesium and phosphorous lab in the morning.

## 2017-04-20 NOTE — Progress Notes (Signed)
Name: [name] MRN: [id number] DOB: [birth year]    ADMISSION DATE:  04/19/2017  CHIEF COMPLAINT:  Shortness of breath  BRIEF PATIENT DESCRIPTION: 71 year old male with  Acute on Chronic respiratory failure secondary to Acute on Chronic CHFexacerbation.  SIGNIFICANT EVENTS  5/16 Patient admitted to the ICU with shortness of breath secondary to Pulmonary edema/CHF exacerbation  STUDIES:  none   HISTORY OF PRESENT ILLNESS: Lenord Fralix is a 71 year old male with known history of Ischemic Cardiomyopathy,CHF,COPD,DM,GERD,MI( [id number]), HTN and HLD.  Patient presented to [facility] ON 5/16 with shortness of breath.  Patient uses 5l of O2 at night along with his CPAP. Patient presented with increased shortness of breath.  CXR was concerning for mild Pulmonary edema.  Patient was diuresed. Hospitalist team admitted the patient. Patient was sent to ICU for observation overnight. PAST MEDICAL HISTORY :   has a past medical history of Anemia; Cancer ([place]) (12/2013); Cardiogenic pulmonary edema ([place]) (12/19/2014); Cardiomyopathy, ischemic; CHF (congestive heart failure) ([place]); COPD (chronic obstructive pulmonary disease) ([place]); Diabetes mellitus without complication ([place]); GERD (gastroesophageal reflux disease); Hypercholesteremia; Hypertension; Myocardial infarction [facility]) ([id number]); and Shortness of breath dyspnea.  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 Prior to Admission medications   Medication Sig Start Date End Date Taking? Authorizing Provider  acetaminophen (TYLENOL) 325 MG tablet Take 2 tablets (650 mg total) by mouth every 6 (six) hours as needed for mild pain (or Fever >/= 101). 02/02/16  Yes Gouru, Illene Silver, MD  albuterol (PROVENTIL HFA;VENTOLIN HFA) 108 (90 Base)  MCG/ACT inhaler Inhale 2 puffs into the lungs 4 (four) times daily as needed for wheezing or shortness of breath.    Yes [provider]  albuterol-ipratropium (COMBIVENT) 18-103 MCG/ACT inhaler Inhale 1 puff into the lungs 2 times daily at 12 noon and 4 pm.   Yes [provider]  aspirin 81 MG tablet Take 81 mg by mouth every morning.   Yes [provider]  atorvastatin (LIPITOR) 40 MG tablet Take 80 mg by mouth every evening.    Yes [provider]  budesonide-formoterol (SYMBICORT) 80-4.5 MCG/ACT inhaler Inhale 2 puffs into the lungs 2 (two) times daily.   Yes [provider]  calcium carbonate (TUMS - DOSED IN MG ELEMENTAL CALCIUM) 500 MG chewable tablet Chew 1 tablet by mouth as needed for indigestion or heartburn.   Yes [provider]  carvedilol (COREG) 6.25 MG tablet Take 6.25 mg by mouth 2 (two) times daily with a meal.   Yes [provider]  citalopram (CELEXA) 20 MG tablet Take 20 mg by mouth 2 (two) times daily.   Yes [provider]  clopidogrel (PLAVIX) 75 MG tablet Take 75 mg by mouth every morning.   Yes [provider]  ferrous sulfate 325 (65 FE) MG tablet Take 325 mg by mouth 2 (two) times daily with a meal.   Yes [provider]  finasteride (PROSCAR) 5 MG tablet Take 5 mg by mouth at bedtime.   Yes [provider]  FLUoxetine (PROZAC) 20 MG capsule Take 20 mg by mouth daily.   Yes [provider]  furosemide (LASIX) 40 MG tablet Take 40 mg by mouth daily.   Yes [provider]  gabapentin (NEURONTIN) 300 MG capsule Take 600 mg by mouth 3 (three) times daily.    Yes [provider]  insulin glargine (LANTUS) 100 UNIT/ML injection Inject 90 Units into the skin at bedtime.    Yes [provider]  insulin lispro (HUMALOG) 100 UNIT/ML KiwkPen Inject 8-14 Units into the skin 3 (three) times daily with meals. Per sliding scale   Yes [provider]  isosorbide mononitrate (IMDUR) 60 MG 24 hr tablet Take 60 mg by mouth daily.   Yes [provider]  lansoprazole (PREVACID) 15 MG capsule Take 30 mg by mouth 2 (two) times daily.   Yes [provider]  lisinopril (PRINIVIL,ZESTRIL) 20 MG tablet Take 20 mg by mouth daily.    Yes [provider]  loratadine (CLARITIN) 10 MG tablet Take 10 mg by mouth daily.   Yes [provider]  metFORMIN (GLUCOPHAGE) 1000 MG tablet Take 1,000 mg by mouth 2 (two) times daily with a meal.   Yes [provider]  Multiple Vitamins-Minerals (CENTRUM SILVER PO) Take 1 tablet by mouth every morning.   Yes [provider]  nitroGLYCERIN (NITROSTAT) 0.4 MG SL tablet Place 0.4 mg under the tongue every 5 (five) minutes as needed for chest pain.   Yes [provider]  pantoprazole (PROTONIX) 40 MG tablet Take 40 mg by mouth 2 (two) times daily.   Yes [provider]  ranolazine (RANEXA) 500 MG 12 hr tablet Take 500 mg by mouth 2 (two) times daily.   Yes [provider]  spironolactone (ALDACTONE) 25 MG tablet Take 12.5 mg by mouth daily.   Yes [provider]  sucralfate (CARAFATE) 1 g tablet Take 1 g by mouth 2 (two) times daily.   Yes [provider]  tamsulosin (FLOMAX) 0.4 MG CAPS capsule Take 0.4 mg by mouth daily after supper.   Yes [provider]  torsemide (DEMADEX) 20 MG tablet Take 40 mg by mouth daily. May take 1-2 tablets as needed for fluid   Yes [provider]  Ubiquinol 100 MG CAPS Take by mouth. daily    [provider]   Allergies  Allergen Reactions  . Benadryl [Diphenhydramine] Other (See Comments)    " Hyperactivity"  . Doxycycline Swelling    Pt went into pulmonary edema.  . Lopid [Gemfibrozil] Swelling    "I gain 1 pound a day for 30 days."    FAMILY HISTORY:  family history includes Alzheimer's disease in his father; CAD in his mother; Cancer in his father and  mother; Diabetes in his mother; Heart disease in his father. SOCIAL HISTORY:  reports that he quit smoking about 20 years ago. He has a 60.00 pack-year smoking history. He has never used smokeless tobacco. He reports that he does not drink alcohol or use drugs.  REVIEW OF SYSTEMS:   Constitutional: Negative for fever, chills, weight loss, malaise/fatigue and diaphoresis.  HENT: Negative for hearing loss, ear pain, nosebleeds, congestion, sore throat, neck pain, tinnitus and ear discharge.   Eyes: Negative for blurred vision, double vision, photophobia, pain, discharge and redness.  Respiratory: Negative for cough, hemoptysis, sputum production, shortness of breath, wheezing and stridor.   Cardiovascular: Negative for chest pain, palpitations, orthopnea, claudication, leg swelling and PND.  Gastrointestinal: Negative for heartburn, nausea, vomiting, abdominal pain, diarrhea, constipation, blood in stool and melena.  Genitourinary: Negative for dysuria, urgency, frequency, hematuria and flank pain.  Musculoskeletal: Negative for myalgias, back pain, joint pain and falls.  Skin: Negative for itching and rash.  Neurological: Negative for dizziness, tingling, tremors, sensory change, speech change, focal weakness, seizures, loss of consciousness,  weakness and headaches.  Endo/Heme/Allergies: Negative for environmental allergies and polydipsia. Does not bruise/bleed easily.  SUBJECTIVE: Patient states that "he feels better"  VITAL SIGNS: Temp:  [98.3 F (36.8 C)] 98.3 F (36.8 C) (05/15 2314) Pulse Rate:  [76-109] 76 (05/16 0130) Resp:  [22-40] 23 (05/16 0130) BP: (128-157)/(59-66) 157/66 (05/16 0130) SpO2:  [96 %-100 %] 96 % (05/16 0130) Weight:  [254 lb 3.1 oz (115.3 kg)] 254 lb 3.1 oz (115.3 kg) (05/15 2323)  PHYSICAL EXAMINATION: General:  Pleasant elderly gentleman, in no acute distress Neuro:  Awake, Alert , Oriented HEENT: AT,[place],No JVD Cardiovascular:  S1S2, Regular, no  m/r/g Lungs:  Diminished breath sounds, no wheezes,crackles,rhonchi noted Abdomen: soft, distended, +BS Musculoskeletal: Slight pedal edema Skin:  Warm, dry and Intact   Recent Labs Lab 04/19/17 2317  NA 138  K 3.1*  CL 101  CO2 27  BUN 17  CREATININE 1.39*  GLUCOSE 211*    Recent Labs Lab 04/19/17 2317  HGB 11.5*  HCT 32.9*  WBC 9.6  PLT 205   Dg Chest Portable 1 View  Result Date: 04/19/2017 CLINICAL DATA:  Acute onset of shortness of breath tonight. EXAM: PORTABLE CHEST 1 VIEW COMPARISON:  12/15/2016 FINDINGS: Multilead left-sided pacemaker, leads are intact. Post median sternotomy and CABG. Cardiomegaly is stable. Mild increased interstitial opacities suspicious for pulmonary edema. Minimal blunting of both costophrenic angles may be tiny effusions or secondary to hyperinflation. No focal airspace disease. No pneumothorax. There is degenerative change in the spine. IMPRESSION: Mild pulmonary edema. Small pleural effusions versus hyperinflation. Electronically Signed   By: Jeb Levering M.D.   On: 04/19/2017 23:41    ASSESSMENT / PLAN: Acute on chronic respiratory failure secondary to Pulmonary edema Acute On Chronic CHF Hx of COPD Hypokalemia  Former Smoker Hx of HTN and HLD  Continue BiPAP, Wean as tolerated Cardiology consulted F/u ECHO Continue aspirin Continue Atorvastatin Continue Coreg/Imdur/lisinopril/spirinolactone/ranexa  Gentle Diuresis Replace electrolytes per usual guidelines Bronchodilators Follow BMET   Bincy Varughese,AG-ACNP Pulmonary and [place]    04/20/2017, 2:27 AM   Pt seen and examined, agree with NP findings, assessment and plan as amended above. Acute respiratory failure secondary to acute pulm edema. I personally reviewed the CXR which is consistent with pulm edema. Pt currently feels that his breathing is better than on admission. Lung auscultation reveals scattered creps bilaterally. Continue  diuresis; wean down bipap as tolerated.   Marda Stalker, M.D. 04/20/2017

## 2017-04-20 NOTE — Progress Notes (Signed)
Patient transferred to room 249 from the unit, alert and oriented, denies any pain at this time, patient off bipap at this time, remains on oxygen 2 L [place].

## 2017-04-20 NOTE — ED Provider Notes (Signed)
[facility] Emergency Department Provider Note   ____________________________________________   First MD Initiated Contact with Patient 04/19/17 2309     (approximate)  I have reviewed the triage vital signs and the nursing notes.   HISTORY  Chief Complaint Shortness of Breath    HPI [name] is a 71 y.o. male who comes into the hospital today with some shortness of breath. The patient started feeling short of breath at approximately 8 PM. At 9 he took some nitroglycerin and Lasix and then at 10 he placed himself on his home CPAP. He does start to feel better but his family was concerned so they wanted him to come and get checked out in the hospital. The patient according to EMS had some rales in his lungs. The patient usually uses 5 L of oxygen at night but nothing during the day. EMS reports that his blood pressure was 162/84 when the fire department initially arrived. He does feel improved on CPAP. He has some dyspnea on exertion and reports that his abdomen has been getting more swollen in the last couple of days.The patient denies any swelling in his legs or any chest pain. He is also had no abdominal pain, nausea, vomiting, dizziness or lightheadedness. He denies any recent illness. He is here tonight for evaluation of his shortness of breath.   Past Medical History:  Diagnosis Date  . Anemia   . Cancer ([place]) 12/2013   prostate  . Cardiogenic pulmonary edema ([place]) 12/19/2014  . Cardiomyopathy, ischemic   . CHF (congestive heart failure) ([place])   . COPD (chronic obstructive pulmonary disease) ([place])   . Diabetes mellitus without complication ([place])   . GERD (gastroesophageal reflux disease)   . Hypercholesteremia   . Hypertension   . Myocardial infarction ([place]) [id number]  . Shortness of breath dyspnea     Patient Active Problem List   Diagnosis Date Noted  . HTN (hypertension) 02/13/2016  . COPD with chronic bronchitis ([place])  02/13/2016  . Obstructive sleep apnea 02/13/2016  . Diabetes ([place]) 02/13/2016  . Chronic systolic HF (heart failure) ([place]) 12/12/2015    Past Surgical History:  Procedure Laterality Date  . CARDIAC CATHETERIZATION N/A 02/02/2016   Procedure: Left Heart Cath and Coronary Angiography;  Surgeon: [name], MD;  Location: [place] CV LAB;  Service: Cardiovascular;  Laterality: N/A;  . CORONARY ANGIOPLASTY WITH STENT PLACEMENT    . CORONARY ARTERY BYPASS GRAFT  11/24/2010  . IMPLANTABLE CARDIOVERTER DEFIBRILLATOR (ICD) GENERATOR CHANGE Left 12/12/2015   Procedure: DUAL LEAD PLACEMENT CARDIAC DIFIBRILLATOR;  Surgeon: [name], MD;  Location: [facility] ORS;  Service: Cardiovascular;  Laterality: Left;  . TONSILLECTOMY      Prior to Admission medications   Medication Sig Start Date End Date Taking? Authorizing Provider  acetaminophen (TYLENOL) 325 MG tablet Take 2 tablets (650 mg total) by mouth every 6 (six) hours as needed for mild pain (or Fever >/= 101). 02/02/16   Gouru, Illene Silver, MD  albuterol (PROVENTIL HFA;VENTOLIN HFA) 108 (90 Base) MCG/ACT inhaler Inhale 2 puffs into the lungs 4 (four) times daily as needed for wheezing or shortness of breath.     [provider]  albuterol-ipratropium (COMBIVENT) 18-103 MCG/ACT inhaler Inhale 1 puff into the lungs 2 times daily at 12 noon and 4 pm.    [provider]  aspirin 81 MG tablet Take 81 mg by mouth every morning.    [provider]  atorvastatin (LIPITOR) 40 MG tablet Take  80 mg by mouth every evening.     [provider]  budesonide-formoterol (SYMBICORT) 80-4.5 MCG/ACT inhaler Inhale 2 puffs into the lungs 2 (two) times daily.    [provider]  calcium carbonate (TUMS - DOSED IN MG ELEMENTAL CALCIUM) 500 MG chewable tablet Chew 1 tablet by mouth as needed for indigestion or heartburn.    [provider]  carvedilol (COREG) 6.25 MG tablet Take 6.25 mg by mouth 2 (two) times daily  with a meal.    [provider]  clopidogrel (PLAVIX) 75 MG tablet Take 75 mg by mouth every morning.    [provider]  cyanocobalamin 1000 MCG tablet Take 1,000 mcg by mouth daily.    [provider]  ferrous sulfate 325 (65 FE) MG tablet Take 325 mg by mouth 2 (two) times daily with a meal.    [provider]  finasteride (PROSCAR) 5 MG tablet Take 5 mg by mouth at bedtime.    [provider]  FLUoxetine (PROZAC) 20 MG capsule Take 20 mg by mouth daily.    [provider]  gabapentin (NEURONTIN) 300 MG capsule Take 300 mg by mouth 2 (two) times daily.     [provider]  insulin glargine (LANTUS) 100 UNIT/ML injection Inject 60 Units into the skin at bedtime.    [provider]  insulin lispro (HUMALOG) 100 UNIT/ML KiwkPen Inject 8-14 Units into the skin 3 (three) times daily with meals. Per sliding scale    [provider]  isosorbide mononitrate (IMDUR) 60 MG 24 hr tablet Take 60 mg by mouth daily.    [provider]  lansoprazole (PREVACID) 15 MG capsule Take 30 mg by mouth 2 (two) times daily.    [provider]  lisinopril (PRINIVIL,ZESTRIL) 20 MG tablet Take 20 mg by mouth daily.     [provider]  loratadine (CLARITIN) 10 MG tablet Take 10 mg by mouth daily.    [provider]  Multiple Vitamins-Minerals (CENTRUM SILVER PO) Take 1 tablet by mouth every morning.    [provider]  nitroGLYCERIN (NITROSTAT) 0.4 MG SL tablet Place 0.4 mg under the tongue every 5 (five) minutes as needed for chest pain.    [provider]  ranitidine (ZANTAC) 150 MG tablet Take 150 mg by mouth daily as needed for heartburn.    [provider]  ranolazine (RANEXA) 500 MG 12 hr tablet Take 500 mg by mouth 2 (two) times daily.    [provider]  spironolactone (ALDACTONE) 25 MG tablet Take 12.5 mg by mouth daily.    [provider]  tamsulosin  (FLOMAX) 0.4 MG CAPS capsule Take 0.4 mg by mouth daily after supper.    [provider]  torsemide (DEMADEX) 20 MG tablet Take 40 mg by mouth daily. May take 1-2 tablets as needed for fluid    [provider]  Ubiquinol 100 MG CAPS Take by mouth. daily    [provider]    Allergies Benadryl [diphenhydramine]; Doxycycline; and Lopid [gemfibrozil]  Family History  Problem Relation Age of Onset  . CAD Mother   . Cancer Mother   . Diabetes Mother   . Alzheimer's disease Father   . Cancer Father   . Heart disease Father     Social History Social History  Substance Use Topics  . Smoking status: Former Smoker    Packs/day: 2.00    Years: 30.00    Quit date: 12/03/1996  . Smokeless  tobacco: Never Used  . Alcohol use No    Review of Systems Constitutional: No fever/chills Eyes: No visual changes. ENT: No sore throat. Cardiovascular: Denies chest pain. Respiratory:  shortness of breath. Gastrointestinal: No abdominal pain.  No nausea, no vomiting.  No diarrhea.  No constipation. Genitourinary: Negative for dysuria. Musculoskeletal: Negative for back pain. Skin: Negative for rash. Neurological: Negative for headaches, focal weakness or numbness.   ____________________________________________   PHYSICAL EXAM:  VITAL SIGNS: ED Triage Vitals  Enc Vitals Group     BP --      Pulse Rate 04/19/17 2314 (!) 109     Resp 04/19/17 2314 (!) 40     Temp 04/19/17 2314 98.3 F (36.8 C)     Temp Source 04/19/17 2314 Oral     SpO2 04/19/17 2314 100 %     Weight 04/19/17 2323 254 lb 3.1 oz (115.3 kg)     Height 04/19/17 2323 5\' 8"  (1.727 m)     Head Circumference --      Peak Flow --      Pain Score --      Pain Loc --      Pain Edu? --      Excl. in [place]? --     Constitutional: Alert and oriented. Well appearing and in moderate to severe respiratory distress. Eyes: Conjunctivae are normal. PERRL. EOMI. Head: Atraumatic. Nose: No  congestion/rhinnorhea. Mouth/Throat: Mucous membranes are moist.  Oropharynx non-erythematous. Cardiovascular: Tachycardia, regular rhythm. Grossly normal heart sounds.  Good peripheral circulation. Respiratory: Increased respiratory effort.  Subcostal retractions. Rales and wheezing auscultated throughout all lung fields Gastrointestinal: Soft and nontender. distention. Positive bowel sounds Musculoskeletal: No lower extremity tenderness nor edema.   Neurologic:  Normal speech and language.  Skin:  Skin is warm, dry and intact.  Psychiatric: Mood and affect are normal.   ____________________________________________   LABS (all labs ordered are listed, but only abnormal results are displayed)  Labs Reviewed  CBC - Abnormal; Notable for the following:       Result Value   RBC 3.74 (*)    Hemoglobin 11.5 (*)    HCT 32.9 (*)    RDW 14.9 (*)    All other components within normal limits  BASIC METABOLIC PANEL - Abnormal; Notable for the following:    Potassium 3.1 (*)    Glucose, Bld 211 (*)    Creatinine, Ser 1.39 (*)    Calcium 8.2 (*)    GFR calc non Af Amer 49 (*)    GFR calc Af Amer 57 (*)    All other components within normal limits  BRAIN NATRIURETIC PEPTIDE - Abnormal; Notable for the following:    B Natriuretic Peptide 450.0 (*)    All other components within normal limits  TROPONIN I   ____________________________________________  EKG  ED ECG REPORT I, Loney Hering, the attending physician, personally viewed and interpreted this ECG.   Date: 04/20/2017  EKG Time: 2318  Rate: 99  Rhythm: ventricular paced rhythm  Axis: normal axis  Intervals:none  ST&T Change: none  ____________________________________________  RADIOLOGY  CXR ____________________________________________   PROCEDURES  Procedure(s) performed: None  Procedures  Critical Care performed: Yes, see critical care note(s)  CRITICAL CARE Performed by: Charlesetta Ivory P   Total  critical care time: 30 minutes  Critical care time was exclusive of separately billable procedures and treating other patients.  Critical care was necessary to treat or prevent imminent or life-threatening deterioration.  Critical  care was time spent personally by me on the following activities: development of treatment plan with patient and/or surrogate as well as nursing, discussions with consultants, evaluation of patient's response to treatment, examination of patient, obtaining history from patient or surrogate, ordering and performing treatments and interventions, ordering and review of laboratory studies, ordering and review of radiographic studies, pulse oximetry and re-evaluation of patient's condition. care ____________________________________________   INITIAL IMPRESSION / ASSESSMENT AND PLAN / ED COURSE  Pertinent labs & imaging results that were available during my care of the patient were reviewed by me and considered in my medical decision making (see chart for details).  This is a 71 year old male who comes into the hospital today with some shortness of breath. The patient has some significant Rales respiratory distress. When he arrived we did place him on BiPAP. His blood pressure was in the 161W systolic so I did place a Nitropaste onto his chest. I also gave the patient a dose of Lasix. I will await the results of the patient's blood work and I will reassess the patient.  Clinical Course as of Apr 20 17  Tue Apr 19, 2017  2353 Mild pulmonary edema. Small pleural effusions versus hyperinflation. DG Chest Portable 1 View [AW]    Clinical Course User Index [AW] Loney Hering, MD   The patient does still have some tachypnea but he does feel improved and his lung sounds are improved. The patient is still currently on BiPAP but his heart rate is improved into the 80s. I will admit the patient to the hospitalist  service.  ____________________________________________   FINAL CLINICAL IMPRESSION(S) / ED DIAGNOSES  Final diagnoses:  Acute on chronic congestive heart failure, unspecified heart failure type ([place])      NEW MEDICATIONS STARTED DURING THIS VISIT:  New Prescriptions   No medications on file     Note:  This document was prepared using Dragon voice recognition software and may include unintentional dictation errors.    Loney Hering, MD 04/20/17 [phone]

## 2017-04-20 NOTE — ED Notes (Signed)
Report called to Pamala Hurry, RN, CCU

## 2017-04-20 NOTE — Progress Notes (Addendum)
[facility] NAME: [name]    MR#:  [id number]  DATE OF BIRTH:  Dec 18, 1945  SUBJECTIVE:  CHIEF COMPLAINT:   Chief Complaint  Patient presents with  . Shortness of Breath  The patient is 71 year old male with past medical history significant for history of ischemic cardiomyopathy, CHF, COPD, diabetes, hypertension, hyperlipidemia, who presented to the hospital with shortness of breath for the last 1 day. On arrival to the hospital, he was noted to have fluid overload, he was given intravenous Lasix, decreased approximately 600 cc and clinically improved, remains on 5 L of oxygen nasal cannula. Denies any chest pains  Review of Systems  Constitutional: Negative for chills, fever and weight loss.  HENT: Negative for congestion.   Eyes: Negative for blurred vision and double vision.  Respiratory: Positive for shortness of breath. Negative for cough, sputum production and wheezing.   Cardiovascular: Negative for chest pain, palpitations, orthopnea, leg swelling and PND.  Gastrointestinal: Negative for abdominal pain, blood in stool, constipation, diarrhea, nausea and vomiting.  Genitourinary: Negative for dysuria, frequency, hematuria and urgency.  Musculoskeletal: Negative for falls.  Neurological: Negative for dizziness, tremors, focal weakness and headaches.  Endo/Heme/Allergies: Does not bruise/bleed easily.  Psychiatric/Behavioral: Negative for depression. The patient does not have insomnia.     VITAL SIGNS: Blood pressure (!) 114/56, pulse 73, temperature 98.1 F (36.7 C), temperature source Oral, resp. rate 18, height 5\' 8"  (1.727 m), weight 111.6 kg (246 lb 0.5 oz), SpO2 94 %.  PHYSICAL EXAMINATION:   GENERAL:  71 y.o.-year-old patient lying in the bed with no acute distress.  EYES: Pupils equal, round, reactive to light and accommodation. No scleral icterus. Extraocular muscles intact.  HEENT: Head  atraumatic, normocephalic. Oropharynx and nasopharynx clear.  NECK:  Supple, no jugular venous distention. No thyroid enlargement, no tenderness.  LUNGS: Normal breath sounds bilaterally, no wheezing, rales,rhonchi bilateral scattered crepitation in all lung fields .  intermittent use ofry muscles of respiration, Especially with movements and speech.  CARDIOVASCULAR: S1,S2 . Rhythm was regular . No murmurs, rubs, or gallops.  ABDOMEN: Soft, nontender, nondistended. Bowel sounds present. No organomegaly or mass.  EXTREMITIES: No pedal edema, cyanosis, or clubbing.  NEUROLOGIC: Cranial nerves II through XII are intact. Muscle strength 5/5 in all extremities. Sensation intact. Gait not checked.  PSYCHIATRIC: The patient is alert and oriented x 3.  SKIN: No obvious rash, lesion, or ulcer.   ORDERS/RESULTS REVIEWED:   CBC  Recent Labs Lab 04/19/17 2317 04/20/17 0307  WBC 9.6 7.8  HGB 11.5* 10.7*  HCT 32.9* 30.3*  PLT 205 173  MCV 87.9 88.6  MCH 30.7 31.2  MCHC 34.9 35.2  RDW 14.9* 14.8*   ------------------------------------------------------------------------------------------------------------------  Chemistries   Recent Labs Lab 04/19/17 2317 04/20/17 0307  NA 138  --   K 3.1*  --   CL 101  --   CO2 27  --   GLUCOSE 211*  --   BUN 17  --   CREATININE 1.39* 1.13  CALCIUM 8.2*  --   MG  --  1.2*   ------------------------------------------------------------------------------------------------------------------ estimated creatinine clearance is 71.6 mL/min (by C-G formula based on SCr of 1.13 mg/dL). ------------------------------------------------------------------------------------------------------------------ No results for input(s): TSH, T4TOTAL, T3FREE, THYROIDAB in the last 72 hours.  Invalid input(s): FREET3  Cardiac Enzymes  Recent Labs Lab 04/19/17 2317  TROPONINI <0.03    ------------------------------------------------------------------------------------------------------------------ Invalid input(s): POCBNP ---------------------------------------------------------------------------------------------------------------  RADIOLOGY: Dg Chest Portable 1 View  Result  Date: 04/19/2017 CLINICAL DATA:  Acute onset of shortness of breath tonight. EXAM: PORTABLE CHEST 1 VIEW COMPARISON:  12/15/2016 FINDINGS: Multilead left-sided pacemaker, leads are intact. Post median sternotomy and CABG. Cardiomegaly is stable. Mild increased interstitial opacities suspicious for pulmonary edema. Minimal blunting of both costophrenic angles may be tiny effusions or secondary to hyperinflation. No focal airspace disease. No pneumothorax. There is degenerative change in the spine. IMPRESSION: Mild pulmonary edema. Small pleural effusions versus hyperinflation. Electronically Signed   By: Jeb Levering M.D.   On: 04/19/2017 23:41    EKG:  Orders placed or performed during the hospital encounter of 04/19/17  . ED EKG  . ED EKG  . EKG 12-Lead  . EKG 12-Lead    ASSESSMENT AND PLAN:  Active Problems:   CHF (congestive heart failure) ([place])  #1. Acute on chronic systolic CHF, continue diuretic, following urinary output, continue oxygen, wean off oxygen as tolerated, get echocardiogram, appreciate cardiology's input   #2. Acute on chronic respiratory failure with hypoxia, wean off oxygen as tolerated, now on 2 L of oxygen through nasal cannula, but is on CPAP and 5 L at night at home, Discussed with Dr. Ashby Dawes #3. Coronary artery disease, no chest pains, cardiac enzymes are negative, continue outpatient medications #4. Hypokalemia, follow with therapy, recheck in the morning, supplement magnesium as well, which is low #5. Essential hypertension, well controlled, continue outpatient medications  #6. Hyperlipidemia, continue Lipitor    Management plans discussed with the  patient, family and they are in agreement.   DRUG ALLERGIES:  Allergies  Allergen Reactions  . Benadryl [Diphenhydramine] Other (See Comments)    " Hyperactivity"  . Doxycycline Swelling    Pt went into pulmonary edema.  . Lopid [Gemfibrozil] Swelling    "I gain 1 pound a day for 30 days."    CODE STATUS:     Code Status Orders        Start     Ordered   04/20/17 0232  Full code  Continuous     04/20/17 0232    Code Status History    Date Active Date Inactive Code Status Order ID Comments User Context   01/31/2016  1:43 AM 02/02/2016  9:11 PM Full Code [id number]  Hillary Bow, MD ED   12/12/2015  4:27 PM 12/13/2015  3:29 PM Full Code [id number]  [name], MD Inpatient    Advance Directive Documentation     Most Recent Value  Type of Advance Directive  Healthcare Power of [place], Living will  Pre-existing out of facility DNR order (yellow form or pink MOST form)  -  "MOST" Form in Place?  -      TOTAL TIME TAKING CARE OF THIS PATIENT: 40 minutes.    Theodoro [name] M.D on 04/20/2017 at 3:38 PM  Between 7am to 6pm - Pager - [phone]  After 6pm go to [email or web address] - password [email or web address] [facility]  Office  [phone]  CC: Primary care physician; [name], MD

## 2017-04-20 NOTE — Progress Notes (Signed)
Report given to [facility] on 2A, handoff completed. Patient being transferred from ICU 13 to 2A room 249.

## 2017-04-20 NOTE — Care Management Note (Signed)
Case Management Note  Patient Details  Name: [name] MRN: [id number] Date of Birth: 12/24/1945  Subjective/Objective:                   Spoke with patient and his wife regarding discharge planning. During the assessment I learned that patient is affiliated with [facility] He receives his nocturnal O2 through them/[facility] and he does not have a portable O2 tank per wife/patient. He denies need for home health services. He denies need for ambulatory aid. He states his civilian MD is Dr. Netty Starring with [facility]. He denies problems paying for his medications. Action/Plan:    Referral to [facility] has been placed on discharge notes. If cardiologist recommends outpatient cardiac rehab- please place that order in [facility]. RNCM will provide patient option to transfer to [facility] however patient requests to stay at [facility] for treatment. RNCM to follow for O2. I have notified [facility] of patient admit.   Expected Discharge Date:                  Expected Discharge Plan:     In-House Referral:     Discharge planning Services  CM Consult  Post Acute Care Choice:    Choice offered to:  Patient, Spouse  DME Arranged:    DME Agency:     HH Arranged:    [place] Agency:     Status of Service:  In process, will continue to follow  If discussed at Long Length of Stay Meetings, dates discussed:    Additional Comments:  Marshell Garfinkel, RN 04/20/2017, 11:45 AM

## 2017-04-20 NOTE — ED Notes (Signed)
669ml urine emptied from urinal; pt uprite on stretcher with no distress noted; wife at bedside; pt denies c/o at present

## 2017-04-20 NOTE — Progress Notes (Signed)
CCMD called and stated that patient had a 20 beat Run of V-tach- NP and MD notified. Patient checked, in no acute distress, denies any pain, chest pain or discomfort. Vitals stable.

## 2017-04-20 NOTE — Consult Note (Signed)
[facility] Consultation Note  Patient ID: [name], MRN: [id number], DOB/AGE: July 30, 1946 71 y.o. Admit date: 04/19/2017   Date of Consult: 04/20/2017 Primary Physician: [name], MD Primary Cardiologist: Nehemiah Massed  Chief Complaint:  Chief Complaint  Patient presents with  . Shortness of Breath   Reason for Consult: acute on chronic systolic dysfunction heart failure  HPI: 71 y.o. male with known coronary artery disease status post coronary artery bypass graft and previous myocardial infarction with the cardiomyopathy and chronic systolic dysfunction heart failure. Patient has been on appropriate medication management with the clear minimal adjustments over the last several 6 months. He has had multiple episodes of acute on chronic systolic dysfunction heart failure for which she had pulmonary edema and hypoxia and was needed to be hospitalized. The patient has not had any myocardial infarction undergo stressful conditions and currently has a normal troponin. The patient has had multiple reasons for his acute on chronic heart failure including a dietary indiscretion anemia and a kidney dysfunction. All of which may have had a role in his current exacerbation. The patient has recovered very quickly from this incident and now is feeling much better after BiPAP. Patient also has sleep apnea for which she is on the CPAP machine at night and appears to be working fairly well. Blood pressure and heart rate have been reasonably well controlled on current medical regimen.  Past Medical History:  Diagnosis Date  . Anemia   . Cancer ([place]) 12/2013   prostate  . Cardiogenic pulmonary edema ([place]) 12/19/2014  . Cardiomyopathy, ischemic   . CHF (congestive heart failure) ([place])   . COPD (chronic obstructive pulmonary disease) ([place])   . Diabetes mellitus without complication ([place])   . GERD (gastroesophageal reflux disease)   . Hypercholesteremia   . Hypertension   .  Myocardial infarction ([place]) [id number]  . Shortness of breath dyspnea       Surgical History:  Past Surgical History:  Procedure Laterality Date  . CARDIAC CATHETERIZATION N/A 02/02/2016   Procedure: Left Heart Cath and Coronary Angiography;  Surgeon: [name], MD;  Location: [facility];  Service: Cardiovascular;  Laterality: N/A;  . CORONARY ANGIOPLASTY WITH STENT PLACEMENT    . CORONARY ARTERY BYPASS GRAFT  11/24/2010  . IMPLANTABLE CARDIOVERTER DEFIBRILLATOR (ICD) GENERATOR CHANGE Left 12/12/2015   Procedure: DUAL LEAD PLACEMENT CARDIAC DIFIBRILLATOR;  Surgeon: [name], MD;  Location: [facility] ORS;  Service: Cardiovascular;  Laterality: Left;  . TONSILLECTOMY       Home Meds: Prior to Admission medications   Medication Sig Start Date End Date Taking? Authorizing Provider  acetaminophen (TYLENOL) 325 MG tablet Take 2 tablets (650 mg total) by mouth every 6 (six) hours as needed for mild pain (or Fever >/= 101). 02/02/16  Yes Gouru, Illene Silver, MD  albuterol (PROVENTIL HFA;VENTOLIN HFA) 108 (90 Base) MCG/ACT inhaler Inhale 2 puffs into the lungs 4 (four) times daily as needed for wheezing or shortness of breath.    Yes [provider]  albuterol-ipratropium (COMBIVENT) 18-103 MCG/ACT inhaler Inhale 1 puff into the lungs 2 times daily at 12 noon and 4 pm.   Yes [provider]  aspirin 81 MG tablet Take 81 mg by mouth every morning.   Yes [provider]  atorvastatin (LIPITOR) 40 MG tablet Take 80 mg by mouth every evening.    Yes [provider]  budesonide-formoterol (SYMBICORT) 80-4.5 MCG/ACT inhaler Inhale 2 puffs into the lungs 2 (two) times daily.  Yes [provider]  calcium carbonate (TUMS - DOSED IN MG ELEMENTAL CALCIUM) 500 MG chewable tablet Chew 1 tablet by mouth as needed for indigestion or heartburn.   Yes [provider]  carvedilol (COREG) 6.25 MG tablet Take 6.25 mg by mouth 2 (two) times daily with a  meal.   Yes [provider]  citalopram (CELEXA) 20 MG tablet Take 20 mg by mouth 2 (two) times daily.   Yes [provider]  clopidogrel (PLAVIX) 75 MG tablet Take 75 mg by mouth every morning.   Yes [provider]  ferrous sulfate 325 (65 FE) MG tablet Take 325 mg by mouth 2 (two) times daily with a meal.   Yes [provider]  finasteride (PROSCAR) 5 MG tablet Take 5 mg by mouth at bedtime.   Yes [provider]  FLUoxetine (PROZAC) 20 MG capsule Take 20 mg by mouth daily.   Yes [provider]  furosemide (LASIX) 40 MG tablet Take 40 mg by mouth daily.   Yes [provider]  gabapentin (NEURONTIN) 300 MG capsule Take 600 mg by mouth 3 (three) times daily.    Yes [provider]  insulin glargine (LANTUS) 100 UNIT/ML injection Inject 90 Units into the skin at bedtime.    Yes [provider]  insulin lispro (HUMALOG) 100 UNIT/ML KiwkPen Inject 8-14 Units into the skin 3 (three) times daily with meals. Per sliding scale   Yes [provider]  isosorbide mononitrate (IMDUR) 60 MG 24 hr tablet Take 60 mg by mouth daily.   Yes [provider]  lansoprazole (PREVACID) 15 MG capsule Take 30 mg by mouth 2 (two) times daily.   Yes [provider]  lisinopril (PRINIVIL,ZESTRIL) 20 MG tablet Take 20 mg by mouth daily.    Yes [provider]  loratadine (CLARITIN) 10 MG tablet Take 10 mg by mouth daily.   Yes [provider]  metFORMIN (GLUCOPHAGE) 1000 MG tablet Take 1,000 mg by mouth 2 (two) times daily with a meal.   Yes [provider]  Multiple Vitamins-Minerals (CENTRUM SILVER PO) Take 1 tablet by mouth every morning.   Yes [provider]  nitroGLYCERIN (NITROSTAT) 0.4 MG SL tablet Place 0.4 mg under the tongue every 5 (five) minutes as needed for chest pain.   Yes [provider]  pantoprazole (PROTONIX) 40 MG tablet Take 40 mg by mouth 2 (two)  times daily.   Yes [provider]  ranolazine (RANEXA) 500 MG 12 hr tablet Take 500 mg by mouth 2 (two) times daily.   Yes [provider]  spironolactone (ALDACTONE) 25 MG tablet Take 12.5 mg by mouth daily.   Yes [provider]  sucralfate (CARAFATE) 1 g tablet Take 1 g by mouth 2 (two) times daily.   Yes [provider]  tamsulosin (FLOMAX) 0.4 MG CAPS capsule Take 0.4 mg by mouth daily after supper.   Yes [provider]  torsemide (DEMADEX) 20 MG tablet Take 40 mg by mouth daily. May take 1-2 tablets as needed for fluid   Yes [provider]  Ubiquinol 100 MG CAPS Take by mouth. daily    [provider]    Inpatient Medications:  . aspirin  81 mg Oral BH-q7a  . atorvastatin  80 mg Oral QPM  . carvedilol  6.25 mg Oral BID WC  . chlorhexidine  15 mL Mouth Rinse BID  . clopidogrel  75 mg Oral BH-q7a  . enoxaparin (LOVENOX)  injection  40 mg Subcutaneous Q24H  . famotidine  20 mg Oral Daily  . ferrous sulfate  325 mg Oral BID WC  . finasteride  5 mg Oral QHS  . FLUoxetine  20 mg Oral Daily  . furosemide  40 mg Intravenous Q12H  . gabapentin  300 mg Oral BID  . isosorbide mononitrate  60 mg Oral Daily  . lisinopril  20 mg Oral Daily  . loratadine  10 mg Oral Daily  . mouth rinse  15 mL Mouth Rinse q12n4p  . mometasone-formoterol  2 puff Inhalation BID  . multivitamin with minerals   Oral BH-q7a  . ranolazine  500 mg Oral BID  . sodium chloride flush  3 mL Intravenous Q12H  . spironolactone  12.5 mg Oral Daily  . tamsulosin  0.4 mg Oral QPC supper  . cyanocobalamin  1,000 mcg Oral Daily   . sodium chloride      Allergies:  Allergies  Allergen Reactions  . Benadryl [Diphenhydramine] Other (See Comments)    " Hyperactivity"  . Doxycycline Swelling    Pt went into pulmonary edema.  . Lopid [Gemfibrozil] Swelling    "I gain 1 pound a day for 30 days."    Social History   Social History  . Marital status:  Married    Spouse name: N/A  . Number of children: N/A  . Years of education: N/A   Occupational History  . retired    Social History Main Topics  . Smoking status: Former Smoker    Packs/day: 2.00    Years: 30.00    Quit date: 12/03/1996  . Smokeless tobacco: Never Used  . Alcohol use No  . Drug use: No  . Sexual activity: Not on file   Other Topics Concern  . Not on file   Social History Narrative  . No narrative on file     Family History  Problem Relation Age of Onset  . CAD Mother   . Cancer Mother   . Diabetes Mother   . Alzheimer's disease Father   . Cancer Father   . Heart disease Father      Review of Systems Positive for Shortness of breath heart failure Negative for: General:  chills, fever, night sweats or weight changes.  Cardiovascular: PND orthopnea syncope dizziness  Dermatological skin lesions rashes Respiratory: Cough congestion Urologic: Frequent urination urination at night and hematuria Abdominal: negative for nausea, vomiting, diarrhea, bright red blood per rectum, melena, or hematemesis Neurologic: negative for visual changes, and/or hearing changes  All other systems reviewed and are otherwise negative except as noted above.  Labs:  Recent Labs  04/19/17 2317  TROPONINI <0.03   Lab Results  Component Value Date   WBC 7.8 04/20/2017   HGB 10.7 (L) 04/20/2017   HCT 30.3 (L) 04/20/2017   MCV 88.6 04/20/2017   PLT 173 04/20/2017    Recent Labs Lab 04/19/17 2317 04/20/17 0307  NA 138  --   K 3.1*  --   CL 101  --   CO2 27  --   BUN 17  --   CREATININE 1.39* 1.13  CALCIUM 8.2*  --   GLUCOSE 211*  --    Lab Results  Component Value Date   CHOL 140 01/05/2014   HDL 33 (L) 01/05/2014   LDLCALC 86 01/05/2014   TRIG 103 01/05/2014   No results found for: DDIMER  Radiology/Studies:  Dg Chest Portable 1 View  Result Date: 04/19/2017 CLINICAL DATA:  Acute onset of shortness of breath tonight. EXAM: PORTABLE CHEST 1 VIEW  COMPARISON:  12/15/2016 FINDINGS: Multilead left-sided pacemaker, leads are intact. Post median sternotomy and CABG. Cardiomegaly is stable. Mild increased interstitial opacities suspicious for pulmonary edema. Minimal blunting of both costophrenic angles may be tiny effusions or secondary to hyperinflation. No focal airspace disease. No pneumothorax. There is degenerative change in the spine. IMPRESSION: Mild pulmonary edema. Small pleural effusions versus hyperinflation. Electronically Signed   By: Jeb Levering M.D.   On: 04/19/2017 23:41    EKG: Normal sinus rhythm  Weights: Filed Weights   04/19/17 2323 04/20/17 0240  Weight: 115.3 kg (254 lb 3.1 oz) 111.6 kg (246 lb 0.5 oz)     Physical Exam: Blood pressure (!) 112/57, pulse 69, temperature 97.8 F (36.6 C), temperature source Axillary, resp. rate 18, height 5\' 8"  (1.727 m), weight 111.6 kg (246 lb 0.5 oz), SpO2 94 %. Body mass index is 37.41 kg/m. General: Well developed, well nourished, in no acute distress. Head eyes ears nose throat: Normocephalic, atraumatic, sclera non-icteric, no xanthomas, nares are without discharge. No apparent thyromegaly and/or mass  Lungs: Normal respiratory effort. Few wheezes, no rales, no rhonchi.  Heart: RRR with normal S1 S2. no murmur gallop, no rub, PMI is normal size and placement, carotid upstroke normal without bruit, jugular venous pressure is normal Abdomen: Soft, non-tender,  -distended with normoactive bowel sounds. No hepatomegaly. No rebound/guarding. No obvious abdominal masses. Abdominal aorta is normal size without bruit Extremities: Trace edema. no cyanosis, no clubbing, no ulcers  Peripheral : 2+ bilateral upper extremity pulses, 2+ bilateral femoral pulses, 2+ bilateral dorsal pedal pulse Neuro: Alert and oriented. No facial asymmetry. No focal deficit. Moves all extremities spontaneously. Musculoskeletal: Normal muscle tone without kyphosis Psych:  Responds to questions  appropriately with a normal affect.    Assessment: 71 year old male with acute on chronic systolic dysfunction congestive heart failure multifactorial in nature including chronic kidney disease and anemia and dietary indiscretion with hypoxia and pulmonary edema now significantly resolved with appropriate medication management and therapy on previous appropriate medication as an outpatient without evidence of myocardial infarction  Plan: 1. Continue BiPAP and oxygenation until hypoxia resolved 2. Begin ambulation and follow for improvements of symptoms and need for further investigation of primary cause including dietary indiscretion anemia and kidney disease 3. No further cardiac diagnostics necessary at this time 4. No change in current medical regimen for coronary artery disease hypertension and hyperlipidemia and other risk factors cardiovascular disease 5. Okay for discharge to home if ambulating well with further adjustments of medication management as an outpatient  Signed, [name] M.D. [facility] 04/20/2017, 8:42 AM

## 2017-04-20 NOTE — H&P (Signed)
[facility] NAME: [name]    MR#:  [id number]  DATE OF BIRTH:  August 15, 1946  DATE OF ADMISSION:  04/19/2017  PRIMARY CARE PHYSICIAN: [name], MD   REQUESTING/REFERRING PHYSICIAN:   CHIEF COMPLAINT:   Chief Complaint  Patient presents with  . Shortness of Breath    HISTORY OF PRESENT ILLNESS: [name]  is a 71 y.o. male with a known history of Ischemic cardiomyopathy, congestive heart failure, COPD, diabetes mellitus, GERD, hyperlipidemia presented to the emergency room with shortness of breath for the last 1 day. Patient uses CPAP at bedtime at home. He felt short of breath since yesterday. He has orthopnea. Patient was evaluated in the emergency room with chest x-ray which showed fluid overload. He was given IV Lasix for diuresis. Patient was put on BiPAP and stabilized in the emergency room when he presented shortness of breath. No complaints of any chest pain, palpitations. No fever chills and cough.  PAST MEDICAL HISTORY:   Past Medical History:  Diagnosis Date  . Anemia   . Cancer ([place]) 12/2013   prostate  . Cardiogenic pulmonary edema ([place]) 12/19/2014  . Cardiomyopathy, ischemic   . CHF (congestive heart failure) ([place])   . COPD (chronic obstructive pulmonary disease) ([place])   . Diabetes mellitus without complication ([place])   . GERD (gastroesophageal reflux disease)   . Hypercholesteremia   . Hypertension   . Myocardial infarction ([place]) [id number]  . Shortness of breath dyspnea     PAST SURGICAL HISTORY: Past Surgical History:  Procedure Laterality Date  . CARDIAC CATHETERIZATION N/A 02/02/2016   Procedure: Left Heart Cath and Coronary Angiography;  Surgeon: [name], MD;  Location: [facility];  Service: Cardiovascular;  Laterality: N/A;  . CORONARY ANGIOPLASTY WITH STENT PLACEMENT    . CORONARY ARTERY BYPASS GRAFT  11/24/2010  . IMPLANTABLE CARDIOVERTER DEFIBRILLATOR  (ICD) GENERATOR CHANGE Left 12/12/2015   Procedure: DUAL LEAD PLACEMENT CARDIAC DIFIBRILLATOR;  Surgeon: [name], MD;  Location: [facility];  Service: Cardiovascular;  Laterality: Left;  . TONSILLECTOMY      SOCIAL HISTORY:  Social History  Substance Use Topics  . Smoking status: Former Smoker    Packs/day: 2.00    Years: 30.00    Quit date: 12/03/1996  . Smokeless tobacco: Never Used  . Alcohol use No    FAMILY HISTORY:  Family History  Problem Relation Age of Onset  . CAD Mother   . Cancer Mother   . Diabetes Mother   . Alzheimer's disease Father   . Cancer Father   . Heart disease Father     DRUG ALLERGIES:  Allergies  Allergen Reactions  . Benadryl [Diphenhydramine] Other (See Comments)    " Hyperactivity"  . Doxycycline Swelling    Pt went into pulmonary edema.  . Lopid [Gemfibrozil] Swelling    "I gain 1 pound a day for 30 days."    REVIEW OF SYSTEMS:   CONSTITUTIONAL: No fever, fatigue or weakness.  EYES: No blurred or double vision.  EARS, NOSE, AND THROAT: No tinnitus or ear pain.  RESPIRATORY: No cough, has shortness of breath,  No wheezing or hemoptysis.  CARDIOVASCULAR: No chest pain,  has orthopnea, edema.  GASTROINTESTINAL: No nausea, vomiting, diarrhea or abdominal pain.  GENITOURINARY: No dysuria, hematuria.  ENDOCRINE: No polyuria, nocturia,  HEMATOLOGY: No anemia, easy bruising or bleeding SKIN: No rash or lesion. MUSCULOSKELETAL: No joint pain or arthritis.   NEUROLOGIC:  No tingling, numbness, weakness.  PSYCHIATRY: No anxiety or depression.   MEDICATIONS AT HOME:  Prior to Admission medications   Medication Sig Start Date End Date Taking? Authorizing Provider  acetaminophen (TYLENOL) 325 MG tablet Take 2 tablets (650 mg total) by mouth every 6 (six) hours as needed for mild pain (or Fever >/= 101). 02/02/16  Yes Gouru, Illene Silver, MD  albuterol (PROVENTIL HFA;VENTOLIN HFA) 108 (90 Base) MCG/ACT inhaler Inhale 2 puffs into the lungs 4 (four)  times daily as needed for wheezing or shortness of breath.    Yes [provider]  albuterol-ipratropium (COMBIVENT) 18-103 MCG/ACT inhaler Inhale 1 puff into the lungs 2 times daily at 12 noon and 4 pm.   Yes [provider]  aspirin 81 MG tablet Take 81 mg by mouth every morning.   Yes [provider]  atorvastatin (LIPITOR) 40 MG tablet Take 80 mg by mouth every evening.    Yes [provider]  budesonide-formoterol (SYMBICORT) 80-4.5 MCG/ACT inhaler Inhale 2 puffs into the lungs 2 (two) times daily.   Yes [provider]  calcium carbonate (TUMS - DOSED IN MG ELEMENTAL CALCIUM) 500 MG chewable tablet Chew 1 tablet by mouth as needed for indigestion or heartburn.   Yes [provider]  carvedilol (COREG) 6.25 MG tablet Take 6.25 mg by mouth 2 (two) times daily with a meal.   Yes [provider]  citalopram (CELEXA) 20 MG tablet Take 20 mg by mouth 2 (two) times daily.   Yes [provider]  clopidogrel (PLAVIX) 75 MG tablet Take 75 mg by mouth every morning.   Yes [provider]  ferrous sulfate 325 (65 FE) MG tablet Take 325 mg by mouth 2 (two) times daily with a meal.   Yes [provider]  finasteride (PROSCAR) 5 MG tablet Take 5 mg by mouth at bedtime.   Yes [provider]  FLUoxetine (PROZAC) 20 MG capsule Take 20 mg by mouth daily.   Yes [provider]  furosemide (LASIX) 40 MG tablet Take 40 mg by mouth daily.   Yes [provider]  gabapentin (NEURONTIN) 300 MG capsule Take 600 mg by mouth 3 (three) times daily.    Yes [provider]  insulin glargine (LANTUS) 100 UNIT/ML injection Inject 90 Units into the skin at bedtime.    Yes [provider]  insulin lispro (HUMALOG) 100 UNIT/ML KiwkPen Inject 8-14 Units into the skin 3 (three) times daily with meals. Per sliding scale   Yes [provider]  isosorbide mononitrate (IMDUR) 60 MG 24 hr tablet  Take 60 mg by mouth daily.   Yes [provider]  lansoprazole (PREVACID) 15 MG capsule Take 30 mg by mouth 2 (two) times daily.   Yes [provider]  lisinopril (PRINIVIL,ZESTRIL) 20 MG tablet Take 20 mg by mouth daily.    Yes [provider]  loratadine (CLARITIN) 10 MG tablet Take 10 mg by mouth daily.   Yes [provider]  metFORMIN (GLUCOPHAGE) 1000 MG tablet Take 1,000 mg by mouth 2 (two) times daily with a meal.   Yes [provider]  Multiple Vitamins-Minerals (CENTRUM SILVER PO) Take 1 tablet by mouth every morning.   Yes [provider]  nitroGLYCERIN (NITROSTAT) 0.4 MG SL tablet Place 0.4 mg under the tongue every 5 (five) minutes as needed for chest pain.   Yes [provider]  pantoprazole (PROTONIX) 40 MG tablet Take 40 mg by mouth 2 (two) times  daily.   Yes [provider]  ranolazine (RANEXA) 500 MG 12 hr tablet Take 500 mg by mouth 2 (two) times daily.   Yes [provider]  spironolactone (ALDACTONE) 25 MG tablet Take 12.5 mg by mouth daily.   Yes [provider]  sucralfate (CARAFATE) 1 g tablet Take 1 g by mouth 2 (two) times daily.   Yes [provider]  tamsulosin (FLOMAX) 0.4 MG CAPS capsule Take 0.4 mg by mouth daily after supper.   Yes [provider]  torsemide (DEMADEX) 20 MG tablet Take 40 mg by mouth daily. May take 1-2 tablets as needed for fluid   Yes [provider]  Ubiquinol 100 MG CAPS Take by mouth. daily    [provider]      PHYSICAL EXAMINATION:   VITAL SIGNS: Blood pressure (!) 157/66, pulse 76, temperature 98.3 F (36.8 C), temperature source Oral, resp. rate (!) 23, height 5\' 8"  (1.727 m), weight 115.3 kg (254 lb 3.1 oz), SpO2 96 %.  GENERAL:  71 y.o.-year-old patient lying in the bed with no acute distress.  EYES: Pupils equal, round, reactive to light and accommodation. No scleral icterus. Extraocular muscles intact.   HEENT: Head atraumatic, normocephalic. Oropharynx and nasopharynx clear.  NECK:  Supple, no jugular venous distention. No thyroid enlargement, no tenderness.  LUNGS: Decreased breath sounds bilaterally, bibasilar crepitations heard. No use of accessory muscles of respiration.  CARDIOVASCULAR: S1, S2 normal. No murmurs, rubs, or gallops.  ABDOMEN: Soft, nontender, nondistended. Bowel sounds present. No organomegaly or mass.  EXTREMITIES: Has pedal edema,  No cyanosis, or clubbing.  NEUROLOGIC: Cranial nerves II through XII are intact. Muscle strength 5/5 in all extremities. Sensation intact. Gait not checked.  PSYCHIATRIC: The patient is alert and oriented x 3.  SKIN: No obvious rash, lesion, or ulcer.   LABORATORY PANEL:   CBC  Recent Labs Lab 04/19/17 2317  WBC 9.6  HGB 11.5*  HCT 32.9*  PLT 205  MCV 87.9  MCH 30.7  MCHC 34.9  RDW 14.9*   ------------------------------------------------------------------------------------------------------------------  Chemistries   Recent Labs Lab 04/19/17 2317  NA 138  K 3.1*  CL 101  CO2 27  GLUCOSE 211*  BUN 17  CREATININE 1.39*  CALCIUM 8.2*   ------------------------------------------------------------------------------------------------------------------ estimated creatinine clearance is 59.2 mL/min (A) (by C-G formula based on SCr of 1.39 mg/dL (H)). ------------------------------------------------------------------------------------------------------------------ No results for input(s): TSH, T4TOTAL, T3FREE, THYROIDAB in the last 72 hours.  Invalid input(s): FREET3   Coagulation profile No results for input(s): INR, PROTIME in the last 168 hours. ------------------------------------------------------------------------------------------------------------------- No results for input(s): DDIMER in the last 72  hours. -------------------------------------------------------------------------------------------------------------------  Cardiac Enzymes  Recent Labs Lab 04/19/17 2317  TROPONINI <0.03   ------------------------------------------------------------------------------------------------------------------ Invalid input(s): POCBNP  ---------------------------------------------------------------------------------------------------------------  Urinalysis    Component Value Date/Time   COLORURINE YELLOW (A) 01/31/2016 0327   APPEARANCEUR CLEAR (A) 01/31/2016 0327   APPEARANCEUR Hazy 11/26/2012 2236   LABSPEC 1.010 01/31/2016 0327   LABSPEC 1.025 11/26/2012 2236   PHURINE 5.0 01/31/2016 0327   GLUCOSEU NEGATIVE 01/31/2016 0327   GLUCOSEU Negative 11/26/2012 2236   HGBUR NEGATIVE 01/31/2016 0327   BILIRUBINUR NEGATIVE 01/31/2016 0327   BILIRUBINUR Negative 11/26/2012 2236   KETONESUR NEGATIVE 01/31/2016 0327   PROTEINUR NEGATIVE 01/31/2016 0327   NITRITE NEGATIVE 01/31/2016 0327   LEUKOCYTESUR NEGATIVE 01/31/2016 0327   LEUKOCYTESUR Negative 11/26/2012 2236     RADIOLOGY: Dg Chest Portable 1 View  Result Date: 04/19/2017 CLINICAL DATA:  Acute onset of shortness  of breath tonight. EXAM: PORTABLE CHEST 1 VIEW COMPARISON:  12/15/2016 FINDINGS: Multilead left-sided pacemaker, leads are intact. Post median sternotomy and CABG. Cardiomegaly is stable. Mild increased interstitial opacities suspicious for pulmonary edema. Minimal blunting of both costophrenic angles may be tiny effusions or secondary to hyperinflation. No focal airspace disease. No pneumothorax. There is degenerative change in the spine. IMPRESSION: Mild pulmonary edema. Small pleural effusions versus hyperinflation. Electronically Signed   By: Jeb Levering M.D.   On: 04/19/2017 23:41    EKG: Orders placed or performed during the hospital encounter of 04/19/17  . ED EKG  . ED EKG    IMPRESSION AND  PLAN: 71 year old male patient with history of ischemic cardio myopathy, congestive heart failure, COPD, diabetes mellitus type 2, GERD, hyperlipidemia presented to the emergency room with increased shortness of breath. Admitting diagnosis 1. Acute on chronic heart failure 2. Fluid overload 3. Hypokalemia 4. Cardiomyopathy 5. Diabetes mellitus Treatment plan Admit patient to stepdown unit Start patient on BiPAP Diurese patient with IV Lasix 40 MG every 12 hourly Check echocardiogram Replace potassium Cardiology consultation Resume cardiac medications  All the records are reviewed and case discussed with ED provider. Management plans discussed with the patient, family and they are in agreement.  CODE STATUS:FULL CODE Surrogate decision maker : wife Code Status History    Date Active Date Inactive Code Status Order ID Comments User Context   01/31/2016  1:43 AM 02/02/2016  9:11 PM Full Code [id number]  Hillary Bow, MD ED   12/12/2015  4:27 PM 12/13/2015  3:29 PM Full Code [id number]  [name], MD Inpatient    Advance Directive Documentation     Most Recent Value  Type of Advance Directive  Healthcare Power of [place], Living will  Pre-existing out of facility DNR order (yellow form or pink MOST form)  -  "MOST" Form in Place?  -       TOTAL TIME TAKING CARE OF THIS PATIENT: 53 minutes.    Saundra Shelling M.D on 04/20/2017 at 2:12 AM  Between 7am to 6pm - Pager - [phone]  After 6pm go to [email or web address] - password [email or web address] [facility]  Office  [phone]  CC: Primary care physician; [name], MD

## 2017-04-20 NOTE — Progress Notes (Signed)
eLink Physician-Brief Progress Note Patient Name: [name] DOB: 02-09-1946 MRN: [id number]   Date of Service  04/20/2017  HPI/Events of Note  New patient evaluation. Patient admitted with acute on chronic dyspnea. He has acute on chronic CHF exacerbation.   Currently on BiPAP. Comfortable. Not in distress.  Blood pressure 120/60, heart rate 72, paced. O2 saturation 92%. Respiratory rate 22.   eICU Interventions  Cont current management Cont bipap      Intervention Category Evaluation Type: New Patient Evaluation  [place] 04/20/2017, 4:00 AM

## 2017-04-20 NOTE — Progress Notes (Signed)
Attempted to call Report to 2A RN- pt to be transferred to room 249. RN busy, states will call back to receive report.

## 2017-04-20 NOTE — ED Notes (Signed)
413ml urine emptied from urinal

## 2017-04-20 NOTE — Progress Notes (Signed)
Inpatient Diabetes Program Recommendations  AACE/ADA: New Consensus Statement on Inpatient Glycemic Control (2015)  Target Ranges:  Prepandial:   less than 140 mg/dL      Peak postprandial:   less than 180 mg/dL (1-2 hours)      Critically ill patients:  140 - 180 mg/dL   Results for [name], [name] (MRN [id number]) as of 04/20/2017 15:36  Ref. Range 04/20/2017 02:36 04/20/2017 12:48  Glucose-Capillary Latest Ref Range: 65 - 99 mg/dL 102 (H) 107 (H)   Review of Glycemic Control  Diabetes history: DM2 Outpatient Diabetes medications: Lantus 100 units QHS, Humalog 8-18 units TID with meals Current orders for Inpatient glycemic control: Novolog 0-9 units TID with meals, Novolog 0-5 units QHS  Inpatient Diabetes Program Recommendations: Insulin - Basal: Patient confirms that he took Lantus 100 units around 10pm on 04/19/17 prior to coming to the hospital. Please consider ordering Lantus at lower dose; recommend starting with Lantus 56 units QHS (based on 111 kg x 0.5 units).    NOTE: Spoke with patient about diabetes and home regimen for diabetes control. Patient reports that he is followed by Dr. Gabriel Carina for diabetes management and currently he takes Lantus 100 units QHS, Humalog 8-18 units TID with meals as an outpatient for diabetes control. Inquired about Metformin listed on home medication list in chart and patient reports that he has been off Metformin for a while (as directed by his MD).  Patient reports that he is taking insulin as prescribed and that he last seen Dr. Gabriel Carina on 03/23/17.  Patient reports that no changes were made with his insulin at his last office visit. Patient confirmed that he took Lantus 100 units around 10pm on 04/19/17 prior to coming to the hospital. Discussed current Novolog correction scale ordered and informed patient a request for basal insulin (at lower dose than taken as an outpatient) would be recommended.  Patient verbalized understanding of information discussed  and he states that he has no further questions at this time related to diabetes.  Per Dr. Joycie Peek office note for 03/23/17 "Metformin was stopped in 12/2016 in setting of acute exacerbation of chronic heart failure. Due to noting higher sugars, he adjusted up his Lantus insulin (taking differently: Inject 100 Units subcutaneously nightly). Hgb A1c now 6.7%. His Prodigy glucometer was downloaded and reviewed. Date and time on meter are incorrect. He checks fasting sugars and they have ranged 111 - 186 over the last 2 weeks. He is tolerating his medications. Gets medications from the [facility] Diabetes is controlled. Continue current regimen for diabetes. Continue to hold metformin. Advised that we could consider restarting in future if heart failure remains stable. "   Thanks, Barnie Alderman, RN, MSN, CDE Diabetes Coordinator Inpatient Diabetes Program [phone] (Team Pager from 8am to 5pm)

## 2017-04-20 NOTE — Progress Notes (Signed)
Patient's Blood pressure 105/52 map 66 HR in the 80s, repeat BP 104/59. NP, D.B made aware- states to hold all blood pressure at this time. Ok to administer other medications.

## 2017-04-21 DIAGNOSIS — E785 Hyperlipidemia, unspecified: Secondary | ICD-10-CM

## 2017-04-21 DIAGNOSIS — I5023 Acute on chronic systolic (congestive) heart failure: Secondary | ICD-10-CM

## 2017-04-21 DIAGNOSIS — I10 Essential (primary) hypertension: Secondary | ICD-10-CM

## 2017-04-21 DIAGNOSIS — I2510 Atherosclerotic heart disease of native coronary artery without angina pectoris: Secondary | ICD-10-CM

## 2017-04-21 DIAGNOSIS — E876 Hypokalemia: Secondary | ICD-10-CM

## 2017-04-21 DIAGNOSIS — J9621 Acute and chronic respiratory failure with hypoxia: Secondary | ICD-10-CM

## 2017-04-21 LAB — CBC
HEMATOCRIT: 29.5 % — AB (ref 40.0–52.0)
HEMOGLOBIN: 10.2 g/dL — AB (ref 13.0–18.0)
MCH: 30.9 pg (ref 26.0–34.0)
MCHC: 34.5 g/dL (ref 32.0–36.0)
MCV: 89.4 fL (ref 80.0–100.0)
Platelets: 162 10*3/uL (ref 150–440)
RBC: 3.3 MIL/uL — ABNORMAL LOW (ref 4.40–5.90)
RDW: 15.4 % — ABNORMAL HIGH (ref 11.5–14.5)
WBC: 7.4 10*3/uL (ref 3.8–10.6)

## 2017-04-21 LAB — BASIC METABOLIC PANEL
ANION GAP: 8 (ref 5–15)
BUN: 21 mg/dL — ABNORMAL HIGH (ref 6–20)
CHLORIDE: 104 mmol/L (ref 101–111)
CO2: 29 mmol/L (ref 22–32)
Calcium: 8.2 mg/dL — ABNORMAL LOW (ref 8.9–10.3)
Creatinine, Ser: 1.26 mg/dL — ABNORMAL HIGH (ref 0.61–1.24)
GFR calc Af Amer: >60 mL/min (ref >60)
GFR, EST NON AFRICAN AMERICAN: 55 mL/min — AB (ref >60)
GLUCOSE: 136 mg/dL — AB (ref 65–99)
POTASSIUM: 3.7 mmol/L (ref 3.5–5.1)
Sodium: 141 mmol/L (ref 135–145)

## 2017-04-21 LAB — GLUCOSE, CAPILLARY
GLUCOSE-CAPILLARY: 133 mg/dL — AB (ref 65–99)
Glucose-Capillary: 158 mg/dL — ABNORMAL HIGH (ref 65–99)

## 2017-04-21 LAB — MAGNESIUM: Magnesium: 2.5 mg/dL — ABNORMAL HIGH (ref 1.7–2.4)

## 2017-04-21 MED ORDER — POTASSIUM CHLORIDE CRYS ER 20 MEQ PO TBCR
20.0000 meq | EXTENDED_RELEASE_TABLET | Freq: Two times a day (BID) | ORAL | Status: DC
  Administered 2017-04-21: 20 meq via ORAL
  Filled 2017-04-21: qty 1

## 2017-04-21 MED ORDER — POTASSIUM CHLORIDE ER 10 MEQ PO TBCR: 10.0000 meq | EXTENDED_RELEASE_TABLET | Freq: Every day | ORAL | 3 refills | Status: DC

## 2017-04-21 MED ORDER — ATORVASTATIN CALCIUM 80 MG PO TABS: 80.0000 mg | ORAL_TABLET | Freq: Every evening | ORAL | 3 refills | Status: DC

## 2017-04-21 NOTE — Progress Notes (Signed)
SATURATION QUALIFICATIONS: (This note is used to comply with regulatory documentation for home oxygen Patient Saturations on Room Air at Rest = 95%  Patient Saturations on Room Air while Ambulating = 93%  Patient Saturations on 2 Liters of oxygen while Ambulating = 99%  Please briefly explain why patient needs home oxygen:

## 2017-04-21 NOTE — Care Management (Signed)
Discussed need for home 02 assessment prior to discharge.

## 2017-04-21 NOTE — Discharge Instructions (Signed)
Heart Failure Clinic appointment on Apr 25, 2017 at 10:20am with Darylene Price, [place]. Please call [phone] to reschedule.

## 2017-04-21 NOTE — Discharge Summary (Signed)
[facility] NAME: [name]    MR#:  [id number]  DATE OF BIRTH:  1946-08-05  DATE OF ADMISSION:  04/19/2017 ADMITTING PHYSICIAN: Saundra Shelling, MD  DATE OF DISCHARGE: 04/21/2017  3:11 PM  PRIMARY CARE PHYSICIAN: [name], MD     ADMISSION DIAGNOSIS:  Acute on chronic congestive heart failure, unspecified heart failure type ([place]) [I50.9]  DISCHARGE DIAGNOSIS:  Active Problems:   CHF (congestive heart failure) (HCC)   Acute on chronic systolic CHF (congestive heart failure) (HCC)   Acute on chronic respiratory failure with hypoxia (HCC)   Hypokalemia   Hypomagnesemia   Essential hypertension   Hyperlipidemia   Coronary artery disease   SECONDARY DIAGNOSIS:   Past Medical History:  Diagnosis Date  . Anemia   . Cancer ([place]) 12/2013   prostate  . Cardiogenic pulmonary edema ([place]) 12/19/2014  . Cardiomyopathy, ischemic   . CHF (congestive heart failure) ([place])   . COPD (chronic obstructive pulmonary disease) ([place])   . Diabetes mellitus without complication ([place])   . GERD (gastroesophageal reflux disease)   . Hypercholesteremia   . Hypertension   . Myocardial infarction ([place]) [id number]  . Shortness of breath dyspnea     .pro HOSPITAL COURSE:   The patient is 71 year old male with past medical history significant for history of ischemic cardiomyopathy, CHF, COPD, diabetes, hypertension, hyperlipidemia, who presented to the hospital with shortness of breath for 1 day. On arrival to the hospital, he was noted to have fluid overload, he was given intravenous Lasix, Diuresed about 1.7 L, and improved, he was weaned off oxygen and did well on room air on ambulation with O2 sats of 93-94%. He denied chest pains. The patient was seen by cardiologist and recommended no further diagnostics. Patient was noted to be hypokalemic, hypomagnesemic while in the hospital, he had a potassium as well as magnesium  supplementation added to his medication regimen upon discharge. He was felt to be stable to be discharged home today. He was advised to follow-up with his primary care physician within one week after discharge. Discussion by problem: #1. Acute on chronic systolic CHF, continue Lasix orally, following ,weight as outpatient, patient was weaned off oxygen , now on the room air with good O2 sats on exertion. echocardiogram was performed, however, results are still pending. The patient was seen by cardiologist, Dr. Nehemiah Massed, who recommended no further cardiac diagnostics and cardiologist follow-up as outpatient   #2. Acute on chronic respiratory failure with hypoxia, weaned off oxygen , he is to continue BiPAP and oxygen at nighttime as previously scheduled. O2 sats adequate on room air on exertion.  #3. Coronary artery disease, no chest pains, cardiac enzymes are negative, continue outpatient medications #4. Hypokalemia,  Resolved with therapy, magnesium was also found to be low, supplemented, patient will have magnesium as well as potassium supplementation prescribed upon discharge .  #5. Essential hypertension, well controlled, continue outpatient medications  #6. Hyperlipidemia, continue Lipitor    DISCHARGE CONDITIONS:   Stable  CONSULTS OBTAINED:  Treatment Team:  [name], MD  DRUG ALLERGIES:   Allergies  Allergen Reactions  . Benadryl [Diphenhydramine] Other (See Comments)    " Hyperactivity"  . Doxycycline Swelling    Pt went into pulmonary edema.  . Lopid [Gemfibrozil] Swelling    "I gain 1 pound a day for 30 days."    DISCHARGE MEDICATIONS:   Discharge Medication List as of 04/21/2017  1:46 PM  START taking these medications   Details  potassium chloride (K-DUR) 10 MEQ tablet Take 1 tablet (10 mEq total) by mouth daily., Starting Thu 04/21/2017, Normal      CONTINUE these medications which have CHANGED   Details  atorvastatin (LIPITOR) 80 MG tablet Take 1  tablet (80 mg total) by mouth every evening., Starting Thu 04/21/2017, Normal      CONTINUE these medications which have NOT CHANGED   Details  acetaminophen (TYLENOL) 325 MG tablet Take 2 tablets (650 mg total) by mouth every 6 (six) hours as needed for mild pain (or Fever >/= 101)., Starting Mon 02/02/2016, OTC    albuterol (PROVENTIL HFA;VENTOLIN HFA) 108 (90 Base) MCG/ACT inhaler Inhale 2 puffs into the lungs 4 (four) times daily as needed for wheezing or shortness of breath. , Historical Med    albuterol-ipratropium (COMBIVENT) 18-103 MCG/ACT inhaler Inhale 1 puff into the lungs 2 times daily at 12 noon and 4 pm., Historical Med    aspirin 81 MG tablet Take 81 mg by mouth every morning., Until Discontinued, Historical Med    budesonide-formoterol (SYMBICORT) 80-4.5 MCG/ACT inhaler Inhale 2 puffs into the lungs 2 (two) times daily., Until Discontinued, Historical Med    calcium carbonate (TUMS - DOSED IN MG ELEMENTAL CALCIUM) 500 MG chewable tablet Chew 1 tablet by mouth as needed for indigestion or heartburn., Historical Med    carvedilol (COREG) 6.25 MG tablet Take 6.25 mg by mouth 2 (two) times daily with a meal., Historical Med    citalopram (CELEXA) 20 MG tablet Take 20 mg by mouth 2 (two) times daily., Historical Med    clopidogrel (PLAVIX) 75 MG tablet Take 75 mg by mouth every morning., Until Discontinued, Historical Med    ferrous sulfate 325 (65 FE) MG tablet Take 325 mg by mouth 2 (two) times daily with a meal., Until Discontinued, Historical Med    finasteride (PROSCAR) 5 MG tablet Take 5 mg by mouth at bedtime., Until Discontinued, Historical Med    FLUoxetine (PROZAC) 20 MG capsule Take 20 mg by mouth daily., Historical Med    furosemide (LASIX) 40 MG tablet Take 40 mg by mouth daily., Historical Med    gabapentin (NEURONTIN) 300 MG capsule Take 600 mg by mouth 3 (three) times daily. , Historical Med    insulin glargine (LANTUS) 100 UNIT/ML injection Inject 100 Units  into the skin at bedtime. , Historical Med    insulin lispro (HUMALOG) 100 UNIT/ML KiwkPen Inject 8-18 Units into the skin 3 (three) times daily with meals. Per sliding scale, Historical Med    isosorbide mononitrate (IMDUR) 60 MG 24 hr tablet Take 60 mg by mouth daily., Historical Med    lansoprazole (PREVACID) 15 MG capsule Take 30 mg by mouth 2 (two) times daily., Historical Med    lisinopril (PRINIVIL,ZESTRIL) 20 MG tablet Take 20 mg by mouth daily. , Historical Med    loratadine (CLARITIN) 10 MG tablet Take 10 mg by mouth daily., Until Discontinued, Historical Med    Multiple Vitamins-Minerals (CENTRUM SILVER PO) Take 1 tablet by mouth every morning., Until Discontinued, Historical Med    nitroGLYCERIN (NITROSTAT) 0.4 MG SL tablet Place 0.4 mg under the tongue every 5 (five) minutes as needed for chest pain., Until Discontinued, Historical Med    pantoprazole (PROTONIX) 40 MG tablet Take 40 mg by mouth 2 (two) times daily., Historical Med    ranolazine (RANEXA) 500 MG 12 hr tablet Take 500 mg by mouth 2 (two) times daily., Until Discontinued, Historical  Med    spironolactone (ALDACTONE) 25 MG tablet Take 12.5 mg by mouth daily., Historical Med    sucralfate (CARAFATE) 1 g tablet Take 1 g by mouth 2 (two) times daily., Historical Med    tamsulosin (FLOMAX) 0.4 MG CAPS capsule Take 0.4 mg by mouth daily after supper., Until Discontinued, Historical Med    torsemide (DEMADEX) 20 MG tablet Take 40 mg by mouth daily. May take 1-2 tablets as needed for fluid, Historical Med    Ubiquinol 100 MG CAPS Take by mouth. daily, Historical Med      STOP taking these medications     metFORMIN (GLUCOPHAGE) 1000 MG tablet      cyanocobalamin 1000 MCG tablet      ranitidine (ZANTAC) 150 MG tablet          DISCHARGE INSTRUCTIONS:    The patient is to follow-up with primary care physician and cardiologist as outpatient  If you experience worsening of your admission symptoms, develop  shortness of breath, life threatening emergency, suicidal or homicidal thoughts you must seek medical attention immediately by calling 911 or calling your MD immediately  if symptoms less severe.  You Must read complete instructions/literature along with all the possible adverse reactions/side effects for all the Medicines you take and that have been prescribed to you. Take any new Medicines after you have completely understood and accept all the possible adverse reactions/side effects.   Please note  You were cared for by a hospitalist during your hospital stay. If you have any questions about your discharge medications or the care you received while you were in the hospital after you are discharged, you can call the unit and asked to speak with the hospitalist on call if the hospitalist that took care of you is not available. Once you are discharged, your primary care physician will handle any further medical issues. Please note that NO REFILLS for any discharge medications will be authorized once you are discharged, as it is imperative that you return to your primary care physician (or establish a relationship with a primary care physician if you do not have one) for your aftercare needs so that they can reassess your need for medications and monitor your lab values.    Today   CHIEF COMPLAINT:   Chief Complaint  Patient presents with  . Shortness of Breath    HISTORY OF PRESENT ILLNESS:    VITAL SIGNS:  Blood pressure (!) 111/42, pulse 77, temperature 98.2 F (36.8 C), temperature source Oral, resp. rate 18, height 5\' 8"  (1.727 m), weight 110.4 kg (243 lb 6.4 oz), SpO2 94 %.  I/O:   Intake/Output Summary (Last 24 hours) at 04/21/17 1622 Last data filed at 04/21/17 1442  Gross per 24 hour  Intake              720 ml  Output             1900 ml  Net            -1180 ml    PHYSICAL EXAMINATION:  GENERAL:  71 y.o.-year-old patient lying in the bed with no acute distress.  EYES:  Pupils equal, round, reactive to light and accommodation. No scleral icterus. Extraocular muscles intact.  HEENT: Head atraumatic, normocephalic. Oropharynx and nasopharynx clear.  NECK:  Supple, no jugular venous distention. No thyroid enlargement, no tenderness.  LUNGS: Normal breath sounds bilaterally, no wheezing, rales,rhonchi or crepitation. No use of accessory muscles of respiration.  CARDIOVASCULAR: S1, S2 normal.  No murmurs, rubs, or gallops.  ABDOMEN: Soft, non-tender, non-distended. Bowel sounds present. No organomegaly or mass.  EXTREMITIES: No pedal edema, cyanosis, or clubbing.  NEUROLOGIC: Cranial nerves II through XII are intact. Muscle strength 5/5 in all extremities. Sensation intact. Gait not checked.  PSYCHIATRIC: The patient is alert and oriented x 3.  SKIN: No obvious rash, lesion, or ulcer.   DATA REVIEW:   CBC  Recent Labs Lab 04/21/17 0512  WBC 7.4  HGB 10.2*  HCT 29.5*  PLT 162    Chemistries   Recent Labs Lab 04/21/17 0512  NA 141  K 3.7  CL 104  CO2 29  GLUCOSE 136*  BUN 21*  CREATININE 1.26*  CALCIUM 8.2*  MG 2.5*    Cardiac Enzymes  Recent Labs Lab 04/19/17 2317  TROPONINI <0.03    Microbiology Results  Results for orders placed or performed during the hospital encounter of 04/19/17  MRSA PCR Screening     Status: None   Collection Time: 04/20/17  2:31 AM  Result Value Ref Range Status   MRSA by PCR NEGATIVE NEGATIVE Final    Comment:        The GeneXpert MRSA Assay (FDA approved for NASAL specimens only), is one component of a comprehensive MRSA colonization surveillance program. It is not intended to diagnose MRSA infection nor to guide or monitor treatment for MRSA infections.     RADIOLOGY:  Dg Chest Portable 1 View  Result Date: 04/19/2017 CLINICAL DATA:  Acute onset of shortness of breath tonight. EXAM: PORTABLE CHEST 1 VIEW COMPARISON:  12/15/2016 FINDINGS: Multilead left-sided pacemaker, leads are intact. Post  median sternotomy and CABG. Cardiomegaly is stable. Mild increased interstitial opacities suspicious for pulmonary edema. Minimal blunting of both costophrenic angles may be tiny effusions or secondary to hyperinflation. No focal airspace disease. No pneumothorax. There is degenerative change in the spine. IMPRESSION: Mild pulmonary edema. Small pleural effusions versus hyperinflation. Electronically Signed   By: Jeb Levering M.D.   On: 04/19/2017 23:41    EKG:   Orders placed or performed during the hospital encounter of 04/19/17  . ED EKG  . ED EKG  . EKG 12-Lead  . EKG 12-Lead      Management plans discussed with the patient, family and they are in agreement.  CODE STATUS:     Code Status Orders        Start     Ordered   04/20/17 0232  Full code  Continuous     04/20/17 0232    Code Status History    Date Active Date Inactive Code Status Order ID Comments User Context   01/31/2016  1:43 AM 02/02/2016  9:11 PM Full Code [id number]  Hillary Bow, MD ED   12/12/2015  4:27 PM 12/13/2015  3:29 PM Full Code [id number]  [name], MD Inpatient    Advance Directive Documentation     Most Recent Value  Type of Advance Directive  Healthcare Power of [place], Living will  Pre-existing out of facility DNR order (yellow form or pink MOST form)  -  "MOST" Form in Place?  -      TOTAL TIME TAKING CARE OF THIS PATIENT: 40 minutes.    Theodoro [name] M.D on 04/21/2017 at 4:22 PM  Between 7am to 6pm - Pager - [phone]  After 6pm go to [email or web address] - password [email or web address] [facility]  Office  [phone]  CC: Primary care physician; [name], MD

## 2017-04-21 NOTE — Progress Notes (Signed)
Patient is being discharge home at this time, discharge instruction provided, iv removed tele removed

## 2017-04-22 LAB — ECHOCARDIOGRAM COMPLETE
HEIGHTINCHES: 68 in
Weight: 3936.53 oz

## 2017-04-25 ENCOUNTER — Ambulatory Visit: Payer: PPO | Admitting: Family

## 2017-04-26 ENCOUNTER — Telehealth: Payer: Self-pay | Admitting: Family

## 2017-04-26 NOTE — Telephone Encounter (Signed)
Patient did not show for his [facility] appointment on 04/25/17. Will attempt to reschedule.

## 2017-04-27 DIAGNOSIS — I5023 Acute on chronic systolic (congestive) heart failure: Secondary | ICD-10-CM | POA: Diagnosis not present

## 2017-05-04 NOTE — Care Management (Signed)
Post discharge note: Discharge summary faxed to [facility].

## 2017-05-09 DIAGNOSIS — I5023 Acute on chronic systolic (congestive) heart failure: Secondary | ICD-10-CM | POA: Diagnosis not present

## 2017-05-09 DIAGNOSIS — I25810 Atherosclerosis of coronary artery bypass graft(s) without angina pectoris: Secondary | ICD-10-CM | POA: Diagnosis not present

## 2017-05-09 DIAGNOSIS — I10 Essential (primary) hypertension: Secondary | ICD-10-CM | POA: Diagnosis not present

## 2017-06-14 DIAGNOSIS — C61 Malignant neoplasm of prostate: Secondary | ICD-10-CM | POA: Diagnosis not present

## 2017-06-30 ENCOUNTER — Encounter: Payer: Self-pay | Admitting: Emergency Medicine

## 2017-06-30 ENCOUNTER — Inpatient Hospital Stay
Admission: EM | Admit: 2017-06-30 | Discharge: 2017-07-02 | DRG: 291 | Disposition: A | Discharge status: Home / Self Care | Payer: PPO | Attending: Internal Medicine | Admitting: Internal Medicine

## 2017-06-30 ENCOUNTER — Emergency Department: Payer: PPO

## 2017-06-30 DIAGNOSIS — E7800 Pure hypercholesterolemia, unspecified: Secondary | ICD-10-CM | POA: Diagnosis present

## 2017-06-30 DIAGNOSIS — J449 Chronic obstructive pulmonary disease, unspecified: Secondary | ICD-10-CM | POA: Diagnosis present

## 2017-06-30 DIAGNOSIS — I420 Dilated cardiomyopathy: Secondary | ICD-10-CM | POA: Diagnosis present

## 2017-06-30 DIAGNOSIS — Z955 Presence of coronary angioplasty implant and graft: Secondary | ICD-10-CM | POA: Diagnosis not present

## 2017-06-30 DIAGNOSIS — Z8249 Family history of ischemic heart disease and other diseases of the circulatory system: Secondary | ICD-10-CM | POA: Diagnosis not present

## 2017-06-30 DIAGNOSIS — I10 Essential (primary) hypertension: Secondary | ICD-10-CM | POA: Diagnosis present

## 2017-06-30 DIAGNOSIS — Z79899 Other long term (current) drug therapy: Secondary | ICD-10-CM

## 2017-06-30 DIAGNOSIS — J810 Acute pulmonary edema: Secondary | ICD-10-CM | POA: Diagnosis present

## 2017-06-30 DIAGNOSIS — I110 Hypertensive heart disease with heart failure: Principal | ICD-10-CM | POA: Diagnosis present

## 2017-06-30 DIAGNOSIS — Z888 Allergy status to other drugs, medicaments and biological substances status: Secondary | ICD-10-CM

## 2017-06-30 DIAGNOSIS — Z833 Family history of diabetes mellitus: Secondary | ICD-10-CM

## 2017-06-30 DIAGNOSIS — Z95810 Presence of automatic (implantable) cardiac defibrillator: Secondary | ICD-10-CM

## 2017-06-30 DIAGNOSIS — G4733 Obstructive sleep apnea (adult) (pediatric): Secondary | ICD-10-CM | POA: Diagnosis present

## 2017-06-30 DIAGNOSIS — Z951 Presence of aortocoronary bypass graft: Secondary | ICD-10-CM | POA: Diagnosis not present

## 2017-06-30 DIAGNOSIS — I5023 Acute on chronic systolic (congestive) heart failure: Secondary | ICD-10-CM | POA: Diagnosis present

## 2017-06-30 DIAGNOSIS — Z7902 Long term (current) use of antithrombotics/antiplatelets: Secondary | ICD-10-CM | POA: Diagnosis not present

## 2017-06-30 DIAGNOSIS — M25872 Other specified joint disorders, left ankle and foot: Secondary | ICD-10-CM | POA: Diagnosis not present

## 2017-06-30 DIAGNOSIS — Z87891 Personal history of nicotine dependence: Secondary | ICD-10-CM | POA: Diagnosis not present

## 2017-06-30 DIAGNOSIS — Z7982 Long term (current) use of aspirin: Secondary | ICD-10-CM | POA: Diagnosis not present

## 2017-06-30 DIAGNOSIS — J9621 Acute and chronic respiratory failure with hypoxia: Secondary | ICD-10-CM | POA: Diagnosis present

## 2017-06-30 DIAGNOSIS — R0602 Shortness of breath: Secondary | ICD-10-CM | POA: Diagnosis not present

## 2017-06-30 DIAGNOSIS — Z6837 Body mass index (BMI) 37.0-37.9, adult: Secondary | ICD-10-CM

## 2017-06-30 DIAGNOSIS — I25810 Atherosclerosis of coronary artery bypass graft(s) without angina pectoris: Secondary | ICD-10-CM | POA: Diagnosis not present

## 2017-06-30 DIAGNOSIS — K219 Gastro-esophageal reflux disease without esophagitis: Secondary | ICD-10-CM | POA: Diagnosis present

## 2017-06-30 DIAGNOSIS — I509 Heart failure, unspecified: Secondary | ICD-10-CM | POA: Diagnosis not present

## 2017-06-30 DIAGNOSIS — E785 Hyperlipidemia, unspecified: Secondary | ICD-10-CM | POA: Diagnosis present

## 2017-06-30 DIAGNOSIS — E119 Type 2 diabetes mellitus without complications: Secondary | ICD-10-CM | POA: Diagnosis present

## 2017-06-30 DIAGNOSIS — Z794 Long term (current) use of insulin: Secondary | ICD-10-CM | POA: Diagnosis not present

## 2017-06-30 DIAGNOSIS — M25572 Pain in left ankle and joints of left foot: Secondary | ICD-10-CM | POA: Diagnosis not present

## 2017-06-30 DIAGNOSIS — J811 Chronic pulmonary edema: Secondary | ICD-10-CM | POA: Diagnosis not present

## 2017-06-30 DIAGNOSIS — I2510 Atherosclerotic heart disease of native coronary artery without angina pectoris: Secondary | ICD-10-CM | POA: Diagnosis present

## 2017-06-30 DIAGNOSIS — Z7951 Long term (current) use of inhaled steroids: Secondary | ICD-10-CM

## 2017-06-30 DIAGNOSIS — I252 Old myocardial infarction: Secondary | ICD-10-CM

## 2017-06-30 DIAGNOSIS — Z881 Allergy status to other antibiotic agents status: Secondary | ICD-10-CM

## 2017-06-30 LAB — CBC
HEMATOCRIT: 30 % — AB (ref 40.0–52.0)
Hemoglobin: 10.4 g/dL — ABNORMAL LOW (ref 13.0–18.0)
MCH: 30.6 pg (ref 26.0–34.0)
MCHC: 34.6 g/dL (ref 32.0–36.0)
MCV: 88.3 fL (ref 80.0–100.0)
PLATELETS: 183 10*3/uL (ref 150–440)
RBC: 3.39 MIL/uL — AB (ref 4.40–5.90)
RDW: 14.1 % (ref 11.5–14.5)
WBC: 11.4 10*3/uL — ABNORMAL HIGH (ref 3.8–10.6)

## 2017-06-30 LAB — BASIC METABOLIC PANEL
ANION GAP: 14 (ref 5–15)
BUN: 17 mg/dL (ref 6–20)
CALCIUM: 8.5 mg/dL — AB (ref 8.9–10.3)
CO2: 23 mmol/L (ref 22–32)
CREATININE: 1.55 mg/dL — AB (ref 0.61–1.24)
Chloride: 105 mmol/L (ref 101–111)
GFR calc Af Amer: 50 mL/min — ABNORMAL LOW (ref >60)
GFR calc non Af Amer: 43 mL/min — ABNORMAL LOW (ref >60)
GLUCOSE: 160 mg/dL — AB (ref 65–99)
Potassium: 4.3 mmol/L (ref 3.5–5.1)
Sodium: 142 mmol/L (ref 135–145)

## 2017-06-30 LAB — BLOOD GAS, VENOUS
ACID-BASE DEFICIT: 2 mmol/L (ref 0.0–2.0)
Bicarbonate: 25.7 mmol/L (ref 20.0–28.0)
FIO2: 1
O2 Saturation: 80 %
PCO2 VEN: 56 mmHg (ref 44.0–60.0)
PH VEN: 7.27 (ref 7.250–7.430)
Patient temperature: 37
pO2, Ven: 51 mmHg — ABNORMAL HIGH (ref 32.0–45.0)

## 2017-06-30 LAB — TROPONIN I: Troponin I: <0.03 ng/mL (ref <0.03)

## 2017-06-30 MED ORDER — NITROGLYCERIN 0.4 MG SL SUBL
0.4000 mg | SUBLINGUAL_TABLET | SUBLINGUAL | Status: DC | PRN
  Administered 2017-06-30: 0.4 mg via SUBLINGUAL

## 2017-06-30 MED ORDER — FUROSEMIDE 10 MG/ML IJ SOLN
40.0000 mg | INTRAMUSCULAR | Status: AC
  Administered 2017-06-30: 40 mg via INTRAVENOUS
  Filled 2017-06-30: qty 4

## 2017-06-30 MED ORDER — NITROGLYCERIN IN D5W 200-5 MCG/ML-% IV SOLN
0.0000 ug/min | Freq: Once | INTRAVENOUS | Status: AC
  Administered 2017-06-30: 5 ug/min via INTRAVENOUS
  Filled 2017-06-30: qty 250

## 2017-06-30 MED ORDER — NITROGLYCERIN 0.4 MG SL SUBL
SUBLINGUAL_TABLET | SUBLINGUAL | Status: AC
  Administered 2017-06-30: 0.4 mg via SUBLINGUAL
  Filled 2017-06-30: qty 3

## 2017-06-30 NOTE — ED Provider Notes (Signed)
[facility] Emergency Department Provider Note   ____________________________________________   First MD Initiated Contact with Patient 06/30/17 2255     (approximate)  I have reviewed the triage vital signs and the nursing notes.   HISTORY  Chief Complaint Shortness of Breath  EM caveat: Limited by severe shortness of breath  HPI [name] is a 71 y.o. male presents for shortness of breath. Reports increasing shortness of breath at home. Worse when laying down this evening. To connection dose of Lasix at home as this feels similar to prior congestive heart failure.  No chest pain No fever No nausea or vomiting   Past Medical History:  Diagnosis Date  . Anemia   . Cancer ([place]) 12/2013   prostate  . Cardiogenic pulmonary edema ([place]) 12/19/2014  . Cardiomyopathy, ischemic   . CHF (congestive heart failure) ([place])   . COPD (chronic obstructive pulmonary disease) ([place])   . Diabetes mellitus without complication ([place])   . GERD (gastroesophageal reflux disease)   . Hypercholesteremia   . Hypertension   . Myocardial infarction ([place]) [id number]  . Shortness of breath dyspnea     Patient Active Problem List   Diagnosis Date Noted  . Acute on chronic systolic CHF (congestive heart failure) ([place]) 04/21/2017  . Acute on chronic respiratory failure with hypoxia ([place]) 04/21/2017  . Hypokalemia 04/21/2017  . Hypomagnesemia 04/21/2017  . Essential hypertension 04/21/2017  . Hyperlipidemia 04/21/2017  . Coronary artery disease 04/21/2017  . CHF (congestive heart failure) ([place]) 04/20/2017  . HTN (hypertension) 02/13/2016  . COPD with chronic bronchitis ([place]) 02/13/2016  . Obstructive sleep apnea 02/13/2016  . Diabetes ([place]) 02/13/2016  . Chronic systolic HF (heart failure) ([place]) 12/12/2015    Past Surgical History:  Procedure Laterality Date  . CARDIAC CATHETERIZATION N/A 02/02/2016   Procedure: Left Heart Cath and Coronary Angiography;   Surgeon: [name], MD;  Location: [place] [facility];  Service: Cardiovascular;  Laterality: N/A;  . CORONARY ANGIOPLASTY WITH STENT PLACEMENT    . CORONARY ARTERY BYPASS GRAFT  11/24/2010  . IMPLANTABLE CARDIOVERTER DEFIBRILLATOR (ICD) GENERATOR CHANGE Left 12/12/2015   Procedure: DUAL LEAD PLACEMENT CARDIAC DIFIBRILLATOR;  Surgeon: [name], MD;  Location: [facility] ORS;  Service: Cardiovascular;  Laterality: Left;  . TONSILLECTOMY      Prior to Admission medications   Medication Sig Start Date End Date Taking? Authorizing Provider  acetaminophen (TYLENOL) 325 MG tablet Take 2 tablets (650 mg total) by mouth every 6 (six) hours as needed for mild pain (or Fever >/= 101). 02/02/16   Gouru, Illene Silver, MD  albuterol (PROVENTIL HFA;VENTOLIN HFA) 108 (90 Base) MCG/ACT inhaler Inhale 2 puffs into the lungs 4 (four) times daily as needed for wheezing or shortness of breath.     [provider]  albuterol-ipratropium (COMBIVENT) 18-103 MCG/ACT inhaler Inhale 1 puff into the lungs 2 times daily at 12 noon and 4 pm.    [provider]  aspirin 81 MG tablet Take 81 mg by mouth every morning.    [provider]  atorvastatin (LIPITOR) 80 MG tablet Take 1 tablet (80 mg total) by mouth every evening. 04/21/17   Theodoro [name], MD  budesonide-formoterol (SYMBICORT) 80-4.5 MCG/ACT inhaler Inhale 2 puffs into the lungs 2 (two) times daily.    [provider]  calcium carbonate (TUMS - DOSED IN MG ELEMENTAL CALCIUM) 500 MG chewable tablet Chew 1 tablet by mouth as needed for indigestion or heartburn.    [provider]  carvedilol (COREG) 6.25 MG tablet Take 6.25 mg by mouth 2 (two) times daily with a meal.    [provider]  citalopram (CELEXA) 20 MG tablet Take 20 mg by mouth 2 (two) times daily.    [provider]  clopidogrel (PLAVIX) 75 MG tablet Take 75 mg by mouth every morning.    [provider]  ferrous sulfate 325 (65 FE) MG  tablet Take 325 mg by mouth 2 (two) times daily with a meal.    [provider]  finasteride (PROSCAR) 5 MG tablet Take 5 mg by mouth at bedtime.    [provider]  FLUoxetine (PROZAC) 20 MG capsule Take 20 mg by mouth daily.    [provider]  furosemide (LASIX) 40 MG tablet Take 40 mg by mouth daily.    [provider]  gabapentin (NEURONTIN) 300 MG capsule Take 600 mg by mouth 3 (three) times daily.     [provider]  insulin glargine (LANTUS) 100 UNIT/ML injection Inject 100 Units into the skin at bedtime.     [provider]  insulin lispro (HUMALOG) 100 UNIT/ML KiwkPen Inject 8-18 Units into the skin 3 (three) times daily with meals. Per sliding scale    [provider]  isosorbide mononitrate (IMDUR) 60 MG 24 hr tablet Take 60 mg by mouth daily.    [provider]  lansoprazole (PREVACID) 15 MG capsule Take 30 mg by mouth 2 (two) times daily.    [provider]  lisinopril (PRINIVIL,ZESTRIL) 20 MG tablet Take 20 mg by mouth daily.     [provider]  loratadine (CLARITIN) 10 MG tablet Take 10 mg by mouth daily.    [provider]  Multiple Vitamins-Minerals (CENTRUM SILVER PO) Take 1 tablet by mouth every morning.    [provider]  nitroGLYCERIN (NITROSTAT) 0.4 MG SL tablet Place 0.4 mg under the tongue every 5 (five) minutes as needed for chest pain.    [provider]  pantoprazole (PROTONIX) 40 MG tablet Take 40 mg by mouth 2 (two) times daily.    [provider]  potassium chloride (K-DUR) 10 MEQ tablet Take 1 tablet (10 mEq total) by mouth daily. 04/21/17   Theodoro [name], MD  ranolazine (RANEXA) 500 MG 12 hr tablet Take 500 mg by mouth 2 (two) times daily.    [provider]  spironolactone (ALDACTONE) 25 MG tablet Take 12.5 mg by mouth daily.    [provider]  sucralfate (CARAFATE) 1 g tablet Take 1 g by mouth 2 (two) times daily.     [provider]  tamsulosin (FLOMAX) 0.4 MG CAPS capsule Take 0.4 mg by mouth daily after supper.    [provider]  torsemide (DEMADEX) 20 MG tablet Take 40 mg by mouth daily. May take 1-2 tablets as needed for fluid    [provider]  Ubiquinol 100 MG CAPS Take by mouth. daily    [provider]    Allergies Benadryl [diphenhydramine]; Doxycycline; and Lopid [gemfibrozil]  Family History  Problem Relation Age of Onset  . CAD Mother   . Cancer Mother   . Diabetes Mother   . Alzheimer's disease Father   . Cancer Father   . Heart disease Father     Social History Social History  Substance Use Topics  . Smoking status: Former Smoker    Packs/day: 2.00    Years: 30.00    Quit date: 12/03/1996  . Smokeless  tobacco: Never Used  . Alcohol use No    Review of Systems - EM caveat Constitutional: No fever/chills  Cardiovascular: Denies chest pain. Respiratory: See history of present illness Gastrointestinal: No abdominal pain.  No nausea, no vomiting.  No diarrhea.  No constipation. Musculoskeletal: No leg swelling. Wife reports his legs never swell. Skin: Feels cool Neurological: Negative for headaches. No weakness    ____________________________________________   PHYSICAL EXAM:  VITAL SIGNS: ED Triage Vitals  Enc Vitals Group     BP 06/30/17 2250 (!) 156/97     Pulse Rate 06/30/17 2250 (!) 133     Resp 06/30/17 2250 (!) 33     Temp 06/30/17 2250 99.3 F (37.4 C)     Temp Source 06/30/17 2250 Axillary     SpO2 06/30/17 2250 100 %     Weight 06/30/17 2248 245 lb (111.1 kg)     Height 06/30/17 2248 5\' 8"  (1.727 m)     Head Circumference --      Peak Flow --      Pain Score 06/30/17 2247 0     Pain Loc --      Pain Edu? --      Excl. in [place]? --     Constitutional: Alert and oriented. Sitting upright in extremis with difficulty breathing obvious Eyes: Conjunctivae are normal. Head: Atraumatic. Nose: No  congestion/rhinnorhea. Mouth/Throat: Mucous membranes are moist. Neck: No stridor.   Cardiovascular: Tachycardic rate, regular rhythm. Grossly normal heart sounds.  Good peripheral circulation. Respiratory: Tachypnea, severe increased work of breathing, taking adequate respirations but appears to be significant shortness of breath. Rales throughout bilateral without wheezing Gastrointestinal: Soft and nontender. No distention. Musculoskeletal: No lower extremity tenderness nor edema. Neurologic:  Normal speech and language. No gross focal neurologic deficits are appreciated.  Skin:  Skin is cool, dry and intact. No rash noted. Psychiatric: Mood and affect are anxious but very pleasant  ____________________________________________   LABS (all labs ordered are listed, but only abnormal results are displayed)  Labs Reviewed  BASIC METABOLIC PANEL - Abnormal; Notable for the following:       Result Value   Glucose, Bld 160 (*)    Creatinine, Ser 1.55 (*)    Calcium 8.5 (*)    GFR calc non Af Amer 43 (*)    GFR calc Af Amer 50 (*)    All other components within normal limits  BLOOD GAS, VENOUS - Abnormal; Notable for the following:    pO2, Ven 51.0 (*)    All other components within normal limits  TROPONIN I  BRAIN NATRIURETIC PEPTIDE  CBC   ____________________________________________  EKG  Reviewed injury by me at 2250 Heart rate 135 QRS 116 QTc 600 Occasional ventricular pacing, left bundle branch block, likely sinus tachycardia, intermittent pacing with a left bundle branch block without obvious ischemic change ____________________________________________  RADIOLOGY  Dg Chest Portable 1 View  Result Date: 06/30/2017 CLINICAL DATA:  Shortness of breath. History of CHF. Rales. Labored breathing. Tachycardia. EXAM: PORTABLE CHEST 1 VIEW COMPARISON:  04/19/2017 FINDINGS: Cardiac pacemaker. Postoperative changes in the mediastinum. Cardiac enlargement with mild vascular  congestion. Interstitial parenchymal pattern likely represents edema. No definite pleural effusions. No pneumothorax. No focal consolidation. IMPRESSION: Cardiac enlargement with mild pulmonary vascular congestion and diffuse interstitial edema. Electronically Signed   By: Lucienne Capers M.D.   On: 06/30/2017 23:13    ____________________________________________   PROCEDURES  Procedure(s) performed: None  Procedures  Critical Care performed: Yes, see critical  care note(s) CRITICAL CARE Performed by: Delman Kitten   Total critical care time: 40 minutes  Critical care time was exclusive of separately billable procedures and treating other patients.  Critical care was necessary to treat or prevent imminent or life-threatening deterioration.  Critical care was time spent personally by me on the following activities: development of treatment plan with patient and/or surrogate as well as nursing, discussions with consultants, evaluation of patient's response to treatment, examination of patient, obtaining history from patient or surrogate, ordering and performing treatments and interventions, ordering and review of laboratory studies, ordering and review of radiographic studies, pulse oximetry and re-evaluation of patient's condition.  ____________________________________________   INITIAL IMPRESSION / ASSESSMENT AND PLAN / ED COURSE  Pertinent labs & imaging results that were available during my care of the patient were reviewed by me and considered in my medical decision making (see chart for details).  Patient presents for sudden onset severe shortness of breath. Bilateral rales. Significant increased work of breathing with tripoding. Respiratory distress.  Clinical Course as of Jun 30 2336  Thu Jun 30, 2017  2315 Patient reports feeling much better. Appears to be breathing much better, work of breathing much improved. Nitro drip initiated titrating based on pressure and work of  breathing  [MQ]    Clinical Course User Index [MQ] Delman Kitten, MD    ----------------------------------------- 11:34 PM on 06/30/2017 -----------------------------------------  Patient respiratory and comfortably on BiPAP now. He reports feeling much improved. His work of breathing appears much better. His blood pressure is improved, work of breathing much better. Chest x-ray and previous history. Consistent with acute decompensated heart failure and pulmonary edema. ____________________________________________   FINAL CLINICAL IMPRESSION(S) / ED DIAGNOSES  Final diagnoses:  Flash pulmonary edema (HCC)  Acute decompensated heart failure (HCC)      NEW MEDICATIONS STARTED DURING THIS VISIT:  New Prescriptions   No medications on file     Note:  This document was prepared using Dragon voice recognition software and may include unintentional dictation errors.     Delman Kitten, MD 06/30/17 2337

## 2017-06-30 NOTE — ED Notes (Signed)
Bipap in place, [id number]%, IPAP 16 [id number]

## 2017-06-30 NOTE — ED Triage Notes (Addendum)
Pt to rm 4 immediately from triage, NRB in place, report SOB, hx CHF, rales upon auscultation, pt breathing very labored, pt tachy 131, reports bipap in past.

## 2017-07-01 DIAGNOSIS — J9621 Acute and chronic respiratory failure with hypoxia: Secondary | ICD-10-CM | POA: Diagnosis present

## 2017-07-01 DIAGNOSIS — Z7982 Long term (current) use of aspirin: Secondary | ICD-10-CM | POA: Diagnosis not present

## 2017-07-01 DIAGNOSIS — Z79899 Other long term (current) drug therapy: Secondary | ICD-10-CM | POA: Diagnosis not present

## 2017-07-01 DIAGNOSIS — J449 Chronic obstructive pulmonary disease, unspecified: Secondary | ICD-10-CM | POA: Diagnosis present

## 2017-07-01 DIAGNOSIS — G4733 Obstructive sleep apnea (adult) (pediatric): Secondary | ICD-10-CM | POA: Diagnosis present

## 2017-07-01 DIAGNOSIS — E7800 Pure hypercholesterolemia, unspecified: Secondary | ICD-10-CM | POA: Diagnosis present

## 2017-07-01 DIAGNOSIS — J810 Acute pulmonary edema: Secondary | ICD-10-CM | POA: Diagnosis present

## 2017-07-01 DIAGNOSIS — I110 Hypertensive heart disease with heart failure: Secondary | ICD-10-CM | POA: Diagnosis present

## 2017-07-01 DIAGNOSIS — Z7951 Long term (current) use of inhaled steroids: Secondary | ICD-10-CM | POA: Diagnosis not present

## 2017-07-01 DIAGNOSIS — Z951 Presence of aortocoronary bypass graft: Secondary | ICD-10-CM | POA: Diagnosis not present

## 2017-07-01 DIAGNOSIS — Z7902 Long term (current) use of antithrombotics/antiplatelets: Secondary | ICD-10-CM | POA: Diagnosis not present

## 2017-07-01 DIAGNOSIS — Z95810 Presence of automatic (implantable) cardiac defibrillator: Secondary | ICD-10-CM | POA: Diagnosis not present

## 2017-07-01 DIAGNOSIS — Z955 Presence of coronary angioplasty implant and graft: Secondary | ICD-10-CM | POA: Diagnosis not present

## 2017-07-01 DIAGNOSIS — I2510 Atherosclerotic heart disease of native coronary artery without angina pectoris: Secondary | ICD-10-CM | POA: Diagnosis present

## 2017-07-01 DIAGNOSIS — Z87891 Personal history of nicotine dependence: Secondary | ICD-10-CM | POA: Diagnosis not present

## 2017-07-01 DIAGNOSIS — I420 Dilated cardiomyopathy: Secondary | ICD-10-CM | POA: Diagnosis present

## 2017-07-01 DIAGNOSIS — E119 Type 2 diabetes mellitus without complications: Secondary | ICD-10-CM | POA: Diagnosis present

## 2017-07-01 DIAGNOSIS — I5023 Acute on chronic systolic (congestive) heart failure: Secondary | ICD-10-CM | POA: Diagnosis present

## 2017-07-01 DIAGNOSIS — Z794 Long term (current) use of insulin: Secondary | ICD-10-CM | POA: Diagnosis not present

## 2017-07-01 DIAGNOSIS — K219 Gastro-esophageal reflux disease without esophagitis: Secondary | ICD-10-CM | POA: Diagnosis present

## 2017-07-01 DIAGNOSIS — E785 Hyperlipidemia, unspecified: Secondary | ICD-10-CM | POA: Diagnosis present

## 2017-07-01 DIAGNOSIS — I252 Old myocardial infarction: Secondary | ICD-10-CM | POA: Diagnosis not present

## 2017-07-01 DIAGNOSIS — Z833 Family history of diabetes mellitus: Secondary | ICD-10-CM | POA: Diagnosis not present

## 2017-07-01 DIAGNOSIS — Z8249 Family history of ischemic heart disease and other diseases of the circulatory system: Secondary | ICD-10-CM | POA: Diagnosis not present

## 2017-07-01 LAB — CBC
HCT: 28.3 % — ABNORMAL LOW (ref 40.0–52.0)
Hemoglobin: 10 g/dL — ABNORMAL LOW (ref 13.0–18.0)
MCH: 31.5 pg (ref 26.0–34.0)
MCHC: 35.3 g/dL (ref 32.0–36.0)
MCV: 89.1 fL (ref 80.0–100.0)
PLATELETS: 161 10*3/uL (ref 150–440)
RBC: 3.18 MIL/uL — ABNORMAL LOW (ref 4.40–5.90)
RDW: 14.2 % (ref 11.5–14.5)
WBC: 9.9 10*3/uL (ref 3.8–10.6)

## 2017-07-01 LAB — GLUCOSE, CAPILLARY
GLUCOSE-CAPILLARY: 84 mg/dL (ref 65–99)
Glucose-Capillary: 119 mg/dL — ABNORMAL HIGH (ref 65–99)
Glucose-Capillary: 65 mg/dL (ref 65–99)
Glucose-Capillary: 102 mg/dL — ABNORMAL HIGH (ref 65–99)
Glucose-Capillary: 157 mg/dL — ABNORMAL HIGH (ref 65–99)
Glucose-Capillary: 123 mg/dL — ABNORMAL HIGH (ref 65–99)

## 2017-07-01 LAB — BASIC METABOLIC PANEL
Anion gap: 10 (ref 5–15)
BUN: 18 mg/dL (ref 6–20)
CHLORIDE: 106 mmol/L (ref 101–111)
CO2: 25 mmol/L (ref 22–32)
CREATININE: 1.28 mg/dL — AB (ref 0.61–1.24)
Calcium: 8 mg/dL — ABNORMAL LOW (ref 8.9–10.3)
GFR calc Af Amer: >60 mL/min (ref >60)
GFR calc non Af Amer: 54 mL/min — ABNORMAL LOW (ref >60)
Glucose, Bld: 79 mg/dL (ref 65–99)
Potassium: 3.9 mmol/L (ref 3.5–5.1)
Sodium: 141 mmol/L (ref 135–145)

## 2017-07-01 LAB — BRAIN NATRIURETIC PEPTIDE: B NATRIURETIC PEPTIDE 5: 620 pg/mL — AB (ref 0.0–100.0)

## 2017-07-01 LAB — MRSA PCR SCREENING: MRSA by PCR: NEGATIVE

## 2017-07-01 MED ORDER — SODIUM CHLORIDE 0.9 % IV SOLN
1.0000 g | Freq: Once | INTRAVENOUS | Status: AC
  Administered 2017-07-01: 1 g via INTRAVENOUS
  Filled 2017-07-01: qty 10

## 2017-07-01 MED ORDER — ENOXAPARIN SODIUM 40 MG/0.4ML ~~LOC~~ SOLN
40.0000 mg | SUBCUTANEOUS | Status: DC
  Administered 2017-07-01: 40 mg via SUBCUTANEOUS
  Filled 2017-07-01: qty 0.4

## 2017-07-01 MED ORDER — ONDANSETRON HCL 4 MG PO TABS: 4.0000 mg | ORAL_TABLET | Freq: Four times a day (QID) | ORAL | Status: DC | PRN

## 2017-07-01 MED ORDER — PANTOPRAZOLE SODIUM 40 MG PO TBEC
40.0000 mg | DELAYED_RELEASE_TABLET | Freq: Two times a day (BID) | ORAL | Status: DC
  Administered 2017-07-01 – 2017-07-02 (×3): 40 mg via ORAL
  Filled 2017-07-01 (×3): qty 1

## 2017-07-01 MED ORDER — FUROSEMIDE 10 MG/ML IJ SOLN: 40.0000 mg | INTRAMUSCULAR | Status: DC

## 2017-07-01 MED ORDER — SODIUM CHLORIDE 0.9 % IV BOLUS (SEPSIS)
250.0000 mL | Freq: Once | INTRAVENOUS | Status: AC
  Administered 2017-07-01: 250 mL via INTRAVENOUS

## 2017-07-01 MED ORDER — CARVEDILOL 6.25 MG PO TABS
6.2500 mg | ORAL_TABLET | Freq: Two times a day (BID) | ORAL | Status: DC
  Administered 2017-07-01 – 2017-07-02 (×2): 6.25 mg via ORAL
  Filled 2017-07-01 (×2): qty 1

## 2017-07-01 MED ORDER — TORSEMIDE 20 MG PO TABS
40.0000 mg | ORAL_TABLET | Freq: Every day | ORAL | Status: DC
  Administered 2017-07-02: 40 mg via ORAL
  Filled 2017-07-01: qty 2

## 2017-07-01 MED ORDER — PANTOPRAZOLE SODIUM 20 MG PO TBEC: 20.0000 mg | DELAYED_RELEASE_TABLET | Freq: Every day | ORAL | Status: DC

## 2017-07-01 MED ORDER — ACETAMINOPHEN 325 MG PO TABS
650.0000 mg | ORAL_TABLET | Freq: Four times a day (QID) | ORAL | Status: DC | PRN
  Administered 2017-07-01: 650 mg via ORAL
  Filled 2017-07-01: qty 2

## 2017-07-01 MED ORDER — LISINOPRIL 20 MG PO TABS
20.0000 mg | ORAL_TABLET | Freq: Every day | ORAL | Status: DC
  Administered 2017-07-01: 20 mg via ORAL
  Filled 2017-07-01 (×2): qty 1

## 2017-07-01 MED ORDER — ISOSORBIDE MONONITRATE ER 60 MG PO TB24
60.0000 mg | ORAL_TABLET | Freq: Every day | ORAL | Status: DC
  Administered 2017-07-01 – 2017-07-02 (×2): 60 mg via ORAL
  Filled 2017-07-01: qty 2
  Filled 2017-07-01: qty 1

## 2017-07-01 MED ORDER — FINASTERIDE 5 MG PO TABS
5.0000 mg | ORAL_TABLET | Freq: Every day | ORAL | Status: DC
  Administered 2017-07-01: 5 mg via ORAL
  Filled 2017-07-01: qty 1

## 2017-07-01 MED ORDER — ATORVASTATIN CALCIUM 20 MG PO TABS
80.0000 mg | ORAL_TABLET | Freq: Every evening | ORAL | Status: DC
  Administered 2017-07-01: 80 mg via ORAL
  Filled 2017-07-01: qty 4

## 2017-07-01 MED ORDER — CHLORHEXIDINE GLUCONATE 0.12 % MT SOLN
15.0000 mL | Freq: Two times a day (BID) | OROMUCOSAL | Status: DC
  Administered 2017-07-01: 15 mL via OROMUCOSAL
  Filled 2017-07-01 (×2): qty 15

## 2017-07-01 MED ORDER — MOMETASONE FURO-FORMOTEROL FUM 100-5 MCG/ACT IN AERO
2.0000 | INHALATION_SPRAY | Freq: Two times a day (BID) | RESPIRATORY_TRACT | Status: DC
  Administered 2017-07-01 – 2017-07-02 (×3): 2 via RESPIRATORY_TRACT
  Filled 2017-07-01: qty 8.8

## 2017-07-01 MED ORDER — CITALOPRAM HYDROBROMIDE 20 MG PO TABS
20.0000 mg | ORAL_TABLET | Freq: Two times a day (BID) | ORAL | Status: DC
  Administered 2017-07-01: 20 mg via ORAL
  Filled 2017-07-01: qty 1

## 2017-07-01 MED ORDER — ASPIRIN 81 MG PO CHEW
81.0000 mg | CHEWABLE_TABLET | ORAL | Status: DC
  Administered 2017-07-01 – 2017-07-02 (×2): 81 mg via ORAL
  Filled 2017-07-01 (×2): qty 1

## 2017-07-01 MED ORDER — ONDANSETRON HCL 4 MG/2ML IJ SOLN: 4.0000 mg | Freq: Four times a day (QID) | INTRAMUSCULAR | Status: DC | PRN

## 2017-07-01 MED ORDER — GABAPENTIN 300 MG PO CAPS
600.0000 mg | ORAL_CAPSULE | Freq: Three times a day (TID) | ORAL | Status: DC
  Administered 2017-07-01 – 2017-07-02 (×4): 600 mg via ORAL
  Filled 2017-07-01 (×4): qty 2

## 2017-07-01 MED ORDER — INSULIN ASPART 100 UNIT/ML ~~LOC~~ SOLN
0.0000 [IU] | Freq: Three times a day (TID) | SUBCUTANEOUS | Status: DC
  Administered 2017-07-02: 2 [IU] via SUBCUTANEOUS
  Filled 2017-07-01: qty 1

## 2017-07-01 MED ORDER — TAMSULOSIN HCL 0.4 MG PO CAPS
0.4000 mg | ORAL_CAPSULE | Freq: Every day | ORAL | Status: DC
  Administered 2017-07-01: 0.4 mg via ORAL
  Filled 2017-07-01: qty 1

## 2017-07-01 MED ORDER — CLOPIDOGREL BISULFATE 75 MG PO TABS
75.0000 mg | ORAL_TABLET | ORAL | Status: DC
  Administered 2017-07-01 – 2017-07-02 (×2): 75 mg via ORAL
  Filled 2017-07-01 (×2): qty 1

## 2017-07-01 MED ORDER — FLUOXETINE HCL 20 MG PO CAPS
20.0000 mg | ORAL_CAPSULE | Freq: Every day | ORAL | Status: DC
  Administered 2017-07-01 – 2017-07-02 (×2): 20 mg via ORAL
  Filled 2017-07-01 (×2): qty 1

## 2017-07-01 MED ORDER — ACETAMINOPHEN 650 MG RE SUPP: 650.0000 mg | Freq: Four times a day (QID) | RECTAL | Status: DC | PRN

## 2017-07-01 MED ORDER — SPIRONOLACTONE 25 MG PO TABS
12.5000 mg | ORAL_TABLET | Freq: Every day | ORAL | Status: DC
  Administered 2017-07-01 – 2017-07-02 (×2): 12.5 mg via ORAL
  Filled 2017-07-01 (×2): qty 1

## 2017-07-01 MED ORDER — INSULIN ASPART 100 UNIT/ML ~~LOC~~ SOLN: 0.0000 [IU] | Freq: Every day | SUBCUTANEOUS | Status: DC

## 2017-07-01 MED ORDER — CARVEDILOL 6.25 MG PO TABS
6.2500 mg | ORAL_TABLET | Freq: Two times a day (BID) | ORAL | Status: DC
  Administered 2017-07-01: 6.25 mg via ORAL
  Filled 2017-07-01: qty 1

## 2017-07-01 MED ORDER — RANOLAZINE ER 500 MG PO TB12
500.0000 mg | ORAL_TABLET | Freq: Two times a day (BID) | ORAL | Status: DC
  Administered 2017-07-01 – 2017-07-02 (×3): 500 mg via ORAL
  Filled 2017-07-01 (×5): qty 1

## 2017-07-01 MED ORDER — TORSEMIDE 20 MG PO TABS: 40.0000 mg | ORAL_TABLET | Freq: Every day | ORAL | Status: DC

## 2017-07-01 MED ORDER — ORAL CARE MOUTH RINSE: 15.0000 mL | Freq: Two times a day (BID) | OROMUCOSAL | Status: DC

## 2017-07-01 MED ORDER — FUROSEMIDE 10 MG/ML IJ SOLN
20.0000 mg | Freq: Two times a day (BID) | INTRAMUSCULAR | Status: AC
  Administered 2017-07-01 (×2): 20 mg via INTRAVENOUS
  Filled 2017-07-01 (×2): qty 2

## 2017-07-01 NOTE — Consult Note (Signed)
Name: [name] MRN: [id number] DOB: 18-Jan-1946    ADMISSION DATE:  06/30/2017  CONSULTATION DATE:  07/01/17  REFERRING MD :  Dr. Jannifer Franklin  CHIEF COMPLAINT:  Shortness Of Breath  BRIEF PATIENT DESCRIPTION: 71 year old male with acute on chronic respiratory failure secondary to CHF Exacerbation Vs Pulmonary edema  SIGNIFICANT EVENTS  7/27 Patient admitted to the stepdown unit with respiratory failure requiring BiPAP  STUDIES:  04/20/17 ECHO>>The cavity size was severely dilated. Systolic  function was severely reduced. The estimated ejection fraction  was in the range of 20% to 25%.  HISTORY OF PRESENT ILLNESS:  [name] is a 71 year old male with known history of Ischemic cardiomyopathy,MI (1990,1994,1997),CHF,COPD,DM,GERD,Hypertension, Hyperlipidemia and AICD implantation (12/12/15). Patient presented to ED with severe shortness of breath He reports that his dyspnea is worse when laying down. CXR was concerning for pulmonary edema Vs CHF exacerbation.  Patient was placed on BiPAP and admitted to the SDU.  PAST MEDICAL HISTORY :   has a past medical history of Anemia; Cancer ([place]) (12/2013); Cardiogenic pulmonary edema ([place]) (12/19/2014); Cardiomyopathy, ischemic; CHF (congestive heart failure) ([place]); COPD (chronic obstructive pulmonary disease) ([place]); Diabetes mellitus without complication ([place]); GERD (gastroesophageal reflux disease); Hypercholesteremia; Hypertension; Myocardial infarction [facility]) ([id number]); and Shortness of breath dyspnea.  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 Prior to Admission medications   Medication Sig Start Date End Date Taking? Authorizing Provider  acetaminophen (TYLENOL) 325 MG tablet Take 2 tablets (650 mg total) by mouth every 6  (six) hours as needed for mild pain (or Fever >/= 101). 02/02/16   Gouru, Illene Silver, MD  albuterol (PROVENTIL HFA;VENTOLIN HFA) 108 (90 Base) MCG/ACT inhaler Inhale 2 puffs into the lungs 4 (four) times daily as needed for wheezing or shortness of breath.     [provider]  albuterol-ipratropium (COMBIVENT) 18-103 MCG/ACT inhaler Inhale 1 puff into the lungs 2 times daily at 12 noon and 4 pm.    [provider]  aspirin 81 MG tablet Take 81 mg by mouth every morning.    [provider]  atorvastatin (LIPITOR) 80 MG tablet Take 1 tablet (80 mg total) by mouth every evening. 04/21/17   Theodoro [name], MD  budesonide-formoterol (SYMBICORT) 80-4.5 MCG/ACT inhaler Inhale 2 puffs into the lungs 2 (two) times daily.    [provider]  calcium carbonate (TUMS - DOSED IN MG ELEMENTAL CALCIUM) 500 MG chewable tablet Chew 1 tablet by mouth as needed for indigestion or heartburn.    [provider]  carvedilol (COREG) 6.25 MG tablet Take 6.25 mg by mouth 2 (two) times daily with a meal.    [provider]  citalopram (CELEXA) 20 MG tablet Take 20 mg by mouth 2 (two) times daily.    [provider]  clopidogrel (PLAVIX) 75 MG tablet Take 75 mg by mouth every morning.    [provider]  ferrous sulfate 325 (65 FE) MG tablet Take 325 mg by mouth 2 (two) times daily with a meal.    [provider]  finasteride (PROSCAR) 5 MG tablet Take 5 mg by mouth at bedtime.    [provider]  FLUoxetine (PROZAC) 20 MG capsule Take 20 mg by mouth daily.    [provider]  furosemide (LASIX) 40 MG tablet Take 40 mg by mouth daily.    [provider]  gabapentin (NEURONTIN) 300 MG capsule Take 600 mg by mouth 3 (three) times daily.     [provider]  insulin glargine (LANTUS) 100 UNIT/ML injection Inject 100 Units into the skin at bedtime.     [provider]  insulin lispro (HUMALOG) 100 UNIT/ML KiwkPen  Inject 8-18 Units into the skin 3 (three) times daily with meals. Per sliding scale    [provider]  isosorbide mononitrate (IMDUR) 60 MG 24 hr tablet Take 60 mg by mouth daily.    [provider]  lansoprazole (PREVACID) 15 MG capsule Take 30 mg by mouth 2 (two) times daily.    [provider]  lisinopril (PRINIVIL,ZESTRIL) 20 MG tablet Take 20 mg by mouth daily.     [provider]  loratadine (CLARITIN) 10 MG tablet Take 10 mg by mouth daily.    [provider]  Multiple Vitamins-Minerals (CENTRUM SILVER PO) Take 1 tablet by mouth every morning.    [provider]  nitroGLYCERIN (NITROSTAT) 0.4 MG SL tablet Place 0.4 mg under the tongue every 5 (five) minutes as needed for chest pain.    [provider]  pantoprazole (PROTONIX) 40 MG tablet Take 40 mg by mouth 2 (two) times daily.    [provider]  potassium chloride (K-DUR) 10 MEQ tablet Take 1 tablet (10 mEq total) by mouth daily. 04/21/17   Theodoro [name], MD  ranolazine (RANEXA) 500 MG 12 hr tablet Take 500 mg by mouth 2 (two) times daily.    [provider]  spironolactone (ALDACTONE) 25 MG tablet Take 12.5 mg by mouth daily.    [provider]  sucralfate (CARAFATE) 1 g tablet Take 1 g by mouth 2 (two) times daily.    [provider]  tamsulosin (FLOMAX) 0.4 MG CAPS capsule Take 0.4 mg by mouth daily after supper.    [provider]  torsemide (DEMADEX) 20 MG tablet Take 40 mg by mouth daily. May take 1-2 tablets as needed for fluid    [provider]  Ubiquinol 100 MG CAPS Take by mouth. daily    [provider]   Allergies  Allergen Reactions  . Benadryl [Diphenhydramine] Other (See Comments)    " Hyperactivity"  . Doxycycline Swelling    Pt went into pulmonary edema.  . Lopid [Gemfibrozil] Swelling    "I gain 1 pound a day for 30 days."    FAMILY HISTORY:  family history includes Alzheimer's disease  in his father; CAD in his mother; Cancer in his father and mother; Diabetes in his mother; Heart disease in his father. SOCIAL HISTORY:  reports that he quit smoking about 20 years ago. He has a 60.00 pack-year smoking history. He has never used smokeless tobacco. He reports that he does not drink alcohol or use drugs.  REVIEW OF SYSTEMS:   Constitutional: Negative for fever, chills, weight loss, malaise/fatigue and diaphoresis.  HENT: Negative for hearing loss, ear pain, nosebleeds, congestion, sore throat, neck pain, tinnitus and ear discharge.   Eyes: Negative for blurred vision, double vision, photophobia, pain, discharge and redness.  Respiratory: Negative for cough, hemoptysis, sputum production, shortness of breath, wheezing and stridor.   Cardiovascular: Negative for chest pain, palpitations, orthopnea, claudication, leg swelling and PND.  Gastrointestinal: Negative for heartburn, nausea, vomiting, abdominal pain, diarrhea, constipation, blood in stool and melena.  Genitourinary: Negative for dysuria, urgency, frequency, hematuria and flank pain.  Musculoskeletal: Negative for myalgias, back pain, joint pain and falls.  Skin:  Negative for itching and rash.  Neurological: Negative for dizziness, tingling, tremors, sensory change, speech change, focal weakness, seizures, loss of consciousness, weakness and headaches.  Endo/Heme/Allergies: Negative for environmental allergies and polydipsia. Does not bruise/bleed easily.  SUBJECTIVE: Patient has some shortness of breath but states that "feeling better on BiPAP"  VITAL SIGNS: Temp:  [99.3 F (37.4 C)] 99.3 F (37.4 C) (07/26 2250) Pulse Rate:  [90-133] 93 (07/27 0015) Resp:  [24-33] 24 (07/27 0015) BP: (106-156)/(61-97) 106/63 (07/27 0015) SpO2:  [98 %-100 %] 99 % (07/27 0015) Weight:  [111.1 kg (245 lb)] 111.1 kg (245 lb) (07/26 2248)  PHYSICAL EXAMINATION: General:  71 yo gentleman in some distress , on Bipap Neuro:  Awake,Alert  and oriented HEENT:  AT,[place],No JVD Cardiovascular: S1S2,Regular,paced ,no m/r/g  Lungs: diminished bibasilar, no wheezes,crackles,rhonchi Abdomen: obese,round,+BS Musculoskeletal: no edema/cyanosis noted Skin: warm.dry and intact.   Recent Labs Lab 06/30/17 2256  NA 142  K 4.3  CL 105  CO2 23  BUN 17  CREATININE 1.55*  GLUCOSE 160*    Recent Labs Lab 06/30/17 2325  HGB 10.4*  HCT 30.0*  WBC 11.4*  PLT 183   Dg Chest Portable 1 View  Result Date: 06/30/2017 CLINICAL DATA:  Shortness of breath. History of CHF. Rales. Labored breathing. Tachycardia. EXAM: PORTABLE CHEST 1 VIEW COMPARISON:  04/19/2017 FINDINGS: Cardiac pacemaker. Postoperative changes in the mediastinum. Cardiac enlargement with mild vascular congestion. Interstitial parenchymal pattern likely represents edema. No definite pleural effusions. No pneumothorax. No focal consolidation. IMPRESSION: Cardiac enlargement with mild pulmonary vascular congestion and diffuse interstitial edema. Electronically Signed   By: Lucienne Capers M.D.   On: 06/30/2017 23:13    ASSESSMENT / PLAN:  Acute on Chronic Respiratory failure secondary to Pulmonary edema Vs CHF exacerbation CHF  COPD Hypertension Hx of Hyperlipidemia Hx of DM Hx of GERD  Plan Continue BiPAP,Wean as tolerated Continue Aspirin/Plavix Continue Coreg,Imdur,Lisinopril Continue Atorvastatin Lasix 40 mg X1 Continue Torsemide/spirinolactone Cardiology consulted ECHO(04/20/17) EF of 20-25% Continue Protonix Blood glucose checks with SSI coverage. Follow BMET intermittenly   Bincy Varughese,AG-ACNP Pulmonary and [place]   07/01/2017, 12:28 AM   STAFF NOTE: I. Dr. Ashby Dawes, have personally reviewed the patient's available data including medical history , events of notes, physican examination and test results as part of my evaluation. I have discussed with the  Care with the NP and other care providers including   pharmacist, ICU RN, RRT, dietary. I personally reviewed imaging, shows a mild interstitial edema, otherwise unremarkable lungs. Physical Exam  Lungs - Decreased air entry bilaterally.   The patient is a 71 year old male with a history of systolic congestive heart failure, who presents with acute hypoxic respiratory failure, consistent with acute flash pulmonary edema. He was initially treated with BiPAP, he is now improved since being treated with BiPAP and being diuresed. Case was discussed with cardiology. Will diurese with IV Lasix today, switch back to home oral torsemide dose tomorrow. Will restart the patient's baseline CPAP daily at bedtime with 5 L of oxygen bled in.  Marda Stalker, MD.   Board Certified in Internal Medicine, Pulmonary Medicine, [place], and Sleep Medicine.  [facility] Office Number: [phone] Pager: [phone]  Patricia Pesa, M.D.  Merton Border, M.D 07/01/2017

## 2017-07-01 NOTE — H&P (Signed)
[facility] NAME: [name]    MR#:  [id number]  DATE OF BIRTH:  1945-12-11  DATE OF ADMISSION:  06/30/2017  PRIMARY CARE PHYSICIAN: [name], MD   REQUESTING/REFERRING PHYSICIAN: Jacqualine Code, MD  CHIEF COMPLAINT:   Chief Complaint  Patient presents with  . Shortness of Breath    HISTORY OF PRESENT ILLNESS:  [name]  is a 71 y.o. male who presents with Acute onset shortness of breath. Patient states that he was sitting at home in his chair this afternoon when he suddenly became short of breath. He has a history of severe systolic heart failure. He took some extra doses of his diuretic and put his CPAP machine on, which he states helped him for sometime this afternoon. However, this evening he went to lay down and was acutely short of breath again. He came to the ED for evaluation. Here he was found to have significant edema on chest x-ray. His blood pressure was also elevated. He was placed on BiPAP, and a nitro drip with significant improvement in his symptoms. Hospitalists were called for admission.  PAST MEDICAL HISTORY:   Past Medical History:  Diagnosis Date  . Anemia   . Cancer ([place]) 12/2013   prostate  . Cardiogenic pulmonary edema ([place]) 12/19/2014  . Cardiomyopathy, ischemic   . CHF (congestive heart failure) ([place])   . COPD (chronic obstructive pulmonary disease) ([place])   . Diabetes mellitus without complication ([place])   . GERD (gastroesophageal reflux disease)   . Hypercholesteremia   . Hypertension   . Myocardial infarction ([place]) [id number]  . Shortness of breath dyspnea     PAST SURGICAL HISTORY:   Past Surgical History:  Procedure Laterality Date  . CARDIAC CATHETERIZATION N/A 02/02/2016   Procedure: Left Heart Cath and Coronary Angiography;  Surgeon: [name], MD;  Location: [facility];  Service: Cardiovascular;  Laterality: N/A;  . CORONARY ANGIOPLASTY WITH STENT  PLACEMENT    . CORONARY ARTERY BYPASS GRAFT  11/24/2010  . IMPLANTABLE CARDIOVERTER DEFIBRILLATOR (ICD) GENERATOR CHANGE Left 12/12/2015   Procedure: DUAL LEAD PLACEMENT CARDIAC DIFIBRILLATOR;  Surgeon: [name], MD;  Location: [facility];  Service: Cardiovascular;  Laterality: Left;  . TONSILLECTOMY      SOCIAL HISTORY:   Social History  Substance Use Topics  . Smoking status: Former Smoker    Packs/day: 2.00    Years: 30.00    Quit date: 12/03/1996  . Smokeless tobacco: Never Used  . Alcohol use No    FAMILY HISTORY:   Family History  Problem Relation Age of Onset  . CAD Mother   . Cancer Mother   . Diabetes Mother   . Alzheimer's disease Father   . Cancer Father   . Heart disease Father     DRUG ALLERGIES:   Allergies  Allergen Reactions  . Benadryl [Diphenhydramine] Other (See Comments)    " Hyperactivity"  . Doxycycline Swelling    Pt went into pulmonary edema.  . Lopid [Gemfibrozil] Swelling    "I gain 1 pound a day for 30 days."    MEDICATIONS AT HOME:   Prior to Admission medications   Medication Sig Start Date End Date Taking? Authorizing Provider  acetaminophen (TYLENOL) 325 MG tablet Take 2 tablets (650 mg total) by mouth every 6 (six) hours as needed for mild pain (or Fever >/= 101). 02/02/16   Nicholes Mango, MD  albuterol (PROVENTIL HFA;VENTOLIN HFA) 108 (90 Base)  MCG/ACT inhaler Inhale 2 puffs into the lungs 4 (four) times daily as needed for wheezing or shortness of breath.     [provider]  albuterol-ipratropium (COMBIVENT) 18-103 MCG/ACT inhaler Inhale 1 puff into the lungs 2 times daily at 12 noon and 4 pm.    [provider]  aspirin 81 MG tablet Take 81 mg by mouth every morning.    [provider]  atorvastatin (LIPITOR) 80 MG tablet Take 1 tablet (80 mg total) by mouth every evening. 04/21/17   Theodoro [name], MD  budesonide-formoterol (SYMBICORT) 80-4.5 MCG/ACT inhaler Inhale 2 puffs into the lungs 2 (two) times  daily.    [provider]  calcium carbonate (TUMS - DOSED IN MG ELEMENTAL CALCIUM) 500 MG chewable tablet Chew 1 tablet by mouth as needed for indigestion or heartburn.    [provider]  carvedilol (COREG) 6.25 MG tablet Take 6.25 mg by mouth 2 (two) times daily with a meal.    [provider]  citalopram (CELEXA) 20 MG tablet Take 20 mg by mouth 2 (two) times daily.    [provider]  clopidogrel (PLAVIX) 75 MG tablet Take 75 mg by mouth every morning.    [provider]  ferrous sulfate 325 (65 FE) MG tablet Take 325 mg by mouth 2 (two) times daily with a meal.    [provider]  finasteride (PROSCAR) 5 MG tablet Take 5 mg by mouth at bedtime.    [provider]  FLUoxetine (PROZAC) 20 MG capsule Take 20 mg by mouth daily.    [provider]  furosemide (LASIX) 40 MG tablet Take 40 mg by mouth daily.    [provider]  gabapentin (NEURONTIN) 300 MG capsule Take 600 mg by mouth 3 (three) times daily.     [provider]  insulin glargine (LANTUS) 100 UNIT/ML injection Inject 100 Units into the skin at bedtime.     [provider]  insulin lispro (HUMALOG) 100 UNIT/ML KiwkPen Inject 8-18 Units into the skin 3 (three) times daily with meals. Per sliding scale    [provider]  isosorbide mononitrate (IMDUR) 60 MG 24 hr tablet Take 60 mg by mouth daily.    [provider]  lansoprazole (PREVACID) 15 MG capsule Take 30 mg by mouth 2 (two) times daily.    [provider]  lisinopril (PRINIVIL,ZESTRIL) 20 MG tablet Take 20 mg by mouth daily.     [provider]  loratadine (CLARITIN) 10 MG tablet Take 10 mg by mouth daily.    [provider]  Multiple Vitamins-Minerals (CENTRUM SILVER PO) Take 1 tablet by mouth every morning.    [provider]  nitroGLYCERIN (NITROSTAT) 0.4 MG SL tablet Place 0.4 mg under the tongue every 5 (five) minutes as  needed for chest pain.    [provider]  pantoprazole (PROTONIX) 40 MG tablet Take 40 mg by mouth 2 (two) times daily.    [provider]  potassium chloride (K-DUR) 10 MEQ tablet Take 1 tablet (10 mEq total) by mouth daily. 04/21/17   Theodoro [name], MD  ranolazine (RANEXA) 500 MG 12 hr tablet Take 500 mg by mouth 2 (two) times daily.    [provider]  spironolactone (ALDACTONE) 25 MG tablet Take 12.5 mg by mouth daily.    [provider]  sucralfate (CARAFATE) 1 g tablet Take 1 g by mouth 2 (two) times daily.    [provider]  tamsulosin [facility])  0.4 MG CAPS capsule Take 0.4 mg by mouth daily after supper.    [provider]  torsemide (DEMADEX) 20 MG tablet Take 40 mg by mouth daily. May take 1-2 tablets as needed for fluid    [provider]  Ubiquinol 100 MG CAPS Take by mouth. daily    [provider]    REVIEW OF SYSTEMS:  Review of Systems  Constitutional: Negative for chills, fever, malaise/fatigue and weight loss.  HENT: Negative for ear pain, hearing loss and tinnitus.   Eyes: Negative for blurred vision, double vision, pain and redness.  Respiratory: Positive for shortness of breath. Negative for cough and hemoptysis.   Cardiovascular: Negative for chest pain, palpitations, orthopnea and leg swelling.  Gastrointestinal: Negative for abdominal pain, constipation, diarrhea, nausea and vomiting.  Genitourinary: Negative for dysuria, frequency and hematuria.  Musculoskeletal: Negative for back pain, joint pain and neck pain.  Skin:       No acne, rash, or lesions  Neurological: Negative for dizziness, tremors, focal weakness and weakness.  Endo/Heme/Allergies: Negative for polydipsia. Does not bruise/bleed easily.  Psychiatric/Behavioral: Negative for depression. The patient is not nervous/anxious and does not have insomnia.      VITAL SIGNS:   Vitals:   06/30/17 2319 06/30/17 2345 07/01/17 0000  07/01/17 0015  BP: 121/75 106/65 117/61 106/63  Pulse: (!) 104 90 92 93  Resp: (!) 25 (!) 29 (!) 24 (!) 24  Temp:      TempSrc:      SpO2: 98% 98% 100% 99%  Weight:      Height:       Wt Readings from Last 3 Encounters:  06/30/17 111.1 kg (245 lb)  04/21/17 110.4 kg (243 lb 6.4 oz)  01/03/17 107.7 kg (237 lb 6.4 oz)    PHYSICAL EXAMINATION:  Physical Exam  Vitals reviewed. Constitutional: He is oriented to person, place, and time. He appears well-developed and well-nourished. No distress.  HENT:  Head: Normocephalic and atraumatic.  Mouth/Throat: Oropharynx is clear and moist.  Eyes: Pupils are equal, round, and reactive to light. Conjunctivae and EOM are normal. No scleral icterus.  Neck: Normal range of motion. Neck supple. No JVD present. No thyromegaly present.  Cardiovascular: Normal rate, regular rhythm and intact distal pulses.  Exam reveals no gallop and no friction rub.   No murmur heard. Respiratory: Effort normal. No respiratory distress. He has no wheezes. He has rales.  On bipap  GI: Soft. Bowel sounds are normal. He exhibits no distension. There is no tenderness.  Musculoskeletal: Normal range of motion. He exhibits no edema.  No arthritis, no gout  Lymphadenopathy:    He has no cervical adenopathy.  Neurological: He is alert and oriented to person, place, and time. No cranial nerve deficit.  No dysarthria, no aphasia  Skin: Skin is warm and dry. No rash noted. No erythema.  Psychiatric: He has a normal mood and affect. His behavior is normal. Judgment and thought content normal.    LABORATORY PANEL:   CBC  Recent Labs Lab 06/30/17 2325  WBC 11.4*  HGB 10.4*  HCT 30.0*  PLT 183   ------------------------------------------------------------------------------------------------------------------  Chemistries   Recent Labs Lab 06/30/17 2256  NA 142  K 4.3  CL 105  CO2 23  GLUCOSE 160*  BUN 17  CREATININE 1.55*  CALCIUM 8.5*    ------------------------------------------------------------------------------------------------------------------  Cardiac Enzymes  Recent Labs Lab 06/30/17 2256  TROPONINI <0.03   ------------------------------------------------------------------------------------------------------------------  RADIOLOGY:  Dg Chest Portable 1 View  Result Date: 06/30/2017 CLINICAL DATA:  Shortness of breath. History of CHF. Rales. Labored breathing. Tachycardia. EXAM: PORTABLE CHEST 1 VIEW COMPARISON:  04/19/2017 FINDINGS: Cardiac pacemaker. Postoperative changes in the mediastinum. Cardiac enlargement with mild vascular congestion. Interstitial parenchymal pattern likely represents edema. No definite pleural effusions. No pneumothorax. No focal consolidation. IMPRESSION: Cardiac enlargement with mild pulmonary vascular congestion and diffuse interstitial edema. Electronically Signed   By: Lucienne Capers M.D.   On: 06/30/2017 23:13    EKG:   Orders placed or performed during the hospital encounter of 06/30/17  . EKG 12-Lead  . EKG 12-Lead    IMPRESSION AND PLAN:  Principal Problem:   Acute on chronic respiratory failure with hypoxia (HCC) - hypertensive episode with flash pulmonary edema, exacerbated by his acute heart failure. Diuretics given, BiPAP in place, cardiology consult Active Problems:   Acute on chronic systolic CHF (congestive heart failure) ([place]) - see treatment above   Diabetes ([place]) - sliding scale insulin with corresponding glucose checks   Essential hypertension - patient is on nitro drip as above, continue other home meds and wean drip as possible   Coronary artery disease - continue home meds   Obstructive sleep apnea - BiPAP in place as above  All the records are reviewed and case discussed with ED provider. Management plans discussed with the patient and/or family.  DVT PROPHYLAXIS: SubQ lovenox  GI PROPHYLAXIS: PPI  ADMISSION STATUS: Inpatient  CODE STATUS:  Full Code Status History    Date Active Date Inactive Code Status Order ID Comments User Context   04/20/2017  2:32 AM 04/21/2017  6:16 PM Full Code [id number]  Saundra Shelling, MD Inpatient   01/31/2016  1:43 AM 02/02/2016  9:11 PM Full Code [id number]  Hillary Bow, MD ED   12/12/2015  4:27 PM 12/13/2015  3:29 PM Full Code [id number]  [name], MD Inpatient    Advance Directive Documentation     Most Recent Value  Type of Advance Directive  Healthcare Power of [place], Living will  Pre-existing out of facility DNR order (yellow form or pink MOST form)  -  "MOST" Form in Place?  -      TOTAL TIME TAKING CARE OF THIS PATIENT: 45 minutes.   Kella Splinter [place] 07/01/2017, 12:20 AM  [facility]  Office  [phone]  CC: Primary care physician; [name], MD  Note:  This document was prepared using Dragon voice recognition software and may include unintentional dictation errors.

## 2017-07-01 NOTE — Consult Note (Signed)
[facility] Consultation Note  Patient ID: [name], MRN: [id number], DOB/AGE: Dec 01, 1946 71 y.o. Admit date: 06/30/2017   Date of Consult: 07/01/2017 Primary Physician: [name], MD [facility]  Chief Complaint:  Chief Complaint  Patient presents with  . Shortness of Breath   Reason for Consult: acute on chronic systolic dysfunction heart failure  HPI: 71 y.o. male with known coronary artery disease status post coronary artery bypass graft and dilated cardiomyopathy with LV systolic dysfunction on appropriate medication management. The patient has done very well in the last many months with appropriate treatment and management. He is on appropriate high intensity cholesterol therapy dual antiplatelet therapy Ace inhibition Aldactone and diuretics. He has been watching his weight as well as watching his sodium intake. The patient has had some rare episodes 3-4 times per year when he has a typical episode of acute on chronic systolic dysfunction heart failure. Usually starts late in the afternoon for which the patient has some shortness of breath but no evidence of other significant issues. When he lies down to go to bed and put his CPAP machine on he cannot breathe and therefore needs BiPAP and intravenous Lasix. He usually recovers very quickly and never has had any elevated troponin consistent with ischemia or myocardial infarction. Additionally he has had only minimal elevation of his BNP. The patient does have some chronic kidney disease and anemia which may have influenced some of this issue today. He is now back to baseline as before feeling great with clear lungs and no evidence of rhythm disturbances Past Medical History:  Diagnosis Date  . Anemia   . Cancer ([place]) 12/2013   prostate  . Cardiogenic pulmonary edema ([place]) 12/19/2014  . Cardiomyopathy, ischemic   . CHF (congestive heart failure) ([place])   . COPD (chronic obstructive pulmonary  disease) ([place])   . Diabetes mellitus without complication ([place])   . GERD (gastroesophageal reflux disease)   . Hypercholesteremia   . Hypertension   . Myocardial infarction ([place]) [id number]  . Shortness of breath dyspnea       Surgical History:  Past Surgical History:  Procedure Laterality Date  . CARDIAC CATHETERIZATION N/A 02/02/2016   Procedure: Left Heart Cath and Coronary Angiography;  Surgeon: [name], MD;  Location: [facility];  Service: Cardiovascular;  Laterality: N/A;  . CORONARY ANGIOPLASTY WITH STENT PLACEMENT    . CORONARY ARTERY BYPASS GRAFT  11/24/2010  . IMPLANTABLE CARDIOVERTER DEFIBRILLATOR (ICD) GENERATOR CHANGE Left 12/12/2015   Procedure: DUAL LEAD PLACEMENT CARDIAC DIFIBRILLATOR;  Surgeon: [name], MD;  Location: [facility];  Service: Cardiovascular;  Laterality: Left;  . TONSILLECTOMY       Home Meds: Prior to Admission medications   Medication Sig Start Date End Date Taking? Authorizing Provider  acetaminophen (TYLENOL) 325 MG tablet Take 2 tablets (650 mg total) by mouth every 6 (six) hours as needed for mild pain (or Fever >/= 101). 02/02/16  Yes Gouru, Illene Silver, MD  albuterol (PROVENTIL HFA;VENTOLIN HFA) 108 (90 Base) MCG/ACT inhaler Inhale 2 puffs into the lungs 4 (four) times daily as needed for wheezing or shortness of breath.    Yes [provider]  albuterol-ipratropium (COMBIVENT) 18-103 MCG/ACT inhaler Inhale 1 puff into the lungs 2 times daily at 12 noon and 4 pm.   Yes [provider]  atorvastatin (LIPITOR) 80 MG tablet Take 1 tablet (80 mg total) by mouth every evening. 04/21/17  Yes Theodoro [name], MD  budesonide-formoterol (SYMBICORT) 80-4.5  MCG/ACT inhaler Inhale 2 puffs into the lungs 2 (two) times daily.   Yes [provider]  calcium carbonate (TUMS - DOSED IN MG ELEMENTAL CALCIUM) 500 MG chewable tablet Chew 1 tablet by mouth as needed for indigestion or heartburn.   Yes [provider]   carvedilol (COREG) 6.25 MG tablet Take 6.25 mg by mouth 2 (two) times daily with a meal.   Yes [provider]  clopidogrel (PLAVIX) 75 MG tablet Take 75 mg by mouth every morning.   Yes [provider]  co-enzyme Q-10 30 MG capsule Take 30 mg by mouth daily.   Yes [provider]  ferrous sulfate 325 (65 FE) MG tablet Take 325 mg by mouth 2 (two) times daily with a meal.   Yes [provider]  finasteride (PROSCAR) 5 MG tablet Take 5 mg by mouth at bedtime.   Yes [provider]  FLUoxetine (PROZAC) 20 MG capsule Take 20 mg by mouth daily.   Yes [provider]  gabapentin (NEURONTIN) 300 MG capsule Take 300 mg by mouth 2 (two) times daily.    Yes [provider]  insulin glargine (LANTUS) 100 UNIT/ML injection Inject 100 Units into the skin at bedtime.    Yes [provider]  insulin lispro (HUMALOG) 100 UNIT/ML KiwkPen Inject 8-18 Units into the skin 3 (three) times daily with meals. Per sliding scale   Yes [provider]  isosorbide mononitrate (IMDUR) 60 MG 24 hr tablet Take 60 mg by mouth daily.   Yes [provider]  lansoprazole (PREVACID SOLUTAB) 30 MG disintegrating tablet Take 30 mg by mouth 2 (two) times daily.   Yes [provider]  lisinopril (PRINIVIL,ZESTRIL) 20 MG tablet Take 20 mg by mouth daily.    Yes [provider]  loratadine (CLARITIN) 10 MG tablet Take 10 mg by mouth daily.   Yes [provider]  Multiple Vitamins-Minerals (CENTRUM SILVER PO) Take 1 tablet by mouth every morning.   Yes [provider]  nitroGLYCERIN (NITROSTAT) 0.4 MG SL tablet Place 0.4 mg under the tongue every 5 (five) minutes as needed for chest pain.   Yes [provider]  potassium chloride (K-DUR) 10 MEQ tablet Take 1 tablet (10 mEq total) by mouth daily. 04/21/17  Yes Theodoro [name], MD  ranolazine (RANEXA) 500 MG 12 hr tablet Take 500 mg by mouth 2 (two) times  daily.   Yes [provider]  spironolactone (ALDACTONE) 25 MG tablet Take 12.5 mg by mouth daily.   Yes [provider]  sucralfate (CARAFATE) 1 g tablet Take 1 g by mouth 2 (two) times daily.   Yes [provider]  tamsulosin (FLOMAX) 0.4 MG CAPS capsule Take 0.4 mg by mouth daily after supper.   Yes [provider]  torsemide (DEMADEX) 20 MG tablet Take 40 mg by mouth daily. May take 1-2 additional tablets as needed for fluid   Yes [provider]    Inpatient Medications:  . aspirin  81 mg Oral BH-q7a  . atorvastatin  80 mg Oral QPM  . chlorhexidine  15 mL Mouth Rinse BID  . citalopram  20 mg Oral BID  . clopidogrel  75 mg Oral BH-q7a  . enoxaparin (LOVENOX) injection  40 mg Subcutaneous Q24H  . finasteride  5 mg Oral QHS  . FLUoxetine  20 mg Oral Daily  . furosemide  20 mg Intravenous BID  . gabapentin  600 mg Oral TID  . insulin aspart  0-5 Units Subcutaneous QHS  . insulin aspart  0-9 Units Subcutaneous TID WC  . isosorbide mononitrate  60 mg Oral Daily  . lisinopril  20 mg Oral Daily  . mouth rinse  15 mL Mouth Rinse q12n4p  . mometasone-formoterol  2 puff Inhalation BID  . pantoprazole  40 mg Oral BID  . ranolazine  500 mg Oral BID  . spironolactone  12.5 mg Oral Daily  . tamsulosin  0.4 mg Oral QPC supper  . [START ON 07/02/2017] torsemide  40 mg Oral Daily     Allergies:  Allergies  Allergen Reactions  . Benadryl [Diphenhydramine] Other (See Comments)    " Hyperactivity"  . Doxycycline Swelling    Pt went into pulmonary edema.  . Lopid [Gemfibrozil] Swelling    "I gain 1 pound a day for 30 days."    Social History   Social History  . Marital status: Married    Spouse name: N/A  . Number of children: N/A  . Years of education: N/A   Occupational History  . retired    Social History Main Topics  . Smoking status: Former Smoker    Packs/day: 2.00    Years: 30.00    Quit date: 12/03/1996  . Smokeless  tobacco: Never Used  . Alcohol use No  . Drug use: No  . Sexual activity: Not on file   Other Topics Concern  . Not on file   Social History Narrative  . No narrative on file     Family History  Problem Relation Age of Onset  . CAD Mother   . Cancer Mother   . Diabetes Mother   . Alzheimer's disease Father   . Cancer Father   . Heart disease Father      Review of Systems Positive forShortness of breath cough congestion Negative for: General:  chills, fever, night sweats or weight changes.  Cardiovascular: PND orthopnea syncope dizziness  Dermatological skin lesions rashes Respiratory: Positive for Cough congestion Urologic: Frequent urination urination at night and hematuria Abdominal: negative for nausea, vomiting, diarrhea, bright red blood per rectum, melena, or hematemesis Neurologic: negative for visual changes, and/or hearing changes  All other systems reviewed and are otherwise negative except as noted above.  Labs:  Recent Labs  06/30/17 2256  TROPONINI <0.03   Lab Results  Component Value Date   WBC 9.9 07/01/2017   HGB 10.0 (L) 07/01/2017   HCT 28.3 (L) 07/01/2017   MCV 89.1 07/01/2017   PLT 161 07/01/2017    Recent Labs Lab 07/01/17 0411  NA 141  K 3.9  CL 106  CO2 25  BUN 18  CREATININE 1.28*  CALCIUM 8.0*  GLUCOSE 79   Lab Results  Component Value Date   CHOL 140 01/05/2014   HDL 33 (L) 01/05/2014   LDLCALC 86 01/05/2014   TRIG 103 01/05/2014   No results found for: DDIMER  Radiology/Studies:  Dg Chest Portable 1 View  Result Date: 06/30/2017 CLINICAL DATA:  Shortness of breath. History of CHF. Rales. Labored breathing. Tachycardia. EXAM: PORTABLE CHEST 1 VIEW COMPARISON:  04/19/2017 FINDINGS: Cardiac pacemaker. Postoperative changes in the mediastinum. Cardiac enlargement with mild vascular congestion. Interstitial parenchymal pattern likely represents edema. No definite pleural effusions. No pneumothorax. No focal consolidation.  IMPRESSION: Cardiac enlargement with mild pulmonary vascular congestion and diffuse interstitial edema. Electronically Signed   By: Lucienne Capers M.D.   On: 06/30/2017 23:13    WER:XVQMGQ sinus rhythm  Weights: [employer]   06/30/17  2248 07/01/17 0116  Weight: 111.1 kg (245 lb) 112.4 kg (247 lb 12.8 oz)     Physical Exam: Blood pressure 121/62, pulse 72, temperature 97.6 F (36.4 C), temperature source Oral, resp. rate 14, height 5\' 8"  (1.727 m), weight 112.4 kg (247 lb 12.8 oz), SpO2 99 %. Body mass index is 37.68 kg/m. General: Well developed, well nourished, in no acute distress. Head eyes ears nose throat: Normocephalic, atraumatic, sclera non-icteric, no xanthomas, nares are without discharge. No apparent thyromegaly and/or mass  Lungs: Normal respiratory effort.Few wheezes, no rales, no rhonchi.  Heart: RRR with normal S1 S2. no murmur gallop, no rub, PMI is normal size and placement, carotid upstroke normal without bruit, jugular venous pressure is normal Abdomen: Soft, non-tender, non-distended with normoactive bowel sounds. No hepatomegaly. No rebound/guarding. No obvious abdominal masses. Abdominal aorta is normal size without bruit Extremities: Trace edema. no cyanosis, no clubbing, no ulcers  Peripheral : 2+ bilateral upper extremity pulses, 2+ bilateral femoral pulses, 2+ bilateral dorsal pedal pulse Neuro: Alert and oriented. No facial asymmetry. No focal deficit. Moves all extremities spontaneously. Musculoskeletal: Normal muscle tone without kyphosis Psych:  Responds to questions appropriately with a normal affect.    Assessment: 71 year old male with acute on chronic systolic dysfunction congestive heart failure cardiomyopathy LV systolic dysfunction coronary artery disease without evidence of myocardial ischemia or myocardial infarction on appropriate previous medications  Plan: 1. Reinstatement of all medication management as before including ACE inhibitor  Aldactone torsemide for cardiomyopathy congestive heart failure and fluid retention 2. Continue high intensity cholesterol therapy for coronary artery disease without evidence of myocardial infarction at this time 3. Continue Ranexa and isosorbide for further risk reduction of ischemic event potential 4. Patient is to continue to watch very closely for fluid retention and weight gain and salt intake as we have discussed before 5. CPAP machine use as before without change 6. Begin ambulation and possible discharge to home when feeling well with follow-up in 2 weeks  Signed, [name] M.D. [facility] 07/01/2017, 1:20 PM

## 2017-07-01 NOTE — Care Management (Signed)
Admitted to icu stepdown due to need for continuous bipap due to heart failure. Presented from home and has nocturnal oxygen trough [facility].  Home Cpap. Independent in his adls and no issues with obtaining medications.

## 2017-07-01 NOTE — Progress Notes (Signed)
[facility] Progress Note Patient Name: [name] DOB: 1946-02-26 MRN: [id number]   Date of Service  07/01/2017  HPI/Events of Note  7 M with resp distress requiring BiPAP.  Concern for CHF exacerbation given h/o of reduced EF.  Received 40 mg lasix.  Patient is HD stable on BiPAP with sats of 97%.  eICU Interventions  Plan of care as outlined in H&P and PCCM consult note.   Continue to monitor via [facility]     Intervention Category Evaluation Type: New Patient Evaluation  [name] 07/01/2017, 2:42 AM

## 2017-07-01 NOTE — Progress Notes (Signed)
CBG at 0730 65. Patient awake and alert and oriented, orange juice given, patient drank all of orange juice. Repeat CBG at 0745 84. Will continue to monitor patient.

## 2017-07-01 NOTE — Progress Notes (Signed)
[place] [facility] [place] NAME: Dajour Pierpoint    MR#:  [id number]  DATE OF BIRTH:  04-23-1946  SUBJECTIVE:  CHIEF COMPLAINT:   Chief Complaint  Patient presents with  . Shortness of Breath   Patient is off BiPAP and is improving. Continues to have some shortness of breath. On 3 L oxygen. Does not wear oxygen at home except at nighttime  REVIEW OF SYSTEMS:    Review of Systems  Constitutional: Positive for malaise/fatigue. Negative for chills, fever and weight loss.  HENT: Negative for hearing loss and nosebleeds.   Eyes: Negative for blurred vision, double vision and pain.  Respiratory: Positive for cough and shortness of breath. Negative for hemoptysis, sputum production and wheezing.   Cardiovascular: Negative for chest pain, palpitations, orthopnea and leg swelling.  Gastrointestinal: Negative for abdominal pain, constipation, diarrhea, nausea and vomiting.  Genitourinary: Negative for dysuria and hematuria.  Musculoskeletal: Negative for back pain, falls and myalgias.  Skin: Negative for rash.  Neurological: Positive for weakness. Negative for dizziness, tremors, sensory change, speech change, focal weakness, seizures and headaches.  Endo/Heme/Allergies: Does not bruise/bleed easily.  Psychiatric/Behavioral: Negative for depression and memory loss. The patient is not nervous/anxious.     DRUG ALLERGIES:   Allergies  Allergen Reactions  . Benadryl [Diphenhydramine] Other (See Comments)    " Hyperactivity"  . Doxycycline Swelling    Pt went into pulmonary edema.  . Lopid [Gemfibrozil] Swelling    "I gain 1 pound a day for 30 days."    VITALS:  Blood pressure 121/62, pulse 72, temperature 97.6 F (36.4 C), temperature source Oral, resp. rate 14, height 5\' 8"  (1.727 m), weight 112.4 kg (247 lb 12.8 oz), SpO2 99 %.  PHYSICAL EXAMINATION:   Physical Exam  GENERAL:  71 y.o.-year-old patient lying in the bed with no acute distress.  Morbidly obese. EYES: Pupils equal, round, reactive to light and accommodation. No scleral icterus. Extraocular muscles intact.  HEENT: Head atraumatic, normocephalic. Oropharynx and nasopharynx clear.  NECK:  Supple, no jugular venous distention. No thyroid enlargement, no tenderness.  LUNGS: Bilateral crackles. no wheezing, rales, rhonchi. No use of accessory muscles of respiration.  CARDIOVASCULAR: S1, S2 normal. No murmurs, rubs, or gallops.  ABDOMEN: Soft, nontender, nondistended. Bowel sounds present. No organomegaly or mass.  EXTREMITIES: No cyanosis, clubbing or edema b/l.    NEUROLOGIC: Cranial nerves II through XII are intact. No focal Motor or sensory deficits b/l.   PSYCHIATRIC: The patient is alert and oriented x 3.  SKIN: No obvious rash, lesion, or ulcer.   LABORATORY PANEL:   CBC  Recent Labs Lab 07/01/17 0411  WBC 9.9  HGB 10.0*  HCT 28.3*  PLT 161   ------------------------------------------------------------------------------------------------------------------ Chemistries   Recent Labs Lab 07/01/17 0411  NA 141  K 3.9  CL 106  CO2 25  GLUCOSE 79  BUN 18  CREATININE 1.28*  CALCIUM 8.0*   ------------------------------------------------------------------------------------------------------------------  Cardiac Enzymes  Recent Labs Lab 06/30/17 2256  TROPONINI <0.03   ------------------------------------------------------------------------------------------------------------------  RADIOLOGY:  Dg Chest Portable 1 View  Result Date: 06/30/2017 CLINICAL DATA:  Shortness of breath. History of CHF. Rales. Labored breathing. Tachycardia. EXAM: PORTABLE CHEST 1 VIEW COMPARISON:  04/19/2017 FINDINGS: Cardiac pacemaker. Postoperative changes in the mediastinum. Cardiac enlargement with mild vascular congestion. Interstitial parenchymal pattern likely represents edema. No definite pleural effusions. No pneumothorax. No focal consolidation. IMPRESSION:  Cardiac enlargement with mild pulmonary vascular congestion and diffuse interstitial edema. Electronically Signed  By: Lucienne Capers M.D.   On: 06/30/2017 23:13     ASSESSMENT AND PLAN:    # Acute on chronic respiratory failure with hypoxia Due to acute on chronic systolic CHF. Ejection fraction 20-25% Exacerbation likely due to hypertensive episode. Continue IV Lasix. Monitor input and output. Follow BUN and creatinine and potassium. Transition to oral diuretics if improved tomorrow morning. Wean oxygen as tolerated.  #  Diabetes ([place]) - sliding scale insulin with corresponding glucose checks   # Essential hypertension  Continue all medications. Off nitro drip.  #  Coronary artery disease - continue home meds   # Obstructive sleep apnea - CPAP   All the records are reviewed and case discussed with Care Management/Social Workerr. Management plans discussed with the patient, family and they are in agreement.  CODE STATUS: FULL CODE  DVT Prophylaxis: SCDs  TOTAL TIME TAKING CARE OF THIS PATIENT: 35 minutes.   POSSIBLE D/C IN 1-2 DAYS, DEPENDING ON CLINICAL CONDITION.  Hillary Bow R M.D on 07/01/2017 at 1:47 PM  Between 7am to 6pm - Pager - [phone]  After 6pm go to [email or web address] - password [email or web address] [facility]  Office  [phone]  CC: Primary care physician; [name], MD  Note: This dictation was prepared with Dragon dictation along with smaller phrase technology. Any transcriptional errors that result from this process are unintentional.

## 2017-07-02 DIAGNOSIS — J810 Acute pulmonary edema: Secondary | ICD-10-CM | POA: Diagnosis not present

## 2017-07-02 LAB — BASIC METABOLIC PANEL
ANION GAP: 9 (ref 5–15)
BUN: 24 mg/dL — AB (ref 6–20)
CALCIUM: 7.9 mg/dL — AB (ref 8.9–10.3)
CO2: 26 mmol/L (ref 22–32)
CREATININE: 1.52 mg/dL — AB (ref 0.61–1.24)
Chloride: 103 mmol/L (ref 101–111)
GFR calc Af Amer: 51 mL/min — ABNORMAL LOW (ref >60)
GFR, EST NON AFRICAN AMERICAN: 44 mL/min — AB (ref >60)
GLUCOSE: 129 mg/dL — AB (ref 65–99)
Potassium: 3.5 mmol/L (ref 3.5–5.1)
Sodium: 138 mmol/L (ref 135–145)

## 2017-07-02 LAB — CBC
HCT: 27.6 % — ABNORMAL LOW (ref 40.0–52.0)
Hemoglobin: 9.5 g/dL — ABNORMAL LOW (ref 13.0–18.0)
MCH: 30.7 pg (ref 26.0–34.0)
MCHC: 34.6 g/dL (ref 32.0–36.0)
MCV: 88.9 fL (ref 80.0–100.0)
PLATELETS: 157 10*3/uL (ref 150–440)
RBC: 3.1 MIL/uL — ABNORMAL LOW (ref 4.40–5.90)
RDW: 14.2 % (ref 11.5–14.5)
WBC: 8.5 10*3/uL (ref 3.8–10.6)

## 2017-07-02 LAB — GLUCOSE, CAPILLARY
Glucose-Capillary: 112 mg/dL — ABNORMAL HIGH (ref 65–99)
Glucose-Capillary: 154 mg/dL — ABNORMAL HIGH (ref 65–99)

## 2017-07-02 LAB — MAGNESIUM: Magnesium: 1.2 mg/dL — ABNORMAL LOW (ref 1.7–2.4)

## 2017-07-02 NOTE — Progress Notes (Signed)
Discharge: Pt d/c from room via wheelchair, Family member with the pt. Discharge instructions given to the patient and family members.  No questions from pt, reintegrated to the pt to call or go to the ED for chest discomfort. Pt dressed in street clothes and left with discharge papers and prescriptions in hand. IV d/ced, tele removed and no complaints of pain or discomfort. 

## 2017-07-02 NOTE — Progress Notes (Signed)
Notified by CCMD of frequent runs of vtach. Pt asymptomatic,Notified MD. Orders placed. Will continue to monitor and assess.

## 2017-07-02 NOTE — Consult Note (Signed)
[name] is a 71 y.o. male  [id number]  Primary Cardiologist: [name] Reason for Consultation: CHF  HPI: This is a 71 year old white male with a past medical history of congestive heart failure coronary artery disease severe LV dysfunction presented to the hospital with shortness of breath orthopnea PND and leg swelling. Patient is feeling much better right now.   Review of Systems: No chest pain dizziness or passing out.   Past Medical History:  Diagnosis Date  . Anemia   . Cancer ([place]) 12/2013   prostate  . 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 Cardiomyopathy, ischemic   . CHF (congestive heart failure) ([place])   . COPD (chronic obstructive pulmonary disease) ([place])   . Diabetes mellitus without complication ([place])   . GERD (gastroesophageal reflux disease)   . Hypercholesteremia   . Hypertension   . Myocardial infarction ([place]) [id number]  . Shortness of breath dyspnea     Medications Prior to Admission  Medication Sig Dispense Refill  . acetaminophen (TYLENOL) 325 MG tablet Take 2 tablets (650 mg total) by mouth every 6 (six) hours as needed for mild pain (or Fever >/= 101).    Marland Kitchen albuterol (PROVENTIL HFA;VENTOLIN HFA) 108 (90 Base) MCG/ACT inhaler Inhale 2 puffs into the lungs 4 (four) times daily as needed for wheezing or shortness of breath.     Marland Kitchen albuterol-ipratropium (COMBIVENT) 18-103 MCG/ACT inhaler Inhale 1 puff into the lungs 2 times daily at 12 noon and 4 pm.    . atorvastatin (LIPITOR) 80 MG tablet Take 1 tablet (80 mg total) by mouth every evening. 30 tablet 3  . budesonide-formoterol (SYMBICORT) 80-4.5 MCG/ACT inhaler Inhale 2 puffs into the lungs 2 (two) times daily.    . calcium carbonate (TUMS - DOSED IN MG ELEMENTAL CALCIUM) 500 MG chewable tablet Chew 1 tablet by mouth as needed for indigestion or heartburn.    . carvedilol (COREG) 6.25 MG tablet Take 6.25 mg by mouth 2 (two) times daily with a meal.    . clopidogrel (PLAVIX) 75 MG  tablet Take 75 mg by mouth every morning.    Marland Kitchen co-enzyme Q-10 30 MG capsule Take 30 mg by mouth daily.    . ferrous sulfate 325 (65 FE) MG tablet Take 325 mg by mouth 2 (two) times daily with a meal.    . finasteride (PROSCAR) 5 MG tablet Take 5 mg by mouth at bedtime.    Marland Kitchen FLUoxetine (PROZAC) 20 MG capsule Take 20 mg by mouth daily.    Marland Kitchen gabapentin (NEURONTIN) 300 MG capsule Take 300 mg by mouth 2 (two) times daily.     . insulin glargine (LANTUS) 100 UNIT/ML injection Inject 100 Units into the skin at bedtime.     . insulin lispro (HUMALOG) 100 UNIT/ML KiwkPen Inject 8-18 Units into the skin 3 (three) times daily with meals. Per sliding scale    . isosorbide mononitrate (IMDUR) 60 MG 24 hr tablet Take 60 mg by mouth daily.    . lansoprazole (PREVACID SOLUTAB) 30 MG disintegrating tablet Take 30 mg by mouth 2 (two) times daily.    Marland Kitchen lisinopril (PRINIVIL,ZESTRIL) 20 MG tablet Take 20 mg by mouth daily.     Marland Kitchen loratadine (CLARITIN) 10 MG tablet Take 10 mg by mouth daily.    . Multiple Vitamins-Minerals (CENTRUM SILVER PO) Take 1 tablet by mouth every morning.    . nitroGLYCERIN (NITROSTAT) 0.4 MG SL tablet Place 0.4 mg under the tongue every 5 (five) minutes as  needed for chest pain.    . potassium chloride (K-DUR) 10 MEQ tablet Take 1 tablet (10 mEq total) by mouth daily. 30 tablet 3  . ranolazine (RANEXA) 500 MG 12 hr tablet Take 500 mg by mouth 2 (two) times daily.    Marland Kitchen spironolactone (ALDACTONE) 25 MG tablet Take 12.5 mg by mouth daily.    . sucralfate (CARAFATE) 1 g tablet Take 1 g by mouth 2 (two) times daily.    . tamsulosin (FLOMAX) 0.4 MG CAPS capsule Take 0.4 mg by mouth daily after supper.    . torsemide (DEMADEX) 20 MG tablet Take 40 mg by mouth daily. May take 1-2 additional tablets as needed for fluid       . aspirin  81 mg Oral BH-q7a  . atorvastatin  80 mg Oral QPM  . carvedilol  6.25 mg Oral BID WC  . chlorhexidine  15 mL Mouth Rinse BID  . clopidogrel  75 mg Oral BH-q7a  .  enoxaparin (LOVENOX) injection  40 mg Subcutaneous Q24H  . finasteride  5 mg Oral QHS  . FLUoxetine  20 mg Oral Daily  . gabapentin  600 mg Oral TID  . insulin aspart  0-5 Units Subcutaneous QHS  . insulin aspart  0-9 Units Subcutaneous TID WC  . isosorbide mononitrate  60 mg Oral Daily  . mouth rinse  15 mL Mouth Rinse q12n4p  . mometasone-formoterol  2 puff Inhalation BID  . pantoprazole  40 mg Oral BID  . ranolazine  500 mg Oral BID  . spironolactone  12.5 mg Oral Daily  . tamsulosin  0.4 mg Oral QPC supper  . torsemide  40 mg Oral Daily    Infusions:   Allergies  Allergen Reactions  . Benadryl [Diphenhydramine] Other (See Comments)    " Hyperactivity"  . Doxycycline Swelling    Pt went into pulmonary edema.  . Lopid [Gemfibrozil] Swelling    "I gain 1 pound a day for 30 days."    Social History   Social History  . Marital status: Married    Spouse name: N/A  . Number of children: N/A  . Years of education: N/A   Occupational History  . retired    Social History Main Topics  . Smoking status: Former Smoker    Packs/day: 2.00    Years: 30.00    Quit date: 12/03/1996  . Smokeless tobacco: Never Used  . Alcohol use No  . Drug use: No  . Sexual activity: Not on file   Other Topics Concern  . Not on file   Social History Narrative  . No narrative on file    Family History  Problem Relation Age of Onset  . CAD Mother   . Cancer Mother   . Diabetes Mother   . Alzheimer's disease Father   . Cancer Father   . Heart disease Father     PHYSICAL EXAM: Vitals:   07/02/17 0504 07/02/17 0845  BP: 125/83 (!) 112/51  Pulse: 73 71  Resp: 18   Temp: 99.4 F (37.4 C) 98.3 F (36.8 C)     Intake/Output Summary (Last 24 hours) at 07/02/17 1110 Last data filed at 07/02/17 0900  Gross per 24 hour  Intake              240 ml  Output             1500 ml  Net            -1260 ml  General:  Well appearing. No respiratory difficulty HEENT: normal Neck:  supple. no JVD. Carotids 2+ bilat; no bruits. No lymphadenopathy or thryomegaly appreciated. Cor: PMI nondisplaced. Regular rate & rhythm. No rubs, gallops or murmurs. Lungs: clear Abdomen: soft, nontender, nondistended. No hepatosplenomegaly. No bruits or masses. Good bowel sounds. Extremities: no cyanosis, clubbing, rash, edema Neuro: alert & oriented x 3, cranial nerves grossly intact. moves all 4 extremities w/o difficulty. Affect pleasant.  ECG:VVI 100% paced rhythm  Results for orders placed or performed during the hospital encounter of 06/30/17 (from the past 24 hour(s))  Glucose, capillary     Status: Abnormal   Collection Time: 07/01/17 12:05 PM  Result Value Ref Range   Glucose-Capillary 102 (H) 65 - 99 mg/dL  Glucose, capillary     Status: Abnormal   Collection Time: 07/01/17  5:50 PM  Result Value Ref Range   Glucose-Capillary 157 (H) 65 - 99 mg/dL   Comment 1 Notify RN   Glucose, capillary     Status: Abnormal   Collection Time: 07/01/17  9:18 PM  Result Value Ref Range   Glucose-Capillary 123 (H) 65 - 99 mg/dL  CBC     Status: Abnormal   Collection Time: 07/02/17  5:30 AM  Result Value Ref Range   WBC 8.5 3.8 - 10.6 K/uL   RBC 3.10 (L) 4.40 - 5.90 MIL/uL   Hemoglobin 9.5 (L) 13.0 - 18.0 g/dL   HCT 27.6 (L) 40.0 - 52.0 %   MCV 88.9 80.0 - 100.0 fL   MCH 30.7 26.0 - 34.0 pg   MCHC 34.6 32.0 - 36.0 g/dL   RDW 14.2 11.5 - 14.5 %   Platelets 157 150 - 440 K/uL  Basic metabolic panel     Status: Abnormal   Collection Time: 07/02/17  5:30 AM  Result Value Ref Range   Sodium 138 135 - 145 mmol/L   Potassium 3.5 3.5 - 5.1 mmol/L   Chloride 103 101 - 111 mmol/L   CO2 26 22 - 32 mmol/L   Glucose, Bld 129 (H) 65 - 99 mg/dL   BUN 24 (H) 6 - 20 mg/dL   Creatinine, Ser 1.52 (H) 0.61 - 1.24 mg/dL   Calcium 7.9 (L) 8.9 - 10.3 mg/dL   GFR calc non Af Amer 44 (L) >60 mL/min   GFR calc Af Amer 51 (L) >60 mL/min   Anion gap 9 5 - 15  Magnesium     Status: Abnormal    Collection Time: 07/02/17  5:30 AM  Result Value Ref Range   Magnesium 1.2 (L) 1.7 - 2.4 mg/dL  Glucose, capillary     Status: Abnormal   Collection Time: 07/02/17  7:52 AM  Result Value Ref Range   Glucose-Capillary 112 (H) 65 - 99 mg/dL   Comment 1 Notify RN    Dg Chest Portable 1 View  Result Date: 06/30/2017 CLINICAL DATA:  Shortness of breath. History of CHF. Rales. Labored breathing. Tachycardia. EXAM: PORTABLE CHEST 1 VIEW COMPARISON:  04/19/2017 FINDINGS: Cardiac pacemaker. Postoperative changes in the mediastinum. Cardiac enlargement with mild vascular congestion. Interstitial parenchymal pattern likely represents edema. No definite pleural effusions. No pneumothorax. No focal consolidation. IMPRESSION: Cardiac enlargement with mild pulmonary vascular congestion and diffuse interstitial edema. Electronically Signed   By: Lucienne Capers M.D.   On: 06/30/2017 23:13     ASSESSMENT AND PLAN:Congestive heart failure due to severe LV dysfunction with left ventricular ejection fraction in the past being 20-25% on last echo a few  months ago. Patient has dual-chamber defibrillator and currently is pacing. Patient also has history of coronary artery disease status post CABG and multiple PCI's in the past. He denies chest pain but was admitted with decompensated heart failure. At this time feeling much better getting Lasix IV has improved the symptoms. There is no evidence of any crepitation on the lung field and and is feeling much better. Patient can be discharged with follow-up Dr. Nehemiah Massed in a week or 2.  Maryann Mccall A

## 2017-07-02 NOTE — Progress Notes (Signed)
Pt alert and oriented x4, no complaints of pain or discomfort.  Bed in low position, call bell within reach.  Bed alarms on and functioning.  Assessment done and charted.  Will continue to monitor and do hourly rounding throughout the shift 

## 2017-07-02 NOTE — Discharge Instructions (Signed)
Heart Failure Clinic appointment on July 12 2017 at 12:40pm with [name], [place]. Please call [phone] to reschedule.   Diet and activiy as before

## 2017-07-06 NOTE — Discharge Summary (Signed)
[facility] [place] NAME: [name]    MR#:  [id number]  DATE OF BIRTH:  1946-04-20  DATE OF ADMISSION:  06/30/2017 ADMITTING PHYSICIAN: Lance Coon, MD  DATE OF DISCHARGE: 07/02/2017 12:46 PM  PRIMARY CARE PHYSICIAN: [name], MD   ADMISSION DIAGNOSIS:  Flash pulmonary edema ([place]) [J81.0] Acute decompensated heart failure ([place]) [I50.9]  DISCHARGE DIAGNOSIS:  Principal Problem:   Acute on chronic respiratory failure with hypoxia (HCC) Active Problems:   Obstructive sleep apnea   Diabetes (HCC)   Acute on chronic systolic CHF (congestive heart failure) (HCC)   Essential hypertension   Coronary artery disease   Flash pulmonary edema ([place])   SECONDARY DIAGNOSIS:   Past Medical History:  Diagnosis Date  . Anemia   . Cancer ([place]) 12/2013   prostate  . Cardiogenic pulmonary edema ([place]) 12/19/2014  . Cardiomyopathy, ischemic   . CHF (congestive heart failure) ([place])   . COPD (chronic obstructive pulmonary disease) ([place])   . Diabetes mellitus without complication ([place])   . GERD (gastroesophageal reflux disease)   . Hypercholesteremia   . Hypertension   . Myocardial infarction ([place]) [id number]  . Shortness of breath dyspnea      ADMITTING HISTORY  HISTORY OF PRESENT ILLNESS:  [name]  is a 71 y.o. male who presents with Acute onset shortness of breath. Patient states that he was sitting at home in his chair this afternoon when he suddenly became short of breath. He has a history of severe systolic heart failure. He took some extra doses of his diuretic and put his CPAP machine on, which he states helped him for sometime this afternoon. However, this evening he went to lay down and was acutely short of breath again. He came to the ED for evaluation. Here he was found to have significant edema on chest x-ray. His blood pressure was also elevated. He was placed on BiPAP, and a nitro drip with significant  improvement in his symptoms. Hospitalists were called for admission.   HOSPITAL COURSE:   #Acute on chronic respiratory failure with hypoxia Due to acute on chronic systolic CHF. Ejection fraction 20-25% Started on IV Lasix and BiPAP and admitted to ICU. Patient improved well with ongoing diuresis. Off BiPAP and transitioned to nasal cannula. By the day of discharge patient is on room air saturating 94%. Intensivist in the ICU.  #Diabetes ([place])  Continue home Lantus and NovoLog.  #Essential hypertension  Continue all medications. Started on nitro drip initially which has been stopped.  #Coronary artery disease - continue home meds  #Obstructive sleep apnea - CPAP  Left ankle pain. He recently saw his primary care physician and x-ray was done which showed no fractures. He likely has a sprain. Ambulated in the hospital prior to discharge. Tylenol as needed for pain.  Patient is stable for discharge.  CONSULTS OBTAINED:  Treatment Team:  [name], MD Dionisio David, MD  DRUG ALLERGIES:   Allergies  Allergen Reactions  . Benadryl [Diphenhydramine] Other (See Comments)    " Hyperactivity"  . Doxycycline Swelling    Pt went into pulmonary edema.  . Lopid [Gemfibrozil] Swelling    "I gain 1 pound a day for 30 days."    DISCHARGE MEDICATIONS:   Discharge Medication List as of 07/02/2017 12:26 PM    CONTINUE these medications which have NOT CHANGED   Details  acetaminophen (TYLENOL) 325 MG tablet Take 2 tablets (650 mg total) by mouth every  6 (six) hours as needed for mild pain (or Fever >/= 101)., Starting Mon 02/02/2016, OTC    albuterol (PROVENTIL HFA;VENTOLIN HFA) 108 (90 Base) MCG/ACT inhaler Inhale 2 puffs into the lungs 4 (four) times daily as needed for wheezing or shortness of breath. , Historical Med    albuterol-ipratropium (COMBIVENT) 18-103 MCG/ACT inhaler Inhale 1 puff into the lungs 2 times daily at 12 noon and 4 pm., Historical Med     atorvastatin (LIPITOR) 80 MG tablet Take 1 tablet (80 mg total) by mouth every evening., Starting Thu 04/21/2017, Normal    budesonide-formoterol (SYMBICORT) 80-4.5 MCG/ACT inhaler Inhale 2 puffs into the lungs 2 (two) times daily., Until Discontinued, Historical Med    calcium carbonate (TUMS - DOSED IN MG ELEMENTAL CALCIUM) 500 MG chewable tablet Chew 1 tablet by mouth as needed for indigestion or heartburn., Historical Med    carvedilol (COREG) 6.25 MG tablet Take 6.25 mg by mouth 2 (two) times daily with a meal., Historical Med    clopidogrel (PLAVIX) 75 MG tablet Take 75 mg by mouth every morning., Until Discontinued, Historical Med    co-enzyme Q-10 30 MG capsule Take 30 mg by mouth daily., Historical Med    ferrous sulfate 325 (65 FE) MG tablet Take 325 mg by mouth 2 (two) times daily with a meal., Until Discontinued, Historical Med    finasteride (PROSCAR) 5 MG tablet Take 5 mg by mouth at bedtime., Until Discontinued, Historical Med    FLUoxetine (PROZAC) 20 MG capsule Take 20 mg by mouth daily., Historical Med    gabapentin (NEURONTIN) 300 MG capsule Take 300 mg by mouth 2 (two) times daily. , Historical Med    insulin glargine (LANTUS) 100 UNIT/ML injection Inject 100 Units into the skin at bedtime. , Historical Med    insulin lispro (HUMALOG) 100 UNIT/ML KiwkPen Inject 8-18 Units into the skin 3 (three) times daily with meals. Per sliding scale, Historical Med    isosorbide mononitrate (IMDUR) 60 MG 24 hr tablet Take 60 mg by mouth daily., Historical Med    lansoprazole (PREVACID SOLUTAB) 30 MG disintegrating tablet Take 30 mg by mouth 2 (two) times daily., Historical Med    lisinopril (PRINIVIL,ZESTRIL) 20 MG tablet Take 20 mg by mouth daily. , Historical Med    loratadine (CLARITIN) 10 MG tablet Take 10 mg by mouth daily., Until Discontinued, Historical Med    Multiple Vitamins-Minerals (CENTRUM SILVER PO) Take 1 tablet by mouth every morning., Until Discontinued,  Historical Med    nitroGLYCERIN (NITROSTAT) 0.4 MG SL tablet Place 0.4 mg under the tongue every 5 (five) minutes as needed for chest pain., Until Discontinued, Historical Med    potassium chloride (K-DUR) 10 MEQ tablet Take 1 tablet (10 mEq total) by mouth daily., Starting Thu 04/21/2017, Normal    ranolazine (RANEXA) 500 MG 12 hr tablet Take 500 mg by mouth 2 (two) times daily., Until Discontinued, Historical Med    spironolactone (ALDACTONE) 25 MG tablet Take 12.5 mg by mouth daily., Historical Med    sucralfate (CARAFATE) 1 g tablet Take 1 g by mouth 2 (two) times daily., Historical Med    tamsulosin (FLOMAX) 0.4 MG CAPS capsule Take 0.4 mg by mouth daily after supper., Until Discontinued, Historical Med    torsemide (DEMADEX) 20 MG tablet Take 40 mg by mouth daily. May take 1-2 additional tablets as needed for fluid, Historical Med      STOP taking these medications     aspirin 81 MG tablet  pantoprazole (PROTONIX) 40 MG tablet         Today   VITAL SIGNS:  Blood pressure 115/71, pulse 74, temperature 98 F (36.7 C), temperature source Oral, resp. rate 18, height 5\' 8"  (1.727 m), weight 113.1 kg (249 lb 4.8 oz), SpO2 92 %.  I/O:  No intake or output data in the 24 hours ending 07/06/17 1310  PHYSICAL EXAMINATION:  Physical Exam  GENERAL:  71 y.o.-year-old patient lying in the bed with no acute distress.  LUNGS: Normal breath sounds bilaterally, no wheezing, rales,rhonchi or crepitation. No use of accessory muscles of respiration.  CARDIOVASCULAR: S1, S2 normal. No murmurs, rubs, or gallops.  ABDOMEN: Soft, non-tender, non-distended. Bowel sounds present. No organomegaly or mass.  NEUROLOGIC: Moves all 4 extremities. PSYCHIATRIC: The patient is alert and oriented x 3.  SKIN: No obvious rash, lesion, or ulcer.   DATA REVIEW:   CBC  Recent Labs Lab 07/02/17 0530  WBC 8.5  HGB 9.5*  HCT 27.6*  PLT 157    Chemistries   Recent Labs Lab 07/02/17 0530   NA 138  K 3.5  CL 103  CO2 26  GLUCOSE 129*  BUN 24*  CREATININE 1.52*  CALCIUM 7.9*  MG 1.2*    Cardiac Enzymes  Recent Labs Lab 06/30/17 2256  TROPONINI <0.03    Microbiology Results  Results for orders placed or performed during the hospital encounter of 06/30/17  MRSA PCR Screening     Status: None   Collection Time: 07/01/17  1:16 AM  Result Value Ref Range Status   MRSA by PCR NEGATIVE NEGATIVE Final    Comment:        The GeneXpert MRSA Assay (FDA approved for NASAL specimens only), is one component of a comprehensive MRSA colonization surveillance program. It is not intended to diagnose MRSA infection nor to guide or monitor treatment for MRSA infections.     RADIOLOGY:  No results found.  Follow up with PCP in 1 week.  Management plans discussed with the patient, family and they are in agreement.  CODE STATUS:  Code Status History    Date Active Date Inactive Code Status Order ID Comments User Context   07/01/2017  1:19 AM 07/02/2017  3:46 PM Full Code [id number]  Lance Coon, MD Inpatient   04/20/2017  2:32 AM 04/21/2017  6:16 PM Full Code [id number]  Saundra Shelling, MD Inpatient   01/31/2016  1:43 AM 02/02/2016  9:11 PM Full Code [id number]  Hillary Bow, MD ED   12/12/2015  4:27 PM 12/13/2015  3:29 PM Full Code [id number]  [name], MD Inpatient    Advance Directive Documentation     Most Recent Value  Type of Advance Directive  Healthcare Power of Attorney  Pre-existing out of facility DNR order (yellow form or pink MOST form)  -  "MOST" Form in Place?  -      TOTAL TIME TAKING CARE OF THIS PATIENT ON DAY OF DISCHARGE: more than 30 minutes.   Hillary Bow R M.D on 07/06/2017 at 1:10 PM  Between 7am to 6pm - Pager - [phone]  After 6pm go to [email or web address] - password [email or web address] [name]  Office  [phone]  CC: Primary care physician; [name], MD  Note: This dictation was prepared with Dragon  dictation along with smaller phrase technology. Any transcriptional errors that result from this process are unintentional.

## 2017-07-12 ENCOUNTER — Ambulatory Visit: Payer: PPO | Admitting: Family

## 2017-07-12 ENCOUNTER — Telehealth: Payer: Self-pay | Admitting: Family

## 2017-07-12 NOTE — Telephone Encounter (Signed)
Patient did not show for his [facility] appointment on 07/12/17. Will attempt to reschedule. Of note, this is the 3rd appointment that he has missed.

## 2017-07-16 ENCOUNTER — Emergency Department: Payer: PPO

## 2017-07-16 ENCOUNTER — Inpatient Hospital Stay
Admission: EM | Admit: 2017-07-16 | Discharge: 2017-07-18 | DRG: 292 | Disposition: A | Discharge status: Home / Self Care | Payer: PPO | Attending: Internal Medicine | Admitting: Internal Medicine

## 2017-07-16 DIAGNOSIS — Z87891 Personal history of nicotine dependence: Secondary | ICD-10-CM | POA: Diagnosis not present

## 2017-07-16 DIAGNOSIS — Z6836 Body mass index (BMI) 36.0-36.9, adult: Secondary | ICD-10-CM

## 2017-07-16 DIAGNOSIS — E785 Hyperlipidemia, unspecified: Secondary | ICD-10-CM | POA: Diagnosis present

## 2017-07-16 DIAGNOSIS — E7800 Pure hypercholesterolemia, unspecified: Secondary | ICD-10-CM | POA: Diagnosis present

## 2017-07-16 DIAGNOSIS — Z9114 Patient's other noncompliance with medication regimen: Secondary | ICD-10-CM

## 2017-07-16 DIAGNOSIS — I248 Other forms of acute ischemic heart disease: Secondary | ICD-10-CM | POA: Diagnosis present

## 2017-07-16 DIAGNOSIS — J441 Chronic obstructive pulmonary disease with (acute) exacerbation: Secondary | ICD-10-CM | POA: Diagnosis present

## 2017-07-16 DIAGNOSIS — Z8249 Family history of ischemic heart disease and other diseases of the circulatory system: Secondary | ICD-10-CM | POA: Diagnosis not present

## 2017-07-16 DIAGNOSIS — K219 Gastro-esophageal reflux disease without esophagitis: Secondary | ICD-10-CM | POA: Diagnosis present

## 2017-07-16 DIAGNOSIS — I252 Old myocardial infarction: Secondary | ICD-10-CM

## 2017-07-16 DIAGNOSIS — R0602 Shortness of breath: Secondary | ICD-10-CM

## 2017-07-16 DIAGNOSIS — N179 Acute kidney failure, unspecified: Secondary | ICD-10-CM | POA: Diagnosis present

## 2017-07-16 DIAGNOSIS — Z888 Allergy status to other drugs, medicaments and biological substances status: Secondary | ICD-10-CM

## 2017-07-16 DIAGNOSIS — E119 Type 2 diabetes mellitus without complications: Secondary | ICD-10-CM | POA: Diagnosis not present

## 2017-07-16 DIAGNOSIS — E1142 Type 2 diabetes mellitus with diabetic polyneuropathy: Secondary | ICD-10-CM | POA: Diagnosis present

## 2017-07-16 DIAGNOSIS — R7989 Other specified abnormal findings of blood chemistry: Secondary | ICD-10-CM | POA: Diagnosis not present

## 2017-07-16 DIAGNOSIS — D638 Anemia in other chronic diseases classified elsewhere: Secondary | ICD-10-CM | POA: Diagnosis present

## 2017-07-16 DIAGNOSIS — G4733 Obstructive sleep apnea (adult) (pediatric): Secondary | ICD-10-CM | POA: Diagnosis present

## 2017-07-16 DIAGNOSIS — Z881 Allergy status to other antibiotic agents status: Secondary | ICD-10-CM

## 2017-07-16 DIAGNOSIS — E669 Obesity, unspecified: Secondary | ICD-10-CM | POA: Diagnosis present

## 2017-07-16 DIAGNOSIS — I110 Hypertensive heart disease with heart failure: Secondary | ICD-10-CM | POA: Diagnosis present

## 2017-07-16 DIAGNOSIS — R778 Other specified abnormalities of plasma proteins: Secondary | ICD-10-CM

## 2017-07-16 DIAGNOSIS — Z9111 Patient's noncompliance with dietary regimen: Secondary | ICD-10-CM

## 2017-07-16 DIAGNOSIS — I5023 Acute on chronic systolic (congestive) heart failure: Secondary | ICD-10-CM | POA: Diagnosis present

## 2017-07-16 DIAGNOSIS — E876 Hypokalemia: Secondary | ICD-10-CM | POA: Diagnosis present

## 2017-07-16 DIAGNOSIS — Z951 Presence of aortocoronary bypass graft: Secondary | ICD-10-CM | POA: Diagnosis not present

## 2017-07-16 DIAGNOSIS — Z794 Long term (current) use of insulin: Secondary | ICD-10-CM

## 2017-07-16 DIAGNOSIS — I255 Ischemic cardiomyopathy: Secondary | ICD-10-CM | POA: Diagnosis present

## 2017-07-16 DIAGNOSIS — I10 Essential (primary) hypertension: Secondary | ICD-10-CM | POA: Diagnosis not present

## 2017-07-16 DIAGNOSIS — Z8546 Personal history of malignant neoplasm of prostate: Secondary | ICD-10-CM

## 2017-07-16 DIAGNOSIS — I25119 Atherosclerotic heart disease of native coronary artery with unspecified angina pectoris: Secondary | ICD-10-CM | POA: Diagnosis present

## 2017-07-16 DIAGNOSIS — Z7902 Long term (current) use of antithrombotics/antiplatelets: Secondary | ICD-10-CM

## 2017-07-16 DIAGNOSIS — I2510 Atherosclerotic heart disease of native coronary artery without angina pectoris: Secondary | ICD-10-CM | POA: Diagnosis not present

## 2017-07-16 DIAGNOSIS — Z95810 Presence of automatic (implantable) cardiac defibrillator: Secondary | ICD-10-CM

## 2017-07-16 DIAGNOSIS — I509 Heart failure, unspecified: Secondary | ICD-10-CM

## 2017-07-16 DIAGNOSIS — Z7951 Long term (current) use of inhaled steroids: Secondary | ICD-10-CM

## 2017-07-16 LAB — CBC WITH DIFFERENTIAL/PLATELET
BASOS PCT: 0 %
Basophils Absolute: 0 10*3/uL (ref 0–0.1)
Eosinophils Absolute: 0.1 10*3/uL (ref 0–0.7)
Eosinophils Relative: 1 %
HEMATOCRIT: 28.8 % — AB (ref 40.0–52.0)
HEMOGLOBIN: 9.6 g/dL — AB (ref 13.0–18.0)
LYMPHS PCT: 11 %
Lymphs Abs: 0.7 10*3/uL — ABNORMAL LOW (ref 1.0–3.6)
MCH: 29.6 pg (ref 26.0–34.0)
MCHC: 33.3 g/dL (ref 32.0–36.0)
MCV: 88.9 fL (ref 80.0–100.0)
MONOS PCT: 5 %
Monocytes Absolute: 0.4 10*3/uL (ref 0.2–1.0)
NEUTROS ABS: 5.9 10*3/uL (ref 1.4–6.5)
NEUTROS PCT: 83 %
Platelets: 237 10*3/uL (ref 150–440)
RBC: 3.24 MIL/uL — ABNORMAL LOW (ref 4.40–5.90)
RDW: 14 % (ref 11.5–14.5)
WBC: 7.1 10*3/uL (ref 3.8–10.6)

## 2017-07-16 MED ORDER — METHYLPREDNISOLONE SODIUM SUCC 125 MG IJ SOLR
125.0000 mg | Freq: Once | INTRAMUSCULAR | Status: AC
  Administered 2017-07-16: 125 mg via INTRAVENOUS
  Filled 2017-07-16: qty 2

## 2017-07-16 MED ORDER — IPRATROPIUM-ALBUTEROL 0.5-2.5 (3) MG/3ML IN SOLN
3.0000 mL | Freq: Once | RESPIRATORY_TRACT | Status: AC
  Administered 2017-07-16: 3 mL via RESPIRATORY_TRACT
  Filled 2017-07-16: qty 3

## 2017-07-16 NOTE — ED Triage Notes (Signed)
Pt to room 26 via EMS. In no acute distress.  Medicated self at home w/ nitro SLx2, albuterol MDI, and 40mg  torsemide.

## 2017-07-16 NOTE — ED Provider Notes (Signed)
[facility] Emergency Department Provider Note   ____________________________________________   First MD Initiated Contact with Patient 07/16/17 2323     (approximate)  I have reviewed the triage vital signs and the nursing notes.   HISTORY  Chief Complaint Shortness of breath   HPI [name] is a 71 y.o. male brought to the ED from home via EMS with a chief complaint of shortness of breath. Patient has a history of COPD, CHF on torsemide who wears oxygen only at night. Onset of shortness of breath approximately 10 PM. Patient took an extra 40 mg torsemide at that time. He received 2 nitroglycerin sprays per EMS for chest tightness. Currently is chest pain-free. Admitted 2 weeks ago for flash pulmonary edema. Denies recent fever, chills, cough/congestion, abdominal pain, nausea, vomiting. Denies weight gain or swelling of legs. Denies recent travel or trauma.   Past Medical History:  Diagnosis Date  . Anemia   . Cancer ([place]) 12/2013   prostate  . Cardiogenic pulmonary edema ([place]) 12/19/2014  . Cardiomyopathy, ischemic   . CHF (congestive heart failure) ([place])   . COPD (chronic obstructive pulmonary disease) ([place])   . Diabetes mellitus without complication ([place])   . GERD (gastroesophageal reflux disease)   . Hypercholesteremia   . Hypertension   . Myocardial infarction ([place]) [id number]  . Shortness of breath dyspnea     Patient Active Problem List   Diagnosis Date Noted  . Flash pulmonary edema ([place]) 07/01/2017  . Acute on chronic systolic CHF (congestive heart failure) ([place]) 04/21/2017  . Acute on chronic respiratory failure with hypoxia ([place]) 04/21/2017  . Hypokalemia 04/21/2017  . Hypomagnesemia 04/21/2017  . Essential hypertension 04/21/2017  . Hyperlipidemia 04/21/2017  . Coronary artery disease 04/21/2017  . CHF (congestive heart failure) ([place]) 04/20/2017  . HTN (hypertension) 02/13/2016  . COPD with chronic bronchitis ([place])  02/13/2016  . Obstructive sleep apnea 02/13/2016  . Diabetes ([place]) 02/13/2016  . Chronic systolic HF (heart failure) ([place]) 12/12/2015    Past Surgical History:  Procedure Laterality Date  . CARDIAC CATHETERIZATION N/A 02/02/2016   Procedure: Left Heart Cath and Coronary Angiography;  Surgeon: [name], MD;  Location: [facility];  Service: Cardiovascular;  Laterality: N/A;  . CORONARY ANGIOPLASTY WITH STENT PLACEMENT    . CORONARY ARTERY BYPASS GRAFT  11/24/2010  . IMPLANTABLE CARDIOVERTER DEFIBRILLATOR (ICD) GENERATOR CHANGE Left 12/12/2015   Procedure: DUAL LEAD PLACEMENT CARDIAC DIFIBRILLATOR;  Surgeon: [name], MD;  Location: [facility] ORS;  Service: Cardiovascular;  Laterality: Left;  . TONSILLECTOMY      Prior to Admission medications   Medication Sig Start Date End Date Taking? Authorizing Provider  acetaminophen (TYLENOL) 325 MG tablet Take 2 tablets (650 mg total) by mouth every 6 (six) hours as needed for mild pain (or Fever >/= 101). 02/02/16  Yes Gouru, Illene Silver, MD  albuterol (PROVENTIL HFA;VENTOLIN HFA) 108 (90 Base) MCG/ACT inhaler Inhale 2 puffs into the lungs 4 (four) times daily as needed for wheezing or shortness of breath.    Yes [provider]  albuterol-ipratropium (COMBIVENT) 18-103 MCG/ACT inhaler Inhale 1 puff into the lungs 2 times daily at 12 noon and 4 pm.   Yes [provider]  atorvastatin (LIPITOR) 80 MG tablet Take 1 tablet (80 mg total) by mouth every evening. 04/21/17  Yes Theodoro [name], MD  budesonide-formoterol (SYMBICORT) 80-4.5 MCG/ACT inhaler Inhale 2 puffs into the lungs 2 (two) times daily.   Yes [provider]  calcium carbonate (TUMS - DOSED IN MG ELEMENTAL CALCIUM) 500 MG chewable tablet Chew 1 tablet by mouth as needed for indigestion or heartburn.   Yes [provider]  carvedilol (COREG) 6.25 MG tablet Take 6.25 mg by mouth 2 (two) times daily with a meal.   Yes [provider]    clopidogrel (PLAVIX) 75 MG tablet Take 75 mg by mouth every morning.   Yes [provider]  co-enzyme Q-10 30 MG capsule Take 30 mg by mouth daily.   Yes [provider]  ferrous sulfate 325 (65 FE) MG tablet Take 325 mg by mouth 2 (two) times daily with a meal.   Yes [provider]  finasteride (PROSCAR) 5 MG tablet Take 5 mg by mouth at bedtime.   Yes [provider]  FLUoxetine (PROZAC) 20 MG capsule Take 20 mg by mouth daily.   Yes [provider]  gabapentin (NEURONTIN) 300 MG capsule Take 300 mg by mouth 2 (two) times daily.    Yes [provider]  insulin glargine (LANTUS) 100 UNIT/ML injection Inject 100 Units into the skin at bedtime.    Yes [provider]  insulin lispro (HUMALOG) 100 UNIT/ML KiwkPen Inject 8-18 Units into the skin 3 (three) times daily with meals. Per sliding scale   Yes [provider]  isosorbide mononitrate (IMDUR) 60 MG 24 hr tablet Take 60 mg by mouth daily.   Yes [provider]  lansoprazole (PREVACID SOLUTAB) 30 MG disintegrating tablet Take 30 mg by mouth 2 (two) times daily.   Yes [provider]  lisinopril (PRINIVIL,ZESTRIL) 20 MG tablet Take 20 mg by mouth daily.    Yes [provider]  loratadine (CLARITIN) 10 MG tablet Take 10 mg by mouth daily.   Yes [provider]  Multiple Vitamins-Minerals (CENTRUM SILVER PO) Take 1 tablet by mouth every morning.   Yes [provider]  nitroGLYCERIN (NITROSTAT) 0.4 MG SL tablet Place 0.4 mg under the tongue every 5 (five) minutes as needed for chest pain.   Yes [provider]  potassium chloride (K-DUR) 10 MEQ tablet Take 1 tablet (10 mEq total) by mouth daily. 04/21/17  Yes Theodoro [name], MD  ranolazine (RANEXA) 500 MG 12 hr tablet Take 500 mg by mouth 2 (two) times daily.   Yes [provider]  spironolactone (ALDACTONE) 25 MG tablet Take 12.5 mg by mouth daily.   Yes [provider]  sucralfate (CARAFATE) 1 g tablet Take 1 g by mouth 2 (two) times daily.   Yes [provider]  tamsulosin (FLOMAX) 0.4 MG CAPS capsule Take 0.4 mg by mouth daily after supper.   Yes [provider]  torsemide (DEMADEX) 20 MG tablet Take 40 mg by mouth daily. May take 1-2 additional tablets as needed for fluid   Yes [provider]    Allergies Benadryl [diphenhydramine]; Doxycycline; and Lopid [gemfibrozil]  Family History  Problem Relation Age of Onset  . CAD Mother   . Cancer Mother   . Diabetes Mother   . Alzheimer's disease Father   . Cancer Father   . Heart disease Father     Social History Social History  Substance Use Topics  . Smoking status: Former Smoker    Packs/day: 2.00    Years: 30.00    Quit date: 12/03/1996  . Smokeless tobacco: Never Used  . Alcohol use No    Review of Systems  Constitutional: No fever/chills. Eyes: No visual changes. ENT: No  sore throat. Cardiovascular: Positive for chest pain. Respiratory: Positive for shortness of breath. Gastrointestinal: No abdominal pain.  No nausea, no vomiting.  No diarrhea.  No constipation. Genitourinary: Negative for dysuria. Musculoskeletal: Negative for back pain. Skin: Negative for rash. Neurological: Negative for headaches, focal weakness or numbness.   ____________________________________________   PHYSICAL EXAM:  VITAL SIGNS: ED Triage Vitals  Enc Vitals Group     BP      Pulse      Resp      Temp      Temp src      SpO2      Weight      Height      Head Circumference      Peak Flow      Pain Score      Pain Loc      Pain Edu?      Excl. in [place]?     Constitutional: Alert and oriented. Well appearing and in no acute distress. Eyes: Conjunctivae are normal. PERRL. EOMI. Head: Atraumatic. Nose: No congestion/rhinnorhea. Mouth/Throat: Mucous membranes are moist.  Oropharynx non-erythematous. Neck: No stridor.   Cardiovascular: Normal rate,  regular rhythm. Grossly normal heart sounds.  Good peripheral circulation. Respiratory: Normal respiratory effort.  No retractions. Lungs scattered wheezing. Gastrointestinal: Soft and nontender. No distention. No abdominal bruits. No CVA tenderness. Musculoskeletal: No lower extremity tenderness nor edema.  No joint effusions. Neurologic:  Normal speech and language. No gross focal neurologic deficits are appreciated. No gait instability. Skin:  Skin is warm, dry and intact. No rash noted. Psychiatric: Mood and affect are normal. Speech and behavior are normal.  ____________________________________________   LABS (all labs ordered are listed, but only abnormal results are displayed)  Labs Reviewed  CBC WITH DIFFERENTIAL/PLATELET - Abnormal; Notable for the following:       Result Value   RBC 3.24 (*)    Hemoglobin 9.6 (*)    HCT 28.8 (*)    Lymphs Abs 0.7 (*)    All other components within normal limits  COMPREHENSIVE METABOLIC PANEL - Abnormal; Notable for the following:    Potassium 2.9 (*)    Glucose, Bld 122 (*)    BUN 23 (*)    Creatinine, Ser 1.48 (*)    Calcium 7.9 (*)    GFR calc non Af Amer 46 (*)    GFR calc Af Amer 53 (*)    All other components within normal limits  BRAIN NATRIURETIC PEPTIDE - Abnormal; Notable for the following:    B Natriuretic Peptide 454.0 (*)    All other components within normal limits  TROPONIN I - Abnormal; Notable for the following:    Troponin I 0.10 (*)    All other components within normal limits   ____________________________________________  EKG  ED ECG REPORT I, SUNG,JADE J, the attending physician, personally viewed and interpreted this ECG.   Date: 07/16/2017  EKG Time: 2327  Rate: 75  Rhythm: normal EKG, normal sinus rhythm  Axis: Paced  Intervals:none  ST&T Change: Paced  ____________________________________________  RADIOLOGY  Dg Chest Port 1 View  Result Date: 07/16/2017 CLINICAL DATA:  Acute onset of  shortness of breath. Initial encounter. EXAM: PORTABLE CHEST 1 VIEW COMPARISON:  Chest radiograph performed 06/30/2017 FINDINGS: The lungs are well-aerated. Mild vascular congestion is noted. Increased interstitial markings raise concern for mild interstitial edema. There is no evidence of pleural effusion or pneumothorax. The cardiomediastinal silhouette is borderline enlarged. The patient is status post median sternotomy, with  evidence of prior CABG. A pacemaker/AICD is noted overlying the left chest wall, with leads ending overlying the right atrium and right ventricle. No acute osseous abnormalities are seen. IMPRESSION: Mild vascular congestion and borderline cardiomegaly. Increased interstitial markings raise concern for mild interstitial edema. Electronically Signed   By: Garald Balding M.D.   On: 07/16/2017 23:54    ____________________________________________   PROCEDURES  Procedure(s) performed: None  Procedures  Critical Care performed:   CRITICAL CARE Performed by: Paulette Blanch   Total critical care time: 30 minutes  Critical care time was exclusive of separately billable procedures and treating other patients.  Critical care was necessary to treat or prevent imminent or life-threatening deterioration.  Critical care was time spent personally by me on the following activities: development of treatment plan with patient and/or surrogate as well as nursing, discussions with consultants, evaluation of patient's response to treatment, examination of patient, obtaining history from patient or surrogate, ordering and performing treatments and interventions, ordering and review of laboratory studies, ordering and review of radiographic studies, pulse oximetry and re-evaluation of patient's condition.  ____________________________________________   INITIAL IMPRESSION / ASSESSMENT AND PLAN / ED COURSE  Pertinent labs & imaging results that were available during my care of the patient  were reviewed by me and considered in my medical decision making (see chart for details).  71 year old male with COPD and CHF who presents with shortness of breath. Room air saturations 95%. Scattered wheezing noted on exam. Will initiate treatment with IV Solu-Medrol and DuoNeb. Obtain screening lab work including BNP and troponin. Chest x-ray to evaluate for CHF; will reassess.  Clinical Course as of Jul 17 112  Sun Jul 17, 2017  0111 Wheezing improved. Updated patient and spouse of laboratory and imaging results. Aspirin and nitroglycerin paste ordered. Potassium repleted. Patient took extra torsemide at home; has not yet urinated. Will attempt to urinate now. If he is unable to urinate or has low yield, will consider a dose of IV Bumex. Will discuss with hospitalist to evaluate patient in the emergency department for admission.  [JS]    Clinical Course User Index [JS] Paulette Blanch, MD     ____________________________________________   FINAL CLINICAL IMPRESSION(S) / ED DIAGNOSES  Final diagnoses:  COPD exacerbation ([place])  Shortness of breath  Elevated troponin  Acute on chronic congestive heart failure, unspecified heart failure type ([place])  Hypokalemia      NEW MEDICATIONS STARTED DURING THIS VISIT:  New Prescriptions   No medications on file     Note:  This document was prepared using Dragon voice recognition software and may include unintentional dictation errors.    Paulette Blanch, MD 07/17/17 [id number]

## 2017-07-17 DIAGNOSIS — Z951 Presence of aortocoronary bypass graft: Secondary | ICD-10-CM | POA: Diagnosis not present

## 2017-07-17 DIAGNOSIS — Z9114 Patient's other noncompliance with medication regimen: Secondary | ICD-10-CM | POA: Diagnosis not present

## 2017-07-17 DIAGNOSIS — E1142 Type 2 diabetes mellitus with diabetic polyneuropathy: Secondary | ICD-10-CM | POA: Diagnosis present

## 2017-07-17 DIAGNOSIS — Z95810 Presence of automatic (implantable) cardiac defibrillator: Secondary | ICD-10-CM | POA: Diagnosis not present

## 2017-07-17 DIAGNOSIS — Z8546 Personal history of malignant neoplasm of prostate: Secondary | ICD-10-CM | POA: Diagnosis not present

## 2017-07-17 DIAGNOSIS — I25119 Atherosclerotic heart disease of native coronary artery with unspecified angina pectoris: Secondary | ICD-10-CM | POA: Diagnosis present

## 2017-07-17 DIAGNOSIS — Z8249 Family history of ischemic heart disease and other diseases of the circulatory system: Secondary | ICD-10-CM | POA: Diagnosis not present

## 2017-07-17 DIAGNOSIS — R0602 Shortness of breath: Secondary | ICD-10-CM | POA: Diagnosis present

## 2017-07-17 DIAGNOSIS — Z6836 Body mass index (BMI) 36.0-36.9, adult: Secondary | ICD-10-CM | POA: Diagnosis not present

## 2017-07-17 DIAGNOSIS — N179 Acute kidney failure, unspecified: Secondary | ICD-10-CM | POA: Diagnosis present

## 2017-07-17 DIAGNOSIS — E669 Obesity, unspecified: Secondary | ICD-10-CM | POA: Diagnosis present

## 2017-07-17 DIAGNOSIS — I5023 Acute on chronic systolic (congestive) heart failure: Secondary | ICD-10-CM | POA: Diagnosis present

## 2017-07-17 DIAGNOSIS — E785 Hyperlipidemia, unspecified: Secondary | ICD-10-CM | POA: Diagnosis present

## 2017-07-17 DIAGNOSIS — E876 Hypokalemia: Secondary | ICD-10-CM | POA: Diagnosis present

## 2017-07-17 DIAGNOSIS — D638 Anemia in other chronic diseases classified elsewhere: Secondary | ICD-10-CM | POA: Diagnosis present

## 2017-07-17 DIAGNOSIS — Z9111 Patient's noncompliance with dietary regimen: Secondary | ICD-10-CM | POA: Diagnosis not present

## 2017-07-17 DIAGNOSIS — G4733 Obstructive sleep apnea (adult) (pediatric): Secondary | ICD-10-CM | POA: Diagnosis present

## 2017-07-17 DIAGNOSIS — J441 Chronic obstructive pulmonary disease with (acute) exacerbation: Secondary | ICD-10-CM | POA: Diagnosis present

## 2017-07-17 DIAGNOSIS — I255 Ischemic cardiomyopathy: Secondary | ICD-10-CM | POA: Diagnosis present

## 2017-07-17 DIAGNOSIS — E7800 Pure hypercholesterolemia, unspecified: Secondary | ICD-10-CM | POA: Diagnosis present

## 2017-07-17 DIAGNOSIS — I110 Hypertensive heart disease with heart failure: Secondary | ICD-10-CM | POA: Diagnosis present

## 2017-07-17 DIAGNOSIS — K219 Gastro-esophageal reflux disease without esophagitis: Secondary | ICD-10-CM | POA: Diagnosis present

## 2017-07-17 DIAGNOSIS — Z87891 Personal history of nicotine dependence: Secondary | ICD-10-CM | POA: Diagnosis not present

## 2017-07-17 DIAGNOSIS — I252 Old myocardial infarction: Secondary | ICD-10-CM | POA: Diagnosis not present

## 2017-07-17 DIAGNOSIS — I248 Other forms of acute ischemic heart disease: Secondary | ICD-10-CM | POA: Diagnosis present

## 2017-07-17 LAB — BASIC METABOLIC PANEL
Anion gap: 12 (ref 5–15)
BUN: 27 mg/dL — AB (ref 6–20)
CHLORIDE: 104 mmol/L (ref 101–111)
CO2: 23 mmol/L (ref 22–32)
CREATININE: 1.41 mg/dL — AB (ref 0.61–1.24)
Calcium: 8.1 mg/dL — ABNORMAL LOW (ref 8.9–10.3)
GFR calc Af Amer: 56 mL/min — ABNORMAL LOW (ref >60)
GFR calc non Af Amer: 48 mL/min — ABNORMAL LOW (ref >60)
GLUCOSE: 260 mg/dL — AB (ref 65–99)
POTASSIUM: 3.4 mmol/L — AB (ref 3.5–5.1)
Sodium: 139 mmol/L (ref 135–145)

## 2017-07-17 LAB — COMPREHENSIVE METABOLIC PANEL
ALBUMIN: 3.7 g/dL (ref 3.5–5.0)
ALK PHOS: 69 U/L (ref 38–126)
ALT: 19 U/L (ref 17–63)
ANION GAP: 11 (ref 5–15)
AST: 25 U/L (ref 15–41)
BUN: 23 mg/dL — ABNORMAL HIGH (ref 6–20)
CALCIUM: 7.9 mg/dL — AB (ref 8.9–10.3)
CHLORIDE: 106 mmol/L (ref 101–111)
CO2: 25 mmol/L (ref 22–32)
Creatinine, Ser: 1.48 mg/dL — ABNORMAL HIGH (ref 0.61–1.24)
GFR calc non Af Amer: 46 mL/min — ABNORMAL LOW (ref >60)
GFR, EST AFRICAN AMERICAN: 53 mL/min — AB (ref >60)
GLUCOSE: 122 mg/dL — AB (ref 65–99)
POTASSIUM: 2.9 mmol/L — AB (ref 3.5–5.1)
SODIUM: 142 mmol/L (ref 135–145)
Total Bilirubin: 0.6 mg/dL (ref 0.3–1.2)
Total Protein: 7.2 g/dL (ref 6.5–8.1)

## 2017-07-17 LAB — GLUCOSE, CAPILLARY
Glucose-Capillary: 137 mg/dL — ABNORMAL HIGH (ref 65–99)
Glucose-Capillary: 181 mg/dL — ABNORMAL HIGH (ref 65–99)
Glucose-Capillary: 195 mg/dL — ABNORMAL HIGH (ref 65–99)
Glucose-Capillary: 210 mg/dL — ABNORMAL HIGH (ref 65–99)
Glucose-Capillary: 209 mg/dL — ABNORMAL HIGH (ref 65–99)

## 2017-07-17 LAB — TROPONIN I
TROPONIN I: 0.04 ng/mL — AB (ref <0.03)
TROPONIN I: 0.1 ng/mL — AB (ref <0.03)
Troponin I: 0.04 ng/mL (ref <0.03)

## 2017-07-17 LAB — MAGNESIUM: Magnesium: 1.6 mg/dL — ABNORMAL LOW (ref 1.7–2.4)

## 2017-07-17 LAB — TSH: TSH: 1.208 u[IU]/mL (ref 0.350–4.500)

## 2017-07-17 LAB — BRAIN NATRIURETIC PEPTIDE: B Natriuretic Peptide: 454 pg/mL — ABNORMAL HIGH (ref 0.0–100.0)

## 2017-07-17 MED ORDER — NITROGLYCERIN 0.4 MG SL SUBL: 0.4000 mg | SUBLINGUAL_TABLET | SUBLINGUAL | Status: DC | PRN

## 2017-07-17 MED ORDER — ACETAMINOPHEN 325 MG PO TABS: 650.0000 mg | ORAL_TABLET | Freq: Four times a day (QID) | ORAL | Status: DC | PRN

## 2017-07-17 MED ORDER — ISOSORBIDE MONONITRATE ER 60 MG PO TB24
60.0000 mg | ORAL_TABLET | Freq: Every day | ORAL | Status: DC
  Administered 2017-07-17 – 2017-07-18 (×2): 60 mg via ORAL
  Filled 2017-07-17 (×2): qty 1

## 2017-07-17 MED ORDER — ASPIRIN 81 MG PO CHEW
CHEWABLE_TABLET | ORAL | Status: AC
  Filled 2017-07-17: qty 4

## 2017-07-17 MED ORDER — POTASSIUM CHLORIDE CRYS ER 20 MEQ PO TBCR
40.0000 meq | EXTENDED_RELEASE_TABLET | Freq: Once | ORAL | Status: AC
  Administered 2017-07-17: 40 meq via ORAL

## 2017-07-17 MED ORDER — ENOXAPARIN SODIUM 40 MG/0.4ML ~~LOC~~ SOLN
40.0000 mg | SUBCUTANEOUS | Status: DC
  Administered 2017-07-17: 40 mg via SUBCUTANEOUS
  Filled 2017-07-17: qty 0.4

## 2017-07-17 MED ORDER — IPRATROPIUM-ALBUTEROL 0.5-2.5 (3) MG/3ML IN SOLN
3.0000 mL | Freq: Two times a day (BID) | RESPIRATORY_TRACT | Status: DC
  Administered 2017-07-17 – 2017-07-18 (×3): 3 mL via RESPIRATORY_TRACT
  Filled 2017-07-17 (×3): qty 3

## 2017-07-17 MED ORDER — ATORVASTATIN CALCIUM 20 MG PO TABS
80.0000 mg | ORAL_TABLET | Freq: Every evening | ORAL | Status: DC
  Administered 2017-07-17: 80 mg via ORAL
  Filled 2017-07-17: qty 4

## 2017-07-17 MED ORDER — DOCUSATE SODIUM 100 MG PO CAPS
100.0000 mg | ORAL_CAPSULE | Freq: Two times a day (BID) | ORAL | Status: DC
  Administered 2017-07-17 – 2017-07-18 (×3): 100 mg via ORAL
  Filled 2017-07-17 (×3): qty 1

## 2017-07-17 MED ORDER — PREDNISONE 20 MG PO TABS: 30.0000 mg | ORAL_TABLET | Freq: Every day | ORAL | Status: DC

## 2017-07-17 MED ORDER — ASPIRIN 81 MG PO CHEW
324.0000 mg | CHEWABLE_TABLET | Freq: Once | ORAL | Status: AC
  Administered 2017-07-17: 324 mg via ORAL

## 2017-07-17 MED ORDER — PREDNISONE 50 MG PO TABS
50.0000 mg | ORAL_TABLET | Freq: Every day | ORAL | Status: AC
  Administered 2017-07-17: 50 mg via ORAL
  Filled 2017-07-17: qty 1

## 2017-07-17 MED ORDER — FLUOXETINE HCL 20 MG PO CAPS
20.0000 mg | ORAL_CAPSULE | Freq: Every day | ORAL | Status: DC
  Administered 2017-07-17 – 2017-07-18 (×2): 20 mg via ORAL
  Filled 2017-07-17 (×2): qty 1

## 2017-07-17 MED ORDER — PANTOPRAZOLE SODIUM 40 MG PO TBEC
40.0000 mg | DELAYED_RELEASE_TABLET | Freq: Two times a day (BID) | ORAL | Status: DC
  Administered 2017-07-17 – 2017-07-18 (×3): 40 mg via ORAL
  Filled 2017-07-17 (×3): qty 1

## 2017-07-17 MED ORDER — IPRATROPIUM-ALBUTEROL 18-103 MCG/ACT IN AERO: 1.0000 | INHALATION_SPRAY | Freq: Two times a day (BID) | RESPIRATORY_TRACT | Status: DC

## 2017-07-17 MED ORDER — TORSEMIDE 20 MG PO TABS
40.0000 mg | ORAL_TABLET | Freq: Every day | ORAL | Status: DC
  Administered 2017-07-17 – 2017-07-18 (×2): 40 mg via ORAL
  Filled 2017-07-17 (×2): qty 2

## 2017-07-17 MED ORDER — FERROUS SULFATE 325 (65 FE) MG PO TABS
325.0000 mg | ORAL_TABLET | Freq: Two times a day (BID) | ORAL | Status: DC
  Administered 2017-07-17 – 2017-07-18 (×3): 325 mg via ORAL
  Filled 2017-07-17 (×2): qty 1

## 2017-07-17 MED ORDER — INSULIN GLARGINE 100 UNIT/ML ~~LOC~~ SOLN
60.0000 [IU] | Freq: Every day | SUBCUTANEOUS | Status: DC
  Administered 2017-07-17: 60 [IU] via SUBCUTANEOUS
  Filled 2017-07-17 (×2): qty 0.6

## 2017-07-17 MED ORDER — CARVEDILOL 6.25 MG PO TABS
6.2500 mg | ORAL_TABLET | Freq: Two times a day (BID) | ORAL | Status: DC
  Administered 2017-07-17 – 2017-07-18 (×3): 6.25 mg via ORAL
  Filled 2017-07-17 (×3): qty 1

## 2017-07-17 MED ORDER — ONDANSETRON HCL 4 MG PO TABS: 4.0000 mg | ORAL_TABLET | Freq: Four times a day (QID) | ORAL | Status: DC | PRN

## 2017-07-17 MED ORDER — CLOPIDOGREL BISULFATE 75 MG PO TABS
75.0000 mg | ORAL_TABLET | ORAL | Status: DC
  Administered 2017-07-17 – 2017-07-18 (×2): 75 mg via ORAL
  Filled 2017-07-17 (×2): qty 1

## 2017-07-17 MED ORDER — ACETAMINOPHEN 650 MG RE SUPP: 650.0000 mg | Freq: Four times a day (QID) | RECTAL | Status: DC | PRN

## 2017-07-17 MED ORDER — FUROSEMIDE 10 MG/ML IJ SOLN
INTRAMUSCULAR | Status: AC
  Filled 2017-07-17: qty 8

## 2017-07-17 MED ORDER — POTASSIUM CHLORIDE CRYS ER 10 MEQ PO TBCR
10.0000 meq | EXTENDED_RELEASE_TABLET | Freq: Every day | ORAL | Status: DC
  Administered 2017-07-17 – 2017-07-18 (×2): 10 meq via ORAL
  Filled 2017-07-17 (×2): qty 1

## 2017-07-17 MED ORDER — TAMSULOSIN HCL 0.4 MG PO CAPS
0.4000 mg | ORAL_CAPSULE | Freq: Every day | ORAL | Status: DC
  Administered 2017-07-17: 0.4 mg via ORAL
  Filled 2017-07-17: qty 1

## 2017-07-17 MED ORDER — SUCRALFATE 1 G PO TABS
1.0000 g | ORAL_TABLET | Freq: Two times a day (BID) | ORAL | Status: DC
  Administered 2017-07-17 – 2017-07-18 (×3): 1 g via ORAL
  Filled 2017-07-17 (×3): qty 1

## 2017-07-17 MED ORDER — COENZYME Q10 30 MG PO CAPS: 30.0000 mg | ORAL_CAPSULE | Freq: Every day | ORAL | Status: DC

## 2017-07-17 MED ORDER — ONDANSETRON HCL 4 MG/2ML IJ SOLN: 4.0000 mg | Freq: Four times a day (QID) | INTRAMUSCULAR | Status: DC | PRN

## 2017-07-17 MED ORDER — MAGNESIUM SULFATE 2 GM/50ML IV SOLN
INTRAVENOUS | Status: AC
  Filled 2017-07-17: qty 50

## 2017-07-17 MED ORDER — INSULIN ASPART 100 UNIT/ML ~~LOC~~ SOLN
0.0000 [IU] | Freq: Three times a day (TID) | SUBCUTANEOUS | Status: DC
  Administered 2017-07-17 (×2): 4 [IU] via SUBCUTANEOUS
  Administered 2017-07-17 – 2017-07-18 (×2): 7 [IU] via SUBCUTANEOUS
  Filled 2017-07-17 (×4): qty 1

## 2017-07-17 MED ORDER — ALBUTEROL SULFATE (2.5 MG/3ML) 0.083% IN NEBU: 2.5000 mg | INHALATION_SOLUTION | RESPIRATORY_TRACT | Status: DC | PRN

## 2017-07-17 MED ORDER — PREDNISONE 20 MG PO TABS
40.0000 mg | ORAL_TABLET | Freq: Every day | ORAL | Status: AC
  Administered 2017-07-18: 40 mg via ORAL
  Filled 2017-07-17: qty 2

## 2017-07-17 MED ORDER — GABAPENTIN 300 MG PO CAPS
300.0000 mg | ORAL_CAPSULE | Freq: Two times a day (BID) | ORAL | Status: DC
  Administered 2017-07-17 – 2017-07-18 (×3): 300 mg via ORAL
  Filled 2017-07-17 (×3): qty 1

## 2017-07-17 MED ORDER — RANOLAZINE ER 500 MG PO TB12
500.0000 mg | ORAL_TABLET | Freq: Two times a day (BID) | ORAL | Status: DC
  Administered 2017-07-17 – 2017-07-18 (×3): 500 mg via ORAL
  Filled 2017-07-17 (×4): qty 1

## 2017-07-17 MED ORDER — LORATADINE 10 MG PO TABS
10.0000 mg | ORAL_TABLET | Freq: Every day | ORAL | Status: DC
  Administered 2017-07-17 – 2017-07-18 (×2): 10 mg via ORAL
  Filled 2017-07-17 (×2): qty 1

## 2017-07-17 MED ORDER — PREDNISONE 20 MG PO TABS: 20.0000 mg | ORAL_TABLET | Freq: Every day | ORAL | Status: DC

## 2017-07-17 MED ORDER — IPRATROPIUM-ALBUTEROL 0.5-2.5 (3) MG/3ML IN SOLN: 3.0000 mL | Freq: Two times a day (BID) | RESPIRATORY_TRACT | Status: DC

## 2017-07-17 MED ORDER — NITROGLYCERIN 2 % TD OINT
TOPICAL_OINTMENT | TRANSDERMAL | Status: AC
  Filled 2017-07-17: qty 1

## 2017-07-17 MED ORDER — PREDNISONE 10 MG PO TABS: 10.0000 mg | ORAL_TABLET | Freq: Every day | ORAL | Status: DC

## 2017-07-17 MED ORDER — POTASSIUM CHLORIDE CRYS ER 20 MEQ PO TBCR
EXTENDED_RELEASE_TABLET | ORAL | Status: AC
  Filled 2017-07-17: qty 2

## 2017-07-17 MED ORDER — CALCIUM CARBONATE ANTACID 500 MG PO CHEW: 1.0000 | CHEWABLE_TABLET | ORAL | Status: DC | PRN

## 2017-07-17 MED ORDER — MAGNESIUM OXIDE 400 (241.3 MG) MG PO TABS: 400.0000 mg | ORAL_TABLET | ORAL | Status: DC | PRN

## 2017-07-17 MED ORDER — LISINOPRIL 20 MG PO TABS
20.0000 mg | ORAL_TABLET | Freq: Every day | ORAL | Status: DC
  Administered 2017-07-17 – 2017-07-18 (×2): 20 mg via ORAL
  Filled 2017-07-17 (×2): qty 1

## 2017-07-17 MED ORDER — LANSOPRAZOLE 30 MG PO TBDP: 30.0000 mg | ORAL_TABLET | Freq: Two times a day (BID) | ORAL | Status: DC

## 2017-07-17 MED ORDER — FINASTERIDE 5 MG PO TABS
5.0000 mg | ORAL_TABLET | Freq: Every day | ORAL | Status: DC
  Administered 2017-07-17: 5 mg via ORAL
  Filled 2017-07-17: qty 1

## 2017-07-17 MED ORDER — MAGNESIUM SULFATE 2 GM/50ML IV SOLN
2.0000 g | Freq: Once | INTRAVENOUS | Status: AC
  Administered 2017-07-17: 2 g via INTRAVENOUS

## 2017-07-17 MED ORDER — AZITHROMYCIN 250 MG PO TABS
250.0000 mg | ORAL_TABLET | Freq: Every day | ORAL | Status: DC
  Administered 2017-07-18: 250 mg via ORAL
  Filled 2017-07-17: qty 1

## 2017-07-17 MED ORDER — SPIRONOLACTONE 25 MG PO TABS
12.5000 mg | ORAL_TABLET | Freq: Every day | ORAL | Status: DC
  Administered 2017-07-17 – 2017-07-18 (×2): 12.5 mg via ORAL
  Filled 2017-07-17 (×2): qty 1

## 2017-07-17 MED ORDER — AZITHROMYCIN 250 MG PO TABS
500.0000 mg | ORAL_TABLET | Freq: Every day | ORAL | Status: AC
  Administered 2017-07-17: 500 mg via ORAL
  Filled 2017-07-17: qty 2

## 2017-07-17 MED ORDER — NITROGLYCERIN 2 % TD OINT
0.5000 [in_us] | TOPICAL_OINTMENT | Freq: Once | TRANSDERMAL | Status: AC
  Administered 2017-07-17: 0.5 [in_us] via TOPICAL

## 2017-07-17 MED ORDER — MOMETASONE FURO-FORMOTEROL FUM 100-5 MCG/ACT IN AERO
2.0000 | INHALATION_SPRAY | Freq: Two times a day (BID) | RESPIRATORY_TRACT | Status: DC
  Administered 2017-07-17 – 2017-07-18 (×3): 2 via RESPIRATORY_TRACT
  Filled 2017-07-17: qty 8.8

## 2017-07-17 MED ORDER — FUROSEMIDE 10 MG/ML IJ SOLN
80.0000 mg | Freq: Once | INTRAMUSCULAR | Status: AC
  Administered 2017-07-17: 80 mg via INTRAVENOUS

## 2017-07-17 MED ORDER — ADULT MULTIVITAMIN W/MINERALS CH
1.0000 | ORAL_TABLET | ORAL | Status: DC
  Administered 2017-07-17 – 2017-07-18 (×2): 1 via ORAL
  Filled 2017-07-17 (×2): qty 1

## 2017-07-17 NOTE — Progress Notes (Signed)
Pt placed on [facility] C-3 CPAP. CPAP plugged into red outlet. 5L O2 in line per home regimen

## 2017-07-17 NOTE — ED Notes (Signed)
3-beat run of PVCs noted on the monitor; monitor reading printed and attached to pt's chart; MD notified.

## 2017-07-17 NOTE — ED Notes (Signed)
5-beat run of PVCs noted, paper printed and put into pt's chart; MD notified.

## 2017-07-17 NOTE — Progress Notes (Signed)
[facility] [place] NAME: [name]    MR#:  [id number]  DATE OF BIRTH:  1945-12-26  SUBJECTIVE:  CHIEF COMPLAINT:  Patient's shortness of breath is better denies any chest pain. Seems noncompliant with the low-salt diet  REVIEW OF SYSTEMS:  CONSTITUTIONAL: No fever, fatigue or weakness.  EYES: No blurred or double vision.  EARS, NOSE, AND THROAT: No tinnitus or ear pain.  RESPIRATORY: No cough, shortness of breath, wheezing or hemoptysis.  CARDIOVASCULAR: No chest pain, orthopnea, edema.  GASTROINTESTINAL: No nausea, vomiting, diarrhea or abdominal pain.  GENITOURINARY: No dysuria, hematuria.  ENDOCRINE: No polyuria, nocturia,  HEMATOLOGY: No anemia, easy bruising or bleeding SKIN: No rash or lesion. MUSCULOSKELETAL: No joint pain or arthritis.   NEUROLOGIC: No tingling, numbness, weakness.  PSYCHIATRY: No anxiety or depression.   DRUG ALLERGIES:   Allergies  Allergen Reactions  . Benadryl [Diphenhydramine] Other (See Comments)    " Hyperactivity"  . Doxycycline Swelling    Pt went into pulmonary edema.  . Lopid [Gemfibrozil] Swelling    "I gain 1 pound a day for 30 days."    VITALS:  Blood pressure (!) 128/50, pulse 71, temperature 97.9 F (36.6 C), temperature source Oral, resp. rate 16, height 5\' 8"  (1.727 m), weight 109.8 kg (242 lb 1.6 oz), SpO2 100 %.  PHYSICAL EXAMINATION:  GENERAL:  71 y.o.-year-old patient lying in the bed with no acute distress.  EYES: Pupils equal, round, reactive to light and accommodation. No scleral icterus. Extraocular muscles intact.  HEENT: Head atraumatic, normocephalic. Oropharynx and nasopharynx clear.  NECK:  Supple, no jugular venous distention. No thyroid enlargement, no tenderness.  LUNGS: Normal breath sounds bilaterally, no wheezing, rales,rhonchi or crepitation. No use of accessory muscles of respiration.  CARDIOVASCULAR: S1, S2 normal. No murmurs, rubs, or gallops.   ABDOMEN: Soft, nontender, nondistended. Bowel sounds present. No organomegaly or mass.  EXTREMITIES: No pedal edema, cyanosis, or clubbing.  NEUROLOGIC: Cranial nerves II through XII are intact. Muscle strength 5/5 in all extremities. Sensation intact. Gait not checked.  PSYCHIATRIC: The patient is alert and oriented x 3.  SKIN: No obvious rash, lesion, or ulcer.    LABORATORY PANEL:   CBC  Recent Labs Lab 07/16/17 2337  WBC 7.1  HGB 9.6*  HCT 28.8*  PLT 237   ------------------------------------------------------------------------------------------------------------------  Chemistries   Recent Labs Lab 07/16/17 2337 07/17/17 0943  NA 142 139  K 2.9* 3.4*  CL 106 104  CO2 25 23  GLUCOSE 122* 260*  BUN 23* 27*  CREATININE 1.48* 1.41*  CALCIUM 7.9* 8.1*  MG  --  1.6*  AST 25  --   ALT 19  --   ALKPHOS 69  --   BILITOT 0.6  --    ------------------------------------------------------------------------------------------------------------------  Cardiac Enzymes  Recent Labs Lab 07/16/17 2337  TROPONINI 0.10*   ------------------------------------------------------------------------------------------------------------------  RADIOLOGY:  Dg Chest Port 1 View  Result Date: 07/16/2017 CLINICAL DATA:  Acute onset of shortness of breath. Initial encounter. EXAM: PORTABLE CHEST 1 VIEW COMPARISON:  Chest radiograph performed 06/30/2017 FINDINGS: The lungs are well-aerated. Mild vascular congestion is noted. Increased interstitial markings raise concern for mild interstitial edema. There is no evidence of pleural effusion or pneumothorax. The cardiomediastinal silhouette is borderline enlarged. The patient is status post median sternotomy, with evidence of prior CABG. A pacemaker/AICD is noted overlying the left chest wall, with leads ending overlying the right atrium and right ventricle. No acute osseous abnormalities are seen.  IMPRESSION: Mild vascular congestion and  borderline cardiomegaly. Increased interstitial markings raise concern for mild interstitial edema. Electronically Signed   By: Garald Balding M.D.   On: 07/16/2017 23:54    EKG:   Orders placed or performed during the hospital encounter of 07/16/17  . ED EKG  . ED EKG  . EKG 12-Lead  . EKG 12-Lead  . EKG 12-Lead  . EKG 12-Lead    ASSESSMENT AND PLAN:   This is a 71 year old male Presenting with shortness of breath  1. Congestive heart failure: Acute on chronic; systolic.  pt has recieved Lasix 80 mg IV and we will continue his oral torsemide per home dosing schedule.  Cardiac diet, provide diabetic education as patient seems to be noncompliant with the low-salt diet Monitor intake and output Outpatient CHF clinic follow-up as patient was just admitted to hospital with similar complaint on July 27 Consult cardiology-[place], patient is Dr. Nehemiah Massed, not seen by him in the past 6 months  2. CAD:  elevated troponin at 0.10 secondary to demand ischemia.  Continue to follow cardiac biomarkers. Monitor telemetry.  Consult [facility] cardiology   Continue aspirin and Plavix as well as Imdur  3. Hypertension: Controlled; continue Coreg and lisinopril  4. Diabetes mellitus type 2: Continue basal insulin therapy adjusted for hospital diet. Sliding scale insulin while hospitalized. Hold oral-glycemic agents. Neurontin for neuropathy  5. COPD: Continue inhaled corticosteroid. Albuterol as needed. Taper steroids.Azithromycin for anti-inflammatory effect.  6. Hyperlipidemia: Continue statin therapy  7. Prostate cancer: Continue finasteride and tamsulosin  8. DVT prophylaxis: Lovenox 9. GI prophylaxis: Pantoprazole per home regimen    All the records are reviewed and case discussed with Care Management/Social Workerr. Management plans discussed with the patient, family and they are in agreement.  CODE STATUS: fc   TOTAL TIME TAKING CARE OF THIS PATIENT: 35  minutes.   POSSIBLE D/C IN 2   DAYS, DEPENDING ON CLINICAL CONDITION.  Note: This dictation was prepared with Dragon dictation along with smaller phrase technology. Any transcriptional errors that result from this process are unintentional.   Nicholes Mango M.D on 07/17/2017 at 12:48 PM  Between 7am to 6pm - Pager - [phone] After 6pm go to [email or web address] - password [email or web address] [facility]  Office  [phone]  CC: Primary care physician; [name], MD

## 2017-07-17 NOTE — Progress Notes (Signed)
PHARMACIST - PHYSICIAN ORDER COMMUNICATION  CONCERNING: P&T Medication Policy on Herbal Medications  DESCRIPTION:  This patient's order for:  Coenzyme Q-10  has been noted.  This product(s) is classified as an "herbal" or natural product. Due to a lack of definitive safety studies or FDA approval, nonstandard manufacturing practices, plus the potential risk of unknown drug-drug interactions while on inpatient medications, the Pharmacy and Therapeutics Committee does not permit the use of "herbal" or natural products of this type within [place].   ACTION TAKEN: The pharmacy department is unable to verify this order at this time. Please reevaluate patient's clinical condition at discharge and address if the herbal or natural product(s) should be resumed at that time.   

## 2017-07-17 NOTE — H&P (Signed)
[name] is an 71 y.o. male.   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 The patient states that his breathing became worse this evening prior to admission. He took 2 extra doses of torsemide and used a breathing treatment and his maintenance inhalers. Now these interventions relieved the shortness of breath which eventually grew into centralized chest pressure. The patient denies nausea, vomiting or diaphoresis. He also denies radiation of pain. In the emergency department Nitropaste was placed on his chest and the patient received Solu-Medrol as some of his respiratory distress was thought to be secondary to COPD exacerbation. This relieved his chest pain almost entirely. He does not have an oxygen requirement. However, due to his increased work of breathing and elevated troponin the emergency department staff called the hospitalist service for admission.  Past Medical History:  Diagnosis Date  . Anemia   . Cancer ([place]) 12/2013   prostate  . 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 Cardiomyopathy, ischemic   . CHF (congestive heart failure) ([place])   . COPD (chronic obstructive pulmonary disease) ([place])   . Diabetes mellitus without complication ([place])   . GERD (gastroesophageal reflux disease)   . Hypercholesteremia   . Hypertension   . Myocardial infarction ([place]) [id number]  . Shortness of breath dyspnea     Past Surgical History:  Procedure Laterality Date  . CARDIAC CATHETERIZATION N/A 02/02/2016   Procedure: Left Heart Cath and Coronary Angiography;  Surgeon: [name], MD;  Location: [facility];  Service: Cardiovascular;  Laterality: N/A;  . CORONARY ANGIOPLASTY WITH STENT PLACEMENT    . CORONARY ARTERY BYPASS GRAFT  11/24/2010  .  IMPLANTABLE CARDIOVERTER DEFIBRILLATOR (ICD) GENERATOR CHANGE Left 12/12/2015   Procedure: DUAL LEAD PLACEMENT CARDIAC DIFIBRILLATOR;  Surgeon: [name], MD;  Location: [facility] ORS;  Service: Cardiovascular;  Laterality: Left;  . TONSILLECTOMY      Family History  Problem Relation Age of Onset  . CAD Mother   . Cancer Mother   . Diabetes Mother   . Alzheimer's disease Father   . Cancer Father   . Heart disease Father    Social History:  reports that he quit smoking about 20 years ago. He has a 60.00 pack-year smoking history. He has never used smokeless tobacco. He reports that he does not drink alcohol or use drugs.  Allergies:  Allergies  Allergen Reactions  . Benadryl [Diphenhydramine] Other (See Comments)    " Hyperactivity"  . Doxycycline Swelling    Pt went into pulmonary edema.  . Lopid [Gemfibrozil] Swelling    "I gain 1 pound a day for 30 days."    Medications Prior to Admission  Medication Sig Dispense Refill  . acetaminophen (TYLENOL) 325 MG tablet Take 2 tablets (650 mg total) by mouth every 6 (six) hours as needed for mild pain (or Fever >/= 101).    Marland Kitchen albuterol (PROVENTIL HFA;VENTOLIN HFA) 108 (90 Base) MCG/ACT inhaler Inhale 2 puffs into the lungs 4 (four) times daily as needed for wheezing or shortness of breath.     Marland Kitchen albuterol-ipratropium (COMBIVENT) 18-103 MCG/ACT inhaler Inhale 1 puff into the lungs 2 times daily at 12 noon and 4 pm.    . atorvastatin (LIPITOR) 80 MG tablet Take 1 tablet (80 mg total) by mouth every evening. 30 tablet 3  .  budesonide-formoterol (SYMBICORT) 80-4.5 MCG/ACT inhaler Inhale 2 puffs into the lungs 2 (two) times daily.    . calcium carbonate (TUMS - DOSED IN MG ELEMENTAL CALCIUM) 500 MG chewable tablet Chew 1 tablet by mouth as needed for indigestion or heartburn.    . carvedilol (COREG) 6.25 MG tablet Take 6.25 mg by mouth 2 (two) times daily with a meal.    . clopidogrel (PLAVIX) 75 MG tablet Take 75 mg by mouth every morning.     Marland Kitchen co-enzyme Q-10 30 MG capsule Take 30 mg by mouth daily.    . ferrous sulfate 325 (65 FE) MG tablet Take 325 mg by mouth 2 (two) times daily with a meal.    . finasteride (PROSCAR) 5 MG tablet Take 5 mg by mouth at bedtime.    Marland Kitchen FLUoxetine (PROZAC) 20 MG capsule Take 20 mg by mouth daily.    Marland Kitchen gabapentin (NEURONTIN) 300 MG capsule Take 300 mg by mouth 2 (two) times daily.     . insulin glargine (LANTUS) 100 UNIT/ML injection Inject 100 Units into the skin at bedtime.     . insulin lispro (HUMALOG) 100 UNIT/ML KiwkPen Inject 8-18 Units into the skin 3 (three) times daily with meals. Per sliding scale    . isosorbide mononitrate (IMDUR) 60 MG 24 hr tablet Take 60 mg by mouth daily.    . lansoprazole (PREVACID SOLUTAB) 30 MG disintegrating tablet Take 30 mg by mouth 2 (two) times daily.    Marland Kitchen lisinopril (PRINIVIL,ZESTRIL) 20 MG tablet Take 20 mg by mouth daily.     Marland Kitchen loratadine (CLARITIN) 10 MG tablet Take 10 mg by mouth daily.    . Multiple Vitamins-Minerals (CENTRUM SILVER PO) Take 1 tablet by mouth every morning.    . nitroGLYCERIN (NITROSTAT) 0.4 MG SL tablet Place 0.4 mg under the tongue every 5 (five) minutes as needed for chest pain.    . potassium chloride (K-DUR) 10 MEQ tablet Take 1 tablet (10 mEq total) by mouth daily. 30 tablet 3  . ranolazine (RANEXA) 500 MG 12 hr tablet Take 500 mg by mouth 2 (two) times daily.    Marland Kitchen spironolactone (ALDACTONE) 25 MG tablet Take 12.5 mg by mouth daily.    . sucralfate (CARAFATE) 1 g tablet Take 1 g by mouth 2 (two) times daily.    . tamsulosin (FLOMAX) 0.4 MG CAPS capsule Take 0.4 mg by mouth daily after supper.    . torsemide (DEMADEX) 20 MG tablet Take 40 mg by mouth daily. May take 1-2 additional tablets as needed for fluid      Results for orders placed or performed during the hospital encounter of 07/16/17 (from the past 48 hour(s))  CBC with Differential     Status: Abnormal   Collection Time: 07/16/17 11:37 PM  Result Value Ref Range   WBC  7.1 3.8 - 10.6 K/uL   RBC 3.24 (L) 4.40 - 5.90 MIL/uL   Hemoglobin 9.6 (L) 13.0 - 18.0 g/dL   HCT 28.8 (L) 40.0 - 52.0 %   MCV 88.9 80.0 - 100.0 fL   MCH 29.6 26.0 - 34.0 pg   MCHC 33.3 32.0 - 36.0 g/dL   RDW 14.0 11.5 - 14.5 %   Platelets 237 150 - 440 K/uL   Neutrophils Relative % 83 %   Neutro Abs 5.9 1.4 - 6.5 K/uL   Lymphocytes Relative 11 %   Lymphs Abs 0.7 (L) 1.0 - 3.6 K/uL   Monocytes Relative 5 %   Monocytes Absolute  0.4 0.2 - 1.0 K/uL   Eosinophils Relative 1 %   Eosinophils Absolute 0.1 0 - 0.7 K/uL   Basophils Relative 0 %   Basophils Absolute 0.0 0 - 0.1 K/uL  Comprehensive metabolic panel     Status: Abnormal   Collection Time: 07/16/17 11:37 PM  Result Value Ref Range   Sodium 142 135 - 145 mmol/L   Potassium 2.9 (L) 3.5 - 5.1 mmol/L   Chloride 106 101 - 111 mmol/L   CO2 25 22 - 32 mmol/L   Glucose, Bld 122 (H) 65 - 99 mg/dL   BUN 23 (H) 6 - 20 mg/dL   Creatinine, Ser 1.48 (H) 0.61 - 1.24 mg/dL   Calcium 7.9 (L) 8.9 - 10.3 mg/dL   Total Protein 7.2 6.5 - 8.1 g/dL   Albumin 3.7 3.5 - 5.0 g/dL   AST 25 15 - 41 U/L   ALT 19 17 - 63 U/L   Alkaline Phosphatase 69 38 - 126 U/L   Total Bilirubin 0.6 0.3 - 1.2 mg/dL   GFR calc non Af Amer 46 (L) >60 mL/min   GFR calc Af Amer 53 (L) >60 mL/min    Comment: (NOTE) The eGFR has been calculated using the CKD EPI equation. This calculation has not been validated in all clinical situations. eGFR's persistently <60 mL/min signify possible Chronic Kidney Disease.    Anion gap 11 5 - 15  Brain natriuretic peptide     Status: Abnormal   Collection Time: 07/16/17 11:37 PM  Result Value Ref Range   B Natriuretic Peptide 454.0 (H) 0.0 - 100.0 pg/mL  Troponin I     Status: Abnormal   Collection Time: 07/16/17 11:37 PM  Result Value Ref Range   Troponin I 0.10 (HH) <0.03 ng/mL    Comment: CRITICAL RESULT CALLED TO, READ BACK BY AND VERIFIED WITH KENNY PATEL AT 0030 07/17/17.PMH  TSH     Status: None   Collection Time:  07/16/17 11:37 PM  Result Value Ref Range   TSH 1.208 0.350 - 4.500 uIU/mL    Comment: Performed by a 3rd Generation assay with a functional sensitivity of <=0.01 uIU/mL.  Glucose, capillary     Status: Abnormal   Collection Time: 07/17/17  5:04 AM  Result Value Ref Range   Glucose-Capillary 137 (H) 65 - 99 mg/dL   Dg Chest Port 1 View  Result Date: 07/16/2017 CLINICAL DATA:  Acute onset of shortness of breath. Initial encounter. EXAM: PORTABLE CHEST 1 VIEW COMPARISON:  Chest radiograph performed 06/30/2017 FINDINGS: The lungs are well-aerated. Mild vascular congestion is noted. Increased interstitial markings raise concern for mild interstitial edema. There is no evidence of pleural effusion or pneumothorax. The cardiomediastinal silhouette is borderline enlarged. The patient is status post median sternotomy, with evidence of prior CABG. A pacemaker/AICD is noted overlying the left chest wall, with leads ending overlying the right atrium and right ventricle. No acute osseous abnormalities are seen. IMPRESSION: Mild vascular congestion and borderline cardiomegaly. Increased interstitial markings raise concern for mild interstitial edema. Electronically Signed   By: Garald Balding M.D.   On: 07/16/2017 23:54    Review of Systems  Constitutional: Negative for chills and fever.  HENT: Negative for sore throat and tinnitus.   Eyes: Negative for blurred vision and redness.  Respiratory: Positive for shortness of breath. Negative for cough.   Cardiovascular: Positive for chest pain. Negative for palpitations, orthopnea and PND.  Gastrointestinal: Negative for abdominal pain, diarrhea, nausea and vomiting.  Genitourinary:  Negative for dysuria, frequency and urgency.  Musculoskeletal: Negative for joint pain and myalgias.  Skin: Negative for rash.       No lesions  Neurological: Negative for speech change, focal weakness and weakness.  Endo/Heme/Allergies: Does not bruise/bleed easily.       No  temperature intolerance  Psychiatric/Behavioral: Negative for depression and suicidal ideas.    Blood pressure (!) 122/46, pulse 70, temperature 98.2 F (36.8 C), temperature source Oral, resp. rate (!) 27, height '5\' 8"'$  (1.727 m), weight 109.8 kg (242 lb 1.6 oz), SpO2 92 %. Physical Exam  Constitutional: He is oriented to person, place, and time. He appears well-developed and well-nourished. No distress.  HENT:  Head: Normocephalic and atraumatic.  Mouth/Throat: Oropharynx is clear and moist.  Eyes: Pupils are equal, round, and reactive to light. Conjunctivae and EOM are normal. No scleral icterus.  Neck: Normal range of motion. Neck supple. No JVD present. No tracheal deviation present. No thyromegaly present.  Cardiovascular: Normal rate, regular rhythm and normal heart sounds.  Exam reveals no gallop and no friction rub.   No murmur heard. Respiratory: Effort normal and breath sounds normal. No respiratory distress.  GI: Soft. Bowel sounds are normal. He exhibits no distension. There is no tenderness.  Genitourinary:  Genitourinary Comments: Deferred  Musculoskeletal: Normal range of motion. He exhibits no edema.  Lymphadenopathy:    He has no cervical adenopathy.  Neurological: He is alert and oriented to person, place, and time. No cranial nerve deficit.  Skin: Skin is warm and dry. No rash noted. No erythema.  Psychiatric: He has a normal mood and affect. His behavior is normal. Judgment and thought content normal.     Assessment/Plan This is a 71 year old male admitted for CHF exacerbation. 1. Congestive heart failure: Acute on chronic; systolic. I have given the patient Lasix 80 mg IV and we will continue his oral torsemide per home dosing schedule. The patient's lungs are clear and he does not have lower extremity edema. Oxygen saturations are normal on room air. Continue 2. CAD: elevated troponin secondary to demand ischemia. Continue to follow cardiac biomarkers. Monitor  telemetry. Consult cardiology if return of chest pain or EKG changes. Continue aspirin and Plavix as well as Imdur 3. Hypertension: Controlled; continue Coreg and lisinopril 4. Diabetes mellitus type 2: Continue basal insulin therapy adjusted for hospital diet. Sliding scale insulin while hospitalized. Hold oral-glycemic agents. Neurontin for neuropathy 5. COPD: Continue inhaled corticosteroid. Albuterol as needed. Taper steroids. Azithromycin for anti-inflammatory effect. 6. Hyperlipidemia: Continue statin therapy 7. Prostate cancer: Continue finasteride and tamsulosin 8. DVT prophylaxis: Lovenox 9. GI prophylaxis: Pantoprazole per home regimen The patient is a full code. Time spent on admission orders and patient care approximately 45  Harrie Foreman, MD 07/17/2017, 5:58 AM

## 2017-07-18 LAB — BASIC METABOLIC PANEL
ANION GAP: 11 (ref 5–15)
BUN: 34 mg/dL — ABNORMAL HIGH (ref 6–20)
CALCIUM: 8.2 mg/dL — AB (ref 8.9–10.3)
CHLORIDE: 107 mmol/L (ref 101–111)
CO2: 23 mmol/L (ref 22–32)
Creatinine, Ser: 1.5 mg/dL — ABNORMAL HIGH (ref 0.61–1.24)
GFR calc Af Amer: 52 mL/min — ABNORMAL LOW (ref >60)
GFR calc non Af Amer: 45 mL/min — ABNORMAL LOW (ref >60)
Glucose, Bld: 207 mg/dL — ABNORMAL HIGH (ref 65–99)
Potassium: 3.4 mmol/L — ABNORMAL LOW (ref 3.5–5.1)
Sodium: 141 mmol/L (ref 135–145)

## 2017-07-18 LAB — CBC
HCT: 26.1 % — ABNORMAL LOW (ref 40.0–52.0)
HEMOGLOBIN: 8.9 g/dL — AB (ref 13.0–18.0)
MCH: 30.1 pg (ref 26.0–34.0)
MCHC: 33.9 g/dL (ref 32.0–36.0)
MCV: 88.7 fL (ref 80.0–100.0)
Platelets: 221 10*3/uL (ref 150–440)
RBC: 2.95 MIL/uL — AB (ref 4.40–5.90)
RDW: 14.4 % (ref 11.5–14.5)
WBC: 11.1 10*3/uL — ABNORMAL HIGH (ref 3.8–10.6)

## 2017-07-18 LAB — TROPONIN I: TROPONIN I: 0.04 ng/mL — AB (ref <0.03)

## 2017-07-18 LAB — GLUCOSE, CAPILLARY
Glucose-Capillary: 204 mg/dL — ABNORMAL HIGH (ref 65–99)
Glucose-Capillary: 156 mg/dL — ABNORMAL HIGH (ref 65–99)

## 2017-07-18 MED ORDER — TORSEMIDE 20 MG PO TABS: 40.0000 mg | ORAL_TABLET | Freq: Two times a day (BID) | ORAL | 0 refills | Status: DC

## 2017-07-18 MED ORDER — POTASSIUM CHLORIDE ER 20 MEQ PO TBCR: 20.0000 meq | EXTENDED_RELEASE_TABLET | Freq: Every day | ORAL | 0 refills | Status: DC

## 2017-07-18 MED ORDER — TORSEMIDE 20 MG PO TABS
40.0000 mg | ORAL_TABLET | Freq: Every day | ORAL | 0 refills | Status: DC
Start: 2017-07-19 — End: 2017-07-18

## 2017-07-18 MED ORDER — PREDNISONE 20 MG PO TABS: 20.0000 mg | ORAL_TABLET | Freq: Every day | ORAL | 0 refills | Status: DC

## 2017-07-18 MED ORDER — POTASSIUM CHLORIDE ER 20 MEQ PO TBCR: 10.0000 meq | EXTENDED_RELEASE_TABLET | Freq: Every day | ORAL | 0 refills | Status: DC

## 2017-07-18 MED ORDER — AZITHROMYCIN 250 MG PO TABS: ORAL_TABLET | ORAL | 0 refills | Status: DC

## 2017-07-18 NOTE — Care Management Important Message (Signed)
Important Message  Patient Details  Name: KOUPER SPINELLA MRN: [id number] Date of Birth: 20-Jan-1946   Medicare Important Message Given:  N/A - LOS <3 / Initial given by admissions    Katrina Stack, RN 07/18/2017, 6:32 PM

## 2017-07-18 NOTE — Progress Notes (Signed)
Inpatient Diabetes Program Recommendations  AACE/ADA: New Consensus Statement on Inpatient Glycemic Control (2015)  Target Ranges:  Prepandial:   less than 140 mg/dL      Peak postprandial:   less than 180 mg/dL (1-2 hours)      Critically ill patients:  140 - 180 mg/dL   Results for TYWAN, SIEVER (MRN [id number]) as of 07/18/2017 08:53  Ref. Range 07/17/2017 08:05 07/17/2017 12:14 07/17/2017 16:46 07/17/2017 21:07  Glucose-Capillary Latest Ref Range: 65 - 99 mg/dL 181 (H)  4 units Novolog 195 (H)  4 units Novolog 210 (H)  7 units Novolog 209 (H)  60 units Lantus    Results for NIKLAUS, MAMARIL (MRN [id number]) as of 07/18/2017 08:53  Ref. Range 07/18/2017 07:51  Glucose-Capillary Latest Ref Range: 65 - 99 mg/dL 204 (H)    Admit with: SOB  History: DM, CHF  Home DM Meds: Lantus 100 units QHS       Humalog 8-18 units TID per SSI  Current Insulin Orders: Lantus 60 units QHS      Novolog Resistant Correction Scale/ SSI (0-20 units) TID AC      MD- Note patient received 125 mg Solumedrol X 1 dose yesterday around midnight.  Now getting Prednisone taper.  Please consider the following in-hospital insulin adjustments:  1. Increase Lantus to 70 units QHS  2. Start Novolog Meal Coverage: Novolog 6 units TID with meals (hold if pt eats <50% of meal)       --Will follow patient during hospitalization--  Wyn Quaker RN, MSN, CDE Diabetes Coordinator Inpatient Glycemic Control Team Team Pager: [phone] (8a-5p)

## 2017-07-18 NOTE — Discharge Summary (Signed)
[facility] [place] NAME: [name]    MR#:  [id number]  DATE OF BIRTH:  14-Dec-1945  DATE OF ADMISSION:  07/16/2017 ADMITTING PHYSICIAN: Harrie Foreman, MD  DATE OF DISCHARGE: 07/18/17 PRIMARY CARE PHYSICIAN: [name], MD    ADMISSION DIAGNOSIS:  Shortness of breath [R06.02] Hypokalemia [E87.6] Elevated troponin [R74.8] COPD exacerbation (HCC) [J44.1] Acute on chronic congestive heart failure, unspecified heart failure type ([place]) [I50.9]  DISCHARGE DIAGNOSIS:  Active Problems:   Acute on chronic systolic heart failure (HCC)  Non compliance with meds  SECONDARY DIAGNOSIS:   Past Medical History:  Diagnosis Date  . Anemia   . Cancer ([place]) 12/2013   prostate  . Cardiogenic pulmonary edema ([place]) 12/19/2014  . Cardiomyopathy, ischemic   . CHF (congestive heart failure) ([place])   . COPD (chronic obstructive pulmonary disease) ([place])   . Diabetes mellitus without complication ([place])   . GERD (gastroesophageal reflux disease)   . Hypercholesteremia   . Hypertension   . Myocardial infarction ([place]) [id number]  . 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 The patient states that his breathing became worse this evening prior to admission. He took 2 extra doses of torsemide and used a breathing treatment and his maintenance inhalers. Now these interventions relieved the shortness of breath which eventually grew into centralized chest pressure. The patient denies nausea, vomiting or diaphoresis. He also denies radiation of pain. In the emergency department Nitropaste was placed on his chest and the patient received Solu-Medrol as some of his respiratory distress was thought to be secondary to  COPD exacerbation. This relieved his chest pain almost entirely. He does not have an oxygen requirement. However, due to his increased work of breathing and elevated troponin the emergency department staff called the hospitalist service for admission.   . Congestive heart failure: Acute on chronic; systolic.  pt has recieved Lasix 80 mg IV and increase patient's torsemide to 40 mg by mouth twice a day For a week until seen by cardiology whick can be decreased to once daily if stable Cardiac diet, provide diet education as patient seems to be noncompliant with the low-salt diet Monitor intake and output Outpatient CHF clinic follow-up as patient was just admitted to hospital with similar complaint on July 27,hormone scheduled August 21 at 2:20 PM Consult cardiology-kc,outpatient follow-up and compliance with the diet and medications as recommended  2. CAD:  elevated troponin at 0.10 secondary to demand ischemia.  Continue to follow cardiac biomarkers. Monitor telemetry.  Follow[facility]up with [facility] cardiology   Continue aspirin and Plavix as well as Imdur  3. Hypertension: Controlled; continue Coreg and lisinopril  4. Diabetes mellitus type 2: Continue basal insulin therapy adjusted for hospital diet. Sliding scale insulin while hospitalized. Hold oral-glycemic agents. Neurontin forneuropathy  5. COPD: Continue inhaled corticosteroid. Albuterol as needed. Taper steroids.Azithromycin for anti-inflammatory effect.  6. Hyperlipidemia: Continue statin therapy  7. Prostate cancer: Continue finasteride and tamsulosin  8. DVT prophylaxis: Lovenox 9. GI prophylaxis: Pantoprazole per home regimen  DISCHARGE CONDITIONS:   stable  CONSULTS OBTAINED:  Treatment Team:  Teodoro Spray, MD Yolonda Kida, MD   PROCEDURES  None   DRUG ALLERGIES:   Allergies  Allergen Reactions  . Benadryl [Diphenhydramine] Other (See Comments)    "  Hyperactivity"  . Doxycycline Swelling    Pt went  into pulmonary edema.  . Lopid [Gemfibrozil] Swelling    "I gain 1 pound a day for 30 days."    DISCHARGE MEDICATIONS:   Current Discharge Medication List    START taking these medications   Details  azithromycin (ZITHROMAX) 250 MG tablet Take 1 tablet by mouth once daily for 4 days Qty: 4 each, Refills: 0    predniSONE (DELTASONE) 20 MG tablet Take 1 tablet (20 mg total) by mouth daily with breakfast. Qty: 5 tablet, Refills: 0      CONTINUE these medications which have CHANGED   Details  potassium chloride 20 MEQ TBCR Take 20 mEq by mouth daily. Qty: 30 tablet, Refills: 0    torsemide (DEMADEX) 20 MG tablet Take 2 tablets (40 mg total) by mouth 2 (two) times daily. Torsemide 40 mg twice a day for1 week followed by once daily if okay with cardiology Qty: 60 tablet, Refills: 0      CONTINUE these medications which have NOT CHANGED   Details  acetaminophen (TYLENOL) 325 MG tablet Take 2 tablets (650 mg total) by mouth every 6 (six) hours as needed for mild pain (or Fever >/= 101).    albuterol (PROVENTIL HFA;VENTOLIN HFA) 108 (90 Base) MCG/ACT inhaler Inhale 2 puffs into the lungs 4 (four) times daily as needed for wheezing or shortness of breath.     albuterol-ipratropium (COMBIVENT) 18-103 MCG/ACT inhaler Inhale 1 puff into the lungs 2 times daily at 12 noon and 4 pm.    atorvastatin (LIPITOR) 80 MG tablet Take 1 tablet (80 mg total) by mouth every evening. Qty: 30 tablet, Refills: 3    budesonide-formoterol (SYMBICORT) 80-4.5 MCG/ACT inhaler Inhale 2 puffs into the lungs 2 (two) times daily.    calcium carbonate (TUMS - DOSED IN MG ELEMENTAL CALCIUM) 500 MG chewable tablet Chew 1 tablet by mouth as needed for indigestion or heartburn.    carvedilol (COREG) 6.25 MG tablet Take 6.25 mg by mouth 2 (two) times daily with a meal.    clopidogrel (PLAVIX) 75 MG tablet Take 75 mg by mouth every morning.    co-enzyme Q-10 30 MG capsule Take 30 mg by mouth daily.    ferrous  sulfate 325 (65 FE) MG tablet Take 325 mg by mouth 2 (two) times daily with a meal.    finasteride (PROSCAR) 5 MG tablet Take 5 mg by mouth at bedtime.    FLUoxetine (PROZAC) 20 MG capsule Take 20 mg by mouth daily.    gabapentin (NEURONTIN) 300 MG capsule Take 300 mg by mouth 2 (two) times daily.     insulin glargine (LANTUS) 100 UNIT/ML injection Inject 100 Units into the skin at bedtime.     insulin lispro (HUMALOG) 100 UNIT/ML KiwkPen Inject 8-18 Units into the skin 3 (three) times daily with meals. Per sliding scale    isosorbide mononitrate (IMDUR) 60 MG 24 hr tablet Take 60 mg by mouth daily.    lansoprazole (PREVACID SOLUTAB) 30 MG disintegrating tablet Take 30 mg by mouth 2 (two) times daily.    lisinopril (PRINIVIL,ZESTRIL) 20 MG tablet Take 20 mg by mouth daily.     loratadine (CLARITIN) 10 MG tablet Take 10 mg by mouth daily.    Multiple Vitamins-Minerals (CENTRUM SILVER PO) Take 1 tablet by mouth every morning.    nitroGLYCERIN (NITROSTAT) 0.4 MG SL tablet Place 0.4 mg under the tongue every 5 (five) minutes as needed for chest pain.  ranolazine (RANEXA) 500 MG 12 hr tablet Take 500 mg by mouth 2 (two) times daily.    spironolactone (ALDACTONE) 25 MG tablet Take 12.5 mg by mouth daily.    sucralfate (CARAFATE) 1 g tablet Take 1 g by mouth 2 (two) times daily.    tamsulosin (FLOMAX) 0.4 MG CAPS capsule Take 0.4 mg by mouth daily after supper.         DISCHARGE INSTRUCTIONS:   Heart Failure Clinic appointment on July 26 2017 at 2:20pm with Darylene Price, [place]. Please call [phone] to reschedule.  Follow-up with primary care physicianin a week Follow-up with cardiology Dr. Nehemiah Massed in 2 weeks Follow-up healthy heart diet   DIET:  Cardiac diet and Diabetic diet  DISCHARGE CONDITION:  Fair  ACTIVITY:  Activity as tolerated  OXYGEN:  Home Oxygen: No.   Oxygen Delivery: room air  DISCHARGE LOCATION:  home   If you experience worsening of your  admission symptoms, develop shortness of breath, life threatening emergency, suicidal or homicidal thoughts you must seek medical attention immediately by calling 911 or calling your MD immediately  if symptoms less severe.  You Must read complete instructions/literature along with all the possible adverse reactions/side effects for all the Medicines you take and that have been prescribed to you. Take any new Medicines after you have completely understood and accpet all the possible adverse reactions/side effects.   Please note  You were cared for by a hospitalist during your hospital stay. If you have any questions about your discharge medications or the care you received while you were in the hospital after you are discharged, you can call the unit and asked to speak with the hospitalist on call if the hospitalist that took care of you is not available. Once you are discharged, your primary care physician will handle any further medical issues. Please note that NO REFILLS for any discharge medications will be authorized once you are discharged, as it is imperative that you return to your primary care physician (or establish a relationship with a primary care physician if you do not have one) for your aftercare needs so that they can reassess your need for medications and monitor your lab values.     Today  Chief Complaint  Patient presents with  . Shortness of Breath   Patient denies any chest pain or shortness of breath. No swelling in his feet. Needs diet education  ROS:  CONSTITUTIONAL: Denies fevers, chills. Denies any fatigue, weakness.  EYES: Denies blurry vision, double vision, eye pain. EARS, NOSE, THROAT: Denies tinnitus, ear pain, hearing loss. RESPIRATORY: Denies cough, wheeze, shortness of breath.  CARDIOVASCULAR: Denies chest pain, palpitations, edema.  GASTROINTESTINAL: Denies nausea, vomiting, diarrhea, abdominal pain. Denies bright red blood per rectum. GENITOURINARY:  Denies dysuria, hematuria. ENDOCRINE: Denies nocturia or thyroid problems. HEMATOLOGIC AND LYMPHATIC: Denies easy bruising or bleeding. SKIN: Denies rash or lesion. MUSCULOSKELETAL: Denies pain in neck, back, shoulder, knees, hips or arthritic symptoms.  NEUROLOGIC: Denies paralysis, paresthesias.  PSYCHIATRIC: Denies anxiety or depressive symptoms.   VITAL SIGNS:  Blood pressure (!) 110/50, pulse 71, temperature 97.9 F (36.6 C), temperature source Oral, resp. rate 18, height 5\' 8"  (1.727 m), weight 110 kg (242 lb 9.6 oz), SpO2 95 %.  I/O:    Intake/Output Summary (Last 24 hours) at 07/18/17 1434 Last data filed at 07/18/17 1354  Gross per 24 hour  Intake              480 ml  Output  550 ml  Net              -70 ml    PHYSICAL EXAMINATION:  GENERAL:  71 y.o.-year-old patient lying in the bed with no acute distress.  EYES: Pupils equal, round, reactive to light and accommodation. No scleral icterus. Extraocular muscles intact.  HEENT: Head atraumatic, normocephalic. Oropharynx and nasopharynx clear.  NECK:  Supple, no jugular venous distention. No thyroid enlargement, no tenderness.  LUNGS: Normal breath sounds bilaterally, no wheezing, rales,rhonchi or crepitation. No use of accessory muscles of respiration.  CARDIOVASCULAR: S1, S2 normal. No murmurs, rubs, or gallops.  ABDOMEN: Soft, non-tender, non-distended. Bowel sounds present. No organomegaly or mass.  EXTREMITIES: No pedal edema, cyanosis, or clubbing.  NEUROLOGIC: Cranial nerves II through XII are intact. Muscle strength 5/5 in all extremities. Sensation intact. Gait not checked.  PSYCHIATRIC: The patient is alert and oriented x 3.  SKIN: No obvious rash, lesion, or ulcer.   DATA REVIEW:   CBC  Recent Labs Lab 07/18/17 0049  WBC 11.1*  HGB 8.9*  HCT 26.1*  PLT 221    Chemistries   Recent Labs Lab 07/16/17 2337 07/17/17 0943 07/18/17 0049  NA 142 139 141  K 2.9* 3.4* 3.4*  CL 106 104 107   CO2 25 23 23   GLUCOSE 122* 260* 207*  BUN 23* 27* 34*  CREATININE 1.48* 1.41* 1.50*  CALCIUM 7.9* 8.1* 8.2*  MG  --  1.6*  --   AST 25  --   --   ALT 19  --   --   ALKPHOS 69  --   --   BILITOT 0.6  --   --     Cardiac Enzymes  Recent Labs Lab 07/18/17 0049  TROPONINI 0.04*    Microbiology Results  Results for orders placed or performed during the hospital encounter of 06/30/17  MRSA PCR Screening     Status: None   Collection Time: 07/01/17  1:16 AM  Result Value Ref Range Status   MRSA by PCR NEGATIVE NEGATIVE Final    Comment:        The GeneXpert MRSA Assay (FDA approved for NASAL specimens only), is one component of a comprehensive MRSA colonization surveillance program. It is not intended to diagnose MRSA infection nor to guide or monitor treatment for MRSA infections.     RADIOLOGY:  Dg Chest Port 1 View  Result Date: 07/16/2017 CLINICAL DATA:  Acute onset of shortness of breath. Initial encounter. EXAM: PORTABLE CHEST 1 VIEW COMPARISON:  Chest radiograph performed 06/30/2017 FINDINGS: The lungs are well-aerated. Mild vascular congestion is noted. Increased interstitial markings raise concern for mild interstitial edema. There is no evidence of pleural effusion or pneumothorax. The cardiomediastinal silhouette is borderline enlarged. The patient is status post median sternotomy, with evidence of prior CABG. A pacemaker/AICD is noted overlying the left chest wall, with leads ending overlying the right atrium and right ventricle. No acute osseous abnormalities are seen. IMPRESSION: Mild vascular congestion and borderline cardiomegaly. Increased interstitial markings raise concern for mild interstitial edema. Electronically Signed   By: Garald Balding M.D.   On: 07/16/2017 23:54    EKG:   Orders placed or performed during the hospital encounter of 07/16/17  . ED EKG  . ED EKG  . EKG 12-Lead  . EKG 12-Lead  . EKG 12-Lead  . EKG 12-Lead      Management  plans discussed with the patient, family and they are in agreement.  CODE STATUS:  Code Status Orders        Start     Ordered   07/17/17 0440  Full code  Continuous     07/17/17 0439    Code Status History    Date Active Date Inactive Code Status Order ID Comments User Context   07/01/2017  1:19 AM 07/02/2017  3:46 PM Full Code [id number]  Lance Coon, MD Inpatient   04/20/2017  2:32 AM 04/21/2017  6:16 PM Full Code [id number]  Saundra Shelling, MD Inpatient   01/31/2016  1:43 AM 02/02/2016  9:11 PM Full Code [id number]  Hillary Bow, MD ED   12/12/2015  4:27 PM 12/13/2015  3:29 PM Full Code [id number]  [name], MD Inpatient    Advance Directive Documentation     Most Recent Value  Type of Advance Directive  Living will, Healthcare Power of Attorney  Pre-existing out of facility DNR order (yellow form or pink MOST form)  -  "MOST" Form in Place?  -      TOTAL TIME TAKING CARE OF THIS PATIENT: 45  minutes.   Note: This dictation was prepared with Dragon dictation along with smaller phrase technology. Any transcriptional errors that result from this process are unintentional.   @[facility] @  on 07/18/2017 at 2:34 PM  Between 7am to 6pm - Pager - [phone]  After 6pm go to [email or web address] - password [email or web address] [facility]  Office  [phone]  CC: Primary care physician; [name], MD

## 2017-07-18 NOTE — Progress Notes (Signed)
HF Clinic appointment scheduled for July 26, 2017 at 2:20pm.   Of note, he has missed his last 2 appointments with the most recent being on 07/12/17. He hasn't been seen in the HF Clinic since August 2017.

## 2017-07-18 NOTE — Discharge Instructions (Signed)
Heart Failure Clinic appointment on July 26 2017 at 2:20pm with Darylene Price, [place]. Please call [phone] to reschedule.  Follow-up with primary care physicianin a week Follow-up with cardiology Dr. Nehemiah Massed in 2 weeks Follow-up healthy heart diet

## 2017-07-18 NOTE — Plan of Care (Signed)
Problem: Limited Adherence to Nutrition-Related Recommendations (NB-1.6) Goal: Nutrition education Formal process to instruct or train a patient/client in a skill or to impart knowledge to help patients/clients voluntarily manage or modify food choices and eating behavior to maintain or improve health. Outcome: Adequate for Discharge Nutrition Education Note  RD consulted for nutrition education regarding new onset CHF.  RD provided "Low Sodium Nutrition Therapy" handout from the Academy of Nutrition and Dietetics. Reviewed patient's dietary recall. Provided examples on ways to decrease sodium intake in diet. Discouraged intake of processed foods and use of salt shaker. Encouraged fresh fruits and vegetables as well as whole grain sources of carbohydrates to maximize fiber intake.   RD discussed why it is important for patient to adhere to diet recommendations, and emphasized the role of fluids, foods to avoid, and importance of weighing self daily. Teach back method used.  Expect fair compliance.  Body mass index is 36.89 kg/m. Pt meets criteria for obese class II based on current BMI.  Current diet order is low sodium, patient is consuming approximately 100% of meals at this time. Labs and medications reviewed. No further nutrition interventions warranted at this time. RD contact information provided. If additional nutrition issues arise, please re-consult RD.   Satira Anis. Sumeya Yontz, MS, RD LDN Inpatient Clinical Dietitian Pager [phone]

## 2017-07-18 NOTE — Progress Notes (Signed)
A & O. Pt reports no pain. IV and tele removed. Prescriptions given prescriptions. Discharge instructions reviewed. Pt has no further concerns at this time.

## 2017-07-20 DIAGNOSIS — N289 Disorder of kidney and ureter, unspecified: Secondary | ICD-10-CM | POA: Diagnosis not present

## 2017-07-20 DIAGNOSIS — I5023 Acute on chronic systolic (congestive) heart failure: Secondary | ICD-10-CM | POA: Diagnosis not present

## 2017-07-20 DIAGNOSIS — E876 Hypokalemia: Secondary | ICD-10-CM | POA: Diagnosis not present

## 2017-07-20 NOTE — Consult Note (Signed)
Reason for Consult: Congestive heart failure and severe cardiomyopathy Referring Physician: Dr. Lance Coon hospitalist, Dr Netty Starring primary Cardiologist Dr. Onalee Hua [name] Daniel [name] is an 71 y.o. [name].  HPI: Patient's a 71 year old [name] with history of congestive heart failure systolic dysfunction ischemic cardiomyopathy ejection fraction around 20% recently was admitted about 3 weeks ago treated for heart failure patient who presents again with episode of shortness of breath over the last few days he didn't really quite as long before he presented to emergency room patient was treated with diuretic therapy and now has improved shortness of breath denied any significant leg swelling no chest pain. Patient has a history of obstructive sleep apnea on CPAP has anemia COPD diabetes hypertension obesity states to be done reasonably well shortness of breath is improved since he was treated with therapy. Patient complaining of mild congestion over the last few days so presented for further assessment and evaluation  Past Medical History:  Diagnosis Date  . Anemia   . Cancer ([place]) 12/2013   prostate  . Cardiogenic pulmonary edema ([place]) 12/19/2014  . Cardiomyopathy, ischemic   . CHF (congestive heart failure) ([place])   . COPD (chronic obstructive pulmonary disease) ([place])   . Diabetes mellitus without complication ([place])   . GERD (gastroesophageal reflux disease)   . Hypercholesteremia   . Hypertension   . Myocardial infarction ([place]) [id number]  . Shortness of breath dyspnea     Past Surgical History:  Procedure Laterality Date  . CARDIAC CATHETERIZATION N/A 02/02/2016   Procedure: Left Heart Cath and Coronary Angiography;  Surgeon: [name], MD;  Location: [facility];  Service: Cardiovascular;  Laterality: N/A;  . CORONARY ANGIOPLASTY WITH STENT PLACEMENT    . CORONARY ARTERY BYPASS GRAFT  11/24/2010  . IMPLANTABLE CARDIOVERTER DEFIBRILLATOR (ICD) GENERATOR CHANGE Left  12/12/2015   Procedure: DUAL LEAD PLACEMENT CARDIAC DIFIBRILLATOR;  Surgeon: [name], MD;  Location: [facility];  Service: Cardiovascular;  Laterality: Left;  . TONSILLECTOMY      Family History  Problem Relation Age of Onset  . CAD Mother   . Cancer Mother   . Diabetes Mother   . Alzheimer's disease Father   . Cancer Father   . Heart disease Father     Social History:  reports that he quit smoking about 20 years ago. He has a 60.00 pack-year smoking history. He has never used smokeless tobacco. He reports that he does not drink alcohol or use drugs.  Allergies:  Allergies  Allergen Reactions  . Benadryl [Diphenhydramine] Other (See Comments)    " Hyperactivity"  . Doxycycline Swelling    Pt went into pulmonary edema.  . Lopid [Gemfibrozil] Swelling    "I gain 1 pound a day for 30 days."    Medications: I have reviewed the patient's current medications.  No results found for this or any previous visit (from the past 48 hour(s)).  No results found.  Review of Systems  Constitutional: Positive for diaphoresis and malaise/fatigue.  HENT: Positive for congestion.   Eyes: Negative.   Respiratory: Positive for shortness of breath.   Cardiovascular: Positive for palpitations, orthopnea, leg swelling and PND.  Gastrointestinal: Negative.   Genitourinary: Negative.   Musculoskeletal: Negative.   Skin: Negative.   Neurological: Positive for weakness.  Endo/Heme/Allergies: Negative.   Psychiatric/Behavioral: Negative.    Blood pressure (!) 110/50, pulse 71, temperature 97.9 F (36.6 C), temperature source Oral, resp. rate 18, height 5\' 8"  (1.727 m), weight 110 kg (242 lb  9.6 oz), SpO2 95 %. Physical Exam  Nursing note and vitals reviewed. Constitutional: He is oriented to person, place, and time. He appears well-developed and well-nourished.  HENT:  Head: Normocephalic and atraumatic.  Eyes: Pupils are equal, round, and reactive to light. Conjunctivae and EOM are  normal.  Neck: Normal range of motion. Neck supple.  Cardiovascular: Normal rate and regular rhythm.  Exam reveals gallop.   Murmur heard. Respiratory: Effort normal and breath sounds normal.  GI: Soft. Bowel sounds are normal.  Musculoskeletal: Normal range of motion.  Neurological: He is alert and oriented to person, place, and time. He has normal reflexes.  Skin: Skin is warm and dry.  Psychiatric: He has a normal mood and affect.    Assessment/Plan: Congestive heart failure systolic dysfunction Cardiomyopathy systolic Shortness of breath Obesity Diabetes type 2 Angina Coronary bypass surgery AICD PCI and stent Obstructive sleep apnea Chronic renal insufficiency . Plan Agree with admission for heart failure Rule out for myocardial infarction) EKGs and troponins Continue BiPAP as needed for shortness of breath Continue intravenous diuretic therapy for heart failure Continue diabetes management Agree with current medications of coronary disease Obstructive sleep apnea. His BiPAP to help with respiratory Follow-up AICD interrogation in the office Recommend weight loss exercise portion control Have the patient follow-up with Dr. Nehemiah [name] in 1-2 weeks in the office  Dwayne D Callwood 07/20/2017, 12:53 PM

## 2017-07-25 DIAGNOSIS — I25810 Atherosclerosis of coronary artery bypass graft(s) without angina pectoris: Secondary | ICD-10-CM | POA: Diagnosis not present

## 2017-07-25 DIAGNOSIS — I5022 Chronic systolic (congestive) heart failure: Secondary | ICD-10-CM | POA: Diagnosis not present

## 2017-07-25 DIAGNOSIS — E7800 Pure hypercholesterolemia, unspecified: Secondary | ICD-10-CM | POA: Diagnosis not present

## 2017-07-25 DIAGNOSIS — E876 Hypokalemia: Secondary | ICD-10-CM | POA: Diagnosis not present

## 2017-07-25 DIAGNOSIS — I255 Ischemic cardiomyopathy: Secondary | ICD-10-CM | POA: Diagnosis not present

## 2017-07-26 ENCOUNTER — Telehealth: Payer: Self-pay | Admitting: Family

## 2017-07-26 ENCOUNTER — Ambulatory Visit: Payer: PPO | Admitting: Family

## 2017-07-26 NOTE — Telephone Encounter (Signed)
Patient did not show for his Heart Failure Clinic appointment on 07/26/17. Will attempt to reschedule.

## 2017-08-22 DIAGNOSIS — M25572 Pain in left ankle and joints of left foot: Secondary | ICD-10-CM | POA: Diagnosis not present

## 2017-08-26 DIAGNOSIS — C61 Malignant neoplasm of prostate: Secondary | ICD-10-CM | POA: Diagnosis not present

## 2017-08-29 DIAGNOSIS — M109 Gout, unspecified: Secondary | ICD-10-CM | POA: Diagnosis not present

## 2017-08-29 DIAGNOSIS — Z794 Long term (current) use of insulin: Secondary | ICD-10-CM | POA: Diagnosis not present

## 2017-08-29 DIAGNOSIS — E1140 Type 2 diabetes mellitus with diabetic neuropathy, unspecified: Secondary | ICD-10-CM | POA: Diagnosis not present

## 2017-08-29 DIAGNOSIS — M25571 Pain in right ankle and joints of right foot: Secondary | ICD-10-CM | POA: Diagnosis not present

## 2017-09-05 DIAGNOSIS — I10 Essential (primary) hypertension: Secondary | ICD-10-CM | POA: Diagnosis not present

## 2017-09-05 DIAGNOSIS — I5022 Chronic systolic (congestive) heart failure: Secondary | ICD-10-CM | POA: Diagnosis not present

## 2017-09-05 DIAGNOSIS — I25810 Atherosclerosis of coronary artery bypass graft(s) without angina pectoris: Secondary | ICD-10-CM | POA: Diagnosis not present

## 2017-09-06 DIAGNOSIS — M10071 Idiopathic gout, right ankle and foot: Secondary | ICD-10-CM | POA: Diagnosis not present

## 2017-09-06 DIAGNOSIS — I10 Essential (primary) hypertension: Secondary | ICD-10-CM | POA: Diagnosis not present

## 2017-09-15 DIAGNOSIS — E1142 Type 2 diabetes mellitus with diabetic polyneuropathy: Secondary | ICD-10-CM | POA: Diagnosis not present

## 2017-09-23 DIAGNOSIS — E1159 Type 2 diabetes mellitus with other circulatory complications: Secondary | ICD-10-CM | POA: Diagnosis not present

## 2017-09-23 DIAGNOSIS — N183 Chronic kidney disease, stage 3 (moderate): Secondary | ICD-10-CM | POA: Diagnosis not present

## 2017-09-23 DIAGNOSIS — E1142 Type 2 diabetes mellitus with diabetic polyneuropathy: Secondary | ICD-10-CM | POA: Diagnosis not present

## 2017-09-23 DIAGNOSIS — Z794 Long term (current) use of insulin: Secondary | ICD-10-CM | POA: Diagnosis not present

## 2017-09-23 DIAGNOSIS — E1122 Type 2 diabetes mellitus with diabetic chronic kidney disease: Secondary | ICD-10-CM | POA: Diagnosis not present

## 2017-11-22 ENCOUNTER — Emergency Department
Admission: EM | Admit: 2017-11-22 | Discharge: 2017-11-22 | Disposition: A | Discharge status: Home / Self Care | Payer: PPO | Attending: Emergency Medicine | Admitting: Emergency Medicine

## 2017-11-22 ENCOUNTER — Emergency Department: Payer: PPO

## 2017-11-22 ENCOUNTER — Encounter: Payer: Self-pay | Admitting: Intensive Care

## 2017-11-22 DIAGNOSIS — R0602 Shortness of breath: Secondary | ICD-10-CM | POA: Diagnosis not present

## 2017-11-22 DIAGNOSIS — J449 Chronic obstructive pulmonary disease, unspecified: Secondary | ICD-10-CM | POA: Diagnosis not present

## 2017-11-22 DIAGNOSIS — M25572 Pain in left ankle and joints of left foot: Secondary | ICD-10-CM | POA: Diagnosis not present

## 2017-11-22 DIAGNOSIS — I252 Old myocardial infarction: Secondary | ICD-10-CM | POA: Diagnosis not present

## 2017-11-22 DIAGNOSIS — Z7901 Long term (current) use of anticoagulants: Secondary | ICD-10-CM | POA: Insufficient documentation

## 2017-11-22 DIAGNOSIS — R9431 Abnormal electrocardiogram [ECG] [EKG]: Secondary | ICD-10-CM | POA: Diagnosis not present

## 2017-11-22 DIAGNOSIS — Z87891 Personal history of nicotine dependence: Secondary | ICD-10-CM | POA: Insufficient documentation

## 2017-11-22 DIAGNOSIS — M130 Polyarthritis, unspecified: Secondary | ICD-10-CM | POA: Diagnosis not present

## 2017-11-22 DIAGNOSIS — I25810 Atherosclerosis of coronary artery bypass graft(s) without angina pectoris: Secondary | ICD-10-CM | POA: Diagnosis not present

## 2017-11-22 DIAGNOSIS — R42 Dizziness and giddiness: Secondary | ICD-10-CM | POA: Insufficient documentation

## 2017-11-22 DIAGNOSIS — E119 Type 2 diabetes mellitus without complications: Secondary | ICD-10-CM | POA: Diagnosis not present

## 2017-11-22 DIAGNOSIS — I5022 Chronic systolic (congestive) heart failure: Secondary | ICD-10-CM | POA: Insufficient documentation

## 2017-11-22 DIAGNOSIS — Z794 Long term (current) use of insulin: Secondary | ICD-10-CM | POA: Insufficient documentation

## 2017-11-22 DIAGNOSIS — R531 Weakness: Secondary | ICD-10-CM | POA: Diagnosis not present

## 2017-11-22 DIAGNOSIS — I110 Hypertensive heart disease with heart failure: Secondary | ICD-10-CM | POA: Diagnosis not present

## 2017-11-22 DIAGNOSIS — Z95810 Presence of automatic (implantable) cardiac defibrillator: Secondary | ICD-10-CM | POA: Insufficient documentation

## 2017-11-22 DIAGNOSIS — Z79899 Other long term (current) drug therapy: Secondary | ICD-10-CM | POA: Diagnosis not present

## 2017-11-22 DIAGNOSIS — M109 Gout, unspecified: Secondary | ICD-10-CM | POA: Insufficient documentation

## 2017-11-22 DIAGNOSIS — M25571 Pain in right ankle and joints of right foot: Secondary | ICD-10-CM | POA: Diagnosis not present

## 2017-11-22 LAB — BASIC METABOLIC PANEL
Anion gap: 15 (ref 5–15)
BUN: 16 mg/dL (ref 6–20)
CHLORIDE: 100 mmol/L — AB (ref 101–111)
CO2: 25 mmol/L (ref 22–32)
Calcium: 7.4 mg/dL — ABNORMAL LOW (ref 8.9–10.3)
Creatinine, Ser: 1.28 mg/dL — ABNORMAL HIGH (ref 0.61–1.24)
GFR calc Af Amer: >60 mL/min (ref >60)
GFR calc non Af Amer: 54 mL/min — ABNORMAL LOW (ref >60)
GLUCOSE: 84 mg/dL (ref 65–99)
POTASSIUM: 3.2 mmol/L — AB (ref 3.5–5.1)
Sodium: 140 mmol/L (ref 135–145)

## 2017-11-22 LAB — URINALYSIS, ROUTINE W REFLEX MICROSCOPIC
Bilirubin Urine: NEGATIVE
GLUCOSE, UA: NEGATIVE mg/dL
HGB URINE DIPSTICK: NEGATIVE
Ketones, ur: NEGATIVE mg/dL
Leukocytes, UA: NEGATIVE
Nitrite: NEGATIVE
Protein, ur: NEGATIVE mg/dL
SPECIFIC GRAVITY, URINE: 1.008 (ref 1.005–1.030)
pH: 5 (ref 5.0–8.0)

## 2017-11-22 LAB — CBC
HEMATOCRIT: 33 % — AB (ref 40.0–52.0)
Hemoglobin: 11 g/dL — ABNORMAL LOW (ref 13.0–18.0)
MCH: 30.7 pg (ref 26.0–34.0)
MCHC: 33.4 g/dL (ref 32.0–36.0)
MCV: 91.8 fL (ref 80.0–100.0)
Platelets: 199 10*3/uL (ref 150–440)
RBC: 3.6 MIL/uL — ABNORMAL LOW (ref 4.40–5.90)
RDW: 15.3 % — AB (ref 11.5–14.5)
WBC: 12.6 10*3/uL — ABNORMAL HIGH (ref 3.8–10.6)

## 2017-11-22 LAB — TROPONIN I: Troponin I: 0.06 ng/mL (ref <0.03)

## 2017-11-22 LAB — URIC ACID: Uric Acid, Serum: 9.9 mg/dL — ABNORMAL HIGH (ref 4.4–7.6)

## 2017-11-22 MED ORDER — ALBUTEROL SULFATE (2.5 MG/3ML) 0.083% IN NEBU
5.0000 mg | INHALATION_SOLUTION | Freq: Once | RESPIRATORY_TRACT | Status: AC
  Administered 2017-11-22: 5 mg via RESPIRATORY_TRACT
  Filled 2017-11-22: qty 6

## 2017-11-22 MED ORDER — PREDNISONE 20 MG PO TABS: 40.0000 mg | ORAL_TABLET | Freq: Every day | ORAL | 0 refills | Status: DC

## 2017-11-22 MED ORDER — METHYLPREDNISOLONE SODIUM SUCC 125 MG IJ SOLR
125.0000 mg | Freq: Once | INTRAMUSCULAR | Status: AC
  Administered 2017-11-22: 125 mg via INTRAVENOUS
  Filled 2017-11-22: qty 2

## 2017-11-22 MED ORDER — AZITHROMYCIN 250 MG PO TABS: 250.0000 mg | ORAL_TABLET | Freq: Every day | ORAL | 0 refills | Status: DC

## 2017-11-22 MED ORDER — AZITHROMYCIN 500 MG PO TABS
500.0000 mg | ORAL_TABLET | Freq: Once | ORAL | Status: AC
  Administered 2017-11-22: 500 mg via ORAL
  Filled 2017-11-22: qty 1

## 2017-11-22 NOTE — ED Notes (Signed)
Patient to lobby in wheelchair with family. NAD noted.

## 2017-11-22 NOTE — ED Provider Notes (Signed)
[facility] Emergency Department Provider Note  Time seen: 3:23 PM  I have reviewed the triage vital signs and the nursing notes.   HISTORY  Chief Complaint Shortness of Breath and Weakness    HPI [name] is a 71 y.o. male with a past medical history of diabetes, COPD, CHF, hypertension, hyperlipidemia, presents to the emergency department with bilateral foot pain.  According to the patient he began feeling somewhat dizzy and lightheaded this morning and has been having pain in his feet since yesterday.  Patient states the pain in both of his feet is similar to his prior gout flare he has had.  States it started mostly in the left foot yesterday but is now progressed to both feet mostly in the ankles.  Patient states he had a fever earlier today but is afebrile in the emergency department.  Denies any shortness of breath (contrary to triage note) at any time.  Denies any chest pain, cough, congestion, abdominal pain, nausea, vomiting, diarrhea, dysuria.  Patient's main complaint is of bilateral foot pain, states he was having trouble walking at home due to the discomfort with ambulating due to foot pain.   Past Medical History:  Diagnosis Date  . Anemia   . Cancer ([place]) 12/2013   prostate  . Cardiogenic pulmonary edema ([place]) 12/19/2014  . Cardiomyopathy, ischemic   . CHF (congestive heart failure) ([place])   . COPD (chronic obstructive pulmonary disease) ([place])   . Diabetes mellitus without complication ([place])   . GERD (gastroesophageal reflux disease)   . Hypercholesteremia   . Hypertension   . Myocardial infarction ([place]) [id number]  . Shortness of breath dyspnea     Patient Active Problem List   Diagnosis Date Noted  . Acute on chronic systolic heart failure ([place]) 07/17/2017  . Flash pulmonary edema ([place]) 07/01/2017  . Acute on chronic systolic CHF (congestive heart failure) ([place]) 04/21/2017  . Acute on chronic respiratory failure with hypoxia  ([place]) 04/21/2017  . Hypokalemia 04/21/2017  . Hypomagnesemia 04/21/2017  . Essential hypertension 04/21/2017  . Hyperlipidemia 04/21/2017  . Coronary artery disease 04/21/2017  . CHF (congestive heart failure) ([place]) 04/20/2017  . HTN (hypertension) 02/13/2016  . COPD with chronic bronchitis ([place]) 02/13/2016  . Obstructive sleep apnea 02/13/2016  . Diabetes ([place]) 02/13/2016  . Chronic systolic HF (heart failure) ([place]) 12/12/2015    Past Surgical History:  Procedure Laterality Date  . CARDIAC CATHETERIZATION N/A 02/02/2016   Procedure: Left Heart Cath and Coronary Angiography;  Surgeon: [name], MD;  Location: [place] [facility];  Service: Cardiovascular;  Laterality: N/A;  . CORONARY ANGIOPLASTY WITH STENT PLACEMENT    . CORONARY ARTERY BYPASS GRAFT  11/24/2010  . IMPLANTABLE CARDIOVERTER DEFIBRILLATOR (ICD) GENERATOR CHANGE Left 12/12/2015   Procedure: DUAL LEAD PLACEMENT CARDIAC DIFIBRILLATOR;  Surgeon: [name], MD;  Location: [facility] ORS;  Service: Cardiovascular;  Laterality: Left;  . TONSILLECTOMY      Prior to Admission medications   Medication Sig Start Date End Date Taking? Authorizing Provider  potassium chloride SA (K-DUR,KLOR-CON) 20 MEQ tablet Take 1 tablet by mouth daily. 07/20/17 07/20/18 Yes [provider]  acetaminophen (TYLENOL) 325 MG tablet Take 2 tablets (650 mg total) by mouth every 6 (six) hours as needed for mild pain (or Fever >/= 101). 02/02/16   Gouru, Illene Silver, MD  albuterol (PROVENTIL HFA;VENTOLIN HFA) 108 (90 Base) MCG/ACT inhaler Inhale 2 puffs into the lungs 4 (four) times daily as needed for wheezing or shortness  of breath.     [provider]  albuterol-ipratropium (COMBIVENT) 18-103 MCG/ACT inhaler Inhale 1 puff into the lungs 2 times daily at 12 noon and 4 pm.    [provider]  atorvastatin (LIPITOR) 80 MG tablet Take 1 tablet (80 mg total) by mouth every evening. 04/21/17   Theodoro [name], MD  azithromycin  (ZITHROMAX) 250 MG tablet Take 1 tablet by mouth once daily for 4 days 07/19/17   Nicholes Mango, MD  budesonide-formoterol (SYMBICORT) 80-4.5 MCG/ACT inhaler Inhale 2 puffs into the lungs 2 (two) times daily.    [provider]  calcium carbonate (TUMS - DOSED IN MG ELEMENTAL CALCIUM) 500 MG chewable tablet Chew 1 tablet by mouth as needed for indigestion or heartburn.    [provider]  carvedilol (COREG) 6.25 MG tablet Take 6.25 mg by mouth 2 (two) times daily with a meal.    [provider]  citalopram (CELEXA) 20 MG tablet Take 1 tablet by mouth 2 (two) times daily.    [provider]  clopidogrel (PLAVIX) 75 MG tablet Take 75 mg by mouth every morning.    [provider]  co-enzyme Q-10 30 MG capsule Take 30 mg by mouth daily.    [provider]  ferrous sulfate 325 (65 FE) MG tablet Take 325 mg by mouth 2 (two) times daily with a meal.    [provider]  finasteride (PROSCAR) 5 MG tablet Take 5 mg by mouth at bedtime.    [provider]  FLUoxetine (PROZAC) 20 MG capsule Take 20 mg by mouth daily.    [provider]  gabapentin (NEURONTIN) 300 MG capsule Take 300 mg by mouth 2 (two) times daily.     [provider]  insulin glargine (LANTUS) 100 UNIT/ML injection Inject 100 Units into the skin at bedtime.     [provider]  insulin lispro (HUMALOG) 100 UNIT/ML KiwkPen Inject 8-18 Units into the skin 3 (three) times daily with meals. Per sliding scale    [provider]  isosorbide mononitrate (IMDUR) 60 MG 24 hr tablet Take 60 mg by mouth daily.    [provider]  lansoprazole (PREVACID SOLUTAB) 30 MG disintegrating tablet Take 30 mg by mouth 2 (two) times daily.    [provider]  lisinopril (PRINIVIL,ZESTRIL) 20 MG tablet Take 20 mg by mouth daily.     [provider]  loratadine (CLARITIN) 10 MG tablet Take 10 mg by mouth daily.    [provider]  Multiple Vitamins-Minerals (CENTRUM SILVER PO) Take 1 tablet by mouth every morning.    [provider]  nitroGLYCERIN (NITROSTAT) 0.4 MG SL tablet Place 0.4 mg under the tongue every 5 (five) minutes as needed for chest pain.    [provider]  pantoprazole (PROTONIX) 40 MG tablet Take 1 tablet by mouth 2 (two) times daily.    [provider]  potassium chloride 20 MEQ TBCR Take 20 mEq by mouth daily. 07/18/17   Nicholes Mango, MD  predniSONE (DELTASONE) 20 MG tablet Take 1 tablet (20 mg total) by mouth daily with breakfast. 07/20/17   Gouru, Illene Silver, MD  ranolazine (RANEXA) 500 MG 12 hr tablet Take 500 mg by mouth 2 (two) times daily.    [provider]  spironolactone (ALDACTONE) 25 MG tablet Take 12.5 mg by mouth daily.    [provider]  sucralfate (CARAFATE) 1 g tablet Take 1 g by mouth 2 (two) times daily.  [provider]  tamsulosin (FLOMAX) 0.4 MG CAPS capsule Take 0.4 mg by mouth daily after supper.    [provider]  torsemide (DEMADEX) 20 MG tablet Take 2 tablets (40 mg total) by mouth 2 (two) times daily. Torsemide 40 mg twice a day for1 week followed by once daily if okay with cardiology 07/18/17   Nicholes Mango, MD    Allergies  Allergen Reactions  . Benadryl [Diphenhydramine] Other (See Comments)    " Hyperactivity"  . Doxycycline Swelling    Pt went into pulmonary edema.  . Lopid [Gemfibrozil] Swelling    "I gain 1 pound a day for 30 days."    Family History  Problem Relation Age of Onset  . CAD Mother   . Cancer Mother   . Diabetes Mother   . Alzheimer's disease Father   . Cancer Father   . Heart disease Father     Social History Social History   Tobacco Use  . Smoking status: Former Smoker    Packs/day: 2.00    Years: 30.00    Pack years: 60.00    Last attempt to quit: 12/03/1996    Years since quitting: 20.9  . Smokeless tobacco: Never Used  Substance Use Topics  . Alcohol use: No     Alcohol/week: 0.0 oz  . Drug use: No    Review of Systems Constitutional: Possible fever earlier today per patient. Cardiovascular: Negative for chest pain. Respiratory: Negative for shortness of breath. Gastrointestinal: Negative for abdominal pain Musculoskeletal: The bilateral ankle and foot pain, left greater than right Neurological: Negative for headache All other ROS negative  ____________________________________________   PHYSICAL EXAM:  VITAL SIGNS: ED Triage Vitals  Enc Vitals Group     BP 11/22/17 1333 (!) 138/91     Pulse Rate 11/22/17 1333 98     Resp 11/22/17 1333 20     Temp 11/22/17 1333 (!) 97.4 F (36.3 C)     Temp Source 11/22/17 1333 Oral     SpO2 11/22/17 1333 94 %     Weight 11/22/17 1336 240 lb (108.9 kg)     Height 11/22/17 1336 5\' 8"  (1.727 m)     Head Circumference --      Peak Flow --      Pain Score 11/22/17 1339 7     Pain Loc --      Pain Edu? --      Excl. in [place]? --     Constitutional: Alert and oriented. Well appearing and in no distress. Eyes: Normal exam ENT   Head: Normocephalic and atraumatic.   Mouth/Throat: Mucous membranes are moist. Cardiovascular: Normal rate, regular rhythm.  Respiratory: Normal respiratory effort without tachypnea nor retractions. Breath sounds are clear Gastrointestinal: Soft and nontender. No distention.  Musculoskeletal: Moderate tenderness in bilateral ankles, left greater than right, mild pain with range of motion of the ankles again left greater than right.  2+ DP pulse, sensation intact and equal, good distal capillary refill no pain in the toes. Neurologic:  Normal speech and language. No gross focal neurologic deficits Skin:  Skin is warm, dry and intact.  Psychiatric: Mood and affect are normal.   ____________________________________________    EKG  EKG reviewed and interpreted by myself shows an atrial sensed ventricular paced rhythm at 97 bpm with a widened QRS, normal axis,  nonspecific ST changes.  ____________________________________________    RADIOLOGY  X-ray is stable, does show pulmonary vascular congestion with possible mild interstitial edema  ____________________________________________  INITIAL IMPRESSION / ASSESSMENT AND PLAN / ED COURSE  Pertinent labs & imaging results that were available during my care of the patient were reviewed by me and considered in my medical decision making (see chart for details).  Patient presents to the emergency department with lightheadedness/dizziness as well as bilateral foot pain.  Differential would include gout flare, polyarthropathy, less likely septic joint, osteoarthritis.  Overall the patient appears very well, no distress, is lying in bed comfortable, calm and cooperative.  Patient does have moderate tenderness of bilateral ankles but no noted swelling/edema, no erythema, patient is afebrile.  Patient's labs show a mild leukocytosis of 12,000 largely unchanged from prior, mild troponin elevation 0.06 however again largely unchanged from prior and the patient denies any shortness of breath or chest pain.  Patient has mild renal insufficiency with a creatinine 1.28, again largely unchanged from baseline in reviewing the patient's records.  Patient's labs show an elevated uric acid level.  We will treat with Solu-Medrol in the emergency department and discharged with prednisone for likely gout flare.  Patient states he had a fever at home earlier today although without intervention the patient's temperature is 97.4, on recheck it is 99.2.  Given the patient's possible fever with possible interstitial edema seen on x-ray we will cover with antibiotics for possible atypical infection as a precaution given the patient's significant comorbidities I believe the benefit would outweigh the risk.  I discussed with the patient need to follow-up with his doctor preferably tomorrow for recheck/reevaluation.  I also discussed  return precautions, the patient is agreeable to this plan of care.  ____________________________________________   FINAL CLINICAL IMPRESSION(S) / ED DIAGNOSES  Polyarthropathy Dizziness    Harvest Dark, MD 11/22/17 1733

## 2017-11-22 NOTE — Discharge Instructions (Addendum)
Please follow-up with your doctor in 1-2 days for recheck/reevaluation.  Please take your steroids and antibiotics as prescribed.  Please return to the emergency department for any significant worsening of pain, significant fever, development of chest pain, trouble breathing or abdominal pain, or any other symptom personally concerning to yourself.

## 2017-11-22 NOTE — ED Triage Notes (Addendum)
Patient reports he started feeling SOB about an hour ago, weakness, and pain in his feet due to gout flare up X2 days. HX COPD and neuropathy. A&O x4. Labored breathing in triage. Takes plavix daily

## 2017-11-25 DIAGNOSIS — M109 Gout, unspecified: Secondary | ICD-10-CM | POA: Diagnosis not present

## 2017-11-25 DIAGNOSIS — A499 Bacterial infection, unspecified: Secondary | ICD-10-CM | POA: Diagnosis not present

## 2017-12-02 DIAGNOSIS — E1140 Type 2 diabetes mellitus with diabetic neuropathy, unspecified: Secondary | ICD-10-CM | POA: Diagnosis not present

## 2017-12-02 DIAGNOSIS — M79671 Pain in right foot: Secondary | ICD-10-CM | POA: Diagnosis not present

## 2017-12-02 DIAGNOSIS — M79672 Pain in left foot: Secondary | ICD-10-CM | POA: Diagnosis not present

## 2017-12-02 DIAGNOSIS — M109 Gout, unspecified: Secondary | ICD-10-CM | POA: Diagnosis not present

## 2017-12-02 DIAGNOSIS — I10 Essential (primary) hypertension: Secondary | ICD-10-CM | POA: Diagnosis not present

## 2017-12-02 DIAGNOSIS — Z794 Long term (current) use of insulin: Secondary | ICD-10-CM | POA: Diagnosis not present

## 2017-12-04 ENCOUNTER — Other Ambulatory Visit: Payer: Self-pay

## 2017-12-04 ENCOUNTER — Encounter: Payer: Self-pay | Admitting: Emergency Medicine

## 2017-12-04 ENCOUNTER — Emergency Department: Payer: PPO

## 2017-12-04 ENCOUNTER — Emergency Department
Admission: EM | Admit: 2017-12-04 | Discharge: 2017-12-04 | Disposition: A | Discharge status: Home / Self Care | Payer: PPO | Source: Home / Self Care | Attending: Emergency Medicine | Admitting: Emergency Medicine

## 2017-12-04 DIAGNOSIS — Z794 Long term (current) use of insulin: Secondary | ICD-10-CM | POA: Insufficient documentation

## 2017-12-04 DIAGNOSIS — I110 Hypertensive heart disease with heart failure: Secondary | ICD-10-CM | POA: Insufficient documentation

## 2017-12-04 DIAGNOSIS — F329 Major depressive disorder, single episode, unspecified: Secondary | ICD-10-CM | POA: Diagnosis not present

## 2017-12-04 DIAGNOSIS — J449 Chronic obstructive pulmonary disease, unspecified: Secondary | ICD-10-CM | POA: Insufficient documentation

## 2017-12-04 DIAGNOSIS — Z8546 Personal history of malignant neoplasm of prostate: Secondary | ICD-10-CM | POA: Diagnosis not present

## 2017-12-04 DIAGNOSIS — R079 Chest pain, unspecified: Secondary | ICD-10-CM | POA: Insufficient documentation

## 2017-12-04 DIAGNOSIS — G4733 Obstructive sleep apnea (adult) (pediatric): Secondary | ICD-10-CM | POA: Diagnosis not present

## 2017-12-04 DIAGNOSIS — R0600 Dyspnea, unspecified: Secondary | ICD-10-CM | POA: Diagnosis not present

## 2017-12-04 DIAGNOSIS — Z951 Presence of aortocoronary bypass graft: Secondary | ICD-10-CM | POA: Diagnosis not present

## 2017-12-04 DIAGNOSIS — E876 Hypokalemia: Secondary | ICD-10-CM | POA: Diagnosis not present

## 2017-12-04 DIAGNOSIS — K219 Gastro-esophageal reflux disease without esophagitis: Secondary | ICD-10-CM | POA: Diagnosis not present

## 2017-12-04 DIAGNOSIS — E119 Type 2 diabetes mellitus without complications: Secondary | ICD-10-CM

## 2017-12-04 DIAGNOSIS — Z79899 Other long term (current) drug therapy: Secondary | ICD-10-CM | POA: Insufficient documentation

## 2017-12-04 DIAGNOSIS — I5022 Chronic systolic (congestive) heart failure: Secondary | ICD-10-CM | POA: Insufficient documentation

## 2017-12-04 DIAGNOSIS — Z955 Presence of coronary angioplasty implant and graft: Secondary | ICD-10-CM | POA: Diagnosis not present

## 2017-12-04 DIAGNOSIS — Z7902 Long term (current) use of antithrombotics/antiplatelets: Secondary | ICD-10-CM | POA: Insufficient documentation

## 2017-12-04 DIAGNOSIS — I252 Old myocardial infarction: Secondary | ICD-10-CM | POA: Insufficient documentation

## 2017-12-04 DIAGNOSIS — Z87891 Personal history of nicotine dependence: Secondary | ICD-10-CM

## 2017-12-04 DIAGNOSIS — I2510 Atherosclerotic heart disease of native coronary artery without angina pectoris: Secondary | ICD-10-CM

## 2017-12-04 DIAGNOSIS — R0602 Shortness of breath: Secondary | ICD-10-CM

## 2017-12-04 DIAGNOSIS — N400 Enlarged prostate without lower urinary tract symptoms: Secondary | ICD-10-CM | POA: Diagnosis not present

## 2017-12-04 DIAGNOSIS — I248 Other forms of acute ischemic heart disease: Secondary | ICD-10-CM | POA: Diagnosis not present

## 2017-12-04 DIAGNOSIS — E7800 Pure hypercholesterolemia, unspecified: Secondary | ICD-10-CM | POA: Diagnosis not present

## 2017-12-04 DIAGNOSIS — I509 Heart failure, unspecified: Secondary | ICD-10-CM | POA: Diagnosis not present

## 2017-12-04 DIAGNOSIS — I5023 Acute on chronic systolic (congestive) heart failure: Secondary | ICD-10-CM | POA: Diagnosis not present

## 2017-12-04 DIAGNOSIS — I5020 Unspecified systolic (congestive) heart failure: Secondary | ICD-10-CM | POA: Diagnosis not present

## 2017-12-04 DIAGNOSIS — I208 Other forms of angina pectoris: Secondary | ICD-10-CM | POA: Diagnosis not present

## 2017-12-04 DIAGNOSIS — Z95810 Presence of automatic (implantable) cardiac defibrillator: Secondary | ICD-10-CM | POA: Diagnosis not present

## 2017-12-04 DIAGNOSIS — I255 Ischemic cardiomyopathy: Secondary | ICD-10-CM | POA: Diagnosis not present

## 2017-12-04 DIAGNOSIS — I10 Essential (primary) hypertension: Secondary | ICD-10-CM | POA: Diagnosis not present

## 2017-12-04 LAB — CBC
HCT: 28.7 % — ABNORMAL LOW (ref 40.0–52.0)
HEMOGLOBIN: 9.7 g/dL — AB (ref 13.0–18.0)
MCH: 31 pg (ref 26.0–34.0)
MCHC: 33.8 g/dL (ref 32.0–36.0)
MCV: 91.9 fL (ref 80.0–100.0)
Platelets: 169 10*3/uL (ref 150–440)
RBC: 3.12 MIL/uL — AB (ref 4.40–5.90)
RDW: 15.2 % — ABNORMAL HIGH (ref 11.5–14.5)
WBC: 12.5 10*3/uL — ABNORMAL HIGH (ref 3.8–10.6)

## 2017-12-04 LAB — BASIC METABOLIC PANEL
ANION GAP: 13 (ref 5–15)
BUN: 16 mg/dL (ref 6–20)
CALCIUM: 6.5 mg/dL — AB (ref 8.9–10.3)
CO2: 24 mmol/L (ref 22–32)
Chloride: 101 mmol/L (ref 101–111)
Creatinine, Ser: 1.27 mg/dL — ABNORMAL HIGH (ref 0.61–1.24)
GFR, EST NON AFRICAN AMERICAN: 55 mL/min — AB (ref >60)
Glucose, Bld: 259 mg/dL — ABNORMAL HIGH (ref 65–99)
Potassium: 3 mmol/L — ABNORMAL LOW (ref 3.5–5.1)
Sodium: 138 mmol/L (ref 135–145)

## 2017-12-04 LAB — TROPONIN I
TROPONIN I: 0.03 ng/mL — AB (ref <0.03)
TROPONIN I: 0.04 ng/mL — AB (ref <0.03)

## 2017-12-04 MED ORDER — ASPIRIN 81 MG PO CHEW
162.0000 mg | CHEWABLE_TABLET | Freq: Once | ORAL | Status: AC
  Administered 2017-12-04: 162 mg via ORAL
  Filled 2017-12-04: qty 2

## 2017-12-04 MED ORDER — FUROSEMIDE 40 MG PO TABS
80.0000 mg | ORAL_TABLET | Freq: Once | ORAL | Status: AC
  Administered 2017-12-04: 80 mg via ORAL
  Filled 2017-12-04: qty 2

## 2017-12-04 NOTE — ED Triage Notes (Signed)
Pt reports that he woke up around an hour ago with left chest pain that radiates down left arm. Pt reports hx of cardiac condition. Pt denies other cardiac symptoms at this time.

## 2017-12-04 NOTE — ED Provider Notes (Signed)
[facility] Emergency Department Provider Note  ____________________________________________   First MD Initiated Contact with Patient 12/04/17 [phone]     (approximate)  I have reviewed the triage vital signs and the nursing notes.   HISTORY  Chief Complaint Chest Pain   HPI [name] is a 71 y.o. male self presents to the emergency department with shortness of breath and chest pain that began yesterday.  "I feel like I have pulmonary edema again.  He has a complex past medical history including coronary artery disease multiple myocardial infarctions and significant congestive heart failure.  He takes furosemide 20 mg twice a day his CHF.  He sleeps on 2 pillows and does not wake at night short of breath.  He does have some mild aching intermittent left-sided chest pain that began earlier today.  It is nonexertional.  It is nonradiating.  Nausea.  No diaphoresis.  02/02/16 Cath:  Prox RCA to Mid RCA lesion, 100% stenosed. The lesion was previously treated with a stent (unknown type).  Ost Cx to Prox Cx lesion, 95% stenosed.  Prox LAD to Mid LAD lesion, 100% stenosed. The lesion was previously treated with a stent (unknown type).  SVG was injected is normal in caliber.  There is moderate focal disease in the graft.  Origin lesion, 50% stenosed. The lesion was previously treated with a stent (unknown type).  SVG was injected is moderate in size, and is anatomically normal.  SVG was injected is moderate in size, and is anatomically normal.  LIMA was injected is small, and is anatomically normal.  Dist LAD lesion, 100% stenosed.  Ramus-1 lesion, 100% stenosed.  Ramus-2 lesion, 100% stenosed.   Past Medical History:  Diagnosis Date  . Anemia   . Cancer ([place]) 12/2013   prostate  . Cardiogenic pulmonary edema ([place]) 12/19/2014  . Cardiomyopathy, ischemic   . CHF (congestive heart failure) ([place])   . COPD (chronic obstructive pulmonary disease)  ([place])   . Diabetes mellitus without complication ([place])   . GERD (gastroesophageal reflux disease)   . Hypercholesteremia   . Hypertension   . Myocardial infarction ([place]) [id number]  . Shortness of breath dyspnea     Patient Active Problem List   Diagnosis Date Noted  . Acute on chronic systolic heart failure ([place]) 07/17/2017  . Flash pulmonary edema ([place]) 07/01/2017  . Acute on chronic systolic CHF (congestive heart failure) ([place]) 04/21/2017  . Acute on chronic respiratory failure with hypoxia ([place]) 04/21/2017  . Hypokalemia 04/21/2017  . Hypomagnesemia 04/21/2017  . Essential hypertension 04/21/2017  . Hyperlipidemia 04/21/2017  . Coronary artery disease 04/21/2017  . CHF (congestive heart failure) ([place]) 04/20/2017  . HTN (hypertension) 02/13/2016  . COPD with chronic bronchitis ([place]) 02/13/2016  . Obstructive sleep apnea 02/13/2016  . Diabetes ([place]) 02/13/2016  . Chronic systolic HF (heart failure) ([place]) 12/12/2015    Past Surgical History:  Procedure Laterality Date  . CARDIAC CATHETERIZATION N/A 02/02/2016   Procedure: Left Heart Cath and Coronary Angiography;  Surgeon: [name], MD;  Location: [facility];  Service: Cardiovascular;  Laterality: N/A;  . CORONARY ANGIOPLASTY WITH STENT PLACEMENT    . CORONARY ARTERY BYPASS GRAFT  11/24/2010  . IMPLANTABLE CARDIOVERTER DEFIBRILLATOR (ICD) GENERATOR CHANGE Left 12/12/2015   Procedure: DUAL LEAD PLACEMENT CARDIAC DIFIBRILLATOR;  Surgeon: [name], MD;  Location: [facility];  Service: Cardiovascular;  Laterality: Left;  . TONSILLECTOMY      Prior to Admission medications   Medication Sig  Start Date End Date Taking? Authorizing Provider  albuterol (PROVENTIL HFA;VENTOLIN HFA) 108 (90 Base) MCG/ACT inhaler Inhale 2 puffs into the lungs 4 (four) times daily as needed for wheezing or shortness of breath.    Yes [provider]  albuterol-ipratropium (COMBIVENT) 18-103 MCG/ACT inhaler Inhale 1 puff  into the lungs 2 times daily at 12 noon and 4 pm.   Yes [provider]  allopurinol (ZYLOPRIM) 100 MG tablet Take 100 mg by mouth daily.   Yes [provider]  atorvastatin (LIPITOR) 80 MG tablet Take 1 tablet (80 mg total) by mouth every evening. 04/21/17  Yes Theodoro [name], MD  budesonide-formoterol (SYMBICORT) 80-4.5 MCG/ACT inhaler Inhale 2 puffs into the lungs 2 (two) times daily.   Yes [provider]  calcium carbonate (TUMS - DOSED IN MG ELEMENTAL CALCIUM) 500 MG chewable tablet Chew 1 tablet by mouth as needed for indigestion or heartburn.   Yes [provider]  carvedilol (COREG) 6.25 MG tablet Take 6.25 mg by mouth 2 (two) times daily with a meal.   Yes [provider]  citalopram (CELEXA) 20 MG tablet Take 1 tablet by mouth 2 (two) times daily.   Yes [provider]  clopidogrel (PLAVIX) 75 MG tablet Take 75 mg by mouth every morning.   Yes [provider]  co-enzyme Q-10 30 MG capsule Take 30 mg by mouth daily.   Yes [provider]  ferrous sulfate 325 (65 FE) MG tablet Take 325 mg by mouth 2 (two) times daily with a meal.   Yes [provider]  finasteride (PROSCAR) 5 MG tablet Take 5 mg by mouth at bedtime.   Yes [provider]  FLUoxetine (PROZAC) 20 MG capsule Take 20 mg by mouth daily.   Yes [provider]  gabapentin (NEURONTIN) 300 MG capsule Take 300 mg by mouth 2 (two) times daily.    Yes [provider]  insulin glargine (LANTUS) 100 UNIT/ML injection Inject 100 Units into the skin at bedtime.    Yes [provider]  insulin lispro (HUMALOG) 100 UNIT/ML KiwkPen Inject 0-15 Units into the skin 3 (three) times daily with meals. Per sliding scale   Yes [provider]  isosorbide mononitrate (IMDUR) 60 MG 24 hr tablet Take 60 mg by mouth daily.   Yes [provider]  lisinopril (PRINIVIL,ZESTRIL) 20 MG tablet Take 20 mg by mouth daily.    Yes  [provider]  loratadine (CLARITIN) 10 MG tablet Take 10 mg by mouth daily.   Yes [provider]  Multiple Vitamins-Minerals (CENTRUM SILVER PO) Take 1 tablet by mouth every morning.   Yes [provider]  nitroGLYCERIN (NITROSTAT) 0.4 MG SL tablet Place 0.4 mg under the tongue every 5 (five) minutes as needed for chest pain.   Yes [provider]  pantoprazole (PROTONIX) 40 MG tablet Take 80 mg by mouth 2 (two) times daily.    Yes [provider]  ranolazine (RANEXA) 500 MG 12 hr tablet Take 500 mg by mouth 2 (two) times daily.   Yes [provider]  spironolactone (ALDACTONE) 25 MG tablet Take 12.5 mg by mouth daily.   Yes [provider]  tamsulosin (FLOMAX) 0.4 MG CAPS capsule Take 0.4 mg by mouth daily after supper.   Yes [provider]  torsemide (DEMADEX) 20 MG tablet Take 40 mg by mouth daily.   Yes [provider]  acetaminophen (TYLENOL) 325 MG tablet Take 2 tablets (650 mg total)  by mouth every 6 (six) hours as needed for mild pain (or Fever >/= 101). Patient not taking: Reported on 11/22/2017 02/02/16   Nicholes Mango, MD  azithromycin (ZITHROMAX) 250 MG tablet Take 1 tablet by mouth once daily for 4 days Patient not taking: Reported on 11/22/2017 07/19/17   Nicholes Mango, MD  azithromycin (ZITHROMAX) 250 MG tablet Take 1 tablet (250 mg total) by mouth daily. Patient not taking: Reported on 12/04/2017 11/22/17   Harvest Dark, MD  potassium chloride 20 MEQ TBCR Take 20 mEq by mouth daily. Patient not taking: Reported on 11/22/2017 07/18/17   Nicholes Mango, MD  predniSONE (DELTASONE) 20 MG tablet Take 2 tablets (40 mg total) by mouth daily. Patient not taking: Reported on 12/04/2017 11/22/17   Harvest Dark, MD    Allergies Benadryl [diphenhydramine]; Doxycycline; and Lopid [gemfibrozil]  Family History  Problem Relation Age of Onset  . CAD Mother   . Cancer Mother   . Diabetes Mother   .  Alzheimer's disease Father   . Cancer Father   . Heart disease Father     Social History Social History   Tobacco Use  . Smoking status: Former Smoker    Packs/day: 2.00    Years: 30.00    Pack years: 60.00    Last attempt to quit: 12/03/1996    Years since quitting: 21.0  . Smokeless tobacco: Never Used  Substance Use Topics  . Alcohol use: No    Alcohol/week: 0.0 oz  . Drug use: No    Review of Systems Constitutional: No fever/chills Eyes: No visual changes. ENT: No sore throat. Cardiovascular: Positive for chest pain. Respiratory: Positive for shortness of breath. Gastrointestinal: No abdominal pain.  No nausea, no vomiting.  No diarrhea.  No constipation. Genitourinary: Negative for dysuria. Musculoskeletal: Negative for back pain. Skin: Negative for rash. Neurological: Negative for headaches, focal weakness or numbness.   ____________________________________________   PHYSICAL EXAM:  VITAL SIGNS: ED Triage Vitals  Enc Vitals Group     BP 12/04/17 0150 (!) 136/52     Pulse Rate 12/04/17 0150 94     Resp 12/04/17 0150 20     Temp 12/04/17 0150 97.9 F (36.6 C)     Temp src --      SpO2 12/04/17 0150 96 %     Weight 12/04/17 0148 240 lb (108.9 kg)     Height 12/04/17 0148 5\' 8"  (1.727 m)     Head Circumference --      Peak Flow --      Pain Score 12/04/17 0147 4     Pain Loc --      Pain Edu? --      Excl. in [place]? --     Constitutional: Alert and oriented x4 well-appearing nontoxic no diaphoresis speaks in full clear sentences Eyes: PERRL EOMI. Head: Atraumatic. Nose: No congestion/rhinnorhea. Mouth/Throat: No trismus Neck: No stridor.  Able to lie completely flat with only slight JVD Cardiovascular: Normal rate, regular rhythm. Grossly normal heart sounds.  Good peripheral circulation.  Respiratory: Normal respiratory effort.  No retractions.  Faint crackles at bilateral bases Gastrointestinal: Obese soft nontender Musculoskeletal: Trace edema  bilateral lower extremities legs are equal in size Neurologic:  Normal speech and language. No gross focal neurologic deficits are appreciated. Skin:  Skin is warm, dry and intact. No rash noted. Psychiatric: Mood and affect are normal. Speech and behavior are normal.    ____________________________________________   DIFFERENTIAL includes but not limited to  Acute coronary  syndrome, congestive heart failure, pulmonary edema, aortic dissection ____________________________________________   LABS (all labs ordered are listed, but only abnormal results are displayed)  Labs Reviewed  BASIC METABOLIC PANEL - Abnormal; Notable for the following components:      Result Value   Potassium 3.0 (*)    Glucose, Bld 259 (*)    Creatinine, Ser 1.27 (*)    Calcium 6.5 (*)    GFR calc non Af Amer 55 (*)    All other components within normal limits  CBC - Abnormal; Notable for the following components:   WBC 12.5 (*)    RBC 3.12 (*)    Hemoglobin 9.7 (*)    HCT 28.7 (*)    RDW 15.2 (*)    All other components within normal limits  TROPONIN I - Abnormal; Notable for the following components:   Troponin I 0.03 (*)    All other components within normal limits  TROPONIN I - Abnormal; Notable for the following components:   Troponin I 0.04 (*)    All other components within normal limits    Lab work reviewed by me with slight elevation in troponin although stable __________________________________________  EKG  ED ECG REPORT I, Darel Hong, the attending physician, personally viewed and interpreted this ECG.  Date: 12/04/2017 EKG Time:  Rate: 94 Rhythm: Atrial sensed ventricular paced rhythm QRS Axis: normal Intervals: normal ST/T Wave abnormalities: normal Narrative Interpretation: no evidence of acute ischemia  ____________________________________________  RADIOLOGY  Chest x-ray reviewed by me with mild pulmonary  edema ____________________________________________   PROCEDURES  Procedure(s) performed: no  Procedures  Critical Care performed: no  Observation: no ____________________________________________   INITIAL IMPRESSION / ASSESSMENT AND PLAN / ED COURSE  Pertinent labs & imaging results that were available during my care of the patient were reviewed by me and considered in my medical decision making (see chart for details).  The patient arrives hemodynamically stable well-appearing with slight fluid overload and chest pain.  Differential is broad but includes acute coronary syndrome versus fluid overload versus atypical chest pain.  Blood work is pending.  After an additional dose of Lasix the patient's symptoms are improved.  Second troponin is only minimally elevated at 3-hour point.  At this point I do not believe the patient is having acute coronary syndrome and he will be stable for discharge home with follow-up in CHF clinic tomorrow.  Strict return precautions have been given and the patient verbalizes understanding and agreement with the plan.      ____________________________________________   FINAL CLINICAL IMPRESSION(S) / ED DIAGNOSES  Final diagnoses:  Chest pain, unspecified type  Shortness of breath      NEW MEDICATIONS STARTED DURING THIS VISIT:  This SmartLink is deprecated. Use AVSMEDLIST instead to display the medication list for a patient.   Note:  This document was prepared using Dragon voice recognition software and may include unintentional dictation errors.     Darel Hong, MD 12/04/17 [phone]

## 2017-12-04 NOTE — Discharge Instructions (Signed)
Please continue taking all of your medications as prescribed and follow-up in the CHF clinic tomorrow for a recheck.  Return to the emergency department sooner for any new or worsening symptoms such as worsening chest pain, shortness of breath, or for any other issues whatsoever.  It was a pleasure to take care of you today, and thank you for coming to our emergency department.  If you have any questions or concerns before leaving please ask the nurse to grab me and I'm more than happy to go through your aftercare instructions again.  If you were prescribed any opioid pain medication today such as Norco, Vicodin, Percocet, morphine, hydrocodone, or oxycodone please make sure you do not drive when you are taking this medication as it can alter your ability to drive safely.  If you have any concerns once you are home that you are not improving or are in fact getting worse before you can make it to your follow-up appointment, please do not hesitate to call 911 and come back for further evaluation.  Darel Hong, MD  Results for orders placed or performed during the hospital encounter of 29/52/84  Basic metabolic panel  Result Value Ref Range   Sodium 138 135 - 145 mmol/L   Potassium 3.0 (L) 3.5 - 5.1 mmol/L   Chloride 101 101 - 111 mmol/L   CO2 24 22 - 32 mmol/L   Glucose, Bld 259 (H) 65 - 99 mg/dL   BUN 16 6 - 20 mg/dL   Creatinine, Ser 1.27 (H) 0.61 - 1.24 mg/dL   Calcium 6.5 (L) 8.9 - 10.3 mg/dL   GFR calc non Af Amer 55 (L) >60 mL/min   GFR calc Af Amer >60 >60 mL/min   Anion gap 13 5 - 15  CBC  Result Value Ref Range   WBC 12.5 (H) 3.8 - 10.6 K/uL   RBC 3.12 (L) 4.40 - 5.90 MIL/uL   Hemoglobin 9.7 (L) 13.0 - 18.0 g/dL   HCT 28.7 (L) 40.0 - 52.0 %   MCV 91.9 80.0 - 100.0 fL   MCH 31.0 26.0 - 34.0 pg   MCHC 33.8 32.0 - 36.0 g/dL   RDW 15.2 (H) 11.5 - 14.5 %   Platelets 169 150 - 440 K/uL  Troponin I  Result Value Ref Range   Troponin I 0.03 (HH) <0.03 ng/mL  Troponin I    Result Value Ref Range   Troponin I 0.04 (HH) <0.03 ng/mL   Dg Chest 2 View  Result Date: 12/04/2017 CLINICAL DATA:  71 year old male with chest pain. EXAM: CHEST  2 VIEW COMPARISON:  Chest radiograph dated 11/22/2017 FINDINGS: Top-normal cardiac size and mild vascular congestion. No focal consolidation, pleural effusion, or pneumothorax. Median sternotomy wires, and left pectoral AICD device. No acute osseous pathology. IMPRESSION: Borderline cardiomegaly with probable mild vascular congestion. No focal consolidation. Electronically Signed   By: Anner Crete M.D.   On: 12/04/2017 02:30   Dg Chest 2 View  Result Date: 11/22/2017 CLINICAL DATA:  Shortness of breath. EXAM: CHEST  2 VIEW COMPARISON:  Chest x-ray dated July 16, 2017. FINDINGS: Unchanged left chest wall pacer device. Stable borderline cardiomegaly status post CABG. Pulmonary vascular congestion, similar to prior study. Basal predominant increased interstitial markings. No focal consolidation, pleural effusion, or pneumothorax. No acute osseous abnormality. IMPRESSION: 1. Stable borderline cardiomegaly, pulmonary vascular congestion, and probable mild interstitial pulmonary edema. Electronically Signed   By: Titus Dubin M.D.   On: 11/22/2017 14:47

## 2017-12-05 ENCOUNTER — Encounter: Payer: Self-pay | Admitting: Emergency Medicine

## 2017-12-05 ENCOUNTER — Other Ambulatory Visit: Payer: Self-pay

## 2017-12-05 ENCOUNTER — Telehealth: Payer: Self-pay

## 2017-12-05 ENCOUNTER — Emergency Department: Payer: PPO

## 2017-12-05 ENCOUNTER — Inpatient Hospital Stay
Admit: 2017-12-05 | Discharge: 2017-12-05 | Disposition: A | Discharge status: Home / Self Care | Payer: PPO | Attending: Internal Medicine | Admitting: Internal Medicine

## 2017-12-05 ENCOUNTER — Inpatient Hospital Stay
Admission: EM | Admit: 2017-12-05 | Discharge: 2017-12-06 | DRG: 292 | Disposition: A | Discharge status: Home / Self Care | Payer: PPO | Attending: Internal Medicine | Admitting: Internal Medicine

## 2017-12-05 DIAGNOSIS — Z794 Long term (current) use of insulin: Secondary | ICD-10-CM | POA: Diagnosis not present

## 2017-12-05 DIAGNOSIS — F329 Major depressive disorder, single episode, unspecified: Secondary | ICD-10-CM | POA: Diagnosis present

## 2017-12-05 DIAGNOSIS — J449 Chronic obstructive pulmonary disease, unspecified: Secondary | ICD-10-CM | POA: Diagnosis present

## 2017-12-05 DIAGNOSIS — E7800 Pure hypercholesterolemia, unspecified: Secondary | ICD-10-CM | POA: Diagnosis present

## 2017-12-05 DIAGNOSIS — I255 Ischemic cardiomyopathy: Secondary | ICD-10-CM | POA: Diagnosis present

## 2017-12-05 DIAGNOSIS — Z8546 Personal history of malignant neoplasm of prostate: Secondary | ICD-10-CM

## 2017-12-05 DIAGNOSIS — K219 Gastro-esophageal reflux disease without esophagitis: Secondary | ICD-10-CM | POA: Diagnosis present

## 2017-12-05 DIAGNOSIS — E119 Type 2 diabetes mellitus without complications: Secondary | ICD-10-CM | POA: Diagnosis present

## 2017-12-05 DIAGNOSIS — Z955 Presence of coronary angioplasty implant and graft: Secondary | ICD-10-CM | POA: Diagnosis not present

## 2017-12-05 DIAGNOSIS — E876 Hypokalemia: Secondary | ICD-10-CM | POA: Diagnosis present

## 2017-12-05 DIAGNOSIS — Z79899 Other long term (current) drug therapy: Secondary | ICD-10-CM | POA: Diagnosis not present

## 2017-12-05 DIAGNOSIS — N400 Enlarged prostate without lower urinary tract symptoms: Secondary | ICD-10-CM | POA: Diagnosis present

## 2017-12-05 DIAGNOSIS — G4733 Obstructive sleep apnea (adult) (pediatric): Secondary | ICD-10-CM | POA: Diagnosis present

## 2017-12-05 DIAGNOSIS — I5023 Acute on chronic systolic (congestive) heart failure: Secondary | ICD-10-CM | POA: Diagnosis present

## 2017-12-05 DIAGNOSIS — I248 Other forms of acute ischemic heart disease: Secondary | ICD-10-CM | POA: Diagnosis present

## 2017-12-05 DIAGNOSIS — I509 Heart failure, unspecified: Secondary | ICD-10-CM | POA: Diagnosis present

## 2017-12-05 DIAGNOSIS — I2510 Atherosclerotic heart disease of native coronary artery without angina pectoris: Secondary | ICD-10-CM | POA: Diagnosis present

## 2017-12-05 DIAGNOSIS — Z95810 Presence of automatic (implantable) cardiac defibrillator: Secondary | ICD-10-CM

## 2017-12-05 DIAGNOSIS — Z87891 Personal history of nicotine dependence: Secondary | ICD-10-CM

## 2017-12-05 DIAGNOSIS — I252 Old myocardial infarction: Secondary | ICD-10-CM

## 2017-12-05 DIAGNOSIS — Z7902 Long term (current) use of antithrombotics/antiplatelets: Secondary | ICD-10-CM | POA: Diagnosis not present

## 2017-12-05 DIAGNOSIS — Z951 Presence of aortocoronary bypass graft: Secondary | ICD-10-CM

## 2017-12-05 DIAGNOSIS — I110 Hypertensive heart disease with heart failure: Principal | ICD-10-CM | POA: Diagnosis present

## 2017-12-05 LAB — BASIC METABOLIC PANEL
Anion gap: 13 (ref 5–15)
BUN: 25 mg/dL — ABNORMAL HIGH (ref 6–20)
CHLORIDE: 101 mmol/L (ref 101–111)
CO2: 27 mmol/L (ref 22–32)
Calcium: 7.2 mg/dL — ABNORMAL LOW (ref 8.9–10.3)
Creatinine, Ser: 1.42 mg/dL — ABNORMAL HIGH (ref 0.61–1.24)
GFR, EST AFRICAN AMERICAN: 55 mL/min — AB (ref >60)
GFR, EST NON AFRICAN AMERICAN: 48 mL/min — AB (ref >60)
Glucose, Bld: 308 mg/dL — ABNORMAL HIGH (ref 65–99)
POTASSIUM: 3.4 mmol/L — AB (ref 3.5–5.1)
SODIUM: 141 mmol/L (ref 135–145)

## 2017-12-05 LAB — APTT: APTT: 25 s (ref 24–36)

## 2017-12-05 LAB — GLUCOSE, CAPILLARY
Glucose-Capillary: 178 mg/dL — ABNORMAL HIGH (ref 65–99)
Glucose-Capillary: 208 mg/dL — ABNORMAL HIGH (ref 65–99)
Glucose-Capillary: 173 mg/dL — ABNORMAL HIGH (ref 65–99)
Glucose-Capillary: 162 mg/dL — ABNORMAL HIGH (ref 65–99)

## 2017-12-05 LAB — PROTIME-INR
INR: 1.1
PROTHROMBIN TIME: 14.1 s (ref 11.4–15.2)

## 2017-12-05 LAB — HEMOGLOBIN A1C
Hgb A1c MFr Bld: 5.9 % — ABNORMAL HIGH (ref 4.8–5.6)
Mean Plasma Glucose: 122.63 mg/dL

## 2017-12-05 LAB — CBC
HEMATOCRIT: 32.5 % — AB (ref 40.0–52.0)
Hemoglobin: 10.7 g/dL — ABNORMAL LOW (ref 13.0–18.0)
MCH: 30.5 pg (ref 26.0–34.0)
MCHC: 33 g/dL (ref 32.0–36.0)
MCV: 92.5 fL (ref 80.0–100.0)
PLATELETS: 213 10*3/uL (ref 150–440)
RBC: 3.51 MIL/uL — AB (ref 4.40–5.90)
RDW: 15.2 % — ABNORMAL HIGH (ref 11.5–14.5)
WBC: 14.3 10*3/uL — AB (ref 3.8–10.6)

## 2017-12-05 LAB — TROPONIN I
TROPONIN I: 1.54 ng/mL — AB (ref <0.03)
TROPONIN I: 1.46 ng/mL — AB (ref <0.03)
Troponin I: 0.06 ng/mL (ref <0.03)
Troponin I: 1.39 ng/mL (ref <0.03)
Troponin I: 1.61 ng/mL (ref <0.03)

## 2017-12-05 LAB — TSH: TSH: 0.248 u[IU]/mL — ABNORMAL LOW (ref 0.350–4.500)

## 2017-12-05 LAB — BRAIN NATRIURETIC PEPTIDE: B NATRIURETIC PEPTIDE 5: 596 pg/mL — AB (ref 0.0–100.0)

## 2017-12-05 LAB — HEPARIN LEVEL (UNFRACTIONATED): Heparin Unfractionated: 0.3 IU/mL (ref 0.30–0.70)

## 2017-12-05 LAB — T4, FREE: FREE T4: 1.34 ng/dL — AB (ref 0.61–1.12)

## 2017-12-05 MED ORDER — SPIRONOLACTONE 25 MG PO TABS
12.5000 mg | ORAL_TABLET | Freq: Every day | ORAL | Status: DC
  Administered 2017-12-05 – 2017-12-06 (×2): 12.5 mg via ORAL
  Filled 2017-12-05: qty 1
  Filled 2017-12-05 (×2): qty 0.5

## 2017-12-05 MED ORDER — ALLOPURINOL 100 MG PO TABS
100.0000 mg | ORAL_TABLET | Freq: Every day | ORAL | Status: DC
  Administered 2017-12-05 – 2017-12-06 (×2): 100 mg via ORAL
  Filled 2017-12-05 (×2): qty 1

## 2017-12-05 MED ORDER — CARVEDILOL 6.25 MG PO TABS
6.2500 mg | ORAL_TABLET | Freq: Two times a day (BID) | ORAL | Status: DC
  Administered 2017-12-05 – 2017-12-06 (×2): 6.25 mg via ORAL
  Filled 2017-12-05 (×3): qty 1

## 2017-12-05 MED ORDER — INSULIN ASPART 100 UNIT/ML ~~LOC~~ SOLN
0.0000 [IU] | Freq: Three times a day (TID) | SUBCUTANEOUS | Status: DC
  Administered 2017-12-05: 5 [IU] via SUBCUTANEOUS
  Administered 2017-12-05 (×2): 3 [IU] via SUBCUTANEOUS
  Filled 2017-12-05 (×3): qty 1

## 2017-12-05 MED ORDER — TAMSULOSIN HCL 0.4 MG PO CAPS
0.4000 mg | ORAL_CAPSULE | Freq: Every day | ORAL | Status: DC
  Administered 2017-12-05: 0.4 mg via ORAL
  Filled 2017-12-05 (×2): qty 1

## 2017-12-05 MED ORDER — MOMETASONE FURO-FORMOTEROL FUM 100-5 MCG/ACT IN AERO
2.0000 | INHALATION_SPRAY | Freq: Two times a day (BID) | RESPIRATORY_TRACT | Status: DC
Start: 2017-12-05 — End: 2017-12-06
  Administered 2017-12-05 – 2017-12-06 (×3): 2 via RESPIRATORY_TRACT
  Filled 2017-12-05 (×2): qty 8.8

## 2017-12-05 MED ORDER — ONDANSETRON HCL 4 MG PO TABS
4.0000 mg | ORAL_TABLET | Freq: Four times a day (QID) | ORAL | Status: DC | PRN
  Filled 2017-12-05: qty 1

## 2017-12-05 MED ORDER — ONDANSETRON HCL 4 MG/2ML IJ SOLN: 4.0000 mg | Freq: Four times a day (QID) | INTRAMUSCULAR | Status: DC | PRN

## 2017-12-05 MED ORDER — PERFLUTREN LIPID MICROSPHERE
1.0000 mL | INTRAVENOUS | Status: AC | PRN
  Administered 2017-12-05: 1.5 mL via INTRAVENOUS
  Filled 2017-12-05: qty 10

## 2017-12-05 MED ORDER — INSULIN GLARGINE 100 UNIT/ML ~~LOC~~ SOLN
60.0000 [IU] | Freq: Every day | SUBCUTANEOUS | Status: DC
  Administered 2017-12-05: 60 [IU] via SUBCUTANEOUS
  Filled 2017-12-05 (×3): qty 0.6

## 2017-12-05 MED ORDER — CALCIUM CARBONATE ANTACID 500 MG PO CHEW
1.0000 | CHEWABLE_TABLET | ORAL | Status: DC | PRN
  Filled 2017-12-05: qty 1

## 2017-12-05 MED ORDER — LORATADINE 10 MG PO TABS
10.0000 mg | ORAL_TABLET | Freq: Every day | ORAL | Status: DC
  Administered 2017-12-05 – 2017-12-06 (×2): 10 mg via ORAL
  Filled 2017-12-05 (×2): qty 1

## 2017-12-05 MED ORDER — ACETAMINOPHEN 325 MG PO TABS: 650.0000 mg | ORAL_TABLET | Freq: Four times a day (QID) | ORAL | Status: DC | PRN

## 2017-12-05 MED ORDER — FUROSEMIDE 10 MG/ML IJ SOLN
40.0000 mg | Freq: Four times a day (QID) | INTRAMUSCULAR | Status: AC
  Administered 2017-12-05 (×2): 40 mg via INTRAVENOUS
  Filled 2017-12-05 (×3): qty 4

## 2017-12-05 MED ORDER — ISOSORBIDE MONONITRATE ER 60 MG PO TB24
60.0000 mg | ORAL_TABLET | Freq: Every day | ORAL | Status: DC
  Administered 2017-12-05 – 2017-12-06 (×2): 60 mg via ORAL
  Filled 2017-12-05 (×2): qty 1

## 2017-12-05 MED ORDER — ASPIRIN EC 325 MG PO TBEC
325.0000 mg | DELAYED_RELEASE_TABLET | Freq: Every day | ORAL | Status: DC
  Administered 2017-12-05 – 2017-12-06 (×2): 325 mg via ORAL
  Filled 2017-12-05 (×3): qty 1

## 2017-12-05 MED ORDER — FERROUS SULFATE 325 (65 FE) MG PO TABS
325.0000 mg | ORAL_TABLET | Freq: Two times a day (BID) | ORAL | Status: DC
  Administered 2017-12-05 – 2017-12-06 (×2): 325 mg via ORAL
  Filled 2017-12-05 (×2): qty 1

## 2017-12-05 MED ORDER — FUROSEMIDE 10 MG/ML IJ SOLN
40.0000 mg | Freq: Once | INTRAMUSCULAR | Status: AC
  Administered 2017-12-05: 40 mg via INTRAVENOUS
  Filled 2017-12-05: qty 4

## 2017-12-05 MED ORDER — HEPARIN BOLUS VIA INFUSION
4000.0000 [IU] | Freq: Once | INTRAVENOUS | Status: AC
  Administered 2017-12-05: 4000 [IU] via INTRAVENOUS
  Filled 2017-12-05: qty 4000

## 2017-12-05 MED ORDER — TORSEMIDE 20 MG PO TABS
40.0000 mg | ORAL_TABLET | Freq: Every day | ORAL | Status: DC
  Administered 2017-12-05: 40 mg via ORAL
  Filled 2017-12-05: qty 2

## 2017-12-05 MED ORDER — COENZYME Q10 30 MG PO CAPS: 30.0000 mg | ORAL_CAPSULE | Freq: Every day | ORAL | Status: DC

## 2017-12-05 MED ORDER — LISINOPRIL 20 MG PO TABS
20.0000 mg | ORAL_TABLET | Freq: Every day | ORAL | Status: DC
  Administered 2017-12-05 – 2017-12-06 (×2): 20 mg via ORAL
  Filled 2017-12-05: qty 2
  Filled 2017-12-05: qty 1

## 2017-12-05 MED ORDER — ATORVASTATIN CALCIUM 20 MG PO TABS
80.0000 mg | ORAL_TABLET | Freq: Every evening | ORAL | Status: DC
  Administered 2017-12-05: 80 mg via ORAL
  Filled 2017-12-05: qty 4
  Filled 2017-12-05: qty 1

## 2017-12-05 MED ORDER — FINASTERIDE 5 MG PO TABS
5.0000 mg | ORAL_TABLET | Freq: Every day | ORAL | Status: DC
  Administered 2017-12-05: 5 mg via ORAL
  Filled 2017-12-05 (×2): qty 1

## 2017-12-05 MED ORDER — ACETAMINOPHEN 650 MG RE SUPP
650.0000 mg | Freq: Four times a day (QID) | RECTAL | Status: DC | PRN
Start: 2017-12-05 — End: 2017-12-06
  Filled 2017-12-05: qty 1

## 2017-12-05 MED ORDER — DOCUSATE SODIUM 100 MG PO CAPS
100.0000 mg | ORAL_CAPSULE | Freq: Two times a day (BID) | ORAL | Status: DC
  Administered 2017-12-05 (×2): 100 mg via ORAL
  Filled 2017-12-05 (×3): qty 1

## 2017-12-05 MED ORDER — CITALOPRAM HYDROBROMIDE 20 MG PO TABS
20.0000 mg | ORAL_TABLET | Freq: Two times a day (BID) | ORAL | Status: DC
  Administered 2017-12-05 – 2017-12-06 (×3): 20 mg via ORAL
  Filled 2017-12-05 (×4): qty 1

## 2017-12-05 MED ORDER — PANTOPRAZOLE SODIUM 40 MG PO TBEC
80.0000 mg | DELAYED_RELEASE_TABLET | Freq: Two times a day (BID) | ORAL | Status: DC
  Administered 2017-12-05 – 2017-12-06 (×3): 80 mg via ORAL
  Filled 2017-12-05 (×3): qty 2

## 2017-12-05 MED ORDER — HEPARIN (PORCINE) IN NACL 100-0.45 UNIT/ML-% IJ SOLN
14.0000 [IU]/kg/h | INTRAMUSCULAR | Status: DC
  Administered 2017-12-05 – 2017-12-06 (×2): 14 [IU]/kg/h via INTRAVENOUS
  Filled 2017-12-05 (×3): qty 250

## 2017-12-05 MED ORDER — CLOPIDOGREL BISULFATE 75 MG PO TABS
75.0000 mg | ORAL_TABLET | ORAL | Status: DC
  Administered 2017-12-05 – 2017-12-06 (×2): 75 mg via ORAL
  Filled 2017-12-05 (×2): qty 1

## 2017-12-05 MED ORDER — FLUOXETINE HCL 20 MG PO CAPS
20.0000 mg | ORAL_CAPSULE | Freq: Every day | ORAL | Status: DC
  Administered 2017-12-05 – 2017-12-06 (×2): 20 mg via ORAL
  Filled 2017-12-05 (×2): qty 1

## 2017-12-05 MED ORDER — RANOLAZINE ER 500 MG PO TB12
500.0000 mg | ORAL_TABLET | Freq: Two times a day (BID) | ORAL | Status: DC
  Administered 2017-12-05 – 2017-12-06 (×3): 500 mg via ORAL
  Filled 2017-12-05 (×5): qty 1

## 2017-12-05 MED ORDER — NITROGLYCERIN 0.4 MG SL SUBL: 0.4000 mg | SUBLINGUAL_TABLET | SUBLINGUAL | Status: DC | PRN

## 2017-12-05 MED ORDER — HEPARIN SODIUM (PORCINE) 5000 UNIT/ML IJ SOLN
5000.0000 [IU] | Freq: Three times a day (TID) | INTRAMUSCULAR | Status: DC
  Filled 2017-12-05 (×2): qty 1

## 2017-12-05 MED ORDER — GABAPENTIN 300 MG PO CAPS
300.0000 mg | ORAL_CAPSULE | Freq: Two times a day (BID) | ORAL | Status: DC
  Administered 2017-12-05 – 2017-12-06 (×3): 300 mg via ORAL
  Filled 2017-12-05 (×4): qty 1

## 2017-12-05 NOTE — Progress Notes (Signed)
*  PRELIMINARY RESULTS* Echocardiogram 2D Echocardiogram has been performed. Definity administered. Patient tolerated contrast well. Howard Bunte  [facility] 12/05/2017, 6:40 PM

## 2017-12-05 NOTE — Progress Notes (Signed)
PHARMACIST - PHYSICIAN ORDER COMMUNICATION  CONCERNING: P&T Medication Policy on Herbal Medications  DESCRIPTION:  This patient's order for:  Co-Q-10  has been noted.  This product(s) is classified as an "herbal" or natural product. Due to a lack of definitive safety studies or FDA approval, nonstandard manufacturing practices, plus the potential risk of unknown drug-drug interactions while on inpatient medications, the Pharmacy and Therapeutics Committee does not permit the use of "herbal" or natural products of this type within [place].   ACTION TAKEN: The pharmacy department is unable to verify this order at this time. Please reevaluate patient's clinical condition at discharge and address if the herbal or natural product(s) should be resumed at that time.   

## 2017-12-05 NOTE — ED Notes (Signed)
MD shah notified of Patients Troponin 1.39 at this time  MD shah reports new orders for cardiology will be placed  Patient has no c/o chest pain at this time

## 2017-12-05 NOTE — ED Notes (Signed)
Pt given breakfast tray at this time. 

## 2017-12-05 NOTE — ED Provider Notes (Signed)
[facility] Emergency Department Provider Note   First MD Initiated Contact with Patient 12/05/17 0117     (approximate)  I have reviewed the triage vital signs and the nursing notes.   HISTORY  Chief Complaint Respiratory Distress    HPI [name] is a 72 y.o. male below list of chronic medical conditions including CHF presents to the emergency department with aggressive dyspnea since Saturday.  Patient was seen in the emergency department yesterday for the same.  Patient denies any chest pain that does admit to lower extremity swelling.  Per EMS on their arrival the patient's oxygen saturation was 89% on room air.  EMS states that the patient had tachypnea which is also present on arrival to the emergency department and as such CPAP was applied.   Past Medical History:  Diagnosis Date  . Anemia   . Cancer ([place]) 12/2013   prostate  . Cardiogenic pulmonary edema ([place]) 12/19/2014  . Cardiomyopathy, ischemic   . CHF (congestive heart failure) ([place])   . COPD (chronic obstructive pulmonary disease) ([place])   . Diabetes mellitus without complication ([place])   . GERD (gastroesophageal reflux disease)   . Hypercholesteremia   . Hypertension   . Myocardial infarction ([place]) [id number]  . Shortness of breath dyspnea     Patient Active Problem List   Diagnosis Date Noted  . Acute on chronic systolic heart failure ([place]) 07/17/2017  . Flash pulmonary edema ([place]) 07/01/2017  . Acute on chronic systolic CHF (congestive heart failure) ([place]) 04/21/2017  . Acute on chronic respiratory failure with hypoxia ([place]) 04/21/2017  . Hypokalemia 04/21/2017  . Hypomagnesemia 04/21/2017  . Essential hypertension 04/21/2017  . Hyperlipidemia 04/21/2017  . Coronary artery disease 04/21/2017  . CHF (congestive heart failure) ([place]) 04/20/2017  . HTN (hypertension) 02/13/2016  . COPD with chronic bronchitis ([place]) 02/13/2016  . Obstructive sleep apnea 02/13/2016  .  Diabetes ([place]) 02/13/2016  . Chronic systolic HF (heart failure) ([place]) 12/12/2015    Past Surgical History:  Procedure Laterality Date  . CARDIAC CATHETERIZATION N/A 02/02/2016   Procedure: Left Heart Cath and Coronary Angiography;  Surgeon: [name], MD;  Location: [facility];  Service: Cardiovascular;  Laterality: N/A;  . CORONARY ANGIOPLASTY WITH STENT PLACEMENT    . CORONARY ARTERY BYPASS GRAFT  11/24/2010  . IMPLANTABLE CARDIOVERTER DEFIBRILLATOR (ICD) GENERATOR CHANGE Left 12/12/2015   Procedure: DUAL LEAD PLACEMENT CARDIAC DIFIBRILLATOR;  Surgeon: [name], MD;  Location: [facility];  Service: Cardiovascular;  Laterality: Left;  . TONSILLECTOMY      Prior to Admission medications   Medication Sig Start Date End Date Taking? Authorizing Provider  albuterol (PROVENTIL HFA;VENTOLIN HFA) 108 (90 Base) MCG/ACT inhaler Inhale 2 puffs into the lungs 4 (four) times daily as needed for wheezing or shortness of breath.    Yes [provider]  albuterol-ipratropium (COMBIVENT) 18-103 MCG/ACT inhaler Inhale 1 puff into the lungs 2 times daily at 12 noon and 4 pm.   Yes [provider]  allopurinol (ZYLOPRIM) 100 MG tablet Take 100 mg by mouth daily.   Yes [provider]  atorvastatin (LIPITOR) 80 MG tablet Take 1 tablet (80 mg total) by mouth every evening. 04/21/17  Yes Theodoro [name], MD  budesonide-formoterol (SYMBICORT) 80-4.5 MCG/ACT inhaler Inhale 2 puffs into the lungs 2 (two) times daily.   Yes [provider]  calcium carbonate (TUMS - DOSED IN MG ELEMENTAL CALCIUM) 500 MG chewable tablet Chew 1 tablet by mouth  as needed for indigestion or heartburn.   Yes [provider]  carvedilol (COREG) 6.25 MG tablet Take 6.25 mg by mouth 2 (two) times daily with a meal.   Yes [provider]  citalopram (CELEXA) 20 MG tablet Take 1 tablet by mouth 2 (two) times daily.   Yes [provider]  clopidogrel (PLAVIX) 75 MG  tablet Take 75 mg by mouth every morning.   Yes [provider]  co-enzyme Q-10 30 MG capsule Take 30 mg by mouth daily.   Yes [provider]  ferrous sulfate 325 (65 FE) MG tablet Take 325 mg by mouth 2 (two) times daily with a meal.   Yes [provider]  finasteride (PROSCAR) 5 MG tablet Take 5 mg by mouth at bedtime.   Yes [provider]  FLUoxetine (PROZAC) 20 MG capsule Take 20 mg by mouth daily.   Yes [provider]  gabapentin (NEURONTIN) 300 MG capsule Take 300 mg by mouth 2 (two) times daily.    Yes [provider]  insulin glargine (LANTUS) 100 UNIT/ML injection Inject 100 Units into the skin at bedtime.    Yes [provider]  insulin lispro (HUMALOG) 100 UNIT/ML KiwkPen Inject 0-15 Units into the skin 3 (three) times daily with meals. Per sliding scale   Yes [provider]  isosorbide mononitrate (IMDUR) 60 MG 24 hr tablet Take 60 mg by mouth daily.   Yes [provider]  lisinopril (PRINIVIL,ZESTRIL) 20 MG tablet Take 20 mg by mouth daily.    Yes [provider]  loratadine (CLARITIN) 10 MG tablet Take 10 mg by mouth daily.   Yes [provider]  Multiple Vitamins-Minerals (CENTRUM SILVER PO) Take 1 tablet by mouth every morning.   Yes [provider]  nitroGLYCERIN (NITROSTAT) 0.4 MG SL tablet Place 0.4 mg under the tongue every 5 (five) minutes as needed for chest pain.   Yes [provider]  pantoprazole (PROTONIX) 40 MG tablet Take 80 mg by mouth 2 (two) times daily.    Yes [provider]  ranolazine (RANEXA) 500 MG 12 hr tablet Take 500 mg by mouth 2 (two) times daily.   Yes [provider]  spironolactone (ALDACTONE) 25 MG tablet Take 12.5 mg by mouth daily.   Yes [provider]  tamsulosin (FLOMAX) 0.4 MG CAPS capsule Take 0.4 mg by mouth daily after supper.   Yes [provider]  torsemide (DEMADEX) 20 MG tablet Take 40  mg by mouth daily.   Yes [provider]    Allergies Benadryl [diphenhydramine]; Doxycycline; and Lopid [gemfibrozil]  Family History  Problem Relation Age of Onset  . CAD Mother   . Cancer Mother   . Diabetes Mother   . Alzheimer's disease Father   . Cancer Father   . Heart disease Father     Social History Social History   Tobacco Use  . Smoking status: Former Smoker    Packs/day: 2.00    Years: 30.00    Pack years: 60.00    Last attempt to quit: 12/03/1996    Years since quitting: 21.0  . Smokeless tobacco: Never Used  Substance Use Topics  . Alcohol use: No    Alcohol/week: 0.0 oz  . Drug use: No    Review of Systems Constitutional: No fever/chills Eyes: No visual changes. ENT: No sore throat. Cardiovascular: Denies chest pain. Respiratory: Positive for dyspnea shortness of breath. Gastrointestinal: No abdominal pain.  No nausea, no vomiting.  No diarrhea.  No constipation. Genitourinary: Negative for dysuria. Musculoskeletal: Negative for neck pain.  Negative for back pain.  Positive for lower extremity swelling Integumentary: Negative for rash. Neurological: Negative for headaches, focal weakness or numbness.  ____________________________________________   PHYSICAL EXAM:  VITAL SIGNS: ED Triage Vitals  Enc Vitals Group     BP 12/05/17 0130 133/71     Pulse Rate 12/05/17 0200 (!) 103     Resp 12/05/17 0130 (!) 22     Temp --      Temp src --      SpO2 12/05/17 0200 100 %     Weight 12/05/17 0123 112.5 kg (248 lb)     Height --      Head Circumference --      Peak Flow --      Pain Score --      Pain Loc --      Pain Edu? --      Excl. in [place]? --     Constitutional: Alert and oriented. Well appearing and in no acute distress. Eyes: Conjunctivae are normal. Head: Atraumatic. Mouth/Throat: Mucous membranes are moist.  Oropharynx non-erythematous. Neck: No stridor.   Cardiovascular: Normal rate, regular rhythm. Good peripheral  circulation. Grossly normal heart sounds. Respiratory: Tachypnea positive accessory respiratory muscle use, bibasilar rales. Gastrointestinal: Soft and nontender. No distention.  Musculoskeletal: 1+ bilateral lower extremity pitting edema.. No gross deformities of extremities. Neurologic:  Normal speech and language. No gross focal neurologic deficits are appreciated.  Skin:  Skin is warm, dry and intact. No rash noted. Psychiatric: Mood and affect are normal. Speech and behavior are normal.**}  ____________________________________________   LABS (all labs ordered are listed, but only abnormal results are displayed)  Labs Reviewed  BASIC METABOLIC PANEL - Abnormal; Notable for the following components:      Result Value   Potassium 3.4 (*)    Glucose, Bld 308 (*)    BUN 25 (*)    Creatinine, Ser 1.42 (*)    Calcium 7.2 (*)    GFR calc non Af Amer 48 (*)    GFR calc Af Amer 55 (*)    All other components within normal limits  CBC - Abnormal; Notable for the following components:   WBC 14.3 (*)    RBC 3.51 (*)    Hemoglobin 10.7 (*)    HCT 32.5 (*)    RDW 15.2 (*)    All other components within normal limits  TROPONIN I - Abnormal; Notable for the following components:   Troponin I 0.06 (*)    All other components within normal limits  BRAIN NATRIURETIC PEPTIDE - Abnormal; Notable for the following components:   B Natriuretic Peptide 596.0 (*)    All other components within normal limits   ____________________________________________  EKG  ED ECG REPORT I, Vanduser N BROWN, the attending physician, personally viewed and interpreted this ECG.   Date: 12/05/2017  EKG Time: 1:28 AM  Rate: 104  Rhythm: Atrial paced rhythm  Axis: Normal  Intervals: Normal  ST&T Change: None ______________________________________  RADIOLOGY I, Loxley N BROWN, personally viewed and evaluated these images (plain radiographs) as part of my medical decision making, as well as reviewing  the written report by the radiologist.  Dg Chest Portable 1 View  Result Date: 12/05/2017 CLINICAL DATA:  Acute onset of worsening shortness of breath. EXAM: PORTABLE CHEST 1 VIEW COMPARISON:  Chest radiograph performed 12/04/2017 FINDINGS: The lungs are well-aerated. Mild vascular congestion is noted. Mild  bibasilar opacities raise concern for pulmonary edema. There is no evidence of pleural effusion or pneumothorax. The cardiomediastinal silhouette is mildly enlarged. The patient is status post median sternotomy, with evidence of prior CABG. A pacemaker/AICD is noted overlying the left chest wall, with leads ending overlying the right atrium and right ventricle. No acute osseous abnormalities are seen. IMPRESSION: Mild vascular congestion and mild cardiomegaly. Bibasilar airspace opacities raise concern for pulmonary edema. Electronically Signed   By: Garald Balding M.D.   On: 12/05/2017 01:43    Care Procedures  Critical Care: CRITICAL CARE Performed by: Gregor Hams   Total critical care time: 30 minutes  Critical care time was exclusive of separately billable procedures and treating other patients.  Critical care was necessary to treat or prevent imminent or life-threatening deterioration.  Critical care was time spent personally by me on the following activities: development of treatment plan with patient and/or surrogate as well as nursing, discussions with consultants, evaluation of patient's response to treatment, examination of patient, obtaining history from patient or surrogate, ordering and performing treatments and interventions, ordering and review of laboratory studies, ordering and review of radiographic studies, pulse oximetry and re-evaluation of patient's condition.  ____________________________________________   INITIAL IMPRESSION / ASSESSMENT AND PLAN / ED COURSE  As part of my medical decision making, I reviewed the following data within the electronic medical  record: 71 year old male presented with history and physical exam consistent with acute CHF exacerbation.  BiPAP applied to the patient on arrival to the emergency department Lasix 40 mg IV given.  On reevaluation patient states that dyspnea markedly improved.  Patient no longer tachypneic.  Patient discussed with Dr. Marcille Blanco for hospital admission for further evaluation and management of acute on chronic CHF exacerbation ____________________________________________  FINAL CLINICAL IMPRESSION(S) / ED DIAGNOSES  Final diagnoses:  Acute on chronic congestive heart failure, unspecified heart failure type ([place])     MEDICATIONS GIVEN DURING THIS VISIT:  Medications  furosemide (LASIX) injection 40 mg (40 mg Intravenous Given 12/05/17 0128)     ED Discharge Orders    None       Note:  This document was prepared using Dragon voice recognition software and may include unintentional dictation errors.    Gregor Hams, MD 12/05/17 [phone]

## 2017-12-05 NOTE — Progress Notes (Signed)
Same day progress/follow up note    1.  CHF: Acute on chronic systolic congestive heart failure.  Continue diuresis with IV Lasix.   2.    Non-ST elevation MI: Troponin peaked at 1.54 continue trend troponin -Start full dose heparin, aspirin   Continue Plavix, Imdur and Ranexa -Obtain 2D echo and consult cardiology 3.  Hypertension: Controlled; continue carvedilol, lisinopril. 4.  Diabetes mellitus type 2: Check hemoglobin A1c; continue basal insulin is for hospital diet.  Sliding-scale insulin while hospitalized as well. 5.  Hyperlipidemia: Continue statin therapy 6.  COPD: Continue inhaled corticosteroid.  Albuterol as needed. 7.  BPH: Continue finasteride 8. Depression: Continue Celexa and Prozac 9.  DVT prophylaxis: Heparin 10.  GI prophylaxis: Pantoprazole per home regimen

## 2017-12-05 NOTE — Plan of Care (Signed)
  Progressing Activity: Risk for activity intolerance will decrease 12/05/2017 2357 - Progressing by Loran Senters, RN Safety: Ability to remain free from injury will improve 12/05/2017 2357 - Progressing by Loran Senters, RN Education: Ability to demonstrate management of disease process will improve 12/05/2017 2357 - Progressing by Loran Senters, RN Ability to verbalize understanding of medication therapies will improve 12/05/2017 2357 - Progressing by Loran Senters, RN Activity: Capacity to carry out activities will improve 12/05/2017 2357 - Progressing by Loran Senters, RN

## 2017-12-05 NOTE — ED Notes (Signed)
Pt awake and alert, denies any needs at this time. VS stable, pt comfortable

## 2017-12-05 NOTE — H&P (Addendum)
[name] is an 71 y.o. male.   Chief Complaint: Shortness of breath HPI: The patient with past medical history of CHF, CAD status post myocardial infarction, hypertension, COPD and hypercholesterolemia presents to the emergency department with shortness of breath.  The patient was just discharged from the hospital for CHF exacerbation.  He states that he had been feeling well when he became short of breath as he laid down in bed.  Patient had significant pulmonary edema on chest x-ray which prompted emergency department staff to initiate BiPAP in addition to Lasix.  The emergency department staff then called the hospitalist service for admission.  Past Medical History:  Diagnosis Date  . Anemia   . Cancer ([place]) 12/2013   prostate  . Cardiogenic pulmonary edema ([place]) 12/19/2014  . Cardiomyopathy, ischemic   . CHF (congestive heart failure) ([place])   . COPD (chronic obstructive pulmonary disease) ([place])   . Diabetes mellitus without complication ([place])   . GERD (gastroesophageal reflux disease)   . Hypercholesteremia   . Hypertension   . Myocardial infarction ([place]) [id number]  . Shortness of breath dyspnea     Past Surgical History:  Procedure Laterality Date  . CARDIAC CATHETERIZATION N/A 02/02/2016   Procedure: Left Heart Cath and Coronary Angiography;  Surgeon: [name], MD;  Location: [facility];  Service: Cardiovascular;  Laterality: N/A;  . CORONARY ANGIOPLASTY WITH STENT PLACEMENT    . CORONARY ARTERY BYPASS GRAFT  11/24/2010  . IMPLANTABLE CARDIOVERTER DEFIBRILLATOR (ICD) GENERATOR CHANGE Left 12/12/2015   Procedure: DUAL LEAD PLACEMENT CARDIAC DIFIBRILLATOR;  Surgeon: [name], MD;  Location: [facility];  Service: Cardiovascular;  Laterality: Left;  . TONSILLECTOMY      Family History  Problem Relation Age of Onset  . CAD Mother   . Cancer Mother   . Diabetes Mother   . Alzheimer's disease Father   . Cancer Father   . Heart disease Father     Social History:  reports that he quit smoking about 21 years ago. He has a 60.00 pack-year smoking history. he has never used smokeless tobacco. He reports that he does not drink alcohol or use drugs.  Allergies:  Allergies  Allergen Reactions  . Benadryl [Diphenhydramine] Other (See Comments)    " Hyperactivity"  . Doxycycline Swelling    Pt went into pulmonary edema.  . Lopid [Gemfibrozil] Swelling    "I gain 1 pound a day for 30 days."    Prior to Admission medications   Medication Sig Start Date End Date Taking? Authorizing Provider  albuterol (PROVENTIL HFA;VENTOLIN HFA) 108 (90 Base) MCG/ACT inhaler Inhale 2 puffs into the lungs 4 (four) times daily as needed for wheezing or shortness of breath.    Yes [provider]  albuterol-ipratropium (COMBIVENT) 18-103 MCG/ACT inhaler Inhale 1 puff into the lungs 2 times daily at 12 noon and 4 pm.   Yes [provider]  allopurinol (ZYLOPRIM) 100 MG tablet Take 100 mg by mouth daily.   Yes [provider]  atorvastatin (LIPITOR) 80 MG tablet Take 1 tablet (80 mg total) by mouth every evening. 04/21/17  Yes Theodoro [name], MD  budesonide-formoterol (SYMBICORT) 80-4.5 MCG/ACT inhaler Inhale 2 puffs into the lungs 2 (two) times daily.   Yes [provider]  calcium carbonate (TUMS - DOSED IN MG ELEMENTAL CALCIUM) 500 MG chewable tablet Chew 1 tablet by mouth as needed for indigestion or heartburn.   Yes [provider]  carvedilol (COREG) 6.25 MG tablet  Take 6.25 mg by mouth 2 (two) times daily with a meal.   Yes [provider]  citalopram (CELEXA) 20 MG tablet Take 1 tablet by mouth 2 (two) times daily.   Yes [provider]  clopidogrel (PLAVIX) 75 MG tablet Take 75 mg by mouth every morning.   Yes [provider]  co-enzyme Q-10 30 MG capsule Take 30 mg by mouth daily.   Yes [provider]  ferrous sulfate 325 (65 FE) MG tablet Take 325 mg by mouth 2 (two)  times daily with a meal.   Yes [provider]  finasteride (PROSCAR) 5 MG tablet Take 5 mg by mouth at bedtime.   Yes [provider]  FLUoxetine (PROZAC) 20 MG capsule Take 20 mg by mouth daily.   Yes [provider]  gabapentin (NEURONTIN) 300 MG capsule Take 300 mg by mouth 2 (two) times daily.    Yes [provider]  insulin glargine (LANTUS) 100 UNIT/ML injection Inject 100 Units into the skin at bedtime.    Yes [provider]  insulin lispro (HUMALOG) 100 UNIT/ML KiwkPen Inject 0-15 Units into the skin 3 (three) times daily with meals. Per sliding scale   Yes [provider]  isosorbide mononitrate (IMDUR) 60 MG 24 hr tablet Take 60 mg by mouth daily.   Yes [provider]  lisinopril (PRINIVIL,ZESTRIL) 20 MG tablet Take 20 mg by mouth daily.    Yes [provider]  loratadine (CLARITIN) 10 MG tablet Take 10 mg by mouth daily.   Yes [provider]  Multiple Vitamins-Minerals (CENTRUM SILVER PO) Take 1 tablet by mouth every morning.   Yes [provider]  nitroGLYCERIN (NITROSTAT) 0.4 MG SL tablet Place 0.4 mg under the tongue every 5 (five) minutes as needed for chest pain.   Yes [provider]  pantoprazole (PROTONIX) 40 MG tablet Take 80 mg by mouth 2 (two) times daily.    Yes [provider]  ranolazine (RANEXA) 500 MG 12 hr tablet Take 500 mg by mouth 2 (two) times daily.   Yes [provider]  spironolactone (ALDACTONE) 25 MG tablet Take 12.5 mg by mouth daily.   Yes [provider]  tamsulosin (FLOMAX) 0.4 MG CAPS capsule Take 0.4 mg by mouth daily after supper.   Yes [provider]  torsemide (DEMADEX) 20 MG tablet Take 40 mg by mouth daily.   Yes [provider]     Results for orders placed or performed during the hospital encounter of 12/05/17 (from the past 48 hour(s))  Basic metabolic panel     Status: Abnormal   Collection Time:  12/05/17  1:25 AM  Result Value Ref Range   Sodium 141 135 - 145 mmol/L   Potassium 3.4 (L) 3.5 - 5.1 mmol/L   Chloride 101 101 - 111 mmol/L   CO2 27 22 - 32 mmol/L   Glucose, Bld 308 (H) 65 - 99 mg/dL   BUN 25 (H) 6 - 20 mg/dL   Creatinine, Ser 1.42 (H) 0.61 - 1.24 mg/dL   Calcium 7.2 (L) 8.9 - 10.3 mg/dL   GFR calc non Af Amer 48 (L) >60 mL/min   GFR calc Af Amer 55 (L) >60 mL/min    Comment: (NOTE) The eGFR has been calculated using the CKD EPI equation. This calculation has not been validated in all clinical situations. eGFR's persistently <60 mL/min signify possible Chronic Kidney Disease.    Anion gap 13 5 - 15  Comment: Performed at [facility], [address] [place], [place] [address]  CBC     Status: Abnormal   Collection Time: 12/05/17  1:25 AM  Result Value Ref Range   WBC 14.3 (H) 3.8 - 10.6 K/uL   RBC 3.51 (L) 4.40 - 5.90 MIL/uL   Hemoglobin 10.7 (L) 13.0 - 18.0 g/dL   HCT 32.5 (L) 40.0 - 52.0 %   MCV 92.5 80.0 - 100.0 fL   MCH 30.5 26.0 - 34.0 pg   MCHC 33.0 32.0 - 36.0 g/dL   RDW 15.2 (H) 11.5 - 14.5 %   Platelets 213 150 - 440 K/uL    Comment: Performed at [facility], [address] [place], [place] [address]  Troponin I     Status: Abnormal   Collection Time: 12/05/17  1:25 AM  Result Value Ref Range   Troponin I 0.06 (HH) <0.03 ng/mL    Comment: CRITICAL RESULT CALLED TO, READ BACK BY AND VERIFIED WITH VANESSA ASHLEY AT 0208 12/05/17.PMH Performed at [facility], [address] [place], [place] [address]   Brain natriuretic peptide     Status: Abnormal   Collection Time: 12/05/17  1:25 AM  Result Value Ref Range   B Natriuretic Peptide 596.0 (H) 0.0 - 100.0 pg/mL    Comment: Performed at [facility] [address], [place], [place] [address]   Dg Chest 2 View  Result Date: 12/04/2017 CLINICAL DATA:  71 year old male with chest pain. EXAM: CHEST  2 VIEW COMPARISON:  Chest radiograph dated 11/22/2017  FINDINGS: Top-normal cardiac size and mild vascular congestion. No focal consolidation, pleural effusion, or pneumothorax. Median sternotomy wires, and left pectoral AICD device. No acute osseous pathology. IMPRESSION: Borderline cardiomegaly with probable mild vascular congestion. No focal consolidation. Electronically Signed   By: Anner Crete M.D.   On: 12/04/2017 02:30   Dg Chest Portable 1 View  Result Date: 12/05/2017 CLINICAL DATA:  Acute onset of worsening shortness of breath. EXAM: PORTABLE CHEST 1 VIEW COMPARISON:  Chest radiograph performed 12/04/2017 FINDINGS: The lungs are well-aerated. Mild vascular congestion is noted. Mild bibasilar opacities raise concern for pulmonary edema. There is no evidence of pleural effusion or pneumothorax. The cardiomediastinal silhouette is mildly enlarged. The patient is status post median sternotomy, with evidence of prior CABG. A pacemaker/AICD is noted overlying the left chest wall, with leads ending overlying the right atrium and right ventricle. No acute osseous abnormalities are seen. IMPRESSION: Mild vascular congestion and mild cardiomegaly. Bibasilar airspace opacities raise concern for pulmonary edema. Electronically Signed   By: Garald Balding M.D.   On: 12/05/2017 01:43    Review of Systems  Constitutional: Negative for chills and fever.  HENT: Negative for sore throat and tinnitus.   Eyes: Negative for blurred vision and redness.  Respiratory: Positive for shortness of breath. Negative for cough.   Cardiovascular: Positive for orthopnea. Negative for chest pain, palpitations and PND.  Gastrointestinal: Negative for abdominal pain, diarrhea, nausea and vomiting.  Genitourinary: Negative for dysuria, frequency and urgency.  Musculoskeletal: Negative for joint pain and myalgias.  Skin: Negative for rash.       No lesions  Neurological: Negative for speech change, focal weakness and weakness.  Endo/Heme/Allergies: Does not bruise/bleed  easily.       No temperature intolerance  Psychiatric/Behavioral: Negative for depression and suicidal ideas.    Blood pressure 131/69, pulse (!) 39, resp. rate (!) 22, weight 112.5 kg (248 lb), SpO2 100 %. Physical  Exam  Constitutional: He is oriented to person, place, and time. He appears well-developed and well-nourished. No distress.  HENT:  Head: Normocephalic and atraumatic.  Mouth/Throat: Oropharynx is clear and moist.  Eyes: Conjunctivae and EOM are normal. Pupils are equal, round, and reactive to light. No scleral icterus.  Neck: Normal range of motion. Neck supple. No JVD present. No tracheal deviation present. No thyromegaly present.  Cardiovascular: Normal rate, regular rhythm and normal heart sounds. Exam reveals no gallop and no friction rub.  No murmur heard. Respiratory: Effort normal and breath sounds normal. No respiratory distress.  GI: Soft. Bowel sounds are normal. He exhibits no distension. There is no tenderness.  Genitourinary:  Genitourinary Comments: Deferred  Musculoskeletal: Normal range of motion. He exhibits no edema.  Lymphadenopathy:    He has no cervical adenopathy.  Neurological: He is alert and oriented to person, place, and time. No cranial nerve deficit.  Skin: Skin is warm and dry. No rash noted. No erythema.  Psychiatric: He has a normal mood and affect. His behavior is normal. Judgment and thought content normal.     Assessment/Plan Is a 71 year old male admitted for CHF exacerbation. 1.  CHF: Acute on chronic systolic congestive heart failure.  Continue diuresis with IV Lasix.  Coronal lactone per home regimen . He is weaned from BiPAP.  Supplemental oxygen as needed. 2.  CAD: Elevated troponin.  Likely secondary to demand ischemia.  Follow cardiac biomarkers.  Monitor telemetry.  Consult cardiology.  Continue Plavix, Imdur and Ranexa 3.  Hypertension: Controlled; continue carvedilol, lisinopril. 4.  Diabetes mellitus type 2: Check hemoglobin  A1c; continue basal insulin is for hospital diet.  Sliding-scale insulin while hospitalized as well. 5.  Hyperlipidemia: Continue statin therapy 6.  COPD: Continue inhaled corticosteroid.  Albuterol as needed. 7.  BPH: Continue finasteride 8. Depression: Continue Celexa and Prozac 9.  DVT prophylaxis: Heparin 10.  GI prophylaxis: Pantoprazole per home regimen The patient is a full code.  Time spent on admission orders and critical care approximately 45 minutes  Harrie Foreman, MD 12/05/2017, 8:32 AM

## 2017-12-05 NOTE — ED Triage Notes (Signed)
Pt arrived via EMS from home with difficulty breathing that started on Saturday. Pt was seen and released from ED on Saturday for chest pain. Pt sts, "I feel like I have pulmonary edema. I need to get this fluid off of me." Pt has hx/o COPD and is on CPAP machine at night 5L. Tonight when EMS arrived on scene pt was 89% on RA. Pt placed on CPAP machine and MD at bedside for further eval.

## 2017-12-05 NOTE — Plan of Care (Signed)
Pt admitted this shift from ED . A&Ox4. VSS. 2L O2 [place] . Wife at bedside. Heparin gtt started this shift per order. V paced on monitor. No complaints thus far. Will continue to monitor and report to oncoming RN .  Progressing Education: Knowledge of General Education information will improve 12/05/2017 1504 - Progressing by Aleen Campi, RN Health Behavior/Discharge Planning: Ability to manage health-related needs will improve 12/05/2017 1504 - Progressing by Aleen Campi, RN Clinical Measurements: Ability to maintain clinical measurements within normal limits will improve 12/05/2017 1504 - Progressing by Aleen Campi, RN Will remain free from infection 12/05/2017 1504 - Progressing by Aleen Campi, RN Diagnostic test results will improve 12/05/2017 1504 - Progressing by Aleen Campi, RN Respiratory complications will improve 12/05/2017 1504 - Progressing by Aleen Campi, RN Cardiovascular complication will be avoided 12/05/2017 1504 - Progressing by Aleen Campi, RN Activity: Risk for activity intolerance will decrease 12/05/2017 1504 - Progressing by Aleen Campi, RN Nutrition: Adequate nutrition will be maintained 12/05/2017 1504 - Progressing by Aleen Campi, RN Coping: Level of anxiety will decrease 12/05/2017 1504 - Progressing by Aleen Campi, RN Elimination: Will not experience complications related to bowel motility 12/05/2017 1504 - Progressing by Aleen Campi, RN Will not experience complications related to urinary retention 12/05/2017 1504 - Progressing by Aleen Campi, RN Pain Managment: General experience of comfort will improve 12/05/2017 1504 - Progressing by Aleen Campi, RN Safety: Ability to remain free from injury will improve 12/05/2017 1504 - Progressing by Aleen Campi, RN Skin Integrity: Risk for impaired skin integrity will decrease 12/05/2017 1504 - Progressing by Aleen Campi, RN

## 2017-12-05 NOTE — ED Notes (Signed)
Pt sleeping at this time, pt in NAD, RR even and unlabored, 100% on 4L [place]. Family at bedside asleep as well.

## 2017-12-05 NOTE — Significant Event (Signed)
Text page to Dr. Manuella Ghazi:  Rm 236 [place]- pt's troponin resulted 1.56 & had 3 beats of vtach. Asymptomatic. Strip in chart. Thanks.  No new orders at this time.

## 2017-12-05 NOTE — Progress Notes (Signed)
*  PRELIMINARY RESULTS* Echocardiogram 2D Echocardiogram has been performed. Definity administered. No side effects occurred.   [name]  [facility] 12/05/2017, 5:56 PM

## 2017-12-05 NOTE — Telephone Encounter (Signed)
Contacted patient in regards to referral received from ED, reached patients wife who states they have returned to the ED. She then transferred the phone to [place] a nurse in the ED that states the patient will be admitted. We will follow and schedule appointment with clinic post discharge.   Thank you for the referral.

## 2017-12-05 NOTE — Progress Notes (Signed)
ANTICOAGULATION CONSULT NOTE - Initial Consult  Pharmacy Consult for Heparin Indication: chest pain/ACS  Allergies  Allergen Reactions  . Benadryl [Diphenhydramine] Other (See Comments)    " Hyperactivity"  . Doxycycline Swelling    Pt went into pulmonary edema.  . Lopid [Gemfibrozil] Swelling    "I gain 1 pound a day for 30 days."    Patient Measurements: Height: 5\' 8"  (172.7 cm) Weight: 248 lb (112.5 kg) IBW/kg (Calculated) : 68.4 Heparin Dosing Weight: 94 kg  Vital Signs: BP: 119/66 (12/31 1030) Pulse Rate: 59 (12/31 1053)  Labs: Recent Labs    12/04/17 0151 12/04/17 0452 12/05/17 0125 12/05/17 1143  HGB 9.7*  --  10.7*  --   HCT 28.7*  --  32.5*  --   PLT 169  --  213  --   CREATININE 1.27*  --  1.42*  --   TROPONINI 0.03* 0.04* 0.06* 1.39*    Estimated Creatinine Clearance: 57.2 mL/min (A) (by C-G formula based on SCr of 1.42 mg/dL (H)).   Medical History: Past Medical History:  Diagnosis Date  . Anemia   . Cancer ([place]) 12/2013   prostate  . Cardiogenic pulmonary edema ([place]) 12/19/2014  . Cardiomyopathy, ischemic   . CHF (congestive heart failure) ([place])   . COPD (chronic obstructive pulmonary disease) ([place])   . Diabetes mellitus without complication ([place])   . GERD (gastroesophageal reflux disease)   . Hypercholesteremia   . Hypertension   . Myocardial infarction ([place]) [id number]  . Shortness of breath dyspnea     Assessment: 71 y/o M with a h/o CHF and CAD admitted with SOB and noted to have elevated troponin. Heparin drip initiated for r/o ACS.   Goal of Therapy:  Heparin level 0.3-0.7 units/ml Monitor platelets by anticoagulation protocol: Yes   Plan:  Give 4000 units bolus x 1 Start heparin infusion at 1300 units/hr Check anti-Xa level in 8 hours and daily while on heparin Continue to monitor H&H and platelets  Ulice Dash D 12/05/2017,1:19 PM

## 2017-12-06 LAB — TROPONIN I: Troponin I: 1.11 ng/mL (ref <0.03)

## 2017-12-06 LAB — HEPARIN LEVEL (UNFRACTIONATED): Heparin Unfractionated: 0.32 IU/mL (ref 0.30–0.70)

## 2017-12-06 LAB — GLUCOSE, CAPILLARY
Glucose-Capillary: 57 mg/dL — ABNORMAL LOW (ref 65–99)
Glucose-Capillary: 62 mg/dL — ABNORMAL LOW (ref 65–99)
Glucose-Capillary: 96 mg/dL (ref 65–99)

## 2017-12-06 LAB — CBC
HEMATOCRIT: 29.4 % — AB (ref 40.0–52.0)
Hemoglobin: 10.3 g/dL — ABNORMAL LOW (ref 13.0–18.0)
MCH: 32.3 pg (ref 26.0–34.0)
MCHC: 35 g/dL (ref 32.0–36.0)
MCV: 92.1 fL (ref 80.0–100.0)
PLATELETS: 195 10*3/uL (ref 150–440)
RBC: 3.19 MIL/uL — ABNORMAL LOW (ref 4.40–5.90)
RDW: 15.5 % — AB (ref 11.5–14.5)
WBC: 10.2 10*3/uL (ref 3.8–10.6)

## 2017-12-06 LAB — T3, FREE: T3, Free: 2.9 pg/mL (ref 2.0–4.4)

## 2017-12-06 MED ORDER — SACUBITRIL-VALSARTAN 24-26 MG PO TABS: 1.0000 | ORAL_TABLET | Freq: Two times a day (BID) | ORAL | Status: DC

## 2017-12-06 MED ORDER — SACUBITRIL-VALSARTAN 24-26 MG PO TABS: 1.0000 | ORAL_TABLET | Freq: Two times a day (BID) | ORAL | 0 refills | Status: DC

## 2017-12-06 NOTE — Consult Note (Signed)
Reason for Consult: Congestive heart failure shortness of breath dyspnea Referring Physician: Dr. Netty Starring primary Dr. Nehemiah Massed cardiologist  [name] is an 72 y.o. male.  HPI: Patient has a history of known coronary disease history of coronary bypass surgery PCI and stent unstable anginal symptoms with chest pain recurrently and chronic anginal symptoms on Ranexa.  Patient has had significant congestive heart failure symptoms ischemic cardiomyopathy COPD diabetes hyperlipidemia hypertension shortness of breath which got progressively worse over the last 12-24 hours prior to presentation.. Patient presented with acute pulmonary edema and was treated in the emergency room with diuretic therapy supplemental oxygen and had significant improvement.  Patient states of been compliant with his CPAP machine denies any dietary changes or increased salt intake.  Patient had some chest pain symptoms but no worse than previously denies any blackout spells or syncope has been compliant with his medications  Past Medical History:  Diagnosis Date  . Anemia   . Cancer ([place]) 12/2013   prostate  . Cardiogenic pulmonary edema ([place]) 12/19/2014  . Cardiomyopathy, ischemic   . CHF (congestive heart failure) ([place])   . COPD (chronic obstructive pulmonary disease) ([place])   . Diabetes mellitus without complication ([place])   . GERD (gastroesophageal reflux disease)   . Hypercholesteremia   . Hypertension   . Myocardial infarction ([place]) [id number]  . Shortness of breath dyspnea     Past Surgical History:  Procedure Laterality Date  . CARDIAC CATHETERIZATION N/A 02/02/2016   Procedure: Left Heart Cath and Coronary Angiography;  Surgeon: [name], MD;  Location: [facility];  Service: Cardiovascular;  Laterality: N/A;  . CORONARY ANGIOPLASTY WITH STENT PLACEMENT    . CORONARY ARTERY BYPASS GRAFT  11/24/2010  . IMPLANTABLE CARDIOVERTER DEFIBRILLATOR (ICD) GENERATOR CHANGE Left 12/12/2015    Procedure: DUAL LEAD PLACEMENT CARDIAC DIFIBRILLATOR;  Surgeon: [name], MD;  Location: [facility];  Service: Cardiovascular;  Laterality: Left;  . TONSILLECTOMY      Family History  Problem Relation Age of Onset  . CAD Mother   . Cancer Mother   . Diabetes Mother   . Alzheimer's disease Father   . Cancer Father   . Heart disease Father     Social History:  reports that he quit smoking about 21 years ago. He has a 60.00 pack-year smoking history. he has never used smokeless tobacco. He reports that he does not drink alcohol or use drugs.  Allergies:  Allergies  Allergen Reactions  . Benadryl [Diphenhydramine] Other (See Comments)    " Hyperactivity"  . Doxycycline Swelling    Pt went into pulmonary edema.  . Lopid [Gemfibrozil] Swelling    "I gain 1 pound a day for 30 days."    Medications: I have reviewed the patient's current medications.  Results for orders placed or performed during the hospital encounter of 12/05/17 (from the past 48 hour(s))  Basic metabolic panel     Status: Abnormal   Collection Time: 12/05/17  1:25 AM  Result Value Ref Range   Sodium 141 135 - 145 mmol/L   Potassium 3.4 (L) 3.5 - 5.1 mmol/L   Chloride 101 101 - 111 mmol/L   CO2 27 22 - 32 mmol/L   Glucose, Bld 308 (H) 65 - 99 mg/dL   BUN 25 (H) 6 - 20 mg/dL   Creatinine, Ser 1.42 (H) 0.61 - 1.24 mg/dL   Calcium 7.2 (L) 8.9 - 10.3 mg/dL   GFR calc non Af Amer 48 (L) >60 mL/min  GFR calc Af Amer 55 (L) >60 mL/min    Comment: (NOTE) The eGFR has been calculated using the CKD EPI equation. This calculation has not been validated in all clinical situations. eGFR's persistently <60 mL/min signify possible Chronic Kidney Disease.    Anion gap 13 5 - 15    Comment: Performed at [facility], [address] [place], [place] [address]  CBC     Status: Abnormal   Collection Time: 12/05/17  1:25 AM  Result Value Ref Range   WBC 14.3 (H) 3.8 - 10.6 K/uL   RBC 3.51 (L) 4.40 - 5.90  MIL/uL   Hemoglobin 10.7 (L) 13.0 - 18.0 g/dL   HCT 32.5 (L) 40.0 - 52.0 %   MCV 92.5 80.0 - 100.0 fL   MCH 30.5 26.0 - 34.0 pg   MCHC 33.0 32.0 - 36.0 g/dL   RDW 15.2 (H) 11.5 - 14.5 %   Platelets 213 150 - 440 K/uL    Comment: Performed at [facility], [address] [place], [place] [address]  Troponin I     Status: Abnormal   Collection Time: 12/05/17  1:25 AM  Result Value Ref Range   Troponin I 0.06 (HH) <0.03 ng/mL    Comment: CRITICAL RESULT CALLED TO, READ BACK BY AND VERIFIED WITH VANESSA ASHLEY AT 0208 12/05/17.PMH Performed at [facility], [address] [place], [place] [address]   Brain natriuretic peptide     Status: Abnormal   Collection Time: 12/05/17  1:25 AM  Result Value Ref Range   B Natriuretic Peptide 596.0 (H) 0.0 - 100.0 pg/mL    Comment: Performed at [facility], [address] [place], [place] [address]  Glucose, capillary     Status: Abnormal   Collection Time: 12/05/17  9:16 AM  Result Value Ref Range   Glucose-Capillary 178 (H) 65 - 99 mg/dL   Comment 1 Notify RN    Comment 2 Document in Chart   TSH     Status: Abnormal   Collection Time: 12/05/17 11:43 AM  Result Value Ref Range   TSH 0.248 (L) 0.350 - 4.500 uIU/mL    Comment: Performed by a 3rd Generation assay with a functional sensitivity of <=0.01 uIU/mL. Performed at [facility], [address] [place], [place] [address]   Troponin I     Status: Abnormal   Collection Time: 12/05/17 11:43 AM  Result Value Ref Range   Troponin I 1.39 (HH) <0.03 ng/mL    Comment: CRITICAL RESULT CALLED TO, READ BACK BY AND VERIFIED WITH AMBER JONES AT 1303 12/05/17 DAS Performed at [facility], [address] [place], [place] [address]   Glucose, capillary     Status: Abnormal   Collection Time: 12/05/17 11:47 AM  Result Value Ref Range   Glucose-Capillary 208 (H) 65 - 99 mg/dL   Comment 1 Notify RN    Comment 2 Document in Chart   APTT      Status: None   Collection Time: 12/05/17  1:33 PM  Result Value Ref Range   aPTT 25 24 - 36 seconds    Comment: Performed at [facility], [address] [place], [place] [address]  Protime-INR     Status: None   Collection Time: 12/05/17  1:33 PM  Result Value Ref Range   Prothrombin Time 14.1 11.4 - 15.2 seconds   INR 1.10     Comment: Performed at [facility], [address],  [place], [place] [address]  Troponin I     Status: Abnormal   Collection Time: 12/05/17  3:10 PM  Result Value Ref Range   Troponin I 1.54 (HH) <0.03 ng/mL    Comment: CRITICAL VALUE NOTED. VALUE IS CONSISTENT WITH PREVIOUSLY REPORTED/CALLED VALUE [facility] Performed at [facility], [address] [place], [place] [address]   Troponin I     Status: Abnormal   Collection Time: 12/05/17  5:10 PM  Result Value Ref Range   Troponin I 1.61 (HH) <0.03 ng/mL    Comment: CRITICAL VALUE NOTED. VALUE IS CONSISTENT WITH PREVIOUSLY REPORTED/CALLED VALUE [facility] Performed at [facility], [address] [place], [place] [address]   T3, free     Status: None   Collection Time: 12/05/17  5:10 PM  Result Value Ref Range   T3, Free 2.9 2.0 - 4.4 pg/mL    Comment: (NOTE) Performed At: [facility] [place], [place] [id number] Rush Farmer MD [id number] Performed at [facility], [address] [place], [place] [address]   T4, free     Status: Abnormal   Collection Time: 12/05/17  5:10 PM  Result Value Ref Range   Free T4 1.34 (H) 0.61 - 1.12 ng/dL    Comment: (NOTE) Biotin ingestion may interfere with free T4 tests. If the results are inconsistent with the TSH level, previous test results, or the clinical presentation, then consider biotin interference. If needed, order repeat testing after stopping biotin. Performed at [facility], [address] [place], [place] [address]   Hemoglobin A1c     Status: Abnormal   Collection  Time: 12/05/17  5:10 PM  Result Value Ref Range   Hgb A1c MFr Bld 5.9 (H) 4.8 - 5.6 %    Comment: (NOTE) Pre diabetes:          5.7%-6.4% Diabetes:              >6.4% Glycemic control for   <7.0% adults with diabetes    Mean Plasma Glucose 122.63 mg/dL    Comment: Performed at [facility] [address] [place], [place] [address]  Glucose, capillary     Status: Abnormal   Collection Time: 12/05/17  6:19 PM  Result Value Ref Range   Glucose-Capillary 173 (H) 65 - 99 mg/dL  Glucose, capillary     Status: Abnormal   Collection Time: 12/05/17  8:31 PM  Result Value Ref Range   Glucose-Capillary 162 (H) 65 - 99 mg/dL  Heparin level (unfractionated)     Status: None   Collection Time: 12/05/17 10:13 PM  Result Value Ref Range   Heparin Unfractionated 0.30 0.30 - 0.70 IU/mL    Comment:        IF HEPARIN RESULTS ARE BELOW EXPECTED VALUES, AND PATIENT DOSAGE HAS BEEN CONFIRMED, SUGGEST FOLLOW UP TESTING OF ANTITHROMBIN III LEVELS. Performed at [facility], [address] [place], [place] [address]   Troponin I     Status: Abnormal   Collection Time: 12/05/17 10:13 PM  Result Value Ref Range   Troponin I 1.46 (HH) <0.03 ng/mL    Comment: CRITICAL VALUE NOTED. VALUE IS CONSISTENT WITH PREVIOUSLY REPORTED/CALLED VALUE.MSS Performed at [facility], [address] [place], [place] [address]   Troponin I     Status: Abnormal   Collection Time: 12/06/17  5:02 AM  Result Value Ref Range   Troponin I 1.11 (HH) <0.03 ng/mL    Comment: CRITICAL VALUE  NOTED. VALUE IS CONSISTENT WITH PREVIOUSLY REPORTED/CALLED VALUE...[facility] Performed at [facility], [address] [place], [place] [address]   CBC     Status: Abnormal   Collection Time: 12/06/17  5:02 AM  Result Value Ref Range   WBC 10.2 3.8 - 10.6 K/uL   RBC 3.19 (L) 4.40 - 5.90 MIL/uL   Hemoglobin 10.3 (L) 13.0 - 18.0 g/dL   HCT 29.4 (L) 40.0 - 52.0 %   MCV 92.1 80.0 - 100.0 fL   MCH 32.3  26.0 - 34.0 pg   MCHC 35.0 32.0 - 36.0 g/dL   RDW 15.5 (H) 11.5 - 14.5 %   Platelets 195 150 - 440 K/uL    Comment: Performed at [facility], [place], [place] [address]  Heparin level (unfractionated)     Status: None   Collection Time: 12/06/17  5:02 AM  Result Value Ref Range   Heparin Unfractionated 0.32 0.30 - 0.70 IU/mL    Comment:        IF HEPARIN RESULTS ARE BELOW EXPECTED VALUES, AND PATIENT DOSAGE HAS BEEN CONFIRMED, SUGGEST FOLLOW UP TESTING OF ANTITHROMBIN III LEVELS. Performed at [facility], [address] [place], [place] [address]   Glucose, capillary     Status: Abnormal   Collection Time: 12/06/17  8:03 AM  Result Value Ref Range   Glucose-Capillary 57 (L) 65 - 99 mg/dL  Glucose, capillary     Status: Abnormal   Collection Time: 12/06/17  8:25 AM  Result Value Ref Range   Glucose-Capillary 62 (L) 65 - 99 mg/dL  Glucose, capillary     Status: None   Collection Time: 12/06/17 11:45 AM  Result Value Ref Range   Glucose-Capillary 96 65 - 99 mg/dL    Dg Chest Portable 1 View  Result Date: 12/05/2017 CLINICAL DATA:  Acute onset of worsening shortness of breath. EXAM: PORTABLE CHEST 1 VIEW COMPARISON:  Chest radiograph performed 12/04/2017 FINDINGS: The lungs are well-aerated. Mild vascular congestion is noted. Mild bibasilar opacities raise concern for pulmonary edema. There is no evidence of pleural effusion or pneumothorax. The cardiomediastinal silhouette is mildly enlarged. The patient is status post median sternotomy, with evidence of prior CABG. A pacemaker/AICD is noted overlying the left chest wall, with leads ending overlying the right atrium and right ventricle. No acute osseous abnormalities are seen. IMPRESSION: Mild vascular congestion and mild cardiomegaly. Bibasilar airspace opacities raise concern for pulmonary edema. Electronically Signed   By: Garald Balding M.D.   On: 12/05/2017 01:43    Review of Systems   Constitutional: Positive for diaphoresis and malaise/fatigue.  HENT: Positive for congestion.   Eyes: Negative.   Respiratory: Positive for cough and shortness of breath.   Cardiovascular: Positive for palpitations, orthopnea, leg swelling and PND.  Gastrointestinal: Negative.   Genitourinary: Negative.   Musculoskeletal: Negative.   Skin: Negative.   Neurological: Positive for weakness.  Endo/Heme/Allergies: Negative.   Psychiatric/Behavioral: Negative.    Blood pressure 113/62, pulse (!) 37, temperature (!) 97.4 F (36.3 C), temperature source Oral, resp. rate 18, height _0  (1.727 m), weight 243 lb 8 oz (110.5 kg), SpO2 99 %. Physical Exam  Nursing note and vitals reviewed. Constitutional: He is oriented to person, place, and time. He appears well-developed and well-nourished.  HENT:  Head: Normocephalic and atraumatic.  Eyes: Conjunctivae and EOM are normal. Pupils are equal, round, and reactive to light.  Neck: Normal range of motion. Neck supple.  Cardiovascular: Normal rate and  regular rhythm. Exam reveals gallop.  Murmur heard. Respiratory: Effort normal. He has decreased breath sounds. He has rhonchi.  GI: Soft. Bowel sounds are normal.  Musculoskeletal: Normal range of motion. He exhibits edema.  Neurological: He is alert and oriented to person, place, and time. He has normal reflexes.  Skin: Skin is warm and dry.  Psychiatric: He has a normal mood and affect.    Assessment/Plan: Congestive heart failure Cardiomyopathy Shortness of breath Congestion Coronary artery disease Angina Coronary artery bypass surgery Diabetes Chronic renal insufficiency Hypokalemia Borderline elevated troponin . Plan Agree with admit rule out for myocardial infarction The supplemental oxygen therapy for shortness of breath hypoxemia Continue IV diuretic therapy for heart failure Agree with CPAP for obstructive sleep apnea Continue therapy for angina including Ranexa Statin  therapy for hyperlipidemia Borderline troponins suggestive of demand ischemia Increase activity as symptoms improve Echocardiogram be helpful for assessment of left ventricular function    Danaisha Celli D Nicklous Aburto 12/06/2017, 6:53 PM

## 2017-12-06 NOTE — Progress Notes (Signed)
Pt to be discharged today. Iv and tele removed. Pt up in room on r/a. tol well. disch instructions given to pt and wife. Transport by family

## 2017-12-06 NOTE — Progress Notes (Signed)
ANTICOAGULATION CONSULT NOTE - Initial Consult  Pharmacy Consult for Heparin Indication: chest pain/ACS  Allergies  Allergen Reactions  . Benadryl [Diphenhydramine] Other (See Comments)    " Hyperactivity"  . Doxycycline Swelling    Pt went into pulmonary edema.  . Lopid [Gemfibrozil] Swelling    "I gain 1 pound a day for 30 days."    Patient Measurements: Height: 5\' 8"  (172.7 cm) Weight: 241 lb 14.4 oz (109.7 kg) IBW/kg (Calculated) : 68.4 Heparin Dosing Weight: 94 kg  Vital Signs: Temp: 98.1 F (36.7 C) (12/31 1930) Temp Source: Oral (12/31 1930) BP: 114/53 (12/31 1930) Pulse Rate: 81 (12/31 1930)  Labs: Recent Labs    12/04/17 0151  12/05/17 0125  12/05/17 1333 12/05/17 1510 12/05/17 1710 12/05/17 2213 12/06/17 0502  HGB 9.7*  --  10.7*  --   --   --   --   --  10.3*  HCT 28.7*  --  32.5*  --   --   --   --   --  29.4*  PLT 169  --  213  --   --   --   --   --  195  APTT  --   --   --   --  25  --   --   --   --   LABPROT  --   --   --   --  14.1  --   --   --   --   INR  --   --   --   --  1.10  --   --   --   --   HEPARINUNFRC  --   --   --   --   --   --   --  0.30 0.32  CREATININE 1.27*  --  1.42*  --   --   --   --   --   --   TROPONINI 0.03*   < > 0.06*   < >  --  1.54* 1.61* 1.46*  --    < > = values in this interval not displayed.    Estimated Creatinine Clearance: 56.5 mL/min (A) (by C-G formula based on SCr of 1.42 mg/dL (H)).   Medical History: Past Medical History:  Diagnosis Date  . Anemia   . Cancer ([place]) 12/2013   prostate  . Cardiogenic pulmonary edema ([place]) 12/19/2014  . Cardiomyopathy, ischemic   . CHF (congestive heart failure) ([place])   . COPD (chronic obstructive pulmonary disease) ([place])   . Diabetes mellitus without complication ([place])   . GERD (gastroesophageal reflux disease)   . Hypercholesteremia   . Hypertension   . Myocardial infarction ([place]) [id number]  . Shortness of breath dyspnea     Assessment: 72 y/o M with a  h/o CHF and CAD admitted with SOB and noted to have elevated troponin. Heparin drip initiated for r/o ACS.   Goal of Therapy:  Heparin level 0.3-0.7 units/ml Monitor platelets by anticoagulation protocol: Yes   Plan:  Give 4000 units bolus x 1 Start heparin infusion at 1300 units/hr Check anti-Xa level in 8 hours and daily while on heparin Continue to monitor H&H and platelets   0101 @ 0500 HL 0.32 therapeutic. Will continue current rate will recheck @ 1300.  Tobie Lords, PharmD, BCPS Clinical Pharmacist 12/06/2017

## 2017-12-06 NOTE — Discharge Instructions (Signed)
Heart Failure °Heart failure means your heart has trouble pumping blood. This makes it hard for your body to work well. Heart failure is usually a long-term (chronic) condition. You must take good care of yourself and follow your doctor's treatment plan. °Follow these instructions at home: °· Take your heart medicine as told by your doctor. °? Do not stop taking medicine unless your doctor tells you to. °? Do not skip any dose of medicine. °? Refill your medicines before they run out. °? Take other medicines only as told by your doctor or pharmacist. °· Stay active if told by your doctor. The elderly and people with severe heart failure should talk with a doctor about physical activity. °· Eat heart-healthy foods. Choose foods that are without trans fat and are low in saturated fat, cholesterol, and salt (sodium). This includes fresh or frozen fruits and vegetables, fish, lean meats, fat-free or low-fat dairy foods, whole grains, and high-fiber foods. Lentils and dried peas and beans (legumes) are also good choices. °· Limit salt if told by your doctor. °· Cook in a healthy way. Roast, grill, broil, bake, poach, steam, or stir-fry foods. °· Limit fluids as told by your doctor. °· Weigh yourself every morning. Do this after you pee (urinate) and before you eat breakfast. Write down your weight to give to your doctor. °· Take your blood pressure and write it down if your doctor tells you to. °· Ask your doctor how to check your pulse. Check your pulse as told. °· Lose weight if told by your doctor. °· Stop smoking or chewing tobacco. Do not use gum or patches that help you quit without your doctor's approval. °· Schedule and go to doctor visits as told. °· Nonpregnant women should have no more than 1 drink a day. Men should have no more than 2 drinks a day. Talk to your doctor about drinking alcohol. °· Stop illegal drug use. °· Stay current with shots (immunizations). °· Manage your health conditions as told by your  doctor. °· Learn to manage your stress. °· Rest when you are tired. °· If it is really hot outside: °? Avoid intense activities. °? Use air conditioning or fans, or get in a cooler place. °? Avoid caffeine and alcohol. °? Wear loose-fitting, lightweight, and light-colored clothing. °· If it is really cold outside: °? Avoid intense activities. °? Layer your clothing. °? Wear mittens or gloves, a hat, and a scarf when going outside. °? Avoid alcohol. °· Learn about heart failure and get support as needed. °· Get help to maintain or improve your quality of life and your ability to care for yourself as needed. °Contact a doctor if: °· You gain weight quickly. °· You are more short of breath than usual. °· You cannot do your normal activities. °· You tire easily. °· You cough more than normal, especially with activity. °· You have any or more puffiness (swelling) in areas such as your hands, feet, ankles, or belly (abdomen). °· You cannot sleep because it is hard to breathe. °· You feel like your heart is beating fast (palpitations). °· You get dizzy or light-headed when you stand up. °Get help right away if: °· You have trouble breathing. °· There is a change in mental status, such as becoming less alert or not being able to focus. °· You have chest pain or discomfort. °· You faint. °This information is not intended to replace advice given to you by your health care provider. Make sure you   discuss any questions you have with your health care provider. °Document Released: 08/31/2008 Document Revised: 04/29/2016 Document Reviewed: 01/08/2013 °Elsevier Interactive Patient Education © 2017 [employer] ° °

## 2017-12-08 LAB — ECHOCARDIOGRAM COMPLETE
Height: 68 in
Weight: 3870.4 oz

## 2017-12-08 NOTE — Discharge Summary (Signed)
[facility] NAME: [name]    MR#:  [id number]  DATE OF BIRTH:  09-23-1946  DATE OF ADMISSION:  12/05/2017   ADMITTING PHYSICIAN: Harrie Foreman, MD  DATE OF DISCHARGE: 12/06/2017  1:53 PM  PRIMARY CARE PHYSICIAN: [name], MD   ADMISSION DIAGNOSIS:  Acute on chronic congestive heart failure, unspecified heart failure type ([place]) [I50.9] DISCHARGE DIAGNOSIS:  Active Problems:   Acute on chronic systolic heart failure ([place])  SECONDARY DIAGNOSIS:   Past Medical History:  Diagnosis Date  . Anemia   . Cancer ([place]) 12/2013   prostate  . Cardiogenic pulmonary edema ([place]) 12/19/2014  . Cardiomyopathy, ischemic   . CHF (congestive heart failure) ([place])   . COPD (chronic obstructive pulmonary disease) ([place])   . Diabetes mellitus without complication ([place])   . GERD (gastroesophageal reflux disease)   . Hypercholesteremia   . Hypertension   . Myocardial infarction ([place]) [id number]  . Shortness of breath dyspnea    HOSPITAL COURSE:  72 y m with h/o CHF, CAD status post myocardial infarction, hypertension, COPD and hypercholesterolemia admitted for CHF exacerbation.    1. CHF: Acute on chronic systolic congestive heart failure. Well compensated at this time. 2. elevated troponins: due to supply demand ischemia. No MI. Continue Plavix, Imdur and Ranexa - echo still not read at D/C 3. Hypertension: Controlled; continue carvedilol, lisinopril. 4. Diabetes mellitus type 2:  5. Hyperlipidemia: Continue statin therapy 6. COPD: Continue inhaled corticosteroid. Albuterol as needed. 7. BPH: Continue finasteride 8.Depression: Continue Celexa and Prozac   After d/w cardio patient was D/Ced home as he was feeling much better. Patient and family agreeable with D/C plans. DISCHARGE CONDITIONS:  stable CONSULTS OBTAINED:  Treatment Team:  Yolonda Kida, MD DRUG ALLERGIES:   Allergies  Allergen  Reactions  . Benadryl [Diphenhydramine] Other (See Comments)    " Hyperactivity"  . Doxycycline Swelling    Pt went into pulmonary edema.  . Lopid [Gemfibrozil] Swelling    "I gain 1 pound a day for 30 days."   DISCHARGE MEDICATIONS:   Allergies as of 12/06/2017      Reactions   Benadryl [diphenhydramine] Other (See Comments)   " Hyperactivity"   Doxycycline Swelling   Pt went into pulmonary edema.   Lopid [gemfibrozil] Swelling   "I gain 1 pound a day for 30 days."      Medication List    STOP taking these medications   lisinopril 20 MG tablet Commonly known as:  PRINIVIL,ZESTRIL     TAKE these medications   albuterol 108 (90 Base) MCG/ACT inhaler Commonly known as:  PROVENTIL HFA;VENTOLIN HFA Inhale 2 puffs into the lungs 4 (four) times daily as needed for wheezing or shortness of breath.   albuterol-ipratropium 18-103 MCG/ACT inhaler Commonly known as:  COMBIVENT Inhale 1 puff into the lungs 2 times daily at 12 noon and 4 pm.   allopurinol 100 MG tablet Commonly known as:  ZYLOPRIM Take 100 mg by mouth daily.   atorvastatin 80 MG tablet Commonly known as:  LIPITOR Take 1 tablet (80 mg total) by mouth every evening.   budesonide-formoterol 80-4.5 MCG/ACT inhaler Commonly known as:  SYMBICORT Inhale 2 puffs into the lungs 2 (two) times daily.   calcium carbonate 500 MG chewable tablet Commonly known as:  TUMS - dosed in mg elemental calcium Chew 1 tablet by mouth as needed for indigestion or heartburn.   carvedilol 6.25 MG tablet Commonly known  as:  COREG Take 6.25 mg by mouth 2 (two) times daily with a meal.   CENTRUM SILVER PO Take 1 tablet by mouth every morning.   citalopram 20 MG tablet Commonly known as:  CELEXA Take 1 tablet by mouth 2 (two) times daily.   clopidogrel 75 MG tablet Commonly known as:  PLAVIX Take 75 mg by mouth every morning.   co-enzyme Q-10 30 MG capsule Take 30 mg by mouth daily.   ferrous sulfate 325 (65 FE) MG  tablet Take 325 mg by mouth 2 (two) times daily with a meal.   finasteride 5 MG tablet Commonly known as:  PROSCAR Take 5 mg by mouth at bedtime.   FLUoxetine 20 MG capsule Commonly known as:  PROZAC Take 20 mg by mouth daily.   gabapentin 300 MG capsule Commonly known as:  NEURONTIN Take 300 mg by mouth 2 (two) times daily.   insulin glargine 100 UNIT/ML injection Commonly known as:  LANTUS Inject 100 Units into the skin at bedtime.   insulin lispro 100 UNIT/ML KiwkPen Commonly known as:  HUMALOG Inject 0-15 Units into the skin 3 (three) times daily with meals. Per sliding scale   isosorbide mononitrate 60 MG 24 hr tablet Commonly known as:  IMDUR Take 60 mg by mouth daily.   loratadine 10 MG tablet Commonly known as:  CLARITIN Take 10 mg by mouth daily.   nitroGLYCERIN 0.4 MG SL tablet Commonly known as:  NITROSTAT Place 0.4 mg under the tongue every 5 (five) minutes as needed for chest pain.   pantoprazole 40 MG tablet Commonly known as:  PROTONIX Take 80 mg by mouth 2 (two) times daily.   ranolazine 500 MG 12 hr tablet Commonly known as:  RANEXA Take 500 mg by mouth 2 (two) times daily.   sacubitril-valsartan 24-26 MG Commonly known as:  ENTRESTO Take 1 tablet by mouth 2 (two) times daily.   spironolactone 25 MG tablet Commonly known as:  ALDACTONE Take 12.5 mg by mouth daily.   tamsulosin 0.4 MG Caps capsule Commonly known as:  FLOMAX Take 0.4 mg by mouth daily after supper.   torsemide 20 MG tablet Commonly known as:  DEMADEX Take 40 mg by mouth daily.        DISCHARGE INSTRUCTIONS:   DIET:  Regular diet DISCHARGE CONDITION:  Good ACTIVITY:  Activity as tolerated OXYGEN:  Home Oxygen: No.  Oxygen Delivery: room air DISCHARGE LOCATION:  home   If you experience worsening of your admission symptoms, develop shortness of breath, life threatening emergency, suicidal or homicidal thoughts you must seek medical attention immediately by  calling 911 or calling your MD immediately  if symptoms less severe.  You Must read complete instructions/literature along with all the possible adverse reactions/side effects for all the Medicines you take and that have been prescribed to you. Take any new Medicines after you have completely understood and accpet all the possible adverse reactions/side effects.   Please note  You were cared for by a hospitalist during your hospital stay. If you have any questions about your discharge medications or the care you received while you were in the hospital after you are discharged, you can call the unit and asked to speak with the hospitalist on call if the hospitalist that took care of you is not available. Once you are discharged, your primary care physician will handle any further medical issues. Please note that NO REFILLS for any discharge medications will be authorized once you are discharged, as  it is imperative that you return to your primary care physician (or establish a relationship with a primary care physician if you do not have one) for your aftercare needs so that they can reassess your need for medications and monitor your lab values.    On the day of Discharge:  VITAL SIGNS:  Blood pressure 113/62, pulse (!) 37, temperature (!) 97.4 F (36.3 C), temperature source Oral, resp. rate 18, height 5\' 8"  (1.727 m), weight 110.5 kg (243 lb 8 oz), SpO2 99 %. PHYSICAL EXAMINATION:  GENERAL:  72 y.o.-year-old patient lying in the bed with no acute distress.  EYES: Pupils equal, round, reactive to light and accommodation. No scleral icterus. Extraocular muscles intact.  HEENT: Head atraumatic, normocephalic. Oropharynx and nasopharynx clear.  NECK:  Supple, no jugular venous distention. No thyroid enlargement, no tenderness.  LUNGS: Normal breath sounds bilaterally, no wheezing, rales,rhonchi or crepitation. No use of accessory muscles of respiration.  CARDIOVASCULAR: S1, S2 normal. No murmurs,  rubs, or gallops.  ABDOMEN: Soft, non-tender, non-distended. Bowel sounds present. No organomegaly or mass.  EXTREMITIES: No pedal edema, cyanosis, or clubbing.  NEUROLOGIC: Cranial nerves II through XII are intact. Muscle strength 5/5 in all extremities. Sensation intact. Gait not checked.  PSYCHIATRIC: The patient is alert and oriented x 3.  SKIN: No obvious rash, lesion, or ulcer.  DATA REVIEW:   CBC Recent Labs  Lab 12/06/17 0502  WBC 10.2  HGB 10.3*  HCT 29.4*  PLT 195    Chemistries  Recent Labs  Lab 12/05/17 0125  NA 141  K 3.4*  CL 101  CO2 27  GLUCOSE 308*  BUN 25*  CREATININE 1.42*  CALCIUM 7.2*     Follow-up Information    [facility] Follow up on 12/13/2017.   Specialty:  Cardiology Why:  at 12:40pm Contact information: [address] [place] [phone]       [name], MD. Schedule an appointment as soon as possible for a visit in 1 week(s).   Specialty:  Family Medicine Contact information: [address] [place] [address] [phone]        [name], MD. Schedule an appointment as soon as possible for a visit in 2 week(s).   Specialty:  Cardiology Contact information: [address] [facility] [place] [address] [phone]           Management plans discussed with the patient, family and they are in agreement.  CODE STATUS: Prior   TOTAL TIME TAKING CARE OF THIS PATIENT: 45 minutes.    Max Sane M.D on 12/08/2017 at 2:42 PM  Between 7am to 6pm - Pager - [phone]  After 6pm go to [email or web address] - Proofreader  [facility]  Office  [phone]  CC: Primary care physician; [name], MD   Note: This dictation was prepared with Dragon dictation along with smaller phrase technology. Any transcriptional errors that  result from this process are unintentional.

## 2017-12-13 ENCOUNTER — Telehealth: Payer: Self-pay | Admitting: Family

## 2017-12-13 ENCOUNTER — Ambulatory Visit: Payer: PPO | Admitting: Family

## 2017-12-13 NOTE — Telephone Encounter (Signed)
Patient did not show for his Heart Failure Clinic appointment on 12/13/17. Will attempt to reschedule.   This is the 5th appointment that he has missed.

## 2017-12-19 DIAGNOSIS — I10 Essential (primary) hypertension: Secondary | ICD-10-CM | POA: Diagnosis not present

## 2017-12-19 DIAGNOSIS — I255 Ischemic cardiomyopathy: Secondary | ICD-10-CM | POA: Diagnosis not present

## 2017-12-19 DIAGNOSIS — R0609 Other forms of dyspnea: Secondary | ICD-10-CM | POA: Diagnosis not present

## 2017-12-19 DIAGNOSIS — Z95810 Presence of automatic (implantable) cardiac defibrillator: Secondary | ICD-10-CM | POA: Diagnosis not present

## 2017-12-19 DIAGNOSIS — R0989 Other specified symptoms and signs involving the circulatory and respiratory systems: Secondary | ICD-10-CM | POA: Diagnosis not present

## 2017-12-19 DIAGNOSIS — I25810 Atherosclerosis of coronary artery bypass graft(s) without angina pectoris: Secondary | ICD-10-CM | POA: Diagnosis not present

## 2017-12-19 DIAGNOSIS — E1140 Type 2 diabetes mellitus with diabetic neuropathy, unspecified: Secondary | ICD-10-CM | POA: Diagnosis not present

## 2017-12-19 DIAGNOSIS — E7800 Pure hypercholesterolemia, unspecified: Secondary | ICD-10-CM | POA: Diagnosis not present

## 2017-12-19 DIAGNOSIS — G4733 Obstructive sleep apnea (adult) (pediatric): Secondary | ICD-10-CM | POA: Diagnosis not present

## 2017-12-19 DIAGNOSIS — Z951 Presence of aortocoronary bypass graft: Secondary | ICD-10-CM | POA: Diagnosis not present

## 2017-12-19 DIAGNOSIS — I5022 Chronic systolic (congestive) heart failure: Secondary | ICD-10-CM | POA: Diagnosis not present

## 2017-12-19 DIAGNOSIS — Z794 Long term (current) use of insulin: Secondary | ICD-10-CM | POA: Diagnosis not present

## 2017-12-26 ENCOUNTER — Telehealth: Payer: Self-pay | Admitting: Family

## 2017-12-26 ENCOUNTER — Ambulatory Visit: Payer: PPO | Admitting: Family

## 2017-12-26 NOTE — Progress Notes (Deleted)
   Subjective:    Patient ID: [name], male    DOB: 05-26-1946, 72 y.o.   MRN: [id number]  [name] is a 72 y/o male with a history of   Echo report from 12/05/17 reviewed and showed an EF of 20-25% along with mild Daniel and moderate Daniel. Cardiac catheterization done 02/02/16 showed severed 3-vessel CAD with patent grafts. Continue medication management along with dual antiplatelet therapy.   Admitted 12/05/17 due to HF exacerbation. Cardiology consult obtained. Elevated troponins thought to be due to demand ischemia. Discharged the following day. Was in the ED 12/04/17 with chest pain where he was evaluated and released. Was in the ED 11/22/17 due to gout where he was treated and released.   He presents today for his follow-up visit although hasn't been seen in clinic since May 2017. He presents with a chief complaint of     Review of Systems  Constitutional: Positive for fatigue. Negative for appetite change.  HENT: Negative for congestion, postnasal drip and sore throat.   Eyes: Negative.   Respiratory: Positive for shortness of breath. Negative for cough, chest tightness and wheezing.   Cardiovascular: Negative for chest pain, palpitations and leg swelling.  Gastrointestinal: Negative for abdominal distention and abdominal pain.  Endocrine: Negative.   Genitourinary: Negative.   Musculoskeletal: Negative for back pain and neck pain.  Skin: Negative.   Allergic/Immunologic: Negative.   Neurological: Positive for weakness, light-headedness (with position changes) and numbness (& tingling in legs due to neuropathy). Negative for dizziness and headaches.  Hematological: Negative for adenopathy. Bruises/bleeds easily.  Psychiatric/Behavioral: Negative for dysphoric mood and sleep disturbance (wearing CPAP with oxygen @ 5L. sleeping on 2 pillows). The patient is not nervous/anxious.        Objective:   Physical Exam  Constitutional: He is oriented to person, place, and time.  He appears well-developed and well-nourished.  HENT:  Head: Normocephalic and atraumatic.  Eyes: Conjunctivae are normal. Pupils are equal, round, and reactive to light.  Neck: Normal range of motion. Neck supple.  Cardiovascular: Regular rhythm. Bradycardia present.  Pulmonary/Chest: Effort normal. He has no wheezes. He has no rales.  Abdominal: Soft. He exhibits no distension. There is no tenderness.  Musculoskeletal: He exhibits no edema or tenderness.  Neurological: He is alert and oriented to person, place, and time.  Skin: Skin is warm and dry.  Psychiatric: He has a normal mood and affect. His behavior is normal. Thought content normal.  Nursing note and vitals reviewed.         Assessment & Plan:   1: Chronic heart failure with reduced ejection fraction-  - NYHA class - saw cardiology [name]) 09/05/17 - BNP from 12/05/17 was 596.0  2: HTN- - saw PCP ([name]) 12/02/17 - BNP from 12/05/17 reviewed and showed sodium 141, potassium 3.4 and GFR 48    3: Obstructive sleep apnea-    4: Diabetes-    Patient did not bring his medications nor a list.

## 2017-12-26 NOTE — Telephone Encounter (Signed)
Patient did not show for his Heart Failure Clinic appointment on 12/26/17. Will attempt to reschedule.   This is the 6th appointment that he has missed.

## 2017-12-30 ENCOUNTER — Other Ambulatory Visit: Payer: Self-pay

## 2017-12-30 ENCOUNTER — Encounter: Payer: Self-pay | Admitting: *Deleted

## 2017-12-30 ENCOUNTER — Emergency Department: Payer: PPO

## 2017-12-30 ENCOUNTER — Inpatient Hospital Stay
Admission: EM | Admit: 2017-12-30 | Discharge: 2018-01-01 | DRG: 291 | Disposition: A | Discharge status: Home / Self Care | Payer: PPO | Attending: Internal Medicine | Admitting: Internal Medicine

## 2017-12-30 DIAGNOSIS — E785 Hyperlipidemia, unspecified: Secondary | ICD-10-CM | POA: Diagnosis not present

## 2017-12-30 DIAGNOSIS — F329 Major depressive disorder, single episode, unspecified: Secondary | ICD-10-CM | POA: Diagnosis not present

## 2017-12-30 DIAGNOSIS — Z7902 Long term (current) use of antithrombotics/antiplatelets: Secondary | ICD-10-CM

## 2017-12-30 DIAGNOSIS — M109 Gout, unspecified: Secondary | ICD-10-CM | POA: Diagnosis not present

## 2017-12-30 DIAGNOSIS — I959 Hypotension, unspecified: Secondary | ICD-10-CM | POA: Diagnosis present

## 2017-12-30 DIAGNOSIS — Z833 Family history of diabetes mellitus: Secondary | ICD-10-CM | POA: Diagnosis not present

## 2017-12-30 DIAGNOSIS — I2510 Atherosclerotic heart disease of native coronary artery without angina pectoris: Secondary | ICD-10-CM | POA: Diagnosis not present

## 2017-12-30 DIAGNOSIS — I130 Hypertensive heart and chronic kidney disease with heart failure and stage 1 through stage 4 chronic kidney disease, or unspecified chronic kidney disease: Secondary | ICD-10-CM | POA: Diagnosis not present

## 2017-12-30 DIAGNOSIS — Z87891 Personal history of nicotine dependence: Secondary | ICD-10-CM | POA: Diagnosis not present

## 2017-12-30 DIAGNOSIS — Z955 Presence of coronary angioplasty implant and graft: Secondary | ICD-10-CM

## 2017-12-30 DIAGNOSIS — Z9981 Dependence on supplemental oxygen: Secondary | ICD-10-CM

## 2017-12-30 DIAGNOSIS — Z79899 Other long term (current) drug therapy: Secondary | ICD-10-CM | POA: Diagnosis not present

## 2017-12-30 DIAGNOSIS — I5043 Acute on chronic combined systolic (congestive) and diastolic (congestive) heart failure: Secondary | ICD-10-CM | POA: Diagnosis not present

## 2017-12-30 DIAGNOSIS — E876 Hypokalemia: Secondary | ICD-10-CM | POA: Diagnosis not present

## 2017-12-30 DIAGNOSIS — N400 Enlarged prostate without lower urinary tract symptoms: Secondary | ICD-10-CM | POA: Diagnosis not present

## 2017-12-30 DIAGNOSIS — J449 Chronic obstructive pulmonary disease, unspecified: Secondary | ICD-10-CM | POA: Diagnosis present

## 2017-12-30 DIAGNOSIS — Z7951 Long term (current) use of inhaled steroids: Secondary | ICD-10-CM

## 2017-12-30 DIAGNOSIS — J9621 Acute and chronic respiratory failure with hypoxia: Secondary | ICD-10-CM

## 2017-12-30 DIAGNOSIS — I5023 Acute on chronic systolic (congestive) heart failure: Secondary | ICD-10-CM | POA: Diagnosis not present

## 2017-12-30 DIAGNOSIS — R05 Cough: Secondary | ICD-10-CM | POA: Diagnosis not present

## 2017-12-30 DIAGNOSIS — E11649 Type 2 diabetes mellitus with hypoglycemia without coma: Secondary | ICD-10-CM | POA: Diagnosis present

## 2017-12-30 DIAGNOSIS — I509 Heart failure, unspecified: Secondary | ICD-10-CM

## 2017-12-30 DIAGNOSIS — K219 Gastro-esophageal reflux disease without esophagitis: Secondary | ICD-10-CM | POA: Diagnosis not present

## 2017-12-30 DIAGNOSIS — R0602 Shortness of breath: Secondary | ICD-10-CM | POA: Diagnosis not present

## 2017-12-30 DIAGNOSIS — E7800 Pure hypercholesterolemia, unspecified: Secondary | ICD-10-CM | POA: Diagnosis present

## 2017-12-30 DIAGNOSIS — I252 Old myocardial infarction: Secondary | ICD-10-CM

## 2017-12-30 DIAGNOSIS — I10 Essential (primary) hypertension: Secondary | ICD-10-CM | POA: Diagnosis not present

## 2017-12-30 DIAGNOSIS — Z888 Allergy status to other drugs, medicaments and biological substances status: Secondary | ICD-10-CM | POA: Diagnosis not present

## 2017-12-30 DIAGNOSIS — Z8546 Personal history of malignant neoplasm of prostate: Secondary | ICD-10-CM | POA: Diagnosis not present

## 2017-12-30 DIAGNOSIS — E1122 Type 2 diabetes mellitus with diabetic chronic kidney disease: Secondary | ICD-10-CM | POA: Diagnosis not present

## 2017-12-30 DIAGNOSIS — E119 Type 2 diabetes mellitus without complications: Secondary | ICD-10-CM | POA: Diagnosis not present

## 2017-12-30 DIAGNOSIS — Z95810 Presence of automatic (implantable) cardiac defibrillator: Secondary | ICD-10-CM

## 2017-12-30 DIAGNOSIS — Z794 Long term (current) use of insulin: Secondary | ICD-10-CM

## 2017-12-30 DIAGNOSIS — I110 Hypertensive heart disease with heart failure: Secondary | ICD-10-CM | POA: Diagnosis not present

## 2017-12-30 LAB — BASIC METABOLIC PANEL
Anion gap: 12 (ref 5–15)
BUN: 19 mg/dL (ref 6–20)
CHLORIDE: 105 mmol/L (ref 101–111)
CO2: 25 mmol/L (ref 22–32)
CREATININE: 1.47 mg/dL — AB (ref 0.61–1.24)
Calcium: 8.1 mg/dL — ABNORMAL LOW (ref 8.9–10.3)
GFR calc Af Amer: 53 mL/min — ABNORMAL LOW (ref >60)
GFR calc non Af Amer: 46 mL/min — ABNORMAL LOW (ref >60)
GLUCOSE: 51 mg/dL — AB (ref 65–99)
POTASSIUM: 3.2 mmol/L — AB (ref 3.5–5.1)
SODIUM: 142 mmol/L (ref 135–145)

## 2017-12-30 LAB — CBC
HEMATOCRIT: 32.2 % — AB (ref 40.0–52.0)
Hemoglobin: 10.7 g/dL — ABNORMAL LOW (ref 13.0–18.0)
MCH: 30.3 pg (ref 26.0–34.0)
MCHC: 33.1 g/dL (ref 32.0–36.0)
MCV: 91.7 fL (ref 80.0–100.0)
PLATELETS: 219 10*3/uL (ref 150–440)
RBC: 3.52 MIL/uL — ABNORMAL LOW (ref 4.40–5.90)
RDW: 14.4 % (ref 11.5–14.5)
WBC: 7.2 10*3/uL (ref 3.8–10.6)

## 2017-12-30 LAB — MRSA PCR SCREENING: MRSA by PCR: NEGATIVE

## 2017-12-30 LAB — POTASSIUM: POTASSIUM: 3.2 mmol/L — AB (ref 3.5–5.1)

## 2017-12-30 LAB — GLUCOSE, CAPILLARY
Glucose-Capillary: 45 mg/dL — ABNORMAL LOW (ref 65–99)
Glucose-Capillary: 170 mg/dL — ABNORMAL HIGH (ref 65–99)
Glucose-Capillary: 133 mg/dL — ABNORMAL HIGH (ref 65–99)
Glucose-Capillary: 187 mg/dL — ABNORMAL HIGH (ref 65–99)
Glucose-Capillary: 244 mg/dL — ABNORMAL HIGH (ref 65–99)
Glucose-Capillary: 217 mg/dL — ABNORMAL HIGH (ref 65–99)

## 2017-12-30 LAB — BRAIN NATRIURETIC PEPTIDE: B Natriuretic Peptide: 336 pg/mL — ABNORMAL HIGH (ref 0.0–100.0)

## 2017-12-30 LAB — TSH: TSH: 1.507 u[IU]/mL (ref 0.350–4.500)

## 2017-12-30 LAB — MAGNESIUM: MAGNESIUM: 1.3 mg/dL — AB (ref 1.7–2.4)

## 2017-12-30 LAB — TROPONIN I: Troponin I: <0.03 ng/mL (ref <0.03)

## 2017-12-30 MED ORDER — ALLOPURINOL 100 MG PO TABS
100.0000 mg | ORAL_TABLET | Freq: Every day | ORAL | Status: DC
  Administered 2017-12-30 – 2017-12-31 (×2): 100 mg via ORAL
  Filled 2017-12-30 (×3): qty 1

## 2017-12-30 MED ORDER — IPRATROPIUM-ALBUTEROL 0.5-2.5 (3) MG/3ML IN SOLN
3.0000 mL | Freq: Once | RESPIRATORY_TRACT | Status: AC
  Administered 2017-12-30: 3 mL via RESPIRATORY_TRACT

## 2017-12-30 MED ORDER — FLUOXETINE HCL 20 MG PO CAPS
20.0000 mg | ORAL_CAPSULE | Freq: Every day | ORAL | Status: DC
Start: 2017-12-30 — End: 2018-01-01
  Administered 2017-12-30 – 2017-12-31 (×2): 20 mg via ORAL
  Filled 2017-12-30 (×3): qty 1

## 2017-12-30 MED ORDER — RANOLAZINE ER 500 MG PO TB12
500.0000 mg | ORAL_TABLET | Freq: Two times a day (BID) | ORAL | Status: DC
  Administered 2017-12-30 – 2017-12-31 (×4): 500 mg via ORAL
  Filled 2017-12-30 (×8): qty 1

## 2017-12-30 MED ORDER — INSULIN GLARGINE 100 UNIT/ML ~~LOC~~ SOLN
60.0000 [IU] | Freq: Every day | SUBCUTANEOUS | Status: DC
  Administered 2017-12-30 – 2017-12-31 (×2): 60 [IU] via SUBCUTANEOUS
  Filled 2017-12-30 (×4): qty 0.6

## 2017-12-30 MED ORDER — MAGNESIUM SULFATE 4 GM/100ML IV SOLN
4.0000 g | Freq: Once | INTRAVENOUS | Status: AC
  Administered 2017-12-30: 4 g via INTRAVENOUS
  Filled 2017-12-30: qty 100

## 2017-12-30 MED ORDER — ISOSORBIDE MONONITRATE ER 30 MG PO TB24
60.0000 mg | ORAL_TABLET | Freq: Every day | ORAL | Status: DC
  Administered 2017-12-30 – 2017-12-31 (×2): 60 mg via ORAL
  Filled 2017-12-30 (×2): qty 2

## 2017-12-30 MED ORDER — CARVEDILOL 3.125 MG PO TABS
3.1250 mg | ORAL_TABLET | Freq: Two times a day (BID) | ORAL | Status: DC
  Administered 2017-12-30: 3.125 mg via ORAL

## 2017-12-30 MED ORDER — ONDANSETRON HCL 4 MG PO TABS: 4.0000 mg | ORAL_TABLET | Freq: Four times a day (QID) | ORAL | Status: DC | PRN

## 2017-12-30 MED ORDER — CARVEDILOL 6.25 MG PO TABS
6.2500 mg | ORAL_TABLET | Freq: Two times a day (BID) | ORAL | Status: DC
  Administered 2017-12-30: 6.25 mg via ORAL
  Filled 2017-12-30 (×2): qty 1

## 2017-12-30 MED ORDER — ONDANSETRON HCL 4 MG/2ML IJ SOLN: 4.0000 mg | Freq: Four times a day (QID) | INTRAMUSCULAR | Status: DC | PRN

## 2017-12-30 MED ORDER — NITROGLYCERIN 0.4 MG SL SUBL: 0.4000 mg | SUBLINGUAL_TABLET | SUBLINGUAL | Status: DC | PRN

## 2017-12-30 MED ORDER — CARVEDILOL 3.125 MG PO TABS: 3.1250 mg | ORAL_TABLET | Freq: Two times a day (BID) | ORAL | Status: DC

## 2017-12-30 MED ORDER — DOCUSATE SODIUM 100 MG PO CAPS
100.0000 mg | ORAL_CAPSULE | Freq: Two times a day (BID) | ORAL | Status: DC
  Administered 2017-12-30 – 2018-01-01 (×4): 100 mg via ORAL
  Filled 2017-12-30 (×5): qty 1

## 2017-12-30 MED ORDER — LORATADINE 10 MG PO TABS
10.0000 mg | ORAL_TABLET | Freq: Every day | ORAL | Status: DC
  Administered 2017-12-30 – 2018-01-01 (×3): 10 mg via ORAL
  Filled 2017-12-30 (×3): qty 1

## 2017-12-30 MED ORDER — DEXTROSE 50 % IV SOLN
1.0000 | Freq: Once | INTRAVENOUS | Status: AC
  Administered 2017-12-30: 50 mL via INTRAVENOUS

## 2017-12-30 MED ORDER — SPIRONOLACTONE 25 MG PO TABS
12.5000 mg | ORAL_TABLET | Freq: Every day | ORAL | Status: DC
  Administered 2017-12-30 – 2017-12-31 (×2): 12.5 mg via ORAL
  Filled 2017-12-30: qty 1
  Filled 2017-12-30: qty 0.5
  Filled 2017-12-30: qty 1
  Filled 2017-12-30 (×2): qty 0.5

## 2017-12-30 MED ORDER — POTASSIUM CHLORIDE CRYS ER 20 MEQ PO TBCR
30.0000 meq | EXTENDED_RELEASE_TABLET | ORAL | Status: AC
  Administered 2017-12-30 (×2): 30 meq via ORAL
  Filled 2017-12-30 (×2): qty 2

## 2017-12-30 MED ORDER — INSULIN ASPART 100 UNIT/ML ~~LOC~~ SOLN
0.0000 [IU] | Freq: Three times a day (TID) | SUBCUTANEOUS | Status: DC
  Administered 2017-12-30: 3 [IU] via SUBCUTANEOUS
  Administered 2017-12-30: 4 [IU] via SUBCUTANEOUS
  Administered 2017-12-30: 7 [IU] via SUBCUTANEOUS
  Administered 2017-12-31: 3 [IU] via SUBCUTANEOUS
  Administered 2017-12-31: 7 [IU] via SUBCUTANEOUS
  Filled 2017-12-30 (×5): qty 1

## 2017-12-30 MED ORDER — GABAPENTIN 300 MG PO CAPS
300.0000 mg | ORAL_CAPSULE | Freq: Two times a day (BID) | ORAL | Status: DC
  Administered 2017-12-30 – 2018-01-01 (×5): 300 mg via ORAL
  Filled 2017-12-30 (×5): qty 1

## 2017-12-30 MED ORDER — PANTOPRAZOLE SODIUM 40 MG PO TBEC
80.0000 mg | DELAYED_RELEASE_TABLET | Freq: Two times a day (BID) | ORAL | Status: DC
  Administered 2017-12-30 – 2018-01-01 (×5): 80 mg via ORAL
  Filled 2017-12-30 (×5): qty 2

## 2017-12-30 MED ORDER — CARVEDILOL 3.125 MG PO TABS
3.1250 mg | ORAL_TABLET | Freq: Two times a day (BID) | ORAL | Status: DC
Start: 2017-12-30 — End: 2018-01-01
  Administered 2017-12-31: 3.125 mg via ORAL
  Filled 2017-12-30 (×3): qty 1

## 2017-12-30 MED ORDER — TORSEMIDE 20 MG PO TABS
40.0000 mg | ORAL_TABLET | Freq: Every day | ORAL | Status: DC
Start: 2017-12-30 — End: 2018-01-01
  Administered 2017-12-30 – 2017-12-31 (×2): 40 mg via ORAL
  Filled 2017-12-30 (×2): qty 2

## 2017-12-30 MED ORDER — IPRATROPIUM-ALBUTEROL 0.5-2.5 (3) MG/3ML IN SOLN
3.0000 mL | Freq: Four times a day (QID) | RESPIRATORY_TRACT | Status: DC
  Administered 2017-12-30 – 2017-12-31 (×4): 3 mL via RESPIRATORY_TRACT
  Filled 2017-12-30 (×4): qty 3

## 2017-12-30 MED ORDER — CITALOPRAM HYDROBROMIDE 20 MG PO TABS
20.0000 mg | ORAL_TABLET | Freq: Two times a day (BID) | ORAL | Status: DC
  Administered 2017-12-30: 20 mg via ORAL
  Filled 2017-12-30: qty 1

## 2017-12-30 MED ORDER — METHYLPREDNISOLONE SODIUM SUCC 125 MG IJ SOLR
INTRAMUSCULAR | Status: AC
  Administered 2017-12-30: 125 mg via INTRAVENOUS
  Filled 2017-12-30: qty 2

## 2017-12-30 MED ORDER — COENZYME Q10 30 MG PO CAPS: 30.0000 mg | ORAL_CAPSULE | Freq: Every day | ORAL | Status: DC

## 2017-12-30 MED ORDER — IPRATROPIUM-ALBUTEROL 0.5-2.5 (3) MG/3ML IN SOLN
RESPIRATORY_TRACT | Status: AC
  Administered 2017-12-30: 3 mL via RESPIRATORY_TRACT
  Filled 2017-12-30: qty 3

## 2017-12-30 MED ORDER — ATORVASTATIN CALCIUM 20 MG PO TABS
80.0000 mg | ORAL_TABLET | Freq: Every evening | ORAL | Status: DC
  Administered 2017-12-30 – 2017-12-31 (×2): 80 mg via ORAL
  Filled 2017-12-30 (×2): qty 4

## 2017-12-30 MED ORDER — FUROSEMIDE 10 MG/ML IJ SOLN
40.0000 mg | Freq: Once | INTRAMUSCULAR | Status: AC
  Administered 2017-12-30: 40 mg via INTRAVENOUS
  Filled 2017-12-30: qty 4

## 2017-12-30 MED ORDER — IPRATROPIUM-ALBUTEROL 0.5-2.5 (3) MG/3ML IN SOLN
3.0000 mL | RESPIRATORY_TRACT | Status: DC
  Administered 2017-12-30 (×2): 3 mL via RESPIRATORY_TRACT
  Filled 2017-12-30 (×2): qty 3

## 2017-12-30 MED ORDER — SACUBITRIL-VALSARTAN 24-26 MG PO TABS
1.0000 | ORAL_TABLET | Freq: Two times a day (BID) | ORAL | Status: DC
Start: 2017-12-30 — End: 2018-01-01
  Administered 2017-12-30 – 2017-12-31 (×3): 1 via ORAL
  Filled 2017-12-30 (×6): qty 1

## 2017-12-30 MED ORDER — SODIUM CHLORIDE 0.9 % IV SOLN: 4.0000 g | Freq: Once | INTRAVENOUS | Status: DC

## 2017-12-30 MED ORDER — CLOPIDOGREL BISULFATE 75 MG PO TABS
75.0000 mg | ORAL_TABLET | ORAL | Status: DC
  Administered 2017-12-30 – 2018-01-01 (×3): 75 mg via ORAL
  Filled 2017-12-30 (×4): qty 1

## 2017-12-30 MED ORDER — ACETAMINOPHEN 325 MG PO TABS: 650.0000 mg | ORAL_TABLET | Freq: Four times a day (QID) | ORAL | Status: DC | PRN

## 2017-12-30 MED ORDER — CALCIUM CARBONATE ANTACID 500 MG PO CHEW: 1.0000 | CHEWABLE_TABLET | ORAL | Status: DC | PRN

## 2017-12-30 MED ORDER — HEPARIN SODIUM (PORCINE) 5000 UNIT/ML IJ SOLN
5000.0000 [IU] | Freq: Three times a day (TID) | INTRAMUSCULAR | Status: DC
  Administered 2017-12-30 – 2018-01-01 (×7): 5000 [IU] via SUBCUTANEOUS
  Filled 2017-12-30 (×7): qty 1

## 2017-12-30 MED ORDER — MOMETASONE FURO-FORMOTEROL FUM 100-5 MCG/ACT IN AERO
2.0000 | INHALATION_SPRAY | Freq: Two times a day (BID) | RESPIRATORY_TRACT | Status: DC
  Administered 2017-12-30 – 2018-01-01 (×5): 2 via RESPIRATORY_TRACT
  Filled 2017-12-30 (×2): qty 8.8

## 2017-12-30 MED ORDER — METHYLPREDNISOLONE SODIUM SUCC 125 MG IJ SOLR
125.0000 mg | Freq: Once | INTRAMUSCULAR | Status: AC
  Administered 2017-12-30: 125 mg via INTRAVENOUS

## 2017-12-30 MED ORDER — DEXTROSE 50 % IV SOLN
INTRAVENOUS | Status: AC
  Filled 2017-12-30: qty 50

## 2017-12-30 MED ORDER — FERROUS SULFATE 325 (65 FE) MG PO TABS
325.0000 mg | ORAL_TABLET | Freq: Two times a day (BID) | ORAL | Status: DC
  Administered 2017-12-30 – 2018-01-01 (×5): 325 mg via ORAL
  Filled 2017-12-30 (×5): qty 1

## 2017-12-30 MED ORDER — FINASTERIDE 5 MG PO TABS
5.0000 mg | ORAL_TABLET | Freq: Every day | ORAL | Status: DC
  Administered 2017-12-30 – 2017-12-31 (×2): 5 mg via ORAL
  Filled 2017-12-30 (×2): qty 1

## 2017-12-30 MED ORDER — ACETAMINOPHEN 650 MG RE SUPP: 650.0000 mg | Freq: Four times a day (QID) | RECTAL | Status: DC | PRN

## 2017-12-30 NOTE — Progress Notes (Signed)
PHARMACIST - PHYSICIAN ORDER COMMUNICATION  CONCERNING: P&T Medication Policy on Herbal Medications  DESCRIPTION:  This patient's order for:  Coenzyme Q-10  has been noted.  This product(s) is classified as an "herbal" or natural product. Due to a lack of definitive safety studies or FDA approval, nonstandard manufacturing practices, plus the potential risk of unknown drug-drug interactions while on inpatient medications, the Pharmacy and Therapeutics Committee does not permit the use of "herbal" or natural products of this type within [facility].   ACTION TAKEN: The pharmacy department is unable to verify this order at this time. Please reevaluate patient's clinical condition at discharge and address if the herbal or natural product(s) should be resumed at that time.   

## 2017-12-30 NOTE — Progress Notes (Signed)
This is a 72 year old male admitted for CHF exacerbation. 1.  CHF: Systolic; acute on chronic.  The patient is received Lasix 40 mg IV for pulmonary edema.  He is currently on BiPAP to improve venous return and work of breathing.  Continue IV diuretics until patient is back to baseline.  Hold oral torsemide for now.  Continue Entresto and spironolactone. 2.  Hypertension: Uncontrolled; nitroglycerin paste applied.  Adjust Coreg as needed.  Consider hydralazine as needed. 3.  CAD: Stable; no indication of myocardial ischemia at this time.  Continue aspirin and Plavix, Ranexa and Imdur. 4.  Diabetes mellitus type 2: Continue basal insulin.  Sliding-scale insulin while hospitalized.  Patient discovered to have low blood sugar prior to admission.  Given an amp of D50. 5.   Hyperlipidemia: Continue statin therapy 6.  COPD: Continue inhaled corticosteroid as well as duonebs.   7.  BPH: Continue Proscar 8.  Gout: Stable; continue allopurinol 9.  Depression: Continue Prozac and Celexa 10.  DVT prophylaxis: Heparin 11.  GI prophylaxis: Pantoprazole per home regimen The patient is a full code.    Agree with above plan

## 2017-12-30 NOTE — ED Provider Notes (Signed)
[facility] Emergency Department Provider Note    First MD Initiated Contact with Patient 12/30/17 0050     (approximate)  I have reviewed the triage vital signs and the nursing notes.   HISTORY  Chief Complaint Shortness of Breath    HPI [name] is a 72 y.o. male with blows of chronic medical conditions including ischemic cardiomyopathy CHF and COPD presents to the emergency department with acute onset of dyspnea without productive cough that began at 11:30 PM tonight. Patient denies any fever. Patient denies any chest pain. Patient denies any lower extremity pain or swelling. Per EMS on their arrival patient's oxygen saturation 85% on room air. Patient was given a DuoNeb in route to the emergency department with improvement in oxygen saturation is currently 91%. Patient admits to continued respiratory difficulty.   Past Medical History:  Diagnosis Date  . Anemia   . Cancer ([place]) 12/2013   prostate  . Cardiogenic pulmonary edema ([place]) 12/19/2014  . Cardiomyopathy, ischemic   . CHF (congestive heart failure) ([place])   . COPD (chronic obstructive pulmonary disease) ([place])   . Diabetes mellitus without complication ([place])   . GERD (gastroesophageal reflux disease)   . Hypercholesteremia   . Hypertension   . Myocardial infarction ([place]) [id number]  . Shortness of breath dyspnea     Patient Active Problem List   Diagnosis Date Noted  . Acute on chronic systolic heart failure ([place]) 07/17/2017  . Flash pulmonary edema ([place]) 07/01/2017  . Acute on chronic systolic CHF (congestive heart failure) ([place]) 04/21/2017  . Acute on chronic respiratory failure with hypoxia ([place]) 04/21/2017  . Hypokalemia 04/21/2017  . Hypomagnesemia 04/21/2017  . Essential hypertension 04/21/2017  . Hyperlipidemia 04/21/2017  . Coronary artery disease 04/21/2017  . CHF (congestive heart failure) ([place]) 04/20/2017  . HTN (hypertension) 02/13/2016  . COPD with chronic  bronchitis ([place]) 02/13/2016  . Obstructive sleep apnea 02/13/2016  . Diabetes ([place]) 02/13/2016  . Chronic systolic HF (heart failure) ([place]) 12/12/2015    Past Surgical History:  Procedure Laterality Date  . CARDIAC CATHETERIZATION N/A 02/02/2016   Procedure: Left Heart Cath and Coronary Angiography;  Surgeon: [name], MD;  Location: [place];  Service: Cardiovascular;  Laterality: N/A;  . CORONARY ANGIOPLASTY WITH STENT PLACEMENT    . CORONARY ARTERY BYPASS GRAFT  11/24/2010  . IMPLANTABLE CARDIOVERTER DEFIBRILLATOR (ICD) GENERATOR CHANGE Left 12/12/2015   Procedure: DUAL LEAD PLACEMENT CARDIAC DIFIBRILLATOR;  Surgeon: [name], MD;  Location: [facility] ORS;  Service: Cardiovascular;  Laterality: Left;  . TONSILLECTOMY      Prior to Admission medications   Medication Sig Start Date End Date Taking? Authorizing Provider  albuterol (PROVENTIL HFA;VENTOLIN HFA) 108 (90 Base) MCG/ACT inhaler Inhale 2 puffs into the lungs 4 (four) times daily as needed for wheezing or shortness of breath.    Yes [provider]  albuterol-ipratropium (COMBIVENT) 18-103 MCG/ACT inhaler Inhale 1 puff into the lungs 2 times daily at 12 noon and 4 pm.   Yes [provider]  allopurinol (ZYLOPRIM) 100 MG tablet Take 100 mg by mouth daily.   Yes [provider]  atorvastatin (LIPITOR) 80 MG tablet Take 1 tablet (80 mg total) by mouth every evening. 04/21/17  Yes Theodoro [name], MD  budesonide-formoterol (SYMBICORT) 80-4.5 MCG/ACT inhaler Inhale 2 puffs into the lungs 2 (two) times daily.   Yes [provider]  calcium carbonate (TUMS - DOSED IN MG ELEMENTAL CALCIUM) 500 MG chewable tablet  Chew 1 tablet by mouth as needed for indigestion or heartburn.   Yes [provider]  carvedilol (COREG) 6.25 MG tablet Take 6.25 mg by mouth 2 (two) times daily with a meal.   Yes [provider]  clopidogrel (PLAVIX) 75 MG tablet Take 75 mg by mouth every  morning.   Yes [provider]  co-enzyme Q-10 30 MG capsule Take 30 mg by mouth daily.   Yes [provider]  ferrous sulfate 325 (65 FE) MG tablet Take 325 mg by mouth 2 (two) times daily with a meal.   Yes [provider]  finasteride (PROSCAR) 5 MG tablet Take 5 mg by mouth at bedtime.   Yes [provider]  FLUoxetine (PROZAC) 20 MG capsule Take 20 mg by mouth daily.   Yes [provider]  gabapentin (NEURONTIN) 300 MG capsule Take 300 mg by mouth 2 (two) times daily.    Yes [provider]  insulin glargine (LANTUS) 100 UNIT/ML injection Inject 100 Units into the skin at bedtime.    Yes [provider]  insulin lispro (HUMALOG) 100 UNIT/ML KiwkPen Inject 0.18 Units into the skin 3 (three) times daily with meals. Per sliding scale   Yes [provider]  isosorbide mononitrate (IMDUR) 60 MG 24 hr tablet Take 60 mg by mouth daily.   Yes [provider]  loratadine (CLARITIN) 10 MG tablet Take 10 mg by mouth daily.   Yes [provider]  Multiple Vitamins-Minerals (CENTRUM SILVER PO) Take 1 tablet by mouth every morning.   Yes [provider]  nitroGLYCERIN (NITROSTAT) 0.4 MG SL tablet Place 0.4 mg under the tongue every 5 (five) minutes as needed for chest pain.   Yes [provider]  pantoprazole (PROTONIX) 40 MG tablet Take 80 mg by mouth 2 (two) times daily.    Yes [provider]  ranolazine (RANEXA) 500 MG 12 hr tablet Take 500 mg by mouth 2 (two) times daily.   Yes [provider]  sacubitril-valsartan (ENTRESTO) 24-26 MG Take 1 tablet by mouth 2 (two) times daily. 12/07/17  Yes Max Sane, MD  spironolactone (ALDACTONE) 25 MG tablet Take 12.5 mg by mouth daily.   Yes [provider]  torsemide (DEMADEX) 20 MG tablet Take 40 mg by mouth daily.   Yes [provider]  citalopram (CELEXA) 20 MG tablet Take 1 tablet by mouth 2 (two) times daily.     [provider]    Allergies Benadryl [diphenhydramine]; Doxycycline; and Lopid [gemfibrozil]  Family History  Problem Relation Age of Onset  . CAD Mother   . Cancer Mother   . Diabetes Mother   . Alzheimer's disease Father   . Cancer Father   . Heart disease Father     Social History Social History   Tobacco Use  . Smoking status: Former Smoker    Packs/day: 2.00    Years: 30.00    Pack years: 60.00    Last attempt to quit: 12/03/1996    Years since quitting: 21.0  . Smokeless tobacco: Never Used  Substance Use Topics  . Alcohol use: No    Alcohol/week: 0.0 oz  . Drug use: No    Review of Systems Constitutional: No fever/chills Eyes: No visual changes. ENT: No sore throat. Cardiovascular: Denies chest pain. Respiratory: Positive for shortness of breath. And cough Gastrointestinal: No abdominal pain.  No nausea, no vomiting.  No diarrhea.  No constipation. Genitourinary: Negative for dysuria. Musculoskeletal: Negative for neck  pain.  Negative for back pain. Integumentary: Negative for rash. Neurological: Negative for headaches, focal weakness or numbness.   ____________________________________________   PHYSICAL EXAM:  VITAL SIGNS: ED Triage Vitals  Enc Vitals Group     BP 12/30/17 0047 118/69     Pulse Rate 12/30/17 0047 81     Resp 12/30/17 0047 (!) 25     Temp 12/30/17 0047 99.6 F (37.6 C)     Temp Source 12/30/17 0047 Oral     SpO2 12/30/17 0047 94 %     Weight 12/30/17 0047 111.1 kg (245 lb)     Height 12/30/17 0047 1.727 m (5\' 8" )     Head Circumference --      Peak Flow --      Pain Score 12/30/17 0045 0     Pain Loc --      Pain Edu? --      Excl. in [place]? --     Constitutional: Alert and oriented. Apparent respiratory difficulty Eyes: Conjunctivae are normal. Head: Atraumatic. Mouth/Throat: Mucous membranes are moist.  Oropharynx non-erythematous. Neck: No stridor.   Cardiovascular: Normal rate, regular rhythm. Good  peripheral circulation. Grossly normal heart sounds. Respiratory: Tachypnea, positive accessory respiratory muscle use, bibasilar rales Gastrointestinal: Soft and nontender. No distention.  Musculoskeletal: No lower extremity tenderness nor edema. No gross deformities of extremities. Neurologic:  Normal speech and language. No gross focal neurologic deficits are appreciated.  Skin:  Skin is warm, dry and intact. No rash noted. Psychiatric: Mood and affect are normal. Speech and behavior are normal.  ____________________________________________   LABS (all labs ordered are listed, but only abnormal results are displayed)  Labs Reviewed  BASIC METABOLIC PANEL - Abnormal; Notable for the following components:      Result Value   Potassium 3.2 (*)    Glucose, Bld 51 (*)    Creatinine, Ser 1.47 (*)    Calcium 8.1 (*)    GFR calc non Af Amer 46 (*)    GFR calc Af Amer 53 (*)    All other components within normal limits  CBC - Abnormal; Notable for the following components:   RBC 3.52 (*)    Hemoglobin 10.7 (*)    HCT 32.2 (*)    All other components within normal limits  BRAIN NATRIURETIC PEPTIDE - Abnormal; Notable for the following components:   B Natriuretic Peptide 336.0 (*)    All other components within normal limits  MRSA PCR SCREENING  TROPONIN I  TSH   ____________________________________________  EKG  ED ECG REPORT I, [place] N Lindel Marcell, the attending physician, personally viewed and interpreted this ECG.   Date: 12/30/2017  EKG Time: 12:44 AM  Rate: 85  Rhythm: atrial sensed ventricular paced rhythm  Axis: Normal  Intervals: Normal  ST&T Change: None  ____________________________________________  RADIOLOGY I, [place] Ernst Bowler, personally viewed and evaluated these images (plain radiographs) as part of my medical decision making, as well as reviewing the written report by the radiologist.  Dg Chest 2 View  Result Date: 12/30/2017 CLINICAL DATA:  Shortness of  breath. History of gastroesophageal reflux disease, congestive heart failure, emphysema, hypertension, heart disease, former smoker. EXAM: CHEST  2 VIEW COMPARISON:  12/05/2017 FINDINGS: Postoperative changes in the mediastinum. Cardiac pacemaker. Mild cardiac enlargement with mild pulmonary vascular congestion similar to previous study. Mild interstitial pattern to the lung bases likely represents early interstitial edema. No focal consolidation. No blunting of costophrenic angles. No pneumothorax. Mediastinal contours appear intact. Calcification of the  aorta. Degenerative changes in the spine. Coronary artery calcifications. IMPRESSION: Cardiac enlargement with mild vascular congestion and early interstitial edema. No focal consolidation. Aortic atherosclerosis. Electronically Signed   By: Lucienne Capers M.D.   On: 12/30/2017 01:27    ____________________________________________   PROCEDURES  Critical Care performed: CRITICAL CARE Performed by: Gregor Hams   Total critical care time: 30 minutes  Critical care time was exclusive of separately billable procedures and treating other patients.  Critical care was necessary to treat or prevent imminent or life-threatening deterioration.  Critical care was time spent personally by me on the following activities: development of treatment plan with patient and/or surrogate as well as nursing, discussions with consultants, evaluation of patient's response to treatment, examination of patient, obtaining history from patient or surrogate, ordering and performing treatments and interventions, ordering and review of laboratory studies, ordering and review of radiographic studies, pulse oximetry and re-evaluation of patient's condition.  Procedures   ____________________________________________   INITIAL IMPRESSION / ASSESSMENT AND PLAN / ED COURSE  As part of my medical decision making, I reviewed the following data within the electronic  MEDICAL RECORD NUMBER 72 year old male presenting with above stated history of physical exam concerning for possible COPD exacerbation versus CHF exacerbation which is most likely. Given respiratory difficulty patient received an additional DuoNeb in the emergency department as well as IV Solu-Medrol while awaiting chest x-ray to be performed. Chest x-ray revealed evidence of cardiomegaly with interstitial edema. Patient given 40 mg of IV Lasix.   Was notified by the nursing staff after above stated intervention and the patient's respiratory status while initially it improved and now worsened the patient becoming diaphoretic with return of tachypnea and return of accessory respiratory muscle use. A such BiPAP applied. On reevaluation patient's respiratory status markedly improved no longer tachypnea nor use accessory respiratory muscle use. Patient discussed with Dr. Marcille Blanco for hospital admission for further evaluation and management of acute on chronic CHF exacerbation _________________  FINAL CLINICAL IMPRESSION(S) / ED DIAGNOSES  Final diagnoses:  Acute on chronic systolic heart failure ([place])     MEDICATIONS GIVEN DURING THIS VISIT:  Medications  heparin injection 5,000 Units (not administered)  acetaminophen (TYLENOL) tablet 650 mg (not administered)    Or  acetaminophen (TYLENOL) suppository 650 mg (not administered)  docusate sodium (COLACE) capsule 100 mg (not administered)  ondansetron (ZOFRAN) tablet 4 mg (not administered)    Or  ondansetron (ZOFRAN) injection 4 mg (not administered)  insulin glargine (LANTUS) injection 60 Units (not administered)  insulin aspart (novoLOG) injection 0-20 Units (not administered)  mometasone-formoterol (DULERA) 100-5 MCG/ACT inhaler 2 puff (not administered)  clopidogrel (PLAVIX) tablet 75 mg (not administered)  ferrous sulfate tablet 325 mg (not administered)  finasteride (PROSCAR) tablet 5 mg (not administered)  gabapentin (NEURONTIN) capsule 300  mg (not administered)  loratadine (CLARITIN) tablet 10 mg (not administered)  nitroGLYCERIN (NITROSTAT) SL tablet 0.4 mg (not administered)  ranolazine (RANEXA) 12 hr tablet 500 mg (not administered)  carvedilol (COREG) tablet 6.25 mg (not administered)  isosorbide mononitrate (IMDUR) 24 hr tablet 60 mg (not administered)  spironolactone (ALDACTONE) tablet 12.5 mg (not administered)  FLUoxetine (PROZAC) capsule 20 mg (not administered)  calcium carbonate (TUMS - dosed in mg elemental calcium) chewable tablet 200 mg of elemental calcium (not administered)  atorvastatin (LIPITOR) tablet 80 mg (not administered)  pantoprazole (PROTONIX) EC tablet 80 mg (not administered)  citalopram (CELEXA) tablet 20 mg (not administered)  torsemide (DEMADEX) tablet 40 mg (not administered)  allopurinol (ZYLOPRIM)  tablet 100 mg (not administered)  sacubitril-valsartan (ENTRESTO) 24-26 mg per tablet (not administered)  ipratropium-albuterol (DUONEB) 0.5-2.5 (3) MG/3ML nebulizer solution 3 mL (not administered)  ipratropium-albuterol (DUONEB) 0.5-2.5 (3) MG/3ML nebulizer solution 3 mL (3 mLs Nebulization Given 12/30/17 0113)  methylPREDNISolone sodium succinate (SOLU-MEDROL) 125 mg/2 mL injection 125 mg (125 mg Intravenous Given 12/30/17 0113)  furosemide (LASIX) injection 40 mg (40 mg Intravenous Given 12/30/17 0131)     ED Discharge Orders    None       Note:  This document was prepared using Dragon voice recognition software and may include unintentional dictation errors.    Gregor Hams, MD 12/30/17 [phone]

## 2017-12-30 NOTE — Progress Notes (Addendum)
Pt Bp was at 106/43 Hr 98. Doctor Salary was notified and ordered to change order  To discontinue 6.25mg   and change to 3.125 mg. Will continue to monitor.

## 2017-12-30 NOTE — H&P (Signed)
[name] is an 72 y.o. male.   Chief Complaint: Shortness of breath HPI: The patient with past medical history of CHF, CAD status post myocardial infarction, COPD, diabetes, hypertension and history of prostate cancer presents to the emergency department complaining of acute onset shortness of breath.  The patient states that he had been feeling well all day and had laid down in the bed to go to sleep when he became short of breath.  He denied chest pain, nausea or diaphoresis.  Chest x-ray in the emergency department showed pulmonary edema.  The patient was given Lasix 40 mg IV and placed on BiPAP.  Nitropaste was also applied to his chest due to elevated blood pressure prior to the emergency department staff calling the hospitalist service for admission.  Past Medical History:  Diagnosis Date  . Anemia   . Cancer ([place]) 12/2013   prostate  . 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 Cardiomyopathy, ischemic   . CHF (congestive heart failure) ([place])   . COPD (chronic obstructive pulmonary disease) ([place])   . Diabetes mellitus without complication ([place])   . GERD (gastroesophageal reflux disease)   . Hypercholesteremia   . Hypertension   . Myocardial infarction ([place]) [id number]  . Shortness of breath dyspnea     Past Surgical History:  Procedure Laterality Date  . CARDIAC CATHETERIZATION N/A 02/02/2016   Procedure: Left Heart Cath and Coronary Angiography;  Surgeon: [name], MD;  Location: [facility];  Service: Cardiovascular;  Laterality: N/A;  . CORONARY ANGIOPLASTY WITH STENT PLACEMENT    . CORONARY ARTERY BYPASS GRAFT  11/24/2010  . IMPLANTABLE CARDIOVERTER DEFIBRILLATOR (ICD) GENERATOR CHANGE Left 12/12/2015   Procedure: DUAL LEAD PLACEMENT CARDIAC DIFIBRILLATOR;  Surgeon: [name], MD;  Location: [facility];  Service: Cardiovascular;  Laterality: Left;  . TONSILLECTOMY      Family History  Problem Relation Age of Onset  . CAD Mother   . Cancer  Mother   . Diabetes Mother   . Alzheimer's disease Father   . Cancer Father   . Heart disease Father    Social History:  reports that he quit smoking about 21 years ago. He has a 60.00 pack-year smoking history. he has never used smokeless tobacco. He reports that he does not drink alcohol or use drugs.  Allergies:  Allergies  Allergen Reactions  . Benadryl [Diphenhydramine] Other (See Comments)    " Hyperactivity"  . Doxycycline Swelling    Pt went into pulmonary edema.  . Lopid [Gemfibrozil] Swelling    "I gain 1 pound a day for 30 days."    Medications Prior to Admission  Medication Sig Dispense Refill  . albuterol (PROVENTIL HFA;VENTOLIN HFA) 108 (90 Base) MCG/ACT inhaler Inhale 2 puffs into the lungs 4 (four) times daily as needed for wheezing or shortness of breath.     Marland Kitchen albuterol-ipratropium (COMBIVENT) 18-103 MCG/ACT inhaler Inhale 1 puff into the lungs 2 times daily at 12 noon and 4 pm.    . allopurinol (ZYLOPRIM) 100 MG tablet Take 100 mg by mouth daily.    Marland Kitchen atorvastatin (LIPITOR) 80 MG tablet Take 1 tablet (80 mg total) by mouth every evening. 30 tablet 3  . budesonide-formoterol (SYMBICORT) 80-4.5 MCG/ACT inhaler Inhale 2 puffs into the lungs 2 (two) times daily.    . calcium carbonate (TUMS - DOSED IN MG ELEMENTAL CALCIUM) 500 MG chewable tablet Chew 1 tablet by mouth as needed for indigestion or heartburn.    . carvedilol (  COREG) 6.25 MG tablet Take 6.25 mg by mouth 2 (two) times daily with a meal.    . clopidogrel (PLAVIX) 75 MG tablet Take 75 mg by mouth every morning.    Marland Kitchen co-enzyme Q-10 30 MG capsule Take 30 mg by mouth daily.    . ferrous sulfate 325 (65 FE) MG tablet Take 325 mg by mouth 2 (two) times daily with a meal.    . finasteride (PROSCAR) 5 MG tablet Take 5 mg by mouth at bedtime.    Marland Kitchen FLUoxetine (PROZAC) 20 MG capsule Take 20 mg by mouth daily.    Marland Kitchen gabapentin (NEURONTIN) 300 MG capsule Take 300 mg by mouth 2 (two) times daily.     . insulin glargine  (LANTUS) 100 UNIT/ML injection Inject 100 Units into the skin at bedtime.     . insulin lispro (HUMALOG) 100 UNIT/ML KiwkPen Inject 0.18 Units into the skin 3 (three) times daily with meals. Per sliding scale    . isosorbide mononitrate (IMDUR) 60 MG 24 hr tablet Take 60 mg by mouth daily.    Marland Kitchen loratadine (CLARITIN) 10 MG tablet Take 10 mg by mouth daily.    . Multiple Vitamins-Minerals (CENTRUM SILVER PO) Take 1 tablet by mouth every morning.    . nitroGLYCERIN (NITROSTAT) 0.4 MG SL tablet Place 0.4 mg under the tongue every 5 (five) minutes as needed for chest pain.    . pantoprazole (PROTONIX) 40 MG tablet Take 80 mg by mouth 2 (two) times daily.     . ranolazine (RANEXA) 500 MG 12 hr tablet Take 500 mg by mouth 2 (two) times daily.    . sacubitril-valsartan (ENTRESTO) 24-26 MG Take 1 tablet by mouth 2 (two) times daily. 60 tablet 0  . spironolactone (ALDACTONE) 25 MG tablet Take 12.5 mg by mouth daily.    Marland Kitchen torsemide (DEMADEX) 20 MG tablet Take 40 mg by mouth daily.    . citalopram (CELEXA) 20 MG tablet Take 1 tablet by mouth 2 (two) times daily.      Results for orders placed or performed during the hospital encounter of 12/30/17 (from the past 48 hour(s))  Basic metabolic panel     Status: Abnormal   Collection Time: 12/30/17 12:48 AM  Result Value Ref Range   Sodium 142 135 - 145 mmol/L   Potassium 3.2 (L) 3.5 - 5.1 mmol/L   Chloride 105 101 - 111 mmol/L   CO2 25 22 - 32 mmol/L   Glucose, Bld 51 (L) 65 - 99 mg/dL   BUN 19 6 - 20 mg/dL   Creatinine, Ser 1.47 (H) 0.61 - 1.24 mg/dL   Calcium 8.1 (L) 8.9 - 10.3 mg/dL   GFR calc non Af Amer 46 (L) >60 mL/min   GFR calc Af Amer 53 (L) >60 mL/min    Comment: (NOTE) The eGFR has been calculated using the CKD EPI equation. This calculation has not been validated in all clinical situations. eGFR's persistently <60 mL/min signify possible Chronic Kidney Disease.    Anion gap 12 5 - 15    Comment: Performed at [facility],  [address] [place],  [address]  CBC     Status: Abnormal   Collection Time: 12/30/17 12:48 AM  Result Value Ref Range   WBC 7.2 3.8 - 10.6 K/uL   RBC 3.52 (L) 4.40 - 5.90 MIL/uL   Hemoglobin 10.7 (L) 13.0 - 18.0 g/dL   HCT 32.2 (L) 40.0 - 52.0 %   MCV 91.7 80.0 -  100.0 fL   MCH 30.3 26.0 - 34.0 pg   MCHC 33.1 32.0 - 36.0 g/dL   RDW 14.4 11.5 - 14.5 %   Platelets 219 150 - 440 K/uL    Comment: Performed at [facility], [address] [place], [place] [address]  Troponin I     Status: None   Collection Time: 12/30/17 12:48 AM  Result Value Ref Range   Troponin I <0.03 <0.03 ng/mL    Comment: Performed at [facility], [address] [place], [place] [address]  Brain natriuretic peptide     Status: Abnormal   Collection Time: 12/30/17 12:48 AM  Result Value Ref Range   B Natriuretic Peptide 336.0 (H) 0.0 - 100.0 pg/mL    Comment: Performed at [facility], [address] [place], [place] [address]   Dg Chest 2 View  Result Date: 12/30/2017 CLINICAL DATA:  Shortness of breath. History of gastroesophageal reflux disease, congestive heart failure, emphysema, hypertension, heart disease, former smoker. EXAM: CHEST  2 VIEW COMPARISON:  12/05/2017 FINDINGS: Postoperative changes in the mediastinum. Cardiac pacemaker. Mild cardiac enlargement with mild pulmonary vascular congestion similar to previous study. Mild interstitial pattern to the lung bases likely represents early interstitial edema. No focal consolidation. No blunting of costophrenic angles. No pneumothorax. Mediastinal contours appear intact. Calcification of the aorta. Degenerative changes in the spine. Coronary artery calcifications. IMPRESSION: Cardiac enlargement with mild vascular congestion and early interstitial edema. No focal consolidation. Aortic atherosclerosis. Electronically Signed   By: Lucienne Capers M.D.   On: 12/30/2017 01:27    Review of Systems  Constitutional:  Negative for chills and fever.  HENT: Negative for sore throat and tinnitus.   Eyes: Negative for blurred vision and redness.  Respiratory: Positive for shortness of breath. Negative for cough.   Cardiovascular: Negative for chest pain, palpitations, orthopnea and PND.  Gastrointestinal: Negative for abdominal pain, diarrhea, nausea and vomiting.  Genitourinary: Negative for dysuria, frequency and urgency.  Musculoskeletal: Negative for joint pain and myalgias.  Skin: Negative for rash.       No lesions  Neurological: Negative for speech change, focal weakness and weakness.  Endo/Heme/Allergies: Does not bruise/bleed easily.       No temperature intolerance  Psychiatric/Behavioral: Negative for depression and suicidal ideas.    Blood pressure (!) 156/67, pulse 70, temperature 99.6 F (37.6 C), temperature source Oral, resp. rate (!) 25, height '5\' 8"'$  (1.727 m), weight 111.1 kg (245 lb), SpO2 94 %. Physical Exam  Vitals reviewed. Constitutional: He is oriented to person, place, and time. He appears well-developed and well-nourished. No distress.  HENT:  Head: Normocephalic and atraumatic.  Mouth/Throat: Oropharynx is clear and moist.  Eyes: Conjunctivae and EOM are normal. Pupils are equal, round, and reactive to light. No scleral icterus.  Neck: Normal range of motion. Neck supple. No JVD present. No tracheal deviation present. No thyromegaly present.  Cardiovascular: Normal rate, regular rhythm and normal heart sounds. Exam reveals no gallop and no friction rub.  No murmur heard. Respiratory: Breath sounds normal. He is in respiratory distress.  On BiPAP  GI: Soft. Bowel sounds are normal. He exhibits no distension. There is no tenderness.  Genitourinary:  Genitourinary Comments: Deferred  Musculoskeletal: Normal range of motion. He exhibits no edema.  Lymphadenopathy:    He has no cervical adenopathy.  Neurological: He is alert and oriented to person, place, and time. No cranial  nerve deficit.  Skin: Skin is warm and dry. No rash noted.  No erythema.  Psychiatric: He has a normal mood and affect. His behavior is normal. Judgment and thought content normal.     Assessment/Plan This is a 73 year old male admitted for CHF exacerbation. 1.  CHF: Systolic; acute on chronic.  The patient is received Lasix 40 mg IV for pulmonary edema.  He is currently on BiPAP to improve venous return and work of breathing.  Continue IV diuretics until patient is back to baseline.  Hold oral torsemide for now.  Continue Entresto and spironolactone. 2.  Hypertension: Uncontrolled; nitroglycerin paste applied.  Adjust Coreg as needed.  Consider hydralazine as needed. 3.  CAD: Stable; no indication of myocardial ischemia at this time.  Continue aspirin and Plavix, Ranexa and Imdur. 4.  Diabetes mellitus type 2: Continue basal insulin.  Sliding-scale insulin while hospitalized.  Patient discovered to have low blood sugar prior to admission.  Given an amp of D50. 5.   Hyperlipidemia: Continue statin therapy 6.  COPD: Continue inhaled corticosteroid as well as duonebs.   7.  BPH: Continue Proscar 8.  Gout: Stable; continue allopurinol 9.  Depression: Continue Prozac and Celexa 10.  DVT prophylaxis: Heparin 11.  GI prophylaxis: Pantoprazole per home regimen The patient is a full code.  Time spent on admission orders and critical care approximately 45 minutes.  Discussed with [facility] telemedicine  Harrie Foreman, MD 12/30/2017, 2:58 AM

## 2017-12-30 NOTE — Plan of Care (Signed)
  Progressing Clinical Measurements: Respiratory complications will improve 12/30/2017 1920 - Progressing by Liliane Channel, RN Activity: Risk for activity intolerance will decrease 12/30/2017 1920 - Progressing by Liliane Channel, RN Pain Managment: General experience of comfort will improve 12/30/2017 1920 - Progressing by Liliane Channel, RN Safety: Ability to remain free from injury will improve 12/30/2017 1920 - Progressing by Liliane Channel, RN

## 2017-12-30 NOTE — Progress Notes (Signed)
Inpatient Diabetes Program Recommendations  AACE/ADA: New Consensus Statement on Inpatient Glycemic Control (2015)  Target Ranges:  Prepandial:   less than 140 mg/dL      Peak postprandial:   less than 180 mg/dL (1-2 hours)      Critically ill patients:  140 - 180 mg/dL   Lab Results  Component Value Date   GLUCAP 187 (H) 12/30/2017   HGBA1C 5.9 (H) 12/05/2017    Review of Glycemic Control  Results for [name], [name] (MRN [id number]) as of 12/30/2017 14:45  Ref. Range 12/30/2017 03:20 12/30/2017 03:44 12/30/2017 08:01 12/30/2017 11:32  Glucose-Capillary Latest Ref Range: 65 - 99 mg/dL 45 (L) 170 (H) 133 (H) 187 (H)     Home DM Meds: Lantus 100 units QHS                             Humalog 0-18 units TID per SSI  Current Insulin Orders: Lantus 60 units QHS                                       Novolog Resistant Correction Scale/ SSI (0-20 units) TID AC  Consider decreasing Lantus to 40 units qhs (CBG on admission was 45mg /dl at 0320am)  Consider starting Novolog Meal Coverage: Novolog 3 units TID with meals (hold if pt eats <50% of meal)  Text page Dr. Jerelyn Charles at 2:52pm  Gentry Fitz, RN, BA, MHA, CDE Diabetes Coordinator Inpatient Diabetes Program  [phone] (Team Pager) [phone] ([place]) 12/30/2017 2:51 PM

## 2017-12-30 NOTE — ED Triage Notes (Signed)
Pt arrives via EMS tonight with c/o onset of shortness of breath. Per report, pt had wheezing and saturations 85% on RA. Pt was given duoneb and albuterol treatment en route, after treatment, saturations improved. Pt does wear O2 at night. Hx of COPD, CHF, pulm edema

## 2017-12-30 NOTE — Progress Notes (Signed)
Per patient report that primary MD directed him to stop lisinopril for 3 days before starting Entresto. Pt also reports he has been off lisinopril for 48 hours. Pharmacy informed. Dose held and oncoming RN informed.

## 2017-12-30 NOTE — Consult Note (Signed)
Name: [name] MRN: [id number] DOB: 03-01-1946    ADMISSION DATE:  12/30/2017 CONSULTATION DATE: 12/30/2017  REFERRING MD : Dr. Marcille Blanco   CHIEF COMPLAINT: Shortness of Breath   BRIEF PATIENT DESCRIPTION:  72 yo male admitted with acute on chronic hypoxic respiratory failure secondary to CHF exacerbation requiring Bipap   SIGNIFICANT EVENTS  01/25-Pt admitted to stepdown unit   STUDIES:  None   HISTORY OF PRESENT ILLNESS:   This is a 72 yo male with a PMH of Hypercholesteremia, HTN, MI, GERD, CABG, Dual ICD, Diabetes Mellitus, COPD, Chronic Diastolic Systolic CHF, EF 24-23%, Chronic O2 @5L , Prostate Cancer, Ischemic Cardiomyopathy, OSA on CPAP, and Anemia.  He presented to [facility] ER 01/25 via EMS with c/o shortness of breath onset of symptoms 01/24.  Upon EMS arrival pts O2 sats were 85% on RA and he received duoneb  en route to the ER.  In the ER his O2 sats were 91%, however pt remained in acute respiratory failure requiring Bipap.  CXR revealed interstitial edema and BNP 336, therefore he received 40 mg iv lasix.  He was subsequently admitted to the [facility] Unit by hospitalist team for further workup and treatment.   PAST MEDICAL HISTORY :   has a past medical history of Anemia, Cancer ([place]) (12/2013), Cardiogenic pulmonary edema ([place]) (12/19/2014), Cardiomyopathy, ischemic, CHF (congestive heart failure) ([place]), COPD (chronic obstructive pulmonary disease) ([place]), Diabetes mellitus without complication ([place]), GERD (gastroesophageal reflux disease), Hypercholesteremia, Hypertension, Myocardial infarction ([place]) ([id number]), and Shortness of breath dyspnea.  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 Prior to Admission medications   Medication Sig Start Date End Date  Taking? Authorizing Provider  albuterol (PROVENTIL HFA;VENTOLIN HFA) 108 (90 Base) MCG/ACT inhaler Inhale 2 puffs into the lungs 4 (four) times daily as needed for wheezing or shortness of breath.    Yes [provider]  albuterol-ipratropium (COMBIVENT) 18-103 MCG/ACT inhaler Inhale 1 puff into the lungs 2 times daily at 12 noon and 4 pm.   Yes [provider]  allopurinol (ZYLOPRIM) 100 MG tablet Take 100 mg by mouth daily.   Yes [provider]  atorvastatin (LIPITOR) 80 MG tablet Take 1 tablet (80 mg total) by mouth every evening. 04/21/17  Yes Theodoro [name], MD  budesonide-formoterol (SYMBICORT) 80-4.5 MCG/ACT inhaler Inhale 2 puffs into the lungs 2 (two) times daily.   Yes [provider]  calcium carbonate (TUMS - DOSED IN MG ELEMENTAL CALCIUM) 500 MG chewable tablet Chew 1 tablet by mouth as needed for indigestion or heartburn.   Yes [provider]  carvedilol (COREG) 6.25 MG tablet Take 6.25 mg by mouth 2 (two) times daily with a meal.   Yes [provider]  clopidogrel (PLAVIX) 75 MG tablet Take 75 mg by mouth every morning.   Yes [provider]  co-enzyme Q-10 30 MG capsule Take 30 mg by mouth daily.   Yes [provider]  ferrous sulfate 325 (65 FE) MG tablet Take 325 mg by mouth 2 (two) times daily with a meal.   Yes [provider]  finasteride (PROSCAR) 5 MG tablet Take 5 mg by mouth at bedtime.   Yes [provider]  FLUoxetine (PROZAC) 20 MG capsule Take 20 mg by mouth daily.   Yes [provider]  gabapentin (NEURONTIN) 300 MG capsule Take 300 mg by mouth 2 (two)  times daily.    Yes [provider]  insulin glargine (LANTUS) 100 UNIT/ML injection Inject 100 Units into the skin at bedtime.    Yes [provider]  insulin lispro (HUMALOG) 100 UNIT/ML KiwkPen Inject 0.18 Units into the skin 3 (three) times daily with meals. Per sliding scale   Yes [provider]  isosorbide mononitrate (IMDUR) 60 MG 24 hr tablet Take 60 mg by mouth daily.   Yes [provider]  loratadine (CLARITIN) 10 MG tablet Take 10 mg by mouth daily.   Yes [provider]  Multiple Vitamins-Minerals (CENTRUM SILVER PO) Take 1 tablet by mouth every morning.   Yes [provider]  nitroGLYCERIN (NITROSTAT) 0.4 MG SL tablet Place 0.4 mg under the tongue every 5 (five) minutes as needed for chest pain.   Yes [provider]  pantoprazole (PROTONIX) 40 MG tablet Take 80 mg by mouth 2 (two) times daily.    Yes [provider]  ranolazine (RANEXA) 500 MG 12 hr tablet Take 500 mg by mouth 2 (two) times daily.   Yes [provider]  sacubitril-valsartan (ENTRESTO) 24-26 MG Take 1 tablet by mouth 2 (two) times daily. 12/07/17  Yes Max Sane, MD  spironolactone (ALDACTONE) 25 MG tablet Take 12.5 mg by mouth daily.   Yes [provider]  torsemide (DEMADEX) 20 MG tablet Take 40 mg by mouth daily.   Yes [provider]  citalopram (CELEXA) 20 MG tablet Take 1 tablet by mouth 2 (two) times daily.    [provider]   Allergies  Allergen Reactions  . Benadryl [Diphenhydramine] Other (See Comments)    " Hyperactivity"  . Doxycycline Swelling    Pt went into pulmonary edema.  . Lopid [Gemfibrozil] Swelling    "I gain 1 pound a day for 30 days."    FAMILY HISTORY:  family history includes Alzheimer's disease in his father; CAD in his mother; Cancer in his father and mother; Diabetes in his mother; Heart disease in his father. SOCIAL HISTORY:  reports that he quit smoking about 21 years ago. He has a 60.00 pack-year smoking history. he has never used smokeless tobacco. He reports that he does not drink alcohol or use drugs.  REVIEW OF SYSTEMS: Positives in BOLD   Constitutional: Negative for fever, chills, weight loss, malaise/fatigue and diaphoresis.  HENT: Negative for hearing loss, ear pain, nosebleeds,  congestion, sore throat, neck pain, tinnitus and ear discharge.   Eyes: Negative for blurred vision, double vision, photophobia, pain, discharge and redness.  Respiratory: cough, hemoptysis, sputum production, shortness of breath, wheezing and stridor.   Cardiovascular: Negative for chest pain, palpitations, orthopnea, claudication, leg swelling and PND.  Gastrointestinal: Negative for heartburn, nausea, vomiting, abdominal pain, diarrhea, constipation, blood in stool and melena.  Genitourinary: Negative for dysuria, urgency, frequency, hematuria and flank pain.  Musculoskeletal: Negative for myalgias, back pain, joint pain and falls.  Skin: Negative for itching and rash.  Neurological: Negative for dizziness, tingling, tremors, sensory change, speech change, focal weakness, seizures, loss of consciousness, weakness and headaches.  Endo/Heme/Allergies: Negative for environmental allergies and polydipsia. Does not bruise/bleed easily.  SUBJECTIVE:  Pt states his breathing has improved   VITAL SIGNS: Temp:  [99.6 F (37.6 C)] 99.6 F (37.6 C) (01/25 0047) Pulse Rate:  [65-81] 65 (01/25 0300) Resp:  [20-30] 20 (01/25 0300) BP: (118-156)/(61-69) 146/61 (01/25 0300) SpO2:  [87 %-98 %] 98 % (01/25 0300) Weight:  [111.1 kg (245 lb)] 111.1 kg (245  lb) (01/25 0047)  PHYSICAL EXAMINATION: General: well developed, well nourished male, NAD on Bipap  Neuro: alert and oriented, follows commands HEENT: supple, no JVD Cardiovascular: paced, rrr, no M/R/G Lungs: diminished throughout, even, non labored  Abdomen: +BS x4, soft, non tender, non distended  Musculoskeletal: normal bulk and tone, no edema Skin: intact no rashes or lesions   Recent Labs  Lab 12/30/17 0048  NA 142  K 3.2*  CL 105  CO2 25  BUN 19  CREATININE 1.47*  GLUCOSE 51*   Recent Labs  Lab 12/30/17 0048  HGB 10.7*  HCT 32.2*  WBC 7.2  PLT 219   Dg Chest 2 View  Result Date: 12/30/2017 CLINICAL DATA:  Shortness of  breath. History of gastroesophageal reflux disease, congestive heart failure, emphysema, hypertension, heart disease, former smoker. EXAM: CHEST  2 VIEW COMPARISON:  12/05/2017 FINDINGS: Postoperative changes in the mediastinum. Cardiac pacemaker. Mild cardiac enlargement with mild pulmonary vascular congestion similar to previous study. Mild interstitial pattern to the lung bases likely represents early interstitial edema. No focal consolidation. No blunting of costophrenic angles. No pneumothorax. Mediastinal contours appear intact. Calcification of the aorta. Degenerative changes in the spine. Coronary artery calcifications. IMPRESSION: Cardiac enlargement with mild vascular congestion and early interstitial edema. No focal consolidation. Aortic atherosclerosis. Electronically Signed   By: Lucienne Capers M.D.   On: 12/30/2017 01:27    ASSESSMENT / PLAN: Acute on chronic hypoxic respiratory failure secondary to CHF exacerbation  Acute on chronic renal failure Hypokalemia Hypoglycemia Anemia  Hx: COPD, HTN, MI, Ischemic Cardiomyopathy, Dual ICD, OSA, Chronic O2 @5L , Diabetes Mellitus, and GERD P: Prn Bipap or supplemental O2 for dyspnea and/or hypoxia  Repeat CXR in am  Continue bronchodilator therapy  TSH pending  Continuous telemetry monitoring  Continue outpatient cardiac medications  Trend BMP Replace electrolytes as indicated  Monitor UOP  Subq heparin for VTE prophylaxis  Trend CBC Monitor for s/sx of bleeding  Transfuse for hgb <7 SSI  Follow hypoglycemic protocol   Marda Stalker, [place] Pager [phone] (please enter 7 digits) PCCM Consult Pager [phone] (please enter 7 digits)  Pulmonary/critical care attending  I have personally seen and examined Mr. Shetterly with nurse practitioner, I have reviewed revise and confirm note. I have independently performed history, physical, independently reviewed laboratory and imaging studies. In short,72  yo male with a PMH of Hypercholesteremia, HTN, MI, GERD, CABG, Dual ICD, Diabetes Mellitus, COPD, Chronic Diastolic Systolic CHF, EF 23-53%, Chronic O2 @5L , Prostate Cancer, Ischemic Cardiomyopathy, OSA on CPAP, and Anemia.  He presented to [facility] ER 01/25 via EMS with c/o shortness of breath onset of symptoms 01/24.  Upon EMS arrival pts O2 sats were 85% on RA and he received duoneb  en route to the ER.  In the ER his O2 sats were 91%, however pt remained in acute respiratory failure requiring Bipap.  CXR revealed interstitial edema and BNP 336, therefore he received 40 mg iv lasix.  He was subsequently admitted to the [facility] Unit by hospitalist team for further workup and treatment. He is doing quite well at this time, has been weaned off of noninvasive ventilation, presently on nasal cannula oxygen. BNP was elevated at 336, hypokalemic, hypomagnesemic, troponin has been within normal range. We'll replace electrolytes. At this point patient is stable for floor transfer  Hermelinda Dellen, D.O.

## 2017-12-30 NOTE — Progress Notes (Signed)
Patient transferred to Room 249. Report given to [facility], RN

## 2017-12-31 ENCOUNTER — Inpatient Hospital Stay: Payer: PPO

## 2017-12-31 LAB — GLUCOSE, CAPILLARY
Glucose-Capillary: 240 mg/dL — ABNORMAL HIGH (ref 65–99)
Glucose-Capillary: 132 mg/dL — ABNORMAL HIGH (ref 65–99)
Glucose-Capillary: 99 mg/dL (ref 65–99)
Glucose-Capillary: 145 mg/dL — ABNORMAL HIGH (ref 65–99)

## 2017-12-31 LAB — BASIC METABOLIC PANEL
Anion gap: 11 (ref 5–15)
BUN: 26 mg/dL — ABNORMAL HIGH (ref 6–20)
CHLORIDE: 105 mmol/L (ref 101–111)
CO2: 24 mmol/L (ref 22–32)
CREATININE: 1.16 mg/dL (ref 0.61–1.24)
Calcium: 8.6 mg/dL — ABNORMAL LOW (ref 8.9–10.3)
Glucose, Bld: 141 mg/dL — ABNORMAL HIGH (ref 65–99)
Potassium: 3.9 mmol/L (ref 3.5–5.1)
SODIUM: 140 mmol/L (ref 135–145)

## 2017-12-31 LAB — MAGNESIUM: MAGNESIUM: 1.9 mg/dL (ref 1.7–2.4)

## 2017-12-31 MED ORDER — IPRATROPIUM-ALBUTEROL 0.5-2.5 (3) MG/3ML IN SOLN
3.0000 mL | Freq: Two times a day (BID) | RESPIRATORY_TRACT | Status: DC
  Administered 2017-12-31 – 2018-01-01 (×2): 3 mL via RESPIRATORY_TRACT
  Filled 2017-12-31: qty 3

## 2017-12-31 NOTE — Progress Notes (Signed)
Ambulated with pt around the nurse's desk x1. At start pt's SPO2 was 97% on RA. During ambulation, his SPO2 never fell below 95%. At the end of walk pt's SPO2 was 96% on RA. Pt was able to walk and talk comfortably without any reports of SOB or dyspnea.

## 2017-12-31 NOTE — Plan of Care (Signed)
  Progressing Education: Knowledge of General Education information will improve 12/31/2017 0051 - Progressing by Tad Moore, RN Health Behavior/Discharge Planning: Ability to manage health-related needs will improve 12/31/2017 0051 - Progressing by Tad Moore, RN Clinical Measurements: Ability to maintain clinical measurements within normal limits will improve 12/31/2017 0051 - Progressing by Tad Moore, RN Will remain free from infection 12/31/2017 0051 - Progressing by Tad Moore, RN Diagnostic test results will improve 12/31/2017 0051 - Progressing by Tad Moore, RN Respiratory complications will improve 12/31/2017 0051 - Progressing by Tad Moore, RN Cardiovascular complication will be avoided 12/31/2017 0051 - Progressing by Tad Moore, RN Activity: Risk for activity intolerance will decrease 12/31/2017 0051 - Progressing by Tad Moore, RN Nutrition: Adequate nutrition will be maintained 12/31/2017 0051 - Progressing by Tad Moore, RN Coping: Level of anxiety will decrease 12/31/2017 0051 - Progressing by Tad Moore, RN Elimination: Will not experience complications related to bowel motility 12/31/2017 0051 - Progressing by Tad Moore, RN Will not experience complications related to urinary retention 12/31/2017 0051 - Progressing by Tad Moore, RN Pain Managment: General experience of comfort will improve 12/31/2017 0051 - Progressing by Tad Moore, RN Safety: Ability to remain free from injury will improve 12/31/2017 0051 - Progressing by Tad Moore, RN Skin Integrity: Risk for impaired skin integrity will decrease 12/31/2017 0051 - Progressing by Tad Moore, RN

## 2017-12-31 NOTE — Evaluation (Signed)
Physical Therapy Evaluation Patient Details Name: [name] MRN: [id number] DOB: September 26, 1946 Today's Date: 12/31/2017   History of Present Illness  presented to ER secondary to acute SOB; admitted with acute hypoxic respiratory failure related to CHF exacerbation.  Initially requiring BiPAP, now weaned to RA and tolerating with sats >93% throughout session.  Clinical Impression  Upon evaluation, patient alert and oriented; follows all commands and demonstrates good safety awareness/insight.  Bilat UE/LE strength and ROM grossly symmetrical and WFL; no focal weakness appreciated.  Able to complete bed mobility indep; sit/stand, basic transfers and gait (220') without assist device, sup/mod indep.  Good gait mechanics and overall fluidity without buckling, LOB or safety concern noted.  Maintains steady cadence and completes dynamic gait components without difficulty or gait deviation. Sats maintained >93% on RA throughout session. Patient with good understanding of role of continued mobility, activity pacing and overall energy conservation. Appears to be at baseline level of functional ability; patient/wife voice agreement. No acute PT needs identified at this time.  Will complete initial order; please re-consult should needs change.    Follow Up Recommendations No PT follow up    Equipment Recommendations       Recommendations for Other Services       Precautions / Restrictions Precautions Precautions: Fall Restrictions Weight Bearing Restrictions: No      Mobility  Bed Mobility Overal bed mobility: Modified Independent                Transfers Overall transfer level: Modified independent Equipment used: None                Ambulation/Gait Ambulation/Gait assistance: Supervision;Modified independent (Device/Increase time) Ambulation Distance (Feet): 220 Feet Assistive device: None   Gait velocity: 10' walk time, 6-7 seconds   General Gait Details:  reciprocal stepping pattern with good step height/length, good cadence and gait speed with no overt buckling, LOB or safety concern.  Completes dynamic gait components without difficulty or gait deviation.  Maintains sats >93% on RA throughout session.  Stairs            Wheelchair Mobility    Modified Rankin (Stroke Patients Only)       Balance Overall balance assessment: Modified Independent                                           Pertinent Vitals/Pain Pain Assessment: No/denies pain    Home Living Family/patient expects to be discharged to:: Private residence Living Arrangements: Spouse/significant other Available Help at Discharge: Family Type of Home: House Home Access: Stairs to enter Entrance Stairs-Rails: Right;Left;Can reach both Technical brewer of Steps: 2 Home Layout: One level Home Equipment: None      Prior Function Level of Independence: Independent         Comments: Indep with ADLs, household and community mobilization; denies fall history; home O2 at night only.     Hand Dominance        Extremity/Trunk Assessment   Upper Extremity Assessment Upper Extremity Assessment: Overall WFL for tasks assessed(grossly 4+ to 5/5 throughout)    Lower Extremity Assessment Lower Extremity Assessment: Overall WFL for tasks assessed(grossly 4+ to 5/5 throughout)       Communication   Communication: No difficulties  Cognition Arousal/Alertness: Awake/alert Behavior During Therapy: WFL for tasks assessed/performed Overall Cognitive Status: Within Functional Limits for tasks assessed  General Comments      Exercises Other Exercises Other Exercises: standing functional reach >7-8" without difficulty; good awareness of limits of stability, good trunk control and ability to return to upright position Other Exercises: 5x sit/stand without assist device, 14 seconds.  Good  LE strength/power; completing with minimal/no use of bilat UEs Other Exercises: Educated patient on importance of continued mobility (both in hospital and at discharge), energy conservation and activity pacing; patient voiced understanding and agreement.   Assessment/Plan    PT Assessment Patent does not need any further PT services  PT Problem List         PT Treatment Interventions      PT Goals (Current goals can be found in the Care Plan section)  Acute Rehab PT Goals Patient Stated Goal: to get closer to home PT Goal Formulation: All assessment and education complete, DC therapy Time For Goal Achievement: 12/31/17 Potential to Achieve Goals: Good    Frequency     Barriers to discharge        Co-evaluation               AM-PAC PT "6 Clicks" Daily Activity  Outcome Measure Difficulty turning over in bed (including adjusting bedclothes, sheets and blankets)?: None Difficulty moving from lying on back to sitting on the side of the bed? : None Difficulty sitting down on and standing up from a chair with arms (e.g., wheelchair, bedside commode, etc,.)?: None Help needed moving to and from a bed to chair (including a wheelchair)?: None Help needed walking in hospital room?: None Help needed climbing 3-5 steps with a railing? : None 6 Click Score: 24    End of Session Equipment Utilized During Treatment: Gait belt Activity Tolerance: Patient tolerated treatment well Patient left: in bed;with call bell/phone within reach;with bed alarm set;with family/visitor present   PT Visit Diagnosis: Difficulty in walking, not elsewhere classified (R26.2)    Time: 6010-9323 PT Time Calculation (min) (ACUTE ONLY): 22 min   Charges:   PT Evaluation $PT Eval Low Complexity: 1 Low PT Treatments $Therapeutic Activity: 8-22 mins   PT G Codes:        Ontario Pettengill H. Owens Shark, PT, DPT, NCS 12/31/17, 3:00 PM [phone]

## 2017-12-31 NOTE — Progress Notes (Signed)
[facility] [place] NAME: [name]    MR#:  [id number]  DATE OF BIRTH:  01-27-1946  SUBJECTIVE:  CHIEF COMPLAINT:   Chief Complaint  Patient presents with  . Shortness of Breath   - breathing is improved, off bipap this AM  REVIEW OF SYSTEMS:  Review of Systems  Constitutional: Negative for chills, fever and malaise/fatigue.  HENT: Negative for congestion, ear discharge, hearing loss and nosebleeds.   Eyes: Negative for blurred vision and double vision.  Respiratory: Positive for shortness of breath. Negative for cough and wheezing.   Cardiovascular: Negative for chest pain and palpitations.  Gastrointestinal: Negative for abdominal pain, constipation, diarrhea, nausea and vomiting.  Genitourinary: Negative for dysuria.  Musculoskeletal: Negative for myalgias.  Neurological: Positive for weakness. Negative for dizziness, speech change, focal weakness, seizures and headaches.    DRUG ALLERGIES:   Allergies  Allergen Reactions  . Benadryl [Diphenhydramine] Other (See Comments)    " Hyperactivity"  . Doxycycline Swelling    Pt went into pulmonary edema.  . Lopid [Gemfibrozil] Swelling    "I gain 1 pound a day for 30 days."    VITALS:  Blood pressure (!) 111/49, pulse 75, temperature 98.4 F (36.9 C), temperature source Axillary, resp. rate 18, height 5\' 8"  (1.727 m), weight 110.1 kg (242 lb 11.2 oz), SpO2 97 %.  PHYSICAL EXAMINATION:  Physical Exam  GENERAL:  72 y.o.-year-old patient lying in the bed with no acute distress.  EYES: Pupils equal, round, reactive to light and accommodation. No scleral icterus. Extraocular muscles intact.  HEENT: Head atraumatic, normocephalic. Oropharynx and nasopharynx clear.  NECK:  Supple, no jugular venous distention. No thyroid enlargement, no tenderness.  LUNGS: Normal breath sounds bilaterally, no wheezing, rales,rhonchi or crepitation. No use of accessory muscles of respiration.    CARDIOVASCULAR: S1, S2 normal. No murmurs, rubs, or gallops.  ABDOMEN: Soft, nontender, nondistended. Bowel sounds present. No organomegaly or mass.  EXTREMITIES: No pedal edema, cyanosis, or clubbing.  NEUROLOGIC: Cranial nerves II through XII are intact. Muscle strength 5/5 in all extremities. Sensation intact. Gait not checked.  PSYCHIATRIC: The patient is alert and oriented x 3.  SKIN: No obvious rash, lesion, or ulcer.    LABORATORY PANEL:   CBC Recent Labs  Lab 12/30/17 0048  WBC 7.2  HGB 10.7*  HCT 32.2*  PLT 219   ------------------------------------------------------------------------------------------------------------------  Chemistries  Recent Labs  Lab 12/30/17 0048 12/30/17 0405  NA 142  --   K 3.2* 3.2*  CL 105  --   CO2 25  --   GLUCOSE 51*  --   BUN 19  --   CREATININE 1.47*  --   CALCIUM 8.1*  --   MG  --  1.3*   ------------------------------------------------------------------------------------------------------------------  Cardiac Enzymes Recent Labs  Lab 12/30/17 0048  TROPONINI <0.03   ------------------------------------------------------------------------------------------------------------------  RADIOLOGY:  Dg Chest 2 View  Result Date: 12/31/2017 CLINICAL DATA:  Shortness of breath. EXAM: CHEST  2 VIEW COMPARISON:  December 30, 2017 FINDINGS: Stable AICD device. The heart, hila, and mediastinum are unchanged. No pneumothorax, pulmonary nodule, mass, or focal infiltrates. No overt edema. IMPRESSION: No active cardiopulmonary disease. Electronically Signed   By: Dorise Bullion III M.D   On: 12/31/2017 12:20   Dg Chest 2 View  Result Date: 12/30/2017 CLINICAL DATA:  Shortness of breath. History of gastroesophageal reflux disease, congestive heart failure, emphysema, hypertension, heart disease, former smoker. EXAM: CHEST  2 VIEW COMPARISON:  12/05/2017 FINDINGS: Postoperative changes in the mediastinum. Cardiac pacemaker. Mild cardiac  enlargement with mild pulmonary vascular congestion similar to previous study. Mild interstitial pattern to the lung bases likely represents early interstitial edema. No focal consolidation. No blunting of costophrenic angles. No pneumothorax. Mediastinal contours appear intact. Calcification of the aorta. Degenerative changes in the spine. Coronary artery calcifications. IMPRESSION: Cardiac enlargement with mild vascular congestion and early interstitial edema. No focal consolidation. Aortic atherosclerosis. Electronically Signed   By: Lucienne Capers M.D.   On: 12/30/2017 01:27    EKG:   Orders placed or performed during the hospital encounter of 12/30/17  . EKG 12-Lead  . EKG 12-Lead  . ED EKG within 10 minutes  . ED EKG within 10 minutes  . ED EKG  . ED EKG    ASSESSMENT AND PLAN:   72 year old male with past medical history significant for COPD not on home oxygen, systolic CHF, diabetes, GERD, hypertension and anemia presents to hospital secondary to worsening shortness of breath and noted to be in CHF exacerbation.  1. Acute on chronic systolic CHF exacerbation-started on IV Lasix, much improved breathing -Initially admitted to ICU for BiPAP. Now off of BiPAP -Echocardiogram is severely reduced ejection fraction, EF of 20-25% -Changed to oral torsemide, also on aldactone, entresto, coreg and statin -Encourage ambulation, continue to wean off oxygen.  2. Diabetes mellitus-on Lantus and sliding scale insulin  3. CAD-stable on Coreg, Plavix, statin, Imdur  4. COPD-stable. Continue inhalers  5. Hypokalemia-replaced while on Lasix  6. DVT prophylaxis-on subcutaneous heparin    All the records are reviewed and case discussed with Care Management/Social Workerr. Management plans discussed with the patient, family and they are in agreement.  CODE STATUS: Full Code  TOTAL TIME TAKING CARE OF THIS PATIENT: 37 minutes.   POSSIBLE D/C TOMORROW, DEPENDING ON CLINICAL  CONDITION.   Gladstone [name] M.D on 12/31/2017 at 12:47 PM  Between 7am to 6pm - Pager - [phone]  After 6pm go to [email or web address] - password [email or web address] [facility]  Office  [phone]  CC: Primary care physician; [name], MD

## 2017-12-31 NOTE — Plan of Care (Signed)
  Progressing Education: Knowledge of General Education information will improve 12/31/2017 2043 - Progressing by Tad Moore, RN Health Behavior/Discharge Planning: Ability to manage health-related needs will improve 12/31/2017 2043 - Progressing by Tad Moore, RN Clinical Measurements: Ability to maintain clinical measurements within normal limits will improve 12/31/2017 2043 - Progressing by Tad Moore, RN Will remain free from infection 12/31/2017 2043 - Progressing by Tad Moore, RN Diagnostic test results will improve 12/31/2017 2043 - Progressing by Tad Moore, RN Respiratory complications will improve 12/31/2017 2043 - Progressing by Tad Moore, RN Cardiovascular complication will be avoided 12/31/2017 2043 - Progressing by Tad Moore, RN Activity: Risk for activity intolerance will decrease 12/31/2017 2043 - Progressing by Tad Moore, RN Nutrition: Adequate nutrition will be maintained 12/31/2017 2043 - Progressing by Tad Moore, RN Coping: Level of anxiety will decrease 12/31/2017 2043 - Progressing by Tad Moore, RN Elimination: Will not experience complications related to bowel motility 12/31/2017 2043 - Progressing by Tad Moore, RN Will not experience complications related to urinary retention 12/31/2017 2043 - Progressing by Tad Moore, RN Pain Managment: General experience of comfort will improve 12/31/2017 2043 - Progressing by Tad Moore, RN Safety: Ability to remain free from injury will improve 12/31/2017 2043 - Progressing by Tad Moore, RN Skin Integrity: Risk for impaired skin integrity will decrease 12/31/2017 2043 - Progressing by Tad Moore, RN

## 2018-01-01 LAB — BASIC METABOLIC PANEL
ANION GAP: 9 (ref 5–15)
BUN: 27 mg/dL — ABNORMAL HIGH (ref 6–20)
CALCIUM: 8.5 mg/dL — AB (ref 8.9–10.3)
CHLORIDE: 103 mmol/L (ref 101–111)
CO2: 26 mmol/L (ref 22–32)
Creatinine, Ser: 1.2 mg/dL (ref 0.61–1.24)
GFR calc non Af Amer: 59 mL/min — ABNORMAL LOW (ref >60)
GLUCOSE: 124 mg/dL — AB (ref 65–99)
Potassium: 3.5 mmol/L (ref 3.5–5.1)
Sodium: 138 mmol/L (ref 135–145)

## 2018-01-01 LAB — GLUCOSE, CAPILLARY
Glucose-Capillary: 104 mg/dL — ABNORMAL HIGH (ref 65–99)
Glucose-Capillary: 134 mg/dL — ABNORMAL HIGH (ref 65–99)

## 2018-01-01 LAB — MAGNESIUM: Magnesium: 1.9 mg/dL (ref 1.7–2.4)

## 2018-01-01 MED ORDER — CARVEDILOL 3.125 MG PO TABS: 3.1250 mg | ORAL_TABLET | Freq: Two times a day (BID) | ORAL | 2 refills | Status: DC

## 2018-01-01 MED ORDER — INSULIN GLARGINE 100 UNIT/ML ~~LOC~~ SOLN: 60.0000 [IU] | Freq: Every day | SUBCUTANEOUS | 11 refills | Status: DC

## 2018-01-01 NOTE — Discharge Instructions (Signed)
Heart Failure Clinic appointment on January 05 2018 at 9:20am with Darylene Price, [place]. Please call [phone] to reschedule.   Two of your blood pressure medications have been held due to low blood pressures in the hospital. Please regroup with your primary care physician and cardiology to see if they can be started back as an outpatient. One of your blood pressure medication dose has been decreased. For the next couple of days please check your blood pressures before taking your diuretics at home. Take if the top number systolic is >301

## 2018-01-01 NOTE — Progress Notes (Signed)
IV and tele removed from patient. Discharge instructions given to patient and wife, verbalized understanding. No distress at this time. Wife to transport patient home.

## 2018-01-01 NOTE — Discharge Summary (Signed)
[facility] [place] NAME: [name]    MR#:  [id number]  DATE OF BIRTH:  06/14/1946  DATE OF ADMISSION:  12/30/2017   ADMITTING PHYSICIAN: Harrie Foreman, MD  DATE OF DISCHARGE:  01/01/18  PRIMARY CARE PHYSICIAN: [name], MD   ADMISSION DIAGNOSIS:   Acute on chronic systolic heart failure ([place]) [I50.23]  DISCHARGE DIAGNOSIS:   Active Problems:   Acute on chronic systolic heart failure ([place])   SECONDARY DIAGNOSIS:   Past Medical History:  Diagnosis Date  . Anemia   . Cancer ([place]) 12/2013   prostate  . Cardiogenic pulmonary edema ([place]) 12/19/2014  . Cardiomyopathy, ischemic   . CHF (congestive heart failure) ([place])   . COPD (chronic obstructive pulmonary disease) ([place])   . Diabetes mellitus without complication ([place])   . GERD (gastroesophageal reflux disease)   . Hypercholesteremia   . Hypertension   . Myocardial infarction ([place]) [id number]  . Shortness of breath dyspnea     HOSPITAL COURSE:   72 year old male with past medical history significant for COPD not on home oxygen, systolic CHF, diabetes, GERD, hypertension and anemia presents to hospital secondary to worsening shortness of breath and noted to be in CHF exacerbation.  1. Acute on chronic systolic CHF exacerbation- -received IV Lasix with much improved breathing -Initially admitted to ICU for BiPAP. Now off of BiPAP -Echocardiogram is severely reduced ejection fraction, EF of 20-25% -Currently on oral torsemide and low-dose Aldactone. Patient has been running low blood pressures. -Delene Loll has been discontinued for now at the time of discharge due to hypotension. Coreg dose has been reduced. She'll be advised to follow up with cardiology prior to restarting his medications. -Has a follow-up with CHF clinic in 3 days. Also advised to check blood pressures before taking his diuretics at home.  -Weaned off of oxygen. Uses CPAP at bedtime.  Ambulating well without any discomfort  2. Diabetes mellitus-on Lantus Lantus dose decreased to 60 units based on her sugars in the hospital.  3. CAD-stable on Coreg, Plavix, statin -Imdur discontinued at discharge due to hypotension. Can be restarted as outpatient. Follow-up with cardiology. -Also on Ranexa at home  4. COPD-stable. Continue inhalers  5. Hypokalemia-replaced   Stable for discharge today with close outpatient follow-up     DISCHARGE CONDITIONS:   Guarded  CONSULTS OBTAINED:    Intensivist Dr. Jefferson Fuel while in ICU  DRUG ALLERGIES:   Allergies  Allergen Reactions  . Benadryl [Diphenhydramine] Other (See Comments)    " Hyperactivity"  . Doxycycline Swelling    Pt went into pulmonary edema.  . Lopid [Gemfibrozil] Swelling    "I gain 1 pound a day for 30 days."   DISCHARGE MEDICATIONS:   Allergies as of 01/01/2018      Reactions   Benadryl [diphenhydramine] Other (See Comments)   " Hyperactivity"   Doxycycline Swelling   Pt went into pulmonary edema.   Lopid [gemfibrozil] Swelling   "I gain 1 pound a day for 30 days."      Medication List    STOP taking these medications   isosorbide mononitrate 60 MG 24 hr tablet Commonly known as:  IMDUR   sacubitril-valsartan 24-26 MG Commonly known as:  ENTRESTO     TAKE these medications   albuterol 108 (90 Base) MCG/ACT inhaler Commonly known as:  PROVENTIL HFA;VENTOLIN HFA Inhale 2 puffs into the lungs 4 (four) times daily as needed for wheezing or shortness of breath.  albuterol-ipratropium 18-103 MCG/ACT inhaler Commonly known as:  COMBIVENT Inhale 1 puff into the lungs 2 times daily at 12 noon and 4 pm.   allopurinol 100 MG tablet Commonly known as:  ZYLOPRIM Take 100 mg by mouth daily.   atorvastatin 80 MG tablet Commonly known as:  LIPITOR Take 1 tablet (80 mg total) by mouth every evening.   budesonide-formoterol 80-4.5 MCG/ACT inhaler Commonly known as:  SYMBICORT Inhale 2  puffs into the lungs 2 (two) times daily.   calcium carbonate 500 MG chewable tablet Commonly known as:  TUMS - dosed in mg elemental calcium Chew 1 tablet by mouth as needed for indigestion or heartburn.   carvedilol 3.125 MG tablet Commonly known as:  COREG Take 1 tablet (3.125 mg total) by mouth 2 (two) times daily with a meal. What changed:    medication strength  how much to take   CENTRUM SILVER PO Take 1 tablet by mouth every morning.   citalopram 20 MG tablet Commonly known as:  CELEXA Take 1 tablet by mouth 2 (two) times daily.   clopidogrel 75 MG tablet Commonly known as:  PLAVIX Take 75 mg by mouth every morning.   co-enzyme Q-10 30 MG capsule Take 30 mg by mouth daily.   ferrous sulfate 325 (65 FE) MG tablet Take 325 mg by mouth 2 (two) times daily with a meal.   finasteride 5 MG tablet Commonly known as:  PROSCAR Take 5 mg by mouth at bedtime.   FLUoxetine 20 MG capsule Commonly known as:  PROZAC Take 20 mg by mouth daily.   gabapentin 300 MG capsule Commonly known as:  NEURONTIN Take 300 mg by mouth 2 (two) times daily.   insulin glargine 100 UNIT/ML injection Commonly known as:  LANTUS Inject 0.6 mLs (60 Units total) into the skin at bedtime. What changed:  how much to take   insulin lispro 100 UNIT/ML KiwkPen Commonly known as:  HUMALOG Inject 0.18 Units into the skin 3 (three) times daily with meals. Per sliding scale   loratadine 10 MG tablet Commonly known as:  CLARITIN Take 10 mg by mouth daily.   nitroGLYCERIN 0.4 MG SL tablet Commonly known as:  NITROSTAT Place 0.4 mg under the tongue every 5 (five) minutes as needed for chest pain.   pantoprazole 40 MG tablet Commonly known as:  PROTONIX Take 80 mg by mouth 2 (two) times daily.   ranolazine 500 MG 12 hr tablet Commonly known as:  RANEXA Take 500 mg by mouth 2 (two) times daily.   spironolactone 25 MG tablet Commonly known as:  ALDACTONE Take 12.5 mg by mouth daily.     torsemide 20 MG tablet Commonly known as:  DEMADEX Take 40 mg by mouth daily.        DISCHARGE INSTRUCTIONS:   1. PCP follow-up in 1-2 weeks 2. Cardiology follow-up in 1 week  DIET:   Cardiac diet  ACTIVITY:   Activity as tolerated  OXYGEN:   Home Oxygen: No.  Oxygen Delivery: room air  DISCHARGE LOCATION:   home   If you experience worsening of your admission symptoms, develop shortness of breath, life threatening emergency, suicidal or homicidal thoughts you must seek medical attention immediately by calling 911 or calling your MD immediately  if symptoms less severe.  You Must read complete instructions/literature along with all the possible adverse reactions/side effects for all the Medicines you take and that have been prescribed to you. Take any new Medicines after you have completely understood  and accpet all the possible adverse reactions/side effects.   Please note  You were cared for by a hospitalist during your hospital stay. If you have any questions about your discharge medications or the care you received while you were in the hospital after you are discharged, you can call the unit and asked to speak with the hospitalist on call if the hospitalist that took care of you is not available. Once you are discharged, your primary care physician will handle any further medical issues. Please note that NO REFILLS for any discharge medications will be authorized once you are discharged, as it is imperative that you return to your primary care physician (or establish a relationship with a primary care physician if you do not have one) for your aftercare needs so that they can reassess your need for medications and monitor your lab values.    On the day of Discharge:  VITAL SIGNS:   Blood pressure (!) 93/43, pulse 83, temperature 97.8 F (36.6 C), temperature source Oral, resp. rate 16, height 5\' 8"  (1.727 m), weight 111.3 kg (245 lb 4.8 oz), SpO2 93 %.  PHYSICAL  EXAMINATION:    GENERAL:  72 y.o.-year-old patient lying in the bed with no acute distress.  EYES: Pupils equal, round, reactive to light and accommodation. No scleral icterus. Extraocular muscles intact.  HEENT: Head atraumatic, normocephalic. Oropharynx and nasopharynx clear.  NECK:  Supple, no jugular venous distention. No thyroid enlargement, no tenderness.  LUNGS: Normal breath sounds bilaterally, no wheezing, rales,rhonchi or crepitation. No use of accessory muscles of respiration.  CARDIOVASCULAR: S1, S2 normal. No murmurs, rubs, or gallops.  ABDOMEN: Soft, nontender, nondistended. Bowel sounds present. No organomegaly or mass.  EXTREMITIES: No pedal edema, cyanosis, or clubbing.  NEUROLOGIC: Cranial nerves II through XII are intact. Muscle strength 5/5 in all extremities. Sensation intact. Gait not checked.  PSYCHIATRIC: The patient is alert and oriented x 3.  SKIN: No obvious rash, lesion, or ulcer.    Marland Kitchen   DATA REVIEW:   CBC Recent Labs  Lab 12/30/17 0048  WBC 7.2  HGB 10.7*  HCT 32.2*  PLT 219    Chemistries  Recent Labs  Lab 01/01/18 0615  NA 138  K 3.5  CL 103  CO2 26  GLUCOSE 124*  BUN 27*  CREATININE 1.20  CALCIUM 8.5*  MG 1.9     Microbiology Results  Results for orders placed or performed during the hospital encounter of 12/30/17  MRSA PCR Screening     Status: None   Collection Time: 12/30/17  3:43 AM  Result Value Ref Range Status   MRSA by PCR NEGATIVE NEGATIVE Final    Comment:        The GeneXpert MRSA Assay (FDA approved for NASAL specimens only), is one component of a comprehensive MRSA colonization surveillance program. It is not intended to diagnose MRSA infection nor to guide or monitor treatment for MRSA infections. Performed at [facility], [address] [place], [place] [address]     RADIOLOGY:  No results found.   Management plans discussed with the patient, family and they are in agreement.  CODE  STATUS:     Code Status Orders  (From admission, onward)        Start     Ordered   12/30/17 0306  Full code  Continuous     12/30/17 0305    Code Status History    Date Active Date Inactive Code Status Order ID Comments User  Context   12/05/2017 11:05 12/06/2017 16:59 Full Code [id number]  Harrie Foreman, MD ED   07/17/2017 04:39 07/18/2017 17:59 Full Code [id number]  Harrie Foreman, MD Inpatient   07/01/2017 01:19 07/02/2017 15:46 Full Code [id number]  Lance Coon, MD Inpatient   04/20/2017 02:32 04/21/2017 18:16 Full Code [id number]  Saundra Shelling, MD Inpatient   01/31/2016 01:43 02/02/2016 21:11 Full Code [id number]  Hillary Bow, MD ED   12/12/2015 16:27 12/13/2015 15:29 Full Code [id number]  [name], MD Inpatient    Advance Directive Documentation     Most Recent Value  Type of Advance Directive  Healthcare Power of Attorney, Living will  Pre-existing out of facility DNR order (yellow form or pink MOST form)  No data  "MOST" Form in Place?  No data      TOTAL TIME TAKING CARE OF THIS PATIENT: 38 minutes.    Gladstone [name] M.D on 01/01/2018 at 12:14 PM  Between 7am to 6pm - Pager - [phone]  After 6pm go to [email or web address] - Proofreader  [employer] [facility]  Office  [phone]  CC: Primary care physician; [name], MD   Note: This dictation was prepared with Dragon dictation along with smaller phrase technology. Any transcriptional errors that result from this process are unintentional.

## 2018-01-05 ENCOUNTER — Encounter: Payer: Self-pay | Admitting: Family

## 2018-01-05 ENCOUNTER — Ambulatory Visit: Payer: PPO | Attending: Family | Admitting: Family

## 2018-01-05 VITALS — BP 123/51 | HR 78 | Resp 18 | Ht 68.0 in | Wt 245.2 lb

## 2018-01-05 DIAGNOSIS — Z8249 Family history of ischemic heart disease and other diseases of the circulatory system: Secondary | ICD-10-CM | POA: Insufficient documentation

## 2018-01-05 DIAGNOSIS — Z8546 Personal history of malignant neoplasm of prostate: Secondary | ICD-10-CM | POA: Diagnosis not present

## 2018-01-05 DIAGNOSIS — Z833 Family history of diabetes mellitus: Secondary | ICD-10-CM | POA: Diagnosis not present

## 2018-01-05 DIAGNOSIS — E119 Type 2 diabetes mellitus without complications: Secondary | ICD-10-CM | POA: Insufficient documentation

## 2018-01-05 DIAGNOSIS — E7800 Pure hypercholesterolemia, unspecified: Secondary | ICD-10-CM | POA: Insufficient documentation

## 2018-01-05 DIAGNOSIS — J449 Chronic obstructive pulmonary disease, unspecified: Secondary | ICD-10-CM | POA: Insufficient documentation

## 2018-01-05 DIAGNOSIS — I255 Ischemic cardiomyopathy: Secondary | ICD-10-CM | POA: Insufficient documentation

## 2018-01-05 DIAGNOSIS — Z951 Presence of aortocoronary bypass graft: Secondary | ICD-10-CM | POA: Diagnosis not present

## 2018-01-05 DIAGNOSIS — Z820 Family history of epilepsy and other diseases of the nervous system: Secondary | ICD-10-CM | POA: Diagnosis not present

## 2018-01-05 DIAGNOSIS — I2510 Atherosclerotic heart disease of native coronary artery without angina pectoris: Secondary | ICD-10-CM | POA: Insufficient documentation

## 2018-01-05 DIAGNOSIS — Z87891 Personal history of nicotine dependence: Secondary | ICD-10-CM | POA: Insufficient documentation

## 2018-01-05 DIAGNOSIS — Z79899 Other long term (current) drug therapy: Secondary | ICD-10-CM | POA: Insufficient documentation

## 2018-01-05 DIAGNOSIS — I110 Hypertensive heart disease with heart failure: Secondary | ICD-10-CM | POA: Insufficient documentation

## 2018-01-05 DIAGNOSIS — M109 Gout, unspecified: Secondary | ICD-10-CM | POA: Insufficient documentation

## 2018-01-05 DIAGNOSIS — Z7951 Long term (current) use of inhaled steroids: Secondary | ICD-10-CM | POA: Insufficient documentation

## 2018-01-05 DIAGNOSIS — Z95810 Presence of automatic (implantable) cardiac defibrillator: Secondary | ICD-10-CM | POA: Insufficient documentation

## 2018-01-05 DIAGNOSIS — E1143 Type 2 diabetes mellitus with diabetic autonomic (poly)neuropathy: Secondary | ICD-10-CM | POA: Diagnosis present

## 2018-01-05 DIAGNOSIS — I5022 Chronic systolic (congestive) heart failure: Secondary | ICD-10-CM | POA: Insufficient documentation

## 2018-01-05 DIAGNOSIS — Z881 Allergy status to other antibiotic agents status: Secondary | ICD-10-CM | POA: Diagnosis not present

## 2018-01-05 DIAGNOSIS — Z794 Long term (current) use of insulin: Secondary | ICD-10-CM | POA: Diagnosis not present

## 2018-01-05 DIAGNOSIS — I10 Essential (primary) hypertension: Secondary | ICD-10-CM

## 2018-01-05 DIAGNOSIS — I252 Old myocardial infarction: Secondary | ICD-10-CM | POA: Diagnosis not present

## 2018-01-05 DIAGNOSIS — Z888 Allergy status to other drugs, medicaments and biological substances status: Secondary | ICD-10-CM | POA: Insufficient documentation

## 2018-01-05 DIAGNOSIS — Z7902 Long term (current) use of antithrombotics/antiplatelets: Secondary | ICD-10-CM | POA: Insufficient documentation

## 2018-01-05 DIAGNOSIS — D649 Anemia, unspecified: Secondary | ICD-10-CM | POA: Insufficient documentation

## 2018-01-05 DIAGNOSIS — I25810 Atherosclerosis of coronary artery bypass graft(s) without angina pectoris: Secondary | ICD-10-CM | POA: Diagnosis not present

## 2018-01-05 DIAGNOSIS — K219 Gastro-esophageal reflux disease without esophagitis: Secondary | ICD-10-CM | POA: Insufficient documentation

## 2018-01-05 DIAGNOSIS — Z955 Presence of coronary angioplasty implant and graft: Secondary | ICD-10-CM | POA: Diagnosis not present

## 2018-01-05 DIAGNOSIS — G4733 Obstructive sleep apnea (adult) (pediatric): Secondary | ICD-10-CM | POA: Insufficient documentation

## 2018-01-05 LAB — GLUCOSE, CAPILLARY: Glucose-Capillary: 153 mg/dL — ABNORMAL HIGH (ref 65–99)

## 2018-01-05 NOTE — Patient Instructions (Signed)
Continue weighing daily and call for an overnight weight gain of > 2 pounds or a weekly weight gain of >5 pounds. 

## 2018-01-05 NOTE — Progress Notes (Signed)
Subjective:    Patient ID: [name], male    DOB: 08/13/1946, 72 y.o.   MRN: [id number]  Mr Castilla is a 72 y/o male with a history of prostate cancer, DM, hyperlipidemia, HTN, anemia, COPD, GERD, MI, previous tobacco use and chronic heart failure.   Echo report from 12/05/17 reviewed and showed an EF of 20-25% along with mild MR and moderate MR. Cardiac catheterization done 02/02/16 showed severed 3-vessel CAD with patent grafts. Continue medication management along with dual antiplatelet therapy.   Admitted 12/30/17 due to HF exacerbation. Initially needed IV lasix and then transitioned to oral diuretics. Needed bipap initially as well. Discharged after 2 days. Admitted 12/05/17 due to HF exacerbation. Cardiology consult obtained. Elevated troponins thought to be due to demand ischemia. Discharged the following day. Was in the ED 12/04/17 with chest pain where he was evaluated and released. Was in the ED 11/22/17 due to gout where he was treated and released.   He presents today for his follow-up visit although hasn't been seen in clinic since May 2017. He presents with a chief complaint of minimal shortness of breath upon moderate exertion. He describes this as being present for many years with varying levels of severity. He has associated fatigue, light-headedness and easy bruising along with this. He denies any chest pain, cough, edema, palpitations, wheezing, abdominal distention, difficulty sleeping or weight gain.  Past Medical History:  Diagnosis Date  . Anemia   . Cancer ([place]) 12/2013   prostate  . Cardiogenic pulmonary edema ([place]) 12/19/2014  . Cardiomyopathy, ischemic   . CHF (congestive heart failure) ([place])   . COPD (chronic obstructive pulmonary disease) ([place])   . Diabetes mellitus without complication ([place])   . GERD (gastroesophageal reflux disease)   . Hypercholesteremia   . Hypertension   . Myocardial infarction ([place]) [id number]  . Shortness of breath dyspnea     Past Surgical History:  Procedure Laterality Date  . CARDIAC CATHETERIZATION N/A 02/02/2016   Procedure: Left Heart Cath and Coronary Angiography;  Surgeon: [name], MD;  Location: [facility];  Service: Cardiovascular;  Laterality: N/A;  . CORONARY ANGIOPLASTY WITH STENT PLACEMENT    . CORONARY ARTERY BYPASS GRAFT  11/24/2010  . IMPLANTABLE CARDIOVERTER DEFIBRILLATOR (ICD) GENERATOR CHANGE Left 12/12/2015   Procedure: DUAL LEAD PLACEMENT CARDIAC DIFIBRILLATOR;  Surgeon: [name], MD;  Location: [facility];  Service: Cardiovascular;  Laterality: Left;  . TONSILLECTOMY     Family History  Problem Relation Age of Onset  . CAD Mother   . Cancer Mother   . Diabetes Mother   . Alzheimer's disease Father   . Cancer Father   . Heart disease Father    Social History   Tobacco Use  . Smoking status: Former Smoker    Packs/day: 2.00    Years: 30.00    Pack years: 60.00    Last attempt to quit: 12/03/1996    Years since quitting: 21.1  . Smokeless tobacco: Never Used  Substance Use Topics  . Alcohol use: No    Alcohol/week: 0.0 oz   Allergies  Allergen Reactions  . Benadryl [Diphenhydramine] Other (See Comments)    " Hyperactivity"  . Doxycycline Swelling    Pt went into pulmonary edema.  . Lopid [Gemfibrozil] Swelling    "I gain 1 pound a day for 30 days."   Prior to Admission medications   Medication Sig Start Date End Date Taking? Authorizing Provider  albuterol (PROVENTIL HFA;VENTOLIN HFA) 108 (  90 Base) MCG/ACT inhaler Inhale 2 puffs into the lungs 4 (four) times daily as needed for wheezing or shortness of breath.    Yes [provider]  albuterol-ipratropium (COMBIVENT) 18-103 MCG/ACT inhaler Inhale 1 puff into the lungs 2 times daily at 12 noon and 4 pm.   Yes [provider]  allopurinol (ZYLOPRIM) 100 MG tablet Take 100 mg by mouth daily.   Yes [provider]  atorvastatin (LIPITOR) 80 MG tablet Take 1 tablet (80 mg  total) by mouth every evening. 04/21/17  Yes Theodoro [name], MD  budesonide-formoterol (SYMBICORT) 80-4.5 MCG/ACT inhaler Inhale 2 puffs into the lungs 2 (two) times daily.   Yes [provider]  calcium carbonate (TUMS - DOSED IN MG ELEMENTAL CALCIUM) 500 MG chewable tablet Chew 1 tablet by mouth as needed for indigestion or heartburn.   Yes [provider]  carvedilol (COREG) 3.125 MG tablet Take 1 tablet (3.125 mg total) by mouth 2 (two) times daily with a meal. 01/01/18  Yes Gladstone [name], MD  citalopram (CELEXA) 20 MG tablet Take 1 tablet by mouth 2 (two) times daily.   Yes [provider]  clopidogrel (PLAVIX) 75 MG tablet Take 75 mg by mouth every morning.   Yes [provider]  co-enzyme Q-10 30 MG capsule Take 30 mg by mouth daily.   Yes [provider]  ferrous sulfate 325 (65 FE) MG tablet Take 325 mg by mouth 2 (two) times daily with a meal.   Yes [provider]  FLUoxetine (PROZAC) 20 MG capsule Take 20 mg by mouth daily.   Yes [provider]  gabapentin (NEURONTIN) 300 MG capsule Take 300 mg by mouth 2 (two) times daily.    Yes [provider]  insulin glargine (LANTUS) 100 UNIT/ML injection Inject 0.6 mLs (60 Units total) into the skin at bedtime. 01/01/18  Yes Gladstone [name], MD  insulin lispro (HUMALOG) 100 UNIT/ML KiwkPen Inject 0.18 Units into the skin 3 (three) times daily with meals. Per sliding scale   Yes [provider]  loratadine (CLARITIN) 10 MG tablet Take 10 mg by mouth daily.   Yes [provider]  Multiple Vitamins-Minerals (CENTRUM SILVER PO) Take 1 tablet by mouth every morning.   Yes [provider]  nitroGLYCERIN (NITROSTAT) 0.4 MG SL tablet Place 0.4 mg under the tongue every 5 (five) minutes as needed for chest pain.   Yes [provider]  pantoprazole (PROTONIX) 40 MG tablet Take 80 mg by mouth 2 (two) times daily.    Yes [provider]   ranolazine (RANEXA) 500 MG 12 hr tablet Take 500 mg by mouth 2 (two) times daily.   Yes [provider]  spironolactone (ALDACTONE) 25 MG tablet Take 12.5 mg by mouth daily.   Yes [provider]  tamsulosin (FLOMAX) 0.4 MG CAPS capsule Take 0.4 mg by mouth daily.   Yes [provider]  torsemide (DEMADEX) 20 MG tablet Take 40 mg by mouth daily.   Yes [provider]  finasteride (PROSCAR) 5 MG tablet Take 5 mg by mouth at bedtime.    [provider]    Review of Systems  Constitutional: Positive for fatigue. Negative for appetite change.  HENT: Negative for congestion, postnasal drip and sore throat.   Eyes: Negative.   Respiratory: Positive for shortness of breath. Negative for cough, chest tightness and wheezing.   Cardiovascular: Negative for chest pain, palpitations and leg swelling.  Gastrointestinal: Negative for abdominal distention and  abdominal pain.  Endocrine: Negative.   Genitourinary: Negative.   Musculoskeletal: Negative for back pain and neck pain.  Skin: Negative.   Allergic/Immunologic: Negative.   Neurological: Positive for light-headedness (with position changes) and numbness (& tingling in legs due to neuropathy). Negative for dizziness and headaches.  Hematological: Negative for adenopathy. Bruises/bleeds easily.  Psychiatric/Behavioral: Negative for dysphoric mood and sleep disturbance (wearing CPAP with oxygen @ 5L. sleeping on 2 pillows). The patient is not nervous/anxious.    Vitals:   01/05/18 0929  BP: (!) 123/51  Pulse: 78  Resp: 18  SpO2: 99%  Weight: 245 lb 4 oz (111.2 kg)  Height: 5\' 8"  (1.727 m)   Wt Readings from Last 3 Encounters:  01/05/18 245 lb 4 oz (111.2 kg)  01/01/18 245 lb 4.8 oz (111.3 kg)  12/06/17 243 lb 8 oz (110.5 kg)   Lab Results  Component Value Date   CREATININE 1.20 01/01/2018   CREATININE 1.16 12/31/2017   CREATININE 1.47 (H) 12/30/2017      Objective:   Physical Exam   Constitutional: He is oriented to person, place, and time. He appears well-developed and well-nourished.  HENT:  Head: Normocephalic and atraumatic.  Eyes: Conjunctivae are normal. Pupils are equal, round, and reactive to light.  Neck: Normal range of motion. Neck supple.  Cardiovascular: Normal rate and regular rhythm.  Pulmonary/Chest: Effort normal. He has no wheezes. He has no rales.  Abdominal: Soft. He exhibits no distension. There is no tenderness.  Musculoskeletal: He exhibits no edema or tenderness.  Neurological: He is alert and oriented to person, place, and time.  Skin: Skin is warm and dry.  Psychiatric: He has a normal mood and affect. His behavior is normal. Thought content normal.  Nursing note and vitals reviewed.     Assessment & Plan:   1: Chronic heart failure with reduced ejection fraction-  - NYHA class II - euvolemic today - weighing daily and he says that his weight has been stable. Reminded to call for an overnight weight gain of >2 pounds or a weekly weight gain of >5 pounds - not adding salt but does eat fast food often. Talking about going to [employer] after clinic visit. Discussed the importance of closely following a 2000mg  sodium diet and to limit fast food - saw cardiology Nehemiah Massed) 12/19/17 - BNP from 12/30/17 was 336.0  2: HTN- - BP looks good today - saw PCP (Tumey) 12/02/17 - BNP from 01/01/18 reviewed and showed sodium 138, potassium 3.5 and GFR 59   3: Obstructive sleep apnea-   - wearing CPAP nightly along with oxygen at 5L - reports sleeping well  4: Diabetes-  - nonfasting glucose in clinic was 153 - ate caramel candy prior to clinic visit - A1c on 12/05/17 was 5.9%  Patient did not bring his medications nor a list.   Due to frequent admissions, will have patient return in 1 month or sooner for any questions/problems before then.

## 2018-01-16 ENCOUNTER — Other Ambulatory Visit: Payer: Self-pay | Admitting: Cardiology

## 2018-01-30 DIAGNOSIS — M25562 Pain in left knee: Secondary | ICD-10-CM | POA: Diagnosis not present

## 2018-01-30 DIAGNOSIS — M7052 Other bursitis of knee, left knee: Secondary | ICD-10-CM | POA: Diagnosis not present

## 2018-01-31 NOTE — Progress Notes (Deleted)
   Patient ID: [name], male    DOB: June 12, 1946, 72 y.o.   MRN: [id number]  HPI  Mr Fosnaugh is a 72 y/o male with a history of prostate cancer, DM, hyperlipidemia, HTN, anemia, COPD, GERD, MI, previous tobacco use and chronic heart failure.   Echo report from 12/05/17 reviewed and showed an EF of 20-25% along with mild MR and moderate MR. Cardiac catheterization done 02/02/16 showed severed 3-vessel CAD with patent grafts. Continue medication management along with dual antiplatelet therapy.   Admitted 12/30/17 due to HF exacerbation. Initially needed IV lasix and then transitioned to oral diuretics. Needed bipap initially as well. Discharged after 2 days. Admitted 12/05/17 due to HF exacerbation. Cardiology consult obtained. Elevated troponins thought to be due to demand ischemia. Discharged the following day. Was in the ED 12/04/17 with chest pain where he was evaluated and released. Was in the ED 11/22/17 due to gout where he was treated and released.   He presents today for a follow-up visit with a chief complaint of  Review of Systems    Physical Exam  Assessment & Plan:  1: Chronic heart failure with reduced ejection fraction-  - NYHA class II - euvolemic today - weighing daily and he says that his weight has been stable. Reminded to call for an overnight weight gain of >2 pounds or a weekly weight gain of >5 pounds - not adding salt but does eat fast food often. Discussed the importance of closely following a 2000mg  sodium diet and to limit fast food - saw cardiology Nehemiah Massed) 12/19/17 - BNP from 12/30/17 was 336.0  2: HTN- - BP looks good today - saw PCP (Tumey) 12/02/17 - BNP from 01/01/18 reviewed and showed sodium 138, potassium 3.5 and GFR 59   3: Obstructive sleep apnea-   - wearing CPAP nightly along with oxygen at 5L - reports sleeping well  4: Diabetes-  - nonfasting glucose in clinic was  -  - A1c on 12/05/17 was 5.9%  Patient did not bring his  medications nor a list.

## 2018-02-02 ENCOUNTER — Telehealth: Payer: Self-pay | Admitting: Family

## 2018-02-02 ENCOUNTER — Ambulatory Visit: Payer: PPO | Admitting: Family

## 2018-02-02 NOTE — Telephone Encounter (Signed)
Patient did not show for his Heart Failure Clinic appointment on 02/02/18. Will attempt to reschedule.   This is the 7th appointment that he has missed.

## 2018-02-12 ENCOUNTER — Emergency Department
Admission: EM | Admit: 2018-02-12 | Discharge: 2018-02-12 | Disposition: A | Discharge status: Home / Self Care | Payer: PPO | Attending: Emergency Medicine | Admitting: Emergency Medicine

## 2018-02-12 ENCOUNTER — Emergency Department: Payer: PPO

## 2018-02-12 ENCOUNTER — Other Ambulatory Visit: Payer: Self-pay

## 2018-02-12 DIAGNOSIS — Z8546 Personal history of malignant neoplasm of prostate: Secondary | ICD-10-CM | POA: Diagnosis not present

## 2018-02-12 DIAGNOSIS — I110 Hypertensive heart disease with heart failure: Secondary | ICD-10-CM | POA: Diagnosis not present

## 2018-02-12 DIAGNOSIS — R0602 Shortness of breath: Secondary | ICD-10-CM | POA: Diagnosis not present

## 2018-02-12 DIAGNOSIS — E119 Type 2 diabetes mellitus without complications: Secondary | ICD-10-CM | POA: Diagnosis not present

## 2018-02-12 DIAGNOSIS — J449 Chronic obstructive pulmonary disease, unspecified: Secondary | ICD-10-CM | POA: Diagnosis not present

## 2018-02-12 DIAGNOSIS — I509 Heart failure, unspecified: Secondary | ICD-10-CM | POA: Diagnosis not present

## 2018-02-12 DIAGNOSIS — Z79899 Other long term (current) drug therapy: Secondary | ICD-10-CM | POA: Insufficient documentation

## 2018-02-12 LAB — BASIC METABOLIC PANEL
Anion gap: 11 (ref 5–15)
BUN: 15 mg/dL (ref 6–20)
CALCIUM: 8.2 mg/dL — AB (ref 8.9–10.3)
CO2: 27 mmol/L (ref 22–32)
Chloride: 103 mmol/L (ref 101–111)
Creatinine, Ser: 1.07 mg/dL (ref 0.61–1.24)
GFR calc non Af Amer: >60 mL/min (ref >60)
Glucose, Bld: 186 mg/dL — ABNORMAL HIGH (ref 65–99)
Potassium: 3.5 mmol/L (ref 3.5–5.1)
Sodium: 141 mmol/L (ref 135–145)

## 2018-02-12 LAB — CBC
HCT: 33 % — ABNORMAL LOW (ref 40.0–52.0)
Hemoglobin: 10.9 g/dL — ABNORMAL LOW (ref 13.0–18.0)
MCH: 29.9 pg (ref 26.0–34.0)
MCHC: 33.1 g/dL (ref 32.0–36.0)
MCV: 90.3 fL (ref 80.0–100.0)
Platelets: 170 10*3/uL (ref 150–440)
RBC: 3.65 MIL/uL — ABNORMAL LOW (ref 4.40–5.90)
RDW: 15.6 % — AB (ref 11.5–14.5)
WBC: 11.5 10*3/uL — ABNORMAL HIGH (ref 3.8–10.6)

## 2018-02-12 LAB — TROPONIN I

## 2018-02-12 MED ORDER — POTASSIUM CHLORIDE CRYS ER 20 MEQ PO TBCR
10.0000 meq | EXTENDED_RELEASE_TABLET | Freq: Once | ORAL | Status: AC
  Administered 2018-02-12: 10 meq via ORAL
  Filled 2018-02-12: qty 1

## 2018-02-12 MED ORDER — FUROSEMIDE 10 MG/ML IJ SOLN
60.0000 mg | Freq: Once | INTRAMUSCULAR | Status: AC
  Administered 2018-02-12: 60 mg via INTRAVENOUS
  Filled 2018-02-12: qty 8

## 2018-02-12 NOTE — Discharge Instructions (Signed)
Please seek medical attention for any high fevers, chest pain, shortness of breath, change in behavior, persistent vomiting, bloody stool or any other new or concerning symptoms.  

## 2018-02-12 NOTE — ED Provider Notes (Signed)
[facility] Emergency Department Provider Note  ____________________________________________   I have reviewed the triage vital signs and the nursing notes.   HISTORY  Chief Complaint Shortness of Breath   History limited by: Not Limited   HPI [name] is a 72 y.o. male who presents to the emergency department today because of concerns for possible CHF exacerbation.  Patient does have a history of CHF.  He states that he has had roughly 13 exacerbations of it.  Started feeling some increasing shortness of breath today.  It was worse with exertion.  Has had some associated chest pressure.  He feels like he came in before it got as bad as it normally well as he does not feel any rattling in his lungs.  He denies any lower extremity edema although states he never gets it.  He also says that he was told not to take his Lasix anymore.  He is now on torsemide.  He denies any fevers.    Per medical record review patient has a history of COPD, CHF.  Past Medical History:  Diagnosis Date  . Anemia   . Cancer ([place]) 12/2013   prostate  . Cardiogenic pulmonary edema ([place]) 12/19/2014  . Cardiomyopathy, ischemic   . CHF (congestive heart failure) ([place])   . COPD (chronic obstructive pulmonary disease) ([place])   . Diabetes mellitus without complication ([place])   . GERD (gastroesophageal reflux disease)   . Hypercholesteremia   . Hypertension   . Myocardial infarction ([place]) [id number]  . Shortness of breath dyspnea     Patient Active Problem List   Diagnosis Date Noted  . Acute on chronic systolic CHF (congestive heart failure) ([place]) 04/21/2017  . Hypokalemia 04/21/2017  . Hypomagnesemia 04/21/2017  . Essential hypertension 04/21/2017  . Hyperlipidemia 04/21/2017  . Coronary artery disease 04/21/2017  . COPD with chronic bronchitis ([place]) 02/13/2016  . Obstructive sleep apnea 02/13/2016  . Diabetes ([place]) 02/13/2016  . Chronic systolic HF (heart failure)  ([place]) 12/12/2015    Past Surgical History:  Procedure Laterality Date  . CARDIAC CATHETERIZATION N/A 02/02/2016   Procedure: Left Heart Cath and Coronary Angiography;  Surgeon: [name], MD;  Location: [facility];  Service: Cardiovascular;  Laterality: N/A;  . CORONARY ANGIOPLASTY WITH STENT PLACEMENT    . CORONARY ARTERY BYPASS GRAFT  11/24/2010  . IMPLANTABLE CARDIOVERTER DEFIBRILLATOR (ICD) GENERATOR CHANGE Left 12/12/2015   Procedure: DUAL LEAD PLACEMENT CARDIAC DIFIBRILLATOR;  Surgeon: [name], MD;  Location: [facility];  Service: Cardiovascular;  Laterality: Left;  . TONSILLECTOMY      Prior to Admission medications   Medication Sig Start Date End Date Taking? Authorizing Provider  albuterol (PROVENTIL HFA;VENTOLIN HFA) 108 (90 Base) MCG/ACT inhaler Inhale 2 puffs into the lungs 4 (four) times daily as needed for wheezing or shortness of breath.     [provider]  albuterol-ipratropium (COMBIVENT) 18-103 MCG/ACT inhaler Inhale 1 puff into the lungs 2 times daily at 12 noon and 4 pm.    [provider]  allopurinol (ZYLOPRIM) 100 MG tablet Take 100 mg by mouth daily.    [provider]  atorvastatin (LIPITOR) 80 MG tablet Take 1 tablet (80 mg total) by mouth every evening. 04/21/17   Theodoro [name], MD  budesonide-formoterol (SYMBICORT) 80-4.5 MCG/ACT inhaler Inhale 2 puffs into the lungs 2 (two) times daily.    [provider]  calcium carbonate (TUMS - DOSED IN MG ELEMENTAL CALCIUM) 500 MG chewable tablet Chew  1 tablet by mouth as needed for indigestion or heartburn.    [provider]  carvedilol (COREG) 3.125 MG tablet Take 1 tablet (3.125 mg total) by mouth 2 (two) times daily with a meal. 01/01/18   Gladstone [name], MD  citalopram (CELEXA) 20 MG tablet Take 1 tablet by mouth 2 (two) times daily.    [provider]  clopidogrel (PLAVIX) 75 MG tablet Take 75 mg by mouth every morning.    [provider]  co-enzyme Q-10 30 MG capsule Take 30 mg by mouth daily.    [provider]  ferrous sulfate 325 (65 FE) MG tablet Take 325 mg by mouth 2 (two) times daily with a meal.    [provider]  finasteride (PROSCAR) 5 MG tablet Take 5 mg by mouth at bedtime.    [provider]  FLUoxetine (PROZAC) 20 MG capsule Take 20 mg by mouth daily.    [provider]  gabapentin (NEURONTIN) 300 MG capsule Take 300 mg by mouth 2 (two) times daily.     [provider]  insulin glargine (LANTUS) 100 UNIT/ML injection Inject 0.6 mLs (60 Units total) into the skin at bedtime. 01/01/18   Gladstone [name], MD  insulin lispro (HUMALOG) 100 UNIT/ML KiwkPen Inject 0.18 Units into the skin 3 (three) times daily with meals. Per sliding scale    [provider]  loratadine (CLARITIN) 10 MG tablet Take 10 mg by mouth daily.    [provider]  Multiple Vitamins-Minerals (CENTRUM SILVER PO) Take 1 tablet by mouth every morning.    [provider]  nitroGLYCERIN (NITROSTAT) 0.4 MG SL tablet Place 0.4 mg under the tongue every 5 (five) minutes as needed for chest pain.    [provider]  pantoprazole (PROTONIX) 40 MG tablet Take 80 mg by mouth 2 (two) times daily.     [provider]  ranolazine (RANEXA) 500 MG 12 hr tablet Take 500 mg by mouth 2 (two) times daily.    [provider]  spironolactone (ALDACTONE) 25 MG tablet Take 12.5 mg by mouth daily.    [provider]  tamsulosin (FLOMAX) 0.4 MG CAPS capsule Take 0.4 mg by mouth daily.    [provider]  torsemide (DEMADEX) 20 MG tablet Take 40 mg by mouth daily.    [provider]    Allergies Benadryl [diphenhydramine]; Doxycycline; and Lopid [gemfibrozil]  Family History  Problem Relation Age of Onset  . CAD Mother   . Cancer Mother   . Diabetes Mother   . Alzheimer's disease Father   . Cancer Father   . Heart disease  Father     Social History Social History   Tobacco Use  . Smoking status: Former Smoker    Packs/day: 2.00    Years: 30.00    Pack years: 60.00    Last attempt to quit: 12/03/1996    Years since quitting: 21.2  . Smokeless tobacco: Never Used  Substance Use Topics  . Alcohol use: No    Alcohol/week: 0.0 oz  . Drug use: No    Review of Systems Constitutional: No fever/chills Eyes: No visual changes. ENT: No sore throat. Cardiovascular: Positive for chest pressure. Respiratory: Positive for shortness of breath. Gastrointestinal: No abdominal pain.  Positive for abdominal swelling. Genitourinary: Negative for dysuria. Musculoskeletal: Negative for back pain. Skin: Negative for rash. Neurological: Negative for headaches, focal weakness or numbness.  ____________________________________________   PHYSICAL EXAM:  VITAL SIGNS: ED Triage Vitals  Enc Vitals Group     BP 02/12/18 1516 124/73     Pulse Rate 02/12/18 1516 79     Resp 02/12/18 1516 20     Temp 02/12/18 1516 97.9 F (36.6 C)     Temp Source 02/12/18 1516 Oral     SpO2 02/12/18 1516 99 %     Weight 02/12/18 1512 254 lb (115.2 kg)     Height 02/12/18 1512 5\' 8"  (1.727 m)     Head Circumference --      Peak Flow --      Pain Score 02/12/18 1512 0    Constitutional: Alert and oriented. Well appearing and in no distress. Eyes: Conjunctivae are normal.  ENT   Head: Normocephalic and atraumatic.   Nose: No congestion/rhinnorhea.   Mouth/Throat: Mucous membranes are moist.   Neck: No stridor. Hematological/Lymphatic/Immunilogical: No cervical lymphadenopathy. Cardiovascular: Normal rate, regular rhythm.  No murmurs, rubs, or gallops.  Respiratory: Normal respiratory effort without tachypnea nor retractions. Breath sounds are clear and equal bilaterally. No wheezes/rales/rhonchi. Gastrointestinal: Soft and non tender. No rebound. No guarding.  Genitourinary: Deferred Musculoskeletal: Normal  range of motion in all extremities. Trace bilateral lower extremity edema. Neurologic:  Normal speech and language. No gross focal neurologic deficits are appreciated.  Skin:  Skin is warm, dry and intact. No rash noted. Psychiatric: Mood and affect are normal. Speech and behavior are normal. Patient exhibits appropriate insight and judgment.  ____________________________________________    LABS (pertinent positives/negatives)  Trop <0.03 BMP na 141, k 3.5, glu 186, cr 1.07 CBC wbc 11.5, hgb 10.9, plt 170  ____________________________________________   EKG  I, Nance Pear, attending physician, personally viewed and interpreted this EKG  EKG Time: 1506 Rate: 84 Rhythm: ventricular paced rhythm Axis: left axis deviation Intervals: qtc 541 QRS: paced rhythm ST changes: no st elevation Impression: abnormal ekg  ____________________________________________    RADIOLOGY  CXR No edema, no pneumonia  ____________________________________________   PROCEDURES  Procedures  ____________________________________________   INITIAL IMPRESSION / ASSESSMENT AND PLAN / ED COURSE  Pertinent labs & imaging results that were available during my care of the patient were reviewed by me and considered in my medical decision making (see chart for details).  Presented to the emergency department today because of concerns for shortness of breath and CHF exacerbation.  On exam patient no acute distress.  Very minimal lower extremity edema.  Chest x-ray without any obvious pulmonary edema.  Patient was given dose of IV Lasix here with good urine output.  He did feel better.  Discussed with patient importance of follow-up.  Will give heart failure clinic follow-up.   ____________________________________________   FINAL CLINICAL IMPRESSION(S) / ED DIAGNOSES  Final diagnoses:  Congestive heart failure, unspecified HF chronicity, unspecified heart failure type [facility])     Note: This  dictation was prepared with Dragon dictation. Any transcriptional errors that result from this process are unintentional     Nance Pear, MD 02/12/18 [id number]

## 2018-02-12 NOTE — ED Triage Notes (Signed)
Pt c/o increased SOB since this morning.. States he has a hx of pulmonary edema with sudden onset. States he has gained 3lb in a day. Pt is in NAD at present,. Able to speak in complete sentences.

## 2018-02-13 ENCOUNTER — Encounter: Payer: Self-pay | Admitting: Family

## 2018-02-13 ENCOUNTER — Ambulatory Visit: Payer: PPO | Attending: Family | Admitting: Family

## 2018-02-13 VITALS — BP 108/47 | HR 87 | Resp 18 | Ht 68.0 in | Wt 251.5 lb

## 2018-02-13 DIAGNOSIS — E119 Type 2 diabetes mellitus without complications: Secondary | ICD-10-CM | POA: Insufficient documentation

## 2018-02-13 DIAGNOSIS — Z955 Presence of coronary angioplasty implant and graft: Secondary | ICD-10-CM | POA: Diagnosis not present

## 2018-02-13 DIAGNOSIS — J449 Chronic obstructive pulmonary disease, unspecified: Secondary | ICD-10-CM | POA: Diagnosis not present

## 2018-02-13 DIAGNOSIS — I5022 Chronic systolic (congestive) heart failure: Secondary | ICD-10-CM

## 2018-02-13 DIAGNOSIS — I255 Ischemic cardiomyopathy: Secondary | ICD-10-CM | POA: Diagnosis not present

## 2018-02-13 DIAGNOSIS — E7800 Pure hypercholesterolemia, unspecified: Secondary | ICD-10-CM | POA: Insufficient documentation

## 2018-02-13 DIAGNOSIS — Z8249 Family history of ischemic heart disease and other diseases of the circulatory system: Secondary | ICD-10-CM | POA: Diagnosis not present

## 2018-02-13 DIAGNOSIS — I10 Essential (primary) hypertension: Secondary | ICD-10-CM

## 2018-02-13 DIAGNOSIS — Z794 Long term (current) use of insulin: Secondary | ICD-10-CM | POA: Insufficient documentation

## 2018-02-13 DIAGNOSIS — I2510 Atherosclerotic heart disease of native coronary artery without angina pectoris: Secondary | ICD-10-CM | POA: Diagnosis not present

## 2018-02-13 DIAGNOSIS — E1143 Type 2 diabetes mellitus with diabetic autonomic (poly)neuropathy: Secondary | ICD-10-CM

## 2018-02-13 DIAGNOSIS — Z79899 Other long term (current) drug therapy: Secondary | ICD-10-CM | POA: Insufficient documentation

## 2018-02-13 DIAGNOSIS — E785 Hyperlipidemia, unspecified: Secondary | ICD-10-CM | POA: Insufficient documentation

## 2018-02-13 DIAGNOSIS — Z881 Allergy status to other antibiotic agents status: Secondary | ICD-10-CM | POA: Insufficient documentation

## 2018-02-13 DIAGNOSIS — I110 Hypertensive heart disease with heart failure: Secondary | ICD-10-CM | POA: Insufficient documentation

## 2018-02-13 DIAGNOSIS — K219 Gastro-esophageal reflux disease without esophagitis: Secondary | ICD-10-CM | POA: Diagnosis not present

## 2018-02-13 DIAGNOSIS — Z7902 Long term (current) use of antithrombotics/antiplatelets: Secondary | ICD-10-CM | POA: Insufficient documentation

## 2018-02-13 DIAGNOSIS — Z888 Allergy status to other drugs, medicaments and biological substances status: Secondary | ICD-10-CM | POA: Diagnosis not present

## 2018-02-13 DIAGNOSIS — Z87891 Personal history of nicotine dependence: Secondary | ICD-10-CM | POA: Diagnosis not present

## 2018-02-13 DIAGNOSIS — I252 Old myocardial infarction: Secondary | ICD-10-CM | POA: Insufficient documentation

## 2018-02-13 DIAGNOSIS — G4733 Obstructive sleep apnea (adult) (pediatric): Secondary | ICD-10-CM | POA: Diagnosis not present

## 2018-02-13 DIAGNOSIS — Z951 Presence of aortocoronary bypass graft: Secondary | ICD-10-CM | POA: Insufficient documentation

## 2018-02-13 DIAGNOSIS — R0602 Shortness of breath: Secondary | ICD-10-CM | POA: Diagnosis present

## 2018-02-13 LAB — GLUCOSE, CAPILLARY: GLUCOSE-CAPILLARY: 137 mg/dL — AB (ref 65–99)

## 2018-02-13 NOTE — Progress Notes (Signed)
Patient ID: [name], male    DOB: May 14, 1946, 72 y.o.   MRN: [id number]  HPI  [name] is a 72 y/o male with a history of prostate cancer, DM, hyperlipidemia, HTN, anemia, COPD, GERD, MI, previous tobacco use and chronic heart failure.   Echo report from 12/05/17 reviewed and showed an EF of 20-25% along with mild MR and moderate MR. Cardiac catheterization done 02/02/16 showed severed 3-vessel CAD with patent grafts. Continue medication management along with dual antiplatelet therapy.   Was in the ED 02/12/18 due to HF exacerbation. He had missed his most recent HF Clinic appointment. Given one dose of IV lasix and released. Admitted 12/30/17 due to HF exacerbation. Initially needed IV lasix and then transitioned to oral diuretics. Needed bipap initially as well. Discharged after 2 days. Admitted 12/05/17 due to HF exacerbation. Cardiology consult obtained. Elevated troponins thought to be due to demand ischemia. Discharged the following day. Was in the ED 12/04/17 with chest pain where he was evaluated and released. Was in the ED 11/22/17 due to gout where he was treated and released.   He presents today for a follow-up visit with a chief complaint of minimal shortness of breath upon moderate exertion. He describes this as chronic in nature having been present for several years with varying levels of severity. He has associated fatigue and gradual weight gain along with this. He denies any difficulty sleeping, abdominal distention, palpitations, edema, chest pain, cough or dizziness. Recently in the ED and was given IV lasix.   Past Medical History:  Diagnosis Date  . Anemia   . Cancer ([place]) 12/2013   prostate  . Cardiogenic pulmonary edema ([place]) 12/19/2014  . Cardiomyopathy, ischemic   . CHF (congestive heart failure) ([place])   . COPD (chronic obstructive pulmonary disease) ([place])   . Diabetes mellitus without complication ([place])   . GERD (gastroesophageal reflux disease)   .  Hypercholesteremia   . Hypertension   . Myocardial infarction ([place]) [id number]  . Shortness of breath dyspnea    Past Surgical History:  Procedure Laterality Date  . CARDIAC CATHETERIZATION N/A 02/02/2016   Procedure: Left Heart Cath and Coronary Angiography;  Surgeon: [name], MD;  Location: [place] [facility];  Service: Cardiovascular;  Laterality: N/A;  . CORONARY ANGIOPLASTY WITH STENT PLACEMENT    . CORONARY ARTERY BYPASS GRAFT  11/24/2010  . IMPLANTABLE CARDIOVERTER DEFIBRILLATOR (ICD) GENERATOR CHANGE Left 12/12/2015   Procedure: DUAL LEAD PLACEMENT CARDIAC DIFIBRILLATOR;  Surgeon: [name], MD;  Location: [facility];  Service: Cardiovascular;  Laterality: Left;  . TONSILLECTOMY     Family History  Problem Relation Age of Onset  . CAD Mother   . Cancer Mother   . Diabetes Mother   . Alzheimer's disease Father   . Cancer Father   . Heart disease Father    Social History   Tobacco Use  . Smoking status: Former Smoker    Packs/day: 2.00    Years: 30.00    Pack years: 60.00    Last attempt to quit: 12/03/1996    Years since quitting: 21.2  . Smokeless tobacco: Never Used  Substance Use Topics  . Alcohol use: No    Alcohol/week: 0.0 oz   Allergies  Allergen Reactions  . Benadryl [Diphenhydramine] Other (See Comments)    " Hyperactivity"  . Doxycycline Swelling    Pt went into pulmonary edema.  . Lopid [Gemfibrozil] Swelling    "I gain 1 pound a day for  30 days."   Prior to Admission medications   Medication Sig Start Date End Date Taking? Authorizing Provider  albuterol (PROVENTIL HFA;VENTOLIN HFA) 108 (90 Base) MCG/ACT inhaler Inhale 2 puffs into the lungs 4 (four) times daily as needed for wheezing or shortness of breath.    Yes [provider]  albuterol-ipratropium (COMBIVENT) 18-103 MCG/ACT inhaler Inhale 1 puff into the lungs 2 times daily at 12 noon and 4 pm.   Yes [provider]  allopurinol (ZYLOPRIM) 100 MG tablet Take  100 mg by mouth daily.   Yes [provider]  atorvastatin (LIPITOR) 80 MG tablet Take 1 tablet (80 mg total) by mouth every evening. 04/21/17  Yes Theodoro [name], MD  budesonide-formoterol (SYMBICORT) 80-4.5 MCG/ACT inhaler Inhale 2 puffs into the lungs 2 (two) times daily.   Yes [provider]  calcium carbonate (TUMS - DOSED IN MG ELEMENTAL CALCIUM) 500 MG chewable tablet Chew 1 tablet by mouth as needed for indigestion or heartburn.   Yes [provider]  carvedilol (COREG) 3.125 MG tablet Take 1 tablet (3.125 mg total) by mouth 2 (two) times daily with a meal. 01/01/18  Yes Gladstone [name], MD  citalopram (CELEXA) 20 MG tablet Take 1 tablet by mouth 2 (two) times daily.   Yes [provider]  clopidogrel (PLAVIX) 75 MG tablet Take 75 mg by mouth every morning.   Yes [provider]  co-enzyme Q-10 30 MG capsule Take 30 mg by mouth daily.   Yes [provider]  ferrous sulfate 325 (65 FE) MG tablet Take 325 mg by mouth 2 (two) times daily with a meal.   Yes [provider]  finasteride (PROSCAR) 5 MG tablet Take 5 mg by mouth at bedtime.   Yes [provider]  FLUoxetine (PROZAC) 20 MG capsule Take 20 mg by mouth daily.   Yes [provider]  gabapentin (NEURONTIN) 300 MG capsule Take 300 mg by mouth 2 (two) times daily.    Yes [provider]  insulin glargine (LANTUS) 100 UNIT/ML injection Inject 0.6 mLs (60 Units total) into the skin at bedtime. 01/01/18  Yes Gladstone [name], MD  insulin lispro (HUMALOG) 100 UNIT/ML KiwkPen Inject 0.18 Units into the skin 3 (three) times daily with meals. Per sliding scale   Yes [provider]  loratadine (CLARITIN) 10 MG tablet Take 10 mg by mouth daily.   Yes [provider]  Multiple Vitamins-Minerals (CENTRUM SILVER PO) Take 1 tablet by mouth every morning.   Yes [provider]  nitroGLYCERIN (NITROSTAT) 0.4 MG SL tablet Place 0.4 mg  under the tongue every 5 (five) minutes as needed for chest pain.   Yes [provider]  pantoprazole (PROTONIX) 40 MG tablet Take 80 mg by mouth 2 (two) times daily.    Yes [provider]  ranolazine (RANEXA) 500 MG 12 hr tablet Take 500 mg by mouth 2 (two) times daily.   Yes [provider]  spironolactone (ALDACTONE) 25 MG tablet Take 12.5 mg by mouth daily.   Yes [provider]  tamsulosin (FLOMAX) 0.4 MG CAPS capsule Take 0.4 mg by mouth daily.   Yes [provider]  torsemide (DEMADEX) 20 MG tablet Take 40 mg by mouth daily.   Yes [provider]    Review of Systems  Constitutional: Positive for fatigue. Negative for appetite change.  HENT: Negative for congestion, postnasal drip and sore throat.   Eyes: Negative.   Respiratory: Positive for shortness of breath.  Negative for cough and chest tightness.   Cardiovascular: Negative for chest pain, palpitations and leg swelling.  Gastrointestinal: Negative for abdominal distention and abdominal pain.  Endocrine: Negative.   Genitourinary: Negative.   Musculoskeletal: Negative.   Skin: Negative.   Allergic/Immunologic: Negative.   Neurological: Negative for dizziness and light-headedness.  Hematological: Negative for adenopathy. Bruises/bleeds easily.  Psychiatric/Behavioral: Negative for dysphoric mood and sleep disturbance (sleeping well with CPAP and oxygen). The patient is not nervous/anxious.    Vitals:   02/13/18 1348  BP: (!) 108/47  Pulse: 87  Resp: 18  SpO2: 99%  Weight: 251 lb 8 oz (114.1 kg)  Height: 5\' 8"  (1.727 m)   Wt Readings from Last 3 Encounters:  02/13/18 251 lb 8 oz (114.1 kg)  02/12/18 254 lb (115.2 kg)  01/05/18 245 lb 4 oz (111.2 kg)   Lab Results  Component Value Date   CREATININE 1.07 02/12/2018   CREATININE 1.20 01/01/2018   CREATININE 1.16 12/31/2017   Physical Exam  Constitutional: He is oriented to person, place, and time. He appears  well-developed and well-nourished.  HENT:  Head: Normocephalic and atraumatic.  Neck: Normal range of motion. Neck supple. No JVD present.  Cardiovascular: Normal rate and regular rhythm.  Pulmonary/Chest: Effort normal. He has no wheezes. He has no rales.  Abdominal: He exhibits distension. There is no tenderness.  Musculoskeletal: He exhibits no edema or tenderness.  Neurological: He is alert and oriented to person, place, and time.  Skin: Skin is warm and dry.  Psychiatric: He has a normal mood and affect. His behavior is normal. Thought content normal.  Nursing note and vitals reviewed.  Assessment & Plan:  1: Chronic heart failure with reduced ejection fraction-  - NYHA class II - mildly fluid overloaded today with abdominal distention and weight gain - he hasn't been weighing daily but says that his weight has gradually risen over the last few weeks. Encouraged to resume weighing daily and to call for an overnight weight gain of >2 pounds or a weekly weight gain of >5 pounds - weight up 6 pounds since 01/05/18 - not adding salt but does eat fast food often. Discussed the importance of closely following a 2000mg  sodium diet and to limit fast food - says that he usually eats breakfast out and ate gravy biscuits this morning - saw cardiology Nehemiah Massed) 01/05/18 & returns 03/06/18 - BNP from 12/30/17 was 336.0 - was instructed by ED to take a dose of furosemide tonight  - discussed that we could give IV furosemide at same day surgery if needed for the future  2: HTN- - BP looks good today - saw PCP (T[facility]) 01/30/18 & returns to [name] 03/07/18 - BMP from 02/12/18 reviewed and showed sodium 141, potassium 3.5 and GFR >60   3: Obstructive sleep apnea-   - wearing CPAP nightly along with oxygen at 5L - reports sleeping well  4: Diabetes-  - nonfasting glucose in clinic was 137  - says that he only checks his glucose when he doesn't feel well. Explained the importance of checking it  daily even if he feels ok - A1c on 12/05/17 was 5.9%  Patient did not bring his medications nor a list.   Return in 1 week for a recheck or sooner for any questions/problems before then.

## 2018-02-13 NOTE — Patient Instructions (Addendum)
Resume weighing daily and call for an overnight weight gain of > 2 pounds or a weekly weight gain of >5 pounds. 

## 2018-02-16 DIAGNOSIS — G4733 Obstructive sleep apnea (adult) (pediatric): Secondary | ICD-10-CM | POA: Diagnosis not present

## 2018-02-16 DIAGNOSIS — M7052 Other bursitis of knee, left knee: Secondary | ICD-10-CM | POA: Diagnosis not present

## 2018-02-16 DIAGNOSIS — I5022 Chronic systolic (congestive) heart failure: Secondary | ICD-10-CM | POA: Diagnosis not present

## 2018-02-18 ENCOUNTER — Observation Stay
Admit: 2018-02-18 | Discharge: 2018-02-18 | Disposition: A | Discharge status: Home / Self Care | Payer: PPO | Attending: Internal Medicine | Admitting: Internal Medicine

## 2018-02-18 ENCOUNTER — Emergency Department: Payer: PPO

## 2018-02-18 ENCOUNTER — Encounter: Payer: Self-pay | Admitting: Emergency Medicine

## 2018-02-18 ENCOUNTER — Other Ambulatory Visit: Payer: Self-pay

## 2018-02-18 ENCOUNTER — Observation Stay
Admission: EM | Admit: 2018-02-18 | Discharge: 2018-02-19 | Disposition: A | Discharge status: Home / Self Care | Payer: PPO | Attending: Internal Medicine | Admitting: Internal Medicine

## 2018-02-18 DIAGNOSIS — Z888 Allergy status to other drugs, medicaments and biological substances status: Secondary | ICD-10-CM | POA: Insufficient documentation

## 2018-02-18 DIAGNOSIS — I420 Dilated cardiomyopathy: Secondary | ICD-10-CM | POA: Insufficient documentation

## 2018-02-18 DIAGNOSIS — I130 Hypertensive heart and chronic kidney disease with heart failure and stage 1 through stage 4 chronic kidney disease, or unspecified chronic kidney disease: Secondary | ICD-10-CM | POA: Diagnosis not present

## 2018-02-18 DIAGNOSIS — Z9989 Dependence on other enabling machines and devices: Secondary | ICD-10-CM | POA: Insufficient documentation

## 2018-02-18 DIAGNOSIS — I255 Ischemic cardiomyopathy: Secondary | ICD-10-CM | POA: Diagnosis not present

## 2018-02-18 DIAGNOSIS — J9601 Acute respiratory failure with hypoxia: Secondary | ICD-10-CM | POA: Diagnosis not present

## 2018-02-18 DIAGNOSIS — E119 Type 2 diabetes mellitus without complications: Secondary | ICD-10-CM | POA: Diagnosis not present

## 2018-02-18 DIAGNOSIS — G4733 Obstructive sleep apnea (adult) (pediatric): Secondary | ICD-10-CM | POA: Diagnosis not present

## 2018-02-18 DIAGNOSIS — I5023 Acute on chronic systolic (congestive) heart failure: Secondary | ICD-10-CM | POA: Insufficient documentation

## 2018-02-18 DIAGNOSIS — E7800 Pure hypercholesterolemia, unspecified: Secondary | ICD-10-CM | POA: Insufficient documentation

## 2018-02-18 DIAGNOSIS — R0602 Shortness of breath: Secondary | ICD-10-CM | POA: Diagnosis not present

## 2018-02-18 DIAGNOSIS — Z95810 Presence of automatic (implantable) cardiac defibrillator: Secondary | ICD-10-CM | POA: Insufficient documentation

## 2018-02-18 DIAGNOSIS — I252 Old myocardial infarction: Secondary | ICD-10-CM | POA: Insufficient documentation

## 2018-02-18 DIAGNOSIS — I25119 Atherosclerotic heart disease of native coronary artery with unspecified angina pectoris: Secondary | ICD-10-CM

## 2018-02-18 DIAGNOSIS — N183 Chronic kidney disease, stage 3 (moderate): Secondary | ICD-10-CM | POA: Insufficient documentation

## 2018-02-18 DIAGNOSIS — Z8249 Family history of ischemic heart disease and other diseases of the circulatory system: Secondary | ICD-10-CM | POA: Insufficient documentation

## 2018-02-18 DIAGNOSIS — Z794 Long term (current) use of insulin: Secondary | ICD-10-CM | POA: Insufficient documentation

## 2018-02-18 DIAGNOSIS — Z881 Allergy status to other antibiotic agents status: Secondary | ICD-10-CM | POA: Insufficient documentation

## 2018-02-18 DIAGNOSIS — Z9981 Dependence on supplemental oxygen: Secondary | ICD-10-CM | POA: Insufficient documentation

## 2018-02-18 DIAGNOSIS — Z8546 Personal history of malignant neoplasm of prostate: Secondary | ICD-10-CM | POA: Diagnosis not present

## 2018-02-18 DIAGNOSIS — J9600 Acute respiratory failure, unspecified whether with hypoxia or hypercapnia: Secondary | ICD-10-CM

## 2018-02-18 DIAGNOSIS — E1122 Type 2 diabetes mellitus with diabetic chronic kidney disease: Secondary | ICD-10-CM | POA: Insufficient documentation

## 2018-02-18 DIAGNOSIS — Z87891 Personal history of nicotine dependence: Secondary | ICD-10-CM | POA: Insufficient documentation

## 2018-02-18 DIAGNOSIS — I509 Heart failure, unspecified: Secondary | ICD-10-CM | POA: Diagnosis not present

## 2018-02-18 DIAGNOSIS — I472 Ventricular tachycardia: Secondary | ICD-10-CM | POA: Diagnosis not present

## 2018-02-18 DIAGNOSIS — I340 Nonrheumatic mitral (valve) insufficiency: Secondary | ICD-10-CM | POA: Diagnosis not present

## 2018-02-18 DIAGNOSIS — I2510 Atherosclerotic heart disease of native coronary artery without angina pectoris: Secondary | ICD-10-CM | POA: Insufficient documentation

## 2018-02-18 DIAGNOSIS — I5022 Chronic systolic (congestive) heart failure: Secondary | ICD-10-CM

## 2018-02-18 DIAGNOSIS — I25810 Atherosclerosis of coronary artery bypass graft(s) without angina pectoris: Secondary | ICD-10-CM | POA: Diagnosis not present

## 2018-02-18 DIAGNOSIS — Z7902 Long term (current) use of antithrombotics/antiplatelets: Secondary | ICD-10-CM | POA: Diagnosis not present

## 2018-02-18 DIAGNOSIS — Z79899 Other long term (current) drug therapy: Secondary | ICD-10-CM | POA: Diagnosis not present

## 2018-02-18 DIAGNOSIS — I110 Hypertensive heart disease with heart failure: Secondary | ICD-10-CM | POA: Diagnosis not present

## 2018-02-18 DIAGNOSIS — Z951 Presence of aortocoronary bypass graft: Secondary | ICD-10-CM | POA: Insufficient documentation

## 2018-02-18 DIAGNOSIS — Z955 Presence of coronary angioplasty implant and graft: Secondary | ICD-10-CM | POA: Insufficient documentation

## 2018-02-18 DIAGNOSIS — Z9889 Other specified postprocedural states: Secondary | ICD-10-CM | POA: Insufficient documentation

## 2018-02-18 DIAGNOSIS — J441 Chronic obstructive pulmonary disease with (acute) exacerbation: Secondary | ICD-10-CM | POA: Diagnosis not present

## 2018-02-18 DIAGNOSIS — K219 Gastro-esophageal reflux disease without esophagitis: Secondary | ICD-10-CM | POA: Diagnosis not present

## 2018-02-18 LAB — CBC WITH DIFFERENTIAL/PLATELET
BASOS PCT: 0 %
Basophils Absolute: 0 10*3/uL (ref 0–0.1)
EOS ABS: 0.1 10*3/uL (ref 0–0.7)
Eosinophils Relative: 1 %
HCT: 37.3 % — ABNORMAL LOW (ref 40.0–52.0)
HEMOGLOBIN: 12 g/dL — AB (ref 13.0–18.0)
Lymphocytes Relative: 10 %
Lymphs Abs: 1.5 10*3/uL (ref 1.0–3.6)
MCH: 29.4 pg (ref 26.0–34.0)
MCHC: 32.3 g/dL (ref 32.0–36.0)
MCV: 91.1 fL (ref 80.0–100.0)
MONO ABS: 0.7 10*3/uL (ref 0.2–1.0)
MONOS PCT: 5 %
NEUTROS PCT: 84 %
Neutro Abs: 12.7 10*3/uL — ABNORMAL HIGH (ref 1.4–6.5)
Platelets: 260 10*3/uL (ref 150–440)
RBC: 4.09 MIL/uL — ABNORMAL LOW (ref 4.40–5.90)
RDW: 15.8 % — ABNORMAL HIGH (ref 11.5–14.5)
WBC: 15 10*3/uL — ABNORMAL HIGH (ref 3.8–10.6)

## 2018-02-18 LAB — TROPONIN I
Troponin I: 0.05 ng/mL (ref <0.03)
Troponin I: 0.06 ng/mL (ref <0.03)
Troponin I: 0.06 ng/mL (ref <0.03)

## 2018-02-18 LAB — COMPREHENSIVE METABOLIC PANEL
ALBUMIN: 3.9 g/dL (ref 3.5–5.0)
ALT: 21 U/L (ref 17–63)
ANION GAP: 12 (ref 5–15)
AST: 25 U/L (ref 15–41)
Alkaline Phosphatase: 68 U/L (ref 38–126)
BUN: 19 mg/dL (ref 6–20)
CO2: 26 mmol/L (ref 22–32)
Calcium: 8.6 mg/dL — ABNORMAL LOW (ref 8.9–10.3)
Chloride: 103 mmol/L (ref 101–111)
Creatinine, Ser: 1.24 mg/dL (ref 0.61–1.24)
GFR calc Af Amer: >60 mL/min (ref >60)
GFR calc non Af Amer: 56 mL/min — ABNORMAL LOW (ref >60)
GLUCOSE: 156 mg/dL — AB (ref 65–99)
POTASSIUM: 3.7 mmol/L (ref 3.5–5.1)
SODIUM: 141 mmol/L (ref 135–145)
Total Bilirubin: 0.8 mg/dL (ref 0.3–1.2)
Total Protein: 7.9 g/dL (ref 6.5–8.1)

## 2018-02-18 LAB — INFLUENZA PANEL BY PCR (TYPE A & B)
INFLBPCR: NEGATIVE
Influenza A By PCR: NEGATIVE

## 2018-02-18 LAB — GLUCOSE, CAPILLARY
Glucose-Capillary: 164 mg/dL — ABNORMAL HIGH (ref 65–99)
Glucose-Capillary: 276 mg/dL — ABNORMAL HIGH (ref 65–99)
Glucose-Capillary: 185 mg/dL — ABNORMAL HIGH (ref 65–99)

## 2018-02-18 LAB — ECHOCARDIOGRAM COMPLETE
HEIGHTINCHES: 68 in
WEIGHTICAEL: 4016 [oz_av]

## 2018-02-18 LAB — BRAIN NATRIURETIC PEPTIDE: B NATRIURETIC PEPTIDE 5: 805 pg/mL — AB (ref 0.0–100.0)

## 2018-02-18 MED ORDER — ALLOPURINOL 100 MG PO TABS
100.0000 mg | ORAL_TABLET | Freq: Every day | ORAL | Status: DC
  Administered 2018-02-18 – 2018-02-19 (×2): 100 mg via ORAL
  Filled 2018-02-18 (×2): qty 1

## 2018-02-18 MED ORDER — PANTOPRAZOLE SODIUM 40 MG IV SOLR
40.0000 mg | Freq: Two times a day (BID) | INTRAVENOUS | Status: DC
  Administered 2018-02-18 – 2018-02-19 (×3): 40 mg via INTRAVENOUS
  Filled 2018-02-18 (×4): qty 40

## 2018-02-18 MED ORDER — ALBUTEROL SULFATE HFA 108 (90 BASE) MCG/ACT IN AERS
2.0000 | INHALATION_SPRAY | Freq: Four times a day (QID) | RESPIRATORY_TRACT | Status: DC | PRN
  Filled 2018-02-18: qty 6.7

## 2018-02-18 MED ORDER — TAMSULOSIN HCL 0.4 MG PO CAPS
0.4000 mg | ORAL_CAPSULE | Freq: Every day | ORAL | Status: DC
  Administered 2018-02-18 – 2018-02-19 (×2): 0.4 mg via ORAL
  Filled 2018-02-18 (×2): qty 1

## 2018-02-18 MED ORDER — DOCUSATE SODIUM 100 MG PO CAPS
100.0000 mg | ORAL_CAPSULE | Freq: Two times a day (BID) | ORAL | Status: DC
  Administered 2018-02-18 – 2018-02-19 (×2): 100 mg via ORAL
  Filled 2018-02-18 (×3): qty 1

## 2018-02-18 MED ORDER — ATORVASTATIN CALCIUM 20 MG PO TABS
80.0000 mg | ORAL_TABLET | Freq: Every evening | ORAL | Status: DC
  Administered 2018-02-18: 80 mg via ORAL
  Filled 2018-02-18: qty 4

## 2018-02-18 MED ORDER — SODIUM CHLORIDE 0.9% FLUSH
3.0000 mL | Freq: Two times a day (BID) | INTRAVENOUS | Status: DC
  Administered 2018-02-18: 10 mL via INTRAVENOUS
  Administered 2018-02-18 – 2018-02-19 (×2): 3 mL via INTRAVENOUS

## 2018-02-18 MED ORDER — IPRATROPIUM-ALBUTEROL 0.5-2.5 (3) MG/3ML IN SOLN
3.0000 mL | Freq: Four times a day (QID) | RESPIRATORY_TRACT | Status: DC
  Administered 2018-02-18 – 2018-02-19 (×3): 3 mL via RESPIRATORY_TRACT
  Filled 2018-02-18 (×3): qty 3

## 2018-02-18 MED ORDER — NITROGLYCERIN 0.4 MG SL SUBL: 0.4000 mg | SUBLINGUAL_TABLET | SUBLINGUAL | Status: DC | PRN

## 2018-02-18 MED ORDER — MORPHINE SULFATE (PF) 2 MG/ML IV SOLN: 2.0000 mg | INTRAVENOUS | Status: DC | PRN

## 2018-02-18 MED ORDER — CLOPIDOGREL BISULFATE 75 MG PO TABS
75.0000 mg | ORAL_TABLET | ORAL | Status: DC
  Administered 2018-02-18 – 2018-02-19 (×2): 75 mg via ORAL
  Filled 2018-02-18 (×3): qty 1

## 2018-02-18 MED ORDER — MOMETASONE FURO-FORMOTEROL FUM 100-5 MCG/ACT IN AERO
2.0000 | INHALATION_SPRAY | Freq: Two times a day (BID) | RESPIRATORY_TRACT | Status: DC
  Administered 2018-02-18 – 2018-02-19 (×2): 2 via RESPIRATORY_TRACT
  Filled 2018-02-18: qty 8.8

## 2018-02-18 MED ORDER — CLOPIDOGREL BISULFATE 75 MG PO TABS: 75.0000 mg | ORAL_TABLET | ORAL | Status: DC

## 2018-02-18 MED ORDER — INSULIN GLARGINE 100 UNIT/ML ~~LOC~~ SOLN
60.0000 [IU] | Freq: Every day | SUBCUTANEOUS | Status: DC
  Administered 2018-02-18: 60 [IU] via SUBCUTANEOUS
  Filled 2018-02-18 (×2): qty 0.6

## 2018-02-18 MED ORDER — FINASTERIDE 5 MG PO TABS
5.0000 mg | ORAL_TABLET | Freq: Every day | ORAL | Status: DC
  Administered 2018-02-18: 5 mg via ORAL
  Filled 2018-02-18: qty 1

## 2018-02-18 MED ORDER — FLUOXETINE HCL 20 MG PO CAPS
20.0000 mg | ORAL_CAPSULE | Freq: Every day | ORAL | Status: DC
  Administered 2018-02-19: 20 mg via ORAL
  Filled 2018-02-18 (×2): qty 1

## 2018-02-18 MED ORDER — INSULIN ASPART 100 UNIT/ML ~~LOC~~ SOLN
0.0000 [IU] | Freq: Three times a day (TID) | SUBCUTANEOUS | Status: DC
  Administered 2018-02-18: 5 [IU] via SUBCUTANEOUS
  Administered 2018-02-18: 2 [IU] via SUBCUTANEOUS
  Administered 2018-02-19: 3 [IU] via SUBCUTANEOUS
  Administered 2018-02-19: 2 [IU] via SUBCUTANEOUS
  Filled 2018-02-18 (×4): qty 1

## 2018-02-18 MED ORDER — INSULIN GLARGINE 100 UNIT/ML ~~LOC~~ SOLN
60.0000 [IU] | Freq: Every day | SUBCUTANEOUS | Status: DC
Start: 2018-02-19 — End: 2018-02-18

## 2018-02-18 MED ORDER — IPRATROPIUM-ALBUTEROL 0.5-2.5 (3) MG/3ML IN SOLN
3.0000 mL | Freq: Once | RESPIRATORY_TRACT | Status: AC
  Administered 2018-02-18: 3 mL via RESPIRATORY_TRACT

## 2018-02-18 MED ORDER — BISACODYL 10 MG RE SUPP
10.0000 mg | Freq: Every day | RECTAL | Status: DC | PRN
Start: 2018-02-18 — End: 2018-02-19

## 2018-02-18 MED ORDER — METHYLPREDNISOLONE SODIUM SUCC 125 MG IJ SOLR
INTRAMUSCULAR | Status: AC
  Filled 2018-02-18: qty 2

## 2018-02-18 MED ORDER — ONDANSETRON HCL 4 MG/2ML IJ SOLN: 4.0000 mg | Freq: Four times a day (QID) | INTRAMUSCULAR | Status: DC | PRN

## 2018-02-18 MED ORDER — PERFLUTREN LIPID MICROSPHERE
1.0000 mL | INTRAVENOUS | Status: AC | PRN
  Administered 2018-02-18: 9 mL via INTRAVENOUS
  Filled 2018-02-18: qty 10

## 2018-02-18 MED ORDER — ACETAMINOPHEN 650 MG RE SUPP: 650.0000 mg | Freq: Four times a day (QID) | RECTAL | Status: DC | PRN

## 2018-02-18 MED ORDER — ACETAMINOPHEN 325 MG PO TABS: 650.0000 mg | ORAL_TABLET | Freq: Four times a day (QID) | ORAL | Status: DC | PRN

## 2018-02-18 MED ORDER — SODIUM CHLORIDE 0.9% FLUSH: 3.0000 mL | INTRAVENOUS | Status: DC | PRN

## 2018-02-18 MED ORDER — IPRATROPIUM-ALBUTEROL 0.5-2.5 (3) MG/3ML IN SOLN
3.0000 mL | Freq: Once | RESPIRATORY_TRACT | Status: AC
  Administered 2018-02-18: 3 mL via RESPIRATORY_TRACT
  Filled 2018-02-18: qty 6

## 2018-02-18 MED ORDER — LORATADINE 10 MG PO TABS
10.0000 mg | ORAL_TABLET | Freq: Every day | ORAL | Status: DC
  Administered 2018-02-18 – 2018-02-19 (×2): 10 mg via ORAL
  Filled 2018-02-18 (×2): qty 1

## 2018-02-18 MED ORDER — SPIRONOLACTONE 12.5 MG HALF TABLET
12.5000 mg | ORAL_TABLET | Freq: Every day | ORAL | Status: DC
  Administered 2018-02-18 – 2018-02-19 (×2): 12.5 mg via ORAL
  Filled 2018-02-18 (×2): qty 1

## 2018-02-18 MED ORDER — SODIUM CHLORIDE 0.9 % IV SOLN: 250.0000 mL | INTRAVENOUS | Status: DC | PRN

## 2018-02-18 MED ORDER — ENOXAPARIN SODIUM 40 MG/0.4ML ~~LOC~~ SOLN
40.0000 mg | SUBCUTANEOUS | Status: DC
  Administered 2018-02-18: 40 mg via SUBCUTANEOUS
  Filled 2018-02-18: qty 0.4

## 2018-02-18 MED ORDER — CARVEDILOL 3.125 MG PO TABS
3.1250 mg | ORAL_TABLET | Freq: Two times a day (BID) | ORAL | Status: DC
  Administered 2018-02-18 – 2018-02-19 (×2): 3.125 mg via ORAL
  Filled 2018-02-18 (×2): qty 1

## 2018-02-18 MED ORDER — RANOLAZINE ER 500 MG PO TB12
500.0000 mg | ORAL_TABLET | Freq: Two times a day (BID) | ORAL | Status: DC
  Administered 2018-02-18 – 2018-02-19 (×3): 500 mg via ORAL
  Filled 2018-02-18 (×4): qty 1

## 2018-02-18 MED ORDER — FERROUS SULFATE 325 (65 FE) MG PO TABS
325.0000 mg | ORAL_TABLET | Freq: Two times a day (BID) | ORAL | Status: DC
  Administered 2018-02-18 – 2018-02-19 (×2): 325 mg via ORAL
  Filled 2018-02-18 (×2): qty 1

## 2018-02-18 MED ORDER — FLUOXETINE HCL 20 MG PO CAPS: 20.0000 mg | ORAL_CAPSULE | Freq: Every day | ORAL | Status: DC

## 2018-02-18 MED ORDER — CITALOPRAM HYDROBROMIDE 20 MG PO TABS
20.0000 mg | ORAL_TABLET | Freq: Two times a day (BID) | ORAL | Status: DC
  Administered 2018-02-18 – 2018-02-19 (×3): 20 mg via ORAL
  Filled 2018-02-18 (×3): qty 1

## 2018-02-18 MED ORDER — POTASSIUM CHLORIDE CRYS ER 20 MEQ PO TBCR
20.0000 meq | EXTENDED_RELEASE_TABLET | Freq: Two times a day (BID) | ORAL | Status: DC
  Administered 2018-02-18 – 2018-02-19 (×3): 20 meq via ORAL
  Filled 2018-02-18 (×3): qty 1

## 2018-02-18 MED ORDER — GABAPENTIN 300 MG PO CAPS
300.0000 mg | ORAL_CAPSULE | Freq: Two times a day (BID) | ORAL | Status: DC
  Administered 2018-02-18 – 2018-02-19 (×2): 300 mg via ORAL
  Filled 2018-02-18 (×2): qty 1

## 2018-02-18 MED ORDER — FUROSEMIDE 10 MG/ML IJ SOLN
40.0000 mg | Freq: Two times a day (BID) | INTRAMUSCULAR | Status: DC
  Administered 2018-02-18: 40 mg via INTRAVENOUS
  Filled 2018-02-18 (×2): qty 4

## 2018-02-18 MED ORDER — FUROSEMIDE 10 MG/ML IJ SOLN
40.0000 mg | Freq: Once | INTRAMUSCULAR | Status: AC
  Administered 2018-02-18: 40 mg via INTRAVENOUS
  Filled 2018-02-18: qty 4

## 2018-02-18 MED ORDER — NITROGLYCERIN 2 % TD OINT
1.0000 [in_us] | TOPICAL_OINTMENT | Freq: Four times a day (QID) | TRANSDERMAL | Status: DC
  Administered 2018-02-18 (×3): 1 [in_us] via TOPICAL
  Filled 2018-02-18 (×5): qty 1

## 2018-02-18 MED ORDER — ONDANSETRON HCL 4 MG PO TABS: 4.0000 mg | ORAL_TABLET | Freq: Four times a day (QID) | ORAL | Status: DC | PRN

## 2018-02-18 MED ORDER — METHYLPREDNISOLONE SODIUM SUCC 125 MG IJ SOLR
125.0000 mg | Freq: Once | INTRAMUSCULAR | Status: AC
  Administered 2018-02-18: 125 mg via INTRAVENOUS

## 2018-02-18 NOTE — ED Notes (Signed)
Pt sleeping. Breathing no longer labored.

## 2018-02-18 NOTE — ED Provider Notes (Signed)
[facility] Emergency Department Provider Note ____________________________________________   I have reviewed the triage vital signs and the triage nursing note.  HISTORY  Chief Complaint Respiratory Distress   Historian Patient  HPI [name] is a 72 y.o. male with history of COPD as well as CHF, recently treated this past week for CHF exacerbation with a dose of IV Lasix in the ED and then oral diuretics at home.  He is also had a fairly recent hospital admission in January for a heart failure exacerbation.  Shortness of breath became worse around 2 AM this morning.  He took several albuterol treatments, and had continued worsening and so he called 911.  Patient apparently had rhonchi as well as wheezing and was placed on BiPAP on arrival.  Here the patient is on BiPAP but states that his breathing already feels much better.  He is not reporting any lower extremity edema.  No fever.  Not reporting any productive cough.  He is not sure whether or not this is congestive heart failure or COPD exacerbation.     Past Medical History:  Diagnosis Date  . Anemia   . Cancer ([place]) 12/2013   prostate  . Cardiogenic pulmonary edema ([place]) 12/19/2014  . Cardiomyopathy, ischemic   . CHF (congestive heart failure) ([place])   . COPD (chronic obstructive pulmonary disease) ([place])   . Diabetes mellitus without complication ([place])   . GERD (gastroesophageal reflux disease)   . Hypercholesteremia   . Hypertension   . Myocardial infarction ([place]) [id number]  . Shortness of breath dyspnea     Patient Active Problem List   Diagnosis Date Noted  . Acute respiratory failure ([place]) 02/18/2018  . CHF (congestive heart failure) ([place]) 02/18/2018  . Acute on chronic systolic CHF (congestive heart failure) ([place]) 04/21/2017  . Hypokalemia 04/21/2017  . Hypomagnesemia 04/21/2017  . Essential hypertension 04/21/2017  . Hyperlipidemia 04/21/2017  . Coronary artery  disease 04/21/2017  . COPD with chronic bronchitis ([place]) 02/13/2016  . Obstructive sleep apnea 02/13/2016  . Diabetes ([place]) 02/13/2016  . Chronic systolic HF (heart failure) ([place]) 12/12/2015    Past Surgical History:  Procedure Laterality Date  . CARDIAC CATHETERIZATION N/A 02/02/2016   Procedure: Left Heart Cath and Coronary Angiography;  Surgeon: [name], MD;  Location: [place] [facility];  Service: Cardiovascular;  Laterality: N/A;  . CORONARY ANGIOPLASTY WITH STENT PLACEMENT    . CORONARY ARTERY BYPASS GRAFT  11/24/2010  . IMPLANTABLE CARDIOVERTER DEFIBRILLATOR (ICD) GENERATOR CHANGE Left 12/12/2015   Procedure: DUAL LEAD PLACEMENT CARDIAC DIFIBRILLATOR;  Surgeon: [name], MD;  Location: [facility];  Service: Cardiovascular;  Laterality: Left;  . TONSILLECTOMY      Prior to Admission medications   Medication Sig Start Date End Date Taking? Authorizing Provider  allopurinol (ZYLOPRIM) 100 MG tablet Take 100 mg by mouth daily.   Yes [provider]  atorvastatin (LIPITOR) 80 MG tablet Take 1 tablet (80 mg total) by mouth every evening. 04/21/17  Yes Theodoro [name], MD  budesonide-formoterol (SYMBICORT) 80-4.5 MCG/ACT inhaler Inhale 2 puffs into the lungs 2 (two) times daily.   Yes [provider]  carvedilol (COREG) 3.125 MG tablet Take 1 tablet (3.125 mg total) by mouth 2 (two) times daily with a meal. 01/01/18  Yes Gladstone [name], MD  citalopram (CELEXA) 20 MG tablet Take 1 tablet by mouth 2 (two) times daily.   Yes [provider]  clopidogrel (PLAVIX) 75 MG tablet Take 75 mg by mouth  every morning.   Yes [provider]  co-enzyme Q-10 30 MG capsule Take 30 mg by mouth daily.   Yes [provider]  ferrous sulfate 325 (65 FE) MG tablet Take 325 mg by mouth 2 (two) times daily with a meal.   Yes [provider]  finasteride (PROSCAR) 5 MG tablet Take 5 mg by mouth at bedtime.   Yes [provider]   FLUoxetine (PROZAC) 20 MG capsule Take 20 mg by mouth daily.   Yes [provider]  gabapentin (NEURONTIN) 300 MG capsule Take 300 mg by mouth 2 (two) times daily.    Yes [provider]  insulin glargine (LANTUS) 100 UNIT/ML injection Inject 0.6 mLs (60 Units total) into the skin at bedtime. 01/01/18  Yes Gladstone [name], MD  insulin lispro (HUMALOG) 100 UNIT/ML KiwkPen Inject 0-18 Units into the skin 3 (three) times daily with meals. Per sliding scale   Yes [provider]  loratadine (CLARITIN) 10 MG tablet Take 10 mg by mouth daily.   Yes [provider]  Multiple Vitamins-Minerals (CENTRUM SILVER PO) Take 1 tablet by mouth every morning.   Yes [provider]  pantoprazole (PROTONIX) 40 MG tablet Take 80 mg by mouth 2 (two) times daily.    Yes [provider]  ranolazine (RANEXA) 500 MG 12 hr tablet Take 500 mg by mouth 2 (two) times daily.   Yes [provider]  spironolactone (ALDACTONE) 25 MG tablet Take 12.5 mg by mouth daily.   Yes [provider]  tamsulosin (FLOMAX) 0.4 MG CAPS capsule Take 0.4 mg by mouth daily.   Yes [provider]  torsemide (DEMADEX) 20 MG tablet Take 40 mg by mouth daily.   Yes [provider]  albuterol (PROVENTIL HFA;VENTOLIN HFA) 108 (90 Base) MCG/ACT inhaler Inhale 2 puffs into the lungs 4 (four) times daily as needed for wheezing or shortness of breath.     [provider]  albuterol-ipratropium (COMBIVENT) 18-103 MCG/ACT inhaler Inhale 1 puff into the lungs 2 times daily at 12 noon and 4 pm.    [provider]  calcium carbonate (TUMS - DOSED IN MG ELEMENTAL CALCIUM) 500 MG chewable tablet Chew 1 tablet by mouth as needed for indigestion or heartburn.    [provider]  nitroGLYCERIN (NITROSTAT) 0.4 MG SL tablet Place 0.4 mg under the tongue every 5 (five) minutes as needed for chest pain.    [provider]    Allergies   Allergen Reactions  . Benadryl [Diphenhydramine] Other (See Comments)    " Hyperactivity"  . Doxycycline Swelling    Pt went into pulmonary edema.  . Lopid [Gemfibrozil] Swelling    "I gain 1 pound a day for 30 days."    Family History  Problem Relation Age of Onset  . CAD Mother   . Cancer Mother   . Diabetes Mother   . Alzheimer's disease Father   . Cancer Father   . Heart disease Father     Social History Social History   Tobacco Use  . Smoking status: Former Smoker    Packs/day: 2.00    Years: 30.00    Pack years: 60.00    Last attempt to quit: 12/03/1996    Years since quitting: 21.2  . Smokeless tobacco: Never Used  Substance Use Topics  . Alcohol use: No    Alcohol/week: 0.0 oz  . Drug use: No    Review of Systems  Constitutional: Negative for fever. Eyes:  Negative for visual changes. ENT: Negative for sore throat. Cardiovascular: Negative for chest pain. Respiratory: Positive as per HPI for shortness of breath. Gastrointestinal: Negative for abdominal pain, vomiting and diarrhea. Genitourinary: Negative for dysuria. Musculoskeletal: Negative for back pain. Skin: Negative for rash. Neurological: Negative for headache.  ____________________________________________   PHYSICAL EXAM:  VITAL SIGNS: ED Triage Vitals  Enc Vitals Group     BP 02/18/18 0810 (!) 145/83     Pulse Rate 02/18/18 0810 (!) 111     Resp 02/18/18 0810 (!) 37     Temp 02/18/18 0810 (!) 97.3 F (36.3 C)     Temp Source 02/18/18 0810 Axillary     SpO2 02/18/18 0810 100 %     Weight 02/18/18 0807 251 lb (113.9 kg)     Height 02/18/18 0807 5\' 8"  (1.727 m)     Head Circumference --      Peak Flow --      Pain Score --      Pain Loc --      Pain Edu? --      Excl. in [place]? --      Constitutional: Alert and oriented.  On BiPAP, mild respiratory distress. HEENT   Head: Normocephalic and atraumatic.      Eyes: Conjunctivae are normal. Pupils equal and round.       Ears:          Nose: No congestion/rhinnorhea.   Mouth/Throat: Mucous membranes are moist.   Neck: No stridor. Cardiovascular/Chest: Tachycardic rate, irregularly irregular rhythm.  No murmurs, rubs, or gallops. Respiratory: Mild subcostal retractions.  He has decreased air movement throughout all fields with moderate end expiratory wheezing.  No rales or rhonchi appreciated. Gastrointestinal: Soft. No distention, no guarding, no rebound. Nontender.  Obese. Genitourinary/rectal:Deferred Musculoskeletal: Nontender with normal range of motion in all extremities. No joint effusions.  No lower extremity tenderness.  No edema. Neurologic:  Normal speech and language. No gross or focal neurologic deficits are appreciated. Skin:  Skin is warm, dry and intact. No rash noted. Psychiatric: Mood and affect are normal. Speech and behavior are normal. Patient exhibits appropriate insight and judgment.   ____________________________________________  LABS (pertinent positives/negatives) I, Lisa Roca, MD the attending physician have reviewed the labs noted below.  Labs Reviewed  CBC WITH DIFFERENTIAL/PLATELET - Abnormal; Notable for the following components:      Result Value   WBC 15.0 (*)    RBC 4.09 (*)    Hemoglobin 12.0 (*)    HCT 37.3 (*)    RDW 15.8 (*)    Neutro Abs 12.7 (*)    All other components within normal limits  COMPREHENSIVE METABOLIC PANEL - Abnormal; Notable for the following components:   Glucose, Bld 156 (*)    Calcium 8.6 (*)    GFR calc non Af Amer 56 (*)    All other components within normal limits  TROPONIN I - Abnormal; Notable for the following components:   Troponin I 0.05 (*)    All other components within normal limits  BRAIN NATRIURETIC PEPTIDE - Abnormal; Notable for the following components:   B Natriuretic Peptide 805.0 (*)    All other components within normal limits  INFLUENZA PANEL BY PCR (TYPE A & B)    ____________________________________________     EKG I, Lisa Roca, MD, the attending physician have personally viewed and interpreted all ECGs.  112 bpm.  Normal sinus rhythm at times with paced rhythm at other times.  Native rhythm appears  to be a left bundle branch block.  Nonspecific ST and T wave. ____________________________________________  RADIOLOGY   Chest x-ray reviewed by me: Chronic lung findings without focal infiltrate Radiologist interpretation:  IMPRESSION: Increasing vascular congestion and interstitial edema. __________________________________________  PROCEDURES  Procedure(s) performed: None  Procedures  Critical Care performed: CRITICAL CARE Performed by: Lisa Roca   Total critical care time: 30 minutes  Critical care time was exclusive of separately billable procedures and treating other patients.  Critical care was necessary to treat or prevent imminent or life-threatening deterioration.  Critical care was time spent personally by me on the following activities: development of treatment plan with patient and/or surrogate as well as nursing, discussions with consultants, evaluation of patient's response to treatment, examination of patient, obtaining history from patient or surrogate, ordering and performing treatments and interventions, ordering and review of laboratory studies, ordering and review of radiographic studies, pulse oximetry and re-evaluation of patient's condition.    ____________________________________________  ED COURSE / ASSESSMENT AND PLAN  Pertinent labs & imaging results that were available during my care of the patient were reviewed by me and considered in my medical decision making (see chart for details).    Patient arrived in respiratory distress on BiPAP for COPD versus CHF.  Patient has been treated for CHF exacerbation over the past week or so with increased dose at home, and he feels like it is probably CHF.  BiPAP did seem to help him, however he still has  some wheezing here and COPD [place] treated for COPD exacerbation as well.  Laboratory studies are overall reassuring.  His chest x-ray does not show pneumonia neither is he having clinical symptoms of pneumonia.  His x-ray does look consistent with vascular congestion and interstitial edema, with CHF exacerbation, failed outpatient management.  He is on any additional inpatient treatment of his COPD exacerbation with CHF exacerbation.  He was given IV Lasix here in the emergency department.  DIFFERENTIAL DIAGNOSIS: Differential includes, but is not limited to, viral syndrome, bronchitis including COPD exacerbation, pneumonia, reactive airway disease including asthma, CHF including exacerbation with or without pulmonary/interstitial edema, pneumothorax, ACS, thoracic trauma, and pulmonary embolism.  CONSULTATIONS:   Hospitalist for admission, discussed with Dr. Doy Hutching.  Patient / Family / Caregiver informed of clinical course, medical decision-making process, and agree with plan.    ___________________________________________   FINAL CLINICAL IMPRESSION(S) / ED DIAGNOSES   Final diagnoses:  COPD exacerbation ([place])  Acute on chronic congestive heart failure, unspecified heart failure type ([place])      ___________________________________________        Note: This dictation was prepared with Dragon dictation. Any transcriptional errors that result from this process are unintentional    Lisa Roca, MD 02/18/18 1152

## 2018-02-18 NOTE — Plan of Care (Signed)
  Health Behavior/Discharge Planning: Ability to manage health-related needs will improve 02/18/2018 2051 - Progressing by Marylouise Stacks, RN   Clinical Measurements: Ability to maintain clinical measurements within normal limits will improve 02/18/2018 2051 - Progressing by Marylouise Stacks, RN Diagnostic test results will improve 02/18/2018 2051 - Progressing by Marylouise Stacks, RN Respiratory complications will improve 02/18/2018 2051 - Progressing by Marylouise Stacks, RN Cardiovascular complication will be avoided 02/18/2018 2051 - Progressing by Marylouise Stacks, RN   Activity: Risk for activity intolerance will decrease 02/18/2018 2051 - Progressing by Marylouise Stacks, RN

## 2018-02-18 NOTE — ED Notes (Signed)
Pt WOB has decreased and pt states that breathing has improved.

## 2018-02-18 NOTE — ED Notes (Signed)
Pt being trialed off of bi-pap per Dr. Reita Cliche.  Pt placed on 4L [place], respirations even and unlabored at this time, skin warm and dry, patient appears in no acute distress.

## 2018-02-18 NOTE — ED Notes (Signed)
Date and time results received: 02/18/18 0920 (use smartphrase ".now" to insert current time)  Test: troponin Critical Value: 0.05  Name of Provider Notified: lord

## 2018-02-18 NOTE — Care Management (Addendum)
Patient has nocturnal oxygen at home.  will need home 02 assessment prior to discharge.  He has missed 7 appointments for the [facility]. Patient says that most of the appointments were scheduled in the mornings and it is difficult for him to get to early morning appointments- prefers afternoons and has since inform the clinc

## 2018-02-18 NOTE — ED Notes (Signed)
Urinal at bedside.  

## 2018-02-18 NOTE — Consult Note (Signed)
[facility] Cardiology Consultation Note  Patient ID: [name], MRN: [id number], DOB/AGE: November 07, 1946 72 y.o. Admit date: 02/18/2018   Date of Consult: 02/18/2018 Primary Physician: [name], MD Primary Cardiologist: Nehemiah Massed  Chief Complaint:  Chief Complaint  Patient presents with  . Respiratory Distress   Reason for Consult: Acute flash pulmonary edema  HPI: 72 y.o. male with known coronary artery disease and acute on chronic systolic dysfunction congestive heart failure hypertension hyperlipidemia for which the patient has been on maximal appropriate medication management for all aspects and risk factors for cardiovascular disease and congestive heart failure.  With this the patient has had multiple episodes of flash pulmonary edema which occur very quickly for him and are sometimes associated with high blood pressure recurrently associated with lower blood pressure.  He has had recent admissions to the hospital in the first several months of multiple times.  His typical scenario is that he is doing well and appropriately lowering his sodium content in his foods when he has severe shortness of breath which is not helped by oral Lasix or oxygen.  He gets severe pulmonary edema and requires intravenous Lasix and occasionally BiPAP.  This quickly reverses his issues and he improved dramatically with diuresis.  He typically has not had any elevation of troponin with current troponin being 0.06.  There is been no EKG changes and his BNP typically is less than 1000.  Today is 805.  The patient has felt much better at this time and is back to his normal self.  We have further evaluated this with stress test as well as echocardiogram showing no evidence of changes recently.  There is no evidence of flail mitral leaflet and there could be some other functional issue which may be revealed by cardiac MRI with functional assessment and/or looking for ischemia  Past Medical History:   Diagnosis Date  . Anemia   . Cancer ([place]) 12/2013   prostate  . Cardiogenic pulmonary edema ([place]) 12/19/2014  . Cardiomyopathy, ischemic   . CHF (congestive heart failure) ([place])   . COPD (chronic obstructive pulmonary disease) ([place])   . Diabetes mellitus without complication ([place])   . GERD (gastroesophageal reflux disease)   . Hypercholesteremia   . Hypertension   . Myocardial infarction ([place]) [id number]  . Shortness of breath dyspnea       Surgical History:  Past Surgical History:  Procedure Laterality Date  . CARDIAC CATHETERIZATION N/A 02/02/2016   Procedure: Left Heart Cath and Coronary Angiography;  Surgeon: [name], MD;  Location: [place] [facility];  Service: Cardiovascular;  Laterality: N/A;  . CORONARY ANGIOPLASTY WITH STENT PLACEMENT    . CORONARY ARTERY BYPASS GRAFT  11/24/2010  . IMPLANTABLE CARDIOVERTER DEFIBRILLATOR (ICD) GENERATOR CHANGE Left 12/12/2015   Procedure: DUAL LEAD PLACEMENT CARDIAC DIFIBRILLATOR;  Surgeon: [name], MD;  Location: [facility] ORS;  Service: Cardiovascular;  Laterality: Left;  . TONSILLECTOMY       Home Meds: Prior to Admission medications   Medication Sig Start Date End Date Taking? Authorizing Provider  allopurinol (ZYLOPRIM) 100 MG tablet Take 100 mg by mouth daily.   Yes [provider]  atorvastatin (LIPITOR) 80 MG tablet Take 1 tablet (80 mg total) by mouth every evening. 04/21/17  Yes Theodoro [name], MD  budesonide-formoterol (SYMBICORT) 80-4.5 MCG/ACT inhaler Inhale 2 puffs into the lungs 2 (two) times daily.   Yes [provider]  carvedilol (COREG) 3.125 MG tablet Take 1 tablet (3.125 mg total) by mouth  2 (two) times daily with a meal. 01/01/18  Yes Gladstone [name], MD  citalopram (CELEXA) 20 MG tablet Take 1 tablet by mouth 2 (two) times daily.   Yes [provider]  clopidogrel (PLAVIX) 75 MG tablet Take 75 mg by mouth every morning.   Yes [provider]  co-enzyme Q-10 30 MG  capsule Take 30 mg by mouth daily.   Yes [provider]  ferrous sulfate 325 (65 FE) MG tablet Take 325 mg by mouth 2 (two) times daily with a meal.   Yes [provider]  finasteride (PROSCAR) 5 MG tablet Take 5 mg by mouth at bedtime.   Yes [provider]  FLUoxetine (PROZAC) 20 MG capsule Take 20 mg by mouth daily.   Yes [provider]  gabapentin (NEURONTIN) 300 MG capsule Take 300 mg by mouth 2 (two) times daily.    Yes [provider]  insulin glargine (LANTUS) 100 UNIT/ML injection Inject 0.6 mLs (60 Units total) into the skin at bedtime. 01/01/18  Yes Gladstone [name], MD  insulin lispro (HUMALOG) 100 UNIT/ML KiwkPen Inject 0-18 Units into the skin 3 (three) times daily with meals. Per sliding scale   Yes [provider]  loratadine (CLARITIN) 10 MG tablet Take 10 mg by mouth daily.   Yes [provider]  Multiple Vitamins-Minerals (CENTRUM SILVER PO) Take 1 tablet by mouth every morning.   Yes [provider]  pantoprazole (PROTONIX) 40 MG tablet Take 80 mg by mouth 2 (two) times daily.    Yes [provider]  ranolazine (RANEXA) 500 MG 12 hr tablet Take 500 mg by mouth 2 (two) times daily.   Yes [provider]  spironolactone (ALDACTONE) 25 MG tablet Take 12.5 mg by mouth daily.   Yes [provider]  tamsulosin (FLOMAX) 0.4 MG CAPS capsule Take 0.4 mg by mouth daily.   Yes [provider]  torsemide (DEMADEX) 20 MG tablet Take 40 mg by mouth daily.   Yes [provider]  albuterol (PROVENTIL HFA;VENTOLIN HFA) 108 (90 Base) MCG/ACT inhaler Inhale 2 puffs into the lungs 4 (four) times daily as needed for wheezing or shortness of breath.     [provider]  albuterol-ipratropium (COMBIVENT) 18-103 MCG/ACT inhaler Inhale 1 puff into the lungs 2 times daily at 12 noon and 4 pm.    [provider]  calcium carbonate (TUMS - DOSED IN MG ELEMENTAL CALCIUM)  500 MG chewable tablet Chew 1 tablet by mouth as needed for indigestion or heartburn.    [provider]  nitroGLYCERIN (NITROSTAT) 0.4 MG SL tablet Place 0.4 mg under the tongue every 5 (five) minutes as needed for chest pain.    [provider]    Inpatient Medications:  . allopurinol  100 mg Oral Daily  . atorvastatin  80 mg Oral QPM  . carvedilol  3.125 mg Oral BID WC  . citalopram  20 mg Oral BID  . clopidogrel  75 mg Oral [id number]  . docusate sodium  100 mg Oral BID  . enoxaparin (LOVENOX) injection  40 mg Subcutaneous Q24H  . ferrous sulfate  325 mg Oral BID WC  . finasteride  5 mg Oral QHS  . FLUoxetine  20 mg Oral Daily  . furosemide  40 mg Intravenous Q12H  . gabapentin  300 mg Oral BID  . insulin aspart  0-9 Units Subcutaneous TID WC  . insulin glargine  60 Units Subcutaneous QHS  . ipratropium-albuterol  3 mL  Nebulization QID  . loratadine  10 mg Oral Daily  . mometasone-formoterol  2 puff Inhalation BID  . nitroGLYCERIN  1 inch Topical Q6H  . pantoprazole (PROTONIX) IV  40 mg Intravenous Q12H  . potassium chloride  20 mEq Oral BID  . ranolazine  500 mg Oral BID  . sodium chloride flush  3 mL Intravenous Q12H  . spironolactone  12.5 mg Oral Daily  . tamsulosin  0.4 mg Oral Daily   . sodium chloride      Allergies:  Allergies  Allergen Reactions  . Benadryl [Diphenhydramine] Other (See Comments)    " Hyperactivity"  . Doxycycline Swelling    Pt went into pulmonary edema.  . Lopid [Gemfibrozil] Swelling    "I gain 1 pound a day for 30 days."    Social History   Socioeconomic History  . Marital status: Married    Spouse name: Not on file  . Number of children: Not on file  . Years of education: 15  . Highest education level: High school graduate  Social Needs  . Financial resource strain: Not hard at all  . Food insecurity - worry: Never true  . Food insecurity - inability: Never true  . Transportation needs - medical: No  .  Transportation needs - non-medical: No  Occupational History  . Occupation: retired  Tobacco Use  . Smoking status: Former Smoker    Packs/day: 2.00    Years: 30.00    Pack years: 60.00    Last attempt to quit: 12/03/1996    Years since quitting: 21.2  . Smokeless tobacco: Never Used  Substance and Sexual Activity  . Alcohol use: No    Alcohol/week: 0.0 oz  . Drug use: No  . Sexual activity: Not on file  Other Topics Concern  . Not on file  Social History Narrative  . Not on file     Family History  Problem Relation Age of Onset  . CAD Mother   . Cancer Mother   . Diabetes Mother   . Alzheimer's disease Father   . Cancer Father   . Heart disease Father      Review of Systems Positive for shortness of breath pulmonary edema Negative for: General:  chills, fever, night sweats or weight changes.  Cardiovascular: PND orthopnea syncope dizziness  Dermatological skin lesions rashes Respiratory: Cough congestion Urologic: Frequent urination urination at night and hematuria Abdominal: negative for nausea, vomiting, diarrhea, bright red blood per rectum, melena, or hematemesis Neurologic: negative for visual changes, and/or hearing changes  All other systems reviewed and are otherwise negative except as noted above.  Labs: Recent Labs    02/18/18 0814 02/18/18 1447  TROPONINI 0.05* 0.06*   Lab Results  Component Value Date   WBC 15.0 (H) 02/18/2018   HGB 12.0 (L) 02/18/2018   HCT 37.3 (L) 02/18/2018   MCV 91.1 02/18/2018   PLT 260 02/18/2018    Recent Labs  Lab 02/18/18 0814  NA 141  K 3.7  CL 103  CO2 26  BUN 19  CREATININE 1.24  CALCIUM 8.6*  PROT 7.9  BILITOT 0.8  ALKPHOS 68  ALT 21  AST 25  GLUCOSE 156*   Lab Results  Component Value Date   CHOL 140 01/05/2014   HDL 33 (L) 01/05/2014   LDLCALC 86 01/05/2014   TRIG 103 01/05/2014   No results found for: DDIMER  Radiology/Studies:  Dg Chest 2 View  Result Date: 02/12/2018 CLINICAL DATA:   Shortness of  breath since this morning with mild chest discomfort. History of pulmonary edema. EXAM: CHEST - 2 VIEW COMPARISON:  Chest x-rays dated 12/31/2017 and 12/15/2016. FINDINGS: Mild cardiomegaly is stable. Left chest wall pacemaker leads appear stable in position. Median sternotomy wires appear intact and stable in alignment. Coarse lung markings bilaterally, stable compared to multiple prior studies suggesting chronic interstitial lung disease. Also suspect chronic bronchitic changes centrally. No confluent opacity to suggest a developing pneumonia. No overt alveolar pulmonary edema. No pleural effusion or pneumothorax seen. No acute or suspicious osseous finding. IMPRESSION: 1. No active cardiopulmonary disease. No evidence of pneumonia or pulmonary edema. 2. Probable chronic interstitial lung disease and/or chronic bronchitic changes. 3. Stable mild cardiomegaly. Electronically Signed   By: Franki Cabot M.D.   On: 02/12/2018 16:46   Dg Chest Portable 1 View  Result Date: 02/18/2018 CLINICAL DATA:  Short of breath today at 2 a.m. EXAM: PORTABLE CHEST 1 VIEW COMPARISON:  02/12/2018 FINDINGS: Stable left subclavian AICD. Normal heart size. Vascular congestion has increased. Mild interstitial edema. No pneumothorax. No pleural effusion. IMPRESSION: Increasing vascular congestion and interstitial edema. Electronically Signed   By: Marybelle Killings M.D.   On: 02/18/2018 09:02    EKG: Sinus rhythm  Weights: Filed Weights   02/18/18 0807  Weight: 251 lb (113.9 kg)     Physical Exam: Blood pressure 139/68, pulse 87, temperature 97.9 F (36.6 C), temperature source Oral, resp. rate 18, height 5\' 8"  (1.727 m), weight 251 lb (113.9 kg), SpO2 98 %. Body mass index is 38.16 kg/m. General: Well developed, well nourished, in no acute distress. Head eyes ears nose throat: Normocephalic, atraumatic, sclera non-icteric, no xanthomas, nares are without discharge. No apparent thyromegaly and/or  mass  Lungs: Normal respiratory effort.  no wheezes, few basilar rales, no rhonchi.  Heart: RRR with normal S1 S2. no murmur gallop, no rub, PMI is normal size and placement, carotid upstroke normal without bruit, jugular venous pressure is normal Abdomen: Soft, non-tender,  distended with normoactive bowel sounds. No hepatomegaly. No rebound/guarding. No obvious abdominal masses. Abdominal aorta is normal size without bruit Extremities: 1+ edema. no cyanosis, no clubbing, no ulcers  Peripheral : 2+ bilateral upper extremity pulses, 2+ bilateral femoral pulses, 2+ bilateral dorsal pedal pulse Neuro: Alert and oriented. No facial asymmetry. No focal deficit. Moves all extremities spontaneously. Musculoskeletal: Normal muscle tone without kyphosis Psych:  Responds to questions appropriately with a normal affect.    Assessment: 72 year old male with acute on chronic systolic dysfunction heart failure with flash pulmonary edema without evidence of myocardial infarction on maximized appropriate medication management and appropriate risk management significantly improved by BiPAP as well as intravenous Lasix  Plan: 1.  Continue appropriate outpatient medication management as before for risk factor management and treatment of cardiovascular disease and coronary atherosclerosis 2.  Continue intravenous Lasix until tomorrow and change back to oral Lasix 3.  Continue cardiac rehab as well as congestive heart failure clinic assessment 4.  Further outpatient evaluation with possible cardiac MRI for ischemia and also further consideration of renal artery stenosis which he has not had in the past and does not have any other signs or symptoms suggesting 5.  Again ambulation and follow for improvements of symptoms  Signed, [name] M.D. [facility] 02/18/2018, 4:21 PM

## 2018-02-18 NOTE — ED Triage Notes (Signed)
Acute onset SHOB at 2 am today. Arrived on cpap. Labored. Hx CHF and COPD. IV in place

## 2018-02-18 NOTE — H&P (Signed)
History and Physical    [name]:[id number] DOB: [birth year] DOA: 02/18/2018  Referring physician: Dr. Reita Cliche PCP: [name], MD  Specialists: Dr. Nehemiah Massed  Chief Complaint: SOB  HPI: [name] is a 72 y.o. male has a past medical history significant for CAD, CHF, COPD, DM, and OSA on CPAP and nocturnal O2 at home now with acute onset SOB which awoke him from sleep. Denies CP. No N/V/D. In ER, pt markedly hypoxic and somnolent. Placed on BiPAP with improvement. Now stable off BiPAP on [place] O2. He is now admitted.  Review of Systems: The patient denies anorexia, fever, weight loss,, vision loss, decreased hearing, hoarseness, chest pain, syncope, peripheral edema, balance deficits, hemoptysis, abdominal pain, melena, hematochezia, severe indigestion/heartburn, hematuria, incontinence, genital sores, muscle weakness, suspicious skin lesions, transient blindness, difficulty walking, depression, unusual weight change, abnormal bleeding, enlarged lymph nodes, angioedema, and breast masses.   Past Medical History:  Diagnosis Date  . Anemia   . Cancer ([place]) 12/2013   prostate  . Cardiogenic pulmonary edema ([place]) 12/19/2014  . Cardiomyopathy, ischemic   . CHF (congestive heart failure) ([place])   . COPD (chronic obstructive pulmonary disease) ([place])   . Diabetes mellitus without complication ([place])   . GERD (gastroesophageal reflux disease)   . Hypercholesteremia   . Hypertension   . Myocardial infarction ([place]) [id number]  . Shortness of breath dyspnea    Past Surgical History:  Procedure Laterality Date  . CARDIAC CATHETERIZATION N/A 02/02/2016   Procedure: Left Heart Cath and Coronary Angiography;  Surgeon: [name], MD;  Location: [facility];  Service: Cardiovascular;  Laterality: N/A;  . CORONARY ANGIOPLASTY WITH STENT PLACEMENT    . CORONARY ARTERY BYPASS GRAFT  11/24/2010  . IMPLANTABLE CARDIOVERTER DEFIBRILLATOR (ICD) GENERATOR CHANGE Left  12/12/2015   Procedure: DUAL LEAD PLACEMENT CARDIAC DIFIBRILLATOR;  Surgeon: [name], MD;  Location: [facility];  Service: Cardiovascular;  Laterality: Left;  . TONSILLECTOMY     Social History:  reports that he quit smoking about 21 years ago. He has a 60.00 pack-year smoking history. he has never used smokeless tobacco. He reports that he does not drink alcohol or use drugs.  Allergies  Allergen Reactions  . Benadryl [Diphenhydramine] Other (See Comments)    " Hyperactivity"  . Doxycycline Swelling    Pt went into pulmonary edema.  . Lopid [Gemfibrozil] Swelling    "I gain 1 pound a day for 30 days."    Family History  Problem Relation Age of Onset  . CAD Mother   . Cancer Mother   . Diabetes Mother   . Alzheimer's disease Father   . Cancer Father   . Heart disease Father     Prior to Admission medications   Medication Sig Start Date End Date Taking? Authorizing Provider  allopurinol (ZYLOPRIM) 100 MG tablet Take 100 mg by mouth daily.   Yes [provider]  atorvastatin (LIPITOR) 80 MG tablet Take 1 tablet (80 mg total) by mouth every evening. 04/21/17  Yes Theodoro [name], MD  budesonide-formoterol (SYMBICORT) 80-4.5 MCG/ACT inhaler Inhale 2 puffs into the lungs 2 (two) times daily.   Yes [provider]  carvedilol (COREG) 3.125 MG tablet Take 1 tablet (3.125 mg total) by mouth 2 (two) times daily with a meal. 01/01/18  Yes Gladstone [name], MD  citalopram (CELEXA) 20 MG tablet Take 1 tablet by mouth 2 (two) times daily.   Yes [provider]  clopidogrel (PLAVIX) 75 MG tablet  Take 75 mg by mouth every morning.   Yes [provider]  co-enzyme Q-10 30 MG capsule Take 30 mg by mouth daily.   Yes [provider]  ferrous sulfate 325 (65 FE) MG tablet Take 325 mg by mouth 2 (two) times daily with a meal.   Yes [provider]  finasteride (PROSCAR) 5 MG tablet Take 5 mg by mouth at bedtime.   Yes [provider]   FLUoxetine (PROZAC) 20 MG capsule Take 20 mg by mouth daily.   Yes [provider]  gabapentin (NEURONTIN) 300 MG capsule Take 300 mg by mouth 2 (two) times daily.    Yes [provider]  insulin glargine (LANTUS) 100 UNIT/ML injection Inject 0.6 mLs (60 Units total) into the skin at bedtime. 01/01/18  Yes Gladstone [name], MD  insulin lispro (HUMALOG) 100 UNIT/ML KiwkPen Inject 0-18 Units into the skin 3 (three) times daily with meals. Per sliding scale   Yes [provider]  loratadine (CLARITIN) 10 MG tablet Take 10 mg by mouth daily.   Yes [provider]  Multiple Vitamins-Minerals (CENTRUM SILVER PO) Take 1 tablet by mouth every morning.   Yes [provider]  pantoprazole (PROTONIX) 40 MG tablet Take 80 mg by mouth 2 (two) times daily.    Yes [provider]  ranolazine (RANEXA) 500 MG 12 hr tablet Take 500 mg by mouth 2 (two) times daily.   Yes [provider]  spironolactone (ALDACTONE) 25 MG tablet Take 12.5 mg by mouth daily.   Yes [provider]  tamsulosin (FLOMAX) 0.4 MG CAPS capsule Take 0.4 mg by mouth daily.   Yes [provider]  torsemide (DEMADEX) 20 MG tablet Take 40 mg by mouth daily.   Yes [provider]  albuterol (PROVENTIL HFA;VENTOLIN HFA) 108 (90 Base) MCG/ACT inhaler Inhale 2 puffs into the lungs 4 (four) times daily as needed for wheezing or shortness of breath.     [provider]  albuterol-ipratropium (COMBIVENT) 18-103 MCG/ACT inhaler Inhale 1 puff into the lungs 2 times daily at 12 noon and 4 pm.    [provider]  calcium carbonate (TUMS - DOSED IN MG ELEMENTAL CALCIUM) 500 MG chewable tablet Chew 1 tablet by mouth as needed for indigestion or heartburn.    [provider]  nitroGLYCERIN (NITROSTAT) 0.4 MG SL tablet Place 0.4 mg under the tongue every 5 (five) minutes as needed for chest pain.    [provider]   Physical  Exam: Vitals:   02/18/18 0930 02/18/18 1000 02/18/18 1015 02/18/18 1100  BP: (!) 152/70 (!) 164/79 (!) 157/76 (!) 168/73  Pulse: 87 95 90 86  Resp: (!) 25 (!) 21 (!) 25 18  Temp:      TempSrc:      SpO2: 98% 99% 100% 97%  Weight:      Height:         General:  No apparent distress, WDWN, [place]/AT  Eyes: PERRL, EOMI, no scleral icterus, conjunctiva clear  ENT: moist oropharynx without exudate, TM's benign, dentition fair  Neck: supple, no lymphadenopathy. No bruits or thyromegaly  Cardiovascular: regular rate without MRG; 2+ peripheral pulses, no JVD, no peripheral edema  Respiratory: bilateral rales without wheezes or rhonchi. No dullness. Respiratory effort increased  Abdomen: soft, non tender to palpation, positive bowel sounds, no guarding, no rebound  Skin: no rashes or lesions  Musculoskeletal: normal bulk and tone, no joint swelling  Psychiatric: normal mood and affect, A&OX3  Neurologic: CN 2-12 grossly intact, Motor strength 5/5 in all 4 groups with symmetric DTR's and non-focal sensory exam  Labs on Admission:  Basic Metabolic Panel: Recent Labs  Lab 02/12/18 1514 02/18/18 0814  NA 141 141  K 3.5 3.7  CL 103 103  CO2 27 26  GLUCOSE 186* 156*  BUN 15 19  CREATININE 1.07 1.24  CALCIUM 8.2* 8.6*   Liver Function Tests: Recent Labs  Lab 02/18/18 0814  AST 25  ALT 21  ALKPHOS 68  BILITOT 0.8  PROT 7.9  ALBUMIN 3.9   No results for input(s): LIPASE, AMYLASE in the last 168 hours. No results for input(s): AMMONIA in the last 168 hours. CBC: Recent Labs  Lab 02/12/18 1514 02/18/18 0814  WBC 11.5* 15.0*  NEUTROABS  --  12.7*  HGB 10.9* 12.0*  HCT 33.0* 37.3*  MCV 90.3 91.1  PLT 170 260   Cardiac Enzymes: Recent Labs  Lab 02/12/18 1514 02/18/18 0814  TROPONINI <0.03 0.05*    BNP (last 3 results) Recent Labs    12/05/17 0125 12/30/17 0048 02/18/18 0814  BNP 596.0* 336.0* 805.0*    ProBNP (last 3 results) No results for input(s):  PROBNP in the last 8760 hours.  CBG: Recent Labs  Lab 02/13/18 1414  GLUCAP 137*    Radiological Exams on Admission: Dg Chest Portable 1 View  Result Date: 02/18/2018 CLINICAL DATA:  Short of breath today at 2 a.m. EXAM: PORTABLE CHEST 1 VIEW COMPARISON:  02/12/2018 FINDINGS: Stable left subclavian AICD. Normal heart size. Vascular congestion has increased. Mild interstitial edema. No pneumothorax. No pleural effusion. IMPRESSION: Increasing vascular congestion and interstitial edema. Electronically Signed   By: Marybelle Killings M.D.   On: 02/18/2018 09:02    EKG: Independently reviewed.  Assessment/Plan Principal Problem:   Acute respiratory failure (HCC) Active Problems:   Obstructive sleep apnea   Diabetes (HCC)   Acute on chronic systolic CHF (congestive heart failure) ([place])   Will admit to telemetry with O2 and IV Lasix with NTP and prn IV morphine. Supplement K+. CPAP at night. Echo ordered. Follow enzymes. Consult Cardiology. Wean O2 as tolerated. Repeat labs and CXR in AM. Follow sugars  Diet: low salt, low carb Fluids: saline lock DVT Prophylaxis: Lovenox  Code Status: FULL  Family Communication: none  Disposition Plan: home  Time spent: 50 min

## 2018-02-18 NOTE — Progress Notes (Signed)
*  PRELIMINARY RESULTS* Echocardiogram 2D Echocardiogram has been performed. Definity IV Contrast used on this study.  [name] [name] 02/18/2018, 4:06 PM

## 2018-02-18 NOTE — Care Management Obs Status (Signed)
[facility] NOTIFICATION   Patient Details  Name: [name] MRN: [id number] Date of Birth: [birth year]   Medicare Observation Status Notification Given:  Yes    Katrina Stack, RN 02/18/2018, 6:58 PM

## 2018-02-19 ENCOUNTER — Observation Stay: Payer: PPO

## 2018-02-19 DIAGNOSIS — I472 Ventricular tachycardia: Secondary | ICD-10-CM | POA: Diagnosis not present

## 2018-02-19 DIAGNOSIS — I25810 Atherosclerosis of coronary artery bypass graft(s) without angina pectoris: Secondary | ICD-10-CM | POA: Diagnosis not present

## 2018-02-19 DIAGNOSIS — I509 Heart failure, unspecified: Secondary | ICD-10-CM | POA: Diagnosis not present

## 2018-02-19 DIAGNOSIS — I5023 Acute on chronic systolic (congestive) heart failure: Secondary | ICD-10-CM | POA: Diagnosis not present

## 2018-02-19 DIAGNOSIS — J9601 Acute respiratory failure with hypoxia: Secondary | ICD-10-CM | POA: Diagnosis not present

## 2018-02-19 LAB — COMPREHENSIVE METABOLIC PANEL
ALBUMIN: 3.2 g/dL — AB (ref 3.5–5.0)
ALT: 18 U/L (ref 17–63)
AST: 32 U/L (ref 15–41)
Alkaline Phosphatase: 57 U/L (ref 38–126)
Anion gap: 15 (ref 5–15)
BILIRUBIN TOTAL: 0.7 mg/dL (ref 0.3–1.2)
BUN: 29 mg/dL — AB (ref 6–20)
CALCIUM: 8.5 mg/dL — AB (ref 8.9–10.3)
CO2: 22 mmol/L (ref 22–32)
Chloride: 104 mmol/L (ref 101–111)
Creatinine, Ser: 1.35 mg/dL — ABNORMAL HIGH (ref 0.61–1.24)
GFR calc Af Amer: 59 mL/min — ABNORMAL LOW (ref >60)
GFR calc non Af Amer: 50 mL/min — ABNORMAL LOW (ref >60)
GLUCOSE: 181 mg/dL — AB (ref 65–99)
Potassium: 4 mmol/L (ref 3.5–5.1)
Sodium: 141 mmol/L (ref 135–145)
TOTAL PROTEIN: 6.6 g/dL (ref 6.5–8.1)

## 2018-02-19 LAB — GLUCOSE, CAPILLARY
GLUCOSE-CAPILLARY: 155 mg/dL — AB (ref 65–99)
Glucose-Capillary: 223 mg/dL — ABNORMAL HIGH (ref 65–99)

## 2018-02-19 LAB — CBC
HEMATOCRIT: 31.9 % — AB (ref 40.0–52.0)
Hemoglobin: 10.6 g/dL — ABNORMAL LOW (ref 13.0–18.0)
MCH: 29.7 pg (ref 26.0–34.0)
MCHC: 33.1 g/dL (ref 32.0–36.0)
MCV: 89.9 fL (ref 80.0–100.0)
Platelets: 174 10*3/uL (ref 150–440)
RBC: 3.55 MIL/uL — ABNORMAL LOW (ref 4.40–5.90)
RDW: 15.7 % — AB (ref 11.5–14.5)
WBC: 10.9 10*3/uL — ABNORMAL HIGH (ref 3.8–10.6)

## 2018-02-19 LAB — MAGNESIUM: MAGNESIUM: 1.2 mg/dL — AB (ref 1.7–2.4)

## 2018-02-19 LAB — TROPONIN I: Troponin I: 0.05 ng/mL (ref <0.03)

## 2018-02-19 MED ORDER — MAGNESIUM SULFATE 4 GM/100ML IV SOLN
4.0000 g | INTRAVENOUS | Status: AC
  Administered 2018-02-19: 4 g via INTRAVENOUS
  Filled 2018-02-19: qty 100

## 2018-02-19 MED ORDER — RAMIPRIL 2.5 MG PO CAPS: 2.5000 mg | ORAL_CAPSULE | Freq: Every day | ORAL | 0 refills | Status: DC

## 2018-02-19 MED ORDER — FUROSEMIDE 10 MG/ML IJ SOLN
40.0000 mg | Freq: Once | INTRAMUSCULAR | Status: AC
  Administered 2018-02-19: 40 mg via INTRAVENOUS
  Filled 2018-02-19: qty 4

## 2018-02-19 MED ORDER — IPRATROPIUM-ALBUTEROL 0.5-2.5 (3) MG/3ML IN SOLN
3.0000 mL | Freq: Three times a day (TID) | RESPIRATORY_TRACT | Status: DC
  Administered 2018-02-19: 3 mL via RESPIRATORY_TRACT
  Filled 2018-02-19: qty 3

## 2018-02-19 MED ORDER — MAGNESIUM OXIDE -MG SUPPLEMENT 400 (240 MG) MG PO TABS: 1.0000 | ORAL_TABLET | Freq: Two times a day (BID) | ORAL | 0 refills | Status: DC

## 2018-02-19 MED ORDER — TORSEMIDE 20 MG PO TABS: ORAL_TABLET | ORAL | 0 refills | Status: DC

## 2018-02-19 MED ORDER — POTASSIUM CHLORIDE CRYS ER 10 MEQ PO TBCR: 10.0000 meq | EXTENDED_RELEASE_TABLET | Freq: Every day | ORAL | 0 refills | Status: DC

## 2018-02-19 NOTE — Progress Notes (Signed)
SATURATION QUALIFICATIONS: (This note is used to comply with regulatory documentation for home oxygen)  Patient Saturations on Room Air at Rest = 95%  Patient Saturations on Room Air while Ambulating = 93%  Patient Saturations on na Liters of oxygen while Ambulating = na%  Please briefly explain why patient needs home oxygen: 

## 2018-02-19 NOTE — Discharge Instructions (Signed)
Heart Failure °Heart failure means your heart has trouble pumping blood. This makes it hard for your body to work well. Heart failure is usually a long-term (chronic) condition. You must take good care of yourself and follow your doctor's treatment plan. °Follow these instructions at home: °· Take your heart medicine as told by your doctor. °? Do not stop taking medicine unless your doctor tells you to. °? Do not skip any dose of medicine. °? Refill your medicines before they run out. °? Take other medicines only as told by your doctor or pharmacist. °· Stay active if told by your doctor. The elderly and people with severe heart failure should talk with a doctor about physical activity. °· Eat heart-healthy foods. Choose foods that are without trans fat and are low in saturated fat, cholesterol, and salt (sodium). This includes fresh or frozen fruits and vegetables, fish, lean meats, fat-free or low-fat dairy foods, whole grains, and high-fiber foods. Lentils and dried peas and beans (legumes) are also good choices. °· Limit salt if told by your doctor. °· Cook in a healthy way. Roast, grill, broil, bake, poach, steam, or stir-fry foods. °· Limit fluids as told by your doctor. °· Weigh yourself every morning. Do this after you pee (urinate) and before you eat breakfast. Write down your weight to give to your doctor. °· Take your blood pressure and write it down if your doctor tells you to. °· Ask your doctor how to check your pulse. Check your pulse as told. °· Lose weight if told by your doctor. °· Stop smoking or chewing tobacco. Do not use gum or patches that help you quit without your doctor's approval. °· Schedule and go to doctor visits as told. °· Nonpregnant women should have no more than 1 drink a day. Men should have no more than 2 drinks a day. Talk to your doctor about drinking alcohol. °· Stop illegal drug use. °· Stay current with shots (immunizations). °· Manage your health conditions as told by your  doctor. °· Learn to manage your stress. °· Rest when you are tired. °· If it is really hot outside: °? Avoid intense activities. °? Use air conditioning or fans, or get in a cooler place. °? Avoid caffeine and alcohol. °? Wear loose-fitting, lightweight, and light-colored clothing. °· If it is really cold outside: °? Avoid intense activities. °? Layer your clothing. °? Wear mittens or gloves, a hat, and a scarf when going outside. °? Avoid alcohol. °· Learn about heart failure and get support as needed. °· Get help to maintain or improve your quality of life and your ability to care for yourself as needed. °Contact a doctor if: °· You gain weight quickly. °· You are more short of breath than usual. °· You cannot do your normal activities. °· You tire easily. °· You cough more than normal, especially with activity. °· You have any or more puffiness (swelling) in areas such as your hands, feet, ankles, or belly (abdomen). °· You cannot sleep because it is hard to breathe. °· You feel like your heart is beating fast (palpitations). °· You get dizzy or light-headed when you stand up. °Get help right away if: °· You have trouble breathing. °· There is a change in mental status, such as becoming less alert or not being able to focus. °· You have chest pain or discomfort. °· You faint. °This information is not intended to replace advice given to you by your health care provider. Make sure you   discuss any questions you have with your health care provider. °Document Released: 08/31/2008 Document Revised: 04/29/2016 Document Reviewed: 01/08/2013 °Elsevier Interactive Patient Education © 2017 [employer] ° °

## 2018-02-19 NOTE — Progress Notes (Signed)
[name] to be D/C'd Home per MD order.  Discussed prescriptions and follow up appointments with the patient. Prescriptions given to patient, medication list explained in detail. Pt verbalized understanding.  Allergies as of 02/19/2018      Reactions   Benadryl [diphenhydramine] Other (See Comments)   " Hyperactivity"   Doxycycline Swelling   Pt went into pulmonary edema.   Lopid [gemfibrozil] Swelling   "I gain 1 pound a day for 30 days."      Medication List    TAKE these medications   albuterol 108 (90 Base) MCG/ACT inhaler Commonly known as:  PROVENTIL HFA;VENTOLIN HFA Inhale 2 puffs into the lungs 4 (four) times daily as needed for wheezing or shortness of breath.   albuterol-ipratropium 18-103 MCG/ACT inhaler Commonly known as:  COMBIVENT Inhale 1 puff into the lungs 2 times daily at 12 noon and 4 pm.   allopurinol 100 MG tablet Commonly known as:  ZYLOPRIM Take 100 mg by mouth daily.   atorvastatin 80 MG tablet Commonly known as:  LIPITOR Take 1 tablet (80 mg total) by mouth every evening.   budesonide-formoterol 80-4.5 MCG/ACT inhaler Commonly known as:  SYMBICORT Inhale 2 puffs into the lungs 2 (two) times daily.   calcium carbonate 500 MG chewable tablet Commonly known as:  TUMS - dosed in mg elemental calcium Chew 1 tablet by mouth as needed for indigestion or heartburn.   carvedilol 3.125 MG tablet Commonly known as:  COREG Take 1 tablet (3.125 mg total) by mouth 2 (two) times daily with a meal.   CENTRUM SILVER PO Take 1 tablet by mouth every morning.   citalopram 20 MG tablet Commonly known as:  CELEXA Take 1 tablet by mouth 2 (two) times daily.   clopidogrel 75 MG tablet Commonly known as:  PLAVIX Take 75 mg by mouth every morning.   co-enzyme Q-10 30 MG capsule Take 30 mg by mouth daily.   ferrous sulfate 325 (65 FE) MG tablet Take 325 mg by mouth 2 (two) times daily with a meal.   finasteride 5 MG tablet Commonly known as:   PROSCAR Take 5 mg by mouth at bedtime.   FLUoxetine 20 MG capsule Commonly known as:  PROZAC Take 20 mg by mouth daily.   gabapentin 300 MG capsule Commonly known as:  NEURONTIN Take 300 mg by mouth 2 (two) times daily.   insulin glargine 100 UNIT/ML injection Commonly known as:  LANTUS Inject 0.6 mLs (60 Units total) into the skin at bedtime.   insulin lispro 100 UNIT/ML KiwkPen Commonly known as:  HUMALOG Inject 0-18 Units into the skin 3 (three) times daily with meals. Per sliding scale   loratadine 10 MG tablet Commonly known as:  CLARITIN Take 10 mg by mouth daily.   Magnesium Oxide 400 (240 Mg) MG Tabs Take 1 tablet (400 mg total) by mouth 2 (two) times daily.   nitroGLYCERIN 0.4 MG SL tablet Commonly known as:  NITROSTAT Place 0.4 mg under the tongue every 5 (five) minutes as needed for chest pain.   pantoprazole 40 MG tablet Commonly known as:  PROTONIX Take 80 mg by mouth 2 (two) times daily.   ramipril 2.5 MG capsule Commonly known as:  ALTACE Take 1 capsule (2.5 mg total) by mouth daily.   ranolazine 500 MG 12 hr tablet Commonly known as:  RANEXA Take 500 mg by mouth 2 (two) times daily.   spironolactone 25 MG tablet Commonly known as:  ALDACTONE Take 12.5 mg by  mouth daily.   tamsulosin 0.4 MG Caps capsule Commonly known as:  FLOMAX Take 0.4 mg by mouth daily.   torsemide 20 MG tablet Commonly known as:  DEMADEX 1 tab po twice a day.  For weight gain of 3 lbs in one day or five lbs in a week then increase to 2 tabs in am and one in afternoon What changed:    how much to take  how to take this  when to take this  additional instructions       Vitals:   02/19/18 0750 02/19/18 1521  BP: (!) 130/59 115/61  Pulse: 85 88  Resp: 20 18  Temp: 97.7 F (36.5 C) 97.8 F (36.6 C)  SpO2: 93% 95%    Tele box removed and returned. Skin clean, dry and intact without evidence of skin break down, no evidence of skin tears noted. IV catheter  discontinued intact. Site without signs and symptoms of complications. Dressing and pressure applied. Pt denies pain at this time. No complaints noted.  An After Visit Summary was printed and given to the patient. Patient escorted via [place], and D/C home via private auto.  [name]

## 2018-02-19 NOTE — Progress Notes (Signed)
Notified Dr. Duane Boston of 9 beat run of SVT. Mg and K ordered for this morning.

## 2018-02-19 NOTE — Progress Notes (Signed)
[facility] Encounter Note  Patient: [name] / Admit Date: 02/18/2018 / Date of Encounter: 02/19/2018, 8:03 AM   Subjective: Patient feeling much better today with less shortness of breath no evidence of chest pain.  Patient receiving nebulizer today who he says improves how he feels.  No evidence of myocardial infarction with normal troponin.  Cardiogram does show severe LV systolic dysfunction and moderate dilation with moderate mitral and tricuspid regurgitation unchanged from before with ejection fraction of 20%  Review of Systems: Positive for: Shortness of breath Negative for: Vision change, hearing change, syncope, dizziness, nausea, vomiting,diarrhea, bloody stool, stomach pain, cough, congestion, diaphoresis, urinary frequency, urinary pain,skin lesions, skin rashes Others previously listed  Objective: Telemetry: Normal sinus rhythm Physical Exam: Blood pressure (!) 130/59, pulse 85, temperature 97.7 F (36.5 C), temperature source Oral, resp. rate 20, height 5\' 8"  (1.727 m), weight 242 lb 12.8 oz (110.1 kg), SpO2 93 %. Body mass index is 36.92 kg/m. General: Well developed, well nourished, in no acute distress. Head: Normocephalic, atraumatic, sclera non-icteric, no xanthomas, nares are without discharge. Neck: No apparent masses Lungs: Normal respirations with few wheezes, no rhonchi, no rales , few basilar crackles   Heart: Regular rate and rhythm, normal S1 S2, no murmur, no rub, no gallop, PMI is normal size and placement, carotid upstroke normal without bruit, jugular venous pressure normal Abdomen: Soft, non-tender, distended with normoactive bowel sounds. No hepatosplenomegaly. Abdominal aorta is normal size without bruit Extremities: Trace edema, no clubbing, no cyanosis, no ulcers,  Peripheral: 2+ radial, 2+ femoral, 2+ dorsal pedal pulses Neuro: Alert and oriented. Moves all extremities spontaneously. Psych:  Responds to questions  appropriately with a normal affect.   Intake/Output Summary (Last 24 hours) at 02/19/2018 0803 Last data filed at 02/18/2018 2222 Gross per 24 hour  Intake 240 ml  Output 700 ml  Net -460 ml    Inpatient Medications:  . allopurinol  100 mg Oral Daily  . atorvastatin  80 mg Oral QPM  . carvedilol  3.125 mg Oral BID WC  . citalopram  20 mg Oral BID  . clopidogrel  75 mg Oral BH-q7a  . docusate sodium  100 mg Oral BID  . enoxaparin (LOVENOX) injection  40 mg Subcutaneous Q24H  . ferrous sulfate  325 mg Oral BID WC  . finasteride  5 mg Oral QHS  . FLUoxetine  20 mg Oral Daily  . furosemide  40 mg Intravenous Q12H  . gabapentin  300 mg Oral BID  . insulin aspart  0-9 Units Subcutaneous TID WC  . insulin glargine  60 Units Subcutaneous QHS  . ipratropium-albuterol  3 mL Nebulization QID  . loratadine  10 mg Oral Daily  . mometasone-formoterol  2 puff Inhalation BID  . nitroGLYCERIN  1 inch Topical Q6H  . pantoprazole (PROTONIX) IV  40 mg Intravenous Q12H  . potassium chloride  20 mEq Oral BID  . ranolazine  500 mg Oral BID  . sodium chloride flush  3 mL Intravenous Q12H  . spironolactone  12.5 mg Oral Daily  . tamsulosin  0.4 mg Oral Daily   Infusions:  . sodium chloride      Labs: Recent Labs    02/18/18 0814 02/19/18 0042  NA 141 141  K 3.7 4.0  CL 103 104  CO2 26 22  GLUCOSE 156* 181*  BUN 19 29*  CREATININE 1.24 1.35*  CALCIUM 8.6* 8.5*   Recent Labs    02/18/18 0814 02/19/18 0042  AST 25 32  ALT 21 18  ALKPHOS 68 57  BILITOT 0.8 0.7  PROT 7.9 6.6  ALBUMIN 3.9 3.2*   Recent Labs    02/18/18 0814 02/19/18 0042  WBC 15.0* 10.9*  NEUTROABS 12.7*  --   HGB 12.0* 10.6*  HCT 37.3* 31.9*  MCV 91.1 89.9  PLT 260 174   Recent Labs    02/18/18 0814 02/18/18 1447 02/18/18 2031 02/19/18 0042  TROPONINI 0.05* 0.06* 0.06* 0.05*   Invalid input(s): POCBNP No results for input(s): HGBA1C in the last 72 hours.   Weights: Filed Weights   02/18/18  0807 02/19/18 0340  Weight: 251 lb (113.9 kg) 242 lb 12.8 oz (110.1 kg)     Radiology/Studies:  Dg Chest 2 View  Result Date: 02/12/2018 CLINICAL DATA:  Shortness of breath since this morning with mild chest discomfort. History of pulmonary edema. EXAM: CHEST - 2 VIEW COMPARISON:  Chest x-rays dated 12/31/2017 and 12/15/2016. FINDINGS: Mild cardiomegaly is stable. Left chest wall pacemaker leads appear stable in position. Median sternotomy wires appear intact and stable in alignment. Coarse lung markings bilaterally, stable compared to multiple prior studies suggesting chronic interstitial lung disease. Also suspect chronic bronchitic changes centrally. No confluent opacity to suggest a developing pneumonia. No overt alveolar pulmonary edema. No pleural effusion or pneumothorax seen. No acute or suspicious osseous finding. IMPRESSION: 1. No active cardiopulmonary disease. No evidence of pneumonia or pulmonary edema. 2. Probable chronic interstitial lung disease and/or chronic bronchitic changes. 3. Stable mild cardiomegaly. Electronically Signed   By: Franki Cabot M.D.   On: 02/12/2018 16:46   Dg Chest Portable 1 View  Result Date: 02/18/2018 CLINICAL DATA:  Short of breath today at 2 a.m. EXAM: PORTABLE CHEST 1 VIEW COMPARISON:  02/12/2018 FINDINGS: Stable left subclavian AICD. Normal heart size. Vascular congestion has increased. Mild interstitial edema. No pneumothorax. No pleural effusion. IMPRESSION: Increasing vascular congestion and interstitial edema. Electronically Signed   By: Marybelle Killings M.D.   On: 02/18/2018 09:02     Assessment and Recommendation  72 y.o. male with known coronary artery disease status post coronary artery bypass graft in the remote past with severe dilated cardiomyopathy and ejection fraction of 20% on very appropriate medication management having recurrent acute on chronic systolic dysfunction heart failure and flash pulmonary edema of unknown etiology without  evidence of rapid heart rate malignant type hypertension worsening chronic chronic kidney disease infection anemia ischemia or myocardial infarction.  Patient claims a dietary not contributing to above.  Have discussed at length further functional assessment including cardiac MRI with need for further investigation of primary cause of flash pulmonary edema 1.  Continue current medical regimen for treatment of cardiovascular disease without change 2.  Continue diligent low-sodium diet 3.  Ambulate and follow for improvements of symptoms and possible discharged home today if ambulating well and no further significant issues 4.  Follow-up up later in the week for additional diagnostic testing including cardiac MRI  Signed, [name] M.D. FACC

## 2018-02-19 NOTE — Discharge Summary (Signed)
[facility] NAME: [name]    MR#:  [id number]  DATE OF BIRTH:  Jul 09, 1946  DATE OF ADMISSION:  02/18/2018 ADMITTING PHYSICIAN: Idelle Crouch, MD  DATE OF DISCHARGE: 3/172019  PRIMARY CARE PHYSICIAN: [name], MD    ADMISSION DIAGNOSIS:  COPD exacerbation ([place]) [J44.1] Acute on chronic congestive heart failure, unspecified heart failure type ([place]) [I50.9]  DISCHARGE DIAGNOSIS:  Principal Problem:   Acute respiratory failure (HCC) Active Problems:   Obstructive sleep apnea   Diabetes (HCC)   Acute on chronic systolic CHF (congestive heart failure) (HCC)   CHF (congestive heart failure) ([place])   SECONDARY DIAGNOSIS:   Past Medical History:  Diagnosis Date  . Anemia   . Cancer ([place]) 12/2013   prostate  . Cardiogenic pulmonary edema ([place]) 12/19/2014  . Cardiomyopathy, ischemic   . CHF (congestive heart failure) ([place])   . COPD (chronic obstructive pulmonary disease) ([place])   . Diabetes mellitus without complication ([place])   . GERD (gastroesophageal reflux disease)   . Hypercholesteremia   . Hypertension   . Myocardial infarction ([place]) [id number]  . Shortness of breath dyspnea     HOSPITAL COURSE:   1.  Acute hypoxic respiratory failure.  The patient initially required oxygen.  He was diuresed and is now breathing comfortably on room air.  Does not require oxygen upon going home. 2.  Acute on chronic systolic congestive heart failure patient was diuresed with Lasix 40 mg IV twice daily.  The patient states that he normally feels well when he is under 240 pounds.  The patient is 242 pounds today and he feels a lot better.  He wants to go home.  Today's chest x-ray does still show fluid in the lungs.  I will give an extra dose of Lasix prior to going home.  I advised him to take torsemide 20 mg twice a day at home and if he gains weight or is a higher than the 240 must take 2 tablets in the morning or 1  tablet in the evening.  Patient already on Spironolactone, Coreg and torsemide.  I will add low-dose Altace. 3.  Nonsustained ventricular tachycardia.  Follow-up as outpatient with Dr. Nehemiah Massed. 4.  Hypomagnesemia.  Magnesium replaced during hospital course 4 mg IV x1 and oral magnesium upon discharge. 5.  Essential hypertension continue usual medications and add Altace 6.  Type 2 diabetes mellitus on Lantus and sliding scale 7.  History of COPD 8.  Sleep apnea on CPAP and oxygen at night 9.  Borderline elevation of troponin secondary to acute on chronic systolic congestive heart failure. 10.  Chronic kidney disease stage III likely secondary to diuresis.  Continue to monitor closely as outpatient.  DISCHARGE CONDITIONS:   Satisfactory.  Patient states he wants to go home today.  CONSULTS OBTAINED:  Treatment Team:  [name], MD Yolonda Kida, MD  DRUG ALLERGIES:   Allergies  Allergen Reactions  . Benadryl [Diphenhydramine] Other (See Comments)    " Hyperactivity"  . Doxycycline Swelling    Pt went into pulmonary edema.  . Lopid [Gemfibrozil] Swelling    "I gain 1 pound a day for 30 days."    DISCHARGE MEDICATIONS:   Allergies as of 02/19/2018      Reactions   Benadryl [diphenhydramine] Other (See Comments)   " Hyperactivity"   Doxycycline Swelling   Pt went into pulmonary edema.   Lopid [gemfibrozil] Swelling   "I gain 1  pound a day for 30 days."      Medication List    TAKE these medications   albuterol 108 (90 Base) MCG/ACT inhaler Commonly known as:  PROVENTIL HFA;VENTOLIN HFA Inhale 2 puffs into the lungs 4 (four) times daily as needed for wheezing or shortness of breath.   albuterol-ipratropium 18-103 MCG/ACT inhaler Commonly known as:  COMBIVENT Inhale 1 puff into the lungs 2 times daily at 12 noon and 4 pm.   allopurinol 100 MG tablet Commonly known as:  ZYLOPRIM Take 100 mg by mouth daily.   atorvastatin 80 MG tablet Commonly known as:   LIPITOR Take 1 tablet (80 mg total) by mouth every evening.   budesonide-formoterol 80-4.5 MCG/ACT inhaler Commonly known as:  SYMBICORT Inhale 2 puffs into the lungs 2 (two) times daily.   calcium carbonate 500 MG chewable tablet Commonly known as:  TUMS - dosed in mg elemental calcium Chew 1 tablet by mouth as needed for indigestion or heartburn.   carvedilol 3.125 MG tablet Commonly known as:  COREG Take 1 tablet (3.125 mg total) by mouth 2 (two) times daily with a meal.   CENTRUM SILVER PO Take 1 tablet by mouth every morning.   citalopram 20 MG tablet Commonly known as:  CELEXA Take 1 tablet by mouth 2 (two) times daily.   clopidogrel 75 MG tablet Commonly known as:  PLAVIX Take 75 mg by mouth every morning.   co-enzyme Q-10 30 MG capsule Take 30 mg by mouth daily.   ferrous sulfate 325 (65 FE) MG tablet Take 325 mg by mouth 2 (two) times daily with a meal.   finasteride 5 MG tablet Commonly known as:  PROSCAR Take 5 mg by mouth at bedtime.   FLUoxetine 20 MG capsule Commonly known as:  PROZAC Take 20 mg by mouth daily.   gabapentin 300 MG capsule Commonly known as:  NEURONTIN Take 300 mg by mouth 2 (two) times daily.   insulin glargine 100 UNIT/ML injection Commonly known as:  LANTUS Inject 0.6 mLs (60 Units total) into the skin at bedtime.   insulin lispro 100 UNIT/ML KiwkPen Commonly known as:  HUMALOG Inject 0-18 Units into the skin 3 (three) times daily with meals. Per sliding scale   loratadine 10 MG tablet Commonly known as:  CLARITIN Take 10 mg by mouth daily.   Magnesium Oxide 400 (240 Mg) MG Tabs Take 1 tablet (400 mg total) by mouth 2 (two) times daily.   nitroGLYCERIN 0.4 MG SL tablet Commonly known as:  NITROSTAT Place 0.4 mg under the tongue every 5 (five) minutes as needed for chest pain.   pantoprazole 40 MG tablet Commonly known as:  PROTONIX Take 80 mg by mouth 2 (two) times daily.   ramipril 2.5 MG capsule Commonly known as:   ALTACE Take 1 capsule (2.5 mg total) by mouth daily.   ranolazine 500 MG 12 hr tablet Commonly known as:  RANEXA Take 500 mg by mouth 2 (two) times daily.   spironolactone 25 MG tablet Commonly known as:  ALDACTONE Take 12.5 mg by mouth daily.   tamsulosin 0.4 MG Caps capsule Commonly known as:  FLOMAX Take 0.4 mg by mouth daily.   torsemide 20 MG tablet Commonly known as:  DEMADEX 1 tab po twice a day.  For weight gain of 3 lbs in one day or five lbs in a week then increase to 2 tabs in am and one in afternoon What changed:    how much to take  how to take this  when to take this  additional instructions        DISCHARGE INSTRUCTIONS:   Follow-up PMD 1 week Follow-up with Dr. Nehemiah Massed this week  If you experience worsening of your admission symptoms, develop shortness of breath, life threatening emergency, suicidal or homicidal thoughts you must seek medical attention immediately by calling 911 or calling your MD immediately  if symptoms less severe.  You Must read complete instructions/literature along with all the possible adverse reactions/side effects for all the Medicines you take and that have been prescribed to you. Take any new Medicines after you have completely understood and accept all the possible adverse reactions/side effects.   Please note  You were cared for by a hospitalist during your hospital stay. If you have any questions about your discharge medications or the care you received while you were in the hospital after you are discharged, you can call the unit and asked to speak with the hospitalist on call if the hospitalist that took care of you is not available. Once you are discharged, your primary care physician will handle any further medical issues. Please note that NO REFILLS for any discharge medications will be authorized once you are discharged, as it is imperative that you return to your primary care physician (or establish a relationship with  a primary care physician if you do not have one) for your aftercare needs so that they can reassess your need for medications and monitor your lab values.    Today   CHIEF COMPLAINT:   Chief Complaint  Patient presents with  . Respiratory Distress    HISTORY OF PRESENT ILLNESS:  [name]  is a 72 y.o. male presented with respiratory distress   VITAL SIGNS:  Blood pressure (!) 130/59, pulse 85, temperature 97.7 F (36.5 C), temperature source Oral, resp. rate 20, height 5\' 8"  (1.727 m), weight 110.1 kg (242 lb 12.8 oz), SpO2 93 %.    PHYSICAL EXAMINATION:  GENERAL:  72 y.o.-year-old patient lying in the bed with no acute distress.  EYES: Pupils equal, round, reactive to light and accommodation. No scleral icterus. Extraocular muscles intact.  HEENT: Head atraumatic, normocephalic. Oropharynx and nasopharynx clear.  NECK:  Supple, no jugular venous distention. No thyroid enlargement, no tenderness.  LUNGS:  decreased breath sounds bilateral bases, no wheezing, rales,rhonchi or crepitation. No use of accessory muscles of respiration.  CARDIOVASCULAR: S1, S2 normal. No murmurs, rubs, or gallops.  ABDOMEN: Soft, non-tender, non-distended. Bowel sounds present. No organomegaly or mass.  EXTREMITIES:  trace edema, no cyanosis, or clubbing.  NEUROLOGIC: Cranial nerves II through XII are intact. Muscle strength 5/5 in all extremities. Sensation intact. Gait not checked.  PSYCHIATRIC: The patient is alert and oriented x 3.  SKIN: No obvious rash, lesion, or ulcer.   DATA REVIEW:   CBC Recent Labs  Lab 02/19/18 0042  WBC 10.9*  HGB 10.6*  HCT 31.9*  PLT 174    Chemistries  Recent Labs  Lab 02/19/18 0042  NA 141  K 4.0  CL 104  CO2 22  GLUCOSE 181*  BUN 29*  CREATININE 1.35*  CALCIUM 8.5*  MG 1.2*  AST 32  ALT 18  ALKPHOS 57  BILITOT 0.7    Cardiac Enzymes Recent Labs  Lab 02/19/18 0042  TROPONINI 0.05*    Microbiology Results  Results for orders  placed or performed during the hospital encounter of 12/30/17  MRSA PCR Screening     Status: None  Collection Time: 12/30/17  3:43 AM  Result Value Ref Range Status   MRSA by PCR NEGATIVE NEGATIVE Final    Comment:        The GeneXpert MRSA Assay (FDA approved for NASAL specimens only), is one component of a comprehensive MRSA colonization surveillance program. It is not intended to diagnose MRSA infection nor to guide or monitor treatment for MRSA infections. Performed at [facility], [address] [place], [place] [address]     RADIOLOGY:  Portable Chest 1 View  Result Date: 02/19/2018 CLINICAL DATA:  Patient with congestive heart failure EXAM: PORTABLE CHEST 1 VIEW COMPARISON:  Chest radiograph 02/18/2018 FINDINGS: Monitoring leads overlie the patient. Stable enlarged cardiac and mediastinal contours status post median sternotomy. AICD device overlies the left hemithorax. Pulmonary vascular redistribution and interstitial opacities bilaterally. No pleural effusion or pneumothorax. IMPRESSION: Findings compatible with interstitial pulmonary edema. Electronically Signed   By: Lovey Newcomer M.D.   On: 02/19/2018 08:34   Dg Chest Portable 1 View  Result Date: 02/18/2018 CLINICAL DATA:  Short of breath today at 2 a.m. EXAM: PORTABLE CHEST 1 VIEW COMPARISON:  02/12/2018 FINDINGS: Stable left subclavian AICD. Normal heart size. Vascular congestion has increased. Mild interstitial edema. No pneumothorax. No pleural effusion. IMPRESSION: Increasing vascular congestion and interstitial edema. Electronically Signed   By: Marybelle Killings M.D.   On: 02/18/2018 09:02       Management plans discussed with the patient, family and they are in agreement.  CODE STATUS:     Code Status Orders  (From admission, onward)        Start     Ordered   02/18/18 1249  Full code  Continuous     02/18/18 1248    Code Status History    Date Active Date Inactive Code Status Order ID  Comments User Context   12/30/2017 03:05 01/01/2018 16:09 Full Code [id number]  Harrie Foreman, MD ED   12/05/2017 11:05 12/06/2017 16:59 Full Code [id number]  Harrie Foreman, MD ED   07/17/2017 04:39 07/18/2017 17:59 Full Code [id number]  Harrie Foreman, MD Inpatient   07/01/2017 01:19 07/02/2017 15:46 Full Code [id number]  Lance Coon, MD Inpatient   04/20/2017 02:32 04/21/2017 18:16 Full Code [id number]  Saundra Shelling, MD Inpatient   01/31/2016 01:43 02/02/2016 21:11 Full Code [id number]  Hillary Bow, MD ED   12/12/2015 16:27 12/13/2015 15:29 Full Code [id number]  [name], MD Inpatient    Advance Directive Documentation     Most Recent Value  Type of Advance Directive  Healthcare Power of Attorney, Living will  Pre-existing out of facility DNR order (yellow form or pink MOST form)  No data  "MOST" Form in Place?  No data      TOTAL TIME TAKING CARE OF THIS PATIENT: 35 minutes.    Loletha Grayer M.D on 02/19/2018 at 1:27 PM  Between 7am to 6pm - Pager - [phone]  After 6pm go to [email or web address] - password [email or web address] [facility]  [phone]  CC: Primary care physician; [name], MD

## 2018-02-21 ENCOUNTER — Ambulatory Visit: Payer: PPO | Attending: Family | Admitting: Family

## 2018-02-21 ENCOUNTER — Encounter: Payer: Self-pay | Admitting: Family

## 2018-02-21 VITALS — BP 137/66 | HR 92 | Resp 18 | Ht 68.0 in | Wt 246.1 lb

## 2018-02-21 DIAGNOSIS — Z8546 Personal history of malignant neoplasm of prostate: Secondary | ICD-10-CM | POA: Insufficient documentation

## 2018-02-21 DIAGNOSIS — Z881 Allergy status to other antibiotic agents status: Secondary | ICD-10-CM | POA: Insufficient documentation

## 2018-02-21 DIAGNOSIS — Z79899 Other long term (current) drug therapy: Secondary | ICD-10-CM | POA: Insufficient documentation

## 2018-02-21 DIAGNOSIS — I2510 Atherosclerotic heart disease of native coronary artery without angina pectoris: Secondary | ICD-10-CM | POA: Diagnosis not present

## 2018-02-21 DIAGNOSIS — M109 Gout, unspecified: Secondary | ICD-10-CM | POA: Insufficient documentation

## 2018-02-21 DIAGNOSIS — K219 Gastro-esophageal reflux disease without esophagitis: Secondary | ICD-10-CM | POA: Diagnosis not present

## 2018-02-21 DIAGNOSIS — J449 Chronic obstructive pulmonary disease, unspecified: Secondary | ICD-10-CM | POA: Diagnosis not present

## 2018-02-21 DIAGNOSIS — D649 Anemia, unspecified: Secondary | ICD-10-CM | POA: Insufficient documentation

## 2018-02-21 DIAGNOSIS — Z87891 Personal history of nicotine dependence: Secondary | ICD-10-CM | POA: Insufficient documentation

## 2018-02-21 DIAGNOSIS — G4733 Obstructive sleep apnea (adult) (pediatric): Secondary | ICD-10-CM | POA: Diagnosis not present

## 2018-02-21 DIAGNOSIS — I255 Ischemic cardiomyopathy: Secondary | ICD-10-CM | POA: Diagnosis not present

## 2018-02-21 DIAGNOSIS — E1143 Type 2 diabetes mellitus with diabetic autonomic (poly)neuropathy: Secondary | ICD-10-CM

## 2018-02-21 DIAGNOSIS — I110 Hypertensive heart disease with heart failure: Secondary | ICD-10-CM | POA: Insufficient documentation

## 2018-02-21 DIAGNOSIS — I509 Heart failure, unspecified: Secondary | ICD-10-CM | POA: Diagnosis not present

## 2018-02-21 DIAGNOSIS — E7800 Pure hypercholesterolemia, unspecified: Secondary | ICD-10-CM | POA: Diagnosis not present

## 2018-02-21 DIAGNOSIS — Z955 Presence of coronary angioplasty implant and graft: Secondary | ICD-10-CM | POA: Insufficient documentation

## 2018-02-21 DIAGNOSIS — Z888 Allergy status to other drugs, medicaments and biological substances status: Secondary | ICD-10-CM | POA: Insufficient documentation

## 2018-02-21 DIAGNOSIS — I252 Old myocardial infarction: Secondary | ICD-10-CM | POA: Insufficient documentation

## 2018-02-21 DIAGNOSIS — E119 Type 2 diabetes mellitus without complications: Secondary | ICD-10-CM | POA: Diagnosis not present

## 2018-02-21 DIAGNOSIS — Z794 Long term (current) use of insulin: Secondary | ICD-10-CM | POA: Diagnosis not present

## 2018-02-21 DIAGNOSIS — I5022 Chronic systolic (congestive) heart failure: Secondary | ICD-10-CM

## 2018-02-21 DIAGNOSIS — I10 Essential (primary) hypertension: Secondary | ICD-10-CM

## 2018-02-21 NOTE — Patient Instructions (Addendum)
Continue weighing daily and call for an overnight weight gain of > 2 pounds or a weekly weight gain of >5 pounds.  Take extra torsemide in the morning for a 2 pound or greater weight gain or for increased shortness of breath or any swelling

## 2018-02-21 NOTE — Progress Notes (Signed)
Patient ID: JURIEL CID, male    DOB: October 23, 1946, 72 y.o.   MRN: [id number]  HPI  Mr Bougher is a 72 y/o male with a history of prostate cancer, DM, hyperlipidemia, HTN, anemia, COPD, GERD, MI, previous tobacco use and chronic heart failure.   Echo report from 12/05/17 reviewed and showed an EF of 20-25% along with mild MR and moderate MR. Cardiac catheterization done 02/02/16 showed severed 3-vessel CAD with patent grafts. Continue medication management along with dual antiplatelet therapy.   Admitted 02/18/18 due to HF exacerbation. Cardiology consult obtained. Initially given IV lasix and then transitioned to oral diuretics. Needed oxygen and bipap initially as well. Supplemented with magnesium and discharged the following day per patient's request.             Was in the ED 02/12/18 due to HF exacerbation. He had missed his most recent HF Clinic appointment. Given one dose of IV lasix and released. Admitted 12/30/17 due to HF exacerbation. Initially needed IV lasix and then transitioned to oral diuretics. Needed bipap initially as well. Discharged after 2 days. Admitted 12/05/17 due to HF exacerbation. Cardiology consult obtained. Elevated troponins thought to be due to demand ischemia. Discharged the following day. Was in the ED 12/04/17 with chest pain where he was evaluated and released. Was in the ED 11/22/17 due to gout where he was treated and released.   He presents today for a follow-up visit with a chief complaint of minimal shortness of breath upon moderate exertion. He describes this as chronic in nature having been present for several years with varying levels of severity. He has associated fatigue along with this. He denies any difficulty sleeping, abdominal distention, palpitations, edema, chest pain, cough, dizziness or weight gain. Has recently been in the hospital for a sudden episode of shortness of breath in the middle of the night.   Past Medical History:  Diagnosis Date  .  Anemia   . Cancer ([place]) 12/2013   prostate  . Cardiogenic pulmonary edema ([place]) 12/19/2014  . Cardiomyopathy, ischemic   . CHF (congestive heart failure) ([place])   . COPD (chronic obstructive pulmonary disease) ([place])   . Diabetes mellitus without complication ([place])   . GERD (gastroesophageal reflux disease)   . Hypercholesteremia   . Hypertension   . Myocardial infarction ([place]) [id number]  . Shortness of breath dyspnea    Past Surgical History:  Procedure Laterality Date  . CARDIAC CATHETERIZATION N/A 02/02/2016   Procedure: Left Heart Cath and Coronary Angiography;  Surgeon: [name], MD;  Location: [facility];  Service: Cardiovascular;  Laterality: N/A;  . CORONARY ANGIOPLASTY WITH STENT PLACEMENT    . CORONARY ARTERY BYPASS GRAFT  11/24/2010  . IMPLANTABLE CARDIOVERTER DEFIBRILLATOR (ICD) GENERATOR CHANGE Left 12/12/2015   Procedure: DUAL LEAD PLACEMENT CARDIAC DIFIBRILLATOR;  Surgeon: [name], MD;  Location: [facility];  Service: Cardiovascular;  Laterality: Left;  . TONSILLECTOMY     Family History  Problem Relation Age of Onset  . CAD Mother   . Cancer Mother   . Diabetes Mother   . Alzheimer's disease Father   . Cancer Father   . Heart disease Father    Social History   Tobacco Use  . Smoking status: Former Smoker    Packs/day: 2.00    Years: 30.00    Pack years: 60.00    Last attempt to quit: 12/03/1996    Years since quitting: 21.2  . Smokeless tobacco: Never Used  Substance  Use Topics  . Alcohol use: No    Alcohol/week: 0.0 oz   Allergies  Allergen Reactions  . Benadryl [Diphenhydramine] Other (See Comments)    " Hyperactivity"  . Doxycycline Swelling    Pt went into pulmonary edema.  . Lopid [Gemfibrozil] Swelling    "I gain 1 pound a day for 30 days."   Prior to Admission medications   Medication Sig Start Date End Date Taking? Authorizing Provider  albuterol (PROVENTIL HFA;VENTOLIN HFA) 108 (90 Base) MCG/ACT inhaler Inhale 2  puffs into the lungs 4 (four) times daily as needed for wheezing or shortness of breath.    Yes [provider]  albuterol-ipratropium (COMBIVENT) 18-103 MCG/ACT inhaler Inhale 1 puff into the lungs 2 times daily at 12 noon and 4 pm.   Yes [provider]  allopurinol (ZYLOPRIM) 100 MG tablet Take 100 mg by mouth daily.   Yes [provider]  atorvastatin (LIPITOR) 80 MG tablet Take 1 tablet (80 mg total) by mouth every evening. 04/21/17  Yes Theodoro [name], MD  budesonide-formoterol (SYMBICORT) 80-4.5 MCG/ACT inhaler Inhale 2 puffs into the lungs 2 (two) times daily.   Yes [provider]  calcium carbonate (TUMS - DOSED IN MG ELEMENTAL CALCIUM) 500 MG chewable tablet Chew 1 tablet by mouth as needed for indigestion or heartburn.   Yes [provider]  carvedilol (COREG) 3.125 MG tablet Take 1 tablet (3.125 mg total) by mouth 2 (two) times daily with a meal. 01/01/18  Yes Gladstone [name], MD  citalopram (CELEXA) 20 MG tablet Take 1 tablet by mouth 2 (two) times daily.   Yes [provider]  clopidogrel (PLAVIX) 75 MG tablet Take 75 mg by mouth every morning.   Yes [provider]  co-enzyme Q-10 30 MG capsule Take 30 mg by mouth daily.   Yes [provider]  ferrous sulfate 325 (65 FE) MG tablet Take 325 mg by mouth 2 (two) times daily with a meal.   Yes [provider]  finasteride (PROSCAR) 5 MG tablet Take 5 mg by mouth at bedtime.   Yes [provider]  FLUoxetine (PROZAC) 20 MG capsule Take 20 mg by mouth daily.   Yes [provider]  gabapentin (NEURONTIN) 300 MG capsule Take 300 mg by mouth 2 (two) times daily.    Yes [provider]  insulin glargine (LANTUS) 100 UNIT/ML injection Inject 0.6 mLs (60 Units total) into the skin at bedtime. 01/01/18  Yes Gladstone [name], MD  insulin lispro (HUMALOG) 100 UNIT/ML KiwkPen Inject 0-18 Units into the skin 3 (three) times daily with meals.  Per sliding scale   Yes [provider]  loratadine (CLARITIN) 10 MG tablet Take 10 mg by mouth daily.   Yes [provider]  Magnesium Oxide 400 (240 Mg) MG TABS Take 1 tablet (400 mg total) by mouth 2 (two) times daily. 02/19/18  Yes Wieting, Richard, MD  Multiple Vitamins-Minerals (CENTRUM SILVER PO) Take 1 tablet by mouth every morning.   Yes [provider]  nitroGLYCERIN (NITROSTAT) 0.4 MG SL tablet Place 0.4 mg under the tongue every 5 (five) minutes as needed for chest pain.   Yes [provider]  pantoprazole (PROTONIX) 40 MG tablet Take 80 mg by mouth 2 (two) times daily.    Yes [provider]  ramipril (ALTACE) 2.5 MG capsule Take 1 capsule (2.5 mg total) by mouth daily. 02/19/18 02/19/19 Yes Wieting, Richard, MD  ranolazine (RANEXA) 500 MG 12 hr tablet Take 500  mg by mouth 2 (two) times daily.   Yes [provider]  spironolactone (ALDACTONE) 25 MG tablet Take 12.5 mg by mouth daily.   Yes [provider]  tamsulosin (FLOMAX) 0.4 MG CAPS capsule Take 0.4 mg by mouth daily.   Yes [provider]  torsemide (DEMADEX) 20 MG tablet 1 tab po twice a day.  For weight gain of 3 lbs in one day or five lbs in a week then increase to 2 tabs in am and one in afternoon 02/19/18  Yes Loletha Grayer, MD   Review of Systems  Constitutional: Positive for fatigue. Negative for appetite change.  HENT: Negative for congestion, postnasal drip and sore throat.   Eyes: Negative.   Respiratory: Positive for shortness of breath. Negative for cough and chest tightness.   Cardiovascular: Negative for chest pain, palpitations and leg swelling.  Gastrointestinal: Negative for abdominal distention and abdominal pain.  Endocrine: Negative.   Genitourinary: Negative.   Musculoskeletal: Negative.   Skin: Negative.   Allergic/Immunologic: Negative.   Neurological: Negative for dizziness and light-headedness.  Hematological: Negative for  adenopathy. Bruises/bleeds easily.  Psychiatric/Behavioral: Negative for dysphoric mood and sleep disturbance (sleeping well with CPAP and oxygen). The patient is not nervous/anxious.    Vitals:   02/21/18 0916  BP: 137/66  Pulse: 92  Resp: 18  SpO2: 95%  Weight: 246 lb 2 oz (111.6 kg)  Height: 5\' 8"  (1.727 m)   Wt Readings from Last 3 Encounters:  02/21/18 246 lb 2 oz (111.6 kg)  02/19/18 242 lb 12.8 oz (110.1 kg)  02/13/18 251 lb 8 oz (114.1 kg)   Lab Results  Component Value Date   CREATININE 1.35 (H) 02/19/2018   CREATININE 1.24 02/18/2018   CREATININE 1.07 02/12/2018    Physical Exam  Constitutional: He is oriented to person, place, and time. He appears well-developed and well-nourished.  HENT:  Head: Normocephalic and atraumatic.  Neck: Normal range of motion. Neck supple. No JVD present.  Cardiovascular: Normal rate and regular rhythm.  Pulmonary/Chest: Effort normal. He has no wheezes. He has no rales.  Abdominal: He exhibits distension. There is no tenderness.  Musculoskeletal: He exhibits no edema or tenderness.  Neurological: He is alert and oriented to person, place, and time.  Skin: Skin is warm and dry.  Psychiatric: He has a normal mood and affect. His behavior is normal. Thought content normal.  Nursing note and vitals reviewed.  Assessment & Plan:  1: Chronic heart failure with reduced ejection fraction-  - NYHA class II - mildly fluid overloaded today with abdominal distention  - has been weighing daily and home weight chart was reviewed. Had 1 night prior to hospitalization where he gained 2 pounds overnight.  Reminded to call for an overnight weight gain of 2 pounds or > or a weekly weight gain of >5 pounds - weight down 5 pounds since 02/13/18 - not adding salt but does eat fast food often. Discussed the importance of closely following a 2000mg  sodium diet and to limit fast food - says that he usually eats breakfast out and ate at [employer] yesterday.  Discussed the high sodium content of those foods - says that he has a salt based water softener at home and was wondering if he's getting any salt that way. Discussed that I was unsure but that he could try just drinking bottled water for a few weeks to see if that makes any difference.  - currently taking torsemide 20mg  BID. Instructed to take  an additional 20mg  torsemide in the morning for a weight gain of 2 pounds or > overnight, edema or worsening shortness of breath - saw cardiology Nehemiah Massed) 01/05/18 & returns 03/06/18 - BNP from 02/18/18 was 805.0 - discussed that we could give IV furosemide at same day surgery if needed for the future  2: HTN- - BP looks good today - saw PCP (Tumey) 01/30/18 & returns to [name] 02/22/18 - BMP from 02/19/18 reviewed and showed sodium 141, potassium 4.0 and GFR 50   3: Obstructive sleep apnea-   - wearing CPAP nightly along with oxygen at 5L - reports sleeping well  4: Diabetes-  - fasting glucose at home this morning was 198 - says that he ate a pimento cheese sandwich for supper last night - A1c on 12/05/17 was 5.9%  Patient did not bring his medications nor a list.   Return in 1 week or sooner for any questions/problems before then.

## 2018-02-22 DIAGNOSIS — N289 Disorder of kidney and ureter, unspecified: Secondary | ICD-10-CM | POA: Diagnosis not present

## 2018-02-22 DIAGNOSIS — I5023 Acute on chronic systolic (congestive) heart failure: Secondary | ICD-10-CM | POA: Diagnosis not present

## 2018-02-27 NOTE — Progress Notes (Signed)
Patient ID: [name], male    DOB: 1946/03/30, 72 y.o.   MRN: [id number]  HPI  [name] is a 72 y/o male with a history of prostate cancer, DM, hyperlipidemia, HTN, anemia, COPD, GERD, MI, previous tobacco use and chronic heart failure.   Echo report from 12/05/17 reviewed and showed an EF of 20-25% along with mild Daniel and moderate Daniel. Cardiac catheterization done 02/02/16 showed severed 3-vessel CAD with patent grafts. Continue medication management along with dual antiplatelet therapy.   Admitted 02/18/18 due to HF exacerbation. Cardiology consult obtained. Initially given IV lasix and then transitioned to oral diuretics. Needed oxygen and bipap initially as well. Supplemented with magnesium and discharged the following day per patient's request.             Was in the ED 02/12/18 due to HF exacerbation. He had missed his most recent HF Clinic appointment. Given one dose of IV lasix and released. Admitted 12/30/17 due to HF exacerbation. Initially needed IV lasix and then transitioned to oral diuretics. Needed bipap initially as well. Discharged after 2 days. Admitted 12/05/17 due to HF exacerbation. Cardiology consult obtained. Elevated troponins thought to be due to demand ischemia. Discharged the following day. Was in the ED 12/04/17 with chest pain where he was evaluated and released. Was in the ED 11/22/17 due to gout where he was treated and released.   He presents today for a follow-up visit with a chief complaint of minimal shortness of breath upon moderate exertion. He describes this as chronic in nature having been present for several years. He has associated fatigue and easy bruising along with this. He denies any difficulty sleeping, abdominal distention, palpitations, edema, chest pain, dizziness, cough or weight gain.   Past Medical History:  Diagnosis Date  . Anemia   . Cancer ([place]) 12/2013   prostate  . Cardiogenic pulmonary edema ([place]) 12/19/2014  . Cardiomyopathy, ischemic    . CHF (congestive heart failure) ([place])   . COPD (chronic obstructive pulmonary disease) ([place])   . Diabetes mellitus without complication ([place])   . GERD (gastroesophageal reflux disease)   . Hypercholesteremia   . Hypertension   . Myocardial infarction ([place]) [id number]  . Shortness of breath dyspnea    Past Surgical History:  Procedure Laterality Date  . CARDIAC CATHETERIZATION N/A 02/02/2016   Procedure: Left Heart Cath and Coronary Angiography;  Surgeon: [name], MD;  Location: [facility];  Service: Cardiovascular;  Laterality: N/A;  . CORONARY ANGIOPLASTY WITH STENT PLACEMENT    . CORONARY ARTERY BYPASS GRAFT  11/24/2010  . IMPLANTABLE CARDIOVERTER DEFIBRILLATOR (ICD) GENERATOR CHANGE Left 12/12/2015   Procedure: DUAL LEAD PLACEMENT CARDIAC DIFIBRILLATOR;  Surgeon: [name], MD;  Location: [facility];  Service: Cardiovascular;  Laterality: Left;  . TONSILLECTOMY     Family History  Problem Relation Age of Onset  . CAD Mother   . Cancer Mother   . Diabetes Mother   . Alzheimer's disease Father   . Cancer Father   . Heart disease Father    Social History   Tobacco Use  . Smoking status: Former Smoker    Packs/day: 2.00    Years: 30.00    Pack years: 60.00    Last attempt to quit: 12/03/1996    Years since quitting: 21.2  . Smokeless tobacco: Never Used  Substance Use Topics  . Alcohol use: No    Alcohol/week: 0.0 oz   Allergies  Allergen Reactions  . Benadryl [  Diphenhydramine] Other (See Comments)    " Hyperactivity"  . Doxycycline Swelling    Pt went into pulmonary edema.  . Lopid [Gemfibrozil] Swelling    "I gain 1 pound a day for 30 days."   Prior to Admission medications   Medication Sig Start Date End Date Taking? Authorizing Provider  albuterol (PROVENTIL HFA;VENTOLIN HFA) 108 (90 Base) MCG/ACT inhaler Inhale 2 puffs into the lungs 4 (four) times daily as needed for wheezing or shortness of breath.    Yes [provider]   albuterol-ipratropium (COMBIVENT) 18-103 MCG/ACT inhaler Inhale 1 puff into the lungs 2 times daily at 12 noon and 4 pm.   Yes [provider]  allopurinol (ZYLOPRIM) 100 MG tablet Take 100 mg by mouth daily.   Yes [provider]  atorvastatin (LIPITOR) 80 MG tablet Take 1 tablet (80 mg total) by mouth every evening. 04/21/17  Yes Theodoro [name], MD  budesonide-formoterol (SYMBICORT) 80-4.5 MCG/ACT inhaler Inhale 2 puffs into the lungs 2 (two) times daily.   Yes [provider]  calcium carbonate (TUMS - DOSED IN MG ELEMENTAL CALCIUM) 500 MG chewable tablet Chew 1 tablet by mouth as needed for indigestion or heartburn.   Yes [provider]  carvedilol (COREG) 3.125 MG tablet Take 1 tablet (3.125 mg total) by mouth 2 (two) times daily with a meal. 01/01/18  Yes Gladstone [name], MD  citalopram (CELEXA) 20 MG tablet Take 1 tablet by mouth 2 (two) times daily.   Yes [provider]  clopidogrel (PLAVIX) 75 MG tablet Take 75 mg by mouth every morning.   Yes [provider]  co-enzyme Q-10 30 MG capsule Take 30 mg by mouth daily.   Yes [provider]  ferrous sulfate 325 (65 FE) MG tablet Take 325 mg by mouth 2 (two) times daily with a meal.   Yes [provider]  finasteride (PROSCAR) 5 MG tablet Take 5 mg by mouth at bedtime.   Yes [provider]  FLUoxetine (PROZAC) 20 MG capsule Take 20 mg by mouth daily.   Yes [provider]  gabapentin (NEURONTIN) 300 MG capsule Take 300 mg by mouth 2 (two) times daily.    Yes [provider]  insulin glargine (LANTUS) 100 UNIT/ML injection Inject 0.6 mLs (60 Units total) into the skin at bedtime. 01/01/18  Yes Gladstone [name], MD  insulin lispro (HUMALOG) 100 UNIT/ML KiwkPen Inject 0-18 Units into the skin 3 (three) times daily with meals. Per sliding scale   Yes [provider]  loratadine (CLARITIN) 10 MG tablet Take 10 mg by mouth daily.   Yes  [provider]  Magnesium Oxide 400 (240 Mg) MG TABS Take 1 tablet (400 mg total) by mouth 2 (two) times daily. 02/19/18  Yes Wieting, Richard, MD  Multiple Vitamins-Minerals (CENTRUM SILVER PO) Take 1 tablet by mouth every morning.   Yes [provider]  nitroGLYCERIN (NITROSTAT) 0.4 MG SL tablet Place 0.4 mg under the tongue every 5 (five) minutes as needed for chest pain.   Yes [provider]  pantoprazole (PROTONIX) 40 MG tablet Take 80 mg by mouth 2 (two) times daily.    Yes [provider]  ramipril (ALTACE) 2.5 MG capsule Take 1 capsule (2.5 mg total) by mouth daily. 02/19/18 02/19/19 Yes Wieting, Richard, MD  ranolazine (RANEXA) 500 MG 12 hr tablet Take 500 mg by mouth 2 (two) times daily.   Yes [provider]  spironolactone (ALDACTONE) 25 MG tablet Take 12.5 mg  by mouth daily.   Yes [provider]  tamsulosin (FLOMAX) 0.4 MG CAPS capsule Take 0.4 mg by mouth daily.   Yes [provider]  torsemide (DEMADEX) 20 MG tablet 1 tab po twice a day.  For weight gain of 3 lbs in one day or five lbs in a week then increase to 2 tabs in am and one in afternoon 02/19/18  Yes Loletha Grayer, MD    Review of Systems  Constitutional: Positive for fatigue. Negative for appetite change.  HENT: Negative for congestion, postnasal drip and sore throat.   Eyes: Negative.   Respiratory: Positive for shortness of breath. Negative for cough and chest tightness.   Cardiovascular: Negative for chest pain, palpitations and leg swelling.  Gastrointestinal: Negative for abdominal distention and abdominal pain.  Endocrine: Negative.   Genitourinary: Negative.   Musculoskeletal: Negative.   Skin: Negative.   Allergic/Immunologic: Negative.   Neurological: Negative for dizziness and light-headedness.  Hematological: Negative for adenopathy. Bruises/bleeds easily.  Psychiatric/Behavioral: Negative for dysphoric mood and sleep disturbance (sleeping  well with CPAP and oxygen). The patient is not nervous/anxious.    Vitals:   03/01/18 1248  BP: 136/61  Pulse: 83  Resp: 18  SpO2: 100%  Weight: 247 lb (112 kg)  Height: 5\' 8"  (1.727 m)   Wt Readings from Last 3 Encounters:  03/01/18 247 lb (112 kg)  02/21/18 246 lb 2 oz (111.6 kg)  02/19/18 242 lb 12.8 oz (110.1 kg)   Lab Results  Component Value Date   CREATININE 1.35 (H) 02/19/2018   CREATININE 1.24 02/18/2018   CREATININE 1.07 02/12/2018   Physical Exam  Constitutional: He is oriented to person, place, and time. He appears well-developed and well-nourished.  HENT:  Head: Normocephalic and atraumatic.  Neck: Normal range of motion. Neck supple. No JVD present.  Cardiovascular: Normal rate and regular rhythm.  Pulmonary/Chest: Effort normal. He has no wheezes. He has no rales.  Abdominal: He exhibits no distension. There is no tenderness.  Musculoskeletal: He exhibits no edema or tenderness.  Neurological: He is alert and oriented to person, place, and time.  Skin: Skin is warm and dry.  Psychiatric: He has a normal mood and affect. His behavior is normal. Thought content normal.  Nursing note and vitals reviewed.  Assessment & Plan:  1: Chronic heart failure with reduced ejection fraction-  - NYHA class II - euvolemic today - has been weighing daily and home weight chart was reviewed. Reminded to call for an overnight weight gain of 2 pounds or > or a weekly weight gain of >5 pounds - weight stable from last week - not adding salt but does eat fast food often. Discussed the importance of closely following a 2000mg  sodium diet and to limit fast food - currently taking torsemide 20mg  BID. Instructed to take an additional 20mg  torsemide in the morning for a weight gain of 2 pounds or > overnight, edema or worsening shortness of breath - saw cardiology Nehemiah Massed) 01/05/18 & returns 03/06/18 - BNP from 02/18/18 was 805.0 - discussed that we could give IV furosemide at same  day surgery if needed for in the future  2: HTN- - BP looks good today - saw PCP (Tumey) 02/16/18 and returns 03/07/18 - BMP from 02/19/18 reviewed and showed sodium 141, potassium 4.0 and GFR 50   3: Obstructive sleep apnea-   - wearing CPAP nightly along with oxygen at 5L - reports sleeping well  4: Diabetes-  - fasting glucose at  home this morning was 140 - A1c on 12/05/17 was 5.9%  Patient did not bring his medications nor a list. Each medication was verbally reviewed with the patient and he was encouraged to bring the bottles to every visit to confirm accuracy of list.   Return in 3 weeks or sooner for any questions/problems before then.

## 2018-03-01 ENCOUNTER — Ambulatory Visit: Payer: PPO | Attending: Family | Admitting: Family

## 2018-03-01 ENCOUNTER — Encounter: Payer: Self-pay | Admitting: Family

## 2018-03-01 VITALS — BP 136/61 | HR 83 | Resp 18 | Ht 68.0 in | Wt 247.0 lb

## 2018-03-01 DIAGNOSIS — I10 Essential (primary) hypertension: Secondary | ICD-10-CM

## 2018-03-01 DIAGNOSIS — Z809 Family history of malignant neoplasm, unspecified: Secondary | ICD-10-CM | POA: Insufficient documentation

## 2018-03-01 DIAGNOSIS — Z87891 Personal history of nicotine dependence: Secondary | ICD-10-CM | POA: Insufficient documentation

## 2018-03-01 DIAGNOSIS — Z8546 Personal history of malignant neoplasm of prostate: Secondary | ICD-10-CM | POA: Insufficient documentation

## 2018-03-01 DIAGNOSIS — Z951 Presence of aortocoronary bypass graft: Secondary | ICD-10-CM | POA: Diagnosis not present

## 2018-03-01 DIAGNOSIS — K219 Gastro-esophageal reflux disease without esophagitis: Secondary | ICD-10-CM | POA: Diagnosis not present

## 2018-03-01 DIAGNOSIS — Z7902 Long term (current) use of antithrombotics/antiplatelets: Secondary | ICD-10-CM | POA: Diagnosis not present

## 2018-03-01 DIAGNOSIS — I252 Old myocardial infarction: Secondary | ICD-10-CM | POA: Insufficient documentation

## 2018-03-01 DIAGNOSIS — E119 Type 2 diabetes mellitus without complications: Secondary | ICD-10-CM | POA: Diagnosis not present

## 2018-03-01 DIAGNOSIS — D649 Anemia, unspecified: Secondary | ICD-10-CM | POA: Insufficient documentation

## 2018-03-01 DIAGNOSIS — Z833 Family history of diabetes mellitus: Secondary | ICD-10-CM | POA: Diagnosis not present

## 2018-03-01 DIAGNOSIS — G4733 Obstructive sleep apnea (adult) (pediatric): Secondary | ICD-10-CM | POA: Insufficient documentation

## 2018-03-01 DIAGNOSIS — Z881 Allergy status to other antibiotic agents status: Secondary | ICD-10-CM | POA: Insufficient documentation

## 2018-03-01 DIAGNOSIS — Z794 Long term (current) use of insulin: Secondary | ICD-10-CM | POA: Insufficient documentation

## 2018-03-01 DIAGNOSIS — Z9889 Other specified postprocedural states: Secondary | ICD-10-CM | POA: Insufficient documentation

## 2018-03-01 DIAGNOSIS — Z79899 Other long term (current) drug therapy: Secondary | ICD-10-CM | POA: Diagnosis not present

## 2018-03-01 DIAGNOSIS — Z888 Allergy status to other drugs, medicaments and biological substances status: Secondary | ICD-10-CM | POA: Diagnosis not present

## 2018-03-01 DIAGNOSIS — Z8249 Family history of ischemic heart disease and other diseases of the circulatory system: Secondary | ICD-10-CM | POA: Insufficient documentation

## 2018-03-01 DIAGNOSIS — I255 Ischemic cardiomyopathy: Secondary | ICD-10-CM | POA: Insufficient documentation

## 2018-03-01 DIAGNOSIS — I2510 Atherosclerotic heart disease of native coronary artery without angina pectoris: Secondary | ICD-10-CM | POA: Diagnosis not present

## 2018-03-01 DIAGNOSIS — J449 Chronic obstructive pulmonary disease, unspecified: Secondary | ICD-10-CM | POA: Insufficient documentation

## 2018-03-01 DIAGNOSIS — Z955 Presence of coronary angioplasty implant and graft: Secondary | ICD-10-CM | POA: Insufficient documentation

## 2018-03-01 DIAGNOSIS — I110 Hypertensive heart disease with heart failure: Secondary | ICD-10-CM | POA: Diagnosis not present

## 2018-03-01 DIAGNOSIS — I5022 Chronic systolic (congestive) heart failure: Secondary | ICD-10-CM | POA: Diagnosis not present

## 2018-03-01 DIAGNOSIS — E7800 Pure hypercholesterolemia, unspecified: Secondary | ICD-10-CM | POA: Diagnosis not present

## 2018-03-01 DIAGNOSIS — E1143 Type 2 diabetes mellitus with diabetic autonomic (poly)neuropathy: Secondary | ICD-10-CM

## 2018-03-01 NOTE — Patient Instructions (Signed)
Continue weighing daily and call for an overnight weight gain of > 2 pounds or a weekly weight gain of >5 pounds. 

## 2018-03-02 ENCOUNTER — Encounter: Payer: Self-pay | Admitting: Family

## 2018-03-07 DIAGNOSIS — I10 Essential (primary) hypertension: Secondary | ICD-10-CM | POA: Diagnosis not present

## 2018-03-07 DIAGNOSIS — Z0000 Encounter for general adult medical examination without abnormal findings: Secondary | ICD-10-CM | POA: Diagnosis not present

## 2018-03-07 DIAGNOSIS — Z136 Encounter for screening for cardiovascular disorders: Secondary | ICD-10-CM | POA: Diagnosis not present

## 2018-03-07 DIAGNOSIS — D638 Anemia in other chronic diseases classified elsewhere: Secondary | ICD-10-CM | POA: Diagnosis not present

## 2018-03-07 DIAGNOSIS — E669 Obesity, unspecified: Secondary | ICD-10-CM | POA: Diagnosis not present

## 2018-03-13 DIAGNOSIS — G4733 Obstructive sleep apnea (adult) (pediatric): Secondary | ICD-10-CM | POA: Diagnosis not present

## 2018-03-13 DIAGNOSIS — I25810 Atherosclerosis of coronary artery bypass graft(s) without angina pectoris: Secondary | ICD-10-CM | POA: Diagnosis not present

## 2018-03-13 DIAGNOSIS — I5022 Chronic systolic (congestive) heart failure: Secondary | ICD-10-CM | POA: Diagnosis not present

## 2018-03-13 DIAGNOSIS — Z9989 Dependence on other enabling machines and devices: Secondary | ICD-10-CM | POA: Diagnosis not present

## 2018-03-13 DIAGNOSIS — I255 Ischemic cardiomyopathy: Secondary | ICD-10-CM | POA: Diagnosis not present

## 2018-03-13 DIAGNOSIS — I10 Essential (primary) hypertension: Secondary | ICD-10-CM | POA: Diagnosis not present

## 2018-03-17 NOTE — Progress Notes (Signed)
Patient ID: Daniel [name] [name], male    DOB: Dec 30, 1945, 72 y.o.   MRN: [id number]  HPI  Daniel [name] [name] is a 71 y/o male with a history of prostate cancer, DM, hyperlipidemia, HTN, anemia, COPD, GERD, MI, previous tobacco use and chronic heart failure.   Echo report from 12/05/17 reviewed and showed [name] of 20-25% along with mild Daniel [name] and moderate Daniel [name]. Cardiac catheterization done 02/02/16 showed severed 3-vessel CAD with patent grafts. Continue medication management along with dual antiplatelet therapy.   Admitted 02/18/18 due to HF exacerbation. Cardiology consult obtained. Initially given IV lasix and then transitioned to oral diuretics. Needed oxygen and bipap initially as well. Supplemented with magnesium and discharged the following day per patient's request.             Was in the ED 02/12/18 due to HF exacerbation. He had missed his most recent HF Clinic appointment. Given one dose of IV lasix and released. Admitted 12/30/17 due to HF exacerbation. Initially needed IV lasix and then transitioned to oral diuretics. Needed bipap initially as well. Discharged after 2 days. Admitted 12/05/17 due to HF exacerbation. Cardiology consult obtained. Elevated troponins thought to be due to demand ischemia. Discharged the following day. Was in the ED 12/04/17 with chest pain where he was evaluated and released. Was in the ED 11/22/17 due to gout where he was treated and released.   He presents today for a follow-up visit with a chief complaint of minimal shortness of breath upon moderate exertion. He describes this as chronic in nature having been present for several years. He has associated fatigue and easy bruising along with this. He denies any difficulty sleeping, abdominal distention, palpitations, edema, chest pain, cough, dizziness or weight gain. No longer drinking soda and is drinking flavor infused water instead due to sodium content of the soda. Does notice some swelling over the right wrist joint along with  a knot. No injury that he's aware of.   Past Medical History:  Diagnosis Date  . Anemia   . Cancer ([place]) 12/2013   prostate  . Cardiogenic pulmonary edema ([place]) 12/19/2014  . Cardiomyopathy, ischemic   . CHF (congestive heart failure) ([place])   . COPD (chronic obstructive pulmonary disease) ([place])   . Diabetes mellitus without complication ([place])   . GERD (gastroesophageal reflux disease)   . Hypercholesteremia   . Hypertension   . Myocardial infarction ([place]) [id number]  . Shortness of breath dyspnea    Past Surgical History:  Procedure Laterality Date  . CARDIAC CATHETERIZATION N/A 02/02/2016   Procedure: Left Heart Cath and Coronary Angiography;  Surgeon: [name], MD;  Location: [facility];  Service: Cardiovascular;  Laterality: N/A;  . CORONARY ANGIOPLASTY WITH STENT PLACEMENT    . CORONARY ARTERY BYPASS GRAFT  11/24/2010  . IMPLANTABLE CARDIOVERTER DEFIBRILLATOR (ICD) GENERATOR CHANGE Left 12/12/2015   Procedure: DUAL LEAD PLACEMENT CARDIAC DIFIBRILLATOR;  Surgeon: [name], MD;  Location: [facility];  Service: Cardiovascular;  Laterality: Left;  . TONSILLECTOMY     Family History  Problem Relation Age of Onset  . CAD Mother   . Cancer Mother   . Diabetes Mother   . Alzheimer's disease Father   . Cancer Father   . Heart disease Father    Social History   Tobacco Use  . Smoking status: Former Smoker    Packs/day: 2.00    Years: 30.00    Pack years: 60.00    Last attempt to quit: 12/03/1996  Years since quitting: 21.2  . Smokeless tobacco: Never Used  Substance Use Topics  . Alcohol use: No    Alcohol/week: 0.0 oz   Allergies  Allergen Reactions  . Benadryl [Diphenhydramine] Other (See Comments)    " Hyperactivity"  . Doxycycline Swelling    Pt went into pulmonary edema.  . Lopid [Gemfibrozil] Swelling    "I gain 1 pound a day for 30 days."   Prior to Admission medications   Medication Sig Start Date End Date Taking? Authorizing  Provider  albuterol (PROVENTIL HFA;VENTOLIN HFA) 108 (90 Base) MCG/ACT inhaler Inhale 2 puffs into the lungs 4 (four) times daily as needed for wheezing or shortness of breath.    Yes [provider]  albuterol-ipratropium (COMBIVENT) 18-103 MCG/ACT inhaler Inhale 1 puff into the lungs 2 times daily at 12 noon and 4 pm.   Yes [provider]  allopurinol (ZYLOPRIM) 100 MG tablet Take 100 mg by mouth daily.   Yes [provider]  atorvastatin (LIPITOR) 80 MG tablet Take 1 tablet (80 mg total) by mouth every evening. 04/21/17  Yes Theodoro [name], MD  budesonide-formoterol (SYMBICORT) 80-4.5 MCG/ACT inhaler Inhale 2 puffs into the lungs 2 (two) times daily.   Yes [provider]  calcium carbonate (TUMS - DOSED IN MG ELEMENTAL CALCIUM) 500 MG chewable tablet Chew 1 tablet by mouth as needed for indigestion or heartburn.   Yes [provider]  carvedilol (COREG) 3.125 MG tablet Take 1 tablet (3.125 mg total) by mouth 2 (two) times daily with a meal. 01/01/18  Yes Gladstone [name], MD  citalopram (CELEXA) 20 MG tablet Take 1 tablet by mouth 2 (two) times daily.   Yes [provider]  clopidogrel (PLAVIX) 75 MG tablet Take 75 mg by mouth every morning.   Yes [provider]  co-enzyme Q-10 30 MG capsule Take 30 mg by mouth daily.   Yes [provider]  ferrous sulfate 325 (65 FE) MG tablet Take 325 mg by mouth 2 (two) times daily with a meal.   Yes [provider]  finasteride (PROSCAR) 5 MG tablet Take 5 mg by mouth at bedtime.   Yes [provider]  FLUoxetine (PROZAC) 20 MG capsule Take 20 mg by mouth daily.   Yes [provider]  gabapentin (NEURONTIN) 300 MG capsule Take 300 mg by mouth 2 (two) times daily.    Yes [provider]  insulin glargine (LANTUS) 100 UNIT/ML injection Inject 0.6 mLs (60 Units total) into the skin at bedtime. Patient taking differently: Inject 100 Units into the skin at  bedtime.  01/01/18  Yes Gladstone [name], MD  insulin lispro (HUMALOG) 100 UNIT/ML KiwkPen Inject 0-18 Units into the skin 3 (three) times daily with meals. Per sliding scale   Yes [provider]  loratadine (CLARITIN) 10 MG tablet Take 10 mg by mouth daily.   Yes [provider]  Magnesium Oxide 400 (240 Mg) MG TABS Take 1 tablet (400 mg total) by mouth 2 (two) times daily. 02/19/18  Yes Wieting, Richard, MD  Multiple Vitamins-Minerals (CENTRUM SILVER PO) Take 1 tablet by mouth every morning.   Yes [provider]  nitroGLYCERIN (NITROSTAT) 0.4 MG SL tablet Place 0.4 mg under the tongue every 5 (five) minutes as needed for chest pain.   Yes [provider]  pantoprazole (PROTONIX) 40 MG tablet Take 80 mg by mouth 2 (two) times daily.    Yes [provider]  ramipril (ALTACE) 2.5 MG capsule Take  1 capsule (2.5 mg total) by mouth daily. 02/19/18 02/19/19 Yes Wieting, Richard, MD  ranolazine (RANEXA) 500 MG 12 hr tablet Take 500 mg by mouth 2 (two) times daily.   Yes [provider]  spironolactone (ALDACTONE) 25 MG tablet Take 12.5 mg by mouth daily.   Yes [provider]  tamsulosin (FLOMAX) 0.4 MG CAPS capsule Take 0.4 mg by mouth daily.   Yes [provider]  torsemide (DEMADEX) 20 MG tablet 1 tab po twice a day.  For weight gain of 3 lbs in one day or five lbs in a week then increase to 2 tabs in am and one in afternoon 02/19/18  Yes Loletha Grayer, MD    Review of Systems  Constitutional: Positive for fatigue. Negative for appetite change.  HENT: Negative for congestion, postnasal drip and sore throat.   Eyes: Negative.   Respiratory: Positive for shortness of breath. Negative for cough and chest tightness.   Cardiovascular: Negative for chest pain, palpitations and leg swelling.  Gastrointestinal: Negative for abdominal distention and abdominal pain.  Endocrine: Negative.   Genitourinary: Negative.   Musculoskeletal:  Negative.   Skin: Negative.   Allergic/Immunologic: Negative.   Neurological: Negative for dizziness and light-headedness.  Hematological: Negative for adenopathy. Bruises/bleeds easily.  Psychiatric/Behavioral: Negative for dysphoric mood and sleep disturbance (sleeping well with CPAP and oxygen). The patient is not nervous/anxious.    Vitals:   03/21/18 0953  BP: 132/60  Pulse: 76  Resp: 18  SpO2: 100%  Weight: 245 lb 4 oz (111.2 kg)  Height: 5\' 6"  (1.676 m)   Wt Readings from Last 3 Encounters:  03/21/18 245 lb 4 oz (111.2 kg)  03/01/18 247 lb (112 kg)  02/21/18 246 lb 2 oz (111.6 kg)   Lab Results  Component Value Date   CREATININE 1.35 (H) 02/19/2018   CREATININE 1.24 02/18/2018   CREATININE 1.07 02/12/2018    Physical Exam  Constitutional: He is oriented to person, place, and time. He appears well-developed and well-nourished.  HENT:  Head: Normocephalic and atraumatic.  Neck: Normal range of motion. Neck supple. No JVD present.  Cardiovascular: Normal rate and regular rhythm.  Pulmonary/Chest: Effort normal. He has no wheezes. He has no rales.  Abdominal: He exhibits no distension. There is no tenderness.  Musculoskeletal: He exhibits no edema or tenderness.  Neurological: He is alert and oriented to person, place, and time.  Skin: Skin is warm and dry.  Psychiatric: He has a normal mood and affect. His behavior is normal. Thought content normal.  Nursing note and vitals reviewed.  Assessment & Plan:  1: Chronic heart failure with reduced ejection fraction-  - NYHA class II - euvolemic today - has been weighing daily and home weight chart was reviewed. Reminded to call for [name] overnight weight gain of 2 pounds or > or a weekly weight gain of >5 pounds - weight down 2 pounds from 03/01/18 - not adding salt but does eat fast food often. Discussed the importance of closely following a 2000mg  sodium diet and to limit fast food - currently taking torsemide 20mg  BID.  Instructed to take [name] additional 20mg  torsemide in the morning for a weight gain of 2 pounds or > overnight, edema or worsening shortness of breath - says that he had one overnight weight gain of >2 pounds and he took [name] extra torsemide with reduction in weight the next day - saw cardiology Daniel [name] [name]) 03/13/18 and returns ~ June 2019 - BNP from 02/18/18 was 805.0 -  discussed that we could give IV furosemide at same day surgery if needed for in the future - PharmD reconciled medications with the patient and his wife  2: HTN- - BP looks good today - saw PCP ([place]) 03/07/18 - BMP from 02/22/18 reviewed and showed sodium 136, potassium 4.2 and GFR 50; says that he's getting labs today for his PCP   3: Obstructive sleep apnea-   - wearing CPAP nightly along with oxygen at 5L - reports sleeping well  4: Diabetes-  - fasting glucose at home this morning was 138 - A1c on 12/05/17 was 5.9% - sees endocrinologist ([place]) 03/28/18  Patient did not bring his medications nor a list. Each medication was verbally reviewed with the patient and he was encouraged to bring the bottles to every visit to confirm accuracy of list.   Return in 2 months or sooner for any questions/problems before then.

## 2018-03-21 ENCOUNTER — Encounter: Payer: Self-pay | Admitting: Family

## 2018-03-21 ENCOUNTER — Ambulatory Visit: Payer: PPO | Attending: Family | Admitting: Family

## 2018-03-21 VITALS — BP 132/60 | HR 76 | Resp 18 | Ht 66.0 in | Wt 245.2 lb

## 2018-03-21 DIAGNOSIS — I252 Old myocardial infarction: Secondary | ICD-10-CM | POA: Insufficient documentation

## 2018-03-21 DIAGNOSIS — J449 Chronic obstructive pulmonary disease, unspecified: Secondary | ICD-10-CM | POA: Insufficient documentation

## 2018-03-21 DIAGNOSIS — D649 Anemia, unspecified: Secondary | ICD-10-CM | POA: Diagnosis not present

## 2018-03-21 DIAGNOSIS — E1143 Type 2 diabetes mellitus with diabetic autonomic (poly)neuropathy: Secondary | ICD-10-CM

## 2018-03-21 DIAGNOSIS — I5022 Chronic systolic (congestive) heart failure: Secondary | ICD-10-CM | POA: Insufficient documentation

## 2018-03-21 DIAGNOSIS — I255 Ischemic cardiomyopathy: Secondary | ICD-10-CM | POA: Insufficient documentation

## 2018-03-21 DIAGNOSIS — Z79899 Other long term (current) drug therapy: Secondary | ICD-10-CM | POA: Diagnosis not present

## 2018-03-21 DIAGNOSIS — G4733 Obstructive sleep apnea (adult) (pediatric): Secondary | ICD-10-CM | POA: Insufficient documentation

## 2018-03-21 DIAGNOSIS — E7800 Pure hypercholesterolemia, unspecified: Secondary | ICD-10-CM | POA: Diagnosis not present

## 2018-03-21 DIAGNOSIS — I10 Essential (primary) hypertension: Secondary | ICD-10-CM | POA: Diagnosis not present

## 2018-03-21 DIAGNOSIS — I110 Hypertensive heart disease with heart failure: Secondary | ICD-10-CM | POA: Diagnosis not present

## 2018-03-21 DIAGNOSIS — Z8546 Personal history of malignant neoplasm of prostate: Secondary | ICD-10-CM | POA: Insufficient documentation

## 2018-03-21 DIAGNOSIS — E785 Hyperlipidemia, unspecified: Secondary | ICD-10-CM | POA: Diagnosis not present

## 2018-03-21 DIAGNOSIS — N183 Chronic kidney disease, stage 3 (moderate): Secondary | ICD-10-CM | POA: Diagnosis not present

## 2018-03-21 DIAGNOSIS — D638 Anemia in other chronic diseases classified elsewhere: Secondary | ICD-10-CM | POA: Diagnosis not present

## 2018-03-21 DIAGNOSIS — I2510 Atherosclerotic heart disease of native coronary artery without angina pectoris: Secondary | ICD-10-CM | POA: Insufficient documentation

## 2018-03-21 DIAGNOSIS — Z87891 Personal history of nicotine dependence: Secondary | ICD-10-CM | POA: Diagnosis not present

## 2018-03-21 DIAGNOSIS — E119 Type 2 diabetes mellitus without complications: Secondary | ICD-10-CM | POA: Diagnosis not present

## 2018-03-21 DIAGNOSIS — Z8249 Family history of ischemic heart disease and other diseases of the circulatory system: Secondary | ICD-10-CM | POA: Diagnosis not present

## 2018-03-21 DIAGNOSIS — E1142 Type 2 diabetes mellitus with diabetic polyneuropathy: Secondary | ICD-10-CM | POA: Diagnosis not present

## 2018-03-21 DIAGNOSIS — Z794 Long term (current) use of insulin: Secondary | ICD-10-CM | POA: Diagnosis not present

## 2018-03-21 DIAGNOSIS — K219 Gastro-esophageal reflux disease without esophagitis: Secondary | ICD-10-CM | POA: Diagnosis not present

## 2018-03-21 DIAGNOSIS — Z136 Encounter for screening for cardiovascular disorders: Secondary | ICD-10-CM | POA: Diagnosis not present

## 2018-03-21 NOTE — Patient Instructions (Signed)
Continue weighing daily and call for an overnight weight gain of > 2 pounds or a weekly weight gain of >5 pounds. 

## 2018-03-22 DIAGNOSIS — E1142 Type 2 diabetes mellitus with diabetic polyneuropathy: Secondary | ICD-10-CM | POA: Diagnosis not present

## 2018-03-28 DIAGNOSIS — Z794 Long term (current) use of insulin: Secondary | ICD-10-CM | POA: Diagnosis not present

## 2018-03-28 DIAGNOSIS — E1159 Type 2 diabetes mellitus with other circulatory complications: Secondary | ICD-10-CM | POA: Diagnosis not present

## 2018-03-28 DIAGNOSIS — E1142 Type 2 diabetes mellitus with diabetic polyneuropathy: Secondary | ICD-10-CM | POA: Diagnosis not present

## 2018-03-28 DIAGNOSIS — N183 Chronic kidney disease, stage 3 (moderate): Secondary | ICD-10-CM | POA: Diagnosis not present

## 2018-03-28 DIAGNOSIS — E1122 Type 2 diabetes mellitus with diabetic chronic kidney disease: Secondary | ICD-10-CM | POA: Diagnosis not present

## 2018-04-05 DIAGNOSIS — E119 Type 2 diabetes mellitus without complications: Secondary | ICD-10-CM | POA: Diagnosis not present

## 2018-05-15 IMAGING — DX DG CHEST 1V PORT
1 series · 1 of 1 positions shown · non-contrast
Comparison: Chest radiograph performed 06/30/2017

CLINICAL DATA: Acute onset of shortness of breath. Initial
encounter.

EXAM:
PORTABLE CHEST 1 VIEW

[chest ap]
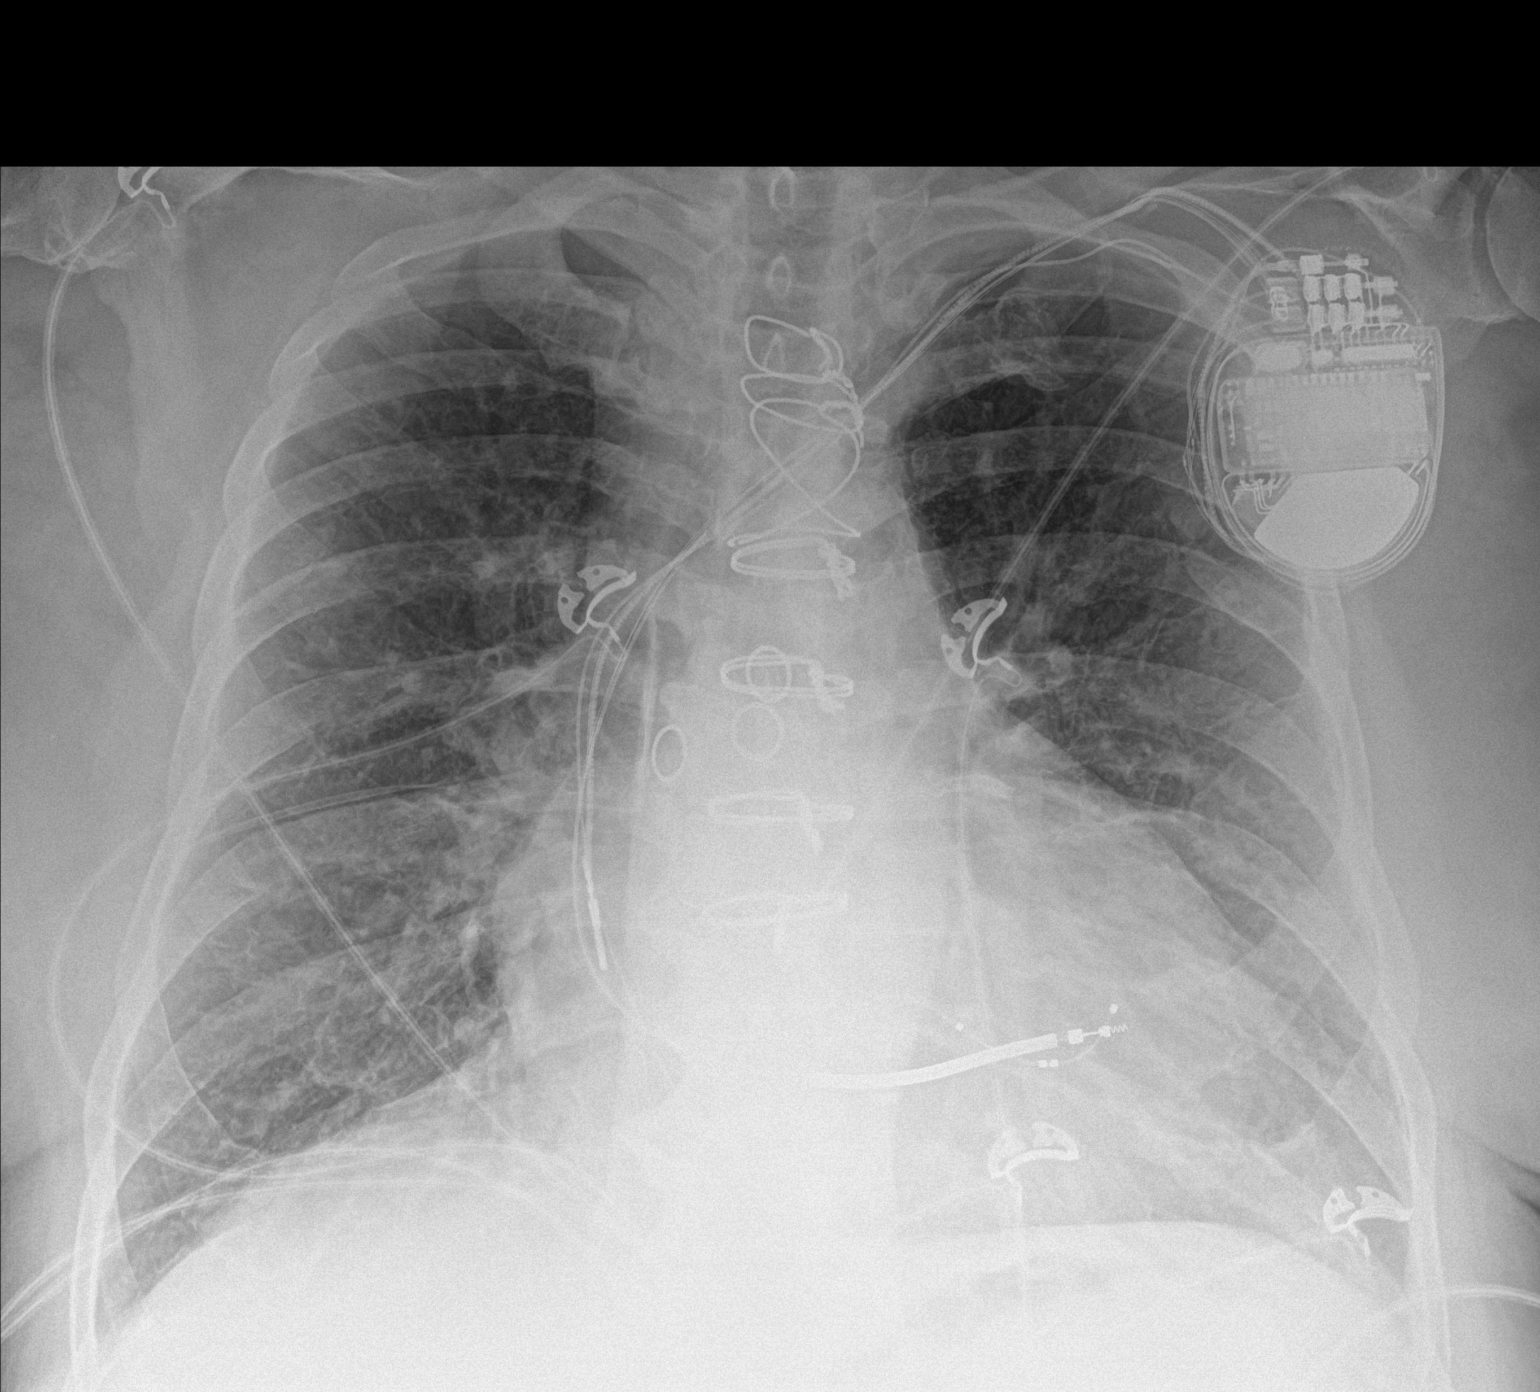

[1 of 1 positions shown; findings below may reference images not displayed]

FINDINGS: The lungs are well-aerated. Mild vascular congestion is noted.
Increased interstitial markings raise concern for mild interstitial
edema. There is no evidence of pleural effusion or pneumothorax.

The cardiomediastinal silhouette is borderline enlarged. The patient
is status post median sternotomy, with evidence of prior CABG. A
pacemaker/AICD is noted overlying the left chest wall, with leads
ending overlying the right atrium and right ventricle. No acute
osseous abnormalities are seen.
IMPRESSION: Mild vascular congestion and borderline cardiomegaly. Increased
interstitial markings raise concern for mild interstitial edema.

## 2018-05-23 DIAGNOSIS — I5022 Chronic systolic (congestive) heart failure: Secondary | ICD-10-CM | POA: Diagnosis not present

## 2018-05-23 DIAGNOSIS — I25810 Atherosclerosis of coronary artery bypass graft(s) without angina pectoris: Secondary | ICD-10-CM | POA: Diagnosis not present

## 2018-05-23 DIAGNOSIS — I952 Hypotension due to drugs: Secondary | ICD-10-CM | POA: Diagnosis not present

## 2018-05-30 ENCOUNTER — Encounter: Payer: Self-pay | Admitting: Family

## 2018-05-30 ENCOUNTER — Ambulatory Visit: Payer: PPO | Attending: Family | Admitting: Family

## 2018-05-30 VITALS — BP 112/59 | HR 78 | Resp 18 | Ht 66.0 in | Wt 245.5 lb

## 2018-05-30 DIAGNOSIS — E119 Type 2 diabetes mellitus without complications: Secondary | ICD-10-CM | POA: Diagnosis not present

## 2018-05-30 DIAGNOSIS — Z9889 Other specified postprocedural states: Secondary | ICD-10-CM | POA: Diagnosis not present

## 2018-05-30 DIAGNOSIS — Z951 Presence of aortocoronary bypass graft: Secondary | ICD-10-CM | POA: Diagnosis not present

## 2018-05-30 DIAGNOSIS — R42 Dizziness and giddiness: Secondary | ICD-10-CM | POA: Insufficient documentation

## 2018-05-30 DIAGNOSIS — I110 Hypertensive heart disease with heart failure: Secondary | ICD-10-CM | POA: Insufficient documentation

## 2018-05-30 DIAGNOSIS — Z7902 Long term (current) use of antithrombotics/antiplatelets: Secondary | ICD-10-CM | POA: Diagnosis not present

## 2018-05-30 DIAGNOSIS — Z809 Family history of malignant neoplasm, unspecified: Secondary | ICD-10-CM | POA: Insufficient documentation

## 2018-05-30 DIAGNOSIS — Z79899 Other long term (current) drug therapy: Secondary | ICD-10-CM | POA: Diagnosis not present

## 2018-05-30 DIAGNOSIS — Z888 Allergy status to other drugs, medicaments and biological substances status: Secondary | ICD-10-CM | POA: Insufficient documentation

## 2018-05-30 DIAGNOSIS — Z794 Long term (current) use of insulin: Secondary | ICD-10-CM | POA: Insufficient documentation

## 2018-05-30 DIAGNOSIS — I252 Old myocardial infarction: Secondary | ICD-10-CM | POA: Insufficient documentation

## 2018-05-30 DIAGNOSIS — I2510 Atherosclerotic heart disease of native coronary artery without angina pectoris: Secondary | ICD-10-CM | POA: Diagnosis not present

## 2018-05-30 DIAGNOSIS — Z8249 Family history of ischemic heart disease and other diseases of the circulatory system: Secondary | ICD-10-CM | POA: Diagnosis not present

## 2018-05-30 DIAGNOSIS — Z87891 Personal history of nicotine dependence: Secondary | ICD-10-CM | POA: Diagnosis not present

## 2018-05-30 DIAGNOSIS — I255 Ischemic cardiomyopathy: Secondary | ICD-10-CM | POA: Diagnosis not present

## 2018-05-30 DIAGNOSIS — I5022 Chronic systolic (congestive) heart failure: Secondary | ICD-10-CM | POA: Insufficient documentation

## 2018-05-30 DIAGNOSIS — Z8546 Personal history of malignant neoplasm of prostate: Secondary | ICD-10-CM | POA: Insufficient documentation

## 2018-05-30 DIAGNOSIS — J449 Chronic obstructive pulmonary disease, unspecified: Secondary | ICD-10-CM | POA: Diagnosis not present

## 2018-05-30 DIAGNOSIS — E1143 Type 2 diabetes mellitus with diabetic autonomic (poly)neuropathy: Secondary | ICD-10-CM

## 2018-05-30 DIAGNOSIS — Z881 Allergy status to other antibiotic agents status: Secondary | ICD-10-CM | POA: Diagnosis not present

## 2018-05-30 DIAGNOSIS — Z833 Family history of diabetes mellitus: Secondary | ICD-10-CM | POA: Diagnosis not present

## 2018-05-30 DIAGNOSIS — G4733 Obstructive sleep apnea (adult) (pediatric): Secondary | ICD-10-CM | POA: Diagnosis not present

## 2018-05-30 DIAGNOSIS — Z820 Family history of epilepsy and other diseases of the nervous system: Secondary | ICD-10-CM | POA: Insufficient documentation

## 2018-05-30 DIAGNOSIS — E7800 Pure hypercholesterolemia, unspecified: Secondary | ICD-10-CM | POA: Insufficient documentation

## 2018-05-30 DIAGNOSIS — K219 Gastro-esophageal reflux disease without esophagitis: Secondary | ICD-10-CM | POA: Diagnosis not present

## 2018-05-30 DIAGNOSIS — I10 Essential (primary) hypertension: Secondary | ICD-10-CM

## 2018-05-30 DIAGNOSIS — E785 Hyperlipidemia, unspecified: Secondary | ICD-10-CM | POA: Diagnosis not present

## 2018-05-30 MED ORDER — MAGNESIUM OXIDE -MG SUPPLEMENT 400 (240 MG) MG PO TABS: 1.0000 | ORAL_TABLET | Freq: Two times a day (BID) | ORAL | 3 refills | Status: DC

## 2018-05-30 MED ORDER — RANOLAZINE ER 500 MG PO TB12: 500.0000 mg | ORAL_TABLET | Freq: Two times a day (BID) | ORAL | 3 refills | Status: DC

## 2018-05-30 NOTE — Progress Notes (Signed)
Patient ID: ROLLAND STEINERT, male    DOB: Aug 12, 1946, 72 y.o.   MRN: [id number]  HPI  Mr Russomanno is a 72 y/o male with a history of prostate cancer, DM, hyperlipidemia, HTN, anemia, COPD, GERD, MI, previous tobacco use and chronic heart failure.   Echo report from 02/18/18 reviewed and showed an EF of 20-25% along with trivial AR and moderate MR. Echo report from 12/05/17 reviewed and showed an EF of 20-25% along with mild MR and moderate MR. Cardiac catheterization done 02/02/16 showed severed 3-vessel CAD with patent grafts. Continue medication management along with dual antiplatelet therapy.   Admitted 02/18/18 due to HF exacerbation. Cardiology consult obtained. Initially given IV lasix and then transitioned to oral diuretics. Needed oxygen and bipap initially as well. Supplemented with magnesium and discharged the following day per patient's request. Was in the ED 02/12/18 due to HF exacerbation. He had missed his most recent HF Clinic appointment. Given one dose of IV lasix and released. Admitted 12/30/17 due to HF exacerbation. Initially needed IV lasix and then transitioned to oral diuretics. Needed bipap initially as well. Discharged after 2 days. Admitted 12/05/17 due to HF exacerbation. Cardiology consult obtained. Elevated troponins thought to be due to demand ischemia. Discharged the following day. Was in the ED 12/04/17 with chest pain where he was evaluated and released. Was in the ED 11/22/17 due to gout where he was treated and released.   He presents today for a follow-up visit with a chief complaint of minimal shortness of breath upon moderate exertion. He has associated fatigue, light-headedness and easy bruising along with this. He denies any difficulty sleeping, abdominal distention, palpitations, pedal edema, chest pain or weight gain.    Past Medical History:  Diagnosis Date  . Anemia   . Cancer ([place]) 12/2013   prostate  . Cardiogenic pulmonary edema ([place]) 12/19/2014  .  Cardiomyopathy, ischemic   . CHF (congestive heart failure) ([place])   . COPD (chronic obstructive pulmonary disease) ([place])   . Diabetes mellitus without complication ([place])   . GERD (gastroesophageal reflux disease)   . Hypercholesteremia   . Hypertension   . Myocardial infarction ([place]) [id number]  . Shortness of breath dyspnea    Past Surgical History:  Procedure Laterality Date  . CARDIAC CATHETERIZATION N/A 02/02/2016   Procedure: Left Heart Cath and Coronary Angiography;  Surgeon: [name], MD;  Location: [facility];  Service: Cardiovascular;  Laterality: N/A;  . CORONARY ANGIOPLASTY WITH STENT PLACEMENT    . CORONARY ARTERY BYPASS GRAFT  11/24/2010  . IMPLANTABLE CARDIOVERTER DEFIBRILLATOR (ICD) GENERATOR CHANGE Left 12/12/2015   Procedure: DUAL LEAD PLACEMENT CARDIAC DIFIBRILLATOR;  Surgeon: [name], MD;  Location: [facility];  Service: Cardiovascular;  Laterality: Left;  . TONSILLECTOMY     Family History  Problem Relation Age of Onset  . CAD Mother   . Cancer Mother   . Diabetes Mother   . Alzheimer's disease Father   . Cancer Father   . Heart disease Father    Social History   Tobacco Use  . Smoking status: Former Smoker    Packs/day: 2.00    Years: 30.00    Pack years: 60.00    Last attempt to quit: 12/03/1996    Years since quitting: 21.5  . Smokeless tobacco: Never Used  Substance Use Topics  . Alcohol use: No    Alcohol/week: 0.0 oz   Allergies  Allergen Reactions  . Benadryl [Diphenhydramine] Other (See Comments)    "  Hyperactivity"  . Doxycycline Swelling    Pt went into pulmonary edema.  . Lopid [Gemfibrozil] Swelling    "I gain 1 pound a day for 30 days."   Prior to Admission medications   Medication Sig Start Date End Date Taking? Authorizing Provider  albuterol (PROVENTIL HFA;VENTOLIN HFA) 108 (90 Base) MCG/ACT inhaler Inhale 2 puffs into the lungs 4 (four) times daily as needed for wheezing or shortness of breath.    Yes  [provider]  albuterol-ipratropium (COMBIVENT) 18-103 MCG/ACT inhaler Inhale 1 puff into the lungs 2 times daily at 12 noon and 4 pm.   Yes [provider]  allopurinol (ZYLOPRIM) 100 MG tablet Take 100 mg by mouth daily.   Yes [provider]  atorvastatin (LIPITOR) 80 MG tablet Take 1 tablet (80 mg total) by mouth every evening. 04/21/17  Yes Theodoro [name], MD  budesonide-formoterol (SYMBICORT) 80-4.5 MCG/ACT inhaler Inhale 2 puffs into the lungs 2 (two) times daily.   Yes [provider]  calcium carbonate (TUMS - DOSED IN MG ELEMENTAL CALCIUM) 500 MG chewable tablet Chew 1 tablet by mouth as needed for indigestion or heartburn.   Yes [provider]  carvedilol (COREG) 3.125 MG tablet Take 1 tablet (3.125 mg total) by mouth 2 (two) times daily with a meal. 01/01/18  Yes Gladstone [name], MD  citalopram (CELEXA) 20 MG tablet Take 1 tablet by mouth 2 (two) times daily.   Yes [provider]  clopidogrel (PLAVIX) 75 MG tablet Take 75 mg by mouth every morning.   Yes [provider]  co-enzyme Q-10 30 MG capsule Take 30 mg by mouth daily.   Yes [provider]  ferrous sulfate 325 (65 FE) MG tablet Take 325 mg by mouth 2 (two) times daily with a meal.   Yes [provider]  finasteride (PROSCAR) 5 MG tablet Take 5 mg by mouth at bedtime.   Yes [provider]  FLUoxetine (PROZAC) 20 MG capsule Take 20 mg by mouth daily.   Yes [provider]  gabapentin (NEURONTIN) 300 MG capsule Take 300 mg by mouth 2 (two) times daily.    Yes [provider]  insulin glargine (LANTUS) 100 UNIT/ML injection Inject 0.6 mLs (60 Units total) into the skin at bedtime. Patient taking differently: Inject 100 Units into the skin at bedtime.  01/01/18  Yes Gladstone [name], MD  insulin lispro (HUMALOG) 100 UNIT/ML KiwkPen Inject 0-18 Units into the skin 3 (three) times daily with meals. Per sliding scale   Yes  [provider]  loratadine (CLARITIN) 10 MG tablet Take 10 mg by mouth daily.   Yes [provider]  Magnesium Oxide 400 (240 Mg) MG TABS Take 1 tablet (400 mg total) by mouth 2 (two) times daily. 05/30/18  Yes Hackney, Otila Kluver A, FNP  Multiple Vitamins-Minerals (CENTRUM SILVER PO) Take 1 tablet by mouth every morning.   Yes [provider]  nitroGLYCERIN (NITROSTAT) 0.4 MG SL tablet Place 0.4 mg under the tongue every 5 (five) minutes as needed for chest pain.   Yes [provider]  pantoprazole (PROTONIX) 40 MG tablet Take 80 mg by mouth 2 (two) times daily.    Yes [provider]  ramipril (ALTACE) 2.5 MG capsule Take 1 capsule (2.5 mg total) by mouth daily. 02/19/18 02/19/19 Yes Wieting, Richard, MD  ranolazine (RANEXA) 500 MG 12 hr tablet Take 1 tablet (500 mg total) by mouth 2 (two) times daily. 05/30/18  Yes Alisa Graff, FNP  spironolactone (ALDACTONE) 25 MG tablet Take 25 mg by mouth daily.    Yes [provider]  tamsulosin (FLOMAX) 0.4 MG CAPS capsule Take 0.4 mg by mouth daily.   Yes [provider]  torsemide (DEMADEX) 20 MG tablet 1 tab po twice a day.  For weight gain of 3 lbs in one day or five lbs in a week then increase to 2 tabs in am and one in afternoon 02/19/18  Yes Loletha Grayer, MD   Review of Systems  Constitutional: Positive for fatigue. Negative for appetite change.  HENT: Negative for congestion, postnasal drip and sore throat.   Eyes: Negative.   Respiratory: Positive for shortness of breath. Negative for cough and chest tightness.   Cardiovascular: Negative for chest pain, palpitations and leg swelling.  Gastrointestinal: Negative for abdominal distention and abdominal pain.  Endocrine: Negative.   Genitourinary: Negative.   Musculoskeletal: Negative.   Skin: Negative.   Allergic/Immunologic: Negative.   Neurological: Positive for light-headedness (when changing positions too quickly). Negative for  dizziness.  Hematological: Negative for adenopathy. Bruises/bleeds easily.  Psychiatric/Behavioral: Negative for dysphoric mood and sleep disturbance (sleeping well with CPAP and oxygen). The patient is not nervous/anxious.    Vitals:   05/30/18 1344  BP: (!) 112/59  Pulse: 78  Resp: 18  SpO2: 99%  Weight: 245 lb 8 oz (111.4 kg)  Height: 5\' 6"  (1.676 m)   Wt Readings from Last 3 Encounters:  05/30/18 245 lb 8 oz (111.4 kg)  03/21/18 245 lb 4 oz (111.2 kg)  03/01/18 247 lb (112 kg)   Lab Results  Component Value Date   CREATININE 1.35 (H) 02/19/2018   CREATININE 1.24 02/18/2018   CREATININE 1.07 02/12/2018    Physical Exam  Constitutional: He is oriented to person, place, and time. He appears well-developed and well-nourished.  HENT:  Head: Normocephalic and atraumatic.  Neck: Normal range of motion. Neck supple. No JVD present.  Cardiovascular: Normal rate and regular rhythm.  Pulmonary/Chest: Effort normal. He has no wheezes. He has no rales.  Abdominal: He exhibits no distension. There is no tenderness.  Musculoskeletal: He exhibits no edema or tenderness.  Neurological: He is alert and oriented to person, place, and time.  Skin: Skin is warm and dry.  Psychiatric: He has a normal mood and affect. His behavior is normal. Thought content normal.  Nursing note and vitals reviewed.  Assessment & Plan:  1: Chronic heart failure with reduced ejection fraction-  - NYHA class II - euvolemic today - has been weighing daily and home weight chart was reviewed. Reminded to call for an overnight weight gain of 2 pounds or > or a weekly weight gain of >5 pounds - weight stable from last time he was here - not adding salt but does eat fast food often. Eats at [employer] 7 days a week. Discussed the importance of closely following a 2000mg  sodium diet and to limit fast food - at at [employer] and noticed it was very salty so took an extra torsemide that night - says that he  took extra torsemide ~ 3 times this past month - saw cardiology Nehemiah Massed) 05/23/18 - BNP from 02/18/18 was 805.0  2: HTN- - BP looks good today - saw PCP ([place]) 03/07/18 - BMP from 05/23/18 reviewed and showed sodium 141, potassium 3.6 and GFR 54   3: Obstructive sleep apnea-   - wearing CPAP nightly along with oxygen at 5L - reports sleeping well  4: Diabetes-  -  fasting glucose at home this morning was 101 - A1c on 03/21/18 was 6.7% - saw endocrinologist ([place]) 03/28/18  Patient did not bring his medications nor a list. Each medication was verbally reviewed with the patient and he was encouraged to bring the bottles to every visit to confirm accuracy of list.   Return in 3 months or sooner for any questions/problems before then.

## 2018-05-30 NOTE — Patient Instructions (Signed)
Continue weighing daily and call for an overnight weight gain of > 2 pounds or a weekly weight gain of >5 pounds. 

## 2018-06-04 ENCOUNTER — Other Ambulatory Visit: Payer: Self-pay

## 2018-06-04 ENCOUNTER — Emergency Department
Admission: EM | Admit: 2018-06-04 | Discharge: 2018-06-04 | Disposition: A | Discharge status: Home / Self Care | Payer: PPO | Source: Home / Self Care | Attending: Emergency Medicine | Admitting: Emergency Medicine

## 2018-06-04 ENCOUNTER — Emergency Department: Payer: PPO

## 2018-06-04 DIAGNOSIS — Z87891 Personal history of nicotine dependence: Secondary | ICD-10-CM

## 2018-06-04 DIAGNOSIS — R0602 Shortness of breath: Secondary | ICD-10-CM | POA: Diagnosis not present

## 2018-06-04 DIAGNOSIS — Z794 Long term (current) use of insulin: Secondary | ICD-10-CM | POA: Insufficient documentation

## 2018-06-04 DIAGNOSIS — E119 Type 2 diabetes mellitus without complications: Secondary | ICD-10-CM | POA: Insufficient documentation

## 2018-06-04 DIAGNOSIS — N179 Acute kidney failure, unspecified: Secondary | ICD-10-CM | POA: Diagnosis not present

## 2018-06-04 DIAGNOSIS — G4733 Obstructive sleep apnea (adult) (pediatric): Secondary | ICD-10-CM | POA: Diagnosis not present

## 2018-06-04 DIAGNOSIS — J441 Chronic obstructive pulmonary disease with (acute) exacerbation: Secondary | ICD-10-CM

## 2018-06-04 DIAGNOSIS — I110 Hypertensive heart disease with heart failure: Secondary | ICD-10-CM | POA: Insufficient documentation

## 2018-06-04 DIAGNOSIS — I255 Ischemic cardiomyopathy: Secondary | ICD-10-CM | POA: Diagnosis not present

## 2018-06-04 DIAGNOSIS — E785 Hyperlipidemia, unspecified: Secondary | ICD-10-CM | POA: Diagnosis not present

## 2018-06-04 DIAGNOSIS — Z820 Family history of epilepsy and other diseases of the nervous system: Secondary | ICD-10-CM | POA: Diagnosis not present

## 2018-06-04 DIAGNOSIS — Z7902 Long term (current) use of antithrombotics/antiplatelets: Secondary | ICD-10-CM | POA: Insufficient documentation

## 2018-06-04 DIAGNOSIS — R079 Chest pain, unspecified: Secondary | ICD-10-CM | POA: Diagnosis not present

## 2018-06-04 DIAGNOSIS — I130 Hypertensive heart and chronic kidney disease with heart failure and stage 1 through stage 4 chronic kidney disease, or unspecified chronic kidney disease: Secondary | ICD-10-CM | POA: Diagnosis not present

## 2018-06-04 DIAGNOSIS — I252 Old myocardial infarction: Secondary | ICD-10-CM | POA: Diagnosis not present

## 2018-06-04 DIAGNOSIS — I509 Heart failure, unspecified: Secondary | ICD-10-CM | POA: Diagnosis not present

## 2018-06-04 DIAGNOSIS — I5023 Acute on chronic systolic (congestive) heart failure: Secondary | ICD-10-CM

## 2018-06-04 DIAGNOSIS — Z955 Presence of coronary angioplasty implant and graft: Secondary | ICD-10-CM | POA: Diagnosis not present

## 2018-06-04 DIAGNOSIS — Z8546 Personal history of malignant neoplasm of prostate: Secondary | ICD-10-CM | POA: Diagnosis not present

## 2018-06-04 DIAGNOSIS — K219 Gastro-esophageal reflux disease without esophagitis: Secondary | ICD-10-CM | POA: Diagnosis not present

## 2018-06-04 DIAGNOSIS — Z809 Family history of malignant neoplasm, unspecified: Secondary | ICD-10-CM | POA: Diagnosis not present

## 2018-06-04 DIAGNOSIS — N183 Chronic kidney disease, stage 3 (moderate): Secondary | ICD-10-CM | POA: Diagnosis not present

## 2018-06-04 DIAGNOSIS — Z951 Presence of aortocoronary bypass graft: Secondary | ICD-10-CM | POA: Diagnosis not present

## 2018-06-04 DIAGNOSIS — I25119 Atherosclerotic heart disease of native coronary artery with unspecified angina pectoris: Secondary | ICD-10-CM | POA: Diagnosis not present

## 2018-06-04 DIAGNOSIS — Z833 Family history of diabetes mellitus: Secondary | ICD-10-CM | POA: Diagnosis not present

## 2018-06-04 DIAGNOSIS — Z7951 Long term (current) use of inhaled steroids: Secondary | ICD-10-CM | POA: Diagnosis not present

## 2018-06-04 DIAGNOSIS — I208 Other forms of angina pectoris: Secondary | ICD-10-CM | POA: Diagnosis not present

## 2018-06-04 DIAGNOSIS — E7800 Pure hypercholesterolemia, unspecified: Secondary | ICD-10-CM | POA: Diagnosis not present

## 2018-06-04 DIAGNOSIS — Z8249 Family history of ischemic heart disease and other diseases of the circulatory system: Secondary | ICD-10-CM | POA: Diagnosis not present

## 2018-06-04 DIAGNOSIS — I5022 Chronic systolic (congestive) heart failure: Secondary | ICD-10-CM | POA: Diagnosis not present

## 2018-06-04 DIAGNOSIS — R0600 Dyspnea, unspecified: Secondary | ICD-10-CM | POA: Diagnosis not present

## 2018-06-04 DIAGNOSIS — E1122 Type 2 diabetes mellitus with diabetic chronic kidney disease: Secondary | ICD-10-CM | POA: Diagnosis not present

## 2018-06-04 LAB — CBC
HEMATOCRIT: 31.6 % — AB (ref 40.0–52.0)
Hemoglobin: 11 g/dL — ABNORMAL LOW (ref 13.0–18.0)
MCH: 32.4 pg (ref 26.0–34.0)
MCHC: 34.9 g/dL (ref 32.0–36.0)
MCV: 92.9 fL (ref 80.0–100.0)
Platelets: 194 10*3/uL (ref 150–440)
RBC: 3.4 MIL/uL — ABNORMAL LOW (ref 4.40–5.90)
RDW: 14 % (ref 11.5–14.5)
WBC: 8.7 10*3/uL (ref 3.8–10.6)

## 2018-06-04 LAB — BASIC METABOLIC PANEL
ANION GAP: 11 (ref 5–15)
BUN: 14 mg/dL (ref 8–23)
CHLORIDE: 102 mmol/L (ref 98–111)
CO2: 27 mmol/L (ref 22–32)
Calcium: 8 mg/dL — ABNORMAL LOW (ref 8.9–10.3)
Creatinine, Ser: 1.31 mg/dL — ABNORMAL HIGH (ref 0.61–1.24)
GFR, EST NON AFRICAN AMERICAN: 52 mL/min — AB (ref >60)
Glucose, Bld: 220 mg/dL — ABNORMAL HIGH (ref 70–99)
POTASSIUM: 3.5 mmol/L (ref 3.5–5.1)
SODIUM: 140 mmol/L (ref 135–145)

## 2018-06-04 LAB — TROPONIN I: Troponin I: <0.03 ng/mL (ref <0.03)

## 2018-06-04 LAB — BRAIN NATRIURETIC PEPTIDE: B Natriuretic Peptide: 271 pg/mL — ABNORMAL HIGH (ref 0.0–100.0)

## 2018-06-04 MED ORDER — FUROSEMIDE 10 MG/ML IJ SOLN
40.0000 mg | Freq: Once | INTRAMUSCULAR | Status: AC
  Administered 2018-06-04: 40 mg via INTRAVENOUS
  Filled 2018-06-04: qty 4

## 2018-06-04 MED ORDER — IPRATROPIUM-ALBUTEROL 0.5-2.5 (3) MG/3ML IN SOLN
3.0000 mL | Freq: Once | RESPIRATORY_TRACT | Status: AC
  Administered 2018-06-04: 3 mL via RESPIRATORY_TRACT
  Filled 2018-06-04: qty 3

## 2018-06-04 MED ORDER — METHYLPREDNISOLONE SODIUM SUCC 125 MG IJ SOLR
125.0000 mg | Freq: Once | INTRAMUSCULAR | Status: AC
  Administered 2018-06-04: 125 mg via INTRAVENOUS
  Filled 2018-06-04: qty 2

## 2018-06-04 MED ORDER — PREDNISONE 20 MG PO TABS: 60.0000 mg | ORAL_TABLET | Freq: Every day | ORAL | 0 refills | Status: DC

## 2018-06-04 MED ORDER — ALBUTEROL SULFATE HFA 108 (90 BASE) MCG/ACT IN AERS: 2.0000 | INHALATION_SPRAY | Freq: Four times a day (QID) | RESPIRATORY_TRACT | 2 refills | Status: AC | PRN

## 2018-06-04 NOTE — ED Triage Notes (Signed)
Pt arrives to ED with c/o of SOB and CP. States CP all across. States hx of flash pulmonary edema. Pt requesting bipap. Pt talking in complete sentences. No extra work of breathing. No resp distress noted at this time. Symptoms began in the last few hours. Took 2 nitro SL. Pt took torsemide this morning, didn't take lasix today.

## 2018-06-04 NOTE — Discharge Instructions (Addendum)
Take prednisone once a day as prescribed.  Use your albuterol inhaler 2 puffs every 4 hours as needed for shortness of breath.  Increase her torsemide to 40 mg in the morning and keep it at 20 mg in the evening for the next 3 days and then go back to your dose of 20 mg twice a day.  Follow-up with Dr. Nehemiah Massed in 2 days.  Return to the emergency room for new or worsening chest pain, shortness of breath, or any other symptoms concerning to you.

## 2018-06-04 NOTE — ED Provider Notes (Signed)
[facility] Provider Note  ____________________________________________  Time seen: Approximately 7:09 PM  I have reviewed the triage vital signs and the nursing notes.   HISTORY  Chief Complaint Shortness of Breath   HPI [name] is a 72 y.o. male with a history of CHF with EF of 20 to 25%, pulmonary edema, COPD, diabetes, hypertension, hyperlipidemia, CAD status post CABG and stents who presents for evaluation of shortness of breath.  Patient reports that his symptoms started 3 hours ago.  Is complaining of tightness diffusely across his chest associated with shortness of breath.  No changes in his chronic cough.  No weight gain.  He has been compliant with his torsemide.  No fever or chills.  No personal or family history of blood clots, no recent travel immobilization, no leg pain or swelling, no hemoptysis, no exogenous hormones.  Patient reports that he feels like his prior CHF exacerbations.  Past Medical History:  Diagnosis Date  . Anemia   . Cancer ([place]) 12/2013   prostate  . Cardiogenic pulmonary edema ([place]) 12/19/2014  . Cardiomyopathy, ischemic   . CHF (congestive heart failure) ([place])   . COPD (chronic obstructive pulmonary disease) ([place])   . Diabetes mellitus without complication ([place])   . GERD (gastroesophageal reflux disease)   . Hypercholesteremia   . Hypertension   . Myocardial infarction ([place]) [id number]  . Shortness of breath dyspnea     Patient Active Problem List   Diagnosis Date Noted  . Acute on chronic systolic CHF (congestive heart failure) ([place]) 04/21/2017  . Hypokalemia 04/21/2017  . Hypomagnesemia 04/21/2017  . Essential hypertension 04/21/2017  . Hyperlipidemia 04/21/2017  . Coronary artery disease 04/21/2017  . COPD with chronic bronchitis ([place]) 02/13/2016  . Obstructive sleep apnea 02/13/2016  . Diabetes ([place]) 02/13/2016  . Chronic systolic HF (heart failure) ([place]) 12/12/2015     Past Surgical History:  Procedure Laterality Date  . CARDIAC CATHETERIZATION N/A 02/02/2016   Procedure: Left Heart Cath and Coronary Angiography;  Surgeon: [name], MD;  Location: [facility];  Service: Cardiovascular;  Laterality: N/A;  . CORONARY ANGIOPLASTY WITH STENT PLACEMENT    . CORONARY ARTERY BYPASS GRAFT  11/24/2010  . IMPLANTABLE CARDIOVERTER DEFIBRILLATOR (ICD) GENERATOR CHANGE Left 12/12/2015   Procedure: DUAL LEAD PLACEMENT CARDIAC DIFIBRILLATOR;  Surgeon: [name], MD;  Location: [facility];  Service: Cardiovascular;  Laterality: Left;  . TONSILLECTOMY      Prior to Admission medications   Medication Sig Start Date End Date Taking? Authorizing Provider  albuterol (PROVENTIL HFA;VENTOLIN HFA) 108 (90 Base) MCG/ACT inhaler Inhale 2 puffs into the lungs 4 (four) times daily as needed for wheezing or shortness of breath.     [provider]  albuterol (PROVENTIL HFA;VENTOLIN HFA) 108 (90 Base) MCG/ACT inhaler Inhale 2 puffs into the lungs every 6 (six) hours as needed for wheezing or shortness of breath. 06/04/18   Rudene Re, MD  albuterol-ipratropium (COMBIVENT) 18-103 MCG/ACT inhaler Inhale 1 puff into the lungs 2 times daily at 12 noon and 4 pm.    [provider]  allopurinol (ZYLOPRIM) 100 MG tablet Take 100 mg by mouth daily.    [provider]  atorvastatin (LIPITOR) 80 MG tablet Take 1 tablet (80 mg total) by mouth every evening. 04/21/17   Theodoro [name], MD  budesonide-formoterol (SYMBICORT) 80-4.5 MCG/ACT inhaler Inhale 2 puffs into the lungs 2 (two) times daily.    [provider]  calcium carbonate (  TUMS - DOSED IN MG ELEMENTAL CALCIUM) 500 MG chewable tablet Chew 1 tablet by mouth as needed for indigestion or heartburn.    [provider]  carvedilol (COREG) 3.125 MG tablet Take 1 tablet (3.125 mg total) by mouth 2 (two) times daily with a meal. 01/01/18   Gladstone [name], MD  citalopram  (CELEXA) 20 MG tablet Take 1 tablet by mouth 2 (two) times daily.    [provider]  clopidogrel (PLAVIX) 75 MG tablet Take 75 mg by mouth every morning.    [provider]  co-enzyme Q-10 30 MG capsule Take 30 mg by mouth daily.    [provider]  ferrous sulfate 325 (65 FE) MG tablet Take 325 mg by mouth 2 (two) times daily with a meal.    [provider]  finasteride (PROSCAR) 5 MG tablet Take 5 mg by mouth at bedtime.    [provider]  FLUoxetine (PROZAC) 20 MG capsule Take 20 mg by mouth daily.    [provider]  gabapentin (NEURONTIN) 300 MG capsule Take 300 mg by mouth 2 (two) times daily.     [provider]  insulin glargine (LANTUS) 100 UNIT/ML injection Inject 0.6 mLs (60 Units total) into the skin at bedtime. Patient taking differently: Inject 100 Units into the skin at bedtime.  01/01/18   Gladstone [name], MD  insulin lispro (HUMALOG) 100 UNIT/ML KiwkPen Inject 0-18 Units into the skin 3 (three) times daily with meals. Per sliding scale    [provider]  loratadine (CLARITIN) 10 MG tablet Take 10 mg by mouth daily.    [provider]  Magnesium Oxide 400 (240 Mg) MG TABS Take 1 tablet (400 mg total) by mouth 2 (two) times daily. 05/30/18   Alisa Graff, FNP  Multiple Vitamins-Minerals (CENTRUM SILVER PO) Take 1 tablet by mouth every morning.    [provider]  nitroGLYCERIN (NITROSTAT) 0.4 MG SL tablet Place 0.4 mg under the tongue every 5 (five) minutes as needed for chest pain.    [provider]  pantoprazole (PROTONIX) 40 MG tablet Take 80 mg by mouth 2 (two) times daily.     [provider]  predniSONE (DELTASONE) 20 MG tablet Take 3 tablets (60 mg total) by mouth daily for 4 days. 06/04/18 06/08/18  Rudene Re, MD  ramipril (ALTACE) 2.5 MG capsule Take 1 capsule (2.5 mg total) by mouth daily. 02/19/18 02/19/19  Loletha Grayer, MD  ranolazine (RANEXA) 500 MG  12 hr tablet Take 1 tablet (500 mg total) by mouth 2 (two) times daily. 05/30/18   Alisa Graff, FNP  spironolactone (ALDACTONE) 25 MG tablet Take 25 mg by mouth daily.     [provider]  tamsulosin (FLOMAX) 0.4 MG CAPS capsule Take 0.4 mg by mouth daily.    [provider]  torsemide (DEMADEX) 20 MG tablet 1 tab po twice a day.  For weight gain of 3 lbs in one day or five lbs in a week then increase to 2 tabs in am and one in afternoon 02/19/18   Loletha Grayer, MD    Allergies Benadryl [diphenhydramine]; Doxycycline; and Lopid [gemfibrozil]  Family History  Problem Relation Age of Onset  . CAD Mother   . Cancer Mother   . Diabetes Mother   . Alzheimer's disease Father   . Cancer Father   . Heart disease Father     Social History Social History   Tobacco Use  . Smoking status: Former  Smoker    Packs/day: 2.00    Years: 30.00    Pack years: 60.00    Last attempt to quit: 12/03/1996    Years since quitting: 21.5  . Smokeless tobacco: Never Used  Substance Use Topics  . Alcohol use: No    Alcohol/week: 0.0 oz  . Drug use: No    Review of Systems  Constitutional: Negative for fever. Eyes: Negative for visual changes. ENT: Negative for sore throat. Neck: No neck pain  Cardiovascular: + chest tightness Respiratory: + shortness of breath. Gastrointestinal: Negative for abdominal pain, vomiting or diarrhea. Genitourinary: Negative for dysuria. Musculoskeletal: Negative for back pain. Skin: Negative for rash. Neurological: Negative for headaches, weakness or numbness. Psych: No SI or HI  ____________________________________________   PHYSICAL EXAM:  VITAL SIGNS: ED Triage Vitals  Enc Vitals Group     BP 06/04/18 1837 (!) 134/103     Pulse Rate 06/04/18 1837 84     Resp 06/04/18 1837 20     Temp 06/04/18 1837 98.3 F (36.8 C)     Temp Source 06/04/18 1837 Oral     SpO2 06/04/18 1837 97 %     Weight 06/04/18 1835 245 lb (111.1 kg)      Height 06/04/18 1835 5\' 7"  (1.702 m)     Head Circumference --      Peak Flow --      Pain Score 06/04/18 1834 4     Pain Loc --      Pain Edu? --      Excl. in [place]? --     Constitutional: Alert and oriented. Well appearing and in no apparent distress. HEENT:      Head: Normocephalic and atraumatic.         Eyes: Conjunctivae are normal. Sclera is non-icteric.       Mouth/Throat: Mucous membranes are moist.       Neck: Supple with no signs of meningismus. Cardiovascular: Regular rate and rhythm. No murmurs, gallops, or rubs. 2+ symmetrical distal pulses are present in all extremities. No JVD. Respiratory: Normal respiratory effort.  Severely decreased air movement with expiratory wheezes bilaterally Gastrointestinal: Soft, non tender, and non distended with positive bowel sounds. No rebound or guarding. Musculoskeletal: Trace pitting edema bilaterally Neurologic: Normal speech and language. Face is symmetric. Moving all extremities. No gross focal neurologic deficits are appreciated. Skin: Skin is warm, dry and intact. No rash noted. Psychiatric: Mood and affect are normal. Speech and behavior are normal.  ____________________________________________   LABS (all labs ordered are listed, but only abnormal results are displayed)  Labs Reviewed  BASIC METABOLIC PANEL - Abnormal; Notable for the following components:      Result Value   Glucose, Bld 220 (*)    Creatinine, Ser 1.31 (*)    Calcium 8.0 (*)    GFR calc non Af Amer 52 (*)    All other components within normal limits  CBC - Abnormal; Notable for the following components:   RBC 3.40 (*)    Hemoglobin 11.0 (*)    HCT 31.6 (*)    All other components within normal limits  BRAIN NATRIURETIC PEPTIDE - Abnormal; Notable for the following components:   B Natriuretic Peptide 271.0 (*)    All other components within normal limits  TROPONIN I   ____________________________________________  EKG  ED ECG REPORT I, Rudene Re, the attending physician, personally viewed and interpreted this ECG.  Ventricular paced rhythm, rate of 86, prolonged QTC, normal axis, no  ST elevations or depressions.  Unchanged from prior. ____________________________________________  RADIOLOGY  I have personally reviewed the images performed during this visit and I agree with the Radiologist's read.   Interpretation by Radiologist:  Dg Chest 2 View  Result Date: 06/04/2018 CLINICAL DATA:  Shortness of breath and chest pain EXAM: CHEST - 2 VIEW COMPARISON:  02/19/2018, 02/18/2018 FINDINGS: Sternotomy changes. Similar appearance of left-sided pacing device. Mild cardiomegaly with central vascular congestion. No pleural effusion or focal consolidation. No pneumothorax. Degenerative changes of the spine. IMPRESSION: Mild cardiomegaly with vascular congestion. Electronically Signed   By: Donavan Foil M.D.   On: 06/04/2018 19:28      ____________________________________________   PROCEDURES  Procedure(s) performed: None Procedures Critical Care performed:  None ____________________________________________   INITIAL IMPRESSION / ASSESSMENT AND PLAN / ED COURSE   73 y.o. male with a history of CHF with EF of 20 to 25%, pulmonary edema, COPD, diabetes, hypertension, hyperlipidemia, CAD status post CABG and stents who presents for evaluation of chest tightness and shortness of breath.  Patient has normal work of breathing, satting 100% on room air, he does have severely decreased air movement bilaterally with expiratory wheezes.  He has trace pitting edema bilaterally.  Differential diagnoses including COPD exacerbation versus CHF versus ACS versus PE versus PNA.  EKG showing ventricular paced rhythm with no ischemic changes. Will give duoneb x 3 and solumedrol, will give 40mg  of IV lasix. Labs including CBC, BMP, BNP, troponin pending. CXR showing mild pulmonary edema and cardiomegaly  Clinical Course as of Jun 04 2110  Nancy Fetter Jun 04, 2018  2103 Patient reports resolution of chest tightness and shortness of breath after 3 DuoNeb's.  He received a 40 mg of IV Lasix.  BNP slightly elevated but the lowest is ever been for the patient since 06/2017. CXR with no PNA or PTX. Low suspicion for PE with resolution of symptoms.Will dc on prednisone for 4 days, albuterol, increase torsemide to 40mg  qam for 3 days and close f/u with cardiologist.  Discussed strict return precautions for recurrence of chest pain or shortness of breath and recommended return to the emergency room if these develop.  Patient remains well-appearing with normal work of breathing and normal sats.  He has improved air movement and no longer wheezing after DuoNebs   [CV]    Clinical Course User Index [CV] Alfred Levins, [name], MD     As part of my medical decision making, I reviewed the following data within the [facility] notes reviewed and incorporated, Labs reviewed , EKG interpreted , Old EKG reviewed, Old chart reviewed, Radiograph reviewed  Notes from prior ED visits and [place] Controlled Substance Database    Pertinent labs & imaging results that were available during my care of the patient were reviewed by me and considered in my medical decision making (see chart for details).    ____________________________________________   FINAL CLINICAL IMPRESSION(S) / ED DIAGNOSES  Final diagnoses:  SOB (shortness of breath)  COPD exacerbation (HCC)  Acute on chronic congestive heart failure, unspecified heart failure type ([place])      NEW MEDICATIONS STARTED DURING THIS VISIT:  ED Discharge Orders        Ordered    predniSONE (DELTASONE) 20 MG tablet  Daily     06/04/18 2105    albuterol (PROVENTIL HFA;VENTOLIN HFA) 108 (90 Base) MCG/ACT inhaler  Every 6 hours PRN     06/04/18 2107       Note:  This  document was prepared using Systems analyst and may include unintentional dictation errors.    Rudene Re, MD 06/04/18 2112

## 2018-06-06 ENCOUNTER — Emergency Department: Payer: PPO

## 2018-06-06 ENCOUNTER — Other Ambulatory Visit: Payer: Self-pay

## 2018-06-06 ENCOUNTER — Encounter: Payer: Self-pay | Admitting: Emergency Medicine

## 2018-06-06 ENCOUNTER — Inpatient Hospital Stay
Admission: EM | Admit: 2018-06-06 | Discharge: 2018-06-08 | DRG: 191 | Disposition: A | Discharge status: Home / Self Care | Payer: PPO | Attending: Internal Medicine | Admitting: Internal Medicine

## 2018-06-06 DIAGNOSIS — E7800 Pure hypercholesterolemia, unspecified: Secondary | ICD-10-CM | POA: Diagnosis present

## 2018-06-06 DIAGNOSIS — Z79899 Other long term (current) drug therapy: Secondary | ICD-10-CM

## 2018-06-06 DIAGNOSIS — Z809 Family history of malignant neoplasm, unspecified: Secondary | ICD-10-CM

## 2018-06-06 DIAGNOSIS — K219 Gastro-esophageal reflux disease without esophagitis: Secondary | ICD-10-CM | POA: Diagnosis present

## 2018-06-06 DIAGNOSIS — N183 Chronic kidney disease, stage 3 (moderate): Secondary | ICD-10-CM | POA: Diagnosis present

## 2018-06-06 DIAGNOSIS — I130 Hypertensive heart and chronic kidney disease with heart failure and stage 1 through stage 4 chronic kidney disease, or unspecified chronic kidney disease: Secondary | ICD-10-CM | POA: Diagnosis present

## 2018-06-06 DIAGNOSIS — J441 Chronic obstructive pulmonary disease with (acute) exacerbation: Secondary | ICD-10-CM | POA: Diagnosis present

## 2018-06-06 DIAGNOSIS — Z87891 Personal history of nicotine dependence: Secondary | ICD-10-CM | POA: Diagnosis not present

## 2018-06-06 DIAGNOSIS — Z7902 Long term (current) use of antithrombotics/antiplatelets: Secondary | ICD-10-CM | POA: Diagnosis not present

## 2018-06-06 DIAGNOSIS — I255 Ischemic cardiomyopathy: Secondary | ICD-10-CM | POA: Diagnosis present

## 2018-06-06 DIAGNOSIS — I25119 Atherosclerotic heart disease of native coronary artery with unspecified angina pectoris: Secondary | ICD-10-CM | POA: Diagnosis present

## 2018-06-06 DIAGNOSIS — R0602 Shortness of breath: Secondary | ICD-10-CM

## 2018-06-06 DIAGNOSIS — E1122 Type 2 diabetes mellitus with diabetic chronic kidney disease: Secondary | ICD-10-CM | POA: Diagnosis present

## 2018-06-06 DIAGNOSIS — G4733 Obstructive sleep apnea (adult) (pediatric): Secondary | ICD-10-CM | POA: Diagnosis present

## 2018-06-06 DIAGNOSIS — I5022 Chronic systolic (congestive) heart failure: Secondary | ICD-10-CM | POA: Diagnosis present

## 2018-06-06 DIAGNOSIS — Z8249 Family history of ischemic heart disease and other diseases of the circulatory system: Secondary | ICD-10-CM

## 2018-06-06 DIAGNOSIS — I252 Old myocardial infarction: Secondary | ICD-10-CM

## 2018-06-06 DIAGNOSIS — N400 Enlarged prostate without lower urinary tract symptoms: Secondary | ICD-10-CM | POA: Diagnosis present

## 2018-06-06 DIAGNOSIS — Z888 Allergy status to other drugs, medicaments and biological substances status: Secondary | ICD-10-CM

## 2018-06-06 DIAGNOSIS — F329 Major depressive disorder, single episode, unspecified: Secondary | ICD-10-CM | POA: Diagnosis present

## 2018-06-06 DIAGNOSIS — Z794 Long term (current) use of insulin: Secondary | ICD-10-CM | POA: Diagnosis not present

## 2018-06-06 DIAGNOSIS — Z955 Presence of coronary angioplasty implant and graft: Secondary | ICD-10-CM | POA: Diagnosis not present

## 2018-06-06 DIAGNOSIS — N179 Acute kidney failure, unspecified: Secondary | ICD-10-CM | POA: Diagnosis present

## 2018-06-06 DIAGNOSIS — Z820 Family history of epilepsy and other diseases of the nervous system: Secondary | ICD-10-CM | POA: Diagnosis not present

## 2018-06-06 DIAGNOSIS — M109 Gout, unspecified: Secondary | ICD-10-CM | POA: Diagnosis present

## 2018-06-06 DIAGNOSIS — Z951 Presence of aortocoronary bypass graft: Secondary | ICD-10-CM | POA: Diagnosis not present

## 2018-06-06 DIAGNOSIS — Z833 Family history of diabetes mellitus: Secondary | ICD-10-CM | POA: Diagnosis not present

## 2018-06-06 DIAGNOSIS — E785 Hyperlipidemia, unspecified: Secondary | ICD-10-CM | POA: Diagnosis present

## 2018-06-06 DIAGNOSIS — Z8546 Personal history of malignant neoplasm of prostate: Secondary | ICD-10-CM

## 2018-06-06 DIAGNOSIS — Z881 Allergy status to other antibiotic agents status: Secondary | ICD-10-CM

## 2018-06-06 DIAGNOSIS — Z7951 Long term (current) use of inhaled steroids: Secondary | ICD-10-CM

## 2018-06-06 LAB — GLUCOSE, CAPILLARY
Glucose-Capillary: 172 mg/dL — ABNORMAL HIGH (ref 70–99)
Glucose-Capillary: 301 mg/dL — ABNORMAL HIGH (ref 70–99)

## 2018-06-06 LAB — CBC
HEMATOCRIT: 32.2 % — AB (ref 40.0–52.0)
Hemoglobin: 10.9 g/dL — ABNORMAL LOW (ref 13.0–18.0)
MCH: 31.8 pg (ref 26.0–34.0)
MCHC: 33.9 g/dL (ref 32.0–36.0)
MCV: 94 fL (ref 80.0–100.0)
PLATELETS: 206 10*3/uL (ref 150–440)
RBC: 3.43 MIL/uL — AB (ref 4.40–5.90)
RDW: 14.1 % (ref 11.5–14.5)
WBC: 13.9 10*3/uL — AB (ref 3.8–10.6)

## 2018-06-06 LAB — TROPONIN I: Troponin I: 0.04 ng/mL (ref <0.03)

## 2018-06-06 LAB — BASIC METABOLIC PANEL
Anion gap: 14 (ref 5–15)
BUN: 24 mg/dL — AB (ref 8–23)
CHLORIDE: 102 mmol/L (ref 98–111)
CO2: 28 mmol/L (ref 22–32)
CREATININE: 1.46 mg/dL — AB (ref 0.61–1.24)
Calcium: 8.4 mg/dL — ABNORMAL LOW (ref 8.9–10.3)
GFR calc Af Amer: 53 mL/min — ABNORMAL LOW (ref >60)
GFR calc non Af Amer: 46 mL/min — ABNORMAL LOW (ref >60)
Glucose, Bld: 108 mg/dL — ABNORMAL HIGH (ref 70–99)
POTASSIUM: 3.6 mmol/L (ref 3.5–5.1)
SODIUM: 144 mmol/L (ref 135–145)

## 2018-06-06 LAB — BRAIN NATRIURETIC PEPTIDE: B Natriuretic Peptide: 520 pg/mL — ABNORMAL HIGH (ref 0.0–100.0)

## 2018-06-06 MED ORDER — RAMIPRIL 2.5 MG PO CAPS
2.5000 mg | ORAL_CAPSULE | Freq: Every day | ORAL | Status: DC
  Administered 2018-06-07 – 2018-06-08 (×2): 2.5 mg via ORAL
  Filled 2018-06-06 (×2): qty 1

## 2018-06-06 MED ORDER — SPIRONOLACTONE 25 MG PO TABS
25.0000 mg | ORAL_TABLET | Freq: Every day | ORAL | Status: DC
  Administered 2018-06-07 – 2018-06-08 (×2): 25 mg via ORAL
  Filled 2018-06-06 (×2): qty 1

## 2018-06-06 MED ORDER — INSULIN ASPART 100 UNIT/ML ~~LOC~~ SOLN
0.0000 [IU] | Freq: Three times a day (TID) | SUBCUTANEOUS | Status: DC
  Administered 2018-06-06: 2 [IU] via SUBCUTANEOUS
  Administered 2018-06-07 (×2): 5 [IU] via SUBCUTANEOUS
  Filled 2018-06-06 (×3): qty 1

## 2018-06-06 MED ORDER — INSULIN GLARGINE 100 UNIT/ML ~~LOC~~ SOLN
60.0000 [IU] | Freq: Every day | SUBCUTANEOUS | Status: DC
  Administered 2018-06-06 – 2018-06-07 (×2): 60 [IU] via SUBCUTANEOUS
  Filled 2018-06-06 (×3): qty 0.6

## 2018-06-06 MED ORDER — ATORVASTATIN CALCIUM 80 MG PO TABS
80.0000 mg | ORAL_TABLET | Freq: Every evening | ORAL | Status: DC
  Administered 2018-06-06 – 2018-06-07 (×2): 80 mg via ORAL
  Filled 2018-06-06: qty 4
  Filled 2018-06-06: qty 1
  Filled 2018-06-06 (×2): qty 4
  Filled 2018-06-06 (×2): qty 1

## 2018-06-06 MED ORDER — ALBUTEROL SULFATE (2.5 MG/3ML) 0.083% IN NEBU
5.0000 mg | INHALATION_SOLUTION | Freq: Once | RESPIRATORY_TRACT | Status: AC
  Administered 2018-06-06: 5 mg via RESPIRATORY_TRACT
  Filled 2018-06-06: qty 6

## 2018-06-06 MED ORDER — ADULT MULTIVITAMIN W/MINERALS CH
ORAL_TABLET | ORAL | Status: DC
  Administered 2018-06-07 – 2018-06-08 (×2): 1 via ORAL
  Filled 2018-06-06 (×2): qty 1

## 2018-06-06 MED ORDER — TRAZODONE HCL 50 MG PO TABS: 25.0000 mg | ORAL_TABLET | Freq: Every evening | ORAL | Status: DC | PRN

## 2018-06-06 MED ORDER — TORSEMIDE 20 MG PO TABS
20.0000 mg | ORAL_TABLET | Freq: Two times a day (BID) | ORAL | Status: DC
  Administered 2018-06-06 – 2018-06-08 (×4): 20 mg via ORAL
  Filled 2018-06-06 (×4): qty 1

## 2018-06-06 MED ORDER — ALLOPURINOL 100 MG PO TABS
100.0000 mg | ORAL_TABLET | Freq: Every day | ORAL | Status: DC
  Administered 2018-06-07 – 2018-06-08 (×2): 100 mg via ORAL
  Filled 2018-06-06 (×2): qty 1

## 2018-06-06 MED ORDER — IPRATROPIUM-ALBUTEROL 0.5-2.5 (3) MG/3ML IN SOLN
3.0000 mL | Freq: Once | RESPIRATORY_TRACT | Status: AC
  Administered 2018-06-06: 3 mL via RESPIRATORY_TRACT
  Filled 2018-06-06: qty 3

## 2018-06-06 MED ORDER — BISACODYL 5 MG PO TBEC: 5.0000 mg | DELAYED_RELEASE_TABLET | Freq: Every day | ORAL | Status: DC | PRN

## 2018-06-06 MED ORDER — CITALOPRAM HYDROBROMIDE 20 MG PO TABS
20.0000 mg | ORAL_TABLET | Freq: Two times a day (BID) | ORAL | Status: DC
  Administered 2018-06-06 – 2018-06-08 (×4): 20 mg via ORAL
  Filled 2018-06-06 (×4): qty 1

## 2018-06-06 MED ORDER — PANTOPRAZOLE SODIUM 40 MG PO TBEC
80.0000 mg | DELAYED_RELEASE_TABLET | Freq: Two times a day (BID) | ORAL | Status: DC
  Administered 2018-06-06 – 2018-06-08 (×4): 80 mg via ORAL
  Filled 2018-06-06 (×4): qty 2

## 2018-06-06 MED ORDER — TAMSULOSIN HCL 0.4 MG PO CAPS
0.4000 mg | ORAL_CAPSULE | Freq: Every day | ORAL | Status: DC
  Administered 2018-06-07 – 2018-06-08 (×2): 0.4 mg via ORAL
  Filled 2018-06-06 (×2): qty 1

## 2018-06-06 MED ORDER — ACETAMINOPHEN 325 MG PO TABS: 650.0000 mg | ORAL_TABLET | Freq: Four times a day (QID) | ORAL | Status: DC | PRN

## 2018-06-06 MED ORDER — NITROGLYCERIN 0.4 MG SL SUBL: 0.4000 mg | SUBLINGUAL_TABLET | SUBLINGUAL | Status: DC | PRN

## 2018-06-06 MED ORDER — FLUOXETINE HCL 20 MG PO CAPS
20.0000 mg | ORAL_CAPSULE | Freq: Every day | ORAL | Status: DC
  Administered 2018-06-07 – 2018-06-08 (×2): 20 mg via ORAL
  Filled 2018-06-06 (×2): qty 1

## 2018-06-06 MED ORDER — DOCUSATE SODIUM 100 MG PO CAPS
100.0000 mg | ORAL_CAPSULE | Freq: Two times a day (BID) | ORAL | Status: DC
  Administered 2018-06-06 – 2018-06-07 (×3): 100 mg via ORAL
  Filled 2018-06-06 (×4): qty 1

## 2018-06-06 MED ORDER — COENZYME Q10 30 MG PO CAPS: 30.0000 mg | ORAL_CAPSULE | Freq: Every day | ORAL | Status: DC

## 2018-06-06 MED ORDER — CARVEDILOL 3.125 MG PO TABS
3.1250 mg | ORAL_TABLET | Freq: Two times a day (BID) | ORAL | Status: DC
  Administered 2018-06-06 – 2018-06-08 (×4): 3.125 mg via ORAL
  Filled 2018-06-06 (×4): qty 1

## 2018-06-06 MED ORDER — CLOPIDOGREL BISULFATE 75 MG PO TABS
75.0000 mg | ORAL_TABLET | ORAL | Status: DC
  Administered 2018-06-07 – 2018-06-08 (×2): 75 mg via ORAL
  Filled 2018-06-06 (×2): qty 1

## 2018-06-06 MED ORDER — FERROUS SULFATE 325 (65 FE) MG PO TABS
325.0000 mg | ORAL_TABLET | Freq: Two times a day (BID) | ORAL | Status: DC
  Administered 2018-06-06 – 2018-06-08 (×4): 325 mg via ORAL
  Filled 2018-06-06 (×4): qty 1

## 2018-06-06 MED ORDER — GABAPENTIN 300 MG PO CAPS
300.0000 mg | ORAL_CAPSULE | Freq: Two times a day (BID) | ORAL | Status: DC
  Administered 2018-06-06 – 2018-06-08 (×4): 300 mg via ORAL
  Filled 2018-06-06 (×4): qty 1

## 2018-06-06 MED ORDER — IPRATROPIUM-ALBUTEROL 0.5-2.5 (3) MG/3ML IN SOLN
3.0000 mL | RESPIRATORY_TRACT | Status: DC
  Administered 2018-06-06 – 2018-06-07 (×4): 3 mL via RESPIRATORY_TRACT
  Filled 2018-06-06 (×4): qty 3

## 2018-06-06 MED ORDER — METHYLPREDNISOLONE SODIUM SUCC 125 MG IJ SOLR
60.0000 mg | INTRAMUSCULAR | Status: DC
  Administered 2018-06-07: 60 mg via INTRAVENOUS
  Filled 2018-06-06: qty 2

## 2018-06-06 MED ORDER — ONDANSETRON HCL 4 MG/2ML IJ SOLN: 4.0000 mg | Freq: Four times a day (QID) | INTRAMUSCULAR | Status: DC | PRN

## 2018-06-06 MED ORDER — ONDANSETRON HCL 4 MG PO TABS: 4.0000 mg | ORAL_TABLET | Freq: Four times a day (QID) | ORAL | Status: DC | PRN

## 2018-06-06 MED ORDER — MAGNESIUM OXIDE 400 (241.3 MG) MG PO TABS
400.0000 mg | ORAL_TABLET | Freq: Two times a day (BID) | ORAL | Status: DC
  Administered 2018-06-06 – 2018-06-08 (×4): 400 mg via ORAL
  Filled 2018-06-06 (×4): qty 1

## 2018-06-06 MED ORDER — ENOXAPARIN SODIUM 40 MG/0.4ML ~~LOC~~ SOLN
40.0000 mg | SUBCUTANEOUS | Status: DC
  Administered 2018-06-06 – 2018-06-07 (×2): 40 mg via SUBCUTANEOUS
  Filled 2018-06-06 (×2): qty 0.4

## 2018-06-06 MED ORDER — ACETAMINOPHEN 650 MG RE SUPP: 650.0000 mg | Freq: Four times a day (QID) | RECTAL | Status: DC | PRN

## 2018-06-06 MED ORDER — FINASTERIDE 5 MG PO TABS
5.0000 mg | ORAL_TABLET | Freq: Every day | ORAL | Status: DC
  Administered 2018-06-06 – 2018-06-07 (×2): 5 mg via ORAL
  Filled 2018-06-06 (×2): qty 1

## 2018-06-06 MED ORDER — METHYLPREDNISOLONE SODIUM SUCC 125 MG IJ SOLR
125.0000 mg | Freq: Once | INTRAMUSCULAR | Status: AC
  Administered 2018-06-06: 125 mg via INTRAVENOUS
  Filled 2018-06-06: qty 2

## 2018-06-06 MED ORDER — RANOLAZINE ER 500 MG PO TB12
500.0000 mg | ORAL_TABLET | Freq: Two times a day (BID) | ORAL | Status: DC
  Administered 2018-06-06 – 2018-06-08 (×4): 500 mg via ORAL
  Filled 2018-06-06 (×5): qty 1

## 2018-06-06 MED ORDER — SODIUM CHLORIDE 0.9% FLUSH
3.0000 mL | Freq: Two times a day (BID) | INTRAVENOUS | Status: DC
  Administered 2018-06-06 – 2018-06-07 (×3): 3 mL via INTRAVENOUS

## 2018-06-06 NOTE — H&P (Addendum)
[name] NAME: [name]    MR#:  [id number]  DATE OF BIRTH:  Apr 30, 1946  DATE OF ADMISSION:  06/06/2018  PRIMARY CARE PHYSICIAN: [name], MD   REQUESTING/REFERRING PHYSICIAN: Dr. Corky Downs  CHIEF COMPLAINT:   Chief Complaint  Patient presents with  . Shortness of Breath    HISTORY OF PRESENT ILLNESS:  [name]  is a 72 y.o. male with a known history of chronic systolic heart failure with EF of 20%, COPD on nocturnal oxygen has been having shortness of breath that getting worse for the last 3-4 days.  Patient was seen in the emergency room on June 30 and discharged from ER with prednisone.  Patient shortness of breath got worse since last night associated with cough and phlegm, increased oxygen requirement during daytime also.  Patient does not use oxygen during daytime but for the past few days had to use oxygen.  During the daytime also. No extremity edema.  Supposed to go to oncology followed by [facility] today but canceled because of worsening shortness of breath.  Admitting patient for COPD exacerbation. PAST MEDICAL HISTORY:   Past Medical History:  Diagnosis Date  . Anemia   . Cancer ([place]) 12/2013   prostate  . Cardiogenic pulmonary edema ([place]) 12/19/2014  . Cardiomyopathy, ischemic   . CHF (congestive heart failure) ([place])   . COPD (chronic obstructive pulmonary disease) ([place])   . Diabetes mellitus without complication ([place])   . GERD (gastroesophageal reflux disease)   . Hypercholesteremia   . Hypertension   . Myocardial infarction ([place]) [id number]  . Shortness of breath dyspnea     PAST SURGICAL HISTOIRY:   Past Surgical History:  Procedure Laterality Date  . CARDIAC CATHETERIZATION N/A 02/02/2016   Procedure: Left Heart Cath and Coronary Angiography;  Surgeon: [name], MD;  Location: [place] [facility];  Service: Cardiovascular;  Laterality: N/A;  . CORONARY ANGIOPLASTY WITH  STENT PLACEMENT    . CORONARY ARTERY BYPASS GRAFT  11/24/2010  . IMPLANTABLE CARDIOVERTER DEFIBRILLATOR (ICD) GENERATOR CHANGE Left 12/12/2015   Procedure: DUAL LEAD PLACEMENT CARDIAC DIFIBRILLATOR;  Surgeon: [name], MD;  Location: [facility] ORS;  Service: Cardiovascular;  Laterality: Left;  . TONSILLECTOMY      SOCIAL HISTORY:   Social History   Tobacco Use  . Smoking status: Former Smoker    Packs/day: 2.00    Years: 30.00    Pack years: 60.00    Last attempt to quit: 12/03/1996    Years since quitting: 21.5  . Smokeless tobacco: Never Used  Substance Use Topics  . Alcohol use: No    Alcohol/week: 0.0 oz    FAMILY HISTORY:   Family History  Problem Relation Age of Onset  . CAD Mother   . Cancer Mother   . Diabetes Mother   . Alzheimer's disease Father   . Cancer Father   . Heart disease Father     DRUG ALLERGIES:   Allergies  Allergen Reactions  . Benadryl [Diphenhydramine] Other (See Comments)    " Hyperactivity"  . Doxycycline Swelling    Pt went into pulmonary edema.  . Lopid [Gemfibrozil] Swelling    "I gain 1 pound a day for 30 days."    REVIEW OF SYSTEMS:  CONSTITUTIONAL: No fever, fatigue or weakness.  EYES: No blurred or double vision.  EARS, NOSE, AND THROAT: No tinnitus or ear pain.  RESPIRATORY: Cough, worsening shortness of breath for the past  3-4 days CARDIOVASCULAR: No chest pain, orthopnea, edema.  GASTROINTESTINAL: No nausea, vomiting, diarrhea or abdominal pain.  GENITOURINARY: No dysuria, hematuria.  ENDOCRINE: No polyuria, nocturia,  HEMATOLOGY: No anemia, easy bruising or bleeding SKIN: No rash or lesion. MUSCULOSKELETAL: No joint pain or arthritis.   NEUROLOGIC: No tingling, numbness, weakness.  PSYCHIATRY: No anxiety or depression.   MEDICATIONS AT HOME:   Prior to Admission medications   Medication Sig Start Date End Date Taking? Authorizing Provider  albuterol (PROVENTIL HFA;VENTOLIN HFA) 108 (90 Base) MCG/ACT inhaler  Inhale 2 puffs into the lungs every 6 (six) hours as needed for wheezing or shortness of breath. 06/04/18  Yes Alfred Levins, [name], MD  albuterol-ipratropium (COMBIVENT) 18-103 MCG/ACT inhaler Inhale 1 puff into the lungs 2 times daily at 12 noon and 4 pm.   Yes [provider]  allopurinol (ZYLOPRIM) 100 MG tablet Take 100 mg by mouth daily.   Yes [provider]  atorvastatin (LIPITOR) 80 MG tablet Take 1 tablet (80 mg total) by mouth every evening. 04/21/17  Yes Theodoro [name], MD  budesonide-formoterol (SYMBICORT) 80-4.5 MCG/ACT inhaler Inhale 2 puffs into the lungs 2 (two) times daily.   Yes [provider]  calcium carbonate (TUMS - DOSED IN MG ELEMENTAL CALCIUM) 500 MG chewable tablet Chew 1 tablet by mouth as needed for indigestion or heartburn.   Yes [provider]  carvedilol (COREG) 3.125 MG tablet Take 1 tablet (3.125 mg total) by mouth 2 (two) times daily with a meal. 01/01/18  Yes Gladstone [name], MD  citalopram (CELEXA) 20 MG tablet Take 20 mg by mouth 2 (two) times daily.    Yes [provider]  clopidogrel (PLAVIX) 75 MG tablet Take 75 mg by mouth every morning.   Yes [provider]  co-enzyme Q-10 30 MG capsule Take 30 mg by mouth daily.   Yes [provider]  ferrous sulfate 325 (65 FE) MG tablet Take 325 mg by mouth 2 (two) times daily with a meal.   Yes [provider]  finasteride (PROSCAR) 5 MG tablet Take 5 mg by mouth at bedtime.   Yes [provider]  FLUoxetine (PROZAC) 20 MG capsule Take 20 mg by mouth daily.   Yes [provider]  gabapentin (NEURONTIN) 300 MG capsule Take 300 mg by mouth 2 (two) times daily.    Yes [provider]  insulin glargine (LANTUS) 100 UNIT/ML injection Inject 0.6 mLs (60 Units total) into the skin at bedtime. 01/01/18  Yes Gladstone [name], MD  insulin lispro (HUMALOG) 100 UNIT/ML KiwkPen Inject 0-18 Units into the skin 3 (three) times daily with  meals. Per sliding scale   Yes [provider]  loratadine (CLARITIN) 10 MG tablet Take 10 mg by mouth daily.   Yes [provider]  Magnesium Oxide 400 (240 Mg) MG TABS Take 1 tablet (400 mg total) by mouth 2 (two) times daily. 05/30/18  Yes Hackney, Otila Kluver A, FNP  Multiple Vitamins-Minerals (CENTRUM SILVER PO) Take 1 tablet by mouth every morning.   Yes [provider]  nitroGLYCERIN (NITROSTAT) 0.4 MG SL tablet Place 0.4 mg under the tongue every 5 (five) minutes as needed for chest pain.   Yes [provider]  pantoprazole (PROTONIX) 40 MG tablet Take 80 mg by mouth 2 (two) times daily.    Yes [provider]  predniSONE (DELTASONE) 20 MG tablet Take 3 tablets (60 mg total) by mouth daily for 4 days. 06/04/18 06/08/18 Yes Rudene Re, MD  ramipril (  ALTACE) 2.5 MG capsule Take 1 capsule (2.5 mg total) by mouth daily. 02/19/18 02/19/19 Yes Wieting, Richard, MD  ranolazine (RANEXA) 500 MG 12 hr tablet Take 1 tablet (500 mg total) by mouth 2 (two) times daily. 05/30/18  Yes Hackney, Otila Kluver A, FNP  spironolactone (ALDACTONE) 25 MG tablet Take 25 mg by mouth daily.    Yes [provider]  tamsulosin (FLOMAX) 0.4 MG CAPS capsule Take 0.4 mg by mouth daily.   Yes [provider]  torsemide (DEMADEX) 20 MG tablet 1 tab po twice a day.  For weight gain of 3 lbs in one day or five lbs in a week then increase to 2 tabs in am and one in afternoon Patient taking differently: Take 20 mg by mouth 2 (two) times daily. 1 tab po twice a day.  For weight gain of 3 lbs in one day or five lbs in a week then increase to 2 tabs in am and one in afternoon 02/19/18  Yes Wieting, Richard, MD      VITAL SIGNS:  Blood pressure (!) 120/53, pulse 86, temperature 98.6 F (37 C), temperature source Oral, resp. rate 15, height 5\' 7"  (1.702 m), weight 112 kg (246 lb 14.6 oz), SpO2 98 %.  PHYSICAL EXAMINATION:  GENERAL:  72 y.o.-year-old patient lying in the bed with no  acute distress.  EYES: Pupils equal, round, reactive to light and accommodation. No scleral icterus. Extraocular muscles intact.  HEENT: Head atraumatic, normocephalic. Oropharynx and nasopharynx clear.  NECK:  Supple, no jugular venous distention. No thyroid enlargement, no tenderness.  LUNGS: scattered wheezing bilaterally.  CARDIOVASCULAR: S1, S2 normal. No murmurs, rubs, or gallops.  ABDOMEN: Soft, nontender, nondistended. Bowel sounds present. No organomegaly or mass.  EXTREMITIES: No pedal edema, cyanosis, or clubbing.  NEUROLOGIC: Cranial nerves II through XII are intact. Muscle strength 5/5 in all extremities. Sensation intact. Gait not checked.  PSYCHIATRIC: The patient is alert and oriented x 3.  SKIN: No obvious rash, lesion, or ulcer.   LABORATORY PANEL:   CBC Recent Labs  Lab 06/06/18 1310  WBC 13.9*  HGB 10.9*  HCT 32.2*  PLT 206   ------------------------------------------------------------------------------------------------------------------  Chemistries  Recent Labs  Lab 06/06/18 1310  NA 144  K 3.6  CL 102  CO2 28  GLUCOSE 108*  BUN 24*  CREATININE 1.46*  CALCIUM 8.4*   ------------------------------------------------------------------------------------------------------------------  Cardiac Enzymes Recent Labs  Lab 06/06/18 1310  TROPONINI 0.04*   ------------------------------------------------------------------------------------------------------------------  RADIOLOGY:  Dg Chest 2 View  Result Date: 06/06/2018 CLINICAL DATA:  Shortness of breath worse today, chest tightness EXAM: CHEST - 2 VIEW COMPARISON:  Chest x-ray of 06/04/2018 FINDINGS: Mild cardiomegaly stable and pacer and AICD leads remain. No definite pneumonia or effusion is seen with probable mild blunting of the costophrenic angle either due to pleural thickening or tiny pleural effusion. 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 No bony abnormality is seen other than  degenerative change throughout the mid to lower thoracic spine. IMPRESSION: 1. Stable cardiomegaly with improvement in pulmonary vascular congestion. 2. Cannot exclude tiny left effusion. Electronically Signed   By: Ivar Drape M.D.   On: 06/06/2018 13:40   Dg Chest 2 View  Result Date: 06/04/2018 CLINICAL DATA:  Shortness of breath and chest pain EXAM: CHEST - 2 VIEW COMPARISON:  02/19/2018, 02/18/2018 FINDINGS: Sternotomy changes. Similar appearance of left-sided pacing device. Mild cardiomegaly with central vascular congestion. No pleural effusion or focal consolidation. No pneumothorax. Degenerative changes of the spine. IMPRESSION:  Mild cardiomegaly with vascular congestion. Electronically Signed   By: Donavan Foil M.D.   On: 06/04/2018 19:28    EKG:   Orders placed or performed during the hospital encounter of 06/06/18  . ED EKG  . ED EKG  . EKG 12-Lead  . EKG 12-Lead  Atrial paced rhythm, with no ST-T changes.  IMPRESSION AND PLAN:   72 year old patient with multiple medical problems of hypertension, hyperlipidemia, diabetes mellitus type 2, prostate cancer, COPD, previous tobacco abuse, chronic systolic heart failure with EF 20-25% followed by heart failure clinic, history of CAD, CABG comes in with worsening shortness of breath for 3 days found to have COPD exacerbation without pneumonia.  Patient has been using oxygen during daytime also for the past few days.  Usually patient uses oxygen with CPAP at night. #1 COPD exacerbation: Continue bronchodilators, IV steroids, oxygen.  Hold off on empiric antibiotics at this time.  Patient has leukocytosis secondary to prednisone that he is taking that was prescribed in the emergency room 2 days ago. 2.  Chronic systolic heart failure with EF 20-25% and history of CAD, CABG: Followed by heart failure clinic and follows up with Dr. Nehemiah Massed.  Continue torsemide, low-salt diet.  Reportedly eats at [employer] 7 days a week as per CHF clinic.   Advised the patient to be compliant with diet. 3.  Diabetes mellitus type 2: Patient is on Lantus, acting insulin with meals.  Continue Lantus, ordered sliding scale insulin. 4. Obstructive sleep apnea: CPAP at night with oxygen 5 L. 5, acute on chronic renal failure, CKD stage III: Slight worsening of kidney function.. Monitor closely. 6.  Chronic angina: Patient is on Ranexa.   #7 history of prostate cancer followed by [facility]. All the records are reviewed and case discussed with ED provider. Management plans discussed with the patient, family and they are in agreement.  CODE STATUS: full.  TOTAL TIME TAKING CARE OF THIS PATIENT: 55 minutes.    Epifanio Lesches M.D on 06/06/2018 at 4:19 PM  Between 7am to 6pm - Pager - [phone]  After 6pm go to [email or web address] - password [email or web address] [facility]  Office  [phone]  CC: Primary care physician; [name], MD  Note: This dictation was prepared with Dragon dictation along with smaller phrase technology. Any transcriptional errors that result from this process are unintentional.

## 2018-06-06 NOTE — Progress Notes (Signed)
PHARMACIST - PHYSICIAN ORDER COMMUNICATION  CONCERNING: P&T Medication Policy on Herbal Medications  DESCRIPTION:  This patient's order for:  co-enzyme Q-10 capsule 30 mg  has been noted.  This product(s) is classified as an "herbal" or natural product. Due to a lack of definitive safety studies or FDA approval, nonstandard manufacturing practices, plus the potential risk of unknown drug-drug interactions while on inpatient medications, the Pharmacy and Therapeutics Committee does not permit the use of "herbal" or natural products of this type within [facility].   ACTION TAKEN: The pharmacy department is unable to verify this order at this time. Please reevaluate patient's clinical condition at discharge and address if the herbal or natural product(s) should be resumed at that time.  Pernell Dupre, PharmD, BCPS Clinical Pharmacist 06/06/2018 5:01 PM

## 2018-06-06 NOTE — ED Provider Notes (Signed)
[facility] Emergency Department Provider Note   ____________________________________________    I have reviewed the triage vital signs and the nursing notes.   HISTORY  Chief Complaint Shortness of Breath     HPI [name] is a 72 y.o. male who presents with complaints of shortness of breath.  Patient has a history of diabetes COPD, CHF, recently discharged from the hospital 2 days ago, he is on oxygen and CPAP at night.  States he got up around 1130 today because he went to bed late last night and felt so weak and short of breath that he cannot do anything.  He denies fevers or chills.  Does report cough.  No chest pain nausea or vomiting.  Reports compliance with his medications  Past Medical History:  Diagnosis Date  . Anemia   . Cancer ([place]) 12/2013   prostate  . Cardiogenic pulmonary edema ([place]) 12/19/2014  . Cardiomyopathy, ischemic   . CHF (congestive heart failure) ([place])   . COPD (chronic obstructive pulmonary disease) ([place])   . Diabetes mellitus without complication ([place])   . GERD (gastroesophageal reflux disease)   . Hypercholesteremia   . Hypertension   . Myocardial infarction ([place]) [id number]  . Shortness of breath dyspnea     Patient Active Problem List   Diagnosis Date Noted  . Acute on chronic systolic CHF (congestive heart failure) ([place]) 04/21/2017  . Hypokalemia 04/21/2017  . Hypomagnesemia 04/21/2017  . Essential hypertension 04/21/2017  . Hyperlipidemia 04/21/2017  . Coronary artery disease 04/21/2017  . COPD with chronic bronchitis ([place]) 02/13/2016  . Obstructive sleep apnea 02/13/2016  . Diabetes ([place]) 02/13/2016  . Chronic systolic HF (heart failure) ([place]) 12/12/2015    Past Surgical History:  Procedure Laterality Date  . CARDIAC CATHETERIZATION N/A 02/02/2016   Procedure: Left Heart Cath and Coronary Angiography;  Surgeon: [name], MD;  Location: [place] [facility];  Service: Cardiovascular;   Laterality: N/A;  . CORONARY ANGIOPLASTY WITH STENT PLACEMENT    . CORONARY ARTERY BYPASS GRAFT  11/24/2010  . IMPLANTABLE CARDIOVERTER DEFIBRILLATOR (ICD) GENERATOR CHANGE Left 12/12/2015   Procedure: DUAL LEAD PLACEMENT CARDIAC DIFIBRILLATOR;  Surgeon: [name], MD;  Location: [facility] ORS;  Service: Cardiovascular;  Laterality: Left;  . TONSILLECTOMY      Prior to Admission medications   Medication Sig Start Date End Date Taking? Authorizing Provider  albuterol (PROVENTIL HFA;VENTOLIN HFA) 108 (90 Base) MCG/ACT inhaler Inhale 2 puffs into the lungs 4 (four) times daily as needed for wheezing or shortness of breath.     [provider]  albuterol (PROVENTIL HFA;VENTOLIN HFA) 108 (90 Base) MCG/ACT inhaler Inhale 2 puffs into the lungs every 6 (six) hours as needed for wheezing or shortness of breath. 06/04/18   Rudene Re, MD  albuterol-ipratropium (COMBIVENT) 18-103 MCG/ACT inhaler Inhale 1 puff into the lungs 2 times daily at 12 noon and 4 pm.    [provider]  allopurinol (ZYLOPRIM) 100 MG tablet Take 100 mg by mouth daily.    [provider]  atorvastatin (LIPITOR) 80 MG tablet Take 1 tablet (80 mg total) by mouth every evening. 04/21/17   Theodoro [name], MD  budesonide-formoterol (SYMBICORT) 80-4.5 MCG/ACT inhaler Inhale 2 puffs into the lungs 2 (two) times daily.    [provider]  calcium carbonate (TUMS - DOSED IN MG ELEMENTAL CALCIUM) 500 MG chewable tablet Chew 1 tablet by mouth as needed for indigestion or heartburn.    [provider]  carvedilol (COREG) 3.125 MG tablet Take 1 tablet (3.125 mg total) by mouth 2 (two) times daily with a meal. 01/01/18   Gladstone [name], MD  citalopram (CELEXA) 20 MG tablet Take 1 tablet by mouth 2 (two) times daily.    [provider]  clopidogrel (PLAVIX) 75 MG tablet Take 75 mg by mouth every morning.    [provider]  co-enzyme Q-10 30 MG capsule Take 30 mg by mouth daily.     [provider]  ferrous sulfate 325 (65 FE) MG tablet Take 325 mg by mouth 2 (two) times daily with a meal.    [provider]  finasteride (PROSCAR) 5 MG tablet Take 5 mg by mouth at bedtime.    [provider]  FLUoxetine (PROZAC) 20 MG capsule Take 20 mg by mouth daily.    [provider]  gabapentin (NEURONTIN) 300 MG capsule Take 300 mg by mouth 2 (two) times daily.     [provider]  insulin glargine (LANTUS) 100 UNIT/ML injection Inject 0.6 mLs (60 Units total) into the skin at bedtime. Patient taking differently: Inject 100 Units into the skin at bedtime.  01/01/18   Gladstone [name], MD  insulin lispro (HUMALOG) 100 UNIT/ML KiwkPen Inject 0-18 Units into the skin 3 (three) times daily with meals. Per sliding scale    [provider]  loratadine (CLARITIN) 10 MG tablet Take 10 mg by mouth daily.    [provider]  Magnesium Oxide 400 (240 Mg) MG TABS Take 1 tablet (400 mg total) by mouth 2 (two) times daily. 05/30/18   Alisa Graff, FNP  Multiple Vitamins-Minerals (CENTRUM SILVER PO) Take 1 tablet by mouth every morning.    [provider]  nitroGLYCERIN (NITROSTAT) 0.4 MG SL tablet Place 0.4 mg under the tongue every 5 (five) minutes as needed for chest pain.    [provider]  pantoprazole (PROTONIX) 40 MG tablet Take 80 mg by mouth 2 (two) times daily.     [provider]  predniSONE (DELTASONE) 20 MG tablet Take 3 tablets (60 mg total) by mouth daily for 4 days. 06/04/18 06/08/18  Rudene Re, MD  ramipril (ALTACE) 2.5 MG capsule Take 1 capsule (2.5 mg total) by mouth daily. 02/19/18 02/19/19  Loletha Grayer, MD  ranolazine (RANEXA) 500 MG 12 hr tablet Take 1 tablet (500 mg total) by mouth 2 (two) times daily. 05/30/18   Alisa Graff, FNP  spironolactone (ALDACTONE) 25 MG tablet Take 25 mg by mouth daily.     [provider]  tamsulosin (FLOMAX) 0.4 MG CAPS capsule Take 0.4  mg by mouth daily.    [provider]  torsemide (DEMADEX) 20 MG tablet 1 tab po twice a day.  For weight gain of 3 lbs in one day or five lbs in a week then increase to 2 tabs in am and one in afternoon 02/19/18   Loletha Grayer, MD     Allergies Benadryl [diphenhydramine]; Doxycycline; and Lopid [gemfibrozil]  Family History  Problem Relation Age of Onset  . CAD Mother   . Cancer Mother   . Diabetes Mother   . Alzheimer's disease Father   . Cancer Father   . Heart disease Father     Social History Social History   Tobacco Use  . Smoking status: Former Smoker    Packs/day: 2.00    Years: 30.00    Pack years: 60.00    Last attempt to quit: 12/03/1996  Years since quitting: 21.5  . Smokeless tobacco: Never Used  Substance Use Topics  . Alcohol use: No    Alcohol/week: 0.0 oz  . Drug use: No    Review of Systems  Constitutional: No fever/chills Eyes: No visual changes.  ENT: No sore throat. Cardiovascular: Denies chest pain. Respiratory: As above Gastrointestinal: No abdominal pain.  No nausea, no vomiting.   Genitourinary: Negative for dysuria. Musculoskeletal: Negative for back pain. Skin: Negative for rash. Neurological: Negative for headaches   ____________________________________________   PHYSICAL EXAM:  VITAL SIGNS: ED Triage Vitals  Enc Vitals Group     BP 06/06/18 1254 124/62     Pulse Rate 06/06/18 1254 96     Resp 06/06/18 1254 (!) 27     Temp 06/06/18 1254 98.6 F (37 C)     Temp Source 06/06/18 1254 Oral     SpO2 06/06/18 1254 95 %     Weight 06/06/18 1255 112 kg (246 lb 14.6 oz)     Height 06/06/18 1255 1.702 m (5\' 7" )     Head Circumference --      Peak Flow --      Pain Score 06/06/18 1255 2     Pain Loc --      Pain Edu? --      Excl. in [place]? --     Constitutional: Alert and oriented. No acute distress. Pleasant and interactive Eyes: Conjunctivae are normal.  Nose: No congestion/rhinnorhea. Mouth/Throat: Mucous  membranes are moist.   Cardiovascular: Normal rate, regular rhythm. Grossly normal heart sounds.  Good peripheral circulation. Respiratory: Increased respiratory effort, on oxygen, scattered wheezes Gastrointestinal: Soft and nontender. No distention.  No CVA tenderness.  Musculoskeletal: No lower extremity tenderness nor edema.  Warm and well perfused Neurologic:  Normal speech and language. No gross focal neurologic deficits are appreciated.  Skin:  Skin is warm, dry and intact. No rash noted. Psychiatric: Mood and affect are normal. Speech and behavior are normal.  ____________________________________________   LABS (all labs ordered are listed, but only abnormal results are displayed)  Labs Reviewed  BASIC METABOLIC PANEL - Abnormal; Notable for the following components:      Result Value   Glucose, Bld 108 (*)    BUN 24 (*)    Creatinine, Ser 1.46 (*)    Calcium 8.4 (*)    GFR calc non Af Amer 46 (*)    GFR calc Af Amer 53 (*)    All other components within normal limits  CBC - Abnormal; Notable for the following components:   WBC 13.9 (*)    RBC 3.43 (*)    Hemoglobin 10.9 (*)    HCT 32.2 (*)    All other components within normal limits  TROPONIN I - Abnormal; Notable for the following components:   Troponin I 0.04 (*)    All other components within normal limits  BRAIN NATRIURETIC PEPTIDE - Abnormal; Notable for the following components:   B Natriuretic Peptide 520.0 (*)    All other components within normal limits   ____________________________________________  EKG  ED ECG REPORT I, Lavonia Drafts, the attending physician, personally viewed and interpreted this ECG.  Date: 06/06/2018  Rhythm: Atrial paced rhythm QRS Axis: normal Intervals: normal ST/T Wave abnormalities: Nonspecific changes Narrative Interpretation: no evidence of acute ischemia  ____________________________________________  RADIOLOGY  No pulmonary edema or  pneumonia ____________________________________________   PROCEDURES  Procedure(s) performed: No  Procedures   Critical Care performed: No ____________________________________________   INITIAL IMPRESSION /  ASSESSMENT AND PLAN / ED COURSE  Pertinent labs & imaging results that were available during my care of the patient were reviewed by me and considered in my medical decision making (see chart for details).  With a history of CHF/COPD, wheezes on exam, suspicious for COPD exacerbation.  Will give albuterol nebulizer, check labs, chest x-ray and monitor carefully  Chest x-ray negative for pulmonary edema, suspect this is more COPD exacerbation, will add Solu-Medrol and additional DuoNeb's.  He continues to require oxygen, will discuss with hospitalist for admission    ____________________________________________   FINAL CLINICAL IMPRESSION(S) / ED DIAGNOSES  Final diagnoses:  COPD exacerbation ([place])  Shortness of breath        Note:  This document was prepared using Dragon voice recognition software and may include unintentional dictation errors.    Lavonia Drafts, MD 06/06/18 [phone]

## 2018-06-06 NOTE — Plan of Care (Signed)
  Problem: Education: Goal: Knowledge of General Education information will improve Outcome: Progressing   Problem: Clinical Measurements: Goal: Ability to maintain clinical measurements within normal limits will improve Outcome: Progressing Goal: Respiratory complications will improve Outcome: Progressing   Problem: Safety: Goal: Ability to remain free from injury will improve Outcome: Progressing

## 2018-06-06 NOTE — ED Triage Notes (Signed)
Patient from home via [facility]. Reports shortness of breath worsening this morning. States he was seen in ED and discharged Sunday for similar symptoms. Given IV lasix during that visit with improvement of symptoms. Patient denies fever. History of COPD and CHF. Speaking in full sentences, but increased work of breathing noted. Per EMS, patient was 88% on RA upon their arrival, patient placed on 2L and improved to 95%.

## 2018-06-06 NOTE — Progress Notes (Signed)
Home checked out and functioning properly , will  notify  the biomedical engineerring

## 2018-06-07 LAB — GLUCOSE, CAPILLARY
GLUCOSE-CAPILLARY: 271 mg/dL — AB (ref 70–99)
GLUCOSE-CAPILLARY: 157 mg/dL — AB (ref 70–99)
Glucose-Capillary: 259 mg/dL — ABNORMAL HIGH (ref 70–99)
Glucose-Capillary: 213 mg/dL — ABNORMAL HIGH (ref 70–99)

## 2018-06-07 LAB — BASIC METABOLIC PANEL
Anion gap: 12 (ref 5–15)
BUN: 31 mg/dL — ABNORMAL HIGH (ref 8–23)
CALCIUM: 8.2 mg/dL — AB (ref 8.9–10.3)
CHLORIDE: 100 mmol/L (ref 98–111)
CO2: 26 mmol/L (ref 22–32)
Creatinine, Ser: 1.49 mg/dL — ABNORMAL HIGH (ref 0.61–1.24)
GFR calc non Af Amer: 45 mL/min — ABNORMAL LOW (ref >60)
GFR, EST AFRICAN AMERICAN: 52 mL/min — AB (ref >60)
Glucose, Bld: 309 mg/dL — ABNORMAL HIGH (ref 70–99)
POTASSIUM: 3.9 mmol/L (ref 3.5–5.1)
Sodium: 138 mmol/L (ref 135–145)

## 2018-06-07 LAB — CBC
HCT: 29.7 % — ABNORMAL LOW (ref 40.0–52.0)
HEMOGLOBIN: 10.2 g/dL — AB (ref 13.0–18.0)
MCH: 32 pg (ref 26.0–34.0)
MCHC: 34.2 g/dL (ref 32.0–36.0)
MCV: 93.5 fL (ref 80.0–100.0)
Platelets: 167 10*3/uL (ref 150–440)
RBC: 3.17 MIL/uL — ABNORMAL LOW (ref 4.40–5.90)
RDW: 13.9 % (ref 11.5–14.5)
WBC: 9.2 10*3/uL (ref 3.8–10.6)

## 2018-06-07 MED ORDER — GUAIFENESIN ER 600 MG PO TB12
600.0000 mg | ORAL_TABLET | Freq: Two times a day (BID) | ORAL | Status: DC
  Administered 2018-06-07 – 2018-06-08 (×3): 600 mg via ORAL
  Filled 2018-06-07 (×3): qty 1

## 2018-06-07 MED ORDER — INSULIN ASPART 100 UNIT/ML ~~LOC~~ SOLN
0.0000 [IU] | Freq: Three times a day (TID) | SUBCUTANEOUS | Status: DC
  Administered 2018-06-07: 3 [IU] via SUBCUTANEOUS
  Administered 2018-06-08: 5 [IU] via SUBCUTANEOUS
  Filled 2018-06-07 (×2): qty 1

## 2018-06-07 MED ORDER — INSULIN ASPART 100 UNIT/ML ~~LOC~~ SOLN
0.0000 [IU] | Freq: Every day | SUBCUTANEOUS | Status: DC
  Administered 2018-06-07: 2 [IU] via SUBCUTANEOUS
  Filled 2018-06-07: qty 1

## 2018-06-07 MED ORDER — ACETYLCYSTEINE 20 % IN SOLN
4.0000 mL | Freq: Three times a day (TID) | RESPIRATORY_TRACT | Status: DC
  Administered 2018-06-07 – 2018-06-08 (×3): 4 mL via RESPIRATORY_TRACT
  Filled 2018-06-07 (×6): qty 4

## 2018-06-07 MED ORDER — BUDESONIDE 0.5 MG/2ML IN SUSP
0.5000 mg | Freq: Two times a day (BID) | RESPIRATORY_TRACT | Status: DC
  Administered 2018-06-07 – 2018-06-08 (×2): 0.5 mg via RESPIRATORY_TRACT
  Filled 2018-06-07 (×2): qty 2

## 2018-06-07 MED ORDER — IPRATROPIUM-ALBUTEROL 0.5-2.5 (3) MG/3ML IN SOLN
3.0000 mL | Freq: Four times a day (QID) | RESPIRATORY_TRACT | Status: DC
  Administered 2018-06-07 – 2018-06-08 (×3): 3 mL via RESPIRATORY_TRACT
  Filled 2018-06-07 (×4): qty 3

## 2018-06-07 NOTE — Progress Notes (Signed)
[place] at [place] NAME: [name]    MR#:  [id number]  DATE OF BIRTH:  1946-10-17  SUBJECTIVE:   Patient here due to shortness of breath and noted to be in COPD exacerbation.  Still having some wheezing and exertional dyspnea.  Complains of a cough which is nonproductive.  REVIEW OF SYSTEMS:    Review of Systems  Constitutional: Negative for chills and fever.  HENT: Negative for congestion and tinnitus.   Eyes: Negative for blurred vision and double vision.  Respiratory: Positive for cough, shortness of breath and wheezing.   Cardiovascular: Negative for chest pain, orthopnea and PND.  Gastrointestinal: Negative for abdominal pain, diarrhea, nausea and vomiting.  Genitourinary: Negative for dysuria and hematuria.  Neurological: Negative for dizziness, sensory change and focal weakness.  All other systems reviewed and are negative.   Nutrition: Heart Healthy/Carb control Tolerating Diet: Yes Tolerating PT: Await Eval.   DRUG ALLERGIES:   Allergies  Allergen Reactions  . Benadryl [Diphenhydramine] Other (See Comments)    " Hyperactivity"  . Doxycycline Swelling    Pt went into pulmonary edema.  . Lopid [Gemfibrozil] Swelling    "I gain 1 pound a day for 30 days."    VITALS:  Blood pressure (!) 124/58, pulse 70, temperature 97.8 F (36.6 C), temperature source Oral, resp. rate 18, height 5\' 7"  (1.702 m), weight 109.9 kg (242 lb 4.8 oz), SpO2 96 %.  PHYSICAL EXAMINATION:   Physical Exam  GENERAL:  72 y.o.-year-old obese patient lying in bed in NAD.  EYES: Pupils equal, round, reactive to light and accommodation. No scleral icterus. Extraocular muscles intact.  HEENT: Head atraumatic, normocephalic. Oropharynx and nasopharynx clear.  NECK:  Supple, no jugular venous distention. No thyroid enlargement, no tenderness.  LUNGS: Normal breath sounds bilaterally, diffuse end-exp. Wheezing b/l, No rales, rhonchi. No use of  accessory muscles of respiration.  CARDIOVASCULAR: S1, S2 normal. No murmurs, rubs, or gallops.  ABDOMEN: Soft, nontender, nondistended. Bowel sounds present. No organomegaly or mass.  EXTREMITIES: No cyanosis, clubbing or edema b/l.    NEUROLOGIC: Cranial nerves II through XII are intact. No focal Motor or sensory deficits b/l.  Globally weak.  PSYCHIATRIC: The patient is alert and oriented x 3.  SKIN: No obvious rash, lesion, or ulcer.    LABORATORY PANEL:   CBC Recent Labs  Lab 06/07/18 0426  WBC 9.2  HGB 10.2*  HCT 29.7*  PLT 167   ------------------------------------------------------------------------------------------------------------------  Chemistries  Recent Labs  Lab 06/07/18 0426  NA 138  K 3.9  CL 100  CO2 26  GLUCOSE 309*  BUN 31*  CREATININE 1.49*  CALCIUM 8.2*   ------------------------------------------------------------------------------------------------------------------  Cardiac Enzymes Recent Labs  Lab 06/06/18 1310  TROPONINI 0.04*   ------------------------------------------------------------------------------------------------------------------  RADIOLOGY:  Dg Chest 2 View  Result Date: 06/06/2018 CLINICAL DATA:  Shortness of breath worse today, chest tightness EXAM: CHEST - 2 VIEW COMPARISON:  Chest x-ray of 06/04/2018 FINDINGS: Mild cardiomegaly stable and pacer and AICD leads remain. No definite pneumonia or effusion is seen with probable mild blunting of the costophrenic angle either due to pleural thickening or tiny pleural effusion. 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 No bony abnormality is seen other than degenerative change throughout the mid to lower thoracic spine. IMPRESSION: 1. Stable cardiomegaly with improvement in pulmonary vascular congestion. 2. Cannot exclude tiny left effusion. Electronically Signed   By: Ivar Drape M.D.   On: 06/06/2018 13:40  ASSESSMENT AND PLAN:   72 year old male with past  medical history of diabetes, COPD, GERD, hypertension, hyperlipidemia, ischemic cardiomyopathy who presented to the hospital due to shortness of breath.  1.  COPD exacerbation-this is a cause of patient's worsening shortness of breath, wheezing and bronchospasm. - Continue IV steroids, continue scheduled duo nebs, will add some Pulmicort nebs. -Patient having a cough which is nonproductive.  Will give flutter valve, Mucinex and Mucomyst nebs. -Patient is already on oxygen at home.  2.  Chronic systolic CHF-patient has a severe ischemic cardiomyopathy EF of 20 to 25%. -Clinically patient appears well compesated.  Continue torsemide, Aldactone, carvedilol, Ramipril.   3. Hx of CAD - no acute chest pain.  - cont. Plavix, Statin, Ranexa  4. GERD - pt. Will cont. Protonix.   5. BPH - pt. Will cont. Flomax. No urinary retention.   6.  Diabetes type 2 without complication- continue Lantus, sliding scale insulin.  7.  Hyperlipidemia-continue atorvastatin.  8.  History of gout-no acute attack.  Continue allopurinol.   All the records are reviewed and case discussed with Care Management/Social Worker. Management plans discussed with the patient, family and they are in agreement.  CODE STATUS: Full code  DVT Prophylaxis: Lovenox  TOTAL TIME TAKING CARE OF THIS PATIENT: 30 minutes.   POSSIBLE D/C IN 1-2 DAYS, DEPENDING ON CLINICAL CONDITION.   Henreitta Leber M.D on 06/07/2018 at 1:25 PM  Between 7am to 6pm - Pager - [phone]  After 6pm go to [email or web address] - Proofreader  [facility]  Office  [phone]  CC: Primary care physician; [name], MD

## 2018-06-07 NOTE — Progress Notes (Signed)
Inpatient Diabetes Program Recommendations  AACE/ADA: New Consensus Statement on Inpatient Glycemic Control (2015)  Target Ranges:  Prepandial:   less than 140 mg/dL      Peak postprandial:   less than 180 mg/dL (1-2 hours)      Critically ill patients:  140 - 180 mg/dL   Lab Results  Component Value Date   GLUCAP 259 (H) 06/07/2018   HGBA1C 5.9 (H) 12/05/2017    Review of Glycemic Control Results for DEDRICK, [name] (MRN [id number]) as of 06/07/2018 11:40  Ref. Range 06/06/2018 17:16 06/06/2018 21:07 06/07/2018 07:59 06/07/2018 11:23  Glucose-Capillary Latest Ref Range: 70 - 99 mg/dL 172 (H) 301 (H) 271 (H) 259 (H)   Diabetes history: Type 2 DM Outpatient Diabetes medications: Lantus 60 units QHS, Humalog 0-18 units TID Current orders for Inpatient glycemic control: Lantus 60 units QHS, Novolog 0-9 units TID, Solumedrol 60 mg Q24H  Inpatient Diabetes Program Recommendations:    In setting of steroids, consider increasing Lantus to 70 units QHS.   Thanks, Bronson Curb, MSN, RNC-OB Diabetes Coordinator [phone] (8a-5p)

## 2018-06-08 LAB — BASIC METABOLIC PANEL
Anion gap: 9 (ref 5–15)
BUN: 37 mg/dL — ABNORMAL HIGH (ref 8–23)
CO2: 28 mmol/L (ref 22–32)
Calcium: 8.4 mg/dL — ABNORMAL LOW (ref 8.9–10.3)
Chloride: 101 mmol/L (ref 98–111)
Creatinine, Ser: 1.47 mg/dL — ABNORMAL HIGH (ref 0.61–1.24)
GFR calc non Af Amer: 46 mL/min — ABNORMAL LOW (ref >60)
GFR, EST AFRICAN AMERICAN: 53 mL/min — AB (ref >60)
Glucose, Bld: 282 mg/dL — ABNORMAL HIGH (ref 70–99)
POTASSIUM: 4 mmol/L (ref 3.5–5.1)
SODIUM: 138 mmol/L (ref 135–145)

## 2018-06-08 LAB — GLUCOSE, CAPILLARY: GLUCOSE-CAPILLARY: 241 mg/dL — AB (ref 70–99)

## 2018-06-08 MED ORDER — PREDNISONE 10 MG PO TABS
30.0000 mg | ORAL_TABLET | Freq: Once | ORAL | Status: AC
  Administered 2018-06-08: 30 mg via ORAL
  Filled 2018-06-08: qty 1

## 2018-06-08 MED ORDER — TIOTROPIUM BROMIDE MONOHYDRATE 18 MCG IN CAPS: 18.0000 ug | ORAL_CAPSULE | Freq: Every day | RESPIRATORY_TRACT | 0 refills | Status: DC

## 2018-06-08 MED ORDER — TIOTROPIUM BROMIDE MONOHYDRATE 18 MCG IN CAPS
18.0000 ug | ORAL_CAPSULE | Freq: Every day | RESPIRATORY_TRACT | Status: DC
  Administered 2018-06-08: 18 ug via RESPIRATORY_TRACT
  Filled 2018-06-08: qty 5

## 2018-06-08 MED ORDER — TORSEMIDE 20 MG PO TABS: 20.0000 mg | ORAL_TABLET | Freq: Two times a day (BID) | ORAL | Status: DC

## 2018-06-08 MED ORDER — PREDNISONE 20 MG PO TABS: ORAL_TABLET | ORAL | 0 refills | Status: DC

## 2018-06-08 NOTE — Progress Notes (Signed)
IV and tele removed from patient. Discharge instructions given to patient. Verbalized understanding. No acute distress at this time. Wife to transport patient home.  

## 2018-06-08 NOTE — Discharge Summary (Signed)
[place] at [place] NAME: [name]    MR#:  [id number]  DATE OF BIRTH:  12-20-1945  DATE OF ADMISSION:  06/06/2018 ADMITTING PHYSICIAN: Epifanio Lesches, MD  DATE OF DISCHARGE: 06/08/2018 11:04 AM  PRIMARY CARE PHYSICIAN: [name], MD    ADMISSION DIAGNOSIS:  Shortness of breath [R06.02] COPD exacerbation (HCC) [J44.1]  DISCHARGE DIAGNOSIS:  Active Problems:   COPD exacerbation ([place])   SECONDARY DIAGNOSIS:   Past Medical History:  Diagnosis Date  . Anemia   . Cancer ([place]) 12/2013   prostate  . 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 Cardiomyopathy, ischemic   . CHF (congestive heart failure) ([place])   . COPD (chronic obstructive pulmonary disease) ([place])   . Diabetes mellitus without complication ([place])   . GERD (gastroesophageal reflux disease)   . Hypercholesteremia   . Hypertension   . Myocardial infarction ([place]) [id number]  . Shortness of breath dyspnea     HOSPITAL COURSE:   1.  COPD exacerbation.  Patient was given IV Solu-Medrol and nebulizer treatments while here.  The patient already has oral prednisone at home.  I switched over to oral prednisone this morning and he will continue prednisone for another 2 days.  Add Spiriva.  Continue Symbicort and albuterol inhaler. 2.  Chronic systolic heart failure with EF of 20 to 25%.  Continue his usual medications including torsemide, spironolactone, ramipril and Coreg.  I do not believe this was heart failure on this hospitalization. 3.  Type 2 diabetes mellitus.  Patient on Lantus and sliding scale.  Sugars will be a little bit high while on steroids. 4.  Obstructive sleep apnea on CPAP with oxygen at night 5.  Chronic kidney disease stage III. 6.   Hyperlipidemia unspecified on atorvastatin 7.  History of gout on allopurinol 8.  Depression on Celexa 9.  BPH on Proscar and Flomax   DISCHARGE CONDITIONS:   Satisfactory  CONSULTS OBTAINED:   None  DRUG ALLERGIES:   Allergies  Allergen Reactions  . Benadryl [Diphenhydramine] Other (See Comments)    " Hyperactivity"  . Doxycycline Swelling    Pt went into pulmonary edema.  . Lopid [Gemfibrozil] Swelling    "I gain 1 pound a day for 30 days."    DISCHARGE MEDICATIONS:   Allergies as of 06/08/2018      Reactions   Benadryl [diphenhydramine] Other (See Comments)   " Hyperactivity"   Doxycycline Swelling   Pt went into pulmonary edema.   Lopid [gemfibrozil] Swelling   "I gain 1 pound a day for 30 days."      Medication List    STOP taking these medications   albuterol-ipratropium 18-103 MCG/ACT inhaler Commonly known as:  COMBIVENT     TAKE these medications   albuterol 108 (90 Base) MCG/ACT inhaler Commonly known as:  PROVENTIL HFA;VENTOLIN HFA Inhale 2 puffs into the lungs every 6 (six) hours as needed for wheezing or shortness of breath.   allopurinol 100 MG tablet Commonly known as:  ZYLOPRIM Take 100 mg by mouth daily.   atorvastatin 80 MG tablet Commonly known as:  LIPITOR Take 1 tablet (80 mg total) by mouth every evening.   budesonide-formoterol 80-4.5 MCG/ACT inhaler Commonly known as:  SYMBICORT Inhale 2 puffs into the lungs 2 (two) times daily.   calcium carbonate 500 MG chewable tablet Commonly known as:  TUMS - dosed in mg elemental calcium Chew 1 tablet by mouth as needed for indigestion or heartburn.  carvedilol 3.125 MG tablet Commonly known as:  COREG Take 1 tablet (3.125 mg total) by mouth 2 (two) times daily with a meal.   CENTRUM SILVER PO Take 1 tablet by mouth every morning.   citalopram 20 MG tablet Commonly known as:  CELEXA Take 20 mg by mouth 2 (two) times daily.   clopidogrel 75 MG tablet Commonly known as:  PLAVIX Take 75 mg by mouth every morning.   co-enzyme Q-10 30 MG capsule Take 30 mg by mouth daily.   ferrous sulfate 325 (65 FE) MG tablet Take 325 mg by mouth 2 (two) times daily with a meal.    finasteride 5 MG tablet Commonly known as:  PROSCAR Take 5 mg by mouth at bedtime.   FLUoxetine 20 MG capsule Commonly known as:  PROZAC Take 20 mg by mouth daily.   gabapentin 300 MG capsule Commonly known as:  NEURONTIN Take 300 mg by mouth 2 (two) times daily.   insulin glargine 100 UNIT/ML injection Commonly known as:  LANTUS Inject 0.6 mLs (60 Units total) into the skin at bedtime.   insulin lispro 100 UNIT/ML KiwkPen Commonly known as:  HUMALOG Inject 0-18 Units into the skin 3 (three) times daily with meals. Per sliding scale   loratadine 10 MG tablet Commonly known as:  CLARITIN Take 10 mg by mouth daily.   Magnesium Oxide 400 (240 Mg) MG Tabs Take 1 tablet (400 mg total) by mouth 2 (two) times daily.   nitroGLYCERIN 0.4 MG SL tablet Commonly known as:  NITROSTAT Place 0.4 mg under the tongue every 5 (five) minutes as needed for chest pain.   pantoprazole 40 MG tablet Commonly known as:  PROTONIX Take 80 mg by mouth 2 (two) times daily.   predniSONE 20 MG tablet Commonly known as:  DELTASONE Take 1 and 1/2 tabs po daily for two days (total 30mg ) starting July 5th What changed:    how much to take  how to take this  when to take this  additional instructions   ramipril 2.5 MG capsule Commonly known as:  ALTACE Take 1 capsule (2.5 mg total) by mouth daily.   ranolazine 500 MG 12 hr tablet Commonly known as:  RANEXA Take 1 tablet (500 mg total) by mouth 2 (two) times daily.   spironolactone 25 MG tablet Commonly known as:  ALDACTONE Take 25 mg by mouth daily.   tamsulosin 0.4 MG Caps capsule Commonly known as:  FLOMAX Take 0.4 mg by mouth daily.   tiotropium 18 MCG inhalation capsule Commonly known as:  SPIRIVA Place 1 capsule (18 mcg total) into inhaler and inhale daily.   torsemide 20 MG tablet Commonly known as:  DEMADEX Take 1 tablet (20 mg total) by mouth 2 (two) times daily. 1 tab po twice a day.  For weight gain of 3 lbs in one day  or five lbs in a week then increase to 2 tabs in am and one in afternoon What changed:    how much to take  how to take this  when to take this        DISCHARGE INSTRUCTIONS:    Follow-up PMD 6 days  If you experience worsening of your admission symptoms, develop shortness of breath, life threatening emergency, suicidal or homicidal thoughts you must seek medical attention immediately by calling 911 or calling your MD immediately  if symptoms less severe.  You Must read complete instructions/literature along with all the possible adverse reactions/side effects for all the Medicines you  take and that have been prescribed to you. Take any new Medicines after you have completely understood and accept all the possible adverse reactions/side effects.   Please note  You were cared for by a hospitalist during your hospital stay. If you have any questions about your discharge medications or the care you received while you were in the hospital after you are discharged, you can call the unit and asked to speak with the hospitalist on call if the hospitalist that took care of you is not available. Once you are discharged, your primary care physician will handle any further medical issues. Please note that NO REFILLS for any discharge medications will be authorized once you are discharged, as it is imperative that you return to your primary care physician (or establish a relationship with a primary care physician if you do not have one) for your aftercare needs so that they can reassess your need for medications and monitor your lab values.    Today   CHIEF COMPLAINT:   Chief Complaint  Patient presents with  . Shortness of Breath    HISTORY OF PRESENT ILLNESS:  [name]  is a 72 y.o. male presented back with shortness of breath   VITAL SIGNS:  Blood pressure (!) 121/59, pulse 76, temperature 98.3 F (36.8 C), temperature source Oral, resp. rate 18, height 5\' 7"  (1.702 m),  weight 110.3 kg (243 lb 3.2 oz), SpO2 93 %.   PHYSICAL EXAMINATION:  GENERAL:  73 y.o.-year-old patient lying in the bed with no acute distress.  EYES: Pupils equal, round, reactive to light and accommodation. No scleral icterus. Extraocular muscles intact.  HEENT: Head atraumatic, normocephalic. Oropharynx and nasopharynx clear.  NECK:  Supple, no jugular venous distention. No thyroid enlargement, no tenderness.  LUNGS: Normal breath sounds bilaterally, no wheezing, rales,rhonchi or crepitation. No use of accessory muscles of respiration.  CARDIOVASCULAR: S1, S2 normal. No murmurs, rubs, or gallops.  ABDOMEN: Soft, non-tender, non-distended. Bowel sounds present. No organomegaly or mass.  EXTREMITIES: No pedal edema, cyanosis, or clubbing.  NEUROLOGIC: Cranial nerves II through XII are intact. Muscle strength 5/5 in all extremities. Sensation intact. Gait not checked.  PSYCHIATRIC: The patient is alert and oriented x 3.  SKIN: No obvious rash, lesion, or ulcer.   DATA REVIEW:   CBC Recent Labs  Lab 06/07/18 0426  WBC 9.2  HGB 10.2*  HCT 29.7*  PLT 167    Chemistries  Recent Labs  Lab 06/08/18 0232  NA 138  K 4.0  CL 101  CO2 28  GLUCOSE 282*  BUN 37*  CREATININE 1.47*  CALCIUM 8.4*    Cardiac Enzymes Recent Labs  Lab 06/06/18 1310  TROPONINI 0.04*    Microbiology Results  Results for orders placed or performed during the hospital encounter of 12/30/17  MRSA PCR Screening     Status: None   Collection Time: 12/30/17  3:43 AM  Result Value Ref Range Status   MRSA by PCR NEGATIVE NEGATIVE Final    Comment:        The GeneXpert MRSA Assay (FDA approved for NASAL specimens only), is one component of a comprehensive MRSA colonization surveillance program. It is not intended to diagnose MRSA infection nor to guide or monitor treatment for MRSA infections. Performed at [facility], [address] [place], [place] [address]     RADIOLOGY:  Dg  Chest 2 View  Result Date: 06/06/2018 CLINICAL DATA:  Shortness of breath worse today, chest tightness EXAM: CHEST -  2 VIEW COMPARISON:  Chest x-ray of 06/04/2018 FINDINGS: Mild cardiomegaly stable and pacer and AICD leads remain. No definite pneumonia or effusion is seen with probable mild blunting of the costophrenic angle either due to pleural thickening or tiny pleural effusion. 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 No bony abnormality is seen other than degenerative change throughout the mid to lower thoracic spine. IMPRESSION: 1. Stable cardiomegaly with improvement in pulmonary vascular congestion. 2. Cannot exclude tiny left effusion. Electronically Signed   By: Ivar Drape M.D.   On: 06/06/2018 13:40    Management plans discussed with the patient, family and they are in agreement.  CODE STATUS:     Code Status Orders  (From admission, onward)        Start     Ordered   06/06/18 1526  Full code  Continuous     06/06/18 1529    Code Status History    Date Active Date Inactive Code Status Order ID Comments User Context   02/18/2018 1248 02/19/2018 1854 Full Code [id number]  Idelle Crouch, MD Inpatient   12/30/2017 0305 01/01/2018 1609 Full Code [id number]  Harrie Foreman, MD ED   12/05/2017 1105 12/06/2017 1659 Full Code [id number]  Harrie Foreman, MD ED   07/17/2017 0439 07/18/2017 1759 Full Code [id number]  Harrie Foreman, MD Inpatient   07/01/2017 0119 07/02/2017 1546 Full Code [id number]  Lance Coon, MD Inpatient   04/20/2017 0232 04/21/2017 1816 Full Code [id number]  Saundra Shelling, MD Inpatient   01/31/2016 0143 02/02/2016 2111 Full Code [id number]  Hillary Bow, MD ED   12/12/2015 1627 12/13/2015 1529 Full Code [id number]  [name], MD Inpatient      TOTAL TIME TAKING CARE OF THIS PATIENT: 35 minutes.    Loletha Grayer M.D on 06/08/2018 at 1:04 PM  Between 7am to 6pm - Pager - [phone]  After 6pm go to [email or web address] - password [email or web address] [place]  Physicians Office  [phone]  CC: Primary care physician; [name], MD

## 2018-06-12 ENCOUNTER — Other Ambulatory Visit: Payer: Self-pay

## 2018-06-12 NOTE — Patient Outreach (Signed)
[facility]) Care Management  06/12/2018  [name] Apr 19, 1946 [id number]  72 year old male outreached by [facility] services for 30 day post discharge medication review.  PMHx includes, but not limited to, systolic heart failure with EF 20-25%, hypertension, coronary artery disease, COPD, Type 2 diabetes mellitus and hyperlipidemia.  Successful outreach attempt to [name].  HIPAA identifiers verified.  Patient states that he is in [place] and requests that I call him back Friday afternoon.  Plan: Outreach attempt Friday afternoon.  Joetta Manners, PharmD Clinical Pharmacist [place] [phone]

## 2018-06-13 DIAGNOSIS — R0609 Other forms of dyspnea: Secondary | ICD-10-CM | POA: Diagnosis not present

## 2018-06-13 DIAGNOSIS — Z09 Encounter for follow-up examination after completed treatment for conditions other than malignant neoplasm: Secondary | ICD-10-CM | POA: Diagnosis not present

## 2018-06-13 DIAGNOSIS — G4733 Obstructive sleep apnea (adult) (pediatric): Secondary | ICD-10-CM | POA: Diagnosis not present

## 2018-06-13 DIAGNOSIS — I255 Ischemic cardiomyopathy: Secondary | ICD-10-CM | POA: Diagnosis not present

## 2018-06-13 DIAGNOSIS — I5023 Acute on chronic systolic (congestive) heart failure: Secondary | ICD-10-CM | POA: Diagnosis not present

## 2018-06-13 DIAGNOSIS — E782 Mixed hyperlipidemia: Secondary | ICD-10-CM | POA: Diagnosis not present

## 2018-06-13 DIAGNOSIS — E669 Obesity, unspecified: Secondary | ICD-10-CM | POA: Diagnosis not present

## 2018-06-13 DIAGNOSIS — Z951 Presence of aortocoronary bypass graft: Secondary | ICD-10-CM | POA: Diagnosis not present

## 2018-06-13 DIAGNOSIS — I10 Essential (primary) hypertension: Secondary | ICD-10-CM | POA: Diagnosis not present

## 2018-06-13 DIAGNOSIS — E1169 Type 2 diabetes mellitus with other specified complication: Secondary | ICD-10-CM | POA: Diagnosis not present

## 2018-06-13 DIAGNOSIS — I25810 Atherosclerosis of coronary artery bypass graft(s) without angina pectoris: Secondary | ICD-10-CM | POA: Diagnosis not present

## 2018-06-13 DIAGNOSIS — I5022 Chronic systolic (congestive) heart failure: Secondary | ICD-10-CM | POA: Diagnosis not present

## 2018-06-16 ENCOUNTER — Ambulatory Visit: Payer: Self-pay

## 2018-06-16 ENCOUNTER — Other Ambulatory Visit: Payer: Self-pay

## 2018-06-16 ENCOUNTER — Ambulatory Visit: Payer: PPO

## 2018-06-16 NOTE — Patient Outreach (Signed)
[facility]) Care Management  06/16/2018  GIAN YBARRA [birth year] [id number]  72 year old male outreached by [facility] services for 30 day post discharge medication review.  PMHx includes, but not limited to, systolic heart failure with EF 20-25%, hypertension, coronary artery disease, COPD, Type 2 diabetes mellitus and hyperlipidemia.  Successful outreach attempt to Mr. Mcneeley.  HIPAA identifiers verified.  Patient states that he is home from [place], but not at home.  He requests that I outreach to him anytime next week in the afternoon.  Plan: Outreach attempt #3 next week in the afternoon.  Joetta Manners, PharmD Clinical Pharmacist [place] [phone]

## 2018-06-19 ENCOUNTER — Other Ambulatory Visit: Payer: Self-pay

## 2018-06-19 ENCOUNTER — Ambulatory Visit: Payer: Self-pay

## 2018-06-19 NOTE — Patient Outreach (Signed)
[facility]) Care Management  [place]   06/19/2018  [name] 12/05/1946 [id number]  72 year old male outreached by [place] services for 30 day post discharge medication review.  PMHx includes, but not limited to, systolic heart failure with EF 20-25%, hypertension, coronary artery disease, COPD, Type 2 diabetes mellitus and hyperlipidemia.  Successful outreach attempt to [name].  HIPAA identifiers verified.    Subjective: [name] reports that he is doing well, however, throughout our whole conversation he was short of breath.  He reports that his breathing was his "normal."   He states that he has a son that is a Airline pilot and EMT that lives beside of him, along with his wife to take care of him. He reports that he weighs himself daily with range of 240-245 lbs (241 lbs today.)  He states that he goes to the Heart Failure Clinic about every 2 months.  He states that he checks his CBGs every morning with these recording the past week (77 113, 88, 142, 64, 108, 140).  He states that he gets jittery and breaks out in a cold sweat when his glucose drops and reports eating one small Hershey bar or a swiss cake roll when low.  He reports that he does not eat as he the right foods.  [name] states that he receives 100% disability from the [place] and gets all his prescriptions from them, so affordability is not an issue. Patient agreed to [facility] telephonic disease state management.   Objective:  HgA1c 5.9% in 12/18 SCr 1.47 mg/dL  EF 20-25%  Current Medications: Current Outpatient Medications  Medication Sig Dispense Refill  . acetaminophen (TYLENOL) 500 MG tablet Take 1,000 mg by mouth daily as needed for moderate pain.    Marland Kitchen albuterol (PROVENTIL HFA;VENTOLIN HFA) 108 (90 Base) MCG/ACT inhaler Inhale 2 puffs into the lungs every 6 (six) hours as needed for wheezing or shortness of breath. 1 Inhaler 2  . allopurinol (ZYLOPRIM) 100 MG tablet Take  100 mg by mouth daily.    Marland Kitchen atorvastatin (LIPITOR) 80 MG tablet Take 1 tablet (80 mg total) by mouth every evening. 30 tablet 3  . budesonide-formoterol (SYMBICORT) 80-4.5 MCG/ACT inhaler Inhale 2 puffs into the lungs 2 (two) times daily.    . calcium carbonate (TUMS - DOSED IN MG ELEMENTAL CALCIUM) 500 MG chewable tablet Chew 1 tablet by mouth as needed for indigestion or heartburn.    . carvedilol (COREG) 3.125 MG tablet Take 1 tablet (3.125 mg total) by mouth 2 (two) times daily with a meal. 60 tablet 2  . clopidogrel (PLAVIX) 75 MG tablet Take 75 mg by mouth every morning.    Marland Kitchen co-enzyme Q-10 30 MG capsule Take 30 mg by mouth daily.    . ferrous sulfate 325 (65 FE) MG tablet Take 325 mg by mouth 2 (two) times daily with a meal.    . FLUoxetine (PROZAC) 20 MG capsule Take 20 mg by mouth daily.    Marland Kitchen gabapentin (NEURONTIN) 300 MG capsule Take 300 mg by mouth 2 (two) times daily.     . insulin glargine (LANTUS) 100 UNIT/ML injection Inject 0.6 mLs (60 Units total) into the skin at bedtime. (Patient taking differently: Inject 60 Units into the skin at bedtime. Take 60-80 units at bedtime depending on what he has eaten.) 10 mL 11  . insulin lispro (HUMALOG) 100 UNIT/ML KiwkPen Inject 0-18 Units into the skin 3 (three) times daily with meals. Per  sliding scale    . lansoprazole (PREVACID) 15 MG capsule Take 30 mg by mouth 2 (two) times daily.    Marland Kitchen loratadine (CLARITIN) 10 MG tablet Take 10 mg by mouth daily.    . Magnesium Oxide 400 (240 Mg) MG TABS Take 1 tablet (400 mg total) by mouth 2 (two) times daily. 90 tablet 3  . Multiple Vitamins-Minerals (CENTRUM SILVER PO) Take 1 tablet by mouth every morning.    . Multiple Vitamins-Minerals (PRESERVISION AREDS 2) CAPS Take 1 capsule by mouth 2 (two) times daily. For macular degeneration- eye vitamins    . nitroGLYCERIN (NITROSTAT) 0.4 MG SL tablet Place 0.4 mg under the tongue every 5 (five) minutes as needed for chest pain.    . ranolazine (RANEXA) 500  MG 12 hr tablet Take 1 tablet (500 mg total) by mouth 2 (two) times daily. 180 tablet 3  . spironolactone (ALDACTONE) 25 MG tablet Take 25 mg by mouth daily.     . tamsulosin (FLOMAX) 0.4 MG CAPS capsule Take 0.4 mg by mouth at bedtime.     Marland Kitchen tiotropium (SPIRIVA) 18 MCG inhalation capsule Place 1 capsule (18 mcg total) into inhaler and inhale daily. 30 capsule 0  . torsemide (DEMADEX) 20 MG tablet Take 1 tablet (20 mg total) by mouth 2 (two) times daily. 1 tab po twice a day.  For weight gain of 3 lbs in one day or five lbs in a week then increase to 2 tabs in am and one in afternoon     No current facility-administered medications for this visit.     Functional Status: In your present state of health, do you have any difficulty performing the following activities: 06/06/2018 05/30/2018  Hearing? Y N  Vision? N N  Difficulty concentrating or making decisions? N N  Walking or climbing stairs? Y Y  Dressing or bathing? N N  Doing errands, shopping? N N  Some recent data might be hidden    Fall/Depression Screening: Fall Risk  05/30/2018 03/21/2018 03/01/2018  Falls in the past year? Yes No No  Number falls in past yr: 1 - -  Injury with Fall? No - -  Risk for fall due to : Impaired balance/gait - -  Risk for fall due to: Comment - - -  Follow up - - -   PHQ 2/9 Scores 05/30/2018 03/21/2018 03/01/2018 02/21/2018 02/13/2018 01/05/2018 01/18/2017  PHQ - 2 Score 0 0 0 0 0 0 0  PHQ- 9 Score - - - - - - -   ASSESSMENT: Date Discharged from Hospital: 06/08/18 Date Medication Reconciliation Performed: 06/19/2018  Medications Discontinued at Discharge:  Combivent  New Medications at Discharge:   Prednisone  Torsemide (Directions changed)  Patient was recently discharged from hospital and all medications have been reviewed  Drugs sorted by system:  Neurologic/Psychologic: fluoxetine, gabapentin,   Cardiovascular: atorvastatin, carvedilol, clopidogrel, nitroglycerin, ranolazine,  spironolactone, torsemide  Pulmonary/Allergy: albuterol, budesonide/formoterol, loratadine, tiotropium   Gastrointestinal: calcium carbonate, lansoprazole  Endocrine: insulin glargine, insulin lispro  Pain: acetaminophen  Vitamins/Minerals: co-enzyme Q10, ferrous sulfate, magnesium oxide, MVI, Preservision  AREDS  Miscellaneous: allopurinol, tamsulosin,   Gaps in therapy:  ACE or ARB- patient reports his ramipril 2.5 mg daily was discontinued.  In-basket message sent to Dr. Nehemiah Massed, cardiology, for clarification.  Medications to avoid in the elderly:  Per the [place] List, lansoprazole may increase risk of C. difficile bone loss & fractures.  Avoid scheduled use for >8 weeks unless high risk (oral corticosteroids, chronic NSAIDs,  erosive esophagitis, hypersecretory conditions or demonstrated need for maintenance.)   Drug interactions:  Lansoprazole and clopidogrel- Use of lansoprazole may lead to reduced ability of clopidogrel to inhibit platelets aggregation and increase the risk of subsequent cardiovascular events.   Other issues noted:  Patient reports that he was still using his Combivent inhaler, despite it being discontinued at hospital discharge on 06/08/18.  Informed him that Combivent was discontinued.   Patient reports using Symbicort prn when he feels he is having trouble breathing.  Informed him that Symbicort is his maintenance medication and should be used as prescribed ( 2 puffs twice a day).  Extensively reviewed all his inhalers, how they work and how he should use them.  Patient verbalized understanding.      Plan: Follow up with Dr. Nehemiah Massed to determine if Mr. Crisp should be taking ramipril.  Follow up with patient to ensure the that he is taking his Symbicort and other inhalers correctly.   Enroll in [facility] disease state management program.  Joetta Manners, [place] [phone]

## 2018-06-26 ENCOUNTER — Ambulatory Visit: Payer: Self-pay

## 2018-06-26 ENCOUNTER — Other Ambulatory Visit: Payer: Self-pay

## 2018-06-26 NOTE — Patient Outreach (Signed)
[facility]) Care Management  06/26/2018  [name] [birth year] [id number]  Incoming call received from Dr. Alveria Apley office.  Spoke with Etta Quill, NP who last saw [name].  She confirmed that patient should be on ramipril 2.5 mg daily.  He has an appointment at the Heart Failure clinic this week.   Outgoing call placed to [name].  HIPAA identifiers verified.  Informed patient that he should be taking ramipril 2.5 mg daily.  He reports he has ramipril and will start taking it again.  Also, reviewed how he is using his using his inhalers.  He was able to correctly verbalize his inhaler regimen.   Patient requested a copy of his Epic medication list.   Plan: Send Epic medication list to patient.   Joetta Manners, PharmD Clinical Pharmacist [place] [phone]

## 2018-06-29 ENCOUNTER — Ambulatory Visit: Payer: PPO | Attending: Family | Admitting: Family

## 2018-06-29 ENCOUNTER — Encounter: Payer: Self-pay | Admitting: Family

## 2018-06-29 VITALS — BP 101/48 | HR 90 | Resp 18 | Wt 246.1 lb

## 2018-06-29 DIAGNOSIS — G4733 Obstructive sleep apnea (adult) (pediatric): Secondary | ICD-10-CM | POA: Diagnosis not present

## 2018-06-29 DIAGNOSIS — D649 Anemia, unspecified: Secondary | ICD-10-CM | POA: Diagnosis not present

## 2018-06-29 DIAGNOSIS — I110 Hypertensive heart disease with heart failure: Secondary | ICD-10-CM | POA: Insufficient documentation

## 2018-06-29 DIAGNOSIS — Z8546 Personal history of malignant neoplasm of prostate: Secondary | ICD-10-CM | POA: Diagnosis not present

## 2018-06-29 DIAGNOSIS — J441 Chronic obstructive pulmonary disease with (acute) exacerbation: Secondary | ICD-10-CM | POA: Diagnosis not present

## 2018-06-29 DIAGNOSIS — Z881 Allergy status to other antibiotic agents status: Secondary | ICD-10-CM | POA: Insufficient documentation

## 2018-06-29 DIAGNOSIS — Z888 Allergy status to other drugs, medicaments and biological substances status: Secondary | ICD-10-CM | POA: Insufficient documentation

## 2018-06-29 DIAGNOSIS — Z87891 Personal history of nicotine dependence: Secondary | ICD-10-CM | POA: Diagnosis not present

## 2018-06-29 DIAGNOSIS — E7800 Pure hypercholesterolemia, unspecified: Secondary | ICD-10-CM | POA: Insufficient documentation

## 2018-06-29 DIAGNOSIS — I10 Essential (primary) hypertension: Secondary | ICD-10-CM

## 2018-06-29 DIAGNOSIS — E119 Type 2 diabetes mellitus without complications: Secondary | ICD-10-CM | POA: Diagnosis not present

## 2018-06-29 DIAGNOSIS — I255 Ischemic cardiomyopathy: Secondary | ICD-10-CM | POA: Diagnosis not present

## 2018-06-29 DIAGNOSIS — E1143 Type 2 diabetes mellitus with diabetic autonomic (poly)neuropathy: Secondary | ICD-10-CM

## 2018-06-29 DIAGNOSIS — Z951 Presence of aortocoronary bypass graft: Secondary | ICD-10-CM | POA: Diagnosis not present

## 2018-06-29 DIAGNOSIS — I5022 Chronic systolic (congestive) heart failure: Secondary | ICD-10-CM

## 2018-06-29 DIAGNOSIS — R0609 Other forms of dyspnea: Secondary | ICD-10-CM | POA: Diagnosis not present

## 2018-06-29 DIAGNOSIS — Z79899 Other long term (current) drug therapy: Secondary | ICD-10-CM | POA: Insufficient documentation

## 2018-06-29 DIAGNOSIS — I252 Old myocardial infarction: Secondary | ICD-10-CM | POA: Insufficient documentation

## 2018-06-29 DIAGNOSIS — Z955 Presence of coronary angioplasty implant and graft: Secondary | ICD-10-CM | POA: Insufficient documentation

## 2018-06-29 DIAGNOSIS — Z794 Long term (current) use of insulin: Secondary | ICD-10-CM | POA: Diagnosis not present

## 2018-06-29 DIAGNOSIS — K219 Gastro-esophageal reflux disease without esophagitis: Secondary | ICD-10-CM | POA: Diagnosis not present

## 2018-06-29 DIAGNOSIS — I2510 Atherosclerotic heart disease of native coronary artery without angina pectoris: Secondary | ICD-10-CM | POA: Insufficient documentation

## 2018-06-29 NOTE — Patient Instructions (Signed)
Continue weighing daily and call for an overnight weight gain of > 2 pounds or a weekly weight gain of >5 pounds. 

## 2018-06-29 NOTE — Progress Notes (Signed)
Patient ID: [name], male    DOB: 1946-01-30, 72 y.o.   MRN: [id number]  HPI  [name] is a 72 y/o male with a history of prostate cancer, DM, hyperlipidemia, HTN, anemia, COPD, GERD, MI, previous tobacco use and chronic heart failure.   Echo report from 02/18/18 reviewed and showed an EF of 20-25% along with trivial AR and moderate Daniel. Echo report from 12/05/17 reviewed and showed an EF of 20-25% along with mild Daniel and moderate Daniel. Cardiac catheterization done 02/02/16 showed severed 3-vessel CAD with patent grafts. Continue medication management along with dual antiplatelet therapy.   Admitted 06/06/18 due to COPD exacerbation. Given IV solu-medrol and nebulizer treatments and then switched or oral prednisone. Spiriva was added to regimen. HF was stable and he was discharged after 2 days. Was in the ED 06/04/18 for shortness of breath where he was treated and released. Admitted 02/18/18 due to HF exacerbation. Cardiology consult obtained. Initially given IV lasix and then transitioned to oral diuretics. Needed oxygen and bipap initially as well. Supplemented with magnesium and discharged the following day per patient's request. Was in the ED 02/12/18 due to HF exacerbation. He had missed his most recent HF Clinic appointment. Given one dose of IV lasix and released.   He presents today for a follow-up visit with a chief complaint of moderate shortness of breath upon minimal exertion. He describes this as chronic in nature having been present for several years although he does feel like it's worse then it has been. He has associated fatigue, light-headedness and easy bruising along with this. He denies any difficulty sleeping, abdominal distention, palpitations, pedal edema, chest pain, cough or weight gain. Has noted some hoarseness and he says that he's been rinsing his mouth after his inhalers but then swallowing the rinse instead of spitting it out.   Past Medical History:  Diagnosis Date   . Anemia   . Cancer ([place]) 12/2013   prostate  . 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 Cardiomyopathy, ischemic   . CHF (congestive heart failure) ([place])   . COPD (chronic obstructive pulmonary disease) ([place])   . Diabetes mellitus without complication ([place])   . GERD (gastroesophageal reflux disease)   . Hypercholesteremia   . Hypertension   . Myocardial infarction ([place]) [id number]  . Shortness of breath dyspnea    Past Surgical History:  Procedure Laterality Date  . CARDIAC CATHETERIZATION N/A 02/02/2016   Procedure: Left Heart Cath and Coronary Angiography;  Surgeon: [name], MD;  Location: [facility];  Service: Cardiovascular;  Laterality: N/A;  . CORONARY ANGIOPLASTY WITH STENT PLACEMENT    . CORONARY ARTERY BYPASS GRAFT  11/24/2010  . IMPLANTABLE CARDIOVERTER DEFIBRILLATOR (ICD) GENERATOR CHANGE Left 12/12/2015   Procedure: DUAL LEAD PLACEMENT CARDIAC DIFIBRILLATOR;  Surgeon: [name], MD;  Location: [facility] ORS;  Service: Cardiovascular;  Laterality: Left;  . TONSILLECTOMY     Family History  Problem Relation Age of Onset  . CAD Mother   . Cancer Mother   . Diabetes Mother   . Alzheimer's disease Father   . Cancer Father   . Heart disease Father    Social History   Tobacco Use  . Smoking status: Former Smoker    Packs/day: 2.00    Years: 30.00    Pack years: 60.00    Last attempt to quit: 12/03/1996    Years since quitting: 21.5  . Smokeless tobacco: Never Used  Substance Use Topics  . Alcohol  use: No    Alcohol/week: 0.0 oz   Allergies  Allergen Reactions  . Benadryl [Diphenhydramine] Other (See Comments)    " Hyperactivity"  . Doxycycline Swelling    Pt went into pulmonary edema.  . Lopid [Gemfibrozil] Swelling    "I gain 1 pound a day for 30 days."   Prior to Admission medications   Medication Sig Start Date End Date Taking? Authorizing Provider  acetaminophen (TYLENOL) 500 MG tablet Take 1,000 mg by mouth daily as  needed for moderate pain.   Yes [provider]  albuterol (PROVENTIL HFA;VENTOLIN HFA) 108 (90 Base) MCG/ACT inhaler Inhale 2 puffs into the lungs every 6 (six) hours as needed for wheezing or shortness of breath. 06/04/18  Yes [name], [name], MD  allopurinol (ZYLOPRIM) 100 MG tablet Take 100 mg by mouth daily.   Yes [provider]  atorvastatin (LIPITOR) 80 MG tablet Take 1 tablet (80 mg total) by mouth every evening. 04/21/17  Yes Theodoro [name], MD  budesonide-formoterol (SYMBICORT) 80-4.5 MCG/ACT inhaler Inhale 2 puffs into the lungs 2 (two) times daily.   Yes [provider]  calcium carbonate (TUMS - DOSED IN MG ELEMENTAL CALCIUM) 500 MG chewable tablet Chew 1 tablet by mouth as needed for indigestion or heartburn.   Yes [provider]  carvedilol (COREG) 3.125 MG tablet Take 1 tablet (3.125 mg total) by mouth 2 (two) times daily with a meal. 01/01/18  Yes Gladstone [name], MD  clopidogrel (PLAVIX) 75 MG tablet Take 75 mg by mouth every morning.   Yes [provider]  co-enzyme Q-10 30 MG capsule Take 30 mg by mouth daily.   Yes [provider]  ferrous sulfate 325 (65 FE) MG tablet Take 325 mg by mouth 2 (two) times daily with a meal.   Yes [provider]  FLUoxetine (PROZAC) 20 MG capsule Take 20 mg by mouth daily.   Yes [provider]  gabapentin (NEURONTIN) 300 MG capsule Take 300 mg by mouth 2 (two) times daily.    Yes [provider]  insulin glargine (LANTUS) 100 UNIT/ML injection Inject 0.6 mLs (60 Units total) into the skin at bedtime. Patient taking differently: Inject 60 Units into the skin at bedtime. Take 60-80 units at bedtime depending on what he has eaten. 01/01/18  Yes Gladstone [name], MD  insulin lispro (HUMALOG) 100 UNIT/ML KiwkPen Inject 0-18 Units into the skin 3 (three) times daily with meals. Per sliding scale   Yes [provider]  lansoprazole (PREVACID) 15 MG capsule Take  30 mg by mouth 2 (two) times daily.   Yes [provider]  loratadine (CLARITIN) 10 MG tablet Take 10 mg by mouth daily.   Yes [provider]  Magnesium Oxide 400 (240 Mg) MG TABS Take 1 tablet (400 mg total) by mouth 2 (two) times daily. 05/30/18  Yes Hackney, Otila Kluver A, FNP  Multiple Vitamins-Minerals (CENTRUM SILVER PO) Take 1 tablet by mouth every morning.   Yes [provider]  Multiple Vitamins-Minerals (PRESERVISION AREDS 2) CAPS Take 1 capsule by mouth 2 (two) times daily. For macular degeneration- eye vitamins   Yes [provider]  nitroGLYCERIN (NITROSTAT) 0.4 MG SL tablet Place 0.4 mg under the tongue every 5 (five) minutes as needed for chest pain.   Yes [provider]  ramipril (ALTACE) 2.5 MG capsule Take 2.5 mg by mouth daily.   Yes [provider]  ranolazine (RANEXA) 500 MG 12 hr tablet Take 1 tablet (500 mg total)  by mouth 2 (two) times daily. 05/30/18  Yes Hackney, Otila Kluver A, FNP  spironolactone (ALDACTONE) 25 MG tablet Take 25 mg by mouth daily.    Yes [provider]  tamsulosin (FLOMAX) 0.4 MG CAPS capsule Take 0.4 mg by mouth at bedtime.    Yes [provider]  tiotropium (SPIRIVA) 18 MCG inhalation capsule Place 1 capsule (18 mcg total) into inhaler and inhale daily. 06/08/18  Yes Wieting, Richard, MD  torsemide (DEMADEX) 20 MG tablet Take 1 tablet (20 mg total) by mouth 2 (two) times daily. 1 tab po twice a day.  For weight gain of 3 lbs in one day or five lbs in a week then increase to 2 tabs in am and one in afternoon 06/08/18  Yes Loletha Grayer, MD    Review of Systems  Constitutional: Positive for fatigue. Negative for appetite change.  HENT: Positive for voice change (hoarseness). Negative for congestion, postnasal drip and sore throat.   Eyes: Negative.   Respiratory: Positive for shortness of breath (worsening). Negative for cough and chest tightness.   Cardiovascular: Negative for chest pain,  palpitations and leg swelling.  Gastrointestinal: Negative for abdominal distention and abdominal pain.  Endocrine: Negative.   Genitourinary: Negative.   Musculoskeletal: Negative.   Skin: Negative.   Allergic/Immunologic: Negative.   Neurological: Positive for light-headedness (when changing positions too quickly). Negative for dizziness.  Hematological: Negative for adenopathy. Bruises/bleeds easily.  Psychiatric/Behavioral: Negative for dysphoric mood and sleep disturbance (sleeping well with CPAP and oxygen). The patient is not nervous/anxious.    Vitals:   06/29/18 1332  BP: (!) 101/48  Pulse: 90  Resp: 18  SpO2: 96%  Weight: 246 lb 2 oz (111.6 kg)   Wt Readings from Last 3 Encounters:  06/29/18 246 lb 2 oz (111.6 kg)  06/08/18 243 lb 3.2 oz (110.3 kg)  06/04/18 245 lb (111.1 kg)   Lab Results  Component Value Date   CREATININE 1.47 (H) 06/08/2018   CREATININE 1.49 (H) 06/07/2018   CREATININE 1.46 (H) 06/06/2018   Physical Exam  Constitutional: He is oriented to person, place, and time. He appears well-developed and well-nourished.  HENT:  Head: Normocephalic and atraumatic.  Neck: Normal range of motion. Neck supple. No JVD present.  Cardiovascular: Normal rate and regular rhythm.  Pulmonary/Chest: Effort normal. He has no wheezes. He has no rales.  Abdominal: He exhibits no distension. There is no tenderness.  Musculoskeletal: He exhibits no edema or tenderness.  Neurological: He is alert and oriented to person, place, and time.  Skin: Skin is warm and dry.  Psychiatric: He has a normal mood and affect. His behavior is normal. Thought content normal.  Nursing note and vitals reviewed.  Assessment & Plan:  1: Chronic heart failure with reduced ejection fraction-  - NYHA class III - euvolemic today - has been weighing daily and home weight chart was reviewed. Reminded to call for an overnight weight gain of 2 pounds or > or a weekly weight gain of >5 pounds -  weight stable from last time he was here - not adding salt but does eat fast food often. Eats at [employer] 7 days a week but most days he takes his own breakfast and goes to socialize. Discussed the importance of closely following a 2000mg  sodium diet and to limit fast food - due to patient feeling more short of breath, advised him that he could take an additional 20mg  torsemide today to see if that helps - they will make  appointment with PCP regarding his COPD; instructed to swish and spit after inhaler use - saw cardiology [name]) 06/13/18 - BNP from 06/06/18 was 520.0  2: HTN- - BP looks good today - saw PCP ([name]) 03/07/18 - BMP from 06/08/18 reviewed and showed sodium 138, potassium 4.0 and GFR 46   3: Obstructive sleep apnea-   - wearing CPAP nightly along with oxygen at 5L - reports sleeping well  4: Diabetes-  - fasting glucose at home this morning was 146 - A1c on 03/21/18 was 6.7% - saw endocrinologist ([name]) 03/28/18  Patient did not bring his medications nor a list. Each medication was verbally reviewed with the patient and he was encouraged to bring the bottles to every visit to confirm accuracy of list.   Return in 1 month or sooner for any questions/problems before then.

## 2018-07-18 DIAGNOSIS — I255 Ischemic cardiomyopathy: Secondary | ICD-10-CM | POA: Diagnosis not present

## 2018-07-18 DIAGNOSIS — I70219 Atherosclerosis of native arteries of extremities with intermittent claudication, unspecified extremity: Secondary | ICD-10-CM | POA: Diagnosis not present

## 2018-07-18 DIAGNOSIS — I25728 Atherosclerosis of autologous artery coronary artery bypass graft(s) with other forms of angina pectoris: Secondary | ICD-10-CM | POA: Diagnosis not present

## 2018-07-18 DIAGNOSIS — E782 Mixed hyperlipidemia: Secondary | ICD-10-CM | POA: Diagnosis not present

## 2018-07-18 DIAGNOSIS — R42 Dizziness and giddiness: Secondary | ICD-10-CM | POA: Diagnosis not present

## 2018-07-18 DIAGNOSIS — I10 Essential (primary) hypertension: Secondary | ICD-10-CM | POA: Diagnosis not present

## 2018-07-18 DIAGNOSIS — I5022 Chronic systolic (congestive) heart failure: Secondary | ICD-10-CM | POA: Diagnosis not present

## 2018-07-25 DIAGNOSIS — R42 Dizziness and giddiness: Secondary | ICD-10-CM | POA: Diagnosis not present

## 2018-07-27 ENCOUNTER — Ambulatory Visit: Payer: PPO | Attending: Family | Admitting: Family

## 2018-07-27 ENCOUNTER — Encounter: Payer: Self-pay | Admitting: Family

## 2018-07-27 VITALS — BP 111/70 | HR 85 | Resp 18 | Ht 68.0 in | Wt 244.2 lb

## 2018-07-27 DIAGNOSIS — E1143 Type 2 diabetes mellitus with diabetic autonomic (poly)neuropathy: Secondary | ICD-10-CM

## 2018-07-27 DIAGNOSIS — I255 Ischemic cardiomyopathy: Secondary | ICD-10-CM | POA: Diagnosis not present

## 2018-07-27 DIAGNOSIS — E7800 Pure hypercholesterolemia, unspecified: Secondary | ICD-10-CM | POA: Diagnosis not present

## 2018-07-27 DIAGNOSIS — Z833 Family history of diabetes mellitus: Secondary | ICD-10-CM | POA: Insufficient documentation

## 2018-07-27 DIAGNOSIS — I110 Hypertensive heart disease with heart failure: Secondary | ICD-10-CM | POA: Insufficient documentation

## 2018-07-27 DIAGNOSIS — I2510 Atherosclerotic heart disease of native coronary artery without angina pectoris: Secondary | ICD-10-CM | POA: Insufficient documentation

## 2018-07-27 DIAGNOSIS — Z888 Allergy status to other drugs, medicaments and biological substances status: Secondary | ICD-10-CM | POA: Diagnosis not present

## 2018-07-27 DIAGNOSIS — I252 Old myocardial infarction: Secondary | ICD-10-CM | POA: Insufficient documentation

## 2018-07-27 DIAGNOSIS — Z881 Allergy status to other antibiotic agents status: Secondary | ICD-10-CM | POA: Insufficient documentation

## 2018-07-27 DIAGNOSIS — I5022 Chronic systolic (congestive) heart failure: Secondary | ICD-10-CM | POA: Diagnosis not present

## 2018-07-27 DIAGNOSIS — I340 Nonrheumatic mitral (valve) insufficiency: Secondary | ICD-10-CM | POA: Diagnosis not present

## 2018-07-27 DIAGNOSIS — Z8249 Family history of ischemic heart disease and other diseases of the circulatory system: Secondary | ICD-10-CM | POA: Diagnosis not present

## 2018-07-27 DIAGNOSIS — Z955 Presence of coronary angioplasty implant and graft: Secondary | ICD-10-CM | POA: Diagnosis not present

## 2018-07-27 DIAGNOSIS — Z79899 Other long term (current) drug therapy: Secondary | ICD-10-CM | POA: Insufficient documentation

## 2018-07-27 DIAGNOSIS — Z951 Presence of aortocoronary bypass graft: Secondary | ICD-10-CM | POA: Insufficient documentation

## 2018-07-27 DIAGNOSIS — Z87891 Personal history of nicotine dependence: Secondary | ICD-10-CM | POA: Diagnosis not present

## 2018-07-27 DIAGNOSIS — E119 Type 2 diabetes mellitus without complications: Secondary | ICD-10-CM | POA: Diagnosis not present

## 2018-07-27 DIAGNOSIS — Z7902 Long term (current) use of antithrombotics/antiplatelets: Secondary | ICD-10-CM | POA: Diagnosis not present

## 2018-07-27 DIAGNOSIS — Z794 Long term (current) use of insulin: Secondary | ICD-10-CM | POA: Insufficient documentation

## 2018-07-27 DIAGNOSIS — Z9889 Other specified postprocedural states: Secondary | ICD-10-CM | POA: Diagnosis not present

## 2018-07-27 DIAGNOSIS — I10 Essential (primary) hypertension: Secondary | ICD-10-CM

## 2018-07-27 DIAGNOSIS — E785 Hyperlipidemia, unspecified: Secondary | ICD-10-CM | POA: Insufficient documentation

## 2018-07-27 DIAGNOSIS — J449 Chronic obstructive pulmonary disease, unspecified: Secondary | ICD-10-CM | POA: Diagnosis not present

## 2018-07-27 DIAGNOSIS — Z8546 Personal history of malignant neoplasm of prostate: Secondary | ICD-10-CM | POA: Diagnosis not present

## 2018-07-27 DIAGNOSIS — K219 Gastro-esophageal reflux disease without esophagitis: Secondary | ICD-10-CM | POA: Diagnosis not present

## 2018-07-27 DIAGNOSIS — D649 Anemia, unspecified: Secondary | ICD-10-CM | POA: Diagnosis not present

## 2018-07-27 DIAGNOSIS — G4733 Obstructive sleep apnea (adult) (pediatric): Secondary | ICD-10-CM | POA: Insufficient documentation

## 2018-07-27 NOTE — Progress Notes (Signed)
Patient ID: [name], male    DOB: 1946-04-10, 72 y.o.   MRN: [id number]  HPI  [name] is a 72 y/o male with a history of prostate cancer, DM, hyperlipidemia, HTN, anemia, COPD, GERD, MI, previous tobacco use and chronic heart failure.   Echo report from 02/18/18 reviewed and showed an EF of 20-25% along with trivial AR and moderate Daniel. Echo report from 12/05/17 reviewed and showed an EF of 20-25% along with mild Daniel and moderate Daniel. Cardiac catheterization done 02/02/16 showed severed 3-vessel CAD with patent grafts. Continue medication management along with dual antiplatelet therapy.   Admitted 06/06/18 due to COPD exacerbation. Given IV solu-medrol and nebulizer treatments and then switched or oral prednisone. Spiriva was added to regimen. HF was stable and he was discharged after 2 days. Was in the ED 06/04/18 for shortness of breath where he was treated and released. Admitted 02/18/18 due to HF exacerbation. Cardiology consult obtained. Initially given IV lasix and then transitioned to oral diuretics. Needed oxygen and bipap initially as well. Supplemented with magnesium and discharged the following day per patient's request. Was in the ED 02/12/18 due to HF exacerbation. He had missed his most recent HF Clinic appointment. Given one dose of IV lasix and released.   He presents today for a follow-up visit with a chief complaint of moderate shortness of breath upon minimal exertion. He describes this as chronic in nature having been present for several years. He has associated fatigue, light-headedness and easy bruising along with this. He denies any difficulty sleeping, abdominal distention, palpitations, pedal edema, chest pain, cough or weight gain.   Past Medical History:  Diagnosis Date  . Anemia   . Cancer ([place]) 12/2013   prostate  . Cardiogenic pulmonary edema ([place]) 12/19/2014  . Cardiomyopathy, ischemic   . CHF (congestive heart failure) ([place])   . COPD (chronic obstructive  pulmonary disease) ([place])   . Diabetes mellitus without complication ([place])   . GERD (gastroesophageal reflux disease)   . Hypercholesteremia   . Hypertension   . Myocardial infarction ([place]) [id number]  . Shortness of breath dyspnea    Past Surgical History:  Procedure Laterality Date  . CARDIAC CATHETERIZATION N/A 02/02/2016   Procedure: Left Heart Cath and Coronary Angiography;  Surgeon: [name], MD;  Location: [place] [facility];  Service: Cardiovascular;  Laterality: N/A;  . CORONARY ANGIOPLASTY WITH STENT PLACEMENT    . CORONARY ARTERY BYPASS GRAFT  11/24/2010  . IMPLANTABLE CARDIOVERTER DEFIBRILLATOR (ICD) GENERATOR CHANGE Left 12/12/2015   Procedure: DUAL LEAD PLACEMENT CARDIAC DIFIBRILLATOR;  Surgeon: [name], MD;  Location: [facility];  Service: Cardiovascular;  Laterality: Left;  . TONSILLECTOMY     Family History  Problem Relation Age of Onset  . CAD Mother   . Cancer Mother   . Diabetes Mother   . Alzheimer's disease Father   . Cancer Father   . Heart disease Father    Social History   Tobacco Use  . Smoking status: Former Smoker    Packs/day: 2.00    Years: 30.00    Pack years: 60.00    Last attempt to quit: 12/03/1996    Years since quitting: 21.6  . Smokeless tobacco: Never Used  Substance Use Topics  . Alcohol use: No    Alcohol/week: 0.0 standard drinks   Allergies  Allergen Reactions  . Benadryl [Diphenhydramine] Other (See Comments)    " Hyperactivity"  . Doxycycline Swelling    Pt went into  pulmonary edema.  . Lopid [Gemfibrozil] Swelling    "I gain 1 pound a day for 30 days."   Prior to Admission medications   Medication Sig Start Date End Date Taking? Authorizing Provider  acetaminophen (TYLENOL) 500 MG tablet Take 1,000 mg by mouth daily as needed for moderate pain.   Yes [provider]  albuterol (PROVENTIL HFA;VENTOLIN HFA) 108 (90 Base) MCG/ACT inhaler Inhale 2 puffs into the lungs every 6 (six) hours as needed  for wheezing or shortness of breath. 06/04/18  Yes [name], [name], MD  allopurinol (ZYLOPRIM) 100 MG tablet Take 100 mg by mouth daily.   Yes [provider]  atorvastatin (LIPITOR) 80 MG tablet Take 1 tablet (80 mg total) by mouth every evening. 04/21/17  Yes Theodoro [name], MD  budesonide-formoterol (SYMBICORT) 80-4.5 MCG/ACT inhaler Inhale 2 puffs into the lungs 2 (two) times daily.   Yes [provider]  calcium carbonate (TUMS - DOSED IN MG ELEMENTAL CALCIUM) 500 MG chewable tablet Chew 1 tablet by mouth as needed for indigestion or heartburn.   Yes [provider]  carvedilol (COREG) 3.125 MG tablet Take 1 tablet (3.125 mg total) by mouth 2 (two) times daily with a meal. 01/01/18  Yes Gladstone [name], MD  clopidogrel (PLAVIX) 75 MG tablet Take 75 mg by mouth every morning.   Yes [provider]  co-enzyme Q-10 30 MG capsule Take 30 mg by mouth daily.   Yes [provider]  ferrous sulfate 325 (65 FE) MG tablet Take 325 mg by mouth 2 (two) times daily with a meal.   Yes [provider]  FLUoxetine (PROZAC) 20 MG capsule Take 20 mg by mouth daily.   Yes [provider]  gabapentin (NEURONTIN) 300 MG capsule Take 300 mg by mouth 2 (two) times daily.    Yes [provider]  insulin glargine (LANTUS) 100 UNIT/ML injection Inject 0.6 mLs (60 Units total) into the skin at bedtime. Patient taking differently: Inject 60 Units into the skin at bedtime. Take 60-80 units at bedtime depending on what he has eaten. 01/01/18  Yes Gladstone [name], MD  insulin lispro (HUMALOG) 100 UNIT/ML KiwkPen Inject 0-18 Units into the skin 3 (three) times daily with meals. Per sliding scale   Yes [provider]  lansoprazole (PREVACID) 15 MG capsule Take 30 mg by mouth 2 (two) times daily.   Yes [provider]  loratadine (CLARITIN) 10 MG tablet Take 10 mg by mouth daily.   Yes [provider]  Magnesium Oxide 400  (240 Mg) MG TABS Take 1 tablet (400 mg total) by mouth 2 (two) times daily. 05/30/18  Yes Hackney, Otila Kluver A, FNP  Multiple Vitamins-Minerals (CENTRUM SILVER PO) Take 1 tablet by mouth every morning.   Yes [provider]  Multiple Vitamins-Minerals (PRESERVISION AREDS 2) CAPS Take 1 capsule by mouth 2 (two) times daily. For macular degeneration- eye vitamins   Yes [provider]  nitroGLYCERIN (NITROSTAT) 0.4 MG SL tablet Place 0.4 mg under the tongue every 5 (five) minutes as needed for chest pain.   Yes [provider]  ramipril (ALTACE) 2.5 MG capsule Take 2.5 mg by mouth daily.   Yes [provider]  ranolazine (RANEXA) 500 MG 12 hr tablet Take 1 tablet (500 mg total) by mouth 2 (two) times daily. 05/30/18  Yes Hackney, Otila Kluver A, FNP  spironolactone (ALDACTONE) 25 MG tablet Take 25 mg by mouth daily.    Yes [provider]  tamsulosin (FLOMAX) 0.4  MG CAPS capsule Take 0.4 mg by mouth at bedtime.    Yes [provider]  tiotropium (SPIRIVA) 18 MCG inhalation capsule Place 1 capsule (18 mcg total) into inhaler and inhale daily. 06/08/18  Yes Wieting, Richard, MD  torsemide (DEMADEX) 20 MG tablet Take 1 tablet (20 mg total) by mouth 2 (two) times daily. 1 tab po twice a day.  For weight gain of 3 lbs in one day or five lbs in a week then increase to 2 tabs in am and one in afternoon 06/08/18  Yes Loletha Grayer, MD   Review of Systems  Constitutional: Positive for fatigue. Negative for appetite change.  HENT: Negative for congestion, postnasal drip, sore throat and voice change.   Eyes: Negative.   Respiratory: Positive for shortness of breath. Negative for cough and chest tightness.   Cardiovascular: Negative for chest pain, palpitations and leg swelling.  Gastrointestinal: Negative for abdominal distention and abdominal pain.  Endocrine: Negative.   Genitourinary: Negative.   Musculoskeletal: Positive for arthralgias (leg pain when walking long  distances).  Skin: Negative.   Allergic/Immunologic: Negative.   Neurological: Positive for light-headedness (when changing positions too quickly). Negative for dizziness.  Hematological: Negative for adenopathy. Bruises/bleeds easily.  Psychiatric/Behavioral: Negative for dysphoric mood and sleep disturbance (sleeping well with CPAP and oxygen). The patient is not nervous/anxious.    Vitals:   07/27/18 1319  BP: 111/70  Pulse: 85  Resp: 18  SpO2: 99%  Weight: 244 lb 4 oz (110.8 kg)  Height: 5\' 8"  (1.727 m)   Wt Readings from Last 3 Encounters:  07/27/18 244 lb 4 oz (110.8 kg)  06/29/18 246 lb 2 oz (111.6 kg)  06/08/18 243 lb 3.2 oz (110.3 kg)   Lab Results  Component Value Date   CREATININE 1.47 (H) 06/08/2018   CREATININE 1.49 (H) 06/07/2018   CREATININE 1.46 (H) 06/06/2018   Physical Exam  Constitutional: He is oriented to person, place, and time. He appears well-developed and well-nourished.  HENT:  Head: Normocephalic and atraumatic.  Neck: Normal range of motion. Neck supple. No JVD present.  Cardiovascular: Normal rate and regular rhythm.  Pulmonary/Chest: Effort normal. He has no wheezes. He has no rales.  Abdominal: He exhibits no distension. There is no tenderness.  Musculoskeletal: He exhibits no edema or tenderness.  Neurological: He is alert and oriented to person, place, and time.  Skin: Skin is warm and dry.  Psychiatric: He has a normal mood and affect. His behavior is normal. Thought content normal.  Nursing note and vitals reviewed.  Assessment & Plan:  1: Chronic heart failure with reduced ejection fraction-  - NYHA class III - euvolemic today - has been weighing daily and home weight chart was reviewed. Reminded to call for an overnight weight gain of 2 pounds or > or a weekly weight gain of >5 pounds - weight down 2 pounds from his last visit 1 month ago - not adding salt. Eats at [employer] 7 days a week but most days he takes his own breakfast and  goes to socialize. Discussed the importance of closely following a 2000mg  sodium diet and to limit fast food - saw cardiology Nehemiah Massed) 07/18/18 - BNP from 06/06/18 was 520.0  2: HTN- - BP looks good today - saw PCP ([place]) 06/29/18 - BMP from 06/08/18 reviewed and showed sodium 138, potassium 4.0 and GFR 46   3: Obstructive sleep apnea-   - wearing CPAP nightly along with oxygen at 5L - reports sleeping well  4: Diabetes-  - A1c on 03/21/18 was 6.7% - saw endocrinologist ([place]) 03/28/18  Patient did not bring his medications nor a list. Each medication was verbally reviewed with the patient and he was encouraged to bring the bottles to every visit to confirm accuracy of list.   Return in 1 month or sooner for any questions/problems before then.

## 2018-07-27 NOTE — Patient Instructions (Signed)
Continue weighing daily and call for an overnight weight gain of > 2 pounds or a weekly weight gain of >5 pounds. 

## 2018-07-28 ENCOUNTER — Encounter: Payer: Self-pay | Admitting: Family

## 2018-08-03 DIAGNOSIS — I70219 Atherosclerosis of native arteries of extremities with intermittent claudication, unspecified extremity: Secondary | ICD-10-CM | POA: Diagnosis not present

## 2018-08-04 DIAGNOSIS — E119 Type 2 diabetes mellitus without complications: Secondary | ICD-10-CM | POA: Diagnosis not present

## 2018-08-28 ENCOUNTER — Ambulatory Visit: Payer: PPO | Attending: Family | Admitting: Family

## 2018-08-28 ENCOUNTER — Encounter: Payer: Self-pay | Admitting: Family

## 2018-08-28 VITALS — BP 93/47 | HR 76 | Resp 18 | Ht 67.0 in | Wt 246.1 lb

## 2018-08-28 DIAGNOSIS — Z79899 Other long term (current) drug therapy: Secondary | ICD-10-CM | POA: Diagnosis not present

## 2018-08-28 DIAGNOSIS — E1143 Type 2 diabetes mellitus with diabetic autonomic (poly)neuropathy: Secondary | ICD-10-CM

## 2018-08-28 DIAGNOSIS — Z7951 Long term (current) use of inhaled steroids: Secondary | ICD-10-CM | POA: Diagnosis not present

## 2018-08-28 DIAGNOSIS — Z794 Long term (current) use of insulin: Secondary | ICD-10-CM

## 2018-08-28 DIAGNOSIS — K219 Gastro-esophageal reflux disease without esophagitis: Secondary | ICD-10-CM | POA: Insufficient documentation

## 2018-08-28 DIAGNOSIS — I110 Hypertensive heart disease with heart failure: Secondary | ICD-10-CM | POA: Diagnosis not present

## 2018-08-28 DIAGNOSIS — E119 Type 2 diabetes mellitus without complications: Secondary | ICD-10-CM | POA: Insufficient documentation

## 2018-08-28 DIAGNOSIS — I252 Old myocardial infarction: Secondary | ICD-10-CM | POA: Diagnosis not present

## 2018-08-28 DIAGNOSIS — Z8249 Family history of ischemic heart disease and other diseases of the circulatory system: Secondary | ICD-10-CM | POA: Diagnosis not present

## 2018-08-28 DIAGNOSIS — Z888 Allergy status to other drugs, medicaments and biological substances status: Secondary | ICD-10-CM | POA: Insufficient documentation

## 2018-08-28 DIAGNOSIS — Z87891 Personal history of nicotine dependence: Secondary | ICD-10-CM | POA: Insufficient documentation

## 2018-08-28 DIAGNOSIS — Z955 Presence of coronary angioplasty implant and graft: Secondary | ICD-10-CM | POA: Diagnosis not present

## 2018-08-28 DIAGNOSIS — Z8546 Personal history of malignant neoplasm of prostate: Secondary | ICD-10-CM | POA: Diagnosis not present

## 2018-08-28 DIAGNOSIS — E7800 Pure hypercholesterolemia, unspecified: Secondary | ICD-10-CM | POA: Diagnosis not present

## 2018-08-28 DIAGNOSIS — I5022 Chronic systolic (congestive) heart failure: Secondary | ICD-10-CM

## 2018-08-28 DIAGNOSIS — I10 Essential (primary) hypertension: Secondary | ICD-10-CM

## 2018-08-28 NOTE — Progress Notes (Signed)
Patient ID: [name], male    DOB: 07/05/1946, 72 y.o.   MRN: [id number]  HPI  [name] is a 72 y/o male with a history of prostate cancer, DM, hyperlipidemia, HTN, anemia, COPD, GERD, MI, previous tobacco use and chronic heart failure.   Echo report from 02/18/18 reviewed and showed an EF of 20-25% along with trivial AR and moderate Daniel. Echo report from 12/05/17 reviewed and showed an EF of 20-25% along with mild Daniel and moderate Daniel. Cardiac catheterization done 02/02/16 showed severed 3-vessel CAD with patent grafts. Continue medication management along with dual antiplatelet therapy.   Admitted 06/06/18 due to COPD exacerbation. Given IV solu-medrol and nebulizer treatments and then switched or oral prednisone. Spiriva was added to regimen. HF was stable and he was discharged after 2 days. Was in the ED 06/04/18 for shortness of breath where he was treated and released. Admitted 02/18/18 due to HF exacerbation. Cardiology consult obtained. Initially given IV lasix and then transitioned to oral diuretics. Needed oxygen and bipap initially as well. Supplemented with magnesium and discharged the following day per patient's request. Was in the ED 02/12/18 due to HF exacerbation. He had missed his most recent HF Clinic appointment. Given one dose of IV lasix and released.   He presents today for a follow-up visit with a chief complaint of moderate shortness of breath upon minimal exertion. He describes this as chronic in nature having been present for several years. He has associated fatigue and easy bruising along with this. He denies any difficulty sleeping, abdominal distention, palpitations, pedal edema, chest pain, cough, dizziness or weight gain. He has not received the flu vaccine yet.   Past Medical History:  Diagnosis Date  . Anemia   . Cancer ([place]) 12/2013   prostate  . Cardiogenic pulmonary edema ([place]) 12/19/2014  . Cardiomyopathy, ischemic   . CHF (congestive heart failure) ([place])   .  COPD (chronic obstructive pulmonary disease) ([place])   . Diabetes mellitus without complication ([place])   . GERD (gastroesophageal reflux disease)   . Hypercholesteremia   . Hypertension   . Myocardial infarction ([place]) [id number]  . Shortness of breath dyspnea    Past Surgical History:  Procedure Laterality Date  . CARDIAC CATHETERIZATION N/A 02/02/2016   Procedure: Left Heart Cath and Coronary Angiography;  Surgeon: [name], MD;  Location: [place] [facility];  Service: Cardiovascular;  Laterality: N/A;  . CORONARY ANGIOPLASTY WITH STENT PLACEMENT    . CORONARY ARTERY BYPASS GRAFT  11/24/2010  . IMPLANTABLE CARDIOVERTER DEFIBRILLATOR (ICD) GENERATOR CHANGE Left 12/12/2015   Procedure: DUAL LEAD PLACEMENT CARDIAC DIFIBRILLATOR;  Surgeon: [name], MD;  Location: [facility];  Service: Cardiovascular;  Laterality: Left;  . TONSILLECTOMY     Family History  Problem Relation Age of Onset  . CAD Mother   . Cancer Mother   . Diabetes Mother   . Alzheimer's disease Father   . Cancer Father   . Heart disease Father    Social History   Tobacco Use  . Smoking status: Former Smoker    Packs/day: 2.00    Years: 30.00    Pack years: 60.00    Last attempt to quit: 12/03/1996    Years since quitting: 21.7  . Smokeless tobacco: Never Used  Substance Use Topics  . Alcohol use: No    Alcohol/week: 0.0 standard drinks   Allergies  Allergen Reactions  . Benadryl [Diphenhydramine] Other (See Comments)    " Hyperactivity"  .  Doxycycline Swelling    Pt went into pulmonary edema.  . Lopid [Gemfibrozil] Swelling    "I gain 1 pound a day for 30 days."   Prior to Admission medications   Medication Sig Start Date End Date Taking? Authorizing Provider  acetaminophen (TYLENOL) 500 MG tablet Take 1,000 mg by mouth daily as needed for moderate pain.   Yes [provider]  albuterol (PROVENTIL HFA;VENTOLIN HFA) 108 (90 Base) MCG/ACT inhaler Inhale 2 puffs into the lungs every  6 (six) hours as needed for wheezing or shortness of breath. 06/04/18  Yes [name], [name], MD  allopurinol (ZYLOPRIM) 100 MG tablet Take 100 mg by mouth daily.   Yes [provider]  atorvastatin (LIPITOR) 80 MG tablet Take 1 tablet (80 mg total) by mouth every evening. 04/21/17  Yes Theodoro [name], MD  budesonide-formoterol (SYMBICORT) 80-4.5 MCG/ACT inhaler Inhale 2 puffs into the lungs 2 (two) times daily.   Yes [provider]  calcium carbonate (TUMS - DOSED IN MG ELEMENTAL CALCIUM) 500 MG chewable tablet Chew 1 tablet by mouth as needed for indigestion or heartburn.   Yes [provider]  carvedilol (COREG) 3.125 MG tablet Take 1 tablet (3.125 mg total) by mouth 2 (two) times daily with a meal. 01/01/18  Yes Gladstone [name], MD  clopidogrel (PLAVIX) 75 MG tablet Take 75 mg by mouth every morning.   Yes [provider]  co-enzyme Q-10 30 MG capsule Take 30 mg by mouth daily.   Yes [provider]  ferrous sulfate 325 (65 FE) MG tablet Take 325 mg by mouth 2 (two) times daily with a meal.   Yes [provider]  FLUoxetine (PROZAC) 20 MG capsule Take 20 mg by mouth daily.   Yes [provider]  gabapentin (NEURONTIN) 300 MG capsule Take 300 mg by mouth 2 (two) times daily.    Yes [provider]  insulin glargine (LANTUS) 100 UNIT/ML injection Inject 0.6 mLs (60 Units total) into the skin at bedtime. Patient taking differently: Inject 60 Units into the skin at bedtime. Take 60-80 units at bedtime depending on what he has eaten. 01/01/18  Yes Gladstone [name], MD  insulin lispro (HUMALOG) 100 UNIT/ML KiwkPen Inject 0-18 Units into the skin 3 (three) times daily with meals. Per sliding scale   Yes [provider]  lansoprazole (PREVACID) 15 MG capsule Take 30 mg by mouth 2 (two) times daily.   Yes [provider]  loratadine (CLARITIN) 10 MG tablet Take 10 mg by mouth daily.   Yes [provider]   Magnesium Oxide 400 (240 Mg) MG TABS Take 1 tablet (400 mg total) by mouth 2 (two) times daily. 05/30/18  Yes Jannessa Ogden, Otila Kluver A, FNP  Multiple Vitamins-Minerals (CENTRUM SILVER PO) Take 1 tablet by mouth every morning.   Yes [provider]  Multiple Vitamins-Minerals (PRESERVISION AREDS 2) CAPS Take 1 capsule by mouth 2 (two) times daily. For macular degeneration- eye vitamins   Yes [provider]  nitroGLYCERIN (NITROSTAT) 0.4 MG SL tablet Place 0.4 mg under the tongue every 5 (five) minutes as needed for chest pain.   Yes [provider]  ramipril (ALTACE) 2.5 MG capsule Take 2.5 mg by mouth daily.   Yes [provider]  ranolazine (RANEXA) 500 MG 12 hr tablet Take 1 tablet (500 mg total) by mouth 2 (two) times daily. 05/30/18  Yes Julianny Milstein, Otila Kluver A, FNP  spironolactone (ALDACTONE) 25 MG tablet Take 25 mg by mouth daily.  Yes [provider]  tamsulosin (FLOMAX) 0.4 MG CAPS capsule Take 0.4 mg by mouth at bedtime.    Yes [provider]  tiotropium (SPIRIVA) 18 MCG inhalation capsule Place 1 capsule (18 mcg total) into inhaler and inhale daily. 06/08/18  Yes Wieting, Richard, MD  torsemide (DEMADEX) 20 MG tablet Take 1 tablet (20 mg total) by mouth 2 (two) times daily. 1 tab po twice a day.  For weight gain of 3 lbs in one day or five lbs in a week then increase to 2 tabs in am and one in afternoon 06/08/18  Yes Loletha Grayer, MD    Review of Systems  Constitutional: Positive for fatigue. Negative for appetite change.  HENT: Negative for congestion, postnasal drip, sore throat and voice change.   Eyes: Negative.   Respiratory: Positive for shortness of breath. Negative for cough and chest tightness.   Cardiovascular: Negative for chest pain, palpitations and leg swelling.  Gastrointestinal: Negative for abdominal distention and abdominal pain.  Endocrine: Negative.   Genitourinary: Negative.   Musculoskeletal: Positive for arthralgias (leg  pain when walking long distances).  Skin: Negative.   Allergic/Immunologic: Negative.   Neurological: Negative for dizziness and light-headedness.  Hematological: Negative for adenopathy. Bruises/bleeds easily.  Psychiatric/Behavioral: Negative for dysphoric mood and sleep disturbance (sleeping well with CPAP and oxygen). The patient is not nervous/anxious.    Vitals:   08/28/18 1412  BP: (!) 93/47  Pulse: 76  Resp: 18  SpO2: 96%  Weight: 246 lb 2 oz (111.6 kg)  Height: 5\' 7"  (1.702 m)   Wt Readings from Last 3 Encounters:  08/28/18 246 lb 2 oz (111.6 kg)  07/27/18 244 lb 4 oz (110.8 kg)  06/29/18 246 lb 2 oz (111.6 kg)   Lab Results  Component Value Date   CREATININE 1.47 (H) 06/08/2018   CREATININE 1.49 (H) 06/07/2018   CREATININE 1.46 (H) 06/06/2018    Physical Exam  Constitutional: He is oriented to person, place, and time. He appears well-developed and well-nourished.  HENT:  Head: Normocephalic and atraumatic.  Neck: Normal range of motion. Neck supple. No JVD present.  Cardiovascular: Normal rate and regular rhythm.  Pulmonary/Chest: Effort normal. He has no wheezes. He has no rales.  Abdominal: He exhibits no distension. There is no tenderness.  Musculoskeletal: He exhibits no edema or tenderness.  Neurological: He is alert and oriented to person, place, and time.  Skin: Skin is warm and dry.  Psychiatric: He has a normal mood and affect. His behavior is normal. Thought content normal.  Nursing note and vitals reviewed.  Assessment & Plan:  1: Chronic heart failure with reduced ejection fraction-  - NYHA class III - euvolemic today - has been weighing daily and home weight chart was reviewed. Reminded to call for an overnight weight gain of 2 pounds or > or a weekly weight gain of >5 pounds - weight up ~ 2 pounds from his last visit 1 month ago - not adding salt. Eats at [employer] 7 days a week but most days he takes his own breakfast and goes to socialize.  Discussed the importance of closely following a 2000mg  sodium diet and to limit fast food - saw cardiology Nehemiah Massed) 07/18/18 - BNP from 06/06/18 was 520.0  2: HTN- - BP on the low side today but patient is without dizziness - saw PCP ([place]) 06/29/18 - BMP from 06/08/18 reviewed and showed sodium 138, potassium 4.0 and GFR 46   3:Diabetes-  - A1c on 03/21/18  was 6.7% - glucose at home today was 45 - saw endocrinologist ([place]) 03/28/18  Patient did not bring his medications nor a list. Each medication was verbally reviewed with the patient and he was encouraged to bring the bottles to every visit to confirm accuracy of list.   Return in 2 months or sooner for any questions/problems before then.

## 2018-08-28 NOTE — Patient Instructions (Signed)
Continue weighing daily and call for an overnight weight gain of > 2 pounds or a weekly weight gain of >5 pounds. 

## 2018-08-29 ENCOUNTER — Encounter: Payer: Self-pay | Admitting: Family

## 2018-08-30 ENCOUNTER — Ambulatory Visit: Payer: PPO | Admitting: Family

## 2018-09-02 ENCOUNTER — Other Ambulatory Visit: Payer: Self-pay

## 2018-09-02 ENCOUNTER — Encounter: Payer: Self-pay | Admitting: Emergency Medicine

## 2018-09-02 ENCOUNTER — Emergency Department: Payer: PPO

## 2018-09-02 ENCOUNTER — Inpatient Hospital Stay
Admission: EM | Admit: 2018-09-02 | Discharge: 2018-09-05 | DRG: 246 | Disposition: A | Discharge status: Home / Self Care | Payer: PPO | Attending: Internal Medicine | Admitting: Internal Medicine

## 2018-09-02 DIAGNOSIS — I255 Ischemic cardiomyopathy: Secondary | ICD-10-CM | POA: Diagnosis present

## 2018-09-02 DIAGNOSIS — N183 Chronic kidney disease, stage 3 (moderate): Secondary | ICD-10-CM | POA: Diagnosis present

## 2018-09-02 DIAGNOSIS — Z951 Presence of aortocoronary bypass graft: Secondary | ICD-10-CM | POA: Diagnosis not present

## 2018-09-02 DIAGNOSIS — G4733 Obstructive sleep apnea (adult) (pediatric): Secondary | ICD-10-CM | POA: Diagnosis present

## 2018-09-02 DIAGNOSIS — Y832 Surgical operation with anastomosis, bypass or graft as the cause of abnormal reaction of the patient, or of later complication, without mention of misadventure at the time of the procedure: Secondary | ICD-10-CM | POA: Diagnosis present

## 2018-09-02 DIAGNOSIS — Z881 Allergy status to other antibiotic agents status: Secondary | ICD-10-CM

## 2018-09-02 DIAGNOSIS — I420 Dilated cardiomyopathy: Secondary | ICD-10-CM | POA: Diagnosis present

## 2018-09-02 DIAGNOSIS — I248 Other forms of acute ischemic heart disease: Secondary | ICD-10-CM | POA: Diagnosis present

## 2018-09-02 DIAGNOSIS — Z9989 Dependence on other enabling machines and devices: Secondary | ICD-10-CM

## 2018-09-02 DIAGNOSIS — R0602 Shortness of breath: Secondary | ICD-10-CM | POA: Diagnosis not present

## 2018-09-02 DIAGNOSIS — I252 Old myocardial infarction: Secondary | ICD-10-CM | POA: Diagnosis not present

## 2018-09-02 DIAGNOSIS — I25710 Atherosclerosis of autologous vein coronary artery bypass graft(s) with unstable angina pectoris: Secondary | ICD-10-CM | POA: Diagnosis not present

## 2018-09-02 DIAGNOSIS — Z87891 Personal history of nicotine dependence: Secondary | ICD-10-CM | POA: Diagnosis not present

## 2018-09-02 DIAGNOSIS — Z955 Presence of coronary angioplasty implant and graft: Secondary | ICD-10-CM | POA: Diagnosis not present

## 2018-09-02 DIAGNOSIS — J441 Chronic obstructive pulmonary disease with (acute) exacerbation: Secondary | ICD-10-CM | POA: Diagnosis not present

## 2018-09-02 DIAGNOSIS — I130 Hypertensive heart and chronic kidney disease with heart failure and stage 1 through stage 4 chronic kidney disease, or unspecified chronic kidney disease: Secondary | ICD-10-CM | POA: Diagnosis not present

## 2018-09-02 DIAGNOSIS — J9621 Acute and chronic respiratory failure with hypoxia: Secondary | ICD-10-CM | POA: Diagnosis not present

## 2018-09-02 DIAGNOSIS — Z888 Allergy status to other drugs, medicaments and biological substances status: Secondary | ICD-10-CM

## 2018-09-02 DIAGNOSIS — T82855A Stenosis of coronary artery stent, initial encounter: Principal | ICD-10-CM | POA: Diagnosis present

## 2018-09-02 DIAGNOSIS — E1122 Type 2 diabetes mellitus with diabetic chronic kidney disease: Secondary | ICD-10-CM | POA: Diagnosis not present

## 2018-09-02 DIAGNOSIS — Z8546 Personal history of malignant neoplasm of prostate: Secondary | ICD-10-CM | POA: Diagnosis not present

## 2018-09-02 DIAGNOSIS — J9600 Acute respiratory failure, unspecified whether with hypoxia or hypercapnia: Secondary | ICD-10-CM | POA: Diagnosis present

## 2018-09-02 DIAGNOSIS — J810 Acute pulmonary edema: Secondary | ICD-10-CM | POA: Diagnosis not present

## 2018-09-02 DIAGNOSIS — I2510 Atherosclerotic heart disease of native coronary artery without angina pectoris: Secondary | ICD-10-CM | POA: Diagnosis not present

## 2018-09-02 DIAGNOSIS — Z7902 Long term (current) use of antithrombotics/antiplatelets: Secondary | ICD-10-CM

## 2018-09-02 DIAGNOSIS — I5023 Acute on chronic systolic (congestive) heart failure: Secondary | ICD-10-CM | POA: Diagnosis present

## 2018-09-02 DIAGNOSIS — Z794 Long term (current) use of insulin: Secondary | ICD-10-CM

## 2018-09-02 DIAGNOSIS — Z9981 Dependence on supplemental oxygen: Secondary | ICD-10-CM

## 2018-09-02 DIAGNOSIS — R748 Abnormal levels of other serum enzymes: Secondary | ICD-10-CM | POA: Diagnosis not present

## 2018-09-02 DIAGNOSIS — Z95810 Presence of automatic (implantable) cardiac defibrillator: Secondary | ICD-10-CM | POA: Diagnosis not present

## 2018-09-02 DIAGNOSIS — Z7951 Long term (current) use of inhaled steroids: Secondary | ICD-10-CM

## 2018-09-02 DIAGNOSIS — I25110 Atherosclerotic heart disease of native coronary artery with unstable angina pectoris: Secondary | ICD-10-CM | POA: Diagnosis present

## 2018-09-02 DIAGNOSIS — E7800 Pure hypercholesterolemia, unspecified: Secondary | ICD-10-CM | POA: Diagnosis not present

## 2018-09-02 DIAGNOSIS — R079 Chest pain, unspecified: Secondary | ICD-10-CM | POA: Diagnosis not present

## 2018-09-02 DIAGNOSIS — I5022 Chronic systolic (congestive) heart failure: Secondary | ICD-10-CM | POA: Diagnosis not present

## 2018-09-02 DIAGNOSIS — Z79899 Other long term (current) drug therapy: Secondary | ICD-10-CM

## 2018-09-02 DIAGNOSIS — I200 Unstable angina: Secondary | ICD-10-CM

## 2018-09-02 DIAGNOSIS — K219 Gastro-esophageal reflux disease without esophagitis: Secondary | ICD-10-CM | POA: Diagnosis present

## 2018-09-02 DIAGNOSIS — I25790 Atherosclerosis of other coronary artery bypass graft(s) with unstable angina pectoris: Secondary | ICD-10-CM | POA: Diagnosis not present

## 2018-09-02 LAB — CBC
HCT: 31.3 % — ABNORMAL LOW (ref 40.0–52.0)
Hemoglobin: 10.9 g/dL — ABNORMAL LOW (ref 13.0–18.0)
MCH: 31.8 pg (ref 26.0–34.0)
MCHC: 34.8 g/dL (ref 32.0–36.0)
MCV: 91.2 fL (ref 80.0–100.0)
PLATELETS: 205 10*3/uL (ref 150–440)
RBC: 3.43 MIL/uL — ABNORMAL LOW (ref 4.40–5.90)
RDW: 14.8 % — AB (ref 11.5–14.5)
WBC: 9.1 10*3/uL (ref 3.8–10.6)

## 2018-09-02 LAB — TROPONIN I: Troponin I: 0.06 ng/mL (ref <0.03)

## 2018-09-02 LAB — BASIC METABOLIC PANEL
Anion gap: 12 (ref 5–15)
BUN: 17 mg/dL (ref 8–23)
CHLORIDE: 102 mmol/L (ref 98–111)
CO2: 27 mmol/L (ref 22–32)
CREATININE: 1.31 mg/dL — AB (ref 0.61–1.24)
Calcium: 8.7 mg/dL — ABNORMAL LOW (ref 8.9–10.3)
GFR calc Af Amer: >60 mL/min (ref >60)
GFR calc non Af Amer: 52 mL/min — ABNORMAL LOW (ref >60)
Glucose, Bld: 105 mg/dL — ABNORMAL HIGH (ref 70–99)
Potassium: 3.7 mmol/L (ref 3.5–5.1)
SODIUM: 141 mmol/L (ref 135–145)

## 2018-09-02 LAB — BRAIN NATRIURETIC PEPTIDE: B Natriuretic Peptide: 381 pg/mL — ABNORMAL HIGH (ref 0.0–100.0)

## 2018-09-02 MED ORDER — FUROSEMIDE 10 MG/ML IJ SOLN
40.0000 mg | Freq: Once | INTRAMUSCULAR | Status: AC
  Administered 2018-09-02: 40 mg via INTRAVENOUS
  Filled 2018-09-02: qty 4

## 2018-09-02 NOTE — ED Triage Notes (Signed)
Pt arrives POV to triage with c/o chest pain that is radiating across the mid sternum. Pt has hx of flash pulmonary edema. Pt is experiencing SOB with and w/o excursion at this time and has diminished lung sounds in the lower lobes.

## 2018-09-02 NOTE — H&P (Addendum)
[facility] [place] NAME: [name]    MR#:  [id number]  DATE OF BIRTH:  October 13, 1946  DATE OF ADMISSION:  09/02/2018  PRIMARY CARE PHYSICIAN: [name], MD   REQUESTING/REFERRING PHYSICIAN:   CHIEF COMPLAINT:   Chief Complaint  Patient presents with  . Chest Pain    HISTORY OF PRESENT ILLNESS: [name]  is a 72 y.o. male with a known history of DM2, CHF, COPD, on home oxygen at night, OSA, on CPAP.  Most recent 2D echo, from March 2019 shows ejection fraction at 20-25%. Patient presented to emergency room for shortness of breath and intermittent chest pain, going on for the past 3 days, gradually getting worse. Chest pain is described as intermittent mild to moderate substernal pressure, without radiation.  His symptoms are worse with exertion.  Shortness of breath improves when sitting up at the edge of the bed. Blood test done emergency room are notable for creatinine level of 1.31, troponin level is 0.06, hemoglobin level 10.9.  BNP is 381. Oxygen saturation in the emergency room, during my examination is 96% on 2 L oxygen per nasal cannula. Patient is currently chest pain-free, after receiving nitroglycerin in the emergency room. EKG shows atrial sensed ventricular paced complexes. Chest x-ray shows mild congestive heart failure and small bilateral pleural effusions. Patient is admitted for further evaluation and treatment.  PAST MEDICAL HISTORY:   Past Medical History:  Diagnosis Date  . Anemia   . Cancer ([place]) 12/2013   prostate  . Cardiogenic pulmonary edema ([place]) 12/19/2014  . Cardiomyopathy, ischemic   . CHF (congestive heart failure) ([place])   . COPD (chronic obstructive pulmonary disease) ([place])   . Diabetes mellitus without complication ([place])   . GERD (gastroesophageal reflux disease)   . Hypercholesteremia   . Hypertension   . Myocardial infarction ([place]) [id number]  . Shortness of breath  dyspnea     PAST SURGICAL HISTORY:  Past Surgical History:  Procedure Laterality Date  . CARDIAC CATHETERIZATION N/A 02/02/2016   Procedure: Left Heart Cath and Coronary Angiography;  Surgeon: [name], MD;  Location: [facility];  Service: Cardiovascular;  Laterality: N/A;  . CORONARY ANGIOPLASTY WITH STENT PLACEMENT    . CORONARY ARTERY BYPASS GRAFT  11/24/2010  . IMPLANTABLE CARDIOVERTER DEFIBRILLATOR (ICD) GENERATOR CHANGE Left 12/12/2015   Procedure: DUAL LEAD PLACEMENT CARDIAC DIFIBRILLATOR;  Surgeon: [name], MD;  Location: [facility];  Service: Cardiovascular;  Laterality: Left;  . TONSILLECTOMY      SOCIAL HISTORY:  Social History   Tobacco Use  . Smoking status: Former Smoker    Packs/day: 2.00    Years: 30.00    Pack years: 60.00    Last attempt to quit: 12/03/1996    Years since quitting: 21.7  . Smokeless tobacco: Never Used  Substance Use Topics  . Alcohol use: No    Alcohol/week: 0.0 standard drinks    FAMILY HISTORY:  Family History  Problem Relation Age of Onset  . CAD Mother   . Cancer Mother   . Diabetes Mother   . Alzheimer's disease Father   . Cancer Father   . Heart disease Father     DRUG ALLERGIES:  Allergies  Allergen Reactions  . Tuberculin Tests Rash  . Benadryl [Diphenhydramine] Other (See Comments)    " Hyperactivity"  . Doxycycline Swelling    Pt went into pulmonary edema.  . Lopid [Gemfibrozil] Swelling    "I gain  1 pound a day for 30 days."    REVIEW OF SYSTEMS:   CONSTITUTIONAL: No fever, fatigue or weakness.  EYES: No changes in vision.  EARS, NOSE, AND THROAT: No tinnitus or ear pain.  RESPIRATORY: Positive for shortness of breath.  No cough, wheezing or hemoptysis.  CARDIOVASCULAR: Positive for chest pain and orthopnea; no edema.  GASTROINTESTINAL: No nausea, vomiting, diarrhea or abdominal pain.  GENITOURINARY: No dysuria, hematuria.  ENDOCRINE: No polyuria, nocturia. HEMATOLOGY: No  bleeding. SKIN: No rash or lesion. MUSCULOSKELETAL: No joint pain at this time.   NEUROLOGIC: No focal weakness.  PSYCHIATRY: No anxiety or depression.   MEDICATIONS AT HOME:  Prior to Admission medications   Medication Sig Start Date End Date Taking? Authorizing Provider  acetaminophen (TYLENOL) 500 MG tablet Take 1,000 mg by mouth daily as needed for moderate pain.   Yes [provider]  albuterol (PROVENTIL HFA;VENTOLIN HFA) 108 (90 Base) MCG/ACT inhaler Inhale 2 puffs into the lungs every 6 (six) hours as needed for wheezing or shortness of breath. 06/04/18  Yes [name], [name], MD  allopurinol (ZYLOPRIM) 100 MG tablet Take 100 mg by mouth daily.   Yes [provider]  atorvastatin (LIPITOR) 80 MG tablet Take 1 tablet (80 mg total) by mouth every evening. 04/21/17  Yes Theodoro [name], MD  budesonide-formoterol (SYMBICORT) 80-4.5 MCG/ACT inhaler Inhale 2 puffs into the lungs 2 (two) times daily.   Yes [provider]  calcium carbonate (TUMS - DOSED IN MG ELEMENTAL CALCIUM) 500 MG chewable tablet Chew 1 tablet by mouth as needed for indigestion or heartburn.   Yes [provider]  carvedilol (COREG) 3.125 MG tablet Take 1 tablet (3.125 mg total) by mouth 2 (two) times daily with a meal. 01/01/18  Yes Gladstone [name], MD  clopidogrel (PLAVIX) 75 MG tablet Take 75 mg by mouth every morning.   Yes [provider]  co-enzyme Q-10 30 MG capsule Take 30 mg by mouth daily.   Yes [provider]  ferrous sulfate 325 (65 FE) MG tablet Take 325 mg by mouth 2 (two) times daily with a meal.   Yes [provider]  FLUoxetine (PROZAC) 20 MG capsule Take 20 mg by mouth daily.   Yes [provider]  gabapentin (NEURONTIN) 300 MG capsule Take 300 mg by mouth 2 (two) times daily.    Yes [provider]  insulin glargine (LANTUS) 100 UNIT/ML injection Inject 0.6 mLs (60 Units total) into the skin at bedtime. Patient taking  differently: Inject 60 Units into the skin at bedtime. Take 60-80 units at bedtime depending on what he has eaten. 01/01/18  Yes Gladstone [name], MD  insulin lispro (HUMALOG) 100 UNIT/ML KiwkPen Inject 0-18 Units into the skin 3 (three) times daily with meals. Per sliding scale   Yes [provider]  lansoprazole (PREVACID) 15 MG capsule Take 30 mg by mouth 2 (two) times daily.   Yes [provider]  loratadine (CLARITIN) 10 MG tablet Take 10 mg by mouth daily.   Yes [provider]  Magnesium Oxide 400 (240 Mg) MG TABS Take 1 tablet (400 mg total) by mouth 2 (two) times daily. 05/30/18  Yes Hackney, Otila Kluver A, FNP  Multiple Vitamins-Minerals (CENTRUM SILVER PO) Take 1 tablet by mouth every morning.   Yes [provider]  Multiple Vitamins-Minerals (PRESERVISION AREDS 2) CAPS Take 1 capsule by mouth 2 (two) times daily. For macular degeneration- eye vitamins   Yes [provider]  nitroGLYCERIN (NITROSTAT) 0.4 MG  SL tablet Place 0.4 mg under the tongue every 5 (five) minutes as needed for chest pain.   Yes [provider]  ramipril (ALTACE) 2.5 MG capsule Take 2.5 mg by mouth daily.   Yes [provider]  ranolazine (RANEXA) 500 MG 12 hr tablet Take 1 tablet (500 mg total) by mouth 2 (two) times daily. 05/30/18  Yes Hackney, Otila Kluver A, FNP  spironolactone (ALDACTONE) 25 MG tablet Take 25 mg by mouth daily.    Yes [provider]  tamsulosin (FLOMAX) 0.4 MG CAPS capsule Take 0.4 mg by mouth at bedtime.    Yes [provider]  tiotropium (SPIRIVA) 18 MCG inhalation capsule Place 1 capsule (18 mcg total) into inhaler and inhale daily. 06/08/18  Yes Wieting, Richard, MD  torsemide (DEMADEX) 20 MG tablet Take 1 tablet (20 mg total) by mouth 2 (two) times daily. 1 tab po twice a day.  For weight gain of 3 lbs in one day or five lbs in a week then increase to 2 tabs in am and one in afternoon 06/08/18  Yes Wieting, Richard, MD       PHYSICAL EXAMINATION:   VITAL SIGNS: Blood pressure (!) 131/58, pulse 75, temperature 97.8 F (36.6 C), temperature source Oral, resp. rate 18, height 5\' 7"  (1.702 m), weight 108.4 kg, SpO2 97 %.  GENERAL:  72 y.o.-year-old patient, sitting up in the bed with mild respiratory distress.  EYES: Pupils equal, round, reactive to light and accommodation. No scleral icterus. Extraocular muscles intact.  HEENT: Head atraumatic, normocephalic. Oropharynx and nasopharynx clear.  NECK:  Supple, no jugular venous distention. No thyroid enlargement, no tenderness.  LUNGS: Reduced breath sounds bilaterally, no wheezing; bibasilar crackles are heard. No use of accessory muscles of respiration, at this time.  CARDIOVASCULAR: S1, S2 normal. No S3/S4.  ABDOMEN: Soft, nontender, nondistended. Bowel sounds present. No organomegaly or mass.  EXTREMITIES: No pedal edema, cyanosis, or clubbing.  NEUROLOGIC: Cranial nerves II through XII are intact. Muscle strength 5/5 in all extremities. Sensation intact.   PSYCHIATRIC: The patient is alert and oriented x 3.  SKIN: No obvious rash, lesion, or ulcer.   LABORATORY PANEL:   CBC Recent Labs  Lab 09/02/18 1939  WBC 9.1  HGB 10.9*  HCT 31.3*  PLT 205  MCV 91.2  MCH 31.8  MCHC 34.8  RDW 14.8*   ------------------------------------------------------------------------------------------------------------------  Chemistries  Recent Labs  Lab 09/02/18 1939  NA 141  K 3.7  CL 102  CO2 27  GLUCOSE 105*  BUN 17  CREATININE 1.31*  CALCIUM 8.7*   ------------------------------------------------------------------------------------------------------------------ estimated creatinine clearance is 59 mL/min (A) (by C-G formula based on SCr of 1.31 mg/dL (H)). ------------------------------------------------------------------------------------------------------------------ No results for input(s): TSH, T4TOTAL, T3FREE, THYROIDAB in the last 72  hours.  Invalid input(s): FREET3   Coagulation profile No results for input(s): INR, PROTIME in the last 168 hours. ------------------------------------------------------------------------------------------------------------------- No results for input(s): DDIMER in the last 72 hours. -------------------------------------------------------------------------------------------------------------------  Cardiac Enzymes Recent Labs  Lab 09/02/18 1939  TROPONINI 0.06*   ------------------------------------------------------------------------------------------------------------------ Invalid input(s): POCBNP  ---------------------------------------------------------------------------------------------------------------  Urinalysis    Component Value Date/Time   COLORURINE YELLOW (A) 11/22/2017 1756   APPEARANCEUR CLEAR (A) 11/22/2017 1756   APPEARANCEUR Hazy 11/26/2012 2236   LABSPEC 1.008 11/22/2017 1756   LABSPEC 1.025 11/26/2012 2236   PHURINE 5.0 11/22/2017 1756   GLUCOSEU NEGATIVE 11/22/2017 1756   GLUCOSEU Negative 11/26/2012 2236   HGBUR NEGATIVE 11/22/2017 1756   BILIRUBINUR NEGATIVE 11/22/2017 1756   BILIRUBINUR Negative  11/26/2012 2236   KETONESUR NEGATIVE 11/22/2017 1756   PROTEINUR NEGATIVE 11/22/2017 1756   NITRITE NEGATIVE 11/22/2017 1756   LEUKOCYTESUR NEGATIVE 11/22/2017 1756   LEUKOCYTESUR Negative 11/26/2012 2236     RADIOLOGY: Dg Chest 2 View  Result Date: 09/02/2018 CLINICAL DATA:  Chest pain EXAM: CHEST - 2 VIEW COMPARISON:  06/06/2018 chest radiograph. FINDINGS: Intact sternotomy wires. Three lead left subclavian ICD is stable in configuration. Stable cardiomediastinal silhouette with mild cardiomegaly. No pneumothorax. Small bilateral pleural effusions. Mild pulmonary edema. IMPRESSION: 1. Mild congestive heart failure. 2. Small bilateral pleural effusions. Electronically Signed   By: Ilona Sorrel M.D.   On: 09/02/2018 20:19    EKG: Orders placed or  performed during the hospital encounter of 09/02/18  . ED EKG within 10 minutes  . ED EKG within 10 minutes    IMPRESSION AND PLAN:  1.  Acute on chronic respiratory failure with hypoxia, likely multifactorial secondary to COPD and CHF exacerbation.  We will treat with home oxygen, nebulizer and Lasix IV.  Continue to monitor patient on telemetry and follow troponin levels to rule out ACS.  Will check 2D echo to further evaluate cardiac function. 2.  Acute COPD exacerbation, see treatment as above under #1. 3.  Acute systolic CHF on chronic systolic CHF, see treatment as above under #1.  4.  Elevated troponin level, could be related to demand ischemia from acute respiratory failure.  Will rule out ACS.  Continue to monitor patient on telemetry and follow troponin levels.  Check 2D echo.  Cardiology is consulted for further evaluation and treatment. 5.  Diabetes type 2.  Low-carb diet.  Will monitor blood sugars before meals and at bedtime.  Will use insulin treatment during the hospital stay. 6.  CKD 3, creatinine stable, at baseline.  Continue to monitor kidney function closely and avoid nephrotoxic medications. 7.  Obstructive sleep apnea, continue CPAP at night.   All the records are reviewed and case discussed with ED provider. Management plans discussed with the patient, family and they are in agreement.  CODE STATUS: Full Code Status History    Date Active Date Inactive Code Status Order ID Comments User Context   06/06/2018 1529 06/08/2018 1410 Full Code [id number]  Epifanio Lesches, MD ED   02/18/2018 1248 02/19/2018 1854 Full Code [id number]  Idelle Crouch, MD Inpatient   12/30/2017 0305 01/01/2018 1609 Full Code [id number]  Harrie Foreman, MD ED   12/05/2017 1105 12/06/2017 1659 Full Code [id number]  Harrie Foreman, MD ED   07/17/2017 0439 07/18/2017 1759 Full Code [id number]  Harrie Foreman, MD Inpatient   07/01/2017 0119 07/02/2017 1546 Full Code [id number]  Lance Coon, MD  Inpatient   04/20/2017 0232 04/21/2017 1816 Full Code [id number]  Saundra Shelling, MD Inpatient   01/31/2016 0143 02/02/2016 2111 Full Code [id number]  Hillary Bow, MD ED   12/12/2015 1627 12/13/2015 1529 Full Code [id number]  [name], MD Inpatient       TOTAL TIME TAKING CARE OF THIS PATIENT: 55 minutes.    Amelia Jo M.D on 09/02/2018 at 10:07 PM  Between 7am to 6pm - Pager - [phone]  After 6pm go to [email or web address] - password [email or web address] [facility] [place] [facility]  [phone]  CC: Primary care physician; [name], MD

## 2018-09-02 NOTE — ED Notes (Addendum)
Dr Corky Downs made aware at this time of pt's elevated Troponin level as reported by lab at 20:14. Troponin 0.06ng/mL. Delay in relaying information to Dr Corky Downs caused to him being involved in the care of a critical pt in another room until this time.

## 2018-09-02 NOTE — ED Notes (Signed)
Delay to round, assess, and call report d/t critical pt in room 26

## 2018-09-02 NOTE — ED Provider Notes (Signed)
[facility] Emergency Department Provider Note   ____________________________________________    I have reviewed the triage vital signs and the nursing notes.   HISTORY  Chief Complaint Chest Pain     HPI [name] is a 72 y.o. male with a history of CHF, COPD, diabetes who wears oxygen when needed at home who presents today with shortness of breath that is worsened over the last 3 days.  He describes intermittent chest pain, none currently.  He is unable to tell whether this is COPD or CHF.  He states that he came earlier than he usually does.  Denies fevers or chills.  No cough.  No calf pain or swelling.  Reports compliance with his medications.  Dr. Nehemiah Massed is his cardiologist  Past Medical History:  Diagnosis Date  . Anemia   . Cancer ([place]) 12/2013   prostate  . Cardiogenic pulmonary edema ([place]) 12/19/2014  . Cardiomyopathy, ischemic   . CHF (congestive heart failure) ([place])   . COPD (chronic obstructive pulmonary disease) ([place])   . Diabetes mellitus without complication ([place])   . GERD (gastroesophageal reflux disease)   . Hypercholesteremia   . Hypertension   . Myocardial infarction ([place]) [id number]  . Shortness of breath dyspnea     Patient Active Problem List   Diagnosis Date Noted  . Acute respiratory failure ([place]) 09/02/2018  . COPD exacerbation ([place]) 06/06/2018  . Acute on chronic systolic CHF (congestive heart failure) ([place]) 04/21/2017  . Hypokalemia 04/21/2017  . Hypomagnesemia 04/21/2017  . Essential hypertension 04/21/2017  . Hyperlipidemia 04/21/2017  . Coronary artery disease 04/21/2017  . COPD with chronic bronchitis ([place]) 02/13/2016  . Obstructive sleep apnea 02/13/2016  . Diabetes ([place]) 02/13/2016  . Chronic systolic HF (heart failure) ([place]) 12/12/2015    Past Surgical History:  Procedure Laterality Date  . CARDIAC CATHETERIZATION N/A 02/02/2016   Procedure: Left Heart Cath and Coronary Angiography;   Surgeon: [name], MD;  Location: [facility];  Service: Cardiovascular;  Laterality: N/A;  . CORONARY ANGIOPLASTY WITH STENT PLACEMENT    . CORONARY ARTERY BYPASS GRAFT  11/24/2010  . IMPLANTABLE CARDIOVERTER DEFIBRILLATOR (ICD) GENERATOR CHANGE Left 12/12/2015   Procedure: DUAL LEAD PLACEMENT CARDIAC DIFIBRILLATOR;  Surgeon: [name], MD;  Location: [facility] ORS;  Service: Cardiovascular;  Laterality: Left;  . TONSILLECTOMY      Prior to Admission medications   Medication Sig Start Date End Date Taking? Authorizing Provider  acetaminophen (TYLENOL) 500 MG tablet Take 1,000 mg by mouth daily as needed for moderate pain.   Yes [provider]  albuterol (PROVENTIL HFA;VENTOLIN HFA) 108 (90 Base) MCG/ACT inhaler Inhale 2 puffs into the lungs every 6 (six) hours as needed for wheezing or shortness of breath. 06/04/18  Yes [name], [name], MD  allopurinol (ZYLOPRIM) 100 MG tablet Take 100 mg by mouth daily.   Yes [provider]  atorvastatin (LIPITOR) 80 MG tablet Take 1 tablet (80 mg total) by mouth every evening. 04/21/17  Yes Theodoro [name], MD  budesonide-formoterol (SYMBICORT) 80-4.5 MCG/ACT inhaler Inhale 2 puffs into the lungs 2 (two) times daily.   Yes [provider]  calcium carbonate (TUMS - DOSED IN MG ELEMENTAL CALCIUM) 500 MG chewable tablet Chew 1 tablet by mouth as needed for indigestion or heartburn.   Yes [provider]  carvedilol (COREG) 3.125 MG tablet Take 1 tablet (3.125 mg total) by mouth 2 (two) times daily with a meal. 01/01/18  Yes Gladstone [name],  MD  clopidogrel (PLAVIX) 75 MG tablet Take 75 mg by mouth every morning.   Yes [provider]  co-enzyme Q-10 30 MG capsule Take 30 mg by mouth daily.   Yes [provider]  ferrous sulfate 325 (65 FE) MG tablet Take 325 mg by mouth 2 (two) times daily with a meal.   Yes [provider]  FLUoxetine (PROZAC) 20 MG capsule Take 20 mg by mouth  daily.   Yes [provider]  gabapentin (NEURONTIN) 300 MG capsule Take 300 mg by mouth 2 (two) times daily.    Yes [provider]  insulin glargine (LANTUS) 100 UNIT/ML injection Inject 0.6 mLs (60 Units total) into the skin at bedtime. Patient taking differently: Inject 60 Units into the skin at bedtime. Take 60-80 units at bedtime depending on what he has eaten. 01/01/18  Yes Gladstone [name], MD  insulin lispro (HUMALOG) 100 UNIT/ML KiwkPen Inject 0-18 Units into the skin 3 (three) times daily with meals. Per sliding scale   Yes [provider]  lansoprazole (PREVACID) 15 MG capsule Take 30 mg by mouth 2 (two) times daily.   Yes [provider]  loratadine (CLARITIN) 10 MG tablet Take 10 mg by mouth daily.   Yes [provider]  Magnesium Oxide 400 (240 Mg) MG TABS Take 1 tablet (400 mg total) by mouth 2 (two) times daily. 05/30/18  Yes Hackney, Otila Kluver A, FNP  Multiple Vitamins-Minerals (CENTRUM SILVER PO) Take 1 tablet by mouth every morning.   Yes [provider]  Multiple Vitamins-Minerals (PRESERVISION AREDS 2) CAPS Take 1 capsule by mouth 2 (two) times daily. For macular degeneration- eye vitamins   Yes [provider]  nitroGLYCERIN (NITROSTAT) 0.4 MG SL tablet Place 0.4 mg under the tongue every 5 (five) minutes as needed for chest pain.   Yes [provider]  ramipril (ALTACE) 2.5 MG capsule Take 2.5 mg by mouth daily.   Yes [provider]  ranolazine (RANEXA) 500 MG 12 hr tablet Take 1 tablet (500 mg total) by mouth 2 (two) times daily. 05/30/18  Yes Hackney, Otila Kluver A, FNP  spironolactone (ALDACTONE) 25 MG tablet Take 25 mg by mouth daily.    Yes [provider]  tamsulosin (FLOMAX) 0.4 MG CAPS capsule Take 0.4 mg by mouth at bedtime.    Yes [provider]  tiotropium (SPIRIVA) 18 MCG inhalation capsule Place 1 capsule (18 mcg total) into inhaler and inhale daily. 06/08/18  Yes Wieting,  Richard, MD  torsemide (DEMADEX) 20 MG tablet Take 1 tablet (20 mg total) by mouth 2 (two) times daily. 1 tab po twice a day.  For weight gain of 3 lbs in one day or five lbs in a week then increase to 2 tabs in am and one in afternoon 06/08/18  Yes Loletha Grayer, MD     Allergies Tuberculin tests; Benadryl [diphenhydramine]; Doxycycline; and Lopid [gemfibrozil]  Family History  Problem Relation Age of Onset  . CAD Mother   . Cancer Mother   . Diabetes Mother   . Alzheimer's disease Father   . Cancer Father   . Heart disease Father     Social History Social History   Tobacco Use  . Smoking status: Former Smoker    Packs/day: 2.00    Years: 30.00    Pack years: 60.00    Last attempt to quit: 12/03/1996    Years since quitting: 21.7  . Smokeless tobacco: Never Used  Substance Use Topics  .  Alcohol use: No    Alcohol/week: 0.0 standard drinks  . Drug use: No    Review of Systems  Constitutional: No fever/chills Eyes: No visual changes.  ENT: No sore throat. Cardiovascular: As above Respiratory: As above Gastrointestinal: No abdominal pain.  No nausea, no vomiting.   Genitourinary: Negative for dysuria. Musculoskeletal: Negative for back pain. Skin: Negative for rash. Neurological: Negative for headaches    ____________________________________________   PHYSICAL EXAM:  VITAL SIGNS: ED Triage Vitals  Enc Vitals Group     BP 09/02/18 1931 (!) 131/58     Pulse Rate 09/02/18 1931 75     Resp 09/02/18 1931 18     Temp 09/02/18 1931 97.8 F (36.6 C)     Temp Source 09/02/18 1931 Oral     SpO2 09/02/18 1931 97 %     Weight 09/02/18 1930 108.4 kg (239 lb)     Height 09/02/18 1930 1.702 m (5\' 7" )     Head Circumference --      Peak Flow --      Pain Score 09/02/18 1929 1     Pain Loc --      Pain Edu? --      Excl. in [place]? --     Constitutional: Alert and oriented. Eyes: Conjunctivae are normal.   Mouth/Throat: Mucous membranes are moist.   Neck:   Painless ROM Cardiovascular: Normal rate, regular rhythm. Grossly normal heart sounds.  Good peripheral circulation. Respiratory: Normal respiratory effort.  No retractions.  Bibasilar Rales Gastrointestinal: Soft and nontender. No distention.    Musculoskeletal: No lower extremity tenderness nor edema.  Warm and well perfused Neurologic:  Normal speech and language. No gross focal neurologic deficits are appreciated.  Skin:  Skin is warm, dry and intact. No rash noted. Psychiatric: Mood and affect are normal. Speech and behavior are normal.  ____________________________________________   LABS (all labs ordered are listed, but only abnormal results are displayed)  Labs Reviewed  BASIC METABOLIC PANEL - Abnormal; Notable for the following components:      Result Value   Glucose, Bld 105 (*)    Creatinine, Ser 1.31 (*)    Calcium 8.7 (*)    GFR calc non Af Amer 52 (*)    All other components within normal limits  CBC - Abnormal; Notable for the following components:   RBC 3.43 (*)    Hemoglobin 10.9 (*)    HCT 31.3 (*)    RDW 14.8 (*)    All other components within normal limits  TROPONIN I - Abnormal; Notable for the following components:   Troponin I 0.06 (*)    All other components within normal limits  BRAIN NATRIURETIC PEPTIDE   ____________________________________________  EKG     ____________________________________________  RADIOLOGY  Chest x-ray shows mild pulmonary edema ____________________________________________   PROCEDURES  Procedure(s) performed: No  Procedures   Critical Care performed: No ____________________________________________   INITIAL IMPRESSION / ASSESSMENT AND PLAN / ED COURSE  Pertinent labs & imaging results that were available during my care of the patient were reviewed by me and considered in my medical decision making (see chart for details).  Patient presents with shortness of breath, bibasilar Rales.  X-ray confirms  mild pulmonary edema.  IV Lasix ordered.  Will admit to the hospitalist service    ____________________________________________   FINAL CLINICAL IMPRESSION(S) / ED DIAGNOSES  Final diagnoses:  Acute pulmonary edema ([place])        Note:  This document was prepared  using Systems analyst and may include unintentional dictation errors.    Lavonia Drafts, MD 09/02/18 2248

## 2018-09-03 ENCOUNTER — Inpatient Hospital Stay
Admit: 2018-09-03 | Discharge: 2018-09-03 | Disposition: A | Discharge status: Home / Self Care | Payer: PPO | Attending: Internal Medicine | Admitting: Internal Medicine

## 2018-09-03 ENCOUNTER — Other Ambulatory Visit: Payer: Self-pay

## 2018-09-03 DIAGNOSIS — I5022 Chronic systolic (congestive) heart failure: Secondary | ICD-10-CM

## 2018-09-03 LAB — CBC
HCT: 28.3 % — ABNORMAL LOW (ref 40.0–52.0)
Hemoglobin: 9.9 g/dL — ABNORMAL LOW (ref 13.0–18.0)
MCH: 31.8 pg (ref 26.0–34.0)
MCHC: 35 g/dL (ref 32.0–36.0)
MCV: 90.6 fL (ref 80.0–100.0)
PLATELETS: 181 10*3/uL (ref 150–440)
RBC: 3.13 MIL/uL — ABNORMAL LOW (ref 4.40–5.90)
RDW: 14.9 % — AB (ref 11.5–14.5)
WBC: 7.4 10*3/uL (ref 3.8–10.6)

## 2018-09-03 LAB — GLUCOSE, CAPILLARY
GLUCOSE-CAPILLARY: 123 mg/dL — AB (ref 70–99)
Glucose-Capillary: 93 mg/dL (ref 70–99)
Glucose-Capillary: 115 mg/dL — ABNORMAL HIGH (ref 70–99)
Glucose-Capillary: 120 mg/dL — ABNORMAL HIGH (ref 70–99)
Glucose-Capillary: 136 mg/dL — ABNORMAL HIGH (ref 70–99)

## 2018-09-03 LAB — ECHOCARDIOGRAM COMPLETE
HEIGHTINCHES: 68 in
Weight: 3806.02 oz

## 2018-09-03 LAB — TROPONIN I: Troponin I: 0.06 ng/mL (ref <0.03)

## 2018-09-03 LAB — BASIC METABOLIC PANEL
Anion gap: 9 (ref 5–15)
BUN: 17 mg/dL (ref 8–23)
CALCIUM: 8.4 mg/dL — AB (ref 8.9–10.3)
CO2: 28 mmol/L (ref 22–32)
CREATININE: 1.23 mg/dL (ref 0.61–1.24)
Chloride: 103 mmol/L (ref 98–111)
GFR calc non Af Amer: 56 mL/min — ABNORMAL LOW (ref >60)
GLUCOSE: 160 mg/dL — AB (ref 70–99)
Potassium: 3.2 mmol/L — ABNORMAL LOW (ref 3.5–5.1)
Sodium: 140 mmol/L (ref 135–145)

## 2018-09-03 MED ORDER — CALCIUM CARBONATE ANTACID 500 MG PO CHEW: 1.0000 | CHEWABLE_TABLET | ORAL | Status: DC | PRN

## 2018-09-03 MED ORDER — ADULT MULTIVITAMIN W/MINERALS CH
1.0000 | ORAL_TABLET | ORAL | Status: DC
  Administered 2018-09-03 – 2018-09-05 (×2): 1 via ORAL
  Filled 2018-09-03 (×2): qty 1

## 2018-09-03 MED ORDER — ATORVASTATIN CALCIUM 20 MG PO TABS
80.0000 mg | ORAL_TABLET | Freq: Every evening | ORAL | Status: DC
  Administered 2018-09-03 – 2018-09-04 (×2): 80 mg via ORAL
  Filled 2018-09-03 (×2): qty 4

## 2018-09-03 MED ORDER — FUROSEMIDE 10 MG/ML IJ SOLN
20.0000 mg | Freq: Two times a day (BID) | INTRAMUSCULAR | Status: DC
  Administered 2018-09-03 – 2018-09-04 (×3): 20 mg via INTRAVENOUS
  Filled 2018-09-03 (×3): qty 2

## 2018-09-03 MED ORDER — RAMIPRIL 2.5 MG PO CAPS
2.5000 mg | ORAL_CAPSULE | Freq: Every day | ORAL | Status: DC
  Administered 2018-09-03 – 2018-09-05 (×3): 2.5 mg via ORAL
  Filled 2018-09-03 (×3): qty 1

## 2018-09-03 MED ORDER — ACETAMINOPHEN 325 MG PO TABS: 650.0000 mg | ORAL_TABLET | Freq: Four times a day (QID) | ORAL | Status: DC | PRN

## 2018-09-03 MED ORDER — HEPARIN SODIUM (PORCINE) 5000 UNIT/ML IJ SOLN
5000.0000 [IU] | Freq: Three times a day (TID) | INTRAMUSCULAR | Status: DC
  Administered 2018-09-03 – 2018-09-05 (×6): 5000 [IU] via SUBCUTANEOUS
  Filled 2018-09-03 (×7): qty 1

## 2018-09-03 MED ORDER — MAGNESIUM OXIDE 400 (241.3 MG) MG PO TABS
400.0000 mg | ORAL_TABLET | Freq: Two times a day (BID) | ORAL | Status: DC
  Administered 2018-09-03 – 2018-09-05 (×6): 400 mg via ORAL
  Filled 2018-09-03 (×6): qty 1

## 2018-09-03 MED ORDER — SODIUM CHLORIDE 0.9% FLUSH
3.0000 mL | Freq: Two times a day (BID) | INTRAVENOUS | Status: DC
  Administered 2018-09-03 – 2018-09-04 (×2): 3 mL via INTRAVENOUS

## 2018-09-03 MED ORDER — OCUVITE-LUTEIN PO CAPS
1.0000 | ORAL_CAPSULE | Freq: Two times a day (BID) | ORAL | Status: DC
  Administered 2018-09-03 – 2018-09-05 (×5): 1 via ORAL
  Filled 2018-09-03 (×7): qty 1

## 2018-09-03 MED ORDER — BISACODYL 5 MG PO TBEC: 5.0000 mg | DELAYED_RELEASE_TABLET | Freq: Every day | ORAL | Status: DC | PRN

## 2018-09-03 MED ORDER — PANTOPRAZOLE SODIUM 40 MG PO TBEC
40.0000 mg | DELAYED_RELEASE_TABLET | Freq: Every day | ORAL | Status: DC
  Administered 2018-09-03 – 2018-09-05 (×3): 40 mg via ORAL
  Filled 2018-09-03 (×3): qty 1

## 2018-09-03 MED ORDER — TAMSULOSIN HCL 0.4 MG PO CAPS
0.4000 mg | ORAL_CAPSULE | Freq: Every day | ORAL | Status: DC
  Administered 2018-09-03 – 2018-09-04 (×3): 0.4 mg via ORAL
  Filled 2018-09-03 (×3): qty 1

## 2018-09-03 MED ORDER — PERFLUTREN LIPID MICROSPHERE
1.0000 mL | INTRAVENOUS | Status: AC | PRN
  Administered 2018-09-03: 3 mL via INTRAVENOUS
  Filled 2018-09-03: qty 10

## 2018-09-03 MED ORDER — FLUTICASONE FUROATE-VILANTEROL 100-25 MCG/INH IN AEPB
1.0000 | INHALATION_SPRAY | Freq: Every day | RESPIRATORY_TRACT | Status: DC
  Administered 2018-09-03 – 2018-09-05 (×3): 1 via RESPIRATORY_TRACT
  Filled 2018-09-03: qty 28

## 2018-09-03 MED ORDER — ALLOPURINOL 100 MG PO TABS
100.0000 mg | ORAL_TABLET | Freq: Every day | ORAL | Status: DC
  Administered 2018-09-03 – 2018-09-05 (×3): 100 mg via ORAL
  Filled 2018-09-03 (×3): qty 1

## 2018-09-03 MED ORDER — TIOTROPIUM BROMIDE MONOHYDRATE 18 MCG IN CAPS
18.0000 ug | ORAL_CAPSULE | Freq: Every day | RESPIRATORY_TRACT | Status: DC
  Administered 2018-09-03 – 2018-09-05 (×3): 18 ug via RESPIRATORY_TRACT
  Filled 2018-09-03: qty 5

## 2018-09-03 MED ORDER — ONDANSETRON HCL 4 MG/2ML IJ SOLN: 4.0000 mg | Freq: Four times a day (QID) | INTRAMUSCULAR | Status: DC | PRN

## 2018-09-03 MED ORDER — INSULIN ASPART 100 UNIT/ML ~~LOC~~ SOLN
0.0000 [IU] | Freq: Three times a day (TID) | SUBCUTANEOUS | Status: DC
  Administered 2018-09-03: 1 [IU] via SUBCUTANEOUS
  Administered 2018-09-04: 2 [IU] via SUBCUTANEOUS
  Administered 2018-09-05: 3 [IU] via SUBCUTANEOUS
  Filled 2018-09-03 (×3): qty 1

## 2018-09-03 MED ORDER — ACETAMINOPHEN 650 MG RE SUPP: 650.0000 mg | Freq: Four times a day (QID) | RECTAL | Status: DC | PRN

## 2018-09-03 MED ORDER — FUROSEMIDE 10 MG/ML IJ SOLN: 40.0000 mg | Freq: Two times a day (BID) | INTRAMUSCULAR | Status: DC

## 2018-09-03 MED ORDER — CARVEDILOL 3.125 MG PO TABS
3.1250 mg | ORAL_TABLET | Freq: Two times a day (BID) | ORAL | Status: DC
  Administered 2018-09-03 – 2018-09-05 (×4): 3.125 mg via ORAL
  Filled 2018-09-03 (×4): qty 1

## 2018-09-03 MED ORDER — DOCUSATE SODIUM 100 MG PO CAPS
100.0000 mg | ORAL_CAPSULE | Freq: Two times a day (BID) | ORAL | Status: DC
  Administered 2018-09-03 – 2018-09-05 (×6): 100 mg via ORAL
  Filled 2018-09-03 (×6): qty 1

## 2018-09-03 MED ORDER — GABAPENTIN 300 MG PO CAPS
300.0000 mg | ORAL_CAPSULE | Freq: Two times a day (BID) | ORAL | Status: DC
  Administered 2018-09-03 – 2018-09-05 (×6): 300 mg via ORAL
  Filled 2018-09-03 (×6): qty 1

## 2018-09-03 MED ORDER — NITROGLYCERIN 0.4 MG SL SUBL: 0.4000 mg | SUBLINGUAL_TABLET | SUBLINGUAL | Status: DC | PRN

## 2018-09-03 MED ORDER — HYDROCODONE-ACETAMINOPHEN 5-325 MG PO TABS: 1.0000 | ORAL_TABLET | ORAL | Status: DC | PRN

## 2018-09-03 MED ORDER — ONDANSETRON HCL 4 MG PO TABS: 4.0000 mg | ORAL_TABLET | Freq: Four times a day (QID) | ORAL | Status: DC | PRN

## 2018-09-03 MED ORDER — INSULIN GLARGINE 100 UNIT/ML ~~LOC~~ SOLN
40.0000 [IU] | Freq: Every day | SUBCUTANEOUS | Status: DC
  Administered 2018-09-03 – 2018-09-04 (×2): 40 [IU] via SUBCUTANEOUS
  Filled 2018-09-03 (×3): qty 0.4

## 2018-09-03 MED ORDER — RANOLAZINE ER 500 MG PO TB12
500.0000 mg | ORAL_TABLET | Freq: Two times a day (BID) | ORAL | Status: DC
  Administered 2018-09-03 – 2018-09-05 (×5): 500 mg via ORAL
  Filled 2018-09-03 (×7): qty 1

## 2018-09-03 MED ORDER — TORSEMIDE 20 MG PO TABS
20.0000 mg | ORAL_TABLET | Freq: Two times a day (BID) | ORAL | Status: DC
  Filled 2018-09-03: qty 1

## 2018-09-03 MED ORDER — LORATADINE 10 MG PO TABS
10.0000 mg | ORAL_TABLET | Freq: Every day | ORAL | Status: DC
  Administered 2018-09-03 – 2018-09-05 (×3): 10 mg via ORAL
  Filled 2018-09-03 (×3): qty 1

## 2018-09-03 MED ORDER — SPIRONOLACTONE 25 MG PO TABS
25.0000 mg | ORAL_TABLET | Freq: Every day | ORAL | Status: DC
  Administered 2018-09-03 – 2018-09-05 (×3): 25 mg via ORAL
  Filled 2018-09-03 (×3): qty 1

## 2018-09-03 MED ORDER — CLOPIDOGREL BISULFATE 75 MG PO TABS
75.0000 mg | ORAL_TABLET | ORAL | Status: DC
  Administered 2018-09-03 – 2018-09-05 (×2): 75 mg via ORAL
  Filled 2018-09-03 (×3): qty 1

## 2018-09-03 MED ORDER — COENZYME Q10 30 MG PO CAPS: 30.0000 mg | ORAL_CAPSULE | Freq: Every day | ORAL | Status: DC

## 2018-09-03 MED ORDER — FLUOXETINE HCL 20 MG PO CAPS
20.0000 mg | ORAL_CAPSULE | Freq: Every day | ORAL | Status: DC
  Administered 2018-09-03 – 2018-09-05 (×3): 20 mg via ORAL
  Filled 2018-09-03 (×3): qty 1

## 2018-09-03 MED ORDER — INSULIN ASPART 100 UNIT/ML ~~LOC~~ SOLN: 0.0000 [IU] | Freq: Every day | SUBCUTANEOUS | Status: DC

## 2018-09-03 MED ORDER — FERROUS SULFATE 325 (65 FE) MG PO TABS
325.0000 mg | ORAL_TABLET | Freq: Two times a day (BID) | ORAL | Status: DC
  Administered 2018-09-03 – 2018-09-05 (×4): 325 mg via ORAL
  Filled 2018-09-03 (×4): qty 1

## 2018-09-03 MED ORDER — TRAZODONE HCL 50 MG PO TABS: 25.0000 mg | ORAL_TABLET | Freq: Every evening | ORAL | Status: DC | PRN

## 2018-09-03 NOTE — Consult Note (Signed)
[facility] Consultation Note  Patient ID: [name], MRN: [id number], DOB/AGE: 12-24-1945 72 y.o. Admit date: 09/02/2018   Date of Consult: 09/03/2018 Primary Physician: [name], MD Primary Cardiologist: Nehemiah Massed  Chief Complaint:  Chief Complaint  Patient presents with  . Chest Pain   Reason for Consult: Shortness of breath  HPI: 72 y.o. male with known moderate dilated cardiomyopathy status post previous coronary bypass graft who has had appropriate medication management with diabetes hypertension hyperlipidemia and cardiomyopathy.  We have maximized these medications although the patient continues to have intermittent episodes of acute congestive heart failure where he has a feeling of shortness of breath and severely gets short of breath over a 1 hour.  And then cannot breathe at all and is relieved by nitrates oxygen and diuretics.  He usually does fairly well with no evidence of elevation of troponin over the last few years.  Past cardiac catheterization shows moderate LV systolic dysfunction with occlusion of native coronary arteries moderate graft stenosis to right coronary artery and patent graft to circumflex and or LIMA to the LAD.  It is possible that the patient may have worsening stenosis of graft to the right coronary artery causing his symptoms at this time.  He does have a troponin 0 0.06 consistent with demand ischemia and hypoxia.  No further chest pain or other symptoms since admission  Past Medical History:  Diagnosis Date  . Anemia   . Cancer ([place]) 12/2013   prostate  . Cardiogenic pulmonary edema ([place]) 12/19/2014  . Cardiomyopathy, ischemic   . CHF (congestive heart failure) ([place])   . COPD (chronic obstructive pulmonary disease) ([place])   . Diabetes mellitus without complication ([place])   . GERD (gastroesophageal reflux disease)   . Hypercholesteremia   . Hypertension   . Myocardial infarction ([place]) [id number]  . Shortness of breath  dyspnea       Surgical History:  Past Surgical History:  Procedure Laterality Date  . CARDIAC CATHETERIZATION N/A 02/02/2016   Procedure: Left Heart Cath and Coronary Angiography;  Surgeon: [name], MD;  Location: [facility];  Service: Cardiovascular;  Laterality: N/A;  . CORONARY ANGIOPLASTY WITH STENT PLACEMENT    . CORONARY ARTERY BYPASS GRAFT  11/24/2010  . IMPLANTABLE CARDIOVERTER DEFIBRILLATOR (ICD) GENERATOR CHANGE Left 12/12/2015   Procedure: DUAL LEAD PLACEMENT CARDIAC DIFIBRILLATOR;  Surgeon: [name], MD;  Location: [facility];  Service: Cardiovascular;  Laterality: Left;  . TONSILLECTOMY       Home Meds: Prior to Admission medications   Medication Sig Start Date End Date Taking? Authorizing Provider  acetaminophen (TYLENOL) 500 MG tablet Take 1,000 mg by mouth daily as needed for moderate pain.   Yes [provider]  albuterol (PROVENTIL HFA;VENTOLIN HFA) 108 (90 Base) MCG/ACT inhaler Inhale 2 puffs into the lungs every 6 (six) hours as needed for wheezing or shortness of breath. 06/04/18  Yes [name], [name], MD  allopurinol (ZYLOPRIM) 100 MG tablet Take 100 mg by mouth daily.   Yes [provider]  atorvastatin (LIPITOR) 80 MG tablet Take 1 tablet (80 mg total) by mouth every evening. 04/21/17  Yes Theodoro [name], MD  budesonide-formoterol (SYMBICORT) 80-4.5 MCG/ACT inhaler Inhale 2 puffs into the lungs 2 (two) times daily.   Yes [provider]  calcium carbonate (TUMS - DOSED IN MG ELEMENTAL CALCIUM) 500 MG chewable tablet Chew 1 tablet by mouth as needed for indigestion or heartburn.   Yes [provider]  carvedilol (COREG)  3.125 MG tablet Take 1 tablet (3.125 mg total) by mouth 2 (two) times daily with a meal. 01/01/18  Yes Gladstone [name], MD  clopidogrel (PLAVIX) 75 MG tablet Take 75 mg by mouth every morning.   Yes [provider]  co-enzyme Q-10 30 MG capsule Take 30 mg by mouth daily.   Yes [provider]  ferrous sulfate 325 (65 FE) MG tablet Take 325 mg by mouth 2 (two) times daily with a meal.   Yes [provider]  FLUoxetine (PROZAC) 20 MG capsule Take 20 mg by mouth daily.   Yes [provider]  gabapentin (NEURONTIN) 300 MG capsule Take 300 mg by mouth 2 (two) times daily.    Yes [provider]  insulin glargine (LANTUS) 100 UNIT/ML injection Inject 0.6 mLs (60 Units total) into the skin at bedtime. Patient taking differently: Inject 60 Units into the skin at bedtime. Take 60-80 units at bedtime depending on what he has eaten. 01/01/18  Yes Gladstone [name], MD  insulin lispro (HUMALOG) 100 UNIT/ML KiwkPen Inject 0-18 Units into the skin 3 (three) times daily with meals. Per sliding scale   Yes [provider]  lansoprazole (PREVACID) 15 MG capsule Take 30 mg by mouth 2 (two) times daily.   Yes [provider]  loratadine (CLARITIN) 10 MG tablet Take 10 mg by mouth daily.   Yes [provider]  Magnesium Oxide 400 (240 Mg) MG TABS Take 1 tablet (400 mg total) by mouth 2 (two) times daily. 05/30/18  Yes Hackney, Otila Kluver A, FNP  Multiple Vitamins-Minerals (CENTRUM SILVER PO) Take 1 tablet by mouth every morning.   Yes [provider]  Multiple Vitamins-Minerals (PRESERVISION AREDS 2) CAPS Take 1 capsule by mouth 2 (two) times daily. For macular degeneration- eye vitamins   Yes [provider]  nitroGLYCERIN (NITROSTAT) 0.4 MG SL tablet Place 0.4 mg under the tongue every 5 (five) minutes as needed for chest pain.   Yes [provider]  ramipril (ALTACE) 2.5 MG capsule Take 2.5 mg by mouth daily.   Yes [provider]  ranolazine (RANEXA) 500 MG 12 hr tablet Take 1 tablet (500 mg total) by mouth 2 (two) times daily. 05/30/18  Yes Hackney, Otila Kluver A, FNP  spironolactone (ALDACTONE) 25 MG tablet Take 25 mg by mouth daily.    Yes [provider]  tamsulosin (FLOMAX) 0.4 MG CAPS capsule Take 0.4  mg by mouth at bedtime.    Yes [provider]  tiotropium (SPIRIVA) 18 MCG inhalation capsule Place 1 capsule (18 mcg total) into inhaler and inhale daily. 06/08/18  Yes Wieting, Richard, MD  torsemide (DEMADEX) 20 MG tablet Take 1 tablet (20 mg total) by mouth 2 (two) times daily. 1 tab po twice a day.  For weight gain of 3 lbs in one day or five lbs in a week then increase to 2 tabs in am and one in afternoon 06/08/18  Yes Loletha Grayer, MD    Inpatient Medications:  . allopurinol  100 mg Oral Daily  . atorvastatin  80 mg Oral QPM  . carvedilol  3.125 mg Oral BID WC  . clopidogrel  75 mg Oral BH-q7a  . docusate sodium  100 mg Oral BID  . ferrous sulfate  325 mg Oral BID WC  . FLUoxetine  20 mg Oral Daily  . fluticasone furoate-vilanterol  1 puff Inhalation Daily  . furosemide  20 mg Intravenous BID  . gabapentin  300 mg Oral BID  .  heparin  5,000 Units Subcutaneous Q8H  . insulin aspart  0-15 Units Subcutaneous TID WC  . insulin aspart  0-5 Units Subcutaneous QHS  . insulin glargine  40 Units Subcutaneous QHS  . loratadine  10 mg Oral Daily  . magnesium oxide  400 mg Oral BID  . multivitamin with minerals  1 tablet Oral BH-q7a  . multivitamin-lutein  1 capsule Oral BID  . pantoprazole  40 mg Oral Daily  . ramipril  2.5 mg Oral Daily  . ranolazine  500 mg Oral BID  . spironolactone  25 mg Oral Daily  . tamsulosin  0.4 mg Oral QHS  . tiotropium  18 mcg Inhalation Daily  . torsemide  20 mg Oral BID     Allergies:  Allergies  Allergen Reactions  . Tuberculin Tests Rash  . Benadryl [Diphenhydramine] Other (See Comments)    " Hyperactivity"  . Doxycycline Swelling    Pt went into pulmonary edema.  . Lopid [Gemfibrozil] Swelling    "I gain 1 pound a day for 30 days."    Social History   Socioeconomic History  . Marital status: Married    Spouse name: Not on file  . Number of children: Not on file  . Years of education: 53  . Highest education level: High  school graduate  Occupational History  . Occupation: retired  Scientific laboratory technician  . Financial resource strain: Not hard at all  . Food insecurity:    Worry: Never true    Inability: Never true  . Transportation needs:    Medical: No    Non-medical: No  Tobacco Use  . Smoking status: Former Smoker    Packs/day: 2.00    Years: 30.00    Pack years: 60.00    Last attempt to quit: 12/03/1996    Years since quitting: 21.7  . Smokeless tobacco: Never Used  Substance and Sexual Activity  . Alcohol use: No    Alcohol/week: 0.0 standard drinks  . Drug use: No  . Sexual activity: Not on file  Lifestyle  . Physical activity:    Days per week: 0 days    Minutes per session: 0 min  . Stress: Not at all  Relationships  . Social connections:    Talks on phone: More than three times a week    Gets together: More than three times a week    Attends religious service: More than 4 times per year    Active member of club or organization: No    Attends meetings of clubs or organizations: Never    Relationship status: Married  . Intimate partner violence:    Fear of current or ex partner: Patient refused    Emotionally abused: Patient refused    Physically abused: Patient refused    Forced sexual activity: Patient refused  Other Topics Concern  . Not on file  Social History Narrative  . Not on file     Family History  Problem Relation Age of Onset  . CAD Mother   . Cancer Mother   . Diabetes Mother   . Alzheimer's disease Father   . Cancer Father   . Heart disease Father      Review of Systems Positive for shortness of breath chest pain2 Negative for: General:  chills, fever, night sweats or weight changes.  Cardiovascular: PND orthopnea syncope dizziness  Dermatological skin lesions rashes Respiratory: Cough congestion Urologic: Frequent urination urination at night and hematuria Abdominal: negative for nausea, vomiting, diarrhea, bright red  blood per rectum, melena, or  hematemesis Neurologic: negative for visual changes, and/or hearing changes  All other systems reviewed and are otherwise negative except as noted above.  Labs: Recent Labs    09/02/18 1939 09/03/18 0545  TROPONINI 0.06* 0.06*   Lab Results  Component Value Date   WBC 7.4 09/03/2018   HGB 9.9 (L) 09/03/2018   HCT 28.3 (L) 09/03/2018   MCV 90.6 09/03/2018   PLT 181 09/03/2018    Recent Labs  Lab 09/03/18 0545  NA 140  K 3.2*  CL 103  CO2 28  BUN 17  CREATININE 1.23  CALCIUM 8.4*  GLUCOSE 160*   Lab Results  Component Value Date   CHOL 140 01/05/2014   HDL 33 (L) 01/05/2014   LDLCALC 86 01/05/2014   TRIG 103 01/05/2014   No results found for: DDIMER  Radiology/Studies:  Dg Chest 2 View  Result Date: 09/02/2018 CLINICAL DATA:  Chest pain EXAM: CHEST - 2 VIEW COMPARISON:  06/06/2018 chest radiograph. FINDINGS: Intact sternotomy wires. Three lead left subclavian ICD is stable in configuration. Stable cardiomediastinal silhouette with mild cardiomegaly. No pneumothorax. Small bilateral pleural effusions. Mild pulmonary edema. IMPRESSION: 1. Mild congestive heart failure. 2. Small bilateral pleural effusions. Electronically Signed   By: Ilona Sorrel M.D.   On: 09/02/2018 20:19    EKG: Sinus rhythm  Weights: Filed Weights   09/02/18 1930 09/03/18 0033 09/03/18 0353  Weight: 108.4 kg 108.4 kg 107.9 kg     Physical Exam: Blood pressure (!) 115/58, pulse 71, temperature 97.9 F (36.6 C), resp. rate 18, height 5\' 8"  (1.727 m), weight 107.9 kg, SpO2 96 %. Body mass index is 36.17 kg/m. General: Well developed, well nourished, in no acute distress. Head eyes ears nose throat: Normocephalic, atraumatic, sclera non-icteric, no xanthomas, nares are without discharge. No apparent thyromegaly and/or mass  Lungs: Normal respiratory effort.  no wheezes, no rales, no rhonchi.  Heart: RRR with normal S1 S2. no murmur gallop, no rub, PMI is normal size and placement, carotid  upstroke normal without bruit, jugular venous pressure is normal Abdomen: Soft, non-tender, non-distended with normoactive bowel sounds. No hepatomegaly. No rebound/guarding. No obvious abdominal masses. Abdominal aorta is normal size without bruit Extremities: No edema. no cyanosis, no clubbing, no ulcers  Peripheral : 2+ bilateral upper extremity pulses, 2+ bilateral femoral pulses, 2+ bilateral dorsal pedal pulse Neuro: Alert and oriented. No facial asymmetry. No focal deficit. Moves all extremities spontaneously. Musculoskeletal: Normal muscle tone without kyphosis Psych:  Responds to questions appropriately with a normal affect.    Assessment: 72 year old male with acute on chronic systolic dysfunction congestive heart failure with slight elevation of troponin consistent with demand ischemia concerning for worsening stenosis of graft to right coronary artery with no current evidence of symptoms today  Plan: 1.  Continue maximize medication management for cardiomyopathy anginal equivalent and congestive heart failure 2.  Proceed to cardiac catheterization to assess coronary anatomy and further treatment thereof is necessary.  Patient understands risk and benefits of cardiac catheterization.  This includes a possibility of death stroke heart attack infection bleeding or blood clot.  He is low risk for conscious sedation  Signed, [name] M.D. [facility] 09/03/2018, 1:57 PM

## 2018-09-03 NOTE — Progress Notes (Addendum)
[facility] [place] NAME: [name]    MR#:  [id number]  DATE OF BIRTH:  1945-12-30  SUBJECTIVE:  CHIEF COMPLAINT:   Chief Complaint  Patient presents with  . Chest Pain   Better shortness of breath and cough, on oxygen by nasal cannula 2 L.  He uses oxygen as needed at home and uses CPAP at night. REVIEW OF SYSTEMS:  Review of Systems  Constitutional: Negative for chills, fever and malaise/fatigue.  HENT: Negative for sore throat.   Eyes: Negative for blurred vision and double vision.  Respiratory: Positive for cough and shortness of breath. Negative for hemoptysis, sputum production, wheezing and stridor.   Cardiovascular: Negative for chest pain, palpitations, orthopnea and leg swelling.  Gastrointestinal: Negative for abdominal pain, blood in stool, diarrhea, melena, nausea and vomiting.  Genitourinary: Negative for dysuria, flank pain and hematuria.  Musculoskeletal: Negative for back pain and joint pain.  Skin: Negative for rash.  Neurological: Negative for dizziness, sensory change, focal weakness, seizures, loss of consciousness, weakness and headaches.  Endo/Heme/Allergies: Negative for polydipsia.  Psychiatric/Behavioral: Negative for depression. The patient is not nervous/anxious.     DRUG ALLERGIES:   Allergies  Allergen Reactions  . Tuberculin Tests Rash  . Benadryl [Diphenhydramine] Other (See Comments)    " Hyperactivity"  . Doxycycline Swelling    Pt went into pulmonary edema.  . Lopid [Gemfibrozil] Swelling    "I gain 1 pound a day for 30 days."   VITALS:  Blood pressure (!) 115/58, pulse 71, temperature 97.9 F (36.6 C), resp. rate 18, height 5\' 8"  (1.727 m), weight 107.9 kg, SpO2 96 %. PHYSICAL EXAMINATION:  Physical Exam  Constitutional: He is oriented to person, place, and time. No distress.  Obesity.  HENT:  Head: Normocephalic.  Mouth/Throat: Oropharynx is clear and moist.  Eyes: Pupils are  equal, round, and reactive to light. Conjunctivae and EOM are normal. No scleral icterus.  Neck: Normal range of motion. Neck supple. No JVD present. No tracheal deviation present.  Cardiovascular: Normal rate, regular rhythm and normal heart sounds. Exam reveals no gallop.  No murmur heard. Pulmonary/Chest: Effort normal. No stridor. No respiratory distress. He has wheezes. He has rales.  Abdominal: Soft. Bowel sounds are normal. He exhibits no distension. There is no tenderness. There is no rebound.  Musculoskeletal: Normal range of motion. He exhibits no edema or tenderness.  Neurological: He is alert and oriented to person, place, and time. No cranial nerve deficit.  Skin: No rash noted. No erythema.   LABORATORY PANEL:  Male CBC Recent Labs  Lab 09/03/18 0545  WBC 7.4  HGB 9.9*  HCT 28.3*  PLT 181   ------------------------------------------------------------------------------------------------------------------ Chemistries  Recent Labs  Lab 09/03/18 0545  NA 140  K 3.2*  CL 103  CO2 28  GLUCOSE 160*  BUN 17  CREATININE 1.23  CALCIUM 8.4*   RADIOLOGY:  Dg Chest 2 View  Result Date: 09/02/2018 CLINICAL DATA:  Chest pain EXAM: CHEST - 2 VIEW COMPARISON:  06/06/2018 chest radiograph. FINDINGS: Intact sternotomy wires. Three lead left subclavian ICD is stable in configuration. Stable cardiomediastinal silhouette with mild cardiomegaly. No pneumothorax. Small bilateral pleural effusions. Mild pulmonary edema. IMPRESSION: 1. Mild congestive heart failure. 2. Small bilateral pleural effusions. Electronically Signed   By: Ilona Sorrel M.D.   On: 09/02/2018 20:19   ASSESSMENT AND PLAN:  Tori Cupps  is a 72 y.o. male with a known history of DM2, CHF, COPD,  on home oxygen at night, OSA, on CPAP.  Most recent 2D echo, from March 2019 shows ejection fraction at 20-25%. Proceed to cardiac catheterization to assess coronary anatomy and further treatment thereof is necessary per  Dr. Nehemiah Massed.  1.  Acute on chronic respiratory failure with hypoxia, likely multifactorial secondary to COPD and CHF exacerbation.   Try to wean off oxygen, nebulizer and Lasix IV.  3.  Acute systolic CHF on chronic systolic CHF EF  75-91%,  continue Lasix IV, Altace and Coreg, CHF protocol.  4.  Elevated troponin level, could be related to demand ischemia from acute respiratory failure.  Continue Plavix and Lipitor.  5.  Diabetes type 2.  Low-carb diet.    Continue Lantus and sliding scale.    6.  CKD 3, creatinine stable, at baseline.  Continue to monitor kidney function closely and avoid nephrotoxic medications.  7.  Obstructive sleep apnea, continue CPAP at night.  All the records are reviewed and case discussed with Care Management/Social Worker. Management plans discussed with the patient, his wife and they are in agreement.  CODE STATUS: Full Code  TOTAL TIME TAKING CARE OF THIS PATIENT: 33 minutes.   More than 50% of the time was spent in counseling/coordination of care: YES  POSSIBLE D/C IN 2 DAYS, DEPENDING ON CLINICAL CONDITION.   Demetrios Loll M.D on 09/03/2018 at 2:08 PM  Between 7am to 6pm - Pager - [phone]  After 6pm go to [email or web address] - [facility]

## 2018-09-03 NOTE — Plan of Care (Signed)
  Problem: Education: Goal: Knowledge of General Education information will improve Description: Including pain rating scale, medication(s)/side effects and non-pharmacologic comfort measures Outcome: Progressing   Problem: Health Behavior/Discharge Planning: Goal: Ability to manage health-related needs will improve Outcome: Progressing   Problem: Clinical Measurements: Goal: Ability to maintain clinical measurements within normal limits will improve Outcome: Progressing Goal: Will remain free from infection Outcome: Progressing Goal: Diagnostic test results will improve Outcome: Progressing Goal: Respiratory complications will improve Outcome: Progressing Goal: Cardiovascular complication will be avoided Outcome: Progressing   Problem: Activity: Goal: Risk for activity intolerance will decrease Outcome: Progressing   Problem: Nutrition: Goal: Adequate nutrition will be maintained Outcome: Progressing   Problem: Elimination: Goal: Will not experience complications related to bowel motility Outcome: Progressing Goal: Will not experience complications related to urinary retention Outcome: Progressing   Problem: Safety: Goal: Ability to remain free from injury will improve Outcome: Progressing   

## 2018-09-03 NOTE — Progress Notes (Signed)
Advanced Care Plan.  Purpose of Encounter: CODE STATUS. Parties in Attendance: The patient, his wife in the me. Patient's Decisional Capacity: Yes. Medical Story: JamesShatterlyis a30 y.o.malewith a known history of DM2,CHF, COPD, on home oxygen at night, OSA,on CPAP. Most recent 2D echo, from March 2019 shows ejection fraction at 20-25%.  He is admitted for acute on chronic respiratory failure with hypoxia secondary to COPD and CHF exacerbation.I discussed with the patient about his current condition, prognosis and CODE STATUS.  The patient want to be resuscitated and intubated to get him back but he does not want to be on ventilation long time.  Plan:  Code Status: Full code. Time spent discussing advance care planning: 17 minutes.

## 2018-09-03 NOTE — Plan of Care (Signed)
  Problem: Coping: Goal: Level of anxiety will decrease Outcome: Completed/Met   Problem: Pain Managment: Goal: General experience of comfort will improve Outcome: Completed/Met   Problem: Skin Integrity: Goal: Risk for impaired skin integrity will decrease Outcome: Completed/Met

## 2018-09-04 ENCOUNTER — Encounter
Admission: EM | Disposition: A | Discharge status: Home / Self Care | Payer: Self-pay | Source: Home / Self Care | Attending: Internal Medicine

## 2018-09-04 ENCOUNTER — Encounter: Payer: Self-pay | Admitting: Internal Medicine

## 2018-09-04 DIAGNOSIS — I25710 Atherosclerosis of autologous vein coronary artery bypass graft(s) with unstable angina pectoris: Secondary | ICD-10-CM

## 2018-09-04 DIAGNOSIS — I200 Unstable angina: Secondary | ICD-10-CM

## 2018-09-04 HISTORY — PX: LEFT HEART CATH AND CORS/GRAFTS ANGIOGRAPHY: CATH118250

## 2018-09-04 HISTORY — PX: CORONARY STENT INTERVENTION: CATH118234

## 2018-09-04 LAB — BASIC METABOLIC PANEL
ANION GAP: 9 (ref 5–15)
BUN: 19 mg/dL (ref 8–23)
CALCIUM: 8.7 mg/dL — AB (ref 8.9–10.3)
CO2: 28 mmol/L (ref 22–32)
Chloride: 103 mmol/L (ref 98–111)
Creatinine, Ser: 1.19 mg/dL (ref 0.61–1.24)
GFR calc Af Amer: >60 mL/min (ref >60)
GFR calc non Af Amer: 59 mL/min — ABNORMAL LOW (ref >60)
GLUCOSE: 121 mg/dL — AB (ref 70–99)
Potassium: 3.4 mmol/L — ABNORMAL LOW (ref 3.5–5.1)
Sodium: 140 mmol/L (ref 135–145)

## 2018-09-04 LAB — GLUCOSE, CAPILLARY
GLUCOSE-CAPILLARY: 106 mg/dL — AB (ref 70–99)
GLUCOSE-CAPILLARY: 188 mg/dL — AB (ref 70–99)
Glucose-Capillary: 109 mg/dL — ABNORMAL HIGH (ref 70–99)
Glucose-Capillary: 96 mg/dL (ref 70–99)
Glucose-Capillary: 141 mg/dL — ABNORMAL HIGH (ref 70–99)

## 2018-09-04 LAB — POCT ACTIVATED CLOTTING TIME: Activated Clotting Time: 406 seconds

## 2018-09-04 SURGERY — LEFT HEART CATH AND CORS/GRAFTS ANGIOGRAPHY
Anesthesia: Moderate Sedation

## 2018-09-04 MED ORDER — MIDAZOLAM HCL 2 MG/2ML IJ SOLN
INTRAMUSCULAR | Status: AC
  Filled 2018-09-04: qty 2

## 2018-09-04 MED ORDER — SODIUM CHLORIDE 0.9 % IV SOLN: 250.0000 mL | INTRAVENOUS | Status: DC | PRN

## 2018-09-04 MED ORDER — SODIUM CHLORIDE 0.9 % WEIGHT BASED INFUSION: 3.0000 mL/kg/h | INTRAVENOUS | Status: DC

## 2018-09-04 MED ORDER — SODIUM CHLORIDE 0.9% FLUSH
3.0000 mL | Freq: Two times a day (BID) | INTRAVENOUS | Status: DC
  Administered 2018-09-04 – 2018-09-05 (×2): 3 mL via INTRAVENOUS

## 2018-09-04 MED ORDER — BIVALIRUDIN TRIFLUOROACETATE 250 MG IV SOLR
INTRAVENOUS | Status: AC
  Filled 2018-09-04: qty 250

## 2018-09-04 MED ORDER — SODIUM CHLORIDE 0.9 % IV SOLN
INTRAVENOUS | Status: DC | PRN
  Administered 2018-09-04: 1.75 mg/kg/h via INTRAVENOUS

## 2018-09-04 MED ORDER — IOPAMIDOL (ISOVUE-300) INJECTION 61%
INTRAVENOUS | Status: DC | PRN
  Administered 2018-09-04: 110 mL via INTRA_ARTERIAL
  Administered 2018-09-04: 155 mL via INTRA_ARTERIAL

## 2018-09-04 MED ORDER — SODIUM CHLORIDE 0.9% FLUSH: 3.0000 mL | Freq: Two times a day (BID) | INTRAVENOUS | Status: DC

## 2018-09-04 MED ORDER — ASPIRIN 81 MG PO CHEW
CHEWABLE_TABLET | ORAL | Status: AC
  Administered 2018-09-04: 81 mg
  Filled 2018-09-04: qty 1

## 2018-09-04 MED ORDER — FENTANYL CITRATE (PF) 100 MCG/2ML IJ SOLN
INTRAMUSCULAR | Status: DC | PRN
  Administered 2018-09-04 (×2): 25 ug via INTRAVENOUS

## 2018-09-04 MED ORDER — CLOPIDOGREL BISULFATE 75 MG PO TABS
ORAL_TABLET | ORAL | Status: AC
  Filled 2018-09-04: qty 8

## 2018-09-04 MED ORDER — SODIUM CHLORIDE 0.9 % WEIGHT BASED INFUSION: 1.0000 mL/kg/h | INTRAVENOUS | Status: DC

## 2018-09-04 MED ORDER — FENTANYL CITRATE (PF) 100 MCG/2ML IJ SOLN
INTRAMUSCULAR | Status: DC | PRN
  Administered 2018-09-04: 25 ug via INTRAVENOUS

## 2018-09-04 MED ORDER — ASPIRIN 81 MG PO CHEW: 81.0000 mg | CHEWABLE_TABLET | ORAL | Status: DC

## 2018-09-04 MED ORDER — ASPIRIN 81 MG PO CHEW
CHEWABLE_TABLET | ORAL | Status: AC
  Filled 2018-09-04: qty 3

## 2018-09-04 MED ORDER — ASPIRIN 81 MG PO CHEW
CHEWABLE_TABLET | ORAL | Status: DC | PRN
  Administered 2018-09-04: 243 mg via ORAL

## 2018-09-04 MED ORDER — ASPIRIN 81 MG PO CHEW
81.0000 mg | CHEWABLE_TABLET | Freq: Every day | ORAL | Status: DC
  Administered 2018-09-05: 81 mg via ORAL
  Filled 2018-09-04: qty 1

## 2018-09-04 MED ORDER — SODIUM CHLORIDE 0.9% FLUSH: 3.0000 mL | INTRAVENOUS | Status: DC | PRN

## 2018-09-04 MED ORDER — MIDAZOLAM HCL 2 MG/2ML IJ SOLN
INTRAMUSCULAR | Status: DC | PRN
  Administered 2018-09-04 (×2): 1 mg via INTRAVENOUS

## 2018-09-04 MED ORDER — BIVALIRUDIN BOLUS VIA INFUSION - CUPID
INTRAVENOUS | Status: DC | PRN
  Administered 2018-09-04: 81 mg via INTRAVENOUS

## 2018-09-04 MED ORDER — NITROGLYCERIN 5 MG/ML IV SOLN
INTRAVENOUS | Status: AC
  Filled 2018-09-04: qty 10

## 2018-09-04 MED ORDER — POTASSIUM CHLORIDE CRYS ER 20 MEQ PO TBCR
40.0000 meq | EXTENDED_RELEASE_TABLET | Freq: Once | ORAL | Status: AC
  Administered 2018-09-04: 40 meq via ORAL
  Filled 2018-09-04: qty 2

## 2018-09-04 MED ORDER — HEPARIN (PORCINE) IN NACL 1000-0.9 UT/500ML-% IV SOLN
INTRAVENOUS | Status: AC
  Filled 2018-09-04: qty 1000

## 2018-09-04 MED ORDER — CLOPIDOGREL BISULFATE 75 MG PO TABS
ORAL_TABLET | ORAL | Status: DC | PRN
  Administered 2018-09-04: 300 mg via ORAL

## 2018-09-04 MED ORDER — FENTANYL CITRATE (PF) 100 MCG/2ML IJ SOLN
INTRAMUSCULAR | Status: AC
  Filled 2018-09-04: qty 2

## 2018-09-04 MED ORDER — MIDAZOLAM HCL 2 MG/2ML IJ SOLN
INTRAMUSCULAR | Status: DC | PRN
  Administered 2018-09-04: 1 mg via INTRAVENOUS

## 2018-09-04 SURGICAL SUPPLY — 19 items
BALLN WOLVERINE 3.50X10 (BALLOONS) ×3
BALLN ~~LOC~~ TREK RX 3.5X12 (BALLOONS) ×3
BALLOON WOLVERINE 3.50X10 (BALLOONS) ×1 IMPLANT
BALLOON ~~LOC~~ TREK RX 3.5X12 (BALLOONS) ×1 IMPLANT
CATH INFINITI 5FR ANG PIGTAIL (CATHETERS) ×3 IMPLANT
CATH INFINITI 5FR JL4 (CATHETERS) ×3 IMPLANT
CATH INFINITI JR4 5F (CATHETERS) ×3 IMPLANT
CATH VISTA GUIDE 6FR JR4 (CATHETERS) ×3 IMPLANT
DEVICE CLOSURE MYNXGRIP 6/7F (Vascular Products) ×3 IMPLANT
DEVICE INFLAT 30 PLUS (MISCELLANEOUS) ×3 IMPLANT
DEVICE SAFEGUARD 24CM (GAUZE/BANDAGES/DRESSINGS) ×3 IMPLANT
KIT MANI 3VAL PERCEP (MISCELLANEOUS) ×3 IMPLANT
NEEDLE PERC 18GX7CM (NEEDLE) ×3 IMPLANT
PACK CARDIAC CATH (CUSTOM PROCEDURE TRAY) ×3 IMPLANT
SHEATH AVANTI 5FR X 11CM (SHEATH) ×3 IMPLANT
SHEATH AVANTI 6FR X 11CM (SHEATH) ×3 IMPLANT
STENT RESOLUTE ONYX 3.5X15 (Permanent Stent) ×3 IMPLANT
WIRE GUIDERIGHT .035X150 (WIRE) ×3 IMPLANT
WIRE RUNTHROUGH .014X180CM (WIRE) ×3 IMPLANT

## 2018-09-04 NOTE — Progress Notes (Signed)
[facility] Encounter Note  Patient: [name] / Admit Date: 09/02/2018 / Date of Encounter: 09/04/2018, 8:35 AM   Subjective: Patient has done fairly well overnight.  No evidence of chest pain this morning.  Heart failure significantly improved after medication management. Cardiac catheterization showing severe LV systolic dysfunction with ejection fraction of 15 to 20% with dilation and likely partially causing symptoms of above Occluded native coronary arteries Patent graft to left anterior descending artery, obtuse marginal artery, diagonal artery, and right coronary artery Significant and stenosis in stent of ostium of graft to right coronary artery oblique causing symptoms of above  Review of Systems: Positive for: Jaquelyn Bitter of breath Negative for: Vision change, hearing change, syncope, dizziness, nausea, vomiting,diarrhea, bloody stool, stomach pain, cough, congestion, diaphoresis, urinary frequency, urinary pain,skin lesions, skin rashes Others previously listed  Objective: Telemetry: This rhythm Physical Exam: Blood pressure (!) 101/44, pulse 68, temperature 97.8 F (36.6 C), temperature source Oral, resp. rate (!) 21, height 5\' 8"  (1.727 m), weight 108 kg, SpO2 98 %. Body mass index is 36.19 kg/m. General: Well developed, well nourished, in no acute distress. Head: Normocephalic, atraumatic, sclera non-icteric, no xanthomas, nares are without discharge. Neck: No apparent masses Lungs: Normal respirations with no wheezes, no rhonchi, no rales , no crackles   Heart: Regular rate and rhythm, normal S1 S2, no murmur, no rub, no gallop, PMI is normal size and placement, carotid upstroke normal without bruit, jugular venous pressure normal Abdomen: Soft, non-tender, non-distended with normoactive bowel sounds. No hepatosplenomegaly. Abdominal aorta is normal size without bruit Extremities: No edema, no clubbing, no cyanosis, no ulcers,  Peripheral: 2+  radial, 2+ femoral, 2+ dorsal pedal pulses Neuro: Alert and oriented. Moves all extremities spontaneously. Psych:  Responds to questions appropriately with a normal affect.   Intake/Output Summary (Last 24 hours) at 09/04/2018 0835 Last data filed at 09/03/2018 1926 Gross per 24 hour  Intake 480 ml  Output 1050 ml  Net -570 ml    Inpatient Medications:  . [MAR Hold] allopurinol  100 mg Oral Daily  . [START ON 09/05/2018] aspirin  81 mg Oral Pre-Cath  . [MAR Hold] atorvastatin  80 mg Oral QPM  . [MAR Hold] carvedilol  3.125 mg Oral BID WC  . [MAR Hold] clopidogrel  75 mg Oral BH-q7a  . [MAR Hold] docusate sodium  100 mg Oral BID  . [MAR Hold] ferrous sulfate  325 mg Oral BID WC  . [MAR Hold] FLUoxetine  20 mg Oral Daily  . [MAR Hold] fluticasone furoate-vilanterol  1 puff Inhalation Daily  . [MAR Hold] furosemide  20 mg Intravenous BID  . [MAR Hold] gabapentin  300 mg Oral BID  . [MAR Hold] heparin  5,000 Units Subcutaneous Q8H  . [MAR Hold] insulin aspart  0-15 Units Subcutaneous TID WC  . [MAR Hold] insulin aspart  0-5 Units Subcutaneous QHS  . [MAR Hold] insulin glargine  40 Units Subcutaneous QHS  . [MAR Hold] loratadine  10 mg Oral Daily  . [MAR Hold] magnesium oxide  400 mg Oral BID  . [MAR Hold] multivitamin with minerals  1 tablet Oral BH-q7a  . [MAR Hold] multivitamin-lutein  1 capsule Oral BID  . [MAR Hold] pantoprazole  40 mg Oral Daily  . [MAR Hold] potassium chloride  40 mEq Oral Once  . [MAR Hold] ramipril  2.5 mg Oral Daily  . [MAR Hold] ranolazine  500 mg Oral BID  . sodium chloride flush  3 mL Intravenous Q12H  . [  MAR Hold] sodium chloride flush  3 mL Intravenous Q12H  . [MAR Hold] spironolactone  25 mg Oral Daily  . [MAR Hold] tamsulosin  0.4 mg Oral QHS  . [MAR Hold] tiotropium  18 mcg Inhalation Daily   Infusions:  . sodium chloride    . [START ON 09/05/2018] sodium chloride     Followed by  . [START ON 09/05/2018] sodium chloride      Labs: Recent  Labs    09/03/18 0545 09/04/18 0526  NA 140 140  K 3.2* 3.4*  CL 103 103  CO2 28 28  GLUCOSE 160* 121*  BUN 17 19  CREATININE 1.23 1.19  CALCIUM 8.4* 8.7*   No results for input(s): AST, ALT, ALKPHOS, BILITOT, PROT, ALBUMIN in the last 72 hours. Recent Labs    09/02/18 1939 09/03/18 0545  WBC 9.1 7.4  HGB 10.9* 9.9*  HCT 31.3* 28.3*  MCV 91.2 90.6  PLT 205 181   Recent Labs    09/02/18 1939 09/03/18 0545  TROPONINI 0.06* 0.06*   Invalid input(s): POCBNP No results for input(s): HGBA1C in the last 72 hours.   Weights: Filed Weights   09/03/18 0353 09/04/18 0530 09/04/18 0713  Weight: 107.9 kg 107.9 kg 108 kg     Radiology/Studies:  Dg Chest 2 View  Result Date: 09/02/2018 CLINICAL DATA:  Chest pain EXAM: CHEST - 2 VIEW COMPARISON:  06/06/2018 chest radiograph. FINDINGS: Intact sternotomy wires. Three lead left subclavian ICD is stable in configuration. Stable cardiomediastinal silhouette with mild cardiomegaly. No pneumothorax. Small bilateral pleural effusions. Mild pulmonary edema. IMPRESSION: 1. Mild congestive heart failure. 2. Small bilateral pleural effusions. Electronically Signed   By: Ilona Sorrel M.D.   On: 09/02/2018 20:19     Assessment and Recommendation  72 y.o. male with the known severe dilated cardiomyopathy coronary artery disease status post coronary bypass graft with recurrent episodes of acute on chronic systolic dysfunction heart failure and minimal elevation of troponin consistent with demand ischemia and worsening stenosis of stent and ostium of graft of right coronary artery 1.  PCI of in-stent stenosis of graft of right coronary artery 2.  Continue dual antiplatelet therapy for demand ischemia 3.  High intensity cholesterol therapy 4.  Beta-blocker ACE inhibitor furosemide for congestive heart failure 5.  Continue cardiac rehabilitation 6.  Continue low-sodium diet  Signed, [name] M.D. FACC

## 2018-09-04 NOTE — Plan of Care (Signed)
Patient has no complaints of chest pain. Educated about cardiac catherization.

## 2018-09-04 NOTE — Progress Notes (Signed)
Patient requested that the bed alarm to be turned off because wife is staying with him at the bedside. She will be helping him. He was educated on why the alarm was turned on.

## 2018-09-04 NOTE — Progress Notes (Signed)
[facility] [place] NAME: [name]    MR#:  [id number]  DATE OF BIRTH:  1946-03-26  SUBJECTIVE:   Doing well. No active chest pain. No issues after cath today. Shortness of breath has improved. No lower extremity edema.  REVIEW OF SYSTEMS:  Review of Systems  Constitutional: Negative for chills, fever and malaise/fatigue.  HENT: Negative for sore throat.   Eyes: Negative for blurred vision and double vision.  Respiratory: Positive for cough. Negative for hemoptysis, sputum production, shortness of breath, wheezing and stridor.   Cardiovascular: Negative for chest pain, palpitations, orthopnea and leg swelling.  Gastrointestinal: Negative for abdominal pain, blood in stool, diarrhea, melena, nausea and vomiting.  Genitourinary: Negative for dysuria, flank pain and hematuria.  Musculoskeletal: Negative for back pain and joint pain.  Skin: Negative for rash.  Neurological: Negative for dizziness, sensory change, focal weakness, seizures, loss of consciousness, weakness and headaches.  Endo/Heme/Allergies: Negative for polydipsia.  Psychiatric/Behavioral: Negative for depression. The patient is not nervous/anxious.     DRUG ALLERGIES:   Allergies  Allergen Reactions  . Tuberculin Tests Rash  . Benadryl [Diphenhydramine] Other (See Comments)    " Hyperactivity"  . Doxycycline Swelling    Pt went into pulmonary edema.  . Lopid [Gemfibrozil] Swelling    "I gain 1 pound a day for 30 days."   VITALS:  Blood pressure (!) 109/46, pulse 68, temperature 97.8 F (36.6 C), temperature source Oral, resp. rate 16, height 5\' 8"  (1.727 m), weight 108 kg, SpO2 94 %. PHYSICAL EXAMINATION:  Physical Exam  Constitutional: He is oriented to person, place, and time. No distress.  Obesity.  HENT:  Head: Normocephalic.  Mouth/Throat: Oropharynx is clear and moist.  Eyes: Pupils are equal, round, and reactive to light. Conjunctivae and EOM are  normal. No scleral icterus.  Neck: Normal range of motion. Neck supple. No JVD present. No tracheal deviation present.  Cardiovascular: Normal rate, regular rhythm and normal heart sounds. Exam reveals no gallop.  No murmur heard. Pulmonary/Chest: Effort normal. No stridor. No respiratory distress. He has no wheezes. He has no rales.  [place] in place  Abdominal: Soft. Bowel sounds are normal. He exhibits no distension. There is no tenderness. There is no rebound.  Musculoskeletal: Normal range of motion. He exhibits no edema or tenderness.  Neurological: He is alert and oriented to person, place, and time. No cranial nerve deficit.  Skin: No rash noted. No erythema.   LABORATORY PANEL:  Male CBC Recent Labs  Lab 09/03/18 0545  WBC 7.4  HGB 9.9*  HCT 28.3*  PLT 181   ------------------------------------------------------------------------------------------------------------------ Chemistries  Recent Labs  Lab 09/04/18 0526  NA 140  K 3.4*  CL 103  CO2 28  GLUCOSE 121*  BUN 19  CREATININE 1.19  CALCIUM 8.7*   RADIOLOGY:  No results found. ASSESSMENT AND PLAN:  [name]  is a 72 y.o. male with a known history of DM2, CHF, COPD, on home oxygen at night, OSA, on CPAP.  Most recent 2D echo, from March 2019 shows ejection fraction at 20-25%. Proceed to cardiac catheterization to assess coronary anatomy and further treatment thereof is necessary per Dr. Nehemiah Massed.  Acute on chronic systolic CHF- most recent ECHO with EF  20-25%. On 2L O2 here (uses 2L intermittently at home). - continue lasix 20mg  IV bid - continue coreg and ramipril  Elevated troponin/CAD- no active chest pain - s/p cardiac cath today with in-stent restenosis, new  DES placed - continue aspirin and plavix, needs long term DAPT per cards - continue lipitor  Diabetes type 2- blood sugars well-controlled - continue lantus and sliding scale.    CKD 3- Cr at baseline  - continue to monitor kidney function  closely - avoid nephrotoxic medications.  Obstructive sleep apnea- stable - continue CPAP at night.  All the records are reviewed and case discussed with Care Management/Social Worker. Management plans discussed with the patient, his wife and they are in agreement.  CODE STATUS: Full Code  TOTAL TIME TAKING CARE OF THIS PATIENT: 35 minutes.   More than 50% of the time was spent in counseling/coordination of care: YES  POSSIBLE D/C IN 21- DAYS, DEPENDING ON CLINICAL CONDITION.   [name] M.D on 09/04/2018 at 3:43 PM  Between 7am to 6pm - Pager - [phone]  After 6pm go to [email or web address] - [facility]

## 2018-09-04 NOTE — Plan of Care (Signed)
  Problem: Education: Goal: Knowledge of General Education information will improve Description Including pain rating scale, medication(s)/side effects and non-pharmacologic comfort measures Outcome: Progressing   Problem: Health Behavior/Discharge Planning: Goal: Ability to manage health-related needs will improve Outcome: Progressing   Problem: Clinical Measurements: Goal: Ability to maintain clinical measurements within normal limits will improve Outcome: Progressing Goal: Will remain free from infection Outcome: Progressing Goal: Respiratory complications will improve Outcome: Progressing Goal: Cardiovascular complication will be avoided Outcome: Progressing   Problem: Nutrition: Goal: Adequate nutrition will be maintained Outcome: Progressing   Problem: Elimination: Goal: Will not experience complications related to bowel motility Outcome: Progressing Goal: Will not experience complications related to urinary retention Outcome: Progressing   Problem: Safety: Goal: Ability to remain free from injury will improve Outcome: Progressing   Problem: Education: Goal: Individualized Educational Video(s) Outcome: Progressing   Problem: Activity: Goal: Ability to return to baseline activity level will improve Outcome: Progressing   Problem: Cardiovascular: Goal: Ability to achieve and maintain adequate cardiovascular perfusion will improve Outcome: Progressing Goal: Vascular access site(s) Level 0-1 will be maintained Outcome: Progressing

## 2018-09-05 DIAGNOSIS — Z955 Presence of coronary angioplasty implant and graft: Secondary | ICD-10-CM

## 2018-09-05 LAB — BASIC METABOLIC PANEL
Anion gap: 7 (ref 5–15)
BUN: 17 mg/dL (ref 8–23)
CHLORIDE: 106 mmol/L (ref 98–111)
CO2: 27 mmol/L (ref 22–32)
CREATININE: 1.12 mg/dL (ref 0.61–1.24)
Calcium: 8.7 mg/dL — ABNORMAL LOW (ref 8.9–10.3)
GFR calc non Af Amer: >60 mL/min (ref >60)
GLUCOSE: 100 mg/dL — AB (ref 70–99)
POTASSIUM: 3.9 mmol/L (ref 3.5–5.1)
SODIUM: 140 mmol/L (ref 135–145)

## 2018-09-05 LAB — CBC
HEMATOCRIT: 27.7 % — AB (ref 40.0–52.0)
Hemoglobin: 9.8 g/dL — ABNORMAL LOW (ref 13.0–18.0)
MCH: 32.6 pg (ref 26.0–34.0)
MCHC: 35.4 g/dL (ref 32.0–36.0)
MCV: 92.1 fL (ref 80.0–100.0)
PLATELETS: 176 10*3/uL (ref 150–440)
RBC: 3.01 MIL/uL — ABNORMAL LOW (ref 4.40–5.90)
RDW: 14.7 % — AB (ref 11.5–14.5)
WBC: 7.9 10*3/uL (ref 3.8–10.6)

## 2018-09-05 LAB — GLUCOSE, CAPILLARY
GLUCOSE-CAPILLARY: 93 mg/dL (ref 70–99)
Glucose-Capillary: 156 mg/dL — ABNORMAL HIGH (ref 70–99)

## 2018-09-05 MED ORDER — TICAGRELOR 90 MG PO TABS: 90.0000 mg | ORAL_TABLET | Freq: Two times a day (BID) | ORAL | Status: DC

## 2018-09-05 MED ORDER — FUROSEMIDE 10 MG/ML IJ SOLN: 40.0000 mg | Freq: Two times a day (BID) | INTRAMUSCULAR | Status: DC

## 2018-09-05 MED ORDER — TICAGRELOR 90 MG PO TABS: 90.0000 mg | ORAL_TABLET | Freq: Two times a day (BID) | ORAL | 0 refills | Status: DC

## 2018-09-05 MED ORDER — FUROSEMIDE 10 MG/ML IJ SOLN
40.0000 mg | Freq: Two times a day (BID) | INTRAMUSCULAR | Status: DC
  Administered 2018-09-05: 40 mg via INTRAVENOUS
  Filled 2018-09-05: qty 4

## 2018-09-05 MED ORDER — ASPIRIN 81 MG PO CHEW: 81.0000 mg | CHEWABLE_TABLET | Freq: Every day | ORAL | 0 refills | Status: DC

## 2018-09-05 NOTE — Discharge Summary (Signed)
[facility] at [place] NAME: [name]    MR#:  [id number]  DATE OF BIRTH:  1946/06/09  DATE OF ADMISSION:  09/02/2018   ADMITTING PHYSICIAN: Amelia Jo, MD  DATE OF DISCHARGE: 09/05/2018  4:03 PM  PRIMARY CARE PHYSICIAN: [name], MD   ADMISSION DIAGNOSIS:  Acute pulmonary edema ([place]) [J81.0] DISCHARGE DIAGNOSIS:  Active Problems:   Acute respiratory failure ([place])   Unstable angina ([place])  SECONDARY DIAGNOSIS:   Past Medical History:  Diagnosis Date  . Anemia   . Cancer ([place]) 12/2013   prostate  . Cardiogenic pulmonary edema ([place]) 12/19/2014  . Cardiomyopathy, ischemic   . CHF (congestive heart failure) ([place])   . COPD (chronic obstructive pulmonary disease) ([place])   . Diabetes mellitus without complication ([place])   . GERD (gastroesophageal reflux disease)   . Hypercholesteremia   . Hypertension   . Myocardial infarction ([place]) [id number]  . Shortness of breath dyspnea    HOSPITAL COURSE:   Naresh is a 72 year old male who presented to the ED with intermittent chest pain and shortness of breath. He was felt to have an acute exacerbation of chronic systolic CHF. He was started on lasix and was admitted for further management.  Chest pain with in-stent stenosis- history of CAD with stent - underwent cardiac cath 9/30 which showed in-stent restenosis, new DES placed - plavix was stopped and patient was started on brilinta (in addition to aspirin), needs long term DAPT per cards - continued lipitor  Acute on chronic systolic CHF- most recent ECHO with EF 20-25%.  - initially on 2L O2, but was on room air on the day of discharge - treated with IV lasix and then transitioned back to home torsemide on discharge - continued coreg and ramipril  Diabetes type 2- blood sugars well-controlled - treated with lantus and sliding scale.    CKD 3- Cr at baseline  Obstructive sleep apnea- stable - CPAP at  night.  DISCHARGE CONDITIONS:  Chronic systolic CHF CAD s/p DES x 3 Type 2 diabetes CKD 3 OSA CONSULTS OBTAINED:  Treatment Team:  [name], MD DRUG ALLERGIES:   Allergies  Allergen Reactions  . Tuberculin Tests Rash  . Benadryl [Diphenhydramine] Other (See Comments)    " Hyperactivity"  . Doxycycline Swelling    Pt went into pulmonary edema.  . Lopid [Gemfibrozil] Swelling    "I gain 1 pound a day for 30 days."   DISCHARGE MEDICATIONS:   Allergies as of 09/05/2018      Reactions   Tuberculin Tests Rash   Benadryl [diphenhydramine] Other (See Comments)   " Hyperactivity"   Doxycycline Swelling   Pt went into pulmonary edema.   Lopid [gemfibrozil] Swelling   "I gain 1 pound a day for 30 days."      Medication List    STOP taking these medications   clopidogrel 75 MG tablet Commonly known as:  PLAVIX     TAKE these medications   acetaminophen 500 MG tablet Commonly known as:  TYLENOL Take 1,000 mg by mouth daily as needed for moderate pain.   albuterol 108 (90 Base) MCG/ACT inhaler Commonly known as:  PROVENTIL HFA;VENTOLIN HFA Inhale 2 puffs into the lungs every 6 (six) hours as needed for wheezing or shortness of breath.   allopurinol 100 MG tablet Commonly known as:  ZYLOPRIM Take 100 mg by mouth daily.   aspirin 81 MG chewable tablet Chew 1 tablet (81 mg  total) by mouth daily. Start taking on:  09/06/2018   atorvastatin 80 MG tablet Commonly known as:  LIPITOR Take 1 tablet (80 mg total) by mouth every evening.   budesonide-formoterol 80-4.5 MCG/ACT inhaler Commonly known as:  SYMBICORT Inhale 2 puffs into the lungs 2 (two) times daily.   calcium carbonate 500 MG chewable tablet Commonly known as:  TUMS - dosed in mg elemental calcium Chew 1 tablet by mouth as needed for indigestion or heartburn.   carvedilol 3.125 MG tablet Commonly known as:  COREG Take 1 tablet (3.125 mg total) by mouth 2 (two) times daily with a meal.   CENTRUM  SILVER PO Take 1 tablet by mouth every morning.   PRESERVISION AREDS 2 Caps Take 1 capsule by mouth 2 (two) times daily. For macular degeneration- eye vitamins   co-enzyme Q-10 30 MG capsule Take 30 mg by mouth daily.   ferrous sulfate 325 (65 FE) MG tablet Take 325 mg by mouth 2 (two) times daily with a meal.   FLUoxetine 20 MG capsule Commonly known as:  PROZAC Take 20 mg by mouth daily.   gabapentin 300 MG capsule Commonly known as:  NEURONTIN Take 300 mg by mouth 2 (two) times daily.   insulin glargine 100 UNIT/ML injection Commonly known as:  LANTUS Inject 0.6 mLs (60 Units total) into the skin at bedtime. What changed:  additional instructions   insulin lispro 100 UNIT/ML KiwkPen Commonly known as:  HUMALOG Inject 0-18 Units into the skin 3 (three) times daily with meals. Per sliding scale   lansoprazole 15 MG capsule Commonly known as:  PREVACID Take 30 mg by mouth 2 (two) times daily.   loratadine 10 MG tablet Commonly known as:  CLARITIN Take 10 mg by mouth daily.   Magnesium Oxide 400 (240 Mg) MG Tabs Take 1 tablet (400 mg total) by mouth 2 (two) times daily.   nitroGLYCERIN 0.4 MG SL tablet Commonly known as:  NITROSTAT Place 0.4 mg under the tongue every 5 (five) minutes as needed for chest pain.   ramipril 2.5 MG capsule Commonly known as:  ALTACE Take 2.5 mg by mouth daily.   ranolazine 500 MG 12 hr tablet Commonly known as:  RANEXA Take 1 tablet (500 mg total) by mouth 2 (two) times daily.   spironolactone 25 MG tablet Commonly known as:  ALDACTONE Take 25 mg by mouth daily.   tamsulosin 0.4 MG Caps capsule Commonly known as:  FLOMAX Take 0.4 mg by mouth at bedtime.   ticagrelor 90 MG Tabs tablet Commonly known as:  BRILINTA Take 1 tablet (90 mg total) by mouth 2 (two) times daily. Start taking on:  09/06/2018   tiotropium 18 MCG inhalation capsule Commonly known as:  SPIRIVA Place 1 capsule (18 mcg total) into inhaler and inhale  daily.   torsemide 20 MG tablet Commonly known as:  DEMADEX Take 1 tablet (20 mg total) by mouth 2 (two) times daily. 1 tab po twice a day.  For weight gain of 3 lbs in one day or five lbs in a week then increase to 2 tabs in am and one in afternoon        DISCHARGE INSTRUCTIONS:  1. F/u with PCP in 1-2 weeks 2. F/u with cardiology in 2 weeks 3. Stopped plavix and started brilinta (in addition to aspirin)- needs long term DAPT 4. Restarted home torsemide- monitor volume status DIET:  Cardiac diet DISCHARGE CONDITION:  Stable ACTIVITY:  Activity as tolerated OXYGEN:  Home Oxygen:  No.  Oxygen Delivery: room air DISCHARGE LOCATION:  home   If you experience worsening of your admission symptoms, develop shortness of breath, life threatening emergency, suicidal or homicidal thoughts you must seek medical attention immediately by calling 911 or calling your MD immediately  if symptoms less severe.  You Must read complete instructions/literature along with all the possible adverse reactions/side effects for all the Medicines you take and that have been prescribed to you. Take any new Medicines after you have completely understood and accpet all the possible adverse reactions/side effects.   Please note  You were cared for by a hospitalist during your hospital stay. If you have any questions about your discharge medications or the care you received while you were in the hospital after you are discharged, you can call the unit and asked to speak with the hospitalist on call if the hospitalist that took care of you is not available. Once you are discharged, your primary care physician will handle any further medical issues. Please note that NO REFILLS for any discharge medications will be authorized once you are discharged, as it is imperative that you return to your primary care physician (or establish a relationship with a primary care physician if you do not have one) for your aftercare  needs so that they can reassess your need for medications and monitor your lab values.    On the day of Discharge:  VITAL SIGNS:  Blood pressure (!) 98/56, pulse 81, temperature 98.7 F (37.1 C), resp. rate 18, height 5\' 8"  (1.727 m), weight 107.1 kg, SpO2 92 %. PHYSICAL EXAMINATION:  GENERAL:  72 y.o.-year-old patient lying in the bed with no acute distress.  EYES: Pupils equal, round, reactive to light and accommodation. No scleral icterus. Extraocular muscles intact.  HEENT: Head atraumatic, normocephalic. Oropharynx and nasopharynx clear.  NECK:  Supple, no jugular venous distention. No thyroid enlargement, no tenderness.  LUNGS: Normal breath sounds bilaterally, no wheezing, rales,rhonchi or crepitation. No use of accessory muscles of respiration.  CARDIOVASCULAR: S1, S2 normal. No murmurs, rubs, or gallops.  ABDOMEN: Soft, non-tender, non-distended. Bowel sounds present. No organomegaly or mass.  EXTREMITIES: No pedal edema, cyanosis, or clubbing.  NEUROLOGIC: Cranial nerves II through XII are intact. Muscle strength 5/5 in all extremities. Sensation intact. Gait not checked.  PSYCHIATRIC: The patient is alert and oriented x 3.  SKIN: No obvious rash, lesion, or ulcer.  DATA REVIEW:   CBC Recent Labs  Lab 09/05/18 0449  WBC 7.9  HGB 9.8*  HCT 27.7*  PLT 176    Chemistries  Recent Labs  Lab 09/05/18 0449  NA 140  K 3.9  CL 106  CO2 27  GLUCOSE 100*  BUN 17  CREATININE 1.12  CALCIUM 8.7*     Microbiology Results  Results for orders placed or performed during the hospital encounter of 12/30/17  MRSA PCR Screening     Status: None   Collection Time: 12/30/17  3:43 AM  Result Value Ref Range Status   MRSA by PCR NEGATIVE NEGATIVE Final    Comment:        The GeneXpert MRSA Assay (FDA approved for NASAL specimens only), is one component of a comprehensive MRSA colonization surveillance program. It is not intended to diagnose MRSA infection nor to guide  or monitor treatment for MRSA infections. Performed at [facility], [address], [place], [place] [address]     RADIOLOGY:  No results found.   Management plans discussed with the patient,  family and they are in agreement.  CODE STATUS: Full Code   TOTAL TIME TAKING CARE OF THIS PATIENT: 35 minutes.    Berna Spare Demitris Pokorny M.D on 09/05/2018 at 6:45 PM  Between 7am to 6pm - Pager - [phone]  After 6pm go to [email or web address] - Proofreader  [facility]  Office  [phone]  CC: Primary care physician; [name], MD   Note: This dictation was prepared with Dragon dictation along with smaller phrase technology. Any transcriptional errors that result from this process are unintentional.

## 2018-09-05 NOTE — Care Management Note (Signed)
Case Management Note  Patient Details  Name: JIONNI HELMING MRN: [id number] Date of Birth: 10/30/1946  Subjective/Objective:         Patient is independent from home.  Admitted with acute respiratory failure.  Acute on chronic CHF.  Has a functioning scale at home and weighs every day.  He is active with the heart failure clinic.  Current with PCP.  Changing from aspirin and Plavix to Brilinta.   Pharmacist gave coupon for Brilinta.  Declines any home health services.  He uses CPAP at night.           Action/Plan:Discharging today with no needs.    Expected Discharge Date:  09/05/18               Expected Discharge Plan:  Home/Self Care  In-House Referral:     Discharge planning Services     Post Acute Care Choice:    Choice offered to:     DME Arranged:    DME Agency:     HH Arranged:    HH Agency:     Status of Service:  Completed, signed off  If discussed at [employer] of Stay Meetings, dates discussed:    Additional Comments:  Elza Rafter, RN 09/05/2018, 4:07 PM

## 2018-09-05 NOTE — Care Management Important Message (Signed)
Initial Medicare IM signed. Copy left in patient's room for reference.   

## 2018-09-05 NOTE — Progress Notes (Signed)
CCMD called to report multiple short runs of v. Tach on tele/ 5 beats and 8 beats reported/ pt asymptomatic / MD made aware/ will continue to monitor.

## 2018-09-05 NOTE — Plan of Care (Signed)
  Problem: Education: Goal: Knowledge of General Education information will improve Description Including pain rating scale, medication(s)/side effects and non-pharmacologic comfort measures Outcome: Progressing   Problem: Health Behavior/Discharge Planning: Goal: Ability to manage health-related needs will improve Outcome: Progressing   Problem: Clinical Measurements: Goal: Ability to maintain clinical measurements within normal limits will improve Outcome: Progressing   Problem: Nutrition: Goal: Adequate nutrition will be maintained Outcome: Progressing   Problem: Elimination: Goal: Will not experience complications related to bowel motility Outcome: Progressing   Problem: Safety: Goal: Ability to remain free from injury will improve Outcome: Progressing   Problem: Cardiovascular: Goal: Vascular access site(s) Level 0-1 will be maintained Outcome: Progressing

## 2018-09-05 NOTE — Discharge Instructions (Signed)
It was so nice to meet you during your hospitalization!  You had a cath done that showed that your stent had clotted. The cardiologist placed another stent in the same area. Please STOP taking plavix and START taking aspirin once daily and Brilinta twice a day.  You should continue taking your torsemide at home.  Please makes sure you follow-up with the heart doctor in the next 1-2 weeks.  -Dr. Brett Albino

## 2018-09-05 NOTE — Progress Notes (Signed)
Provided patient with coupon for Brilinta. Explained to stop taking plavix and start brilinta twice a day. Explained reason behind change. Educated pt to taking both brilinta and ASA 81mg  Reviewed heart failure education. Pt weighs himself daily and takes and extra toresmide if he has gained >3lb overnight or 5lb in one week. Also takes an extra if he feels more bloated or SOB, eats something high in [place] at a restaurant. Remind pt to pay attention to nutrition facts on food and to keep fluid intake <2L. All questions answered  Ramond Dial, Pharm.D, BCPS Clinical Pharmacist

## 2018-09-05 NOTE — Progress Notes (Signed)
[facility] Encounter Note  Patient: [name] / Admit Date: 09/02/2018 / Date of Encounter: 09/05/2018, 9:09 AM   Subjective: Patient has done fairly well overnight.  No evidence of chest pain this morning.  Heart failure significantly improved after medication management. Cardiac catheterization showing severe LV systolic dysfunction with ejection fraction of 15 to 20% with dilation and likely partially causing symptoms of above Occluded native coronary arteries Patent graft to left anterior descending artery, obtuse marginal artery, diagonal artery, and right coronary artery Significant and stenosis in stent of ostium of graft to right coronary artery oblique causing symptoms of above  Review of Systems: Positive for:sob Negative for: Vision change, hearing change, syncope, dizziness, nausea, vomiting,diarrhea, bloody stool, stomach pain, cough, congestion, diaphoresis, urinary frequency, urinary pain,skin lesions, skin rashes Others previously listed  Objective: Telemetry: sinus rhythm Physical Exam: Blood pressure (!) 126/51, pulse 71, temperature 98.7 F (37.1 C), resp. rate 18, height 5\' 8"  (1.727 m), weight 107.1 kg, SpO2 96 %. Body mass index is 35.9 kg/m. General: Well developed, well nourished, in no acute distress. Head: Normocephalic, atraumatic, sclera non-icteric, no xanthomas, nares are without discharge. Neck: No apparent masses Lungs: Normal respirations with no wheezes, no rhonchi, no rales , no crackles   Heart: Regular rate and rhythm, normal S1 S2, no murmur, no rub, no gallop, PMI is normal size and placement, carotid upstroke normal without bruit, jugular venous pressure normal Abdomen: Soft, non-tender, non-distended with normoactive bowel sounds. No hepatosplenomegaly. Abdominal aorta is normal size without bruit Extremities: No edema, no clubbing, no cyanosis, no ulcers,  Peripheral: 2+ radial, 2+ femoral, 2+ dorsal pedal  pulses Neuro: Alert and oriented. Moves all extremities spontaneously. Psych:  Responds to questions appropriately with a normal affect.   Intake/Output Summary (Last 24 hours) at 09/05/2018 0909 Last data filed at 09/04/2018 2346 Gross per 24 hour  Intake 267.56 ml  Output 1400 ml  Net -1132.44 ml    Inpatient Medications:  . allopurinol  100 mg Oral Daily  . aspirin  81 mg Oral Daily  . atorvastatin  80 mg Oral QPM  . carvedilol  3.125 mg Oral BID WC  . docusate sodium  100 mg Oral BID  . ferrous sulfate  325 mg Oral BID WC  . FLUoxetine  20 mg Oral Daily  . fluticasone furoate-vilanterol  1 puff Inhalation Daily  . furosemide  40 mg Intravenous BID  . gabapentin  300 mg Oral BID  . heparin  5,000 Units Subcutaneous Q8H  . insulin aspart  0-15 Units Subcutaneous TID WC  . insulin aspart  0-5 Units Subcutaneous QHS  . insulin glargine  40 Units Subcutaneous QHS  . loratadine  10 mg Oral Daily  . magnesium oxide  400 mg Oral BID  . multivitamin with minerals  1 tablet Oral [id number]  . multivitamin-lutein  1 capsule Oral BID  . pantoprazole  40 mg Oral Daily  . ramipril  2.5 mg Oral Daily  . ranolazine  500 mg Oral BID  . sodium chloride flush  3 mL Intravenous Q12H  . sodium chloride flush  3 mL Intravenous Q12H  . spironolactone  25 mg Oral Daily  . tamsulosin  0.4 mg Oral QHS  . ticagrelor  90 mg Oral BID  . tiotropium  18 mcg Inhalation Daily   Infusions:  . sodium chloride      Labs: Recent Labs    09/04/18 0526 09/05/18 0449  NA 140 140  K 3.4*  3.9  CL 103 106  CO2 28 27  GLUCOSE 121* 100*  BUN 19 17  CREATININE 1.19 1.12  CALCIUM 8.7* 8.7*   No results for input(s): AST, ALT, ALKPHOS, BILITOT, PROT, ALBUMIN in the last 72 hours. Recent Labs    09/03/18 0545 09/05/18 0449  WBC 7.4 7.9  HGB 9.9* 9.8*  HCT 28.3* 27.7*  MCV 90.6 92.1  PLT 181 176   Recent Labs    09/02/18 1939 09/03/18 0545  TROPONINI 0.06* 0.06*   Invalid input(s):  POCBNP No results for input(s): HGBA1C in the last 72 hours.   Weights: Filed Weights   09/04/18 0530 09/04/18 0713 09/05/18 0455  Weight: 107.9 kg 108 kg 107.1 kg     Radiology/Studies:  Dg Chest 2 View  Result Date: 09/02/2018 CLINICAL DATA:  Chest pain EXAM: CHEST - 2 VIEW COMPARISON:  06/06/2018 chest radiograph. FINDINGS: Intact sternotomy wires. Three lead left subclavian ICD is stable in configuration. Stable cardiomediastinal silhouette with mild cardiomegaly. No pneumothorax. Small bilateral pleural effusions. Mild pulmonary edema. IMPRESSION: 1. Mild congestive heart failure. 2. Small bilateral pleural effusions. Electronically Signed   By: Ilona Sorrel M.D.   On: 09/02/2018 20:19     Assessment and Recommendation  72 y.o. male with the known severe dilated cardiomyopathy coronary artery disease status post coronary bypass graft with recurrent episodes of acute on chronic systolic dysfunction heart failure and minimal elevation of troponin consistent with demand ischemia and worsening stenosis of stent and ostium of graft of right coronary artery 1.  PCI of in-stent stenosis of graft of right coronary artery without complication and recovering well 2.  Continue dual antiplatelet therapy for demand ischemia and stent with brilinta and asa 3.  High intensity cholesterol therapy 4.  Beta-blocker ACE inhibitor furosemide for congestive heart failure 5.  Continue cardiac rehabilitation 6.  Continue low-sodium diet 7. Ok for dc to home if ambulating well  Signed, [name] M.D. FACC

## 2018-09-05 NOTE — Progress Notes (Addendum)
Discharge instructions explained to pt and pts wife / verbalized an understanding/ iv and tele removed/ pt ambulated around nursing station / tolerated well/ transported off unit via wheelchair.  

## 2018-09-08 ENCOUNTER — Other Ambulatory Visit: Payer: Self-pay

## 2018-09-08 NOTE — Patient Outreach (Signed)
[facility]) Care Management  [place]   09/08/2018  [name] 11-17-1946 [id number]   3year [facility] medication review. PMHx includes, but not limited to, systolic heart failure with EF 20-25%, hypertension, coronary artery disease, COPD, Type 2 diabetes mellitus and hyperlipidemia.  Successful outreach attempt to [name].  HIPAA identifiers verified.   Subjective: [name] report that he had an episode of pulmonary edema that "snuck up on me."  He states that he weighs himself daily and if he notices a 3 lb/day weight gain, he calls [name] at the heart failure clinic and adjusts his diurectics.  He reports that  increases his daily diuretic dose as needed if he eats something like [place] food.  He states that while hospitalized he had a cardiac cath and his 2012 stent was occulted, so it was replaced and he was started on Brilinta.  He states that he feels like he has little more energy now.  He states that he checks his CBGs daily and they range between (80-130 mg/dL).  He reports that he is trying to lose weight and is "cutting back on eating."  He states that the [place] pays for his medications.   He states he has blood work next week and will see his cardiologist on 10/15.  Objective:  HgA1c 5.9% in 12/18 SCr 1.12mg /dL on 09/05/18  Current Medications: Current Outpatient Medications  Medication Sig Dispense Refill  . acetaminophen (TYLENOL) 500 MG tablet Take 1,000 mg by mouth daily as needed for moderate pain.    Marland Kitchen albuterol (PROVENTIL HFA;VENTOLIN HFA) 108 (90 Base) MCG/ACT inhaler Inhale 2 puffs into the lungs every 6 (six) hours as needed for wheezing or shortness of breath. 1 Inhaler 2  . allopurinol (ZYLOPRIM) 100 MG tablet Take 100 mg by mouth daily.    Marland Kitchen aspirin 81 MG chewable tablet Chew 1 tablet (81 mg total) by mouth daily. 30 tablet 0  . atorvastatin (LIPITOR) 80 MG tablet  Take 1 tablet (80 mg total) by mouth every evening. 30 tablet 3  . budesonide-formoterol (SYMBICORT) 80-4.5 MCG/ACT inhaler Inhale 2 puffs into the lungs 2 (two) times daily.    . calcium carbonate (TUMS - DOSED IN MG ELEMENTAL CALCIUM) 500 MG chewable tablet Chew 1 tablet by mouth as needed for indigestion or heartburn.    . carvedilol (COREG) 3.125 MG tablet Take 1 tablet (3.125 mg total) by mouth 2 (two) times daily with a meal. 60 tablet 2  . co-enzyme Q-10 30 MG capsule Take 30 mg by mouth daily.    . ferrous sulfate 325 (65 FE) MG tablet Take 325 mg by mouth 2 (two) times daily with a meal.    . FLUoxetine (PROZAC) 20 MG capsule Take 20 mg by mouth daily.    Marland Kitchen gabapentin (NEURONTIN) 300 MG capsule Take 300 mg by mouth 2 (two) times daily.     . insulin glargine (LANTUS) 100 UNIT/ML injection Inject 0.6 mLs (60 Units total) into the skin at bedtime. (Patient taking differently: Inject 60 Units into the skin at bedtime. Take 60-80 units at bedtime depending on what he has eaten.) 10 mL 11  . insulin lispro (HUMALOG) 100 UNIT/ML KiwkPen Inject 0-15 Units into the skin 3 (three) times daily with meals. Per sliding scale    . lansoprazole (PREVACID) 15 MG capsule Take 30 mg by mouth 2 (two) times daily.    Marland Kitchen loratadine (CLARITIN) 10 MG tablet  Take 10 mg by mouth daily.    . Magnesium Oxide 400 (240 Mg) MG TABS Take 1 tablet (400 mg total) by mouth 2 (two) times daily. 90 tablet 3  . Multiple Vitamins-Minerals (CENTRUM SILVER PO) Take 1 tablet by mouth every morning.    . Multiple Vitamins-Minerals (PRESERVISION AREDS 2) CAPS Take 1 capsule by mouth 2 (two) times daily. For macular degeneration- eye vitamins    . nitroGLYCERIN (NITROSTAT) 0.4 MG SL tablet Place 0.4 mg under the tongue every 5 (five) minutes as needed for chest pain.    . ramipril (ALTACE) 2.5 MG capsule Take 2.5 mg by mouth daily.    . ranolazine (RANEXA) 500 MG 12 hr tablet Take 1 tablet (500 mg total) by mouth 2 (two) times daily.  180 tablet 3  . spironolactone (ALDACTONE) 25 MG tablet Take 25 mg by mouth daily.     . tamsulosin (FLOMAX) 0.4 MG CAPS capsule Take 0.4 mg by mouth at bedtime.     . ticagrelor (BRILINTA) 90 MG TABS tablet Take 1 tablet (90 mg total) by mouth 2 (two) times daily. 60 tablet 0  . tiotropium (SPIRIVA) 18 MCG inhalation capsule Place 1 capsule (18 mcg total) into inhaler and inhale daily. 30 capsule 0  . torsemide (DEMADEX) 20 MG tablet Take 1 tablet (20 mg total) by mouth 2 (two) times daily. 1 tab po twice a day.  For weight gain of 3 lbs in one day or five lbs in a week then increase to 2 tabs in am and one in afternoon     No current facility-administered medications for this visit.     Functional Status: In your present state of health, do you have any difficulty performing the following activities: 09/03/2018 09/03/2018  Hearing? - N  Vision? - N  Difficulty concentrating or making decisions? - N  Walking or climbing stairs? - N  Dressing or bathing? - N  Doing errands, shopping? N -  Some recent data might be hidden    Fall/Depression Screening: Fall Risk  08/28/2018 07/27/2018 06/29/2018  Falls in the past year? Yes Yes No  Number falls in past yr: 1 1 -  Injury with Fall? No No -  Risk for fall due to : History of fall(s);Impaired balance/gait - -  Risk for fall due to: Comment - - -  Follow up - - -   PHQ 2/9 Scores 08/28/2018 07/27/2018 06/29/2018 05/30/2018 03/21/2018 03/01/2018 02/21/2018  PHQ - 2 Score 0 0 0 0 0 0 0  PHQ- 9 Score - - - - - - -   ASSESSMENT: Date Discharged from Hospital: 09/05/18 Date Medication Reconciliation Performed: 09/08/2018  Medications Discontinued at Discharge:   clopidogrel  New Medications at Discharge:  ticagrelor  Patient was recently discharged from hospital and all medications have been reviewed  Drugs sorted by system:  Neurologic/Psychologic: fluoxetine, gabapentin   Cardiovascular: aspirin, atorvastatin, carvedilol, nitroglycerin,  ramipril, ranolazine, spironolactone, ticagrelor, torsemide  Pulmonary/Allergy: albuterol MDI, budesonide/formoterol, loratadine, tiotropium  Gastrointestinal: calcium carbonate, lansoprazole   Endocrine: insulin glargine, insulin lispro   Pain: acetaminophen   Vitamins/Minerals: co-enzyme Q-10, ferrous sulfate, magnesium oxide, MVI, Perservision Areds  Miscellaneous: allopurinol, tamsulosin  PLAN: Route note to PCP, Dr. Netty Starring.  Joetta Manners, PharmD Clinical Pharmacist [facility] [phone]

## 2018-09-09 ENCOUNTER — Other Ambulatory Visit: Payer: Self-pay

## 2018-09-09 ENCOUNTER — Emergency Department
Admission: EM | Admit: 2018-09-09 | Discharge: 2018-09-10 | Disposition: A | Discharge status: Home / Self Care | Payer: PPO | Attending: Emergency Medicine | Admitting: Emergency Medicine

## 2018-09-09 DIAGNOSIS — I110 Hypertensive heart disease with heart failure: Secondary | ICD-10-CM | POA: Diagnosis not present

## 2018-09-09 DIAGNOSIS — E119 Type 2 diabetes mellitus without complications: Secondary | ICD-10-CM | POA: Insufficient documentation

## 2018-09-09 DIAGNOSIS — I5022 Chronic systolic (congestive) heart failure: Secondary | ICD-10-CM | POA: Diagnosis not present

## 2018-09-09 DIAGNOSIS — Z794 Long term (current) use of insulin: Secondary | ICD-10-CM | POA: Diagnosis not present

## 2018-09-09 DIAGNOSIS — Z79899 Other long term (current) drug therapy: Secondary | ICD-10-CM | POA: Diagnosis not present

## 2018-09-09 DIAGNOSIS — Z87891 Personal history of nicotine dependence: Secondary | ICD-10-CM | POA: Insufficient documentation

## 2018-09-09 DIAGNOSIS — J449 Chronic obstructive pulmonary disease, unspecified: Secondary | ICD-10-CM | POA: Diagnosis not present

## 2018-09-09 DIAGNOSIS — R0602 Shortness of breath: Secondary | ICD-10-CM

## 2018-09-09 DIAGNOSIS — I252 Old myocardial infarction: Secondary | ICD-10-CM | POA: Insufficient documentation

## 2018-09-09 LAB — CBC WITH DIFFERENTIAL/PLATELET
BASOS ABS: 0 10*3/uL (ref 0–0.1)
Basophils Relative: 1 %
EOS PCT: 2 %
Eosinophils Absolute: 0.1 10*3/uL (ref 0–0.7)
HCT: 28.6 % — ABNORMAL LOW (ref 40.0–52.0)
Hemoglobin: 10 g/dL — ABNORMAL LOW (ref 13.0–18.0)
LYMPHS PCT: 11 %
Lymphs Abs: 0.9 10*3/uL — ABNORMAL LOW (ref 1.0–3.6)
MCH: 31.7 pg (ref 26.0–34.0)
MCHC: 35.1 g/dL (ref 32.0–36.0)
MCV: 90.3 fL (ref 80.0–100.0)
Monocytes Absolute: 0.4 10*3/uL (ref 0.2–1.0)
Monocytes Relative: 5 %
NEUTROS ABS: 6.9 10*3/uL — AB (ref 1.4–6.5)
Neutrophils Relative %: 81 %
PLATELETS: 190 10*3/uL (ref 150–440)
RBC: 3.17 MIL/uL — AB (ref 4.40–5.90)
RDW: 15.2 % — ABNORMAL HIGH (ref 11.5–14.5)
WBC: 8.4 10*3/uL (ref 3.8–10.6)

## 2018-09-09 LAB — BASIC METABOLIC PANEL
ANION GAP: 11 (ref 5–15)
BUN: 16 mg/dL (ref 8–23)
CALCIUM: 8.7 mg/dL — AB (ref 8.9–10.3)
CO2: 25 mmol/L (ref 22–32)
CREATININE: 1.23 mg/dL (ref 0.61–1.24)
Chloride: 104 mmol/L (ref 98–111)
GFR calc Af Amer: >60 mL/min (ref >60)
GFR, EST NON AFRICAN AMERICAN: 56 mL/min — AB (ref >60)
Glucose, Bld: 127 mg/dL — ABNORMAL HIGH (ref 70–99)
POTASSIUM: 3.6 mmol/L (ref 3.5–5.1)
Sodium: 140 mmol/L (ref 135–145)

## 2018-09-09 LAB — PROTIME-INR
INR: 1.02
Prothrombin Time: 13.3 seconds (ref 11.4–15.2)

## 2018-09-09 NOTE — Progress Notes (Signed)
Patient ID: [name], male    DOB: 1946-11-06, 72 y.o.   MRN: [id number]  HPI  [name] is a 72 y/o male with a history of prostate cancer, DM, hyperlipidemia, HTN, anemia, COPD, GERD, MI, previous tobacco use and chronic heart failure.   Echo report from 09/03/18 reviewed and showed an EF of 20% along with mild TR. Echo report from 02/18/18 reviewed and showed an EF of 20-25% along with trivial AR and moderate MR. Echo report from 12/05/17 reviewed and showed an EF of 20-25% along with mild MR and moderate MR.   Cardiac catheterization done 09/04/18 showed:  ejection fraction of 15% severe 3 vessel coronary artery disease There is occlusion of all native vessels at the ostium Patent graft to obtuse marginal 2 Patent graft to diagonal artery Patent graft to left anterior descending artery There is significant coronary artery disease and or stenosis involving the graft ostium to the right coronary  Stent placed to ostial SVG to RCA.   Cardiac catheterization done 02/02/16 showed severed 3-vessel CAD with patent grafts. Continue medication management along with dual antiplatelet therapy.   Was in the ED 09/10/18 due to shortness of breath with a normal exam. Released same day. Admitted 09/02/18 due to chest pain along with HF exacerbation. Cardiology consult obtained. Catheterization/ stent done. Initially needed IV lasix and then transitioned to oral diuretics. Discharged after 3 days. Admitted 06/06/18 due to COPD exacerbation. Given IV solu-medrol and nebulizer treatments and then switched or oral prednisone. Spiriva was added to regimen. HF was stable and he was discharged after 2 days. Was in the ED 06/04/18 for shortness of breath where he was treated and released.   He presents today for a follow-up visit with a chief complaint of moderate shortness of breath upon minimal exertion. He describes this as chronic in nature having been present for several years although he does feel like  it has worsened since being started on brilinta a few days ago. He has associated fatigue and easy bruising along with this. He denies any abdominal distention, palpitations, pedal edema, chest pain, cough, dizziness or weight gain  Past Medical History:  Diagnosis Date  . Anemia   . Cancer ([place]) 12/2013   prostate  . Cardiogenic pulmonary edema ([place]) 12/19/2014  . Cardiomyopathy, ischemic   . CHF (congestive heart failure) ([place])   . COPD (chronic obstructive pulmonary disease) ([place])   . Diabetes mellitus without complication ([place])   . GERD (gastroesophageal reflux disease)   . Hypercholesteremia   . Hypertension   . Myocardial infarction ([place]) [id number]  . Shortness of breath dyspnea    Past Surgical History:  Procedure Laterality Date  . CARDIAC CATHETERIZATION N/A 02/02/2016   Procedure: Left Heart Cath and Coronary Angiography;  Surgeon: [name], MD;  Location: [facility];  Service: Cardiovascular;  Laterality: N/A;  . CORONARY ANGIOPLASTY WITH STENT PLACEMENT    . CORONARY ARTERY BYPASS GRAFT  11/24/2010  . CORONARY STENT INTERVENTION N/A 09/04/2018   Procedure: CORONARY STENT INTERVENTION;  Surgeon: [name], MD;  Location: [facility];  Service: Cardiovascular;  Laterality: N/A;  . IMPLANTABLE CARDIOVERTER DEFIBRILLATOR (ICD) GENERATOR CHANGE Left 12/12/2015   Procedure: DUAL LEAD PLACEMENT CARDIAC DIFIBRILLATOR;  Surgeon: [name], MD;  Location: [facility];  Service: Cardiovascular;  Laterality: Left;  . LEFT HEART CATH AND CORS/GRAFTS ANGIOGRAPHY N/A 09/04/2018   Procedure: LEFT HEART CATH AND CORS/GRAFTS ANGIOGRAPHY;  Surgeon: [name], MD;  Location: [facility];  Service: Cardiovascular;  Laterality: N/A;  . TONSILLECTOMY     Family History  Problem Relation Age of Onset  . CAD Mother   . Cancer Mother   . Diabetes Mother   . Alzheimer's disease Father   . Cancer Father   . Heart disease Father    Social  History   Tobacco Use  . Smoking status: Former Smoker    Packs/day: 2.00    Years: 30.00    Pack years: 60.00    Last attempt to quit: 12/03/1996    Years since quitting: 21.7  . Smokeless tobacco: Never Used  Substance Use Topics  . Alcohol use: No    Alcohol/week: 0.0 standard drinks   Allergies  Allergen Reactions  . Tuberculin Tests Rash  . Benadryl [Diphenhydramine] Other (See Comments)    " Hyperactivity"  . Doxycycline Swelling    Pt went into pulmonary edema.  . Lopid [Gemfibrozil] Swelling    "I gain 1 pound a day for 30 days."   Prior to Admission medications   Medication Sig Start Date End Date Taking? Authorizing Provider  acetaminophen (TYLENOL) 500 MG tablet Take 1,000 mg by mouth daily as needed for moderate pain.   Yes [provider]  albuterol (PROVENTIL HFA;VENTOLIN HFA) 108 (90 Base) MCG/ACT inhaler Inhale 2 puffs into the lungs every 6 (six) hours as needed for wheezing or shortness of breath. 06/04/18  Yes [name], [name], MD  allopurinol (ZYLOPRIM) 100 MG tablet Take 100 mg by mouth daily.   Yes [provider]  aspirin 81 MG chewable tablet Chew 1 tablet (81 mg total) by mouth daily. 09/06/18  Yes Mayo, Pete [name], MD  atorvastatin (LIPITOR) 80 MG tablet Take 1 tablet (80 mg total) by mouth every evening. 04/21/17  Yes Theodoro [name], MD  budesonide-formoterol (SYMBICORT) 80-4.5 MCG/ACT inhaler Inhale 2 puffs into the lungs 2 (two) times daily.   Yes [provider]  calcium carbonate (TUMS - DOSED IN MG ELEMENTAL CALCIUM) 500 MG chewable tablet Chew 1 tablet by mouth as needed for indigestion or heartburn.   Yes [provider]  carvedilol (COREG) 3.125 MG tablet Take 1 tablet (3.125 mg total) by mouth 2 (two) times daily with a meal. 01/01/18  Yes Gladstone [name], MD  co-enzyme Q-10 30 MG capsule Take 30 mg by mouth daily.   Yes [provider]  ferrous sulfate 325 (65 FE) MG tablet Take 325 mg by mouth 2  (two) times daily with a meal.   Yes [provider]  FLUoxetine (PROZAC) 20 MG capsule Take 20 mg by mouth daily.   Yes [provider]  gabapentin (NEURONTIN) 300 MG capsule Take 300 mg by mouth 2 (two) times daily.    Yes [provider]  insulin glargine (LANTUS) 100 UNIT/ML injection Inject 0.6 mLs (60 Units total) into the skin at bedtime. Patient taking differently: Inject 60 Units into the skin at bedtime. Take 60-80 units at bedtime depending on what he has eaten. 01/01/18  Yes Gladstone [name], MD  insulin lispro (HUMALOG) 100 UNIT/ML KiwkPen Inject 0-15 Units into the skin 3 (three) times daily with meals. Per sliding scale   Yes [provider]  lansoprazole (PREVACID) 15 MG capsule Take 30 mg by mouth 2 (two) times daily.   Yes [provider]  loratadine (CLARITIN) 10 MG tablet Take 10 mg by mouth daily.   Yes [provider]  Magnesium Oxide 400 (240 Mg) MG TABS  Take 1 tablet (400 mg total) by mouth 2 (two) times daily. 05/30/18  Yes [name], Otila Kluver A, FNP  Multiple Vitamins-Minerals (CENTRUM SILVER PO) Take 1 tablet by mouth every morning.   Yes [provider]  Multiple Vitamins-Minerals (PRESERVISION AREDS 2) CAPS Take 1 capsule by mouth 2 (two) times daily. For macular degeneration- eye vitamins   Yes [provider]  nitroGLYCERIN (NITROSTAT) 0.4 MG SL tablet Place 0.4 mg under the tongue every 5 (five) minutes as needed for chest pain.   Yes [provider]  ramipril (ALTACE) 2.5 MG capsule Take 2.5 mg by mouth daily.   Yes [provider]  ranolazine (RANEXA) 500 MG 12 hr tablet Take 1 tablet (500 mg total) by mouth 2 (two) times daily. 05/30/18  Yes Tauno Falotico, Otila Kluver A, FNP  spironolactone (ALDACTONE) 25 MG tablet Take 25 mg by mouth daily.    Yes [provider]  tamsulosin (FLOMAX) 0.4 MG CAPS capsule Take 0.4 mg by mouth at bedtime.    Yes [provider]  tiotropium  (SPIRIVA) 18 MCG inhalation capsule Place 1 capsule (18 mcg total) into inhaler and inhale daily. 06/08/18  Yes Wieting, Richard, MD  torsemide (DEMADEX) 20 MG tablet Take 1 tablet (20 mg total) by mouth 2 (two) times daily. 1 tab po twice a day.  For weight gain of 3 lbs in one day or five lbs in a week then increase to 2 tabs in am and one in afternoon 06/08/18  Yes Wieting, Richard, MD  ticagrelor (BRILINTA) 90 MG TABS tablet Take 1 tablet (90 mg total) by mouth 2 (two) times daily. Patient not taking: Reported on 09/11/2018 09/06/18   MayoPete [name], MD    Review of Systems  Constitutional: Positive for fatigue. Negative for appetite change.  HENT: Negative for congestion, postnasal drip, sore throat and voice change.   Eyes: Negative.   Respiratory: Positive for shortness of breath (worsening since taking brilinta). Negative for cough and chest tightness.   Cardiovascular: Negative for chest pain, palpitations and leg swelling.  Gastrointestinal: Negative for abdominal distention and abdominal pain.  Endocrine: Negative.   Genitourinary: Negative.   Musculoskeletal: Positive for arthralgias (leg pain when walking long distances).  Skin: Negative.   Allergic/Immunologic: Negative.   Neurological: Negative for dizziness and light-headedness.  Hematological: Negative for adenopathy. Bruises/bleeds easily.  Psychiatric/Behavioral: Negative for dysphoric mood and sleep disturbance (sleeping well with CPAP and oxygen). The patient is not nervous/anxious.    Vitals:   09/11/18 1338  BP: (!) 123/53  Pulse: 77  Resp: 18  SpO2: 97%  Weight: 238 lb 2 oz (108 kg)  Height: 5\' 8"  (1.727 m)   Wt Readings from Last 3 Encounters:  09/11/18 238 lb 2 oz (108 kg)  09/09/18 236 lb 1.8 oz (107.1 kg)  09/05/18 236 lb 1.8 oz (107.1 kg)   Lab Results  Component Value Date   CREATININE 1.23 09/09/2018   CREATININE 1.12 09/05/2018   CREATININE 1.19 09/04/2018    Physical Exam  Constitutional: He is  oriented to person, place, and time. He appears well-developed and well-nourished.  HENT:  Head: Normocephalic and atraumatic.  Neck: Normal range of motion. Neck supple. No JVD present.  Cardiovascular: Normal rate and regular rhythm.  Pulmonary/Chest: Effort normal. He has no wheezes. He has no rales.  Abdominal: He exhibits no distension. There is no tenderness.  Musculoskeletal: He exhibits no edema or tenderness.  Neurological: He is alert and oriented to person, place, and time.  Skin: Skin is warm and dry.  Psychiatric: He has a normal mood and affect. His behavior is normal. Thought content normal.  Nursing note and vitals reviewed.  Assessment & Plan:  1: Chronic heart failure with reduced ejection fraction-  - NYHA class III - euvolemic today - has been weighing daily and home weight chart was reviewed. Reminded to call for an overnight weight gain of 2 pounds or > or a weekly weight gain of >5 pounds - weight down 8 pounds from last visit here 2 weeks ago - not adding salt. Eats at [employer] 7 days a week but most days he takes his own breakfast and goes to socialize. Discussed the importance of closely following a 2000mg  sodium diet and to limit fast food - saw cardiology [name]) 07/18/18 - BNP from 09/02/18 was 381.0  2: HTN- - BP looks good today - saw PCP ([facility]) 06/29/18 - BMP from 09/05/18 reviewed and showed sodium 140, potassium 3.9, creatinine 1.12 and GFR >60   3:Diabetes-  - A1c on 03/21/18 was 6.7% - glucose at home today was 117 - saw endocrinologist ([name]) 03/28/18  4: Stent stenosis- - new DES placed and patient was started on brilinta - he took 1 dose on 10/4 and the next day, his shortness of breath was worse and has been "bad ever since" - he did not take his brilinta this morning - patient's cardiologist, Dr. Nehemiah [name], called and explained symptom with brilinta and Dr. Nehemiah [name] said for patient to not take anymore brilinta and resume his  clopidogrel 75mg  daily along with his aspirin - have added brilinta to his allergy list  Patient's medication list was reviewed.  Return here in 1 month or sooner for any questions/problems before then.

## 2018-09-09 NOTE — ED Triage Notes (Addendum)
Pt arrived via ems from home with difficulty breathing. Pt was just d/c from this facility on Tues with the same complaint. Pt A&Ox4. Pt NAD at present, respirations even and non labored. Pt has hx of pacemaker, COPD, and pulmonary edema.

## 2018-09-10 ENCOUNTER — Emergency Department: Payer: PPO

## 2018-09-10 DIAGNOSIS — R0602 Shortness of breath: Secondary | ICD-10-CM | POA: Diagnosis not present

## 2018-09-10 LAB — BRAIN NATRIURETIC PEPTIDE: B NATRIURETIC PEPTIDE 5: 338 pg/mL — AB (ref 0.0–100.0)

## 2018-09-10 LAB — TROPONIN I

## 2018-09-10 NOTE — Discharge Instructions (Addendum)
Your work-up was quite reassuring today with no evidence of worsening pulmonary edema; in fact your chest x-ray looks better today than it did last week.  Please continue taking all your medications as planned and follow-up with your providers as scheduled over the next couple of days.  Return to the emergency department if you develop new or worsening symptoms that concern you.

## 2018-09-10 NOTE — ED Provider Notes (Signed)
[facility] Emergency Department Provider Note  ____________________________________________   First MD Initiated Contact with Patient 09/09/18 2358     (approximate)  I have reviewed the triage vital signs and the nursing notes.   HISTORY  Chief Complaint Respiratory Distress    HPI [name] is a 72 y.o. male with medical history as listed below who presents for evaluation of shortness of breath.  He states that he was discharged about a week ago from the hospital after his CHF exacerbation.  He has been compliant with his medications.  He states he had a good day yesterday and had a lot of activity walking around outside.  Today, however, he has felt very tired and has felt increasing shortness of breath particularly with exertion.  He has oxygen that he uses as needed at home.  He has been taking all of his medications and nothing in particular makes his symptoms better and exertion makes them worse.  He describes the shortness of breath is moderate but he was worried because of his recent experience.  He has had no swelling in his legs.  He is able to recline without difficulty.  He denies fever/chills, chest pain, nausea, vomiting, and abdominal pain.  Past Medical History:  Diagnosis Date  . Anemia   . Cancer ([place]) 12/2013   prostate  . Cardiogenic pulmonary edema ([place]) 12/19/2014  . Cardiomyopathy, ischemic   . CHF (congestive heart failure) ([place])   . COPD (chronic obstructive pulmonary disease) ([place])   . Diabetes mellitus without complication ([place])   . GERD (gastroesophageal reflux disease)   . Hypercholesteremia   . Hypertension   . Myocardial infarction ([place]) [id number]  . Shortness of breath dyspnea     Patient Active Problem List   Diagnosis Date Noted  . Unstable angina ([place])   . Acute respiratory failure ([place]) 09/02/2018  . COPD exacerbation ([place]) 06/06/2018  . Acute on chronic systolic CHF (congestive heart failure) ([place])  04/21/2017  . Hypokalemia 04/21/2017  . Hypomagnesemia 04/21/2017  . Essential hypertension 04/21/2017  . Hyperlipidemia 04/21/2017  . Coronary artery disease 04/21/2017  . COPD with chronic bronchitis ([place]) 02/13/2016  . Obstructive sleep apnea 02/13/2016  . Diabetes ([place]) 02/13/2016  . Chronic systolic HF (heart failure) ([place]) 12/12/2015    Past Surgical History:  Procedure Laterality Date  . CARDIAC CATHETERIZATION N/A 02/02/2016   Procedure: Left Heart Cath and Coronary Angiography;  Surgeon: [name], MD;  Location: [facility];  Service: Cardiovascular;  Laterality: N/A;  . CORONARY ANGIOPLASTY WITH STENT PLACEMENT    . CORONARY ARTERY BYPASS GRAFT  11/24/2010  . CORONARY STENT INTERVENTION N/A 09/04/2018   Procedure: CORONARY STENT INTERVENTION;  Surgeon: [name], MD;  Location: [place];  Service: Cardiovascular;  Laterality: N/A;  . IMPLANTABLE CARDIOVERTER DEFIBRILLATOR (ICD) GENERATOR CHANGE Left 12/12/2015   Procedure: DUAL LEAD PLACEMENT CARDIAC DIFIBRILLATOR;  Surgeon: [name], MD;  Location: [facility];  Service: Cardiovascular;  Laterality: Left;  . LEFT HEART CATH AND CORS/GRAFTS ANGIOGRAPHY N/A 09/04/2018   Procedure: LEFT HEART CATH AND CORS/GRAFTS ANGIOGRAPHY;  Surgeon: [name], MD;  Location: [facility];  Service: Cardiovascular;  Laterality: N/A;  . TONSILLECTOMY      Prior to Admission medications   Medication Sig Start Date End Date Taking? Authorizing Provider  acetaminophen (TYLENOL) 500 MG tablet Take 1,000 mg by mouth daily as needed for moderate pain.   Yes [provider]  albuterol (PROVENTIL HFA;VENTOLIN HFA) 108 (90 Base) MCG/ACT inhaler Inhale 2 puffs into the lungs every 6 (six) hours as needed for wheezing or shortness of breath. 06/04/18  Yes [name], [name], MD  allopurinol (ZYLOPRIM) 100 MG tablet Take 100 mg by mouth daily.   Yes [provider]  aspirin 81 MG  chewable tablet Chew 1 tablet (81 mg total) by mouth daily. 09/06/18  Yes Mayo, Pete [name], MD  atorvastatin (LIPITOR) 80 MG tablet Take 1 tablet (80 mg total) by mouth every evening. 04/21/17  Yes Theodoro [name], MD  budesonide-formoterol (SYMBICORT) 80-4.5 MCG/ACT inhaler Inhale 2 puffs into the lungs 2 (two) times daily.   Yes [provider]  calcium carbonate (TUMS - DOSED IN MG ELEMENTAL CALCIUM) 500 MG chewable tablet Chew 1 tablet by mouth as needed for indigestion or heartburn.   Yes [provider]  carvedilol (COREG) 3.125 MG tablet Take 1 tablet (3.125 mg total) by mouth 2 (two) times daily with a meal. 01/01/18  Yes Gladstone [name], MD  co-enzyme Q-10 30 MG capsule Take 30 mg by mouth daily.   Yes [provider]  ferrous sulfate 325 (65 FE) MG tablet Take 325 mg by mouth 2 (two) times daily with a meal.   Yes [provider]  FLUoxetine (PROZAC) 20 MG capsule Take 20 mg by mouth daily.   Yes [provider]  gabapentin (NEURONTIN) 300 MG capsule Take 300 mg by mouth 2 (two) times daily.    Yes [provider]  insulin glargine (LANTUS) 100 UNIT/ML injection Inject 0.6 mLs (60 Units total) into the skin at bedtime. Patient taking differently: Inject 60 Units into the skin at bedtime. Take 60-80 units at bedtime depending on what he has eaten. 01/01/18  Yes Gladstone [name], MD  insulin lispro (HUMALOG) 100 UNIT/ML KiwkPen Inject 0-15 Units into the skin 3 (three) times daily with meals. Per sliding scale   Yes [provider]  lansoprazole (PREVACID) 15 MG capsule Take 30 mg by mouth 2 (two) times daily.   Yes [provider]  loratadine (CLARITIN) 10 MG tablet Take 10 mg by mouth daily.   Yes [provider]  Magnesium Oxide 400 (240 Mg) MG TABS Take 1 tablet (400 mg total) by mouth 2 (two) times daily. 05/30/18  Yes Hack[place], Otila Kluver A, FNP  Multiple Vitamins-Minerals (CENTRUM SILVER PO) Take 1 tablet by mouth  every morning.   Yes [provider]  Multiple Vitamins-Minerals (PRESERVISION AREDS 2) CAPS Take 1 capsule by mouth 2 (two) times daily. For macular degeneration- eye vitamins   Yes [provider]  ramipril (ALTACE) 2.5 MG capsule Take 2.5 mg by mouth daily.   Yes [provider]  ranolazine (RANEXA) 500 MG 12 hr tablet Take 1 tablet (500 mg total) by mouth 2 (two) times daily. 05/30/18  Yes Hackney, Otila Kluver A, FNP  spironolactone (ALDACTONE) 25 MG tablet Take 25 mg by mouth daily.    Yes [provider]  tamsulosin (FLOMAX) 0.4 MG CAPS capsule Take 0.4 mg by mouth at bedtime.    Yes [provider]  ticagrelor (BRILINTA) 90 MG TABS tablet Take 1 tablet (90 mg total) by mouth 2 (two) times daily. 09/06/18  Yes Mayo, Pete [name], MD  tiotropium (SPIRIVA) 18 MCG inhalation capsule Place 1 capsule (18 mcg total) into inhaler and inhale daily. 06/08/18  Yes Wieting, Richard, MD  torsemide (DEMADEX) 20 MG tablet Take 1 tablet (20 mg total) by mouth 2 (two) times daily.  1 tab po twice a day.  For weight gain of 3 lbs in one day or five lbs in a week then increase to 2 tabs in am and one in afternoon 06/08/18  Yes Wieting, Richard, MD  nitroGLYCERIN (NITROSTAT) 0.4 MG SL tablet Place 0.4 mg under the tongue every 5 (five) minutes as needed for chest pain.    [provider]    Allergies Tuberculin tests; Benadryl [diphenhydramine]; Doxycycline; and Lopid [gemfibrozil]  Family History  Problem Relation Age of Onset  . CAD Mother   . Cancer Mother   . Diabetes Mother   . Alzheimer's disease Father   . Cancer Father   . Heart disease Father     Social History Social History   Tobacco Use  . Smoking status: Former Smoker    Packs/day: 2.00    Years: 30.00    Pack years: 60.00    Last attempt to quit: 12/03/1996    Years since quitting: 21.7  . Smokeless tobacco: Never Used  Substance Use Topics  . Alcohol use: No    Alcohol/week: 0.0 standard  drinks  . Drug use: No    Review of Systems Constitutional: No fever/chills Eyes: No visual changes. ENT: No sore throat. Cardiovascular: Denies chest pain. Respiratory: Shortness of breath as described above Gastrointestinal: No abdominal pain.  No nausea, no vomiting.  No diarrhea.  No constipation. Genitourinary: Negative for dysuria. Musculoskeletal: Negative for neck pain.  Negative for back pain. Integumentary: Negative for rash. Neurological: Negative for headaches, focal weakness or numbness.   ____________________________________________   PHYSICAL EXAM:  VITAL SIGNS: ED Triage Vitals  Enc Vitals Group     BP 09/09/18 2315 (!) 127/54     Pulse Rate 09/09/18 2313 85     Resp 09/09/18 2313 16     Temp 09/09/18 2313 98.6 F (37 C)     Temp Source 09/09/18 2313 Oral     SpO2 09/09/18 2312 98 %     Weight 09/09/18 2315 107.1 kg (236 lb 1.8 oz)     Height 09/09/18 2315 1.727 m (5\' 8" )     Head Circumference --      Peak Flow --      Pain Score 09/09/18 2315 0     Pain Loc --      Pain Edu? --      Excl. in [place]? --     Constitutional: Alert and oriented. Well appearing and in no acute distress. Eyes: Conjunctivae are normal.  Head: Atraumatic. Nose: No congestion/rhinnorhea. Mouth/Throat: Mucous membranes are moist. Neck: No stridor.  No meningeal signs.   Cardiovascular: Normal rate, regular rhythm. Good peripheral circulation. Grossly normal heart sounds. Respiratory: Normal respiratory effort.  No retractions. Lungs CTAB. Gastrointestinal: Soft and nontender. No distention.  Musculoskeletal: No lower extremity tenderness nor edema. No gross deformities of extremities. Neurologic:  Normal speech and language. No gross focal neurologic deficits are appreciated.  Skin:  Skin is warm, dry and intact. No rash noted. Psychiatric: Mood and affect are normal. Speech and behavior are normal.  ____________________________________________   LABS (all labs ordered  are listed, but only abnormal results are displayed)  Labs Reviewed  CBC WITH DIFFERENTIAL/PLATELET - Abnormal; Notable for the following components:      Result Value   RBC 3.17 (*)    Hemoglobin 10.0 (*)    HCT 28.6 (*)    RDW 15.2 (*)    Neutro Abs 6.9 (*)    Lymphs Abs 0.9 (*)  All other components within normal limits  BASIC METABOLIC PANEL - Abnormal; Notable for the following components:   Glucose, Bld 127 (*)    Calcium 8.7 (*)    GFR calc non Af Amer 56 (*)    All other components within normal limits  BRAIN NATRIURETIC PEPTIDE - Abnormal; Notable for the following components:   B Natriuretic Peptide 338.0 (*)    All other components within normal limits  PROTIME-INR  TROPONIN I   ____________________________________________  EKG  ED ECG REPORT I, Hinda Kehr, the attending physician, personally viewed and interpreted this ECG.  Date: 09/09/2018 EKG Time: 23: 15 Rate: 81 Rhythm: Atrial sensed ventricular paced rhythm QRS Axis: Atrial sensed ventricular paced rhythm Intervals: Atrial sensed ventricular paced rhythm ST/T Wave abnormalities: Non-specific ST segment / T-wave changes, but no evidence of acute ischemia. Narrative Interpretation: no evidence of acute ischemia   ____________________________________________  RADIOLOGY I, Hinda Kehr, personally viewed and evaluated these images (plain radiographs) as part of my medical decision making, as well as reviewing the written report by the radiologist.  ED MD interpretation: Evidence of improving pulmonary edema compared to prior  Official radiology report(s): Dg Chest 2 View  Result Date: 09/10/2018 CLINICAL DATA:  Shortness of breath.  History of pulmonary edema. EXAM: CHEST - 2 VIEW COMPARISON:  Radiograph 09/02/2018, additional priors FINDINGS: Multi lead left-sided pacemaker in place, intact leads. Post median sternotomy. Unchanged cardiomegaly. Coronary stent or calcifications. Improvement in  pulmonary edema with mild central residual. Small residual pleural effusions, also diminished. No focal airspace disease or pneumothorax. Degenerative change in the spine. IMPRESSION: 1. Improving pulmonary edema and small pleural effusions, mild residual. 2. Unchanged cardiomegaly. Electronically Signed   By: Keith Rake M.D.   On: 09/10/2018 01:09    ____________________________________________   PROCEDURES  Critical Care performed: No   Procedure(s) performed:   Procedures   ____________________________________________   INITIAL IMPRESSION / ASSESSMENT AND PLAN / ED COURSE  As part of my medical decision making, I reviewed the following data within the electronic MEDICAL RECORD NUMBER History obtained from family, Nursing notes reviewed and incorporated, Labs reviewed , EKG interpreted , Old chart reviewed, Radiograph reviewed  and Notes from prior ED visits    Differential diagnosis includes, but is not limited to, overexertion, worsening pulmonary edema in the setting of CHF exacerbation, COPD exacerbation, healthcare associated pneumonia.  The patient is actually quite well-appearing and in no acute distress at this time.  He is in no respiratory distress and has clear lung sounds bilaterally.  I think he was mostly concerned based on his recent experience but his work-up today is reassuring with normal vital signs, and improved BNP, reassuring CBC, negative troponin, and normal basic metabolic panel.  He has no evidence of volume overload on exam.  I provided reassurance that he is comfortable with the plan for outpatient follow-up.  He actually has an appointment scheduled with his heart failure provider within less than 48 hours and with his primary care doctor the day after that.  I encouraged him to continue taking his regular medications and follow-up as planned.  I gave my usual customary return precautions and he and his wife understand and agree with the plan.      ____________________________________________  FINAL CLINICAL IMPRESSION(S) / ED DIAGNOSES  Final diagnoses:  Shortness of breath     MEDICATIONS GIVEN DURING THIS VISIT:  Medications - No data to display   ED Discharge Orders    None  Note:  This document was prepared using Dragon voice recognition software and may include unintentional dictation errors.    Hinda Kehr, MD 09/10/18 [phone]

## 2018-09-11 ENCOUNTER — Encounter: Payer: Self-pay | Admitting: Family

## 2018-09-11 ENCOUNTER — Ambulatory Visit: Payer: PPO | Attending: Family | Admitting: Family

## 2018-09-11 VITALS — BP 123/53 | HR 77 | Resp 18 | Ht 68.0 in | Wt 238.1 lb

## 2018-09-11 DIAGNOSIS — Z7982 Long term (current) use of aspirin: Secondary | ICD-10-CM | POA: Diagnosis not present

## 2018-09-11 DIAGNOSIS — Z87891 Personal history of nicotine dependence: Secondary | ICD-10-CM | POA: Insufficient documentation

## 2018-09-11 DIAGNOSIS — T82855A Stenosis of coronary artery stent, initial encounter: Secondary | ICD-10-CM | POA: Insufficient documentation

## 2018-09-11 DIAGNOSIS — I5022 Chronic systolic (congestive) heart failure: Secondary | ICD-10-CM | POA: Insufficient documentation

## 2018-09-11 DIAGNOSIS — Z951 Presence of aortocoronary bypass graft: Secondary | ICD-10-CM | POA: Insufficient documentation

## 2018-09-11 DIAGNOSIS — Z881 Allergy status to other antibiotic agents status: Secondary | ICD-10-CM | POA: Insufficient documentation

## 2018-09-11 DIAGNOSIS — Z955 Presence of coronary angioplasty implant and graft: Secondary | ICD-10-CM | POA: Insufficient documentation

## 2018-09-11 DIAGNOSIS — K219 Gastro-esophageal reflux disease without esophagitis: Secondary | ICD-10-CM | POA: Diagnosis not present

## 2018-09-11 DIAGNOSIS — Z7902 Long term (current) use of antithrombotics/antiplatelets: Secondary | ICD-10-CM | POA: Diagnosis not present

## 2018-09-11 DIAGNOSIS — E7800 Pure hypercholesterolemia, unspecified: Secondary | ICD-10-CM | POA: Insufficient documentation

## 2018-09-11 DIAGNOSIS — Z888 Allergy status to other drugs, medicaments and biological substances status: Secondary | ICD-10-CM | POA: Diagnosis not present

## 2018-09-11 DIAGNOSIS — D649 Anemia, unspecified: Secondary | ICD-10-CM | POA: Insufficient documentation

## 2018-09-11 DIAGNOSIS — I110 Hypertensive heart disease with heart failure: Secondary | ICD-10-CM | POA: Diagnosis not present

## 2018-09-11 DIAGNOSIS — I2510 Atherosclerotic heart disease of native coronary artery without angina pectoris: Secondary | ICD-10-CM | POA: Diagnosis not present

## 2018-09-11 DIAGNOSIS — Z794 Long term (current) use of insulin: Secondary | ICD-10-CM | POA: Insufficient documentation

## 2018-09-11 DIAGNOSIS — I255 Ischemic cardiomyopathy: Secondary | ICD-10-CM | POA: Diagnosis not present

## 2018-09-11 DIAGNOSIS — T82855D Stenosis of coronary artery stent, subsequent encounter: Secondary | ICD-10-CM

## 2018-09-11 DIAGNOSIS — Z79899 Other long term (current) drug therapy: Secondary | ICD-10-CM | POA: Diagnosis not present

## 2018-09-11 DIAGNOSIS — I252 Old myocardial infarction: Secondary | ICD-10-CM | POA: Insufficient documentation

## 2018-09-11 DIAGNOSIS — E119 Type 2 diabetes mellitus without complications: Secondary | ICD-10-CM | POA: Insufficient documentation

## 2018-09-11 DIAGNOSIS — J441 Chronic obstructive pulmonary disease with (acute) exacerbation: Secondary | ICD-10-CM | POA: Diagnosis not present

## 2018-09-11 DIAGNOSIS — E1143 Type 2 diabetes mellitus with diabetic autonomic (poly)neuropathy: Secondary | ICD-10-CM

## 2018-09-11 DIAGNOSIS — Z8249 Family history of ischemic heart disease and other diseases of the circulatory system: Secondary | ICD-10-CM | POA: Diagnosis not present

## 2018-09-11 DIAGNOSIS — I10 Essential (primary) hypertension: Secondary | ICD-10-CM

## 2018-09-11 NOTE — Patient Instructions (Addendum)
Continue weighing daily and call for an overnight weight gain of > 2 pounds or a weekly weight gain of >5 pounds.  Do not take brilinta anymore and resume your plavix (clopidogrel) at 75mg  once daily

## 2018-09-12 DIAGNOSIS — I25810 Atherosclerosis of coronary artery bypass graft(s) without angina pectoris: Secondary | ICD-10-CM | POA: Diagnosis not present

## 2018-09-12 DIAGNOSIS — I5022 Chronic systolic (congestive) heart failure: Secondary | ICD-10-CM | POA: Diagnosis not present

## 2018-09-19 DIAGNOSIS — E782 Mixed hyperlipidemia: Secondary | ICD-10-CM | POA: Diagnosis not present

## 2018-09-19 DIAGNOSIS — I10 Essential (primary) hypertension: Secondary | ICD-10-CM | POA: Diagnosis not present

## 2018-09-19 DIAGNOSIS — I25708 Atherosclerosis of coronary artery bypass graft(s), unspecified, with other forms of angina pectoris: Secondary | ICD-10-CM | POA: Diagnosis not present

## 2018-09-19 DIAGNOSIS — I255 Ischemic cardiomyopathy: Secondary | ICD-10-CM | POA: Diagnosis not present

## 2018-09-20 DIAGNOSIS — E1142 Type 2 diabetes mellitus with diabetic polyneuropathy: Secondary | ICD-10-CM | POA: Diagnosis not present

## 2018-09-22 DIAGNOSIS — C61 Malignant neoplasm of prostate: Secondary | ICD-10-CM | POA: Diagnosis not present

## 2018-09-27 DIAGNOSIS — E1159 Type 2 diabetes mellitus with other circulatory complications: Secondary | ICD-10-CM | POA: Diagnosis not present

## 2018-09-27 DIAGNOSIS — E1122 Type 2 diabetes mellitus with diabetic chronic kidney disease: Secondary | ICD-10-CM | POA: Diagnosis not present

## 2018-09-27 DIAGNOSIS — E1142 Type 2 diabetes mellitus with diabetic polyneuropathy: Secondary | ICD-10-CM | POA: Diagnosis not present

## 2018-09-27 DIAGNOSIS — Z794 Long term (current) use of insulin: Secondary | ICD-10-CM | POA: Diagnosis not present

## 2018-09-27 DIAGNOSIS — N183 Chronic kidney disease, stage 3 (moderate): Secondary | ICD-10-CM | POA: Diagnosis not present

## 2018-09-27 DIAGNOSIS — E669 Obesity, unspecified: Secondary | ICD-10-CM | POA: Diagnosis not present

## 2018-09-27 DIAGNOSIS — E1169 Type 2 diabetes mellitus with other specified complication: Secondary | ICD-10-CM | POA: Diagnosis not present

## 2018-10-04 IMAGING — DX DG CHEST 1V PORT
1 series · 1 of 1 positions shown · non-contrast
Comparison: Chest radiograph performed 12/04/2017

CLINICAL DATA: Acute onset of worsening shortness of breath.

EXAM:
PORTABLE CHEST 1 VIEW

[chest ap]
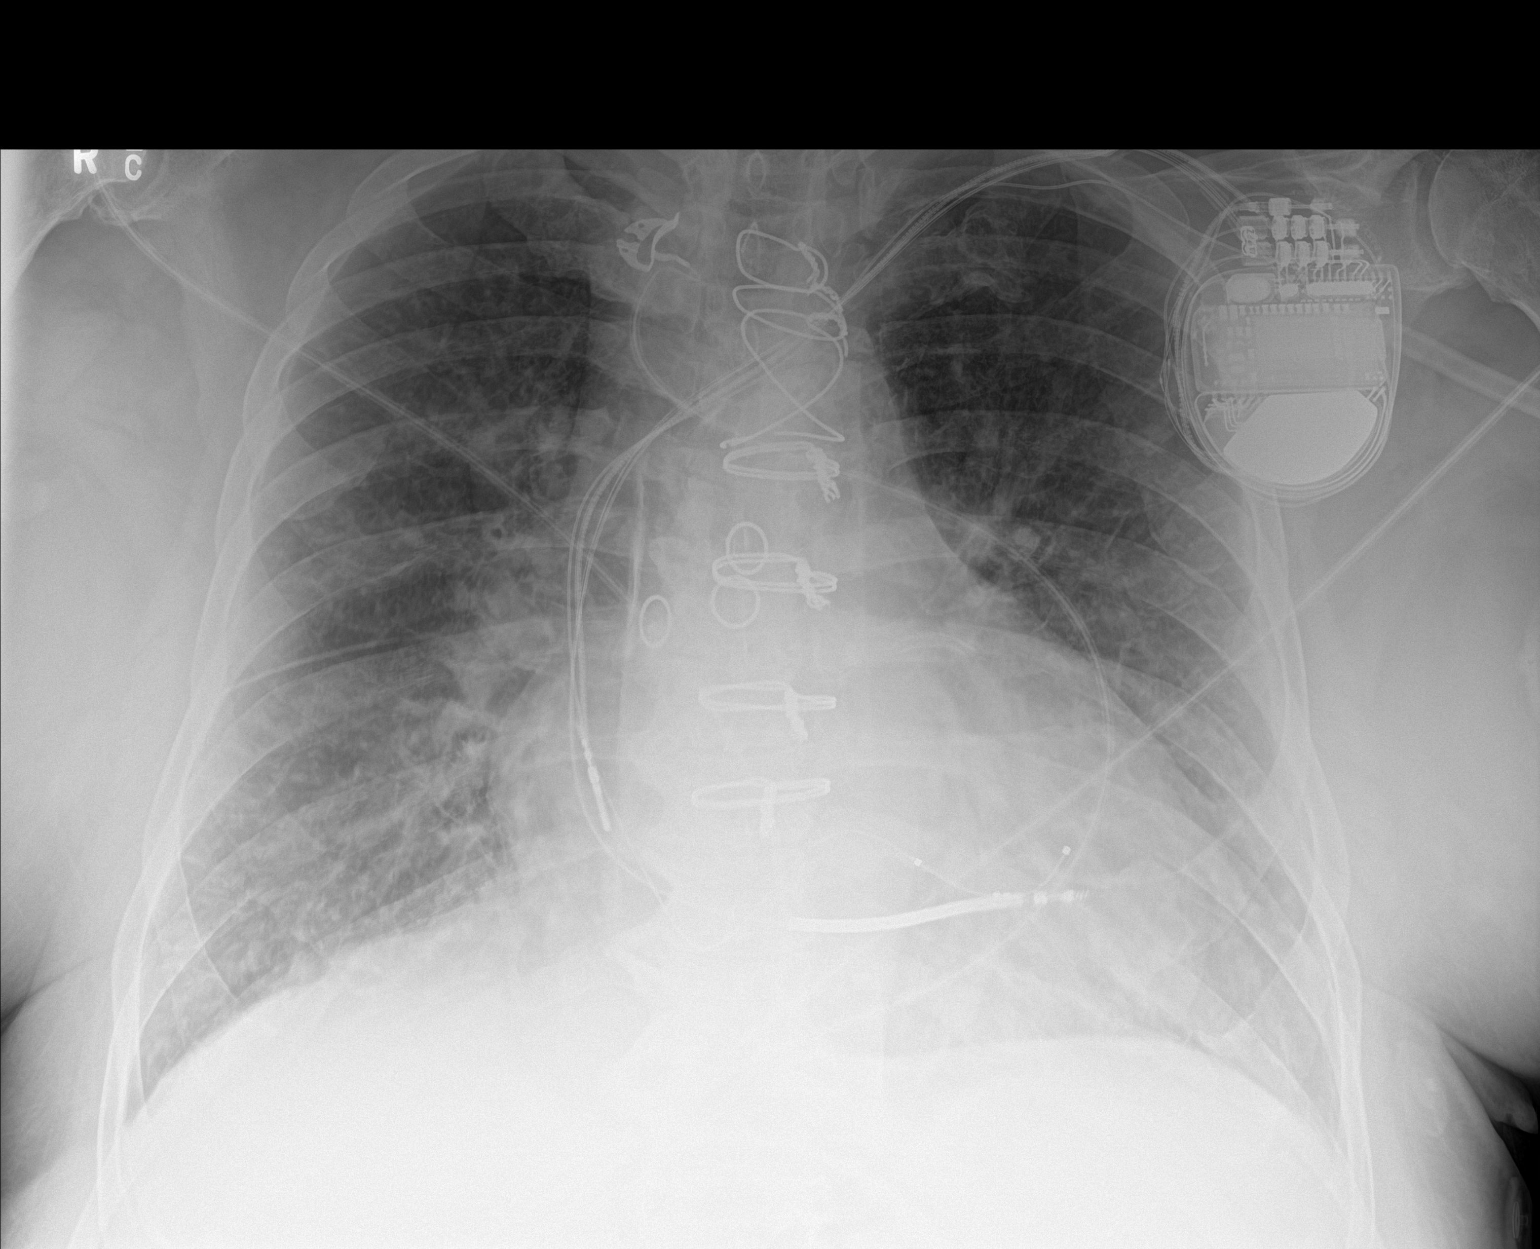

[1 of 1 positions shown; findings below may reference images not displayed]

FINDINGS: The lungs are well-aerated. Mild vascular congestion is noted. Mild
bibasilar opacities raise concern for pulmonary edema. There is no
evidence of pleural effusion or pneumothorax.

The cardiomediastinal silhouette is mildly enlarged. The patient is
status post median sternotomy, with evidence of prior CABG. A
pacemaker/AICD is noted overlying the left chest wall, with leads
ending overlying the right atrium and right ventricle. No acute
osseous abnormalities are seen.
IMPRESSION: Mild vascular congestion and mild cardiomegaly. Bibasilar airspace
opacities raise concern for pulmonary edema.

## 2018-10-16 ENCOUNTER — Ambulatory Visit: Payer: PPO | Attending: Family | Admitting: Family

## 2018-10-16 ENCOUNTER — Encounter: Payer: Self-pay | Admitting: Family

## 2018-10-16 VITALS — BP 113/40 | HR 82 | Resp 18 | Ht 68.0 in | Wt 240.4 lb

## 2018-10-16 DIAGNOSIS — J449 Chronic obstructive pulmonary disease, unspecified: Secondary | ICD-10-CM | POA: Insufficient documentation

## 2018-10-16 DIAGNOSIS — Z951 Presence of aortocoronary bypass graft: Secondary | ICD-10-CM | POA: Diagnosis not present

## 2018-10-16 DIAGNOSIS — I5022 Chronic systolic (congestive) heart failure: Secondary | ICD-10-CM | POA: Insufficient documentation

## 2018-10-16 DIAGNOSIS — Z7982 Long term (current) use of aspirin: Secondary | ICD-10-CM | POA: Diagnosis not present

## 2018-10-16 DIAGNOSIS — E7800 Pure hypercholesterolemia, unspecified: Secondary | ICD-10-CM | POA: Diagnosis not present

## 2018-10-16 DIAGNOSIS — Z8546 Personal history of malignant neoplasm of prostate: Secondary | ICD-10-CM | POA: Insufficient documentation

## 2018-10-16 DIAGNOSIS — I2510 Atherosclerotic heart disease of native coronary artery without angina pectoris: Secondary | ICD-10-CM | POA: Diagnosis not present

## 2018-10-16 DIAGNOSIS — I110 Hypertensive heart disease with heart failure: Secondary | ICD-10-CM | POA: Insufficient documentation

## 2018-10-16 DIAGNOSIS — K219 Gastro-esophageal reflux disease without esophagitis: Secondary | ICD-10-CM | POA: Diagnosis not present

## 2018-10-16 DIAGNOSIS — Z7902 Long term (current) use of antithrombotics/antiplatelets: Secondary | ICD-10-CM | POA: Diagnosis not present

## 2018-10-16 DIAGNOSIS — I255 Ischemic cardiomyopathy: Secondary | ICD-10-CM | POA: Insufficient documentation

## 2018-10-16 DIAGNOSIS — Z955 Presence of coronary angioplasty implant and graft: Secondary | ICD-10-CM | POA: Diagnosis not present

## 2018-10-16 DIAGNOSIS — E119 Type 2 diabetes mellitus without complications: Secondary | ICD-10-CM | POA: Diagnosis not present

## 2018-10-16 DIAGNOSIS — Z794 Long term (current) use of insulin: Secondary | ICD-10-CM | POA: Diagnosis not present

## 2018-10-16 DIAGNOSIS — I252 Old myocardial infarction: Secondary | ICD-10-CM | POA: Diagnosis not present

## 2018-10-16 DIAGNOSIS — Z87891 Personal history of nicotine dependence: Secondary | ICD-10-CM | POA: Diagnosis not present

## 2018-10-16 DIAGNOSIS — T82855D Stenosis of coronary artery stent, subsequent encounter: Secondary | ICD-10-CM

## 2018-10-16 DIAGNOSIS — Z881 Allergy status to other antibiotic agents status: Secondary | ICD-10-CM | POA: Insufficient documentation

## 2018-10-16 DIAGNOSIS — Z888 Allergy status to other drugs, medicaments and biological substances status: Secondary | ICD-10-CM | POA: Diagnosis not present

## 2018-10-16 DIAGNOSIS — I10 Essential (primary) hypertension: Secondary | ICD-10-CM

## 2018-10-16 DIAGNOSIS — E1143 Type 2 diabetes mellitus with diabetic autonomic (poly)neuropathy: Secondary | ICD-10-CM

## 2018-10-16 DIAGNOSIS — Z79899 Other long term (current) drug therapy: Secondary | ICD-10-CM | POA: Diagnosis not present

## 2018-10-16 NOTE — Progress Notes (Signed)
Patient ID: KISHAN WACHSMUTH, male    DOB: 1946-05-29, 72 y.o.   MRN: [id number]  HPI  Mr Tippin is a 72 y/o male with a history of prostate cancer, DM, hyperlipidemia, HTN, anemia, COPD, GERD, MI, previous tobacco use and chronic heart failure.   Echo report from 09/03/18 reviewed and showed an EF of 20% along with mild TR. Echo report from 02/18/18 reviewed and showed an EF of 20-25% along with trivial AR and moderate MR. Echo report from 12/05/17 reviewed and showed an EF of 20-25% along with mild MR and moderate MR.   Cardiac catheterization done 09/04/18 showed:  ejection fraction of 15% severe 3 vessel coronary artery disease There is occlusion of all native vessels at the ostium Patent graft to obtuse marginal 2 Patent graft to diagonal artery Patent graft to left anterior descending artery There is significant coronary artery disease and or stenosis involving the graft ostium to the right coronary  Stent placed to ostial SVG to RCA.   Cardiac catheterization done 02/02/16 showed severed 3-vessel CAD with patent grafts. Continue medication management along with dual antiplatelet therapy.   Was in the ED 09/09/18 due to shortness of breath with a normal exam. Released same day. Admitted 09/02/18 due to chest pain along with HF exacerbation. Cardiology consult obtained. Catheterization/ stent done. Initially needed IV lasix and then transitioned to oral diuretics. Discharged after 3 days. Admitted 06/06/18 due to COPD exacerbation. Given IV solu-medrol and nebulizer treatments and then switched or oral prednisone. Spiriva was added to regimen. HF was stable and he was discharged after 2 days. Was in the ED 06/04/18 for shortness of breath where he was treated and released.   He presents today for a follow-up visit with a chief complaint of moderate fatigue upon minimal exertion. He describes this as chronic in nature having been present for several years. He has associated shortness of breath,  easy bruising and slight weight gain. He denies any difficulty sleeping, abdominal distention, palpitations, pedal edema, chest pain, wheezing, cough or dizziness.   Past Medical History:  Diagnosis Date  . Anemia   . Cancer ([place]) 12/2013   prostate  . Cardiogenic pulmonary edema ([place]) 12/19/2014  . Cardiomyopathy, ischemic   . CHF (congestive heart failure) ([place])   . COPD (chronic obstructive pulmonary disease) ([place])   . Diabetes mellitus without complication ([place])   . GERD (gastroesophageal reflux disease)   . Hypercholesteremia   . Hypertension   . Myocardial infarction ([place]) [id number]  . Shortness of breath dyspnea    Past Surgical History:  Procedure Laterality Date  . CARDIAC CATHETERIZATION N/A 02/02/2016   Procedure: Left Heart Cath and Coronary Angiography;  Surgeon: [name], MD;  Location: [facility];  Service: Cardiovascular;  Laterality: N/A;  . CORONARY ANGIOPLASTY WITH STENT PLACEMENT    . CORONARY ARTERY BYPASS GRAFT  11/24/2010  . CORONARY STENT INTERVENTION N/A 09/04/2018   Procedure: CORONARY STENT INTERVENTION;  Surgeon: [name], MD;  Location: [facility];  Service: Cardiovascular;  Laterality: N/A;  . IMPLANTABLE CARDIOVERTER DEFIBRILLATOR (ICD) GENERATOR CHANGE Left 12/12/2015   Procedure: DUAL LEAD PLACEMENT CARDIAC DIFIBRILLATOR;  Surgeon: [name], MD;  Location: [facility];  Service: Cardiovascular;  Laterality: Left;  . LEFT HEART CATH AND CORS/GRAFTS ANGIOGRAPHY N/A 09/04/2018   Procedure: LEFT HEART CATH AND CORS/GRAFTS ANGIOGRAPHY;  Surgeon: [name], MD;  Location: [place] [facility];  Service: Cardiovascular;  Laterality: N/A;  . TONSILLECTOMY  Family History  Problem Relation Age of Onset  . CAD Mother   . Cancer Mother   . Diabetes Mother   . Alzheimer's disease Father   . Cancer Father   . Heart disease Father    Social History   Tobacco Use  . Smoking status: Former Smoker     Packs/day: 2.00    Years: 30.00    Pack years: 60.00    Last attempt to quit: 12/03/1996    Years since quitting: 21.8  . Smokeless tobacco: Never Used  Substance Use Topics  . Alcohol use: No    Alcohol/week: 0.0 standard drinks   Allergies  Allergen Reactions  . Brilinta [Ticagrelor] Shortness Of Breath  . Tuberculin Tests Rash  . Benadryl [Diphenhydramine] Other (See Comments)    " Hyperactivity"  . Doxycycline Swelling    Pt went into pulmonary edema.  . Lopid [Gemfibrozil] Swelling    "I gain 1 pound a day for 30 days."   Prior to Admission medications   Medication Sig Start Date End Date Taking? Authorizing Provider  acetaminophen (TYLENOL) 500 MG tablet Take 1,000 mg by mouth daily as needed for moderate pain.   Yes [provider]  albuterol (PROVENTIL HFA;VENTOLIN HFA) 108 (90 Base) MCG/ACT inhaler Inhale 2 puffs into the lungs every 6 (six) hours as needed for wheezing or shortness of breath. 06/04/18  Yes [name], [name], MD  allopurinol (ZYLOPRIM) 100 MG tablet Take 100 mg by mouth daily.   Yes [provider]  aspirin 81 MG chewable tablet Chew 1 tablet (81 mg total) by mouth daily. 09/06/18  Yes Mayo, Pete [name], MD  atorvastatin (LIPITOR) 80 MG tablet Take 1 tablet (80 mg total) by mouth every evening. 04/21/17  Yes Theodoro [name], MD  budesonide-formoterol (SYMBICORT) 80-4.5 MCG/ACT inhaler Inhale 2 puffs into the lungs 2 (two) times daily.   Yes [provider]  calcium carbonate (TUMS - DOSED IN MG ELEMENTAL CALCIUM) 500 MG chewable tablet Chew 1 tablet by mouth as needed for indigestion or heartburn.   Yes [provider]  carvedilol (COREG) 3.125 MG tablet Take 1 tablet (3.125 mg total) by mouth 2 (two) times daily with a meal. 01/01/18  Yes Gladstone [name], MD  clopidogrel (PLAVIX) 75 MG tablet Take 75 mg by mouth daily.   Yes [provider]  co-enzyme Q-10 30 MG capsule Take 30 mg by mouth daily.   Yes [provider]  ferrous sulfate 325 (65 FE) MG tablet Take 325 mg by mouth 2 (two) times daily with a meal.   Yes [provider]  FLUoxetine (PROZAC) 20 MG capsule Take 20 mg by mouth daily.   Yes [provider]  gabapentin (NEURONTIN) 300 MG capsule Take 300 mg by mouth 2 (two) times daily.    Yes [provider]  insulin glargine (LANTUS) 100 UNIT/ML injection Inject 0.6 mLs (60 Units total) into the skin at bedtime. Patient taking differently: Inject 60 Units into the skin at bedtime. Take 60-80 units at bedtime depending on what he has eaten. 01/01/18  Yes Gladstone [name], MD  insulin lispro (HUMALOG) 100 UNIT/ML KiwkPen Inject 0-15 Units into the skin 3 (three) times daily with meals. Per sliding scale   Yes [provider]  lansoprazole (PREVACID) 15 MG capsule Take 30 mg by mouth 2 (two) times daily.   Yes [provider]  loratadine (CLARITIN) 10 MG tablet Take 10 mg by mouth daily.   Yes [provider]  Magnesium Oxide 400 (240 Mg) MG TABS Take 1 tablet (400 mg total) by mouth 2 (two) times daily. 05/30/18  Yes , Otila Kluver A, FNP  Multiple Vitamins-Minerals (CENTRUM SILVER PO) Take 1 tablet by mouth every morning.   Yes [provider]  Multiple Vitamins-Minerals (PRESERVISION AREDS 2) CAPS Take 1 capsule by mouth 2 (two) times daily. For macular degeneration- eye vitamins   Yes [provider]  nitroGLYCERIN (NITROSTAT) 0.4 MG SL tablet Place 0.4 mg under the tongue every 5 (five) minutes as needed for chest pain.   Yes [provider]  ramipril (ALTACE) 2.5 MG capsule Take 2.5 mg by mouth daily.   Yes [provider]  ranolazine (RANEXA) 500 MG 12 hr tablet Take 1 tablet (500 mg total) by mouth 2 (two) times daily. 05/30/18  Yes , Otila Kluver A, FNP  spironolactone (ALDACTONE) 25 MG tablet Take 25 mg by mouth daily.    Yes [provider]  tamsulosin (FLOMAX) 0.4 MG CAPS capsule Take 0.4  mg by mouth at bedtime.    Yes [provider]  tiotropium (SPIRIVA) 18 MCG inhalation capsule Place 1 capsule (18 mcg total) into inhaler and inhale daily. 06/08/18  Yes Wieting, Richard, MD  torsemide (DEMADEX) 20 MG tablet Take 1 tablet (20 mg total) by mouth 2 (two) times daily. 1 tab po twice a day.  For weight gain of 3 lbs in one day or five lbs in a week then increase to 2 tabs in am and one in afternoon 06/08/18  Yes Loletha Grayer, MD    Review of Systems  Constitutional: Positive for fatigue. Negative for appetite change.  HENT: Negative for congestion, postnasal drip, sore throat and voice change.   Eyes: Negative.   Respiratory: Positive for shortness of breath (much better since off brilinta). Negative for cough, chest tightness and wheezing.   Cardiovascular: Negative for chest pain, palpitations and leg swelling.  Gastrointestinal: Negative for abdominal distention and abdominal pain.  Endocrine: Negative.   Genitourinary: Negative.   Musculoskeletal: Positive for arthralgias (leg pain when walking long distances).  Skin: Negative.   Allergic/Immunologic: Negative.   Neurological: Negative for dizziness and light-headedness.  Hematological: Negative for adenopathy. Bruises/bleeds easily.  Psychiatric/Behavioral: Negative for dysphoric mood and sleep disturbance (sleeping well with CPAP and oxygen). The patient is not nervous/anxious.    Vitals:   10/16/18 1349  BP: (!) 113/40  Pulse: 82  Resp: 18  SpO2: 97%  Weight: 240 lb 6 oz (109 kg)  Height: 5\' 8"  (1.727 m)   Wt Readings from Last 3 Encounters:  10/16/18 240 lb 6 oz (109 kg)  09/11/18 238 lb 2 oz (108 kg)  09/09/18 236 lb 1.8 oz (107.1 kg)   Lab Results  Component Value Date   CREATININE 1.23 09/09/2018   CREATININE 1.12 09/05/2018   CREATININE 1.19 09/04/2018    Physical Exam  Constitutional: He is oriented to person, place, and time. He appears well-developed and well-nourished.  HENT:  Head:  Normocephalic and atraumatic.  Neck: Normal range of motion. Neck supple. No JVD present.  Cardiovascular: Normal rate and regular rhythm.  Pulmonary/Chest: Effort normal. He has no wheezes. He has no rales.  Abdominal: He exhibits no distension. There is no tenderness.  Musculoskeletal: He exhibits no edema or tenderness.  Neurological: He is alert and oriented to person, place, and time.  Skin: Skin is warm and dry.  Psychiatric: He has a normal mood and affect. His behavior is normal. Thought content normal.  Nursing note and vitals reviewed.  Assessment & Plan:  1: Chronic heart failure with reduced ejection fraction-  - NYHA class III - euvolemic today - has been weighing daily and home weight chart was reviewed. Reminded to call for an overnight weight gain of 2 pounds or > or a weekly weight gain of >5 pounds - weight up 2 pounds since he was last here 1 month ago - not adding salt. Eats at [employer] 7 days a week but most days he takes his own breakfast and goes to socialize.  - recently ate at [employer] for a fundraiser and says that he ate fajitas as he thought that would be the best option. Discussed the importance of closely following a 2000mg  sodium diet and to limit fast food - saw cardiology Nehemiah Massed) 09/19/18 - BNP from 09/09/18 was 338.0  2: HTN- - BP looks good although on the low side - saw PCP ([place]) 09/12/18 - BMP from 09/12/18 reviewed and showed sodium 143, potassium 3.8, creatinine 1.4 and GFR 50   3:Diabetes-  - A1c on 09/20/18 was 6.1% - saw endocrinologist ([place]) 09/27/18  4: Stent stenosis- - shortness of breath dramatically improved after stopping brilinta and resuming plavix  Patient's medication list was reviewed.  Return in 6 weeks or sooner for any questions/problems before then.

## 2018-10-16 NOTE — Patient Instructions (Signed)
Continue weighing daily and call for an overnight weight gain of > 2 pounds or a weekly weight gain of >5 pounds. 

## 2018-11-19 NOTE — Progress Notes (Deleted)
Patient ID: [name], male    DOB: Sep 04, 1946, 72 y.o.   MRN: [id number]  HPI  [name] is a 72 y/o male with a history of prostate cancer, DM, hyperlipidemia, HTN, anemia, COPD, GERD, MI, previous tobacco use and chronic heart failure.   Echo report from 09/03/18 reviewed and showed an EF of 20% along with mild TR. Echo report from 02/18/18 reviewed and showed an EF of 20-25% along with trivial AR and moderate Daniel. Echo report from 12/05/17 reviewed and showed an EF of 20-25% along with mild Daniel and moderate Daniel.   Cardiac catheterization done 09/04/18 showed:  ejection fraction of 15% severe 3 vessel coronary artery disease There is occlusion of all native vessels at the ostium Patent graft to obtuse marginal 2 Patent graft to diagonal artery Patent graft to left anterior descending artery There is significant coronary artery disease and or stenosis involving the graft ostium to the right coronary  Stent placed to ostial SVG to RCA.   Cardiac catheterization done 02/02/16 showed severed 3-vessel CAD with patent grafts. Continue medication management along with dual antiplatelet therapy.   Was in the ED 09/09/18 due to shortness of breath with a normal exam. Released same day. Admitted 09/02/18 due to chest pain along with HF exacerbation. Cardiology consult obtained. Catheterization/ stent done. Initially needed IV lasix and then transitioned to oral diuretics. Discharged after 3 days. Admitted 06/06/18 due to COPD exacerbation. Given IV solu-medrol and nebulizer treatments and then switched or oral prednisone. Spiriva was added to regimen. HF was stable and he was discharged after 2 days. Was in the ED 06/04/18 for shortness of breath where he was treated and released.   He presents today for a follow-up visit with a chief complaint of   Past Medical History:  Diagnosis Date  . Anemia   . Cancer ([place]) 12/2013   prostate  . Cardiogenic pulmonary edema ([place]) 12/19/2014  .  Cardiomyopathy, ischemic   . CHF (congestive heart failure) ([place])   . COPD (chronic obstructive pulmonary disease) ([place])   . Diabetes mellitus without complication ([place])   . GERD (gastroesophageal reflux disease)   . Hypercholesteremia   . Hypertension   . Myocardial infarction ([place]) [id number]  . Shortness of breath dyspnea    Past Surgical History:  Procedure Laterality Date  . CARDIAC CATHETERIZATION N/A 02/02/2016   Procedure: Left Heart Cath and Coronary Angiography;  Surgeon: [name], MD;  Location: [place];  Service: Cardiovascular;  Laterality: N/A;  . CORONARY ANGIOPLASTY WITH STENT PLACEMENT    . CORONARY ARTERY BYPASS GRAFT  11/24/2010  . CORONARY STENT INTERVENTION N/A 09/04/2018   Procedure: CORONARY STENT INTERVENTION;  Surgeon: [name], MD;  Location: [facility];  Service: Cardiovascular;  Laterality: N/A;  . IMPLANTABLE CARDIOVERTER DEFIBRILLATOR (ICD) GENERATOR CHANGE Left 12/12/2015   Procedure: DUAL LEAD PLACEMENT CARDIAC DIFIBRILLATOR;  Surgeon: [name], MD;  Location: [facility];  Service: Cardiovascular;  Laterality: Left;  . LEFT HEART CATH AND CORS/GRAFTS ANGIOGRAPHY N/A 09/04/2018   Procedure: LEFT HEART CATH AND CORS/GRAFTS ANGIOGRAPHY;  Surgeon: [name], MD;  Location: [facility];  Service: Cardiovascular;  Laterality: N/A;  . TONSILLECTOMY     Family History  Problem Relation Age of Onset  . CAD Mother   . Cancer Mother   . Diabetes Mother   . Alzheimer's disease Father   . Cancer Father   . Heart disease Father  Social History   Tobacco Use  . Smoking status: Former Smoker    Packs/day: 2.00    Years: 30.00    Pack years: 60.00    Last attempt to quit: 12/03/1996    Years since quitting: 21.9  . Smokeless tobacco: Never Used  Substance Use Topics  . Alcohol use: No    Alcohol/week: 0.0 standard drinks   Allergies  Allergen Reactions  . Brilinta [Ticagrelor] Shortness Of  Breath  . Tuberculin Tests Rash  . Benadryl [Diphenhydramine] Other (See Comments)    " Hyperactivity"  . Doxycycline Swelling    Pt went into pulmonary edema.  . Lopid [Gemfibrozil] Swelling    "I gain 1 pound a day for 30 days."     Review of Systems  Constitutional: Positive for fatigue. Negative for appetite change.  HENT: Negative for congestion, postnasal drip, sore throat and voice change.   Eyes: Negative.   Respiratory: Positive for shortness of breath (much better since off brilinta). Negative for cough, chest tightness and wheezing.   Cardiovascular: Negative for chest pain, palpitations and leg swelling.  Gastrointestinal: Negative for abdominal distention and abdominal pain.  Endocrine: Negative.   Genitourinary: Negative.   Musculoskeletal: Positive for arthralgias (leg pain when walking long distances).  Skin: Negative.   Allergic/Immunologic: Negative.   Neurological: Negative for dizziness and light-headedness.  Hematological: Negative for adenopathy. Bruises/bleeds easily.  Psychiatric/Behavioral: Negative for dysphoric mood and sleep disturbance (sleeping well with CPAP and oxygen). The patient is not nervous/anxious.      Physical Exam  Constitutional: He is oriented to person, place, and time. He appears well-developed and well-nourished.  HENT:  Head: Normocephalic and atraumatic.  Neck: Normal range of motion. Neck supple. No JVD present.  Cardiovascular: Normal rate and regular rhythm.  Pulmonary/Chest: Effort normal. He has no wheezes. He has no rales.  Abdominal: He exhibits no distension. There is no abdominal tenderness.  Musculoskeletal:        General: No tenderness or edema.  Neurological: He is alert and oriented to person, place, and time.  Skin: Skin is warm and dry.  Psychiatric: He has a normal mood and affect. His behavior is normal. Thought content normal.  Nursing note and vitals reviewed.  Assessment & Plan:  1: Chronic heart  failure with reduced ejection fraction-  - NYHA class III - euvolemic today - has been weighing daily and home weight chart was reviewed. Reminded to call for an overnight weight gain of 2 pounds or > or a weekly weight gain of >5 pounds - weight  - not adding salt. Eats at [employer] 7 days a week but most days he takes his own breakfast and goes to socialize.  - recently ate at [employer] for a fundraiser and says that he ate fajitas as he thought that would be the best option. Discussed the importance of closely following a 2000mg  sodium diet and to limit fast food - saw cardiology Nehemiah Massed) 09/19/18 - BNP from 09/09/18 was 338.0  2: HTN- - BP  - saw PCP ([place]) 09/12/18 - BMP from 09/12/18 reviewed and showed sodium 143, potassium 3.8, creatinine 1.4 and GFR 50   3:Diabetes-  - A1c on 09/20/18 was 6.1% - saw endocrinologist ([place]) 09/27/18   Patient's medication list was reviewed.

## 2018-11-20 ENCOUNTER — Ambulatory Visit: Payer: PPO | Admitting: Family

## 2018-11-21 ENCOUNTER — Encounter: Payer: Self-pay | Admitting: Family

## 2018-11-21 ENCOUNTER — Ambulatory Visit: Payer: PPO | Attending: Family | Admitting: Family

## 2018-11-21 VITALS — BP 104/52 | HR 76 | Resp 18 | Ht 68.0 in | Wt 234.5 lb

## 2018-11-21 DIAGNOSIS — Z794 Long term (current) use of insulin: Secondary | ICD-10-CM | POA: Diagnosis not present

## 2018-11-21 DIAGNOSIS — J449 Chronic obstructive pulmonary disease, unspecified: Secondary | ICD-10-CM | POA: Diagnosis not present

## 2018-11-21 DIAGNOSIS — I252 Old myocardial infarction: Secondary | ICD-10-CM | POA: Diagnosis not present

## 2018-11-21 DIAGNOSIS — K219 Gastro-esophageal reflux disease without esophagitis: Secondary | ICD-10-CM | POA: Insufficient documentation

## 2018-11-21 DIAGNOSIS — Z951 Presence of aortocoronary bypass graft: Secondary | ICD-10-CM | POA: Diagnosis not present

## 2018-11-21 DIAGNOSIS — Z881 Allergy status to other antibiotic agents status: Secondary | ICD-10-CM | POA: Insufficient documentation

## 2018-11-21 DIAGNOSIS — E785 Hyperlipidemia, unspecified: Secondary | ICD-10-CM | POA: Insufficient documentation

## 2018-11-21 DIAGNOSIS — Z7902 Long term (current) use of antithrombotics/antiplatelets: Secondary | ICD-10-CM | POA: Diagnosis not present

## 2018-11-21 DIAGNOSIS — E119 Type 2 diabetes mellitus without complications: Secondary | ICD-10-CM | POA: Diagnosis not present

## 2018-11-21 DIAGNOSIS — I5022 Chronic systolic (congestive) heart failure: Secondary | ICD-10-CM | POA: Insufficient documentation

## 2018-11-21 DIAGNOSIS — Z833 Family history of diabetes mellitus: Secondary | ICD-10-CM | POA: Diagnosis not present

## 2018-11-21 DIAGNOSIS — Z7982 Long term (current) use of aspirin: Secondary | ICD-10-CM | POA: Insufficient documentation

## 2018-11-21 DIAGNOSIS — D649 Anemia, unspecified: Secondary | ICD-10-CM | POA: Insufficient documentation

## 2018-11-21 DIAGNOSIS — Z7951 Long term (current) use of inhaled steroids: Secondary | ICD-10-CM | POA: Diagnosis not present

## 2018-11-21 DIAGNOSIS — I10 Essential (primary) hypertension: Secondary | ICD-10-CM

## 2018-11-21 DIAGNOSIS — E1143 Type 2 diabetes mellitus with diabetic autonomic (poly)neuropathy: Secondary | ICD-10-CM

## 2018-11-21 DIAGNOSIS — I255 Ischemic cardiomyopathy: Secondary | ICD-10-CM | POA: Insufficient documentation

## 2018-11-21 DIAGNOSIS — Z8546 Personal history of malignant neoplasm of prostate: Secondary | ICD-10-CM | POA: Insufficient documentation

## 2018-11-21 DIAGNOSIS — I110 Hypertensive heart disease with heart failure: Secondary | ICD-10-CM | POA: Insufficient documentation

## 2018-11-21 DIAGNOSIS — Z87891 Personal history of nicotine dependence: Secondary | ICD-10-CM | POA: Insufficient documentation

## 2018-11-21 DIAGNOSIS — Z888 Allergy status to other drugs, medicaments and biological substances status: Secondary | ICD-10-CM | POA: Diagnosis not present

## 2018-11-21 DIAGNOSIS — I2510 Atherosclerotic heart disease of native coronary artery without angina pectoris: Secondary | ICD-10-CM | POA: Diagnosis not present

## 2018-11-21 DIAGNOSIS — Z955 Presence of coronary angioplasty implant and graft: Secondary | ICD-10-CM | POA: Insufficient documentation

## 2018-11-21 DIAGNOSIS — Z8249 Family history of ischemic heart disease and other diseases of the circulatory system: Secondary | ICD-10-CM | POA: Insufficient documentation

## 2018-11-21 DIAGNOSIS — Z79899 Other long term (current) drug therapy: Secondary | ICD-10-CM | POA: Insufficient documentation

## 2018-11-21 NOTE — Progress Notes (Signed)
Patient ID: [name], male    DOB: 1946/07/27, 72 y.o.   MRN: [id number]  HPI  [name] is a 72 y/o male with a history of prostate cancer, DM, hyperlipidemia, HTN, anemia, COPD, GERD, MI, previous tobacco use and chronic heart failure.   Echo report from 09/03/18 reviewed and showed an EF of 20% along with mild TR. Echo report from 02/18/18 reviewed and showed an EF of 20-25% along with trivial AR and moderate Daniel. Echo report from 12/05/17 reviewed and showed an EF of 20-25% along with mild Daniel and moderate Daniel.   Cardiac catheterization done 09/04/18 showed:  ejection fraction of 15% severe 3 vessel coronary artery disease There is occlusion of all native vessels at the ostium Patent graft to obtuse marginal 2 Patent graft to diagonal artery Patent graft to left anterior descending artery There is significant coronary artery disease and or stenosis involving the graft ostium to the right coronary  Stent placed to ostial SVG to RCA.   Cardiac catheterization done 02/02/16 showed severed 3-vessel CAD with patent grafts. Continue medication management along with dual antiplatelet therapy.   Was in the ED 09/09/18 due to shortness of breath with a normal exam. Released same day. Admitted 09/02/18 due to chest pain along with HF exacerbation. Cardiology consult obtained. Catheterization/ stent done. Initially needed IV lasix and then transitioned to oral diuretics. Discharged after 3 days. Admitted 06/06/18 due to COPD exacerbation. Given IV solu-medrol and nebulizer treatments and then switched or oral prednisone. Spiriva was added to regimen. HF was stable and he was discharged after 2 days. Was in the ED 06/04/18 for shortness of breath where he was treated and released.   He presents today for a follow-up visit with a chief complaint of moderate fatigue upon minimal exertion. He describes this as chronic in nature having been present for several years. He has associated shortness of breath  along with this. He denies difficulty sleeping, abdominal distention, palpitations, pedal edema, chest pain, wheezing cough, dizziness or weight gain.   Past Medical History:  Diagnosis Date  . Anemia   . Cancer ([place]) 12/2013   prostate  . Cardiogenic pulmonary edema ([place]) 12/19/2014  . Cardiomyopathy, ischemic   . CHF (congestive heart failure) ([place])   . COPD (chronic obstructive pulmonary disease) ([place])   . Diabetes mellitus without complication ([place])   . GERD (gastroesophageal reflux disease)   . Hypercholesteremia   . Hypertension   . Myocardial infarction ([place]) [id number]  . Shortness of breath dyspnea    Past Surgical History:  Procedure Laterality Date  . CARDIAC CATHETERIZATION N/A 02/02/2016   Procedure: Left Heart Cath and Coronary Angiography;  Surgeon: [name], MD;  Location: [facility];  Service: Cardiovascular;  Laterality: N/A;  . CORONARY ANGIOPLASTY WITH STENT PLACEMENT    . CORONARY ARTERY BYPASS GRAFT  11/24/2010  . CORONARY STENT INTERVENTION N/A 09/04/2018   Procedure: CORONARY STENT INTERVENTION;  Surgeon: [name], MD;  Location: [facility];  Service: Cardiovascular;  Laterality: N/A;  . IMPLANTABLE CARDIOVERTER DEFIBRILLATOR (ICD) GENERATOR CHANGE Left 12/12/2015   Procedure: DUAL LEAD PLACEMENT CARDIAC DIFIBRILLATOR;  Surgeon: [name], MD;  Location: [facility];  Service: Cardiovascular;  Laterality: Left;  . LEFT HEART CATH AND CORS/GRAFTS ANGIOGRAPHY N/A 09/04/2018   Procedure: LEFT HEART CATH AND CORS/GRAFTS ANGIOGRAPHY;  Surgeon: [name], MD;  Location: [facility];  Service: Cardiovascular;  Laterality: N/A;  . TONSILLECTOMY  Family History  Problem Relation Age of Onset  . CAD Mother   . Cancer Mother   . Diabetes Mother   . Alzheimer's disease Father   . Cancer Father   . Heart disease Father    Social History   Tobacco Use  . Smoking status: Former Smoker    Packs/day: 2.00     Years: 30.00    Pack years: 60.00    Last attempt to quit: 12/03/1996    Years since quitting: 21.9  . Smokeless tobacco: Never Used  Substance Use Topics  . Alcohol use: No    Alcohol/week: 0.0 standard drinks   Allergies  Allergen Reactions  . Brilinta [Ticagrelor] Shortness Of Breath  . Tuberculin Tests Rash  . Benadryl [Diphenhydramine] Other (See Comments)    " Hyperactivity"  . Doxycycline Swelling    Pt went into pulmonary edema.  . Lopid [Gemfibrozil] Swelling    "I gain 1 pound a day for 30 days."   Prior to Admission medications   Medication Sig Start Date End Date Taking? Authorizing Provider  acetaminophen (TYLENOL) 500 MG tablet Take 1,000 mg by mouth daily as needed for moderate pain.   Yes [provider]  albuterol (PROVENTIL HFA;VENTOLIN HFA) 108 (90 Base) MCG/ACT inhaler Inhale 2 puffs into the lungs every 6 (six) hours as needed for wheezing or shortness of breath. 06/04/18  Yes [name], [name], MD  allopurinol (ZYLOPRIM) 100 MG tablet Take 100 mg by mouth daily.   Yes [provider]  aspirin 81 MG chewable tablet Chew 1 tablet (81 mg total) by mouth daily. 09/06/18  Yes Mayo, Pete [name], MD  atorvastatin (LIPITOR) 80 MG tablet Take 1 tablet (80 mg total) by mouth every evening. 04/21/17  Yes Theodoro [name], MD  budesonide-formoterol (SYMBICORT) 80-4.5 MCG/ACT inhaler Inhale 2 puffs into the lungs 2 (two) times daily.   Yes [provider]  calcium carbonate (TUMS - DOSED IN MG ELEMENTAL CALCIUM) 500 MG chewable tablet Chew 1 tablet by mouth as needed for indigestion or heartburn.   Yes [provider]  carvedilol (COREG) 3.125 MG tablet Take 1 tablet (3.125 mg total) by mouth 2 (two) times daily with a meal. 01/01/18  Yes Gladstone [name], MD  clopidogrel (PLAVIX) 75 MG tablet Take 75 mg by mouth daily.   Yes [provider]  co-enzyme Q-10 30 MG capsule Take 30 mg by mouth daily.   Yes [provider]  ferrous  sulfate 325 (65 FE) MG tablet Take 325 mg by mouth 2 (two) times daily with a meal.   Yes [provider]  FLUoxetine (PROZAC) 20 MG capsule Take 20 mg by mouth daily.   Yes [provider]  gabapentin (NEURONTIN) 300 MG capsule Take 300 mg by mouth 2 (two) times daily.    Yes [provider]  insulin glargine (LANTUS) 100 UNIT/ML injection Inject 0.6 mLs (60 Units total) into the skin at bedtime. Patient taking differently: Inject 60 Units into the skin at bedtime. Take 60-80 units at bedtime depending on what he has eaten. 01/01/18  Yes Gladstone [name], MD  insulin lispro (HUMALOG) 100 UNIT/ML KiwkPen Inject 0-15 Units into the skin 3 (three) times daily with meals. Per sliding scale   Yes [provider]  lansoprazole (PREVACID) 15 MG capsule Take 30 mg by mouth 2 (two) times daily.   Yes [provider]  loratadine (CLARITIN) 10 MG tablet Take 10 mg by mouth daily.   Yes [provider]  Magnesium Oxide 400 (240 Mg) MG TABS Take 1 tablet (400 mg total) by mouth 2 (two) times daily. 05/30/18  Yes Abbigal Radich, Otila Kluver A, FNP  Multiple Vitamins-Minerals (CENTRUM SILVER PO) Take 1 tablet by mouth every morning.   Yes [provider]  Multiple Vitamins-Minerals (PRESERVISION AREDS 2) CAPS Take 1 capsule by mouth 2 (two) times daily. For macular degeneration- eye vitamins   Yes [provider]  nitroGLYCERIN (NITROSTAT) 0.4 MG SL tablet Place 0.4 mg under the tongue every 5 (five) minutes as needed for chest pain.   Yes [provider]  ramipril (ALTACE) 2.5 MG capsule Take 2.5 mg by mouth daily.   Yes [provider]  ranolazine (RANEXA) 500 MG 12 hr tablet Take 1 tablet (500 mg total) by mouth 2 (two) times daily. 05/30/18  Yes Payslee Bateson, Otila Kluver A, FNP  spironolactone (ALDACTONE) 25 MG tablet Take 25 mg by mouth daily.    Yes [provider]  tamsulosin (FLOMAX) 0.4 MG CAPS capsule Take 0.4 mg by mouth at bedtime.     Yes [provider]  tiotropium (SPIRIVA) 18 MCG inhalation capsule Place 1 capsule (18 mcg total) into inhaler and inhale daily. 06/08/18  Yes Wieting, Richard, MD  torsemide (DEMADEX) 20 MG tablet Take 1 tablet (20 mg total) by mouth 2 (two) times daily. 1 tab po twice a day.  For weight gain of 3 lbs in one day or five lbs in a week then increase to 2 tabs in am and one in afternoon 06/08/18  Yes Loletha Grayer, MD    Review of Systems  Constitutional: Positive for fatigue. Negative for appetite change.  HENT: Negative for congestion, postnasal drip, sore throat and voice change.   Eyes: Negative.   Respiratory: Positive for shortness of breath. Negative for cough, chest tightness and wheezing.   Cardiovascular: Negative for chest pain, palpitations and leg swelling.  Gastrointestinal: Negative for abdominal distention and abdominal pain.  Endocrine: Negative.   Genitourinary: Negative.   Musculoskeletal: Positive for arthralgias (leg pain when walking long distances).  Skin: Negative.   Allergic/Immunologic: Negative.   Neurological: Negative for dizziness and light-headedness.  Hematological: Negative for adenopathy. Bruises/bleeds easily.  Psychiatric/Behavioral: Negative for dysphoric mood and sleep disturbance (sleeping well with CPAP and oxygen). The patient is not nervous/anxious.    Vitals:   11/21/18 1318  BP: (!) 104/52  Pulse: 76  Resp: 18  SpO2: 99%  Weight: 234 lb 8 oz (106.4 kg)  Height: 5\' 8"  (1.727 m)   Wt Readings from Last 3 Encounters:  11/21/18 234 lb 8 oz (106.4 kg)  10/16/18 240 lb 6 oz (109 kg)  09/11/18 238 lb 2 oz (108 kg)   Lab Results  Component Value Date   CREATININE 1.23 09/09/2018   CREATININE 1.12 09/05/2018   CREATININE 1.19 09/04/2018   Physical Exam  Constitutional: He is oriented to person, place, and time. He appears well-developed and well-nourished.  HENT:  Head: Normocephalic and atraumatic.  Neck: Normal range of  motion. Neck supple. No JVD present.  Cardiovascular: Normal rate and regular rhythm.  Pulmonary/Chest: Effort normal. He has no wheezes. He has no rales.  Abdominal: He exhibits no distension. There is no abdominal tenderness.  Musculoskeletal:        General: No tenderness or edema.  Neurological: He is alert and oriented to person, place, and time.  Skin: Skin is warm and dry.  Psychiatric: He has a normal mood and affect. His behavior is normal. Thought content  normal.  Nursing note and vitals reviewed.  Assessment & Plan:  1: Chronic heart failure with reduced ejection fraction-  - NYHA class III - euvolemic today - has been weighing daily and home weight chart was reviewed. Reminded to call for an overnight weight gain of 2 pounds or > or a weekly weight gain of >5 pounds - weight down 6 pounds from last visit here 1 month ago - not adding salt. Eats at [employer] 7 days a week but most days he takes his own breakfast and goes to socialize.  - recently ate at [place] as well as the [facility]; reviewed the importance of choosing low sodium items - no longer drinking soft drinks and is drinking more water - saw cardiology Nehemiah Massed) 09/19/18 - BNP from 09/09/18 was 338.0  2: HTN- - BP looks good although on the low side - saw PCP ([facility]) 09/12/18 - BMP from 09/12/18 reviewed and showed sodium 143, potassium 3.8, creatinine 1.4 and GFR 50   3:Diabetes-  - A1c on 09/20/18 was 6.1% - saw endocrinologist ([place]) 09/27/18   Patient's medication list was reviewed.  Return in 1 month or sooner for any questions/problems before then.

## 2018-11-21 NOTE — Patient Instructions (Signed)
Continue weighing daily and call for an overnight weight gain of > 2 pounds or a weekly weight gain of >5 pounds. 

## 2018-11-30 DIAGNOSIS — E119 Type 2 diabetes mellitus without complications: Secondary | ICD-10-CM | POA: Diagnosis not present

## 2018-12-08 DIAGNOSIS — W010XXA Fall on same level from slipping, tripping and stumbling without subsequent striking against object, initial encounter: Secondary | ICD-10-CM | POA: Diagnosis not present

## 2018-12-08 DIAGNOSIS — S51812A Laceration without foreign body of left forearm, initial encounter: Secondary | ICD-10-CM | POA: Diagnosis not present

## 2018-12-08 DIAGNOSIS — J40 Bronchitis, not specified as acute or chronic: Secondary | ICD-10-CM | POA: Diagnosis not present

## 2018-12-08 DIAGNOSIS — I5022 Chronic systolic (congestive) heart failure: Secondary | ICD-10-CM | POA: Diagnosis not present

## 2018-12-09 ENCOUNTER — Emergency Department: Payer: PPO

## 2018-12-09 ENCOUNTER — Other Ambulatory Visit: Payer: Self-pay

## 2018-12-09 ENCOUNTER — Inpatient Hospital Stay
Admission: EM | Admit: 2018-12-09 | Discharge: 2018-12-12 | DRG: 193 | Disposition: A | Discharge status: Home / Self Care | Payer: PPO | Attending: Internal Medicine | Admitting: Internal Medicine

## 2018-12-09 DIAGNOSIS — J181 Lobar pneumonia, unspecified organism: Secondary | ICD-10-CM

## 2018-12-09 DIAGNOSIS — I5022 Chronic systolic (congestive) heart failure: Secondary | ICD-10-CM | POA: Diagnosis not present

## 2018-12-09 DIAGNOSIS — Z8249 Family history of ischemic heart disease and other diseases of the circulatory system: Secondary | ICD-10-CM | POA: Diagnosis not present

## 2018-12-09 DIAGNOSIS — Z955 Presence of coronary angioplasty implant and graft: Secondary | ICD-10-CM | POA: Diagnosis not present

## 2018-12-09 DIAGNOSIS — J101 Influenza due to other identified influenza virus with other respiratory manifestations: Secondary | ICD-10-CM

## 2018-12-09 DIAGNOSIS — J440 Chronic obstructive pulmonary disease with acute lower respiratory infection: Secondary | ICD-10-CM | POA: Diagnosis not present

## 2018-12-09 DIAGNOSIS — I110 Hypertensive heart disease with heart failure: Secondary | ICD-10-CM | POA: Diagnosis not present

## 2018-12-09 DIAGNOSIS — K219 Gastro-esophageal reflux disease without esophagitis: Secondary | ICD-10-CM | POA: Diagnosis present

## 2018-12-09 DIAGNOSIS — Z7982 Long term (current) use of aspirin: Secondary | ICD-10-CM | POA: Diagnosis not present

## 2018-12-09 DIAGNOSIS — J189 Pneumonia, unspecified organism: Secondary | ICD-10-CM | POA: Diagnosis not present

## 2018-12-09 DIAGNOSIS — R0902 Hypoxemia: Secondary | ICD-10-CM | POA: Diagnosis not present

## 2018-12-09 DIAGNOSIS — F329 Major depressive disorder, single episode, unspecified: Secondary | ICD-10-CM | POA: Diagnosis present

## 2018-12-09 DIAGNOSIS — Z87891 Personal history of nicotine dependence: Secondary | ICD-10-CM | POA: Diagnosis not present

## 2018-12-09 DIAGNOSIS — N19 Unspecified kidney failure: Secondary | ICD-10-CM | POA: Diagnosis not present

## 2018-12-09 DIAGNOSIS — E7800 Pure hypercholesterolemia, unspecified: Secondary | ICD-10-CM | POA: Diagnosis present

## 2018-12-09 DIAGNOSIS — I252 Old myocardial infarction: Secondary | ICD-10-CM | POA: Diagnosis not present

## 2018-12-09 DIAGNOSIS — Z881 Allergy status to other antibiotic agents status: Secondary | ICD-10-CM

## 2018-12-09 DIAGNOSIS — J441 Chronic obstructive pulmonary disease with (acute) exacerbation: Secondary | ICD-10-CM | POA: Diagnosis not present

## 2018-12-09 DIAGNOSIS — Z833 Family history of diabetes mellitus: Secondary | ICD-10-CM

## 2018-12-09 DIAGNOSIS — Z951 Presence of aortocoronary bypass graft: Secondary | ICD-10-CM | POA: Diagnosis not present

## 2018-12-09 DIAGNOSIS — J9601 Acute respiratory failure with hypoxia: Secondary | ICD-10-CM | POA: Diagnosis not present

## 2018-12-09 DIAGNOSIS — J1000 Influenza due to other identified influenza virus with unspecified type of pneumonia: Principal | ICD-10-CM | POA: Diagnosis present

## 2018-12-09 DIAGNOSIS — I255 Ischemic cardiomyopathy: Secondary | ICD-10-CM | POA: Diagnosis not present

## 2018-12-09 DIAGNOSIS — Z820 Family history of epilepsy and other diseases of the nervous system: Secondary | ICD-10-CM

## 2018-12-09 DIAGNOSIS — R0600 Dyspnea, unspecified: Secondary | ICD-10-CM | POA: Diagnosis not present

## 2018-12-09 DIAGNOSIS — G4733 Obstructive sleep apnea (adult) (pediatric): Secondary | ICD-10-CM | POA: Diagnosis present

## 2018-12-09 DIAGNOSIS — Z888 Allergy status to other drugs, medicaments and biological substances status: Secondary | ICD-10-CM | POA: Diagnosis not present

## 2018-12-09 DIAGNOSIS — E119 Type 2 diabetes mellitus without complications: Secondary | ICD-10-CM | POA: Diagnosis not present

## 2018-12-09 DIAGNOSIS — N179 Acute kidney failure, unspecified: Secondary | ICD-10-CM | POA: Diagnosis not present

## 2018-12-09 DIAGNOSIS — Z809 Family history of malignant neoplasm, unspecified: Secondary | ICD-10-CM | POA: Diagnosis not present

## 2018-12-09 DIAGNOSIS — E785 Hyperlipidemia, unspecified: Secondary | ICD-10-CM | POA: Diagnosis not present

## 2018-12-09 DIAGNOSIS — C61 Malignant neoplasm of prostate: Secondary | ICD-10-CM | POA: Diagnosis present

## 2018-12-09 DIAGNOSIS — Z7902 Long term (current) use of antithrombotics/antiplatelets: Secondary | ICD-10-CM

## 2018-12-09 DIAGNOSIS — R918 Other nonspecific abnormal finding of lung field: Secondary | ICD-10-CM | POA: Diagnosis not present

## 2018-12-09 DIAGNOSIS — J9690 Respiratory failure, unspecified, unspecified whether with hypoxia or hypercapnia: Secondary | ICD-10-CM | POA: Diagnosis present

## 2018-12-09 DIAGNOSIS — Z794 Long term (current) use of insulin: Secondary | ICD-10-CM

## 2018-12-09 DIAGNOSIS — E1140 Type 2 diabetes mellitus with diabetic neuropathy, unspecified: Secondary | ICD-10-CM | POA: Diagnosis present

## 2018-12-09 DIAGNOSIS — Z7951 Long term (current) use of inhaled steroids: Secondary | ICD-10-CM

## 2018-12-09 LAB — CBC WITH DIFFERENTIAL/PLATELET
Abs Immature Granulocytes: 0.04 10*3/uL (ref 0.00–0.07)
BASOS PCT: 0 %
Basophils Absolute: 0 10*3/uL (ref 0.0–0.1)
Eosinophils Absolute: 0.1 10*3/uL (ref 0.0–0.5)
Eosinophils Relative: 1 %
HCT: 32.7 % — ABNORMAL LOW (ref 39.0–52.0)
Hemoglobin: 10.6 g/dL — ABNORMAL LOW (ref 13.0–17.0)
Immature Granulocytes: 0 %
Lymphocytes Relative: 8 %
Lymphs Abs: 0.9 10*3/uL (ref 0.7–4.0)
MCH: 29.9 pg (ref 26.0–34.0)
MCHC: 32.4 g/dL (ref 30.0–36.0)
MCV: 92.1 fL (ref 80.0–100.0)
Monocytes Absolute: 0.4 10*3/uL (ref 0.1–1.0)
Monocytes Relative: 4 %
NRBC: 0 % (ref 0.0–0.2)
Neutro Abs: 10 10*3/uL — ABNORMAL HIGH (ref 1.7–7.7)
Neutrophils Relative %: 87 %
Platelets: 186 10*3/uL (ref 150–400)
RBC: 3.55 MIL/uL — AB (ref 4.22–5.81)
RDW: 13.4 % (ref 11.5–15.5)
WBC: 11.4 10*3/uL — AB (ref 4.0–10.5)

## 2018-12-09 LAB — COMPREHENSIVE METABOLIC PANEL
ALBUMIN: 4.3 g/dL (ref 3.5–5.0)
ALK PHOS: 85 U/L (ref 38–126)
ALT: 50 U/L — ABNORMAL HIGH (ref 0–44)
AST: 48 U/L — ABNORMAL HIGH (ref 15–41)
Anion gap: 10 (ref 5–15)
BILIRUBIN TOTAL: 0.6 mg/dL (ref 0.3–1.2)
BUN: 46 mg/dL — ABNORMAL HIGH (ref 8–23)
CO2: 24 mmol/L (ref 22–32)
Calcium: 8.7 mg/dL — ABNORMAL LOW (ref 8.9–10.3)
Chloride: 100 mmol/L (ref 98–111)
Creatinine, Ser: 1.65 mg/dL — ABNORMAL HIGH (ref 0.61–1.24)
GFR calc Af Amer: 47 mL/min — ABNORMAL LOW (ref >60)
GFR, EST NON AFRICAN AMERICAN: 41 mL/min — AB (ref >60)
GLUCOSE: 122 mg/dL — AB (ref 70–99)
Potassium: 4.4 mmol/L (ref 3.5–5.1)
SODIUM: 134 mmol/L — AB (ref 135–145)
Total Protein: 8.3 g/dL — ABNORMAL HIGH (ref 6.5–8.1)

## 2018-12-09 LAB — PROTIME-INR
INR: 0.99
PROTHROMBIN TIME: 13 s (ref 11.4–15.2)

## 2018-12-09 LAB — INFLUENZA PANEL BY PCR (TYPE A & B)
INFLBPCR: POSITIVE — AB
Influenza A By PCR: NEGATIVE

## 2018-12-09 MED ORDER — IPRATROPIUM-ALBUTEROL 0.5-2.5 (3) MG/3ML IN SOLN
3.0000 mL | Freq: Four times a day (QID) | RESPIRATORY_TRACT | Status: DC
  Administered 2018-12-10 – 2018-12-12 (×8): 3 mL via RESPIRATORY_TRACT
  Filled 2018-12-09 (×9): qty 3

## 2018-12-09 MED ORDER — PIPERACILLIN-TAZOBACTAM 3.375 G IVPB 30 MIN
3.3750 g | Freq: Once | INTRAVENOUS | Status: AC
  Administered 2018-12-09: 3.375 g via INTRAVENOUS
  Filled 2018-12-09: qty 50

## 2018-12-09 MED ORDER — VANCOMYCIN HCL IN DEXTROSE 1-5 GM/200ML-% IV SOLN
1000.0000 mg | Freq: Once | INTRAVENOUS | Status: AC
  Administered 2018-12-09: 1000 mg via INTRAVENOUS
  Filled 2018-12-09: qty 200

## 2018-12-09 MED ORDER — BUDESONIDE 0.5 MG/2ML IN SUSP: 0.5000 mg | Freq: Two times a day (BID) | RESPIRATORY_TRACT | Status: DC

## 2018-12-09 MED ORDER — OSELTAMIVIR PHOSPHATE 75 MG PO CAPS
75.0000 mg | ORAL_CAPSULE | Freq: Once | ORAL | Status: AC
  Administered 2018-12-09: 75 mg via ORAL
  Filled 2018-12-09: qty 1

## 2018-12-09 MED ORDER — BUDESONIDE 0.25 MG/2ML IN SUSP
0.5000 mg | Freq: Two times a day (BID) | RESPIRATORY_TRACT | Status: DC
  Administered 2018-12-10 – 2018-12-12 (×5): 0.5 mg via RESPIRATORY_TRACT
  Filled 2018-12-09 (×5): qty 4

## 2018-12-09 NOTE — ED Provider Notes (Signed)
[facility] Emergency Department Provider Note       Time seen: ----------------------------------------- 9:45 PM on 12/09/2018 -----------------------------------------   I have reviewed the triage vital signs and the nursing notes.  HISTORY   Chief Complaint Shortness of Breath    HPI [name] is a 73 y.o. male with a history of anemia, pulmonary edema, cardiomyopathy, CHF, COPD, GERD, hyperlipidemia, hypertension who presents to the ED for shortness of breath for the past week.  Patient was seen at his doctor's office yesterday and told that he did not think it was anything.  Patient states symptoms became worse this afternoon.  He said nonproductive cough and chills.  He has also had some chest pain.  He denies vomiting or diarrhea.  Past Medical History:  Diagnosis Date  . Anemia   . Cancer ([place]) 12/2013   prostate  . Cardiogenic pulmonary edema ([place]) 12/19/2014  . Cardiomyopathy, ischemic   . CHF (congestive heart failure) ([place])   . COPD (chronic obstructive pulmonary disease) ([place])   . Diabetes mellitus without complication ([place])   . GERD (gastroesophageal reflux disease)   . Hypercholesteremia   . Hypertension   . Myocardial infarction ([place]) [id number]  . Shortness of breath dyspnea     Patient Active Problem List   Diagnosis Date Noted  . 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 Unstable angina ([place])   . Acute respiratory failure ([place]) 09/02/2018  . COPD exacerbation ([place]) 06/06/2018  . Acute on chronic systolic CHF (congestive heart failure) ([place]) 04/21/2017  . Hypokalemia 04/21/2017  . Hypomagnesemia 04/21/2017  . Essential hypertension 04/21/2017  . Hyperlipidemia 04/21/2017  . Coronary artery disease 04/21/2017  . COPD with chronic bronchitis ([place]) 02/13/2016  . Obstructive sleep apnea 02/13/2016  . Diabetes ([place]) 02/13/2016  . Chronic systolic HF (heart failure) ([place]) 12/12/2015    Past Surgical History:   Procedure Laterality Date  . CARDIAC CATHETERIZATION N/A 02/02/2016   Procedure: Left Heart Cath and Coronary Angiography;  Surgeon: [name], MD;  Location: [place] [facility];  Service: Cardiovascular;  Laterality: N/A;  . CORONARY ANGIOPLASTY WITH STENT PLACEMENT    . CORONARY ARTERY BYPASS GRAFT  11/24/2010  . CORONARY STENT INTERVENTION N/A 09/04/2018   Procedure: CORONARY STENT INTERVENTION;  Surgeon: [name], MD;  Location: [place] [facility];  Service: Cardiovascular;  Laterality: N/A;  . IMPLANTABLE CARDIOVERTER DEFIBRILLATOR (ICD) GENERATOR CHANGE Left 12/12/2015   Procedure: DUAL LEAD PLACEMENT CARDIAC DIFIBRILLATOR;  Surgeon: [name], MD;  Location: [facility];  Service: Cardiovascular;  Laterality: Left;  . LEFT HEART CATH AND CORS/GRAFTS ANGIOGRAPHY N/A 09/04/2018   Procedure: LEFT HEART CATH AND CORS/GRAFTS ANGIOGRAPHY;  Surgeon: [name], MD;  Location: [place] [facility];  Service: Cardiovascular;  Laterality: N/A;  . TONSILLECTOMY      Allergies Brilinta [ticagrelor]; Tuberculin tests; Benadryl [diphenhydramine]; Doxycycline; and Lopid [gemfibrozil]  Social History Social History   Tobacco Use  . Smoking status: Former Smoker    Packs/day: 2.00    Years: 30.00    Pack years: 60.00    Last attempt to quit: 12/03/1996    Years since quitting: 22.0  . Smokeless tobacco: Never Used  Substance Use Topics  . Alcohol use: No    Alcohol/week: 0.0 standard drinks  . Drug use: No   Review of Systems Constitutional: Negative for fever. Cardiovascular: Positive for chest pain Respiratory: Positive for shortness of breath and cough Gastrointestinal: Negative for abdominal pain, vomiting and  diarrhea. Musculoskeletal: Negative for back pain. Skin: Negative for rash. Neurological: Negative for headaches, positive for weakness  All systems negative/normal/unremarkable except as stated in the  HPI  ____________________________________________   PHYSICAL EXAM:  VITAL SIGNS: ED Triage Vitals  Enc Vitals Group     BP 12/09/18 2109 (!) 126/97     Pulse Rate 12/09/18 2109 98     Resp 12/09/18 2109 (!) 24     Temp 12/09/18 2109 99.8 F (37.7 C)     Temp Source 12/09/18 2109 Oral     SpO2 12/09/18 2109 93 %     Weight --      Height --      Head Circumference --      Peak Flow --      Pain Score 12/09/18 2105 4     Pain Loc --      Pain Edu? --      Excl. in [place]? --    Constitutional: Alert and oriented.  Mild to moderate distress Eyes: Conjunctivae are normal. Normal extraocular movements. ENT      Head: Normocephalic and atraumatic.      Nose: No congestion/rhinnorhea.      Mouth/Throat: Mucous membranes are moist.      Neck: No stridor. Cardiovascular: Normal rate, regular rhythm. No murmurs, rubs, or gallops. Respiratory: Tachypnea with diminished breath sounds and scattered rhonchi Gastrointestinal: Soft and nontender. Normal bowel sounds Musculoskeletal: Nontender with normal range of motion in extremities. No lower extremity tenderness nor edema. Neurologic:  Normal speech and language. No gross focal neurologic deficits are appreciated.  Skin:  Skin is warm, dry and intact. No rash noted. Psychiatric: Mood and affect are normal. Speech and behavior are normal.  ____________________________________________  EKG: Interpreted by me.  Sensing ventricular paced rhythm with a rate of 86 bpm, normal pacemaker function is noted  ____________________________________________  ED COURSE:  As part of my medical decision making, I reviewed the following data within the [facility] History obtained from family if available, nursing notes, old chart and ekg, as well as notes from prior ED visits. Patient presented for shortness of breath, we will assess with labs and imaging as indicated at this time.    Procedures ____________________________________________   LABS (pertinent positives/negatives)  Labs Reviewed  COMPREHENSIVE METABOLIC PANEL - Abnormal; Notable for the following components:      Result Value   Sodium 134 (*)    Glucose, Bld 122 (*)    BUN 46 (*)    Creatinine, Ser 1.65 (*)    Calcium 8.7 (*)    Total Protein 8.3 (*)    AST 48 (*)    ALT 50 (*)    GFR calc non Af Amer 41 (*)    GFR calc Af Amer 47 (*)    All other components within normal limits  CBC WITH DIFFERENTIAL/PLATELET - Abnormal; Notable for the following components:   WBC 11.4 (*)    RBC 3.55 (*)    Hemoglobin 10.6 (*)    HCT 32.7 (*)    Neutro Abs 10.0 (*)    All other components within normal limits  CULTURE, BLOOD (ROUTINE X 2)  CULTURE, BLOOD (ROUTINE X 2)  PROTIME-INR  URINALYSIS, COMPLETE (UACMP) WITH MICROSCOPIC  INFLUENZA PANEL BY PCR (TYPE A & B)  BLOOD GAS, VENOUS  I-STAT CG4 LACTIC ACID, ED  I-STAT CG4 LACTIC ACID, ED    RADIOLOGY Images were viewed by me  Chest x-ray IMPRESSION: Minimal enlargement of  cardiac silhouette post CABG and ICD.  RIGHT mid lung infiltrates likely representing pneumonia. ____________________________________________   DIFFERENTIAL DIAGNOSIS   COPD, CHF, pneumonia, PE, unstable angina  FINAL ASSESSMENT AND PLAN  Dyspnea, pneumonia, hypoxia   Plan: The patient had presented for shortness of breath. Patient's labs did reveal a mildly elevated lactic acid level and leukocytosis. Patient's imaging reflected right midlung infiltrates indicating pneumonia.  He was started on broad-spectrum antibiotics.  He currently is on 5 L of oxygen which he does not normally need.  He is normally on as needed oxygen.  We will discuss with the hospitalist for admission.   Laurence Aly, MD    Note: This note was generated in part or whole with voice recognition software. Voice recognition is usually quite accurate but there are transcription errors that  can and very often do occur. I apologize for any typographical errors that were not detected and corrected.     Earleen Newport, MD 12/09/18 2230

## 2018-12-09 NOTE — ED Triage Notes (Signed)
Patient reports symptoms for approximately a week.  Seen at MD office yesterday and told "didn't think it was anything".  States symptoms became worse this afternoon.  Patient with non productive cough noted in triage.

## 2018-12-09 NOTE — H&P (Signed)
[facility] [place] [facility] NAME: [name]    MR#:  [id number]  DATE OF BIRTH:  1946/08/24  DATE OF ADMISSION:  12/09/2018  PRIMARY CARE PHYSICIAN: [name], MD   REQUESTING/REFERRING PHYSICIAN:   CHIEF COMPLAINT:   Chief Complaint  Patient presents with  . Shortness of Breath    HISTORY OF PRESENT ILLNESS: [name]  is a 73 y.o. male with a known history per below presenting to the emergency room with 1 to 2-day history of worsening shortness of breath, generalized weakness, fatigue, productive cough, chills, chest tightness, wheezing, and emergency room patient was found to be tachypneic, sodium 134, creatinine 1.6, white count 11,000, chest x-ray noted for right-sided pneumonia, flu B+, patient evaluated emergency room, family at the bedside, patient now be admitted for acute hypoxic respiratory failure secondary to right-sided pneumonia, acute influenza B infection, and COPD exacerbation.  PAST MEDICAL HISTORY:   Past Medical History:  Diagnosis Date  . Anemia   . Cancer ([place]) 12/2013   prostate  . Cardiogenic pulmonary edema ([place]) 12/19/2014  . Cardiomyopathy, ischemic   . CHF (congestive heart failure) ([place])   . COPD (chronic obstructive pulmonary disease) ([place])   . Diabetes mellitus without complication ([place])   . GERD (gastroesophageal reflux disease)   . Hypercholesteremia   . Hypertension   . Myocardial infarction ([place]) [id number]  . Shortness of breath dyspnea     PAST SURGICAL HISTORY:  Past Surgical History:  Procedure Laterality Date  . CARDIAC CATHETERIZATION N/A 02/02/2016   Procedure: Left Heart Cath and Coronary Angiography;  Surgeon: [name], MD;  Location: [facility];  Service: Cardiovascular;  Laterality: N/A;  . CORONARY ANGIOPLASTY WITH STENT PLACEMENT    . CORONARY ARTERY BYPASS GRAFT  11/24/2010  . CORONARY STENT INTERVENTION N/A 09/04/2018   Procedure: CORONARY STENT  INTERVENTION;  Surgeon: [name], MD;  Location: [facility];  Service: Cardiovascular;  Laterality: N/A;  . IMPLANTABLE CARDIOVERTER DEFIBRILLATOR (ICD) GENERATOR CHANGE Left 12/12/2015   Procedure: DUAL LEAD PLACEMENT CARDIAC DIFIBRILLATOR;  Surgeon: [name], MD;  Location: [facility];  Service: Cardiovascular;  Laterality: Left;  . LEFT HEART CATH AND CORS/GRAFTS ANGIOGRAPHY N/A 09/04/2018   Procedure: LEFT HEART CATH AND CORS/GRAFTS ANGIOGRAPHY;  Surgeon: [name], MD;  Location: [facility];  Service: Cardiovascular;  Laterality: N/A;  . TONSILLECTOMY      SOCIAL HISTORY:  Social History   Tobacco Use  . Smoking status: Former Smoker    Packs/day: 2.00    Years: 30.00    Pack years: 60.00    Last attempt to quit: 12/03/1996    Years since quitting: 22.0  . Smokeless tobacco: Never Used  Substance Use Topics  . Alcohol use: No    Alcohol/week: 0.0 standard drinks    FAMILY HISTORY:  Family History  Problem Relation Age of Onset  . CAD Mother   . Cancer Mother   . Diabetes Mother   . Alzheimer's disease Father   . Cancer Father   . Heart disease Father     DRUG ALLERGIES:  Allergies  Allergen Reactions  . Brilinta [Ticagrelor] Shortness Of Breath  . Tuberculin Tests Rash  . Benadryl [Diphenhydramine] Other (See Comments)    " Hyperactivity"  . Doxycycline Swelling    Pt went into pulmonary edema.  . Lopid [Gemfibrozil] Swelling    "I gain 1 pound a day for 30 days."  REVIEW OF SYSTEMS:   CONSTITUTIONAL: + fever, fatigue, weakness.  EYES: No blurred or double vision.  EARS, NOSE, AND THROAT: No tinnitus or ear pain.  RESPIRATORY: + cough, shortness of breath, wheezing  CARDIOVASCULAR: + Tightness, no orthopnea, edema.  GASTROINTESTINAL: No nausea, vomiting, diarrhea or abdominal pain.  GENITOURINARY: No dysuria, hematuria.  ENDOCRINE: No polyuria, nocturia,  HEMATOLOGY: No anemia, easy bruising or bleeding SKIN: No  rash or lesion. MUSCULOSKELETAL: No joint pain or arthritis.   NEUROLOGIC: No tingling, numbness, weakness.  PSYCHIATRY: No anxiety or depression.   MEDICATIONS AT HOME:  Prior to Admission medications   Medication Sig Start Date End Date Taking? Authorizing Provider  acetaminophen (TYLENOL) 500 MG tablet Take 1,000 mg by mouth daily as needed for moderate pain.   Yes [provider]  albuterol (PROVENTIL HFA;VENTOLIN HFA) 108 (90 Base) MCG/ACT inhaler Inhale 2 puffs into the lungs every 6 (six) hours as needed for wheezing or shortness of breath. 06/04/18  Yes [name], [name], MD  allopurinol (ZYLOPRIM) 100 MG tablet Take 100 mg by mouth daily.   Yes [provider]  aspirin 81 MG chewable tablet Chew 1 tablet (81 mg total) by mouth daily. 09/06/18  Yes Mayo, Pete [name], MD  atorvastatin (LIPITOR) 80 MG tablet Take 1 tablet (80 mg total) by mouth every evening. 04/21/17  Yes Theodoro [name], MD  budesonide-formoterol (SYMBICORT) 80-4.5 MCG/ACT inhaler Inhale 2 puffs into the lungs 2 (two) times daily.   Yes [provider]  calcium carbonate (TUMS - DOSED IN MG ELEMENTAL CALCIUM) 500 MG chewable tablet Chew 1 tablet by mouth as needed for indigestion or heartburn.   Yes [provider]  carvedilol (COREG) 3.125 MG tablet Take 1 tablet (3.125 mg total) by mouth 2 (two) times daily with a meal. 01/01/18  Yes Gladstone [name], MD  clopidogrel (PLAVIX) 75 MG tablet Take 75 mg by mouth daily.   Yes [provider]  co-enzyme Q-10 30 MG capsule Take 30 mg by mouth daily.   Yes [provider]  ferrous sulfate 325 (65 FE) MG tablet Take 325 mg by mouth 2 (two) times daily with a meal.   Yes [provider]  FLUoxetine (PROZAC) 20 MG capsule Take 20 mg by mouth daily.   Yes [provider]  gabapentin (NEURONTIN) 300 MG capsule Take 300 mg by mouth 2 (two) times daily.    Yes [provider]  guaiFENesin (MUCINEX) 600 MG 12  hr tablet Take 600 mg by mouth 2 (two) times daily.   Yes [provider]  insulin glargine (LANTUS) 100 UNIT/ML injection Inject 0.6 mLs (60 Units total) into the skin at bedtime. Patient taking differently: Inject 60 Units into the skin at bedtime. Take 60-80 units at bedtime depending on what he has eaten. 01/01/18  Yes Gladstone [name], MD  insulin lispro (HUMALOG) 100 UNIT/ML KiwkPen Inject 0-15 Units into the skin 3 (three) times daily with meals. Per sliding scale   Yes [provider]  lansoprazole (PREVACID) 15 MG capsule Take 30 mg by mouth 2 (two) times daily.   Yes [provider]  loratadine (CLARITIN) 10 MG tablet Take 10 mg by mouth daily.   Yes [provider]  Magnesium Oxide 400 (240 Mg) MG TABS Take 1 tablet (400 mg total) by mouth 2 (two) times daily. 05/30/18  Yes Hackney, Otila Kluver A, FNP  Multiple Vitamins-Minerals (CENTRUM SILVER PO) Take 1 tablet by mouth every morning.   Yes [provider]  Multiple Vitamins-Minerals (PRESERVISION AREDS 2) CAPS Take 1 capsule by mouth 2 (two) times daily. For macular degeneration- eye vitamins   Yes [provider]  nitroGLYCERIN (NITROSTAT) 0.4 MG SL tablet Place 0.4 mg under the tongue every 5 (five) minutes as needed for chest pain.   Yes [provider]  ramipril (ALTACE) 2.5 MG capsule Take 2.5 mg by mouth daily.   Yes [provider]  ranolazine (RANEXA) 500 MG 12 hr tablet Take 1 tablet (500 mg total) by mouth 2 (two) times daily. 05/30/18  Yes Hackney, Otila Kluver A, FNP  spironolactone (ALDACTONE) 25 MG tablet Take 25 mg by mouth daily.    Yes [provider]  tamsulosin (FLOMAX) 0.4 MG CAPS capsule Take 0.4 mg by mouth at bedtime.    Yes [provider]  tiotropium (SPIRIVA) 18 MCG inhalation capsule Place 1 capsule (18 mcg total) into inhaler and inhale daily. 06/08/18  Yes Wieting, Richard, MD  torsemide (DEMADEX) 20 MG tablet Take 1 tablet (20 mg total)  by mouth 2 (two) times daily. 1 tab po twice a day.  For weight gain of 3 lbs in one day or five lbs in a week then increase to 2 tabs in am and one in afternoon 06/08/18  Yes Wieting, Richard, MD      PHYSICAL EXAMINATION:   VITAL SIGNS: Blood pressure (!) 126/97, pulse 98, temperature 99.8 F (37.7 C), temperature source Oral, resp. rate (!) 24, SpO2 93 %.  GENERAL:  74 y.o.-year-old patient lying in the bed with no acute distress.  Extreme morbid obesity EYES: Pupils equal, round, reactive to light and accommodation. No scleral icterus. Extraocular muscles intact.  HEENT: Head atraumatic, normocephalic. Oropharynx and nasopharynx clear.  NECK:  Supple, no jugular venous distention. No thyroid enlargement, no tenderness.  LUNGS: Bilateral rhonchi/wheezing with diminished breath sounds throughout. No use of accessory muscles of respiration.  CARDIOVASCULAR: S1, S2 normal. No murmurs, rubs, or gallops.  ABDOMEN: Soft, nontender, nondistended. Bowel sounds present. No organomegaly or mass.  EXTREMITIES: No pedal edema, cyanosis, or clubbing.  NEUROLOGIC: Cranial nerves II through XII are intact. MAES. Gait not checked.  PSYCHIATRIC: The patient is alert and oriented x 3.  SKIN: No obvious rash, lesion, or ulcer.   LABORATORY PANEL:   CBC Recent Labs  Lab 12/09/18 2125  WBC 11.4*  HGB 10.6*  HCT 32.7*  PLT 186  MCV 92.1  MCH 29.9  MCHC 32.4  RDW 13.4  LYMPHSABS 0.9  MONOABS 0.4  EOSABS 0.1  BASOSABS 0.0   ------------------------------------------------------------------------------------------------------------------  Chemistries  Recent Labs  Lab 12/09/18 2125  NA 134*  K 4.4  CL 100  CO2 24  GLUCOSE 122*  BUN 46*  CREATININE 1.65*  CALCIUM 8.7*  AST 48*  ALT 50*  ALKPHOS 85  BILITOT 0.6   ------------------------------------------------------------------------------------------------------------------ CrCl cannot be calculated (Unknown ideal  weight.). ------------------------------------------------------------------------------------------------------------------ No results for input(s): TSH, T4TOTAL, T3FREE, THYROIDAB in the last 72 hours.  Invalid input(s): FREET3   Coagulation profile Recent Labs  Lab 12/09/18 2125  INR 0.99   ------------------------------------------------------------------------------------------------------------------- No results for input(s): DDIMER in the last 72 hours. -------------------------------------------------------------------------------------------------------------------  Cardiac Enzymes No results for input(s): CKMB, TROPONINI, MYOGLOBIN in the last 168 hours.  Invalid input(s): CK ------------------------------------------------------------------------------------------------------------------ Invalid input(s): POCBNP  ---------------------------------------------------------------------------------------------------------------  Urinalysis    Component Value Date/Time   COLORURINE YELLOW (A) 11/22/2017 1756   APPEARANCEUR CLEAR (A) 11/22/2017 1756   APPEARANCEUR Hazy 11/26/2012 2236   LABSPEC 1.008 11/22/2017 1756   LABSPEC  1.025 11/26/2012 2236   PHURINE 5.0 11/22/2017 1756   GLUCOSEU NEGATIVE 11/22/2017 1756   GLUCOSEU Negative 11/26/2012 2236   HGBUR NEGATIVE 11/22/2017 1756   BILIRUBINUR NEGATIVE 11/22/2017 1756   BILIRUBINUR Negative 11/26/2012 2236   KETONESUR NEGATIVE 11/22/2017 1756   PROTEINUR NEGATIVE 11/22/2017 1756   NITRITE NEGATIVE 11/22/2017 1756   LEUKOCYTESUR NEGATIVE 11/22/2017 1756   LEUKOCYTESUR Negative 11/26/2012 2236     RADIOLOGY: Dg Chest 2 View  Result Date: 12/09/2018 CLINICAL DATA:  Shortness of breath and cough for 1 week, history of prostate cancer, CHF, coronary artery disease post MI and CABG, ischemic cardiomyopathy, COPD, diabetes mellitus, hypertension EXAM: CHEST - 2 VIEW COMPARISON:  09/10/2018 FINDINGS: LEFT subclavian AICD  with leads projecting at RIGHT atrium, RIGHT ventricle, and coronary sinus, unchanged. Minimal enlargement of cardiac silhouette post CABG. Mediastinal contours and pulmonary vascularity normal. Patchy infiltrates in the mid RIGHT lung question pneumonia, predominantly RIGHT upper lobe. Remaining lungs clear. No pleural effusion or pneumothorax. Scattered endplate spur formation thoracic spine. IMPRESSION: Minimal enlargement of cardiac silhouette post CABG and ICD. RIGHT mid lung infiltrates likely representing pneumonia. Electronically Signed   By: Lavonia Dana M.D.   On: 12/09/2018 21:34    EKG: Orders placed or performed during the hospital encounter of 12/09/18  . ED EKG  . ED EKG  . EKG 12-Lead  . EKG 12-Lead    IMPRESSION AND PLAN: *Acute hypoxic respiratory failure Secondary to multifactorial process that includes acute right-sided pneumonia, acute influenza B, and COPD exacerbation Supplemental oxygen wean as tolerated  *Acute influenza B infection Tamiflu twice daily for 5-day course and supportive care  *Acute right-sided community-acquired pneumonia Pneumonia protocol, empiric Rocephin/azithromycin, follow-up on cultures  *Acute on COPD exacerbation Empiric IV Solu-Medrol with tapering as tolerated, inhaled corticosteroids twice daily, aggressive pulmonary toilet bronchodilator therapy, mucolytic agents, antibiotics per above, respiratory therapy to see, follow-up on cultures, wean O2 off as tolerated, continue close medical monitoring  *Chronic obstructive sleep apnea CPAP at bedtime/as needed  *Chronic diabetes mellitus type 2 Continue home regiment, sliding scale insulin with Accu-Cheks per routine  *Chronic systolic congestive heart failure with ischemic cardiomyopathy without exacerbation Stable on current regiment  All the records are reviewed and case discussed with ED provider. Management plans discussed with the patient, family and they are in agreement.  CODE  STATUS:full Code Status History    Date Active Date Inactive Code Status Order ID Comments User Context   09/03/2018 0037 09/05/2018 1912 Full Code [id number]  Amelia Jo, MD Inpatient   06/06/2018 1529 06/08/2018 1410 Full Code [id number]  Epifanio Lesches, MD ED   02/18/2018 1248 02/19/2018 1854 Full Code [id number]  Idelle Crouch, MD Inpatient   12/30/2017 0305 01/01/2018 1609 Full Code [id number]  Harrie Foreman, MD ED   12/05/2017 1105 12/06/2017 1659 Full Code [id number]  Harrie Foreman, MD ED   07/17/2017 0439 07/18/2017 1759 Full Code [id number]  Harrie Foreman, MD Inpatient   07/01/2017 0119 07/02/2017 1546 Full Code [id number]  Lance Coon, MD Inpatient   04/20/2017 0232 04/21/2017 1816 Full Code [id number]  Saundra Shelling, MD Inpatient   01/31/2016 0143 02/02/2016 2111 Full Code [id number]  Hillary Bow, MD ED   12/12/2015 1627 12/13/2015 1529 Full Code [id number]  [name], MD Inpatient    Advance Directive Documentation     Most Recent Value  Type of Advance Directive  Healthcare Power of Attorney, Living will  Pre-existing out of facility DNR order (yellow form or  pink MOST form)  -  "MOST" Form in Place?  -       TOTAL TIME TAKING CARE OF THIS PATIENT: 40 minutes.    Avel Peace Knolan Simien M.D on 12/09/2018   Between 7am to 6pm - Pager - [phone]  After 6pm go to [email or web address] - password [email or web address]  Office  [phone]  CC: Primary care physician; [name], MD   Note: This dictation was prepared with Dragon dictation along with smaller phrase technology. Any transcriptional errors that result from this process are unintentional.

## 2018-12-09 NOTE — Progress Notes (Signed)
Family Meeting Note  Advance Directive:yes  Today a meeting took place with the Patient.  Patient is able to participate   The following clinical team members were present during this meeting:MD  The following were discussed:Patient's diagnosis: Respiratory failure, Patient's progosis: Unable to determine and Goals for treatment: Full Code  Additional follow-up to be provided: prn  Time spent during discussion:20 minutes  Daniel D Salary, MD  

## 2018-12-09 NOTE — ED Notes (Signed)
ED TO INPATIENT HANDOFF REPORT  Name/Age/Gender [name] 73 y.o. male  Code Status Code Status History    Date Active Date Inactive Code Status Order ID Comments User Context   09/03/2018 0037 09/05/2018 1912 Full Code [id number]  Amelia Jo, MD Inpatient   06/06/2018 1529 06/08/2018 1410 Full Code [id number]  Epifanio Lesches, MD ED   02/18/2018 1248 02/19/2018 1854 Full Code [id number]  Idelle Crouch, MD Inpatient   12/30/2017 0305 01/01/2018 1609 Full Code [id number]  Harrie Foreman, MD ED   12/05/2017 1105 12/06/2017 1659 Full Code [id number]  Harrie Foreman, MD ED   07/17/2017 0439 07/18/2017 1759 Full Code [id number]  Harrie Foreman, MD Inpatient   07/01/2017 0119 07/02/2017 1546 Full Code [id number]  Lance Coon, MD Inpatient   04/20/2017 0232 04/21/2017 1816 Full Code [id number]  Saundra Shelling, MD Inpatient   01/31/2016 0143 02/02/2016 2111 Full Code [id number]  Hillary Bow, MD ED   12/12/2015 1627 12/13/2015 1529 Full Code [id number]  [name], MD Inpatient    Advance Directive Documentation     Most Recent Value  Type of Advance Directive  Healthcare Power of Attorney, Living will  Pre-existing out of facility DNR order (yellow form or pink MOST form)  -  "MOST" Form in Place?  -      Home/SNF/Other Home  Chief Complaint SOB  Level of Care/Admitting Diagnosis ED Disposition    ED Disposition Condition [place]: [place] [[id number]]  Level of Care: Med-Surg [16]  Diagnosis: Respiratory failure [facility]) [101751]  Admitting Physician: Gorden Harms [[id number]]  Attending Physician: Gorden Harms [[id number]]  Estimated length of stay: past midnight tomorrow  Certification:: I certify this patient will need inpatient services for at least 2 midnights  PT Class (Do Not Modify): Inpatient [101]  PT Acc Code (Do Not Modify): Private [1]       Medical History Past Medical History:  Diagnosis Date  . Anemia    . Cancer ([place]) 12/2013   prostate  . Cardiogenic pulmonary edema ([place]) 12/19/2014  . Cardiomyopathy, ischemic   . CHF (congestive heart failure) ([place])   . COPD (chronic obstructive pulmonary disease) ([place])   . Diabetes mellitus without complication ([place])   . GERD (gastroesophageal reflux disease)   . Hypercholesteremia   . Hypertension   . Myocardial infarction ([place]) [id number]  . Shortness of breath dyspnea     Allergies Allergies  Allergen Reactions  . Brilinta [Ticagrelor] Shortness Of Breath  . Tuberculin Tests Rash  . Benadryl [Diphenhydramine] Other (See Comments)    " Hyperactivity"  . Doxycycline Swelling    Pt went into pulmonary edema.  . Lopid [Gemfibrozil] Swelling    "I gain 1 pound a day for 30 days."    IV Location/Drains/Wounds Patient Lines/Drains/Airways Status   Active Line/Drains/Airways    Name:   Placement date:   Placement time:   Site:   Days:   Peripheral IV 12/09/18 Right Antecubital   12/09/18    2155    Antecubital   less than 1          Labs/Imaging Results for orders placed or performed during the hospital encounter of 12/09/18 (from the past 48 hour(s))  Comprehensive metabolic panel     Status: Abnormal   Collection Time: 12/09/18  9:25 PM  Result Value Ref Range   Sodium 134 (L) 135 - 145 mmol/L   Potassium 4.4 3.5 - 5.1 mmol/L  Chloride 100 98 - 111 mmol/L   CO2 24 22 - 32 mmol/L   Glucose, Bld 122 (H) 70 - 99 mg/dL   BUN 46 (H) 8 - 23 mg/dL   Creatinine, Ser 1.65 (H) 0.61 - 1.24 mg/dL   Calcium 8.7 (L) 8.9 - 10.3 mg/dL   Total Protein 8.3 (H) 6.5 - 8.1 g/dL   Albumin 4.3 3.5 - 5.0 g/dL   AST 48 (H) 15 - 41 U/L   ALT 50 (H) 0 - 44 U/L   Alkaline Phosphatase 85 38 - 126 U/L   Total Bilirubin 0.6 0.3 - 1.2 mg/dL   GFR calc non Af Amer 41 (L) >60 mL/min   GFR calc Af Amer 47 (L) >60 mL/min   Anion gap 10 5 - 15    Comment: Performed at [facility], [address] [place], [place] [address]  CBC with  Differential     Status: Abnormal   Collection Time: 12/09/18  9:25 PM  Result Value Ref Range   WBC 11.4 (H) 4.0 - 10.5 K/uL   RBC 3.55 (L) 4.22 - 5.81 MIL/uL   Hemoglobin 10.6 (L) 13.0 - 17.0 g/dL   HCT 32.7 (L) 39.0 - 52.0 %   MCV 92.1 80.0 - 100.0 fL   MCH 29.9 26.0 - 34.0 pg   MCHC 32.4 30.0 - 36.0 g/dL   RDW 13.4 11.5 - 15.5 %   Platelets 186 150 - 400 K/uL   nRBC 0.0 0.0 - 0.2 %   Neutrophils Relative % 87 %   Neutro Abs 10.0 (H) 1.7 - 7.7 K/uL   Lymphocytes Relative 8 %   Lymphs Abs 0.9 0.7 - 4.0 K/uL   Monocytes Relative 4 %   Monocytes Absolute 0.4 0.1 - 1.0 K/uL   Eosinophils Relative 1 %   Eosinophils Absolute 0.1 0.0 - 0.5 K/uL   Basophils Relative 0 %   Basophils Absolute 0.0 0.0 - 0.1 K/uL   Immature Granulocytes 0 %   Abs Immature Granulocytes 0.04 0.00 - 0.07 K/uL    Comment: Performed at [facility], [address] [place], [place] [address]  Protime-INR     Status: None   Collection Time: 12/09/18  9:25 PM  Result Value Ref Range   Prothrombin Time 13.0 11.4 - 15.2 seconds   INR 0.99     Comment: Performed at [facility], [address] [place], [place] [address]  Influenza panel by PCR (type A & B)     Status: Abnormal   Collection Time: 12/09/18  9:25 PM  Result Value Ref Range   Influenza A By PCR NEGATIVE NEGATIVE   Influenza B By PCR POSITIVE (A) NEGATIVE    Comment: (NOTE) The Xpert Xpress Flu assay is intended as an aid in the diagnosis of  influenza and should not be used as a sole basis for treatment.  This  assay is FDA approved for nasopharyngeal swab specimens only. Nasal  washings and aspirates are unacceptable for Xpert Xpress Flu testing. Performed at [facility], [address] [place],  [address]   Blood gas, venous     Status: Abnormal (Preliminary result)   Collection Time: 12/09/18 10:32 PM  Result Value Ref Range   pH, Ven 7.43 7.250 - 7.430   pCO2, Ven 38 (L) 44.0 - 60.0 mmHg   pO2,  Ven PENDING 32.0 - 45.0 mmHg   Bicarbonate 25.2 20.0 - 28.0 mmol/L   Acid-Base Excess 1.0 0.0 -  2.0 mmol/L   O2 Saturation 20.8 %   Patient temperature 37.0    Collection site LINE    Sample type VENOUS     Comment: Performed at [facility] [address] [place], [place] [address]   Dg Chest 2 View  Result Date: 12/09/2018 CLINICAL DATA:  Shortness of breath and cough for 1 week, history of prostate cancer, CHF, 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 Minimal enlargement of cardiac silhouette post CABG. Mediastinal contours and pulmonary vascularity normal. Patchy infiltrates in the mid RIGHT lung question pneumonia, predominantly RIGHT upper lobe. Remaining lungs clear. No pleural effusion or pneumothorax. Scattered endplate spur formation thoracic spine. IMPRESSION: Minimal enlargement of cardiac silhouette post CABG and ICD. RIGHT mid lung infiltrates likely representing pneumonia. Electronically Signed   By: Lavonia Dana M.D.   On: 12/09/2018 21:34   None  Pending Labs Unresulted Labs (From admission, onward)    Start     Ordered   12/09/18 2111  Culture, blood (Routine x 2)  BLOOD CULTURE X 2,   STAT     12/09/18 2111   12/09/18 2111  Urinalysis, Complete w Microscopic  ONCE - STAT,   STAT     12/09/18 2111   Signed and Held  Culture, blood (routine x 2) Call MD if unable to obtain prior to antibiotics being given  BLOOD CULTURE X 2,   R    Comments:  If blood cultures drawn in Emergency Department - Do not draw and cancel order    Signed and Held   Signed and Held  Culture, sputum-assessment  Once,   R     Signed and Held   Signed and Held  Gram stain  Once,   R     Signed and Held   Signed and Held  HIV antibody (Routine Screening)  Once,   R      Signed and Held   Signed and Held  CBC  (enoxaparin (LOVENOX)    CrCl >/= 30 ml/min)  Once,   R    Comments:  Baseline for enoxaparin therapy IF NOT ALREADY DRAWN.  Notify MD if PLT < 100 K.    Signed and Held   Signed and Held  Creatinine, serum  (enoxaparin (LOVENOX)    CrCl >/= 30 ml/min)  Once,   R    Comments:  Baseline for enoxaparin therapy IF NOT ALREADY DRAWN.    Signed and Held   Signed and Held  Creatinine, serum  (enoxaparin (LOVENOX)    CrCl >/= 30 ml/min)  Weekly,   R    Comments:  while on enoxaparin therapy    Signed and Held   Signed and Held  Strep pneumoniae urinary antigen  Once,   R     Signed and Held   Signed and Held  Legionella Pneumophila Serogp 1 Ur Ag  Once,   R     Signed and Held          Vitals/Pain Today's Vitals   12/09/18 2105 12/09/18 2109  BP:  (!) 126/97  Pulse:  98  Resp:  (!) 24  Temp:  99.8 F (37.7 C)  TempSrc:  Oral  SpO2:  93%  PainSc: 4      Isolation Precautions Droplet precaution  Medications Medications  vancomycin (VANCOCIN)  IVPB 1000 mg/200 mL premix (1,000 mg Intravenous New Bag/Given 12/09/18 2252)  ipratropium-albuterol (DUONEB) 0.5-2.5 (3) MG/3ML nebulizer solution 3 mL (has no administration in time range)  budesonide (PULMICORT) nebulizer solution 0.5 mg (has no administration in time range)  piperacillin-tazobactam (ZOSYN) IVPB 3.375 g (0 g Intravenous Stopped 12/09/18 2252)  oseltamivir (TAMIFLU) capsule 75 mg (75 mg Oral Given 12/09/18 2310)    Mobility walks

## 2018-12-09 NOTE — ED Notes (Signed)
I-stat lactic= 2.14  (RN/ MD notified)

## 2018-12-09 NOTE — ED Notes (Signed)
1 set of blood cultures, 1 blue/lav and green top blood specimen and 1 flu swab sent to lab by this EDT

## 2018-12-10 LAB — CBC
HCT: 26.3 % — ABNORMAL LOW (ref 39.0–52.0)
Hemoglobin: 8.7 g/dL — ABNORMAL LOW (ref 13.0–17.0)
MCH: 29.7 pg (ref 26.0–34.0)
MCHC: 33.1 g/dL (ref 30.0–36.0)
MCV: 89.8 fL (ref 80.0–100.0)
Platelets: 137 10*3/uL — ABNORMAL LOW (ref 150–400)
RBC: 2.93 MIL/uL — ABNORMAL LOW (ref 4.22–5.81)
RDW: 13.5 % (ref 11.5–15.5)
WBC: 11.2 10*3/uL — ABNORMAL HIGH (ref 4.0–10.5)
nRBC: 0 % (ref 0.0–0.2)

## 2018-12-10 LAB — GLUCOSE, CAPILLARY
GLUCOSE-CAPILLARY: 182 mg/dL — AB (ref 70–99)
Glucose-Capillary: 154 mg/dL — ABNORMAL HIGH (ref 70–99)
Glucose-Capillary: 158 mg/dL — ABNORMAL HIGH (ref 70–99)
Glucose-Capillary: 225 mg/dL — ABNORMAL HIGH (ref 70–99)
Glucose-Capillary: 197 mg/dL — ABNORMAL HIGH (ref 70–99)

## 2018-12-10 LAB — URINALYSIS, COMPLETE (UACMP) WITH MICROSCOPIC
BILIRUBIN URINE: NEGATIVE
Bacteria, UA: NONE SEEN
Glucose, UA: NEGATIVE mg/dL
Hgb urine dipstick: NEGATIVE
KETONES UR: NEGATIVE mg/dL
Leukocytes, UA: NEGATIVE
Nitrite: NEGATIVE
Protein, ur: NEGATIVE mg/dL
Specific Gravity, Urine: 1.014 (ref 1.005–1.030)
Squamous Epithelial / HPF: NONE SEEN (ref 0–5)
pH: 5 (ref 5.0–8.0)

## 2018-12-10 LAB — CG4 I-STAT (LACTIC ACID): Lactic Acid, Venous: 2.14 mmol/L (ref 0.5–1.9)

## 2018-12-10 LAB — LACTIC ACID, PLASMA: LACTIC ACID, VENOUS: 1.9 mmol/L (ref 0.5–1.9)

## 2018-12-10 LAB — CREATININE, SERUM
CREATININE: 1.8 mg/dL — AB (ref 0.61–1.24)
GFR calc Af Amer: 42 mL/min — ABNORMAL LOW (ref >60)
GFR calc non Af Amer: 37 mL/min — ABNORMAL LOW (ref >60)

## 2018-12-10 MED ORDER — SPIRONOLACTONE 25 MG PO TABS
25.0000 mg | ORAL_TABLET | Freq: Every day | ORAL | Status: DC
  Administered 2018-12-11: 25 mg via ORAL
  Filled 2018-12-10 (×2): qty 1

## 2018-12-10 MED ORDER — PANTOPRAZOLE SODIUM 40 MG PO TBEC
40.0000 mg | DELAYED_RELEASE_TABLET | Freq: Every day | ORAL | Status: DC
  Administered 2018-12-10 – 2018-12-12 (×3): 40 mg via ORAL
  Filled 2018-12-10 (×3): qty 1

## 2018-12-10 MED ORDER — SODIUM CHLORIDE 0.9 % IV SOLN
500.0000 mg | INTRAVENOUS | Status: DC
  Administered 2018-12-10: 02:00:00 500 mg via INTRAVENOUS
  Filled 2018-12-10 (×2): qty 500

## 2018-12-10 MED ORDER — SODIUM CHLORIDE 0.9 % IV SOLN
1.0000 g | INTRAVENOUS | Status: DC
  Administered 2018-12-11 – 2018-12-12 (×2): 1 g via INTRAVENOUS
  Filled 2018-12-10: qty 10
  Filled 2018-12-10: qty 1
  Filled 2018-12-10 (×2): qty 10

## 2018-12-10 MED ORDER — FLUOXETINE HCL 20 MG PO CAPS
20.0000 mg | ORAL_CAPSULE | Freq: Every day | ORAL | Status: DC
  Administered 2018-12-10 – 2018-12-12 (×3): 20 mg via ORAL
  Filled 2018-12-10 (×3): qty 1

## 2018-12-10 MED ORDER — ALLOPURINOL 100 MG PO TABS
100.0000 mg | ORAL_TABLET | Freq: Every day | ORAL | Status: DC
  Administered 2018-12-10 – 2018-12-12 (×3): 100 mg via ORAL
  Filled 2018-12-10 (×3): qty 1

## 2018-12-10 MED ORDER — ENOXAPARIN SODIUM 40 MG/0.4ML ~~LOC~~ SOLN
40.0000 mg | SUBCUTANEOUS | Status: DC
  Administered 2018-12-10: 09:00:00 40 mg via SUBCUTANEOUS
  Filled 2018-12-10: qty 0.4

## 2018-12-10 MED ORDER — NITROGLYCERIN 0.4 MG SL SUBL: 0.4000 mg | SUBLINGUAL_TABLET | SUBLINGUAL | Status: DC | PRN

## 2018-12-10 MED ORDER — TORSEMIDE 20 MG PO TABS
20.0000 mg | ORAL_TABLET | Freq: Two times a day (BID) | ORAL | Status: DC
  Administered 2018-12-10 – 2018-12-11 (×3): 20 mg via ORAL
  Filled 2018-12-10 (×4): qty 1

## 2018-12-10 MED ORDER — TAMSULOSIN HCL 0.4 MG PO CAPS
0.4000 mg | ORAL_CAPSULE | Freq: Every day | ORAL | Status: DC
  Administered 2018-12-10 – 2018-12-11 (×3): 0.4 mg via ORAL
  Filled 2018-12-10 (×3): qty 1

## 2018-12-10 MED ORDER — ENOXAPARIN SODIUM 40 MG/0.4ML ~~LOC~~ SOLN
40.0000 mg | SUBCUTANEOUS | Status: DC
  Administered 2018-12-11 – 2018-12-12 (×2): 40 mg via SUBCUTANEOUS
  Filled 2018-12-10 (×2): qty 0.4

## 2018-12-10 MED ORDER — INSULIN GLARGINE 100 UNIT/ML ~~LOC~~ SOLN
80.0000 [IU] | Freq: Every day | SUBCUTANEOUS | Status: DC
  Administered 2018-12-11: 80 [IU] via SUBCUTANEOUS
  Filled 2018-12-10 (×4): qty 0.8

## 2018-12-10 MED ORDER — CLOPIDOGREL BISULFATE 75 MG PO TABS
75.0000 mg | ORAL_TABLET | Freq: Every day | ORAL | Status: DC
  Administered 2018-12-10 – 2018-12-12 (×3): 75 mg via ORAL
  Filled 2018-12-10 (×3): qty 1

## 2018-12-10 MED ORDER — CENTRUM SILVER PO CHEW: CHEWABLE_TABLET | ORAL | Status: DC

## 2018-12-10 MED ORDER — RAMIPRIL 2.5 MG PO CAPS
2.5000 mg | ORAL_CAPSULE | Freq: Every day | ORAL | Status: DC
  Administered 2018-12-11: 2.5 mg via ORAL
  Filled 2018-12-10 (×2): qty 1

## 2018-12-10 MED ORDER — GUAIFENESIN ER 600 MG PO TB12
600.0000 mg | ORAL_TABLET | Freq: Two times a day (BID) | ORAL | Status: DC
  Administered 2018-12-10 – 2018-12-12 (×6): 600 mg via ORAL
  Filled 2018-12-10 (×6): qty 1

## 2018-12-10 MED ORDER — RANOLAZINE ER 500 MG PO TB12
500.0000 mg | ORAL_TABLET | Freq: Two times a day (BID) | ORAL | Status: DC
  Administered 2018-12-10 – 2018-12-12 (×4): 500 mg via ORAL
  Filled 2018-12-10 (×8): qty 1

## 2018-12-10 MED ORDER — ADULT MULTIVITAMIN W/MINERALS CH
1.0000 | ORAL_TABLET | Freq: Every day | ORAL | Status: DC
  Administered 2018-12-10 – 2018-12-12 (×3): 1 via ORAL
  Filled 2018-12-10 (×3): qty 1

## 2018-12-10 MED ORDER — CARVEDILOL 3.125 MG PO TABS
3.1250 mg | ORAL_TABLET | Freq: Two times a day (BID) | ORAL | Status: DC
  Administered 2018-12-10 – 2018-12-11 (×3): 3.125 mg via ORAL
  Filled 2018-12-10 (×6): qty 1

## 2018-12-10 MED ORDER — INSULIN LISPRO 100 UNIT/ML (KWIKPEN): 15.0000 [IU] | PEN_INJECTOR | Freq: Three times a day (TID) | SUBCUTANEOUS | Status: DC

## 2018-12-10 MED ORDER — METHYLPREDNISOLONE SODIUM SUCC 125 MG IJ SOLR
60.0000 mg | Freq: Two times a day (BID) | INTRAMUSCULAR | Status: DC
  Administered 2018-12-10 – 2018-12-12 (×4): 60 mg via INTRAVENOUS
  Filled 2018-12-10 (×5): qty 2

## 2018-12-10 MED ORDER — OSELTAMIVIR PHOSPHATE 30 MG PO CAPS
30.0000 mg | ORAL_CAPSULE | Freq: Two times a day (BID) | ORAL | Status: DC
  Administered 2018-12-10 – 2018-12-12 (×4): 30 mg via ORAL
  Filled 2018-12-10 (×6): qty 1

## 2018-12-10 MED ORDER — INSULIN ASPART 100 UNIT/ML ~~LOC~~ SOLN: 0.0000 [IU] | Freq: Every day | SUBCUTANEOUS | Status: DC

## 2018-12-10 MED ORDER — MAGNESIUM OXIDE 400 (241.3 MG) MG PO TABS
400.0000 mg | ORAL_TABLET | Freq: Two times a day (BID) | ORAL | Status: DC
  Administered 2018-12-10 – 2018-12-12 (×6): 400 mg via ORAL
  Filled 2018-12-10 (×6): qty 1

## 2018-12-10 MED ORDER — SODIUM CHLORIDE 0.9 % IV SOLN
500.0000 mg | INTRAVENOUS | Status: DC
  Administered 2018-12-11 – 2018-12-12 (×2): 500 mg via INTRAVENOUS
  Filled 2018-12-10 (×4): qty 500

## 2018-12-10 MED ORDER — FERROUS SULFATE 325 (65 FE) MG PO TABS
325.0000 mg | ORAL_TABLET | Freq: Two times a day (BID) | ORAL | Status: DC
  Administered 2018-12-10 – 2018-12-12 (×5): 325 mg via ORAL
  Filled 2018-12-10 (×5): qty 1

## 2018-12-10 MED ORDER — METHYLPREDNISOLONE SODIUM SUCC 125 MG IJ SOLR
60.0000 mg | Freq: Four times a day (QID) | INTRAMUSCULAR | Status: DC
  Administered 2018-12-10 (×2): 60 mg via INTRAVENOUS
  Filled 2018-12-10 (×2): qty 2

## 2018-12-10 MED ORDER — LORATADINE 10 MG PO TABS
10.0000 mg | ORAL_TABLET | Freq: Every day | ORAL | Status: DC
  Administered 2018-12-10 – 2018-12-12 (×3): 10 mg via ORAL
  Filled 2018-12-10 (×3): qty 1

## 2018-12-10 MED ORDER — CALCIUM CARBONATE ANTACID 500 MG PO CHEW: 1.0000 | CHEWABLE_TABLET | ORAL | Status: DC | PRN

## 2018-12-10 MED ORDER — GABAPENTIN 300 MG PO CAPS
300.0000 mg | ORAL_CAPSULE | Freq: Two times a day (BID) | ORAL | Status: DC
  Administered 2018-12-10 – 2018-12-12 (×6): 300 mg via ORAL
  Filled 2018-12-10 (×6): qty 1

## 2018-12-10 MED ORDER — ACETAMINOPHEN 500 MG PO TABS
1000.0000 mg | ORAL_TABLET | Freq: Every day | ORAL | Status: DC | PRN
  Administered 2018-12-10: 01:00:00 1000 mg via ORAL

## 2018-12-10 MED ORDER — SODIUM CHLORIDE 0.9 % IV SOLN
1.0000 g | INTRAVENOUS | Status: DC
  Administered 2018-12-10: 03:00:00 1 g via INTRAVENOUS
  Filled 2018-12-10 (×2): qty 10

## 2018-12-10 MED ORDER — ATORVASTATIN CALCIUM 20 MG PO TABS
80.0000 mg | ORAL_TABLET | Freq: Every evening | ORAL | Status: DC
  Administered 2018-12-10 – 2018-12-11 (×2): 80 mg via ORAL
  Filled 2018-12-10 (×2): qty 4

## 2018-12-10 MED ORDER — OSELTAMIVIR PHOSPHATE 75 MG PO CAPS
75.0000 mg | ORAL_CAPSULE | Freq: Two times a day (BID) | ORAL | Status: DC
  Administered 2018-12-10: 09:00:00 75 mg via ORAL
  Filled 2018-12-10 (×3): qty 1

## 2018-12-10 MED ORDER — INSULIN ASPART 100 UNIT/ML ~~LOC~~ SOLN
15.0000 [IU] | Freq: Three times a day (TID) | SUBCUTANEOUS | Status: DC
  Administered 2018-12-10 – 2018-12-12 (×6): 15 [IU] via SUBCUTANEOUS
  Filled 2018-12-10 (×7): qty 1

## 2018-12-10 MED ORDER — ASPIRIN 81 MG PO CHEW
81.0000 mg | CHEWABLE_TABLET | Freq: Every day | ORAL | Status: DC
  Administered 2018-12-10 – 2018-12-12 (×3): 81 mg via ORAL
  Filled 2018-12-10 (×3): qty 1

## 2018-12-10 MED ORDER — INSULIN ASPART 100 UNIT/ML ~~LOC~~ SOLN
0.0000 [IU] | Freq: Three times a day (TID) | SUBCUTANEOUS | Status: DC
  Administered 2018-12-10 (×3): 4 [IU] via SUBCUTANEOUS
  Administered 2018-12-11: 3 [IU] via SUBCUTANEOUS
  Administered 2018-12-11 (×2): 4 [IU] via SUBCUTANEOUS
  Administered 2018-12-12: 12:00:00 7 [IU] via SUBCUTANEOUS
  Filled 2018-12-10 (×8): qty 1

## 2018-12-10 MED ORDER — COENZYME Q10 30 MG PO CAPS: 30.0000 mg | ORAL_CAPSULE | Freq: Every day | ORAL | Status: DC

## 2018-12-10 NOTE — Progress Notes (Signed)
PHARMACY NOTE:  ANTIMICROBIAL RENAL DOSAGE ADJUSTMENT  Current antimicrobial regimen includes a mismatch between antimicrobial dosage and estimated renal function.  As per policy approved by the Pharmacy & Therapeutics and Medical Executive Committees, the antimicrobial dosage will be adjusted accordingly.  Current antimicrobial dosage:  oseltamivir 75mg  BID   Indication: Influenza   Renal Function:  Estimated Creatinine Clearance: 41.8 mL/min (A) (by C-G formula based on SCr of 1.8 mg/dL (H)).    Antimicrobial dosage has been changed to: oseltamivir 30mg  BID   Thank you for allowing pharmacy to be a part of this patient's care.  Ambry Dix L, [facility] 12/10/2018 2:28 PM

## 2018-12-10 NOTE — Progress Notes (Signed)
[place] at [place] NAME: [name]    MR#:  [id number]  DATE OF BIRTH:  14-Jan-1946  SUBJECTIVE:   Patient admitted due to shortness of breath and hypoxia, cough secondary to flu with superimposed pneumonia.  Feels a little bit better since yesterday.  Still has a cough which is productive.   REVIEW OF SYSTEMS:    Review of Systems  Constitutional: Negative for chills and fever.  HENT: Negative for congestion and tinnitus.   Eyes: Negative for blurred vision and double vision.  Respiratory: Positive for cough and shortness of breath. Negative for wheezing.   Cardiovascular: Negative for chest pain, orthopnea and PND.  Gastrointestinal: Negative for abdominal pain, diarrhea, nausea and vomiting.  Genitourinary: Negative for dysuria and hematuria.  Neurological: Negative for dizziness, sensory change and focal weakness.  All other systems reviewed and are negative.   Nutrition: Heart Healthy/Carb control Tolerating Diet: Yes Tolerating PT: Ambulatory   DRUG ALLERGIES:   Allergies  Allergen Reactions  . Brilinta [Ticagrelor] Shortness Of Breath  . Tuberculin Tests Rash  . Benadryl [Diphenhydramine] Other (See Comments)    " Hyperactivity"  . Doxycycline Swelling    Pt went into pulmonary edema.  . Lopid [Gemfibrozil] Swelling    "I gain 1 pound a day for 30 days."    VITALS:  Blood pressure (!) 114/53, pulse 65, temperature 97.6 F (36.4 C), temperature source Oral, resp. rate 18, height 5\' 7"  (1.702 m), weight 103.1 kg, SpO2 100 %.  PHYSICAL EXAMINATION:   Physical Exam  GENERAL:  73 y.o.-year-old patient lying in bed in no acute distress.  EYES: Pupils equal, round, reactive to light and accommodation. No scleral icterus. Extraocular muscles intact.  HEENT: Head atraumatic, normocephalic. Oropharynx and nasopharynx clear.  NECK:  Supple, no jugular venous distention. No thyroid enlargement, no tenderness.    LUNGS: Good a/e b/l, end expiratory wheezing bilaterally, no rhonchi, rales.  Negative use of accessory muscles.  CARDIOVASCULAR: S1, S2 normal. No murmurs, rubs, or gallops.  ABDOMEN: Soft, nontender, nondistended. Bowel sounds present. No organomegaly or mass.  EXTREMITIES: No cyanosis, clubbing or edema b/l.    NEUROLOGIC: Cranial nerves II through XII are intact. No focal Motor or sensory deficits b/l.   PSYCHIATRIC: The patient is alert and oriented x 3.  SKIN: No obvious rash, lesion, or ulcer.    LABORATORY PANEL:   CBC Recent Labs  Lab 12/10/18 0143  WBC 11.2*  HGB 8.7*  HCT 26.3*  PLT 137*   ------------------------------------------------------------------------------------------------------------------  Chemistries  Recent Labs  Lab 12/09/18 2125 12/10/18 0143  NA 134*  --   K 4.4  --   CL 100  --   CO2 24  --   GLUCOSE 122*  --   BUN 46*  --   CREATININE 1.65* 1.80*  CALCIUM 8.7*  --   AST 48*  --   ALT 50*  --   ALKPHOS 85  --   BILITOT 0.6  --    ------------------------------------------------------------------------------------------------------------------  Cardiac Enzymes No results for input(s): TROPONINI in the last 168 hours. 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 Minimal enlargement of cardiac silhouette post CABG. Mediastinal contours and pulmonary vascularity normal. Patchy infiltrates in the mid RIGHT lung question pneumonia, predominantly RIGHT upper lobe.  Remaining lungs clear. No pleural effusion or pneumothorax. Scattered endplate spur formation thoracic spine. IMPRESSION: Minimal enlargement of cardiac silhouette post CABG and ICD. RIGHT mid lung infiltrates likely representing pneumonia. Electronically Signed   By: Lavonia Dana M.D.   On: 12/09/2018 21:34     ASSESSMENT AND PLAN:   73 year old male with past medical history of COPD, diabetes, GERD, hypertension, hyperlipidemia, CHF, ischemic cardiomyopathy who presented to the hospital due to cough, shortness of breath.  1.  Acute respiratory failure with hypoxia-secondary to COPD exacerbation with underlying flu and pneumonia. - Continue O2 supplementation and will treat with IV steroids, scheduled duo nebs, Pulmicort nebs. -Continue empiric antibiotics with ceftriaxone, Zithromax, continue Tamiflu.  2.  COPD exacerbation-secondary to the flu with superimposed pneumonia. -Continue Tamiflu, empiric IV antibiotics as mentioned above. -Continue IV steroids, scheduled duo nebs, Pulmicort nebs.  3.  Flu-patient is positive for influenza B -Continue airborne precautions, continue Tamiflu.  4.  Diabetes type 2 without complication-continue Lantus, NovoLog with meals and sliding scale insulin coverage.  Blood sugar stable.  5.  History of chronic systolic CHF-clinically patient is not in congestive heart failure.  Patient's blood pressure was a bit on the low side. - Continue carvedilol, ramipril, Aldactone, torsemide for now.  6.  Hyperlipidemia-continue atorvastatin.  7.  Neuropathy-continue gabapentin.  8.  Depression-continue Prozac.  9. GERD - cont. Protonix.      All the records are reviewed and case discussed with Care Management/Social Worker. Management plans discussed with the patient, family and they are in agreement.  CODE STATUS: Full code  DVT Prophylaxis: Lovenox  TOTAL TIME TAKING CARE OF THIS PATIENT: 30 minutes.   POSSIBLE D/C IN 1-2 DAYS, DEPENDING ON  CLINICAL CONDITION.   Henreitta Leber M.D on 12/10/2018 at 1:23 PM  Between 7am to 6pm - Pager - [phone]  After 6pm go to [email or web address] - Proofreader  [facility]  Office  [phone]  CC: Primary care physician; [name], MD

## 2018-12-10 NOTE — Progress Notes (Signed)
Patient's oral temperature 103.1, administered prn tylenol 1000mg , temperature down to 99.8 recheck an hour later

## 2018-12-10 NOTE — Plan of Care (Signed)

## 2018-12-11 LAB — CBC
HCT: 25.5 % — ABNORMAL LOW (ref 39.0–52.0)
Hemoglobin: 8.6 g/dL — ABNORMAL LOW (ref 13.0–17.0)
MCH: 30.2 pg (ref 26.0–34.0)
MCHC: 33.7 g/dL (ref 30.0–36.0)
MCV: 89.5 fL (ref 80.0–100.0)
Platelets: 144 10*3/uL — ABNORMAL LOW (ref 150–400)
RBC: 2.85 MIL/uL — ABNORMAL LOW (ref 4.22–5.81)
RDW: 13.4 % (ref 11.5–15.5)
WBC: 15.5 10*3/uL — ABNORMAL HIGH (ref 4.0–10.5)
nRBC: 0 % (ref 0.0–0.2)

## 2018-12-11 LAB — BASIC METABOLIC PANEL
Anion gap: 11 (ref 5–15)
BUN: 56 mg/dL — ABNORMAL HIGH (ref 8–23)
CALCIUM: 8.5 mg/dL — AB (ref 8.9–10.3)
CO2: 21 mmol/L — ABNORMAL LOW (ref 22–32)
Chloride: 101 mmol/L (ref 98–111)
Creatinine, Ser: 1.64 mg/dL — ABNORMAL HIGH (ref 0.61–1.24)
GFR calc Af Amer: 47 mL/min — ABNORMAL LOW (ref >60)
GFR calc non Af Amer: 41 mL/min — ABNORMAL LOW (ref >60)
Glucose, Bld: 204 mg/dL — ABNORMAL HIGH (ref 70–99)
Potassium: 4.4 mmol/L (ref 3.5–5.1)
Sodium: 133 mmol/L — ABNORMAL LOW (ref 135–145)

## 2018-12-11 LAB — GLUCOSE, CAPILLARY
Glucose-Capillary: 162 mg/dL — ABNORMAL HIGH (ref 70–99)
Glucose-Capillary: 178 mg/dL — ABNORMAL HIGH (ref 70–99)
Glucose-Capillary: 121 mg/dL — ABNORMAL HIGH (ref 70–99)
Glucose-Capillary: 177 mg/dL — ABNORMAL HIGH (ref 70–99)

## 2018-12-11 LAB — PROCALCITONIN: Procalcitonin: 1.28 ng/mL

## 2018-12-11 LAB — STREP PNEUMONIAE URINARY ANTIGEN: Strep Pneumo Urinary Antigen: NEGATIVE

## 2018-12-11 LAB — HIV ANTIBODY (ROUTINE TESTING W REFLEX): HIV Screen 4th Generation wRfx: NONREACTIVE

## 2018-12-11 MED ORDER — SODIUM CHLORIDE 0.9 % IV SOLN
INTRAVENOUS | Status: DC | PRN
  Administered 2018-12-11 – 2018-12-12 (×2): 250 mL via INTRAVENOUS

## 2018-12-11 MED ORDER — LOPERAMIDE HCL 2 MG PO CAPS
2.0000 mg | ORAL_CAPSULE | Freq: Once | ORAL | Status: AC
  Administered 2018-12-11: 2 mg via ORAL
  Filled 2018-12-11: qty 1

## 2018-12-11 NOTE — Plan of Care (Signed)

## 2018-12-11 NOTE — Progress Notes (Signed)
Nutrition Brief Note  Patient identified on the Malnutrition Screening Tool (MST) Report  Wt Readings from Last 15 Encounters:  12/11/18 103 kg  11/21/18 106.4 kg  10/16/18 109 kg  09/11/18 108 kg  09/09/18 107.1 kg  09/05/18 107.1 kg  08/28/18 111.6 kg  07/27/18 110.8 kg  06/29/18 111.6 kg  06/08/18 110.3 kg  06/04/18 111.1 kg  05/30/18 111.4 kg  03/21/18 111.2 kg  03/01/18 112 kg  02/21/18 67.30 kg   73 year old male with past medical history of COPD, diabetes, GERD, hypertension, hyperlipidemia, CHF, ischemic cardiomyopathy who presented to the hospital due to cough, shortness of breath.  Pt admitted with acute hypoxic respiratory failure secondary to multifactorial process that include rt sided PNA, acute influenza B, and COPD exacerbation.   Reviewed I/O's: -348 ml x 24 hours  Spoke with pt and wife at bedside. Per pt wife, pt always has a great appetite. Pt generally consumes 2 meals per day, which consists of a meal at [place] or a homemade meal of pinto beans and vegetables. Wife shares that they have been consuming a lot of beans and vegetables as of late. Intake declined 2-3 days PTA, due to pt not feeling well (pt shares that they actually cut their beach trip short to come to the hospital). Observed pt breakfast tray, of which he consumed 100%.   Pt shares that he has been trying to lose weight intentionally by cutting out snacks. Per pt he has lost about 25# in the past month, however, this is not consistent with wt hx. Per wt documentation, pt has experienced a 3.8% wt loss over the past 3 months, which is not significant for time frame. Pt wife shares that pt monitors his weight regularly and cuts back on snacking when he feels like he has gained too much weight.   Nutrition-Focused physical exam completed. Findings are no fat depletion, no muscle depletion, and mild edema.   Discussed importance of good meal intake to promote healing.   Last Hgb A1c: 5.9  (12/05/17). PTA DM medications are 60 units insulin glargine q HS and 0-15 units insulin aspart TID.   Labs reviewed: CBGS: [phone] (inpatient orders for glycemic control are 0-20 units insulin aspart TID with meals, 0-5 units insulin aspart q HS, 15 units insulin aspart TID with meals, and 80 units insulin glargine q HS). Medications reviewed and include solu-medrol, which is likely contributing to elevated CBGS.   Body mass index is 35.56 kg/m. Patient meets criteria for obesity, class II based on current BMI.   Current diet order is heart healthy/ carb modified with 1.2 L fluid restriction, patient is consuming approximately 100% of meals at this time. Labs and medications reviewed.   No nutrition interventions warranted at this time. If nutrition issues arise, please consult RD.   Charod Slawinski A. Jimmye Norman, RD, LDN, CDE Pager: [phone] After hours Pager: [phone]

## 2018-12-11 NOTE — Progress Notes (Signed)
Pt asleep on cpap

## 2018-12-11 NOTE — Care Management Note (Signed)
Case Management Note  Patient Details  Name: [name] MRN: [id number] Date of Birth: [birth year]  Subjective/Objective:   Patient admitted with flu and pneumonia.  Patient is from home in [place] and lives with his wife.  Patient reports that he is completely independent.  He reports he has a walker and a cane at home but never uses them.  Patient drives, he and his wife were at the beach last week and he reported falling in the sand he tripped going from concrete to sand and not paying attention.  Patient reports that he does not need home health services.  PCP verified as [name] next appointment is Feb. 10th.  [name].  RNCM signed off. Doran Clay RN BSN [phone]                  Action/Plan:   Expected Discharge Date:                  Expected Discharge Plan:  Home/Self Care  In-House Referral:     Discharge planning Services  CM Consult  Post Acute Care Choice:    Choice offered to:     DME Arranged:    DME Agency:     HH Arranged:    [place] Agency:     Status of Service:  Completed, signed off  If discussed at [place] of Stay Meetings, dates discussed:    Additional Comments:  Shelbie Hutching, RN 12/11/2018, 3:53 PM

## 2018-12-11 NOTE — Progress Notes (Signed)
[facility] [place] NAME: [name]    MR#:  [id number]  DATE OF BIRTH:  1946/05/02  SUBJECTIVE:   Patient admitted due to shortness of breath and hypoxia, cough secondary to flu with superimposed pneumonia.  Feels a little bit better since yesterday.  Still has a cough which is productive.   REVIEW OF SYSTEMS:    Review of Systems  Constitutional: Negative for chills and fever.  HENT: Negative for congestion and tinnitus.   Eyes: Negative for blurred vision and double vision.  Respiratory: Positive for cough and shortness of breath. Negative for wheezing.   Cardiovascular: Negative for chest pain, orthopnea and PND.  Gastrointestinal: Negative for abdominal pain, diarrhea, nausea and vomiting.  Genitourinary: Negative for dysuria and hematuria.  Neurological: Negative for dizziness, sensory change and focal weakness.  All other systems reviewed and are negative.   Nutrition: Heart Healthy/Carb control Tolerating Diet: Yes Tolerating PT: Ambulatory   DRUG ALLERGIES:   Allergies  Allergen Reactions  . Brilinta [Ticagrelor] Shortness Of Breath  . Tuberculin Tests Rash  . Benadryl [Diphenhydramine] Other (See Comments)    " Hyperactivity"  . Doxycycline Swelling    Pt went into pulmonary edema.  . Lopid [Gemfibrozil] Swelling    "I gain 1 pound a day for 30 days."    VITALS:  Blood pressure (!) 103/55, pulse 66, temperature (!) 97.5 F (36.4 C), temperature source Oral, resp. rate 16, height 5\' 7"  (1.702 m), weight 103 kg, SpO2 96 %.  PHYSICAL EXAMINATION:   Physical Exam  GENERAL:  73 y.o.-year-old patient lying in bed in no acute distress.  EYES: Pupils equal, round, reactive to light and accommodation. No scleral icterus. Extraocular muscles intact.  HEENT: Head atraumatic, normocephalic. Oropharynx and nasopharynx clear.  NECK:  Supple, no jugular venous distention. No thyroid enlargement, no tenderness.    LUNGS: Good a/e b/l, end expiratory wheezing bilaterally, no rhonchi, rales.  Negative use of accessory muscles.  CARDIOVASCULAR: S1, S2 normal. No murmurs, rubs, or gallops.  ABDOMEN: Soft, nontender, nondistended. Bowel sounds present. No organomegaly or mass.  EXTREMITIES: No cyanosis, clubbing or edema b/l.    NEUROLOGIC: Cranial nerves II through XII are intact. No focal Motor or sensory deficits b/l.   PSYCHIATRIC: The patient is alert and oriented x 3.  SKIN: No obvious rash, lesion, or ulcer.    LABORATORY PANEL:   CBC Recent Labs  Lab 12/11/18 0434  WBC 15.5*  HGB 8.6*  HCT 25.5*  PLT 144*   ------------------------------------------------------------------------------------------------------------------  Chemistries  Recent Labs  Lab 12/09/18 2125  12/11/18 1054  NA 134*  --  133*  K 4.4  --  4.4  CL 100  --  101  CO2 24  --  21*  GLUCOSE 122*  --  204*  BUN 46*  --  56*  CREATININE 1.65*   < > 1.64*  CALCIUM 8.7*  --  8.5*  AST 48*  --   --   ALT 50*  --   --   ALKPHOS 85  --   --   BILITOT 0.6  --   --    < > = values in this interval not displayed.   ------------------------------------------------------------------------------------------------------------------  Cardiac Enzymes No results for input(s): TROPONINI in the last 168 hours. 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 Minimal enlargement of cardiac silhouette post CABG. Mediastinal contours and pulmonary vascularity  normal. Patchy infiltrates in the mid RIGHT lung question pneumonia, predominantly RIGHT upper lobe. Remaining lungs clear. No pleural effusion or pneumothorax. Scattered endplate spur formation thoracic spine. IMPRESSION: Minimal enlargement of cardiac silhouette post CABG and ICD. RIGHT mid lung infiltrates likely representing pneumonia. Electronically Signed   By: Lavonia Dana M.D.   On: 12/09/2018 21:34     ASSESSMENT AND PLAN:   73 year old male with past medical history of COPD, diabetes, GERD, hypertension, hyperlipidemia, CHF, ischemic cardiomyopathy who presented to the hospital due to cough, shortness of breath.  1.  Acute respiratory failure with hypoxia-secondary to COPD exacerbation with underlying flu and pneumonia. - Continue O2 supplementation and will treat with IV steroids, scheduled duo nebs, Pulmicort nebs. -Continue empiric antibiotics with ceftriaxone, Zithromax, continue Tamiflu.  2.  COPD exacerbation-secondary to the flu with superimposed pneumonia. -Continue Tamiflu, empiric IV antibiotics as mentioned above. -Continue IV steroids, scheduled duo nebs, Pulmicort nebs.  3.  Flu-patient is positive for influenza B -Continue airborne precautions, continue Tamiflu.  4.  Diabetes type 2 without complication-continue Lantus, NovoLog with meals and sliding scale insulin coverage.  Blood sugar stable.  5.  History of chronic systolic CHF-clinically patient is not in congestive heart failure.  Patient's blood pressure was a bit on the low side. - Continue carvedilol, stopped ramipril, Aldactone, torsemide for now.  6.  Hyperlipidemia-continue atorvastatin.  7.  Neuropathy-continue gabapentin.  8.  Depression-continue Prozac.  9. GERD - cont. Protonix.   10. Ac renal failure- Check BMP daily, Hold ACE inh and Diuretics.   All the records are reviewed and case discussed with Care Management/Social Worker. Management plans discussed with the patient, family and they  are in agreement.  CODE STATUS: Full code  DVT Prophylaxis: Lovenox  TOTAL TIME TAKING CARE OF THIS PATIENT: 30 minutes.   POSSIBLE D/C IN 1-2 DAYS, DEPENDING ON CLINICAL CONDITION.   Vaughan Basta M.D on 12/11/2018 at 2:29 PM  Between 7am to 6pm - Pager - [phone]  After 6pm go to [email or web address] - Proofreader  [facility]  Office  [phone]  CC: Primary care physician; [name], MD

## 2018-12-12 LAB — LEGIONELLA PNEUMOPHILA SEROGP 1 UR AG: L. PNEUMOPHILA SEROGP 1 UR AG: NEGATIVE

## 2018-12-12 LAB — GLUCOSE, CAPILLARY
Glucose-Capillary: 215 mg/dL — ABNORMAL HIGH (ref 70–99)
Glucose-Capillary: 217 mg/dL — ABNORMAL HIGH (ref 70–99)

## 2018-12-12 MED ORDER — CEFUROXIME AXETIL 250 MG PO TABS: 250.0000 mg | ORAL_TABLET | Freq: Two times a day (BID) | ORAL | 0 refills | Status: AC

## 2018-12-12 MED ORDER — INSULIN GLARGINE 100 UNIT/ML ~~LOC~~ SOLN: 80.0000 [IU] | Freq: Every day | SUBCUTANEOUS | 11 refills | Status: DC

## 2018-12-12 MED ORDER — PREDNISONE 10 MG (21) PO TBPK: ORAL_TABLET | ORAL | 0 refills | Status: DC

## 2018-12-12 MED ORDER — OSELTAMIVIR PHOSPHATE 30 MG PO CAPS: 30.0000 mg | ORAL_CAPSULE | Freq: Two times a day (BID) | ORAL | 0 refills | Status: AC

## 2018-12-12 NOTE — Care Management (Signed)
No discharge needs identified by members of the care team 

## 2018-12-12 NOTE — Care Management Important Message (Signed)
Important Message  Patient Details  Name: [name] MRN: [id number] Date of Birth: Jul 05, 1946   Medicare Important Message Given:  Yes    Katrina Stack, RN 12/12/2018, 5:02 PM

## 2018-12-12 NOTE — Progress Notes (Signed)
Reviewed AVS with pt and family. Pt verbalized understanding. 3 Printed RXs with AVS packet.

## 2018-12-13 LAB — BLOOD GAS, VENOUS
Acid-Base Excess: 1 mmol/L (ref 0.0–2.0)
Bicarbonate: 25.2 mmol/L (ref 20.0–28.0)
O2 Saturation: 20.8 %
Patient temperature: 37
pCO2, Ven: 38 mmHg — ABNORMAL LOW (ref 44.0–60.0)
pH, Ven: 7.43 (ref 7.250–7.430)

## 2018-12-14 ENCOUNTER — Other Ambulatory Visit: Payer: Self-pay

## 2018-12-14 LAB — CULTURE, BLOOD (ROUTINE X 2)
CULTURE: NO GROWTH
Culture: NO GROWTH

## 2018-12-14 NOTE — Patient Outreach (Signed)
[facility]) Care Management  [place]   12/14/2018  SASHA RUETH 1946-04-18 [id number]  Reason for referral: Medication Reconciliation Post Discharge  Current insurance:[employer]  PMHx includes but not limited to:   Chronic systolic heart failure, hypertension, coronary artery disease, unstable angina, COPD, diabetes mellitus and hyperlipidemia.  Outreach:  Successful telephone call with Mr. Baruch and his wife.  HIPAA identifiers verified.   Subjective:  Wife reports that her husband is still in bed with his CPAP on, but he was able to speak with me.  She states that his cough is improving.  She reports that he fell while [place] were at the beach and "tore his arm up," but it was looked at when he was in the hospital.  Mr. Bearden states that he has not checked his glucose since discharge, but it usually "runs in the low 100s".  Objective: Lab Results  Component Value Date   CREATININE 1.64 (H) 12/11/2018   CREATININE 1.80 (H) 12/10/2018   CREATININE 1.65 (H) 12/09/2018    Lab Results  Component Value Date   HGBA1C 5.9 (H) 12/05/2017    Lipid Panel     Component Value Date/Time   CHOL 140 01/05/2014 0219   TRIG 103 01/05/2014 0219   HDL 33 (L) 01/05/2014 0219   VLDL 21 01/05/2014 0219   LDLCALC 86 01/05/2014 0219    BP Readings from Last 3 Encounters:  12/12/18 (!) 103/33  11/21/18 (!) 104/52  10/16/18 (!) 113/40    Allergies  Allergen Reactions  . Brilinta [Ticagrelor] Shortness Of Breath  . Tuberculin Tests Rash  . Benadryl [Diphenhydramine] Other (See Comments)    " Hyperactivity"  . Doxycycline Swelling    Pt went into pulmonary edema.  . Lopid [Gemfibrozil] Swelling    "I gain 1 pound a day for 30 days."    Medications Reviewed Today    Reviewed by Dionne Milo, [facility] (Pharmacist) on 12/14/18 at 1104  Med List Status: <None>  Medication Order Taking? Sig Documenting Provider Last Dose Status  Informant  acetaminophen (TYLENOL) 500 MG tablet [id number] No Take 1,000 mg by mouth daily as needed for moderate pain. [provider] Not Taking Active Self  albuterol (PROVENTIL HFA;VENTOLIN HFA) 108 (90 Base) MCG/ACT inhaler [id number] Yes Inhale 2 puffs into the lungs every 6 (six) hours as needed for wheezing or shortness of breath. Alfred Levins, [name], MD Taking Active Self  allopurinol (ZYLOPRIM) 100 MG tablet [id number] Yes Take 100 mg by mouth daily. [provider] Taking Active Self  aspirin 81 MG chewable tablet [id number] Yes Chew 1 tablet (81 mg total) by mouth daily. Mayo, Pete [name], MD Taking Active Self  atorvastatin (LIPITOR) 80 MG tablet [id number] Yes Take 1 tablet (80 mg total) by mouth every evening. Theodoro [name], MD Taking Active Self  budesonide-formoterol [facility]) 80-4.5 MCG/ACT inhaler [id number] Yes Inhale 2 puffs into the lungs 2 (two) times daily. [provider] Taking Active Self  calcium carbonate (TUMS - DOSED IN MG ELEMENTAL CALCIUM) 500 MG chewable tablet [id number] Yes Chew 1 tablet by mouth as needed for indigestion or heartburn. [provider] Taking Active Self           Med Note Owens Shark, KADESHA   Tue Nov 22, 2017  3:35 PM)    carvedilol (COREG) 3.125 MG tablet [id number] Yes Take 1 tablet (3.125 mg total) by mouth 2 (two) times daily with a meal. Gladstone [name], MD Taking Active Self  cefUROXime (CEFTIN)  250 MG tablet [id number] Yes Take 1 tablet (250 mg total) by mouth 2 (two) times daily for 3 days. Vaughan Basta, MD Taking Active   clopidogrel (PLAVIX) 75 MG tablet [id number] Yes Take 75 mg by mouth daily. [provider] Taking Active Self  co-enzyme Q-10 30 MG capsule [id number] Yes Take 30 mg by mouth daily. [provider] Taking Active Self  ferrous sulfate 325 (65 FE) MG tablet [id number] Yes Take 325 mg by mouth 2 (two) times daily with a meal. [provider] Taking Active Self   FLUoxetine (PROZAC) 20 MG capsule [id number] Yes Take 20 mg by mouth daily. [provider] Taking Active Self  gabapentin (NEURONTIN) 300 MG capsule [id number] Yes Take 300 mg by mouth 2 (two) times daily.  [provider] Taking Active Self  guaiFENesin (MUCINEX) 600 MG 12 hr tablet [id number] No Take 600 mg by mouth 2 (two) times daily as needed.  [provider] Not Taking Active Self  insulin glargine (LANTUS) 100 UNIT/ML injection [id number] Yes Inject 0.8 mLs (80 Units total) into the skin at bedtime. Vaughan Basta, MD Taking Active   insulin lispro (HUMALOG) 100 UNIT/ML KiwkPen [id number] Yes Inject 0-15 Units into the skin 3 (three) times daily with meals. Per sliding scale [provider] Taking Active Self  lansoprazole (PREVACID) 15 MG capsule [id number] Yes Take 30 mg by mouth 2 (two) times daily. [provider] Taking Active Self  loratadine (CLARITIN) 10 MG tablet [id number] Yes Take 10 mg by mouth daily. [provider] Taking Active Self  Magnesium Oxide 400 (240 Mg) MG TABS [id number] Yes Take 1 tablet (400 mg total) by mouth 2 (two) times daily. Alisa Graff, FNP Taking Active Self  Multiple Vitamins-Minerals (CENTRUM SILVER PO) [id number] Yes Take 1 tablet by mouth every morning. [provider] Taking Active Self  Multiple Vitamins-Minerals (PRESERVISION AREDS 2) CAPS [id number] Yes Take 1 capsule by mouth 2 (two) times daily. For macular degeneration- eye vitamins [provider] Taking Active Self  nitroGLYCERIN (NITROSTAT) 0.4 MG SL tablet [id number] No Place 0.4 mg under the tongue every 5 (five) minutes as needed for chest pain. [provider] Not Taking Active Self           Med Note Zara Council, Denita Lung Dec 23, 2016  4:07 PM)    oseltamivir (TAMIFLU) 30 MG capsule [id number] Yes Take 1 capsule (30 mg total) by mouth 2 (two) times daily for 2 days. Vaughan Basta, MD Taking Active    predniSONE (STERAPRED UNI-PAK 21 TAB) 10 MG (21) TBPK tablet [id number] Yes Take 6 tabs first day, 5 tab on day 2, then 4 on day 3rd, 3 tabs on day 4th , 2 tab on day 5th, and 1 tab on 6th day. Vaughan Basta, MD Taking Active   ranolazine (RANEXA) 500 MG 12 hr tablet [id number] Yes Take 1 tablet (500 mg total) by mouth 2 (two) times daily. Alisa Graff, FNP Taking Active Self  tamsulosin (FLOMAX) 0.4 MG CAPS capsule [id number] Yes Take 0.4 mg by mouth at bedtime.  [provider] Taking Active Self  tiotropium (SPIRIVA) 18 MCG inhalation capsule [id number] Yes Place 1 capsule (18 mcg total) into inhaler and inhale daily. Loletha Grayer, MD Taking Active Self         ASSESSMENT: Date Discharged from Hospital: 12/12/18 Date Medication Reconciliation Performed: 12/14/2018  Medications:  New at Discharge:  Per AVS . cefuroxime . Prednisone . oseltamivir  Discontinued at Discharge:  Per AVS  Ramipril  Spironolactone  torsemide  Patient was recently discharged from hospital and all medications have been reviewed.  Assessment:  Drugs sorted by system:  Neurologic/Psychologic: fluoxetine, gabapentin,   Cardiovascular: aspirin 81 mg, atorvastatin, carvedilol ,clopidogrel,ranolazine nitroglycerin,   Pulmonary/Allergy: budesonide/formoterol, guaifenesin, loratadine, prednisone, tiotropium  Gastrointestinal: calcium carbonate, lansoprazole  Endocrine: insulin glargine, insulin lispro  Infectious Diseases: cefuroxime, oseltamivir  Genitourinary: tamsulosin  Pain: acetaminophen   Vitamins/Minerals/Supplements: co-enzyme Q 10, ferrous sulfate, magnesium oxide, MVI, Perservision Areds,   Miscellaneous: allopurinol  Medication Review Findings:  . Use of lansoprazole may lead to reduce ability of clopidogrel to inhibit platelet aggregation and increase the risk of subsequent cardiovascular events. . Heart failure medications discontinued at discharge.  He sees his  PCP on 1/13 and has an appointment with the HF clinic on 1/22.   Medication Assistance Findings:  No medication assistance needs identified.  He gets his prescriptions through the [place].  Plan: Route note to PCP, Dr. Netty Starring.  Joetta Manners, PharmD Clinical Pharmacist [place] [phone]

## 2018-12-15 ENCOUNTER — Telehealth: Payer: Self-pay

## 2018-12-15 NOTE — Telephone Encounter (Signed)
EMMI Follow-up: Noted on the report that the patient didn't receive his discharge paperwork. I called [name] but the voicemail box was full so was unable to leave a message.

## 2018-12-18 ENCOUNTER — Telehealth: Payer: Self-pay

## 2018-12-18 DIAGNOSIS — Z8701 Personal history of pneumonia (recurrent): Secondary | ICD-10-CM | POA: Diagnosis not present

## 2018-12-18 DIAGNOSIS — I5022 Chronic systolic (congestive) heart failure: Secondary | ICD-10-CM | POA: Diagnosis not present

## 2018-12-18 DIAGNOSIS — Z8709 Personal history of other diseases of the respiratory system: Secondary | ICD-10-CM | POA: Diagnosis not present

## 2018-12-18 DIAGNOSIS — N179 Acute kidney failure, unspecified: Secondary | ICD-10-CM | POA: Diagnosis not present

## 2018-12-18 NOTE — Telephone Encounter (Signed)
EMMI Follow-up: Second attempt to contact Mr. Quaintance but the voicemail if full so unable to leave a message.

## 2018-12-19 IMAGING — DX DG CHEST 1V PORT
1 series · 2 of 2 positions shown · non-contrast
Comparison: Chest radiograph 02/18/2018

CLINICAL DATA: Patient with congestive heart failure

EXAM:
PORTABLE CHEST 1 VIEW

[Series 1: chest ap · 0.14mm/px · 2 of 2 slices shown]
[im 1/2]
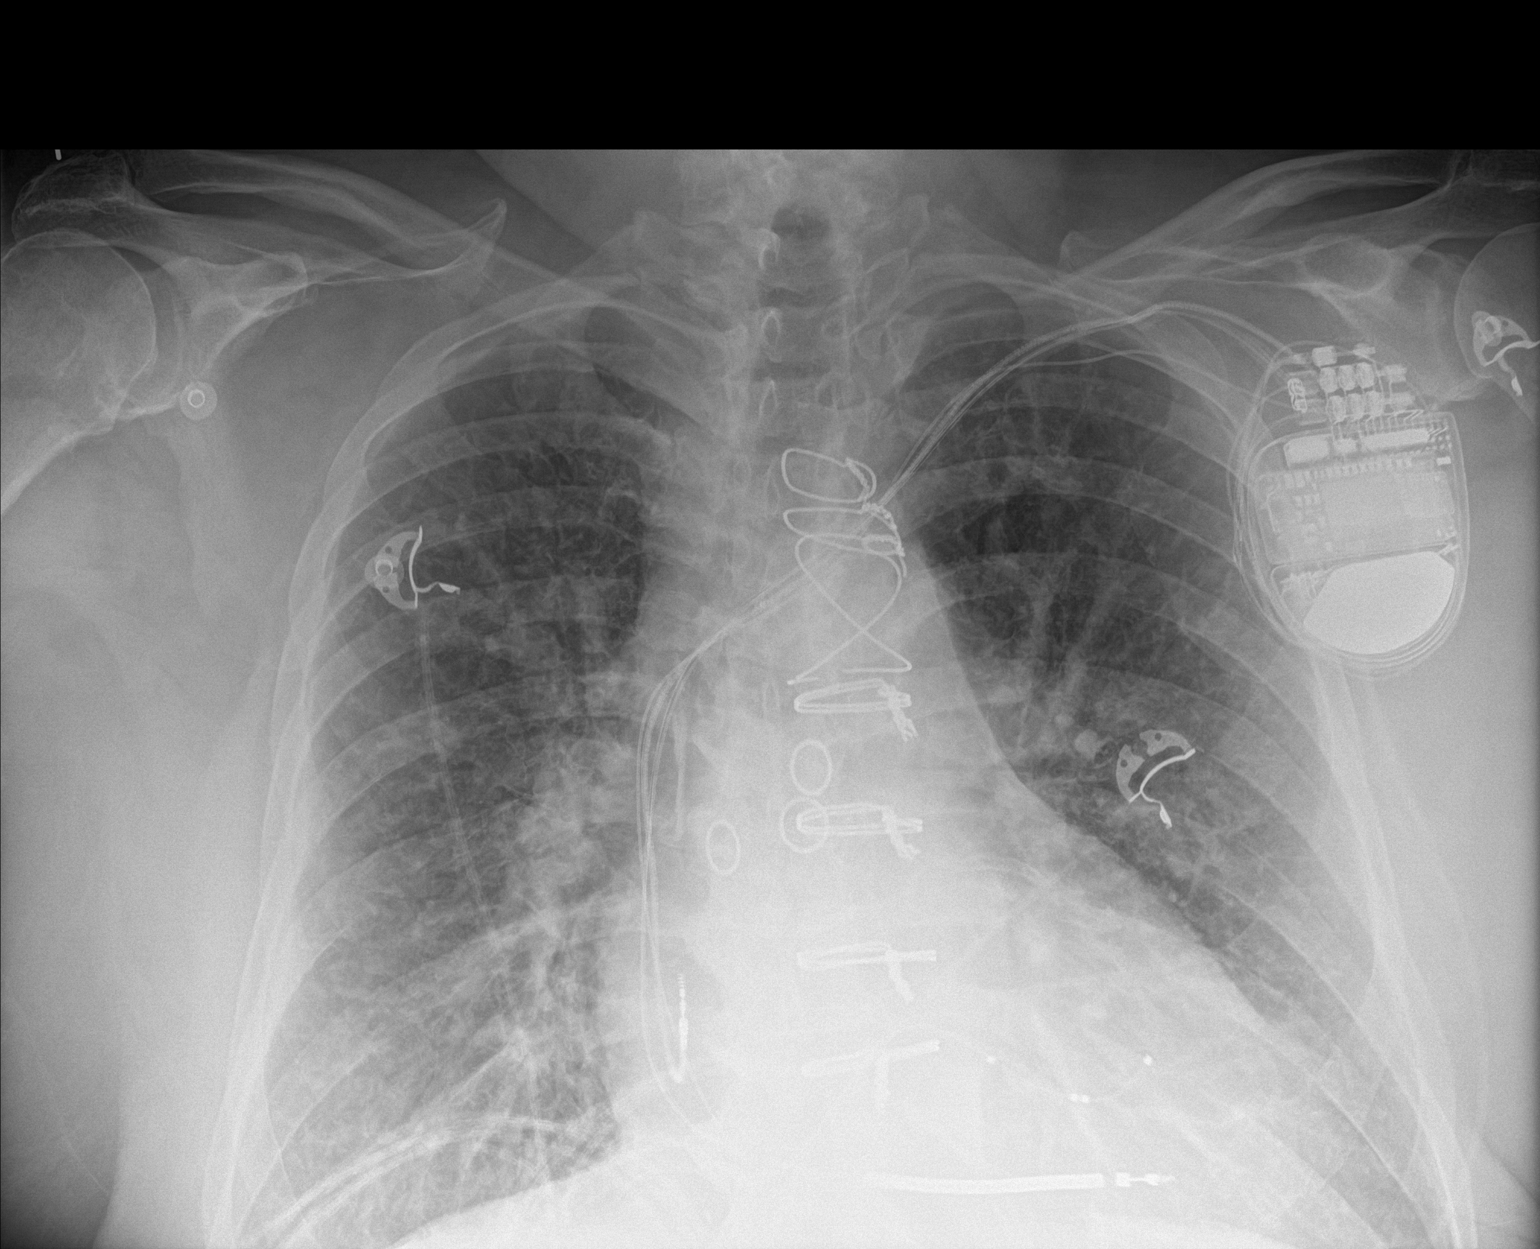
[im 2/2]
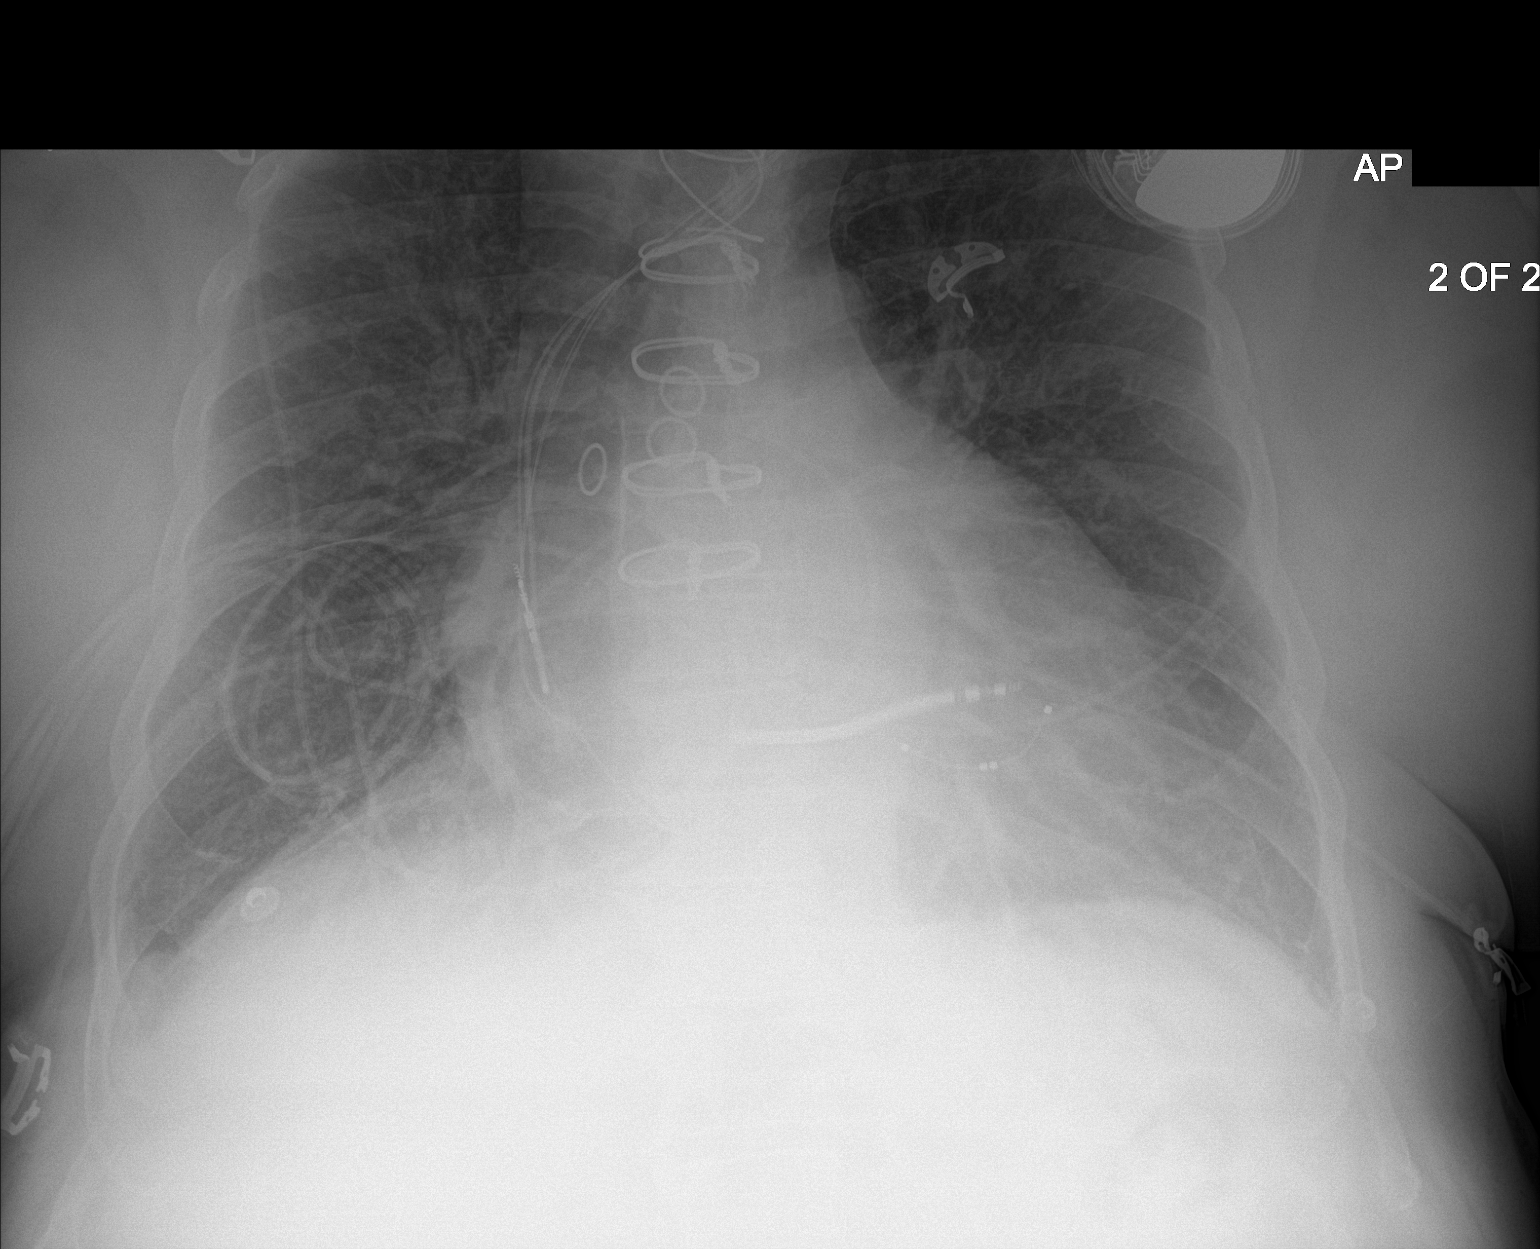

[2 of 2 positions shown; findings below may reference images not displayed]

FINDINGS: Monitoring leads overlie the patient. Stable enlarged cardiac and
mediastinal contours status post median sternotomy. AICD device
overlies the left hemithorax. Pulmonary vascular redistribution and
interstitial opacities bilaterally. No pleural effusion or
pneumothorax.
IMPRESSION: Findings compatible with interstitial pulmonary edema.

## 2018-12-19 NOTE — Discharge Summary (Signed)
[facility] NAME: [name]    MR#:  [id number]  DATE OF BIRTH:  05/23/1946  DATE OF ADMISSION:  12/09/2018 ADMITTING PHYSICIAN: Gorden Harms, MD  DATE OF DISCHARGE: 12/12/2018  5:09 PM  PRIMARY CARE PHYSICIAN: [name], MD    ADMISSION DIAGNOSIS:  Influenza B [J10.1] Hypoxia [R09.02] Community acquired pneumonia of right middle lobe of lung ([place]) [J18.1]  DISCHARGE DIAGNOSIS:  Active Problems:   Respiratory failure ([place])   SECONDARY DIAGNOSIS:   Past Medical History:  Diagnosis Date  . Anemia   . Cancer ([place]) 12/2013   prostate  . Cardiogenic pulmonary edema ([place]) 12/19/2014  . Cardiomyopathy, ischemic   . CHF (congestive heart failure) ([place])   . COPD (chronic obstructive pulmonary disease) ([place])   . Diabetes mellitus without complication ([place])   . GERD (gastroesophageal reflux disease)   . Hypercholesteremia   . Hypertension   . Myocardial infarction ([place]) [id number]  . Shortness of breath dyspnea     HOSPITAL COURSE:   73 year old male with past medical history of COPD, diabetes, GERD, hypertension, hyperlipidemia, CHF, ischemic cardiomyopathy who presented to the hospital due to cough, shortness of breath.  1.  Acute respiratory failure with hypoxia-secondary to COPD exacerbation with underlying flu and pneumonia. - Continue O2 supplementation and will treat with IV steroids, scheduled duo nebs, Pulmicort nebs. -Continue empiric antibiotics with ceftriaxone, Zithromax, continue Tamiflu.  2.  COPD exacerbation-secondary to the flu with superimposed pneumonia. -Continue Tamiflu, empiric IV antibiotics as mentioned above. -Continue IV steroids, scheduled duo nebs, Pulmicort nebs.  3.  Flu-patient is positive for influenza B -Continue airborne precautions, continue Tamiflu.  4.  Diabetes type 2 without complication-continue Lantus, NovoLog with meals and sliding scale insulin  coverage.  Blood sugar stable.  5.  History of chronic systolic CHF-clinically patient is not in congestive heart failure.  Patient's blood pressure was a bit on the low side. - Continue carvedilol, stopped ramipril, Aldactone, torsemide for now.  6.  Hyperlipidemia-continue atorvastatin.  7.  Neuropathy-continue gabapentin.  8.  Depression-continue Prozac.  9. GERD - cont. Protonix.   10. Ac renal failure- Check BMP daily, Hold ACE inh and Diuretics.   DISCHARGE CONDITIONS:   Stable.  CONSULTS OBTAINED:    DRUG ALLERGIES:   Allergies  Allergen Reactions  . Brilinta [Ticagrelor] Shortness Of Breath  . Tuberculin Tests Rash  . Benadryl [Diphenhydramine] Other (See Comments)    " Hyperactivity"  . Doxycycline Swelling    Pt went into pulmonary edema.  . Lopid [Gemfibrozil] Swelling    "I gain 1 pound a day for 30 days."    DISCHARGE MEDICATIONS:   Allergies as of 12/12/2018      Reactions   Brilinta [ticagrelor] Shortness Of Breath   Tuberculin Tests Rash   Benadryl [diphenhydramine] Other (See Comments)   " Hyperactivity"   Doxycycline Swelling   Pt went into pulmonary edema.   Lopid [gemfibrozil] Swelling   "I gain 1 pound a day for 30 days."      Medication List    STOP taking these medications   ramipril 2.5 MG capsule Commonly known as:  ALTACE   spironolactone 25 MG tablet Commonly known as:  ALDACTONE   torsemide 20 MG tablet Commonly known as:  DEMADEX     TAKE these medications   acetaminophen 500 MG tablet Commonly known as:  TYLENOL Take 1,000 mg by mouth daily as needed for moderate pain.  albuterol 108 (90 Base) MCG/ACT inhaler Commonly known as:  PROVENTIL HFA;VENTOLIN HFA Inhale 2 puffs into the lungs every 6 (six) hours as needed for wheezing or shortness of breath.   allopurinol 100 MG tablet Commonly known as:  ZYLOPRIM Take 100 mg by mouth daily.   aspirin 81 MG chewable tablet Chew 1 tablet (81 mg total) by mouth  daily.   atorvastatin 80 MG tablet Commonly known as:  LIPITOR Take 1 tablet (80 mg total) by mouth every evening.   budesonide-formoterol 80-4.5 MCG/ACT inhaler Commonly known as:  SYMBICORT Inhale 2 puffs into the lungs 2 (two) times daily.   calcium carbonate 500 MG chewable tablet Commonly known as:  TUMS - dosed in mg elemental calcium Chew 1 tablet by mouth as needed for indigestion or heartburn.   carvedilol 3.125 MG tablet Commonly known as:  COREG Take 1 tablet (3.125 mg total) by mouth 2 (two) times daily with a meal.   CENTRUM SILVER PO Take 1 tablet by mouth every morning.   PRESERVISION AREDS 2 Caps Take 1 capsule by mouth 2 (two) times daily. For macular degeneration- eye vitamins   clopidogrel 75 MG tablet Commonly known as:  PLAVIX Take 75 mg by mouth daily.   co-enzyme Q-10 30 MG capsule Take 30 mg by mouth daily.   ferrous sulfate 325 (65 FE) MG tablet Take 325 mg by mouth 2 (two) times daily with a meal.   FLUoxetine 20 MG capsule Commonly known as:  PROZAC Take 20 mg by mouth daily.   gabapentin 300 MG capsule Commonly known as:  NEURONTIN Take 300 mg by mouth 2 (two) times daily.   guaiFENesin 600 MG 12 hr tablet Commonly known as:  MUCINEX Take 600 mg by mouth 2 (two) times daily as needed.   insulin glargine 100 UNIT/ML injection Commonly known as:  LANTUS Inject 0.8 mLs (80 Units total) into the skin at bedtime. What changed:  how much to take   insulin lispro 100 UNIT/ML KiwkPen Commonly known as:  HUMALOG Inject 0-15 Units into the skin 3 (three) times daily with meals. Per sliding scale   lansoprazole 15 MG capsule Commonly known as:  PREVACID Take 30 mg by mouth 2 (two) times daily.   loratadine 10 MG tablet Commonly known as:  CLARITIN Take 10 mg by mouth daily.   Magnesium Oxide 400 (240 Mg) MG Tabs Take 1 tablet (400 mg total) by mouth 2 (two) times daily.   nitroGLYCERIN 0.4 MG SL tablet Commonly known as:   NITROSTAT Place 0.4 mg under the tongue every 5 (five) minutes as needed for chest pain.   predniSONE 10 MG (21) Tbpk tablet Commonly known as:  STERAPRED UNI-PAK 21 TAB Take 6 tabs first day, 5 tab on day 2, then 4 on day 3rd, 3 tabs on day 4th , 2 tab on day 5th, and 1 tab on 6th day.   ranolazine 500 MG 12 hr tablet Commonly known as:  RANEXA Take 1 tablet (500 mg total) by mouth 2 (two) times daily.   tamsulosin 0.4 MG Caps capsule Commonly known as:  FLOMAX Take 0.4 mg by mouth at bedtime.   tiotropium 18 MCG inhalation capsule Commonly known as:  SPIRIVA Place 1 capsule (18 mcg total) into inhaler and inhale daily.     ASK your doctor about these medications   cefUROXime 250 MG tablet Commonly known as:  CEFTIN Take 1 tablet (250 mg total) by mouth 2 (two) times daily for 3  days. Ask about: Should I take this medication?   oseltamivir 30 MG capsule Commonly known as:  TAMIFLU Take 1 capsule (30 mg total) by mouth 2 (two) times daily for 2 days. Ask about: Should I take this medication?        DISCHARGE INSTRUCTIONS:    Follow with PMD.  If you experience worsening of your admission symptoms, develop shortness of breath, life threatening emergency, suicidal or homicidal thoughts you must seek medical attention immediately by calling 911 or calling your MD immediately  if symptoms less severe.  You Must read complete instructions/literature along with all the possible adverse reactions/side effects for all the Medicines you take and that have been prescribed to you. Take any new Medicines after you have completely understood and accept all the possible adverse reactions/side effects.   Please note  You were cared for by a hospitalist during your hospital stay. If you have any questions about your discharge medications or the care you received while you were in the hospital after you are discharged, you can call the unit and asked to speak with the hospitalist on call  if the hospitalist that took care of you is not available. Once you are discharged, your primary care physician will handle any further medical issues. Please note that NO REFILLS for any discharge medications will be authorized once you are discharged, as it is imperative that you return to your primary care physician (or establish a relationship with a primary care physician if you do not have one) for your aftercare needs so that they can reassess your need for medications and monitor your lab values.    Today   CHIEF COMPLAINT:   Chief Complaint  Patient presents with  . Shortness of Breath    HISTORY OF PRESENT ILLNESS:  [name]  is a 73 y.o. male with a known history of presenting to the emergency room with 1 to 2-day history of worsening shortness of breath, generalized weakness, fatigue, productive cough, chills, chest tightness, wheezing, and emergency room patient was found to be tachypneic, sodium 134, creatinine 1.6, white count 11,000, chest x-ray noted for right-sided pneumonia, flu B+, patient evaluated emergency room, family at the bedside, patient now be admitted for acute hypoxic respiratory failure secondary to right-sided pneumonia, acute influenza B infection, and COPD exacerbation.    VITAL SIGNS:  Blood pressure (!) 103/33, pulse 73, temperature 97.6 F (36.4 C), temperature source Oral, resp. rate (!) 21, height 5\' 7"  (1.702 m), weight 103 kg, SpO2 94 %.  I/O:  No intake or output data in the 24 hours ending 12/19/18 2105  PHYSICAL EXAMINATION:   GENERAL:  73 y.o.-year-old patient lying in bed in no acute distress.  EYES: Pupils equal, round, reactive to light and accommodation. No scleral icterus. Extraocular muscles intact.  HEENT: Head atraumatic, normocephalic. Oropharynx and nasopharynx clear.  NECK:  Supple, no jugular venous distention. No thyroid enlargement, no tenderness.  LUNGS: Good a/e b/l, end expiratory wheezing bilaterally, no rhonchi,  rales.  Negative use of accessory muscles.  CARDIOVASCULAR: S1, S2 normal. No murmurs, rubs, or gallops.  ABDOMEN: Soft, nontender, nondistended. Bowel sounds present. No organomegaly or mass.  EXTREMITIES: No cyanosis, clubbing or edema b/l.    NEUROLOGIC: Cranial nerves II through XII are intact. No focal Motor or sensory deficits b/l.   PSYCHIATRIC: The patient is alert and oriented x 3.  SKIN: No obvious rash, lesion, or ulcer.    DATA REVIEW:   CBC No results for  input(s): WBC, HGB, HCT, PLT in the last 168 hours.  Chemistries  No results for input(s): NA, K, CL, CO2, GLUCOSE, BUN, CREATININE, CALCIUM, MG, AST, ALT, ALKPHOS, BILITOT in the last 168 hours.  Invalid input(s): GFRCGP  Cardiac Enzymes No results for input(s): TROPONINI in the last 168 hours.  Microbiology Results  Results for orders placed or performed during the hospital encounter of 12/09/18  Culture, blood (Routine x 2)     Status: None   Collection Time: 12/09/18  9:25 PM  Result Value Ref Range Status   Specimen Description BLOOD RIGHT ANTECUBITAL  Final   Special Requests   Final    BOTTLES DRAWN AEROBIC AND ANAEROBIC Blood Culture results may not be optimal due to an excessive volume of blood received in culture bottles   Culture   Final    NO GROWTH 5 DAYS Performed at [facility], [address] [place], [place] [address]    Report Status 12/14/2018 FINAL  Final  Culture, blood (Routine x 2)     Status: None   Collection Time: 12/09/18  9:54 PM  Result Value Ref Range Status   Specimen Description BLOOD RIGHT ANTECUBITAL  Final   Special Requests   Final    BOTTLES DRAWN AEROBIC AND ANAEROBIC Blood Culture results may not be optimal due to an excessive volume of blood received in culture bottles   Culture   Final    NO GROWTH 5 DAYS Performed at [facility], [address], [place], [place] [address]    Report Status 12/14/2018 FINAL  Final    RADIOLOGY:  No results  found.  EKG:   Orders placed or performed during the hospital encounter of 12/09/18  . ED EKG  . ED EKG  . EKG 12-Lead  . EKG 12-Lead  . EKG      Management plans discussed with the patient, family and they are in agreement.  CODE STATUS:  Code Status History    Date Active Date Inactive Code Status Order ID Comments User Context   12/10/2018 0047 12/12/2018 2127 Full Code [id number]  Gorden Harms, MD Inpatient   09/03/2018 0037 09/05/2018 1912 Full Code [id number]  Amelia Jo, MD Inpatient   06/06/2018 1529 06/08/2018 1410 Full Code [id number]  Epifanio Lesches, MD ED   02/18/2018 1248 02/19/2018 1854 Full Code [id number]  Idelle Crouch, MD Inpatient   12/30/2017 0305 01/01/2018 1609 Full Code [id number]  Harrie Foreman, MD ED   12/05/2017 1105 12/06/2017 1659 Full Code [id number]  Harrie Foreman, MD ED   07/17/2017 0439 07/18/2017 1759 Full Code [id number]  Harrie Foreman, MD Inpatient   07/01/2017 0119 07/02/2017 1546 Full Code [id number]  Lance Coon, MD Inpatient   04/20/2017 0232 04/21/2017 1816 Full Code [id number]  Saundra Shelling, MD Inpatient   01/31/2016 0143 02/02/2016 2111 Full Code [id number]  Hillary Bow, MD ED   12/12/2015 1627 12/13/2015 1529 Full Code [id number]  [name], MD Inpatient    Advance Directive Documentation     Most Recent Value  Type of Advance Directive  Healthcare Power of Attorney  Pre-existing out of facility DNR order (yellow form or pink MOST form)  -  "MOST" Form in Place?  -      TOTAL TIME TAKING CARE OF THIS PATIENT: 35 minutes.    Vaughan Basta M.D on 12/19/2018 at 9:05 PM  Between 7am to 6pm - Pager - [phone]  After 6pm go to [email or web address] - password [email or web address]  [facility]  Office  [phone]  CC: Primary care physician; [name], MD   Note: This dictation was prepared with Dragon dictation along with smaller phrase technology. Any transcriptional errors that result from this  process are unintentional.

## 2018-12-27 ENCOUNTER — Encounter: Payer: Self-pay | Admitting: Family

## 2018-12-27 ENCOUNTER — Ambulatory Visit: Payer: PPO | Attending: Family | Admitting: Family

## 2018-12-27 VITALS — BP 126/99 | HR 72 | Resp 18 | Ht 67.0 in | Wt 235.1 lb

## 2018-12-27 DIAGNOSIS — Z87891 Personal history of nicotine dependence: Secondary | ICD-10-CM | POA: Diagnosis not present

## 2018-12-27 DIAGNOSIS — D649 Anemia, unspecified: Secondary | ICD-10-CM | POA: Diagnosis not present

## 2018-12-27 DIAGNOSIS — I110 Hypertensive heart disease with heart failure: Secondary | ICD-10-CM | POA: Diagnosis not present

## 2018-12-27 DIAGNOSIS — Z79899 Other long term (current) drug therapy: Secondary | ICD-10-CM | POA: Insufficient documentation

## 2018-12-27 DIAGNOSIS — Z888 Allergy status to other drugs, medicaments and biological substances status: Secondary | ICD-10-CM | POA: Diagnosis not present

## 2018-12-27 DIAGNOSIS — I255 Ischemic cardiomyopathy: Secondary | ICD-10-CM | POA: Insufficient documentation

## 2018-12-27 DIAGNOSIS — E1143 Type 2 diabetes mellitus with diabetic autonomic (poly)neuropathy: Secondary | ICD-10-CM

## 2018-12-27 DIAGNOSIS — Z95810 Presence of automatic (implantable) cardiac defibrillator: Secondary | ICD-10-CM | POA: Insufficient documentation

## 2018-12-27 DIAGNOSIS — Z8546 Personal history of malignant neoplasm of prostate: Secondary | ICD-10-CM | POA: Insufficient documentation

## 2018-12-27 DIAGNOSIS — Z951 Presence of aortocoronary bypass graft: Secondary | ICD-10-CM | POA: Insufficient documentation

## 2018-12-27 DIAGNOSIS — E7800 Pure hypercholesterolemia, unspecified: Secondary | ICD-10-CM | POA: Diagnosis not present

## 2018-12-27 DIAGNOSIS — K219 Gastro-esophageal reflux disease without esophagitis: Secondary | ICD-10-CM | POA: Insufficient documentation

## 2018-12-27 DIAGNOSIS — Z881 Allergy status to other antibiotic agents status: Secondary | ICD-10-CM | POA: Diagnosis not present

## 2018-12-27 DIAGNOSIS — I252 Old myocardial infarction: Secondary | ICD-10-CM | POA: Insufficient documentation

## 2018-12-27 DIAGNOSIS — E119 Type 2 diabetes mellitus without complications: Secondary | ICD-10-CM | POA: Insufficient documentation

## 2018-12-27 DIAGNOSIS — I5022 Chronic systolic (congestive) heart failure: Secondary | ICD-10-CM | POA: Diagnosis not present

## 2018-12-27 DIAGNOSIS — I2510 Atherosclerotic heart disease of native coronary artery without angina pectoris: Secondary | ICD-10-CM | POA: Insufficient documentation

## 2018-12-27 DIAGNOSIS — Z794 Long term (current) use of insulin: Secondary | ICD-10-CM | POA: Insufficient documentation

## 2018-12-27 DIAGNOSIS — Z7982 Long term (current) use of aspirin: Secondary | ICD-10-CM | POA: Diagnosis not present

## 2018-12-27 DIAGNOSIS — Z955 Presence of coronary angioplasty implant and graft: Secondary | ICD-10-CM | POA: Insufficient documentation

## 2018-12-27 DIAGNOSIS — I10 Essential (primary) hypertension: Secondary | ICD-10-CM

## 2018-12-27 DIAGNOSIS — Z833 Family history of diabetes mellitus: Secondary | ICD-10-CM | POA: Diagnosis not present

## 2018-12-27 DIAGNOSIS — Z8249 Family history of ischemic heart disease and other diseases of the circulatory system: Secondary | ICD-10-CM | POA: Diagnosis not present

## 2018-12-27 DIAGNOSIS — E785 Hyperlipidemia, unspecified: Secondary | ICD-10-CM | POA: Insufficient documentation

## 2018-12-27 DIAGNOSIS — J449 Chronic obstructive pulmonary disease, unspecified: Secondary | ICD-10-CM | POA: Diagnosis not present

## 2018-12-27 NOTE — Patient Instructions (Signed)
Continue weighing daily and call for an overnight weight gain of > 2 pounds or a weekly weight gain of >5 pounds. 

## 2018-12-27 NOTE — Progress Notes (Signed)
Patient ID: [name], male    DOB: [birth year], 73 y.o.   MRN: [id number]  HPI  [name] is a 73 y/o male with a history of prostate cancer, DM, hyperlipidemia, HTN, anemia, COPD, GERD, MI, previous tobacco use and chronic heart failure.   Echo report from 09/03/18 reviewed and showed an EF of 20% along with mild TR. Echo report from 02/18/18 reviewed and showed an EF of 20-25% along with trivial AR and moderate Daniel. Echo report from 12/05/17 reviewed and showed an EF of 20-25% along with mild Daniel and moderate Daniel.   Cardiac catheterization done 09/04/18 showed:  ejection fraction of 15% severe 3 vessel coronary artery disease There is occlusion of all native vessels at the ostium Patent graft to obtuse marginal 2 Patent graft to diagonal artery Patent graft to left anterior descending artery There is significant coronary artery disease and or stenosis involving the graft ostium to the right coronary  Stent placed to ostial SVG to RCA.   Cardiac catheterization done 02/02/16 showed severed 3-vessel CAD with patent grafts. Continue medication management along with dual antiplatelet therapy.   Admitted 12/09/2018 due to flu and pneumonia. Given IV steroids, antibiotics and tamiflu. Discharged after 3 days. Was in the ED 09/09/18 due to shortness of breath with a normal exam. Released same day. Admitted 09/02/18 due to chest pain along with HF exacerbation. Cardiology consult obtained. Catheterization/ stent done. Initially needed IV lasix and then transitioned to oral diuretics. Discharged after 3 days.    He presents today for a follow-up visit with a chief complaint of moderate fatigue upon minimal exertion. He describes this as chronic in nature having been present for several years. He has associated cough and shortness of breath along with this. He denies any dizziness, difficulty sleeping, abdominal distention, palpitations, pedal edema, chest pain or weight gain. Has recently been  hospitalized for flu and pneumonia.   Past Medical History:  Diagnosis Date  . Anemia   . Cancer ([place]) 12/2013   prostate  . Cardiogenic pulmonary edema ([place]) 12/19/2014  . Cardiomyopathy, ischemic   . CHF (congestive heart failure) ([place])   . COPD (chronic obstructive pulmonary disease) ([place])   . Diabetes mellitus without complication ([place])   . GERD (gastroesophageal reflux disease)   . Hypercholesteremia   . Hypertension   . Myocardial infarction ([place]) [id number]  . Shortness of breath dyspnea    Past Surgical History:  Procedure Laterality Date  . CARDIAC CATHETERIZATION N/A 02/02/2016   Procedure: Left Heart Cath and Coronary Angiography;  Surgeon: [name], MD;  Location: [place] [facility];  Service: Cardiovascular;  Laterality: N/A;  . CORONARY ANGIOPLASTY WITH STENT PLACEMENT    . CORONARY ARTERY BYPASS GRAFT  11/24/2010  . CORONARY STENT INTERVENTION N/A 09/04/2018   Procedure: CORONARY STENT INTERVENTION;  Surgeon: [name], MD;  Location: [place] [facility];  Service: Cardiovascular;  Laterality: N/A;  . IMPLANTABLE CARDIOVERTER DEFIBRILLATOR (ICD) GENERATOR CHANGE Left 12/12/2015   Procedure: DUAL LEAD PLACEMENT CARDIAC DIFIBRILLATOR;  Surgeon: [name], MD;  Location: [facility];  Service: Cardiovascular;  Laterality: Left;  . LEFT HEART CATH AND CORS/GRAFTS ANGIOGRAPHY N/A 09/04/2018   Procedure: LEFT HEART CATH AND CORS/GRAFTS ANGIOGRAPHY;  Surgeon: [name], MD;  Location: [place] [facility];  Service: Cardiovascular;  Laterality: N/A;  . TONSILLECTOMY     Family History  Problem Relation Age of Onset  . CAD Mother   . Cancer Mother   .  Diabetes Mother   . Alzheimer's disease Father   . Cancer Father   . Heart disease Father    Social History   Tobacco Use  . Smoking status: Former Smoker    Packs/day: 2.00    Years: 30.00    Pack years: 60.00    Last attempt to quit: 12/03/1996    Years since quitting: 22.0  .  Smokeless tobacco: Never Used  Substance Use Topics  . Alcohol use: No    Alcohol/week: 0.0 standard drinks   Allergies  Allergen Reactions  . Brilinta [Ticagrelor] Shortness Of Breath  . Tuberculin Tests Rash  . Benadryl [Diphenhydramine] Other (See Comments)    " Hyperactivity"  . Doxycycline Swelling    Pt went into pulmonary edema.  . Lopid [Gemfibrozil] Swelling    "I gain 1 pound a day for 30 days."   Prior to Admission medications   Medication Sig Start Date End Date Taking? Authorizing Provider  acetaminophen (TYLENOL) 500 MG tablet Take 1,000 mg by mouth daily as needed for moderate pain.   Yes [provider]  albuterol (PROVENTIL HFA;VENTOLIN HFA) 108 (90 Base) MCG/ACT inhaler Inhale 2 puffs into the lungs every 6 (six) hours as needed for wheezing or shortness of breath. 06/04/18  Yes [name], [name], MD  allopurinol (ZYLOPRIM) 100 MG tablet Take 100 mg by mouth daily.   Yes [provider]  aspirin 81 MG chewable tablet Chew 1 tablet (81 mg total) by mouth daily. 09/06/18  Yes Mayo, Pete [name], MD  atorvastatin (LIPITOR) 80 MG tablet Take 1 tablet (80 mg total) by mouth every evening. 04/21/17  Yes Theodoro [name], MD  budesonide-formoterol (SYMBICORT) 80-4.5 MCG/ACT inhaler Inhale 2 puffs into the lungs 2 (two) times daily.   Yes [provider]  calcium carbonate (TUMS - DOSED IN MG ELEMENTAL CALCIUM) 500 MG chewable tablet Chew 1 tablet by mouth as needed for indigestion or heartburn.   Yes [provider]  carvedilol (COREG) 3.125 MG tablet Take 1 tablet (3.125 mg total) by mouth 2 (two) times daily with a meal. 01/01/18  Yes Gladstone [name], MD  clopidogrel (PLAVIX) 75 MG tablet Take 75 mg by mouth daily.   Yes [provider]  co-enzyme Q-10 30 MG capsule Take 30 mg by mouth daily.   Yes [provider]  ferrous sulfate 325 (65 FE) MG tablet Take 325 mg by mouth 2 (two) times daily with a meal.   Yes [provider]  FLUoxetine (PROZAC) 20 MG capsule Take 20 mg by mouth daily.   Yes [provider]  gabapentin (NEURONTIN) 300 MG capsule Take 300 mg by mouth 2 (two) times daily.    Yes [provider]  insulin glargine (LANTUS) 100 UNIT/ML injection Inject 0.8 mLs (80 Units total) into the skin at bedtime. 12/12/18  Yes Vaughan Basta, MD  insulin lispro (HUMALOG) 100 UNIT/ML KiwkPen Inject 0-15 Units into the skin 3 (three) times daily with meals. Per sliding scale   Yes [provider]  loratadine (CLARITIN) 10 MG tablet Take 10 mg by mouth daily.   Yes [provider]  Magnesium Oxide 400 (240 Mg) MG TABS Take 1 tablet (400 mg total) by mouth 2 (two) times daily. 05/30/18  Yes Tyberius Ryner, Otila Kluver A, FNP  Multiple Vitamins-Minerals (CENTRUM SILVER PO) Take 1 tablet by mouth every morning.   Yes [provider]  Multiple Vitamins-Minerals (PRESERVISION AREDS 2) CAPS Take 1 capsule by mouth 2 (two) times daily. For macular  degeneration- eye vitamins   Yes [provider]  nitroGLYCERIN (NITROSTAT) 0.4 MG SL tablet Place 0.4 mg under the tongue every 5 (five) minutes as needed for chest pain.   Yes [provider]  ranolazine (RANEXA) 500 MG 12 hr tablet Take 1 tablet (500 mg total) by mouth 2 (two) times daily. 05/30/18  Yes Darylene Price A, FNP  tamsulosin (FLOMAX) 0.4 MG CAPS capsule Take 0.4 mg by mouth at bedtime.    Yes [provider]  tiotropium (SPIRIVA) 18 MCG inhalation capsule Place 1 capsule (18 mcg total) into inhaler and inhale daily. 06/08/18  Yes Wieting, Richard, MD  guaiFENesin (MUCINEX) 600 MG 12 hr tablet Take 600 mg by mouth 2 (two) times daily as needed.     [provider]  lansoprazole (PREVACID) 15 MG capsule Take 30 mg by mouth 2 (two) times daily.    [provider]    Review of Systems  Constitutional: Positive for fatigue. Negative for appetite change.  HENT: Negative for  congestion, postnasal drip, sore throat and voice change.   Eyes: Negative.   Respiratory: Positive for cough ("some") and shortness of breath. Negative for chest tightness and wheezing.   Cardiovascular: Negative for chest pain, palpitations and leg swelling.  Gastrointestinal: Negative for abdominal distention and abdominal pain.  Endocrine: Negative.   Genitourinary: Negative.   Musculoskeletal: Positive for arthralgias (leg pain when walking long distances).  Skin: Negative.   Allergic/Immunologic: Negative.   Neurological: Negative for dizziness and light-headedness.  Hematological: Negative for adenopathy. Bruises/bleeds easily.  Psychiatric/Behavioral: Negative for dysphoric mood and sleep disturbance (sleeping well with CPAP and oxygen). The patient is not nervous/anxious.    Vitals:   12/27/18 1306  BP: (!) 126/99  Pulse: 72  Resp: 18  SpO2: 100%  Weight: 235 lb 2 oz (106.7 kg)  Height: 5\' 7"  (1.702 m)   Wt Readings from Last 3 Encounters:  12/27/18 235 lb 2 oz (106.7 kg)  12/11/18 227 lb 1.2 oz (103 kg)  11/21/18 234 lb 8 oz (106.4 kg)   Lab Results  Component Value Date   CREATININE 1.64 (H) 12/11/2018   CREATININE 1.80 (H) 12/10/2018   CREATININE 1.65 (H) 12/09/2018    Physical Exam  Constitutional: He is oriented to person, place, and time. He appears well-developed and well-nourished.  HENT:  Head: Normocephalic and atraumatic.  Neck: Normal range of motion. Neck supple. No JVD present.  Cardiovascular: Normal rate and regular rhythm.  Pulmonary/Chest: Effort normal. He has no wheezes. He has no rales.  Abdominal: He exhibits no distension. There is no abdominal tenderness.  Musculoskeletal:        General: No tenderness or edema.  Neurological: He is alert and oriented to person, place, and time.  Skin: Skin is warm and dry.  Psychiatric: He has a normal mood and affect. His behavior is normal. Thought content normal.  Nursing note and vitals  reviewed.  Assessment & Plan:  1: Chronic heart failure with reduced ejection fraction-  - NYHA class III - euvolemic today - has been weighing daily and home weight chart was reviewed. Reminded to call for an overnight weight gain of 2 pounds or > or a weekly weight gain of >5 pounds - weight unchanged from last visit 1 month ago - not adding salt. Eats at [employer] 7 days a week but most days he takes his own breakfast and goes to socialize.  - continues to drink mostly water - saw cardiology Nehemiah Massed) 09/19/18 -  BNP from 09/09/18 was 338.0 - consider trying entresto if BP remains good - titrate carvedilol if able at future visits  2: HTN- - BP looks good today - saw PCP ([place]) 12/18/2018 - BMP from 12/18/2018 reviewed and showed sodium 135, potassium 4.3 creatinine 1.6 and GFR 43   3:Diabetes-  - A1c on 09/20/18 was 6.1% - saw endocrinologist ([place]) 09/27/18 - glucose at home today was 73  Patient's medication list was reviewed.  Return in 2 months or sooner for any questions/problems before next visit.

## 2018-12-28 ENCOUNTER — Emergency Department: Payer: PPO

## 2018-12-28 ENCOUNTER — Emergency Department
Admission: EM | Admit: 2018-12-28 | Discharge: 2018-12-29 | Disposition: A | Discharge status: Home / Self Care | Payer: PPO | Attending: Emergency Medicine | Admitting: Emergency Medicine

## 2018-12-28 DIAGNOSIS — Z794 Long term (current) use of insulin: Secondary | ICD-10-CM | POA: Diagnosis not present

## 2018-12-28 DIAGNOSIS — Z87891 Personal history of nicotine dependence: Secondary | ICD-10-CM | POA: Insufficient documentation

## 2018-12-28 DIAGNOSIS — Z79899 Other long term (current) drug therapy: Secondary | ICD-10-CM | POA: Insufficient documentation

## 2018-12-28 DIAGNOSIS — I10 Essential (primary) hypertension: Secondary | ICD-10-CM | POA: Diagnosis not present

## 2018-12-28 DIAGNOSIS — D649 Anemia, unspecified: Secondary | ICD-10-CM | POA: Insufficient documentation

## 2018-12-28 DIAGNOSIS — J449 Chronic obstructive pulmonary disease, unspecified: Secondary | ICD-10-CM | POA: Diagnosis not present

## 2018-12-28 DIAGNOSIS — I110 Hypertensive heart disease with heart failure: Secondary | ICD-10-CM | POA: Insufficient documentation

## 2018-12-28 DIAGNOSIS — Z7982 Long term (current) use of aspirin: Secondary | ICD-10-CM | POA: Diagnosis not present

## 2018-12-28 DIAGNOSIS — I2510 Atherosclerotic heart disease of native coronary artery without angina pectoris: Secondary | ICD-10-CM | POA: Diagnosis not present

## 2018-12-28 DIAGNOSIS — R0602 Shortness of breath: Secondary | ICD-10-CM | POA: Diagnosis not present

## 2018-12-28 DIAGNOSIS — I509 Heart failure, unspecified: Secondary | ICD-10-CM | POA: Diagnosis not present

## 2018-12-28 DIAGNOSIS — I5022 Chronic systolic (congestive) heart failure: Secondary | ICD-10-CM | POA: Insufficient documentation

## 2018-12-28 DIAGNOSIS — R079 Chest pain, unspecified: Secondary | ICD-10-CM | POA: Insufficient documentation

## 2018-12-28 DIAGNOSIS — Z8546 Personal history of malignant neoplasm of prostate: Secondary | ICD-10-CM | POA: Insufficient documentation

## 2018-12-28 DIAGNOSIS — Z7901 Long term (current) use of anticoagulants: Secondary | ICD-10-CM | POA: Diagnosis not present

## 2018-12-28 LAB — BASIC METABOLIC PANEL
Anion gap: 9 (ref 5–15)
BUN: 18 mg/dL (ref 8–23)
CO2: 23 mmol/L (ref 22–32)
Calcium: 8.7 mg/dL — ABNORMAL LOW (ref 8.9–10.3)
Chloride: 108 mmol/L (ref 98–111)
Creatinine, Ser: 1.02 mg/dL (ref 0.61–1.24)
GFR calc Af Amer: >60 mL/min (ref >60)
GFR calc non Af Amer: >60 mL/min (ref >60)
GLUCOSE: 110 mg/dL — AB (ref 70–99)
Potassium: 4.2 mmol/L (ref 3.5–5.1)
Sodium: 140 mmol/L (ref 135–145)

## 2018-12-28 LAB — TROPONIN I: Troponin I: <0.03 ng/mL (ref <0.03)

## 2018-12-28 LAB — CBC
HCT: 26.9 % — ABNORMAL LOW (ref 39.0–52.0)
Hemoglobin: 8.5 g/dL — ABNORMAL LOW (ref 13.0–17.0)
MCH: 30.6 pg (ref 26.0–34.0)
MCHC: 31.6 g/dL (ref 30.0–36.0)
MCV: 96.8 fL (ref 80.0–100.0)
Platelets: 150 10*3/uL (ref 150–400)
RBC: 2.78 MIL/uL — ABNORMAL LOW (ref 4.22–5.81)
RDW: 15.3 % (ref 11.5–15.5)
WBC: 7 10*3/uL (ref 4.0–10.5)
nRBC: 0 % (ref 0.0–0.2)

## 2018-12-28 LAB — BRAIN NATRIURETIC PEPTIDE: B Natriuretic Peptide: 512 pg/mL — ABNORMAL HIGH (ref 0.0–100.0)

## 2018-12-28 MED ORDER — BUMETANIDE 0.25 MG/ML IJ SOLN
1.0000 mg | Freq: Once | INTRAMUSCULAR | Status: AC
  Administered 2018-12-29: 1 mg via INTRAVENOUS
  Filled 2018-12-28: qty 4

## 2018-12-28 MED ORDER — SODIUM CHLORIDE 0.9% FLUSH
3.0000 mL | Freq: Once | INTRAVENOUS | Status: AC
  Administered 2018-12-29: 3 mL via INTRAVENOUS

## 2018-12-28 NOTE — ED Triage Notes (Signed)
Patient c/o medial chest pain described as pressure. Patient reports accompanying symptoms of SOB, dizziness, nausea, and weakness.

## 2018-12-28 NOTE — ED Provider Notes (Signed)
[facility] Emergency Department Provider Note   ____________________________________________   First MD Initiated Contact with Patient 12/28/18 2349     (approximate)  I have reviewed the triage vital signs and the nursing notes.   HISTORY  Chief Complaint Chest Pain    HPI [name] is a 73 y.o. male who presents to the ED from home with a chief complaint of chest pain and shortness of breath.  Patient was getting ready for bed, lay down and felt chest tightness associated with shortness of breath.  Patient has a history of CHF, COPD, ischemic cardiomyopathy who was hospitalized 1/4-1/7 earlier this month for pneumonia and hypoxia.  States his discontinued spironolactone and torsemide secondary to acute kidney injury.  Had an appointment with heart failure clinic yesterday without changes in his medications.  Denies associated fever, chills, abdominal pain, nausea or vomiting.  States symptoms were brief and have completely resolved now.   Past Medical History:  Diagnosis Date  . Anemia   . Cancer ([place]) 12/2013   prostate  . Cardiogenic pulmonary edema ([place]) 12/19/2014  . Cardiomyopathy, ischemic   . CHF (congestive heart failure) ([place])   . COPD (chronic obstructive pulmonary disease) ([place])   . Diabetes mellitus without complication ([place])   . GERD (gastroesophageal reflux disease)   . Hypercholesteremia   . Hypertension   . Myocardial infarction ([place]) [id number]  . Shortness of breath dyspnea     Patient Active Problem List   Diagnosis Date Noted  . Respiratory failure ([place]) 12/09/2018  . 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 Unstable angina ([place])   . COPD exacerbation ([place]) 06/06/2018  . Acute on chronic systolic CHF (congestive heart failure) ([place]) 04/21/2017  . Hypokalemia 04/21/2017  . Hypomagnesemia 04/21/2017  . Essential hypertension 04/21/2017  . Hyperlipidemia 04/21/2017  . Coronary artery disease 04/21/2017    . COPD with chronic bronchitis ([place]) 02/13/2016  . Obstructive sleep apnea 02/13/2016  . Diabetes ([place]) 02/13/2016  . Chronic systolic HF (heart failure) ([place]) 12/12/2015    Past Surgical History:  Procedure Laterality Date  . CARDIAC CATHETERIZATION N/A 02/02/2016   Procedure: Left Heart Cath and Coronary Angiography;  Surgeon: [name], MD;  Location: [place] [facility];  Service: Cardiovascular;  Laterality: N/A;  . CORONARY ANGIOPLASTY WITH STENT PLACEMENT    . CORONARY ARTERY BYPASS GRAFT  11/24/2010  . CORONARY STENT INTERVENTION N/A 09/04/2018   Procedure: CORONARY STENT INTERVENTION;  Surgeon: [name], MD;  Location: [place] [facility];  Service: Cardiovascular;  Laterality: N/A;  . IMPLANTABLE CARDIOVERTER DEFIBRILLATOR (ICD) GENERATOR CHANGE Left 12/12/2015   Procedure: DUAL LEAD PLACEMENT CARDIAC DIFIBRILLATOR;  Surgeon: [name], MD;  Location: [facility];  Service: Cardiovascular;  Laterality: Left;  . LEFT HEART CATH AND CORS/GRAFTS ANGIOGRAPHY N/A 09/04/2018   Procedure: LEFT HEART CATH AND CORS/GRAFTS ANGIOGRAPHY;  Surgeon: [name], MD;  Location: [place] [facility];  Service: Cardiovascular;  Laterality: N/A;  . TONSILLECTOMY      Prior to Admission medications   Medication Sig Start Date End Date Taking? Authorizing Provider  acetaminophen (TYLENOL) 500 MG tablet Take 1,000 mg by mouth daily as needed for moderate pain.    [provider]  albuterol (PROVENTIL HFA;VENTOLIN HFA) 108 (90 Base) MCG/ACT inhaler Inhale 2 puffs into the lungs every 6 (six) hours as needed for wheezing or shortness of breath. 06/04/18   Rudene Re, MD  allopurinol (ZYLOPRIM) 100 MG tablet Take 100  mg by mouth daily.    [provider]  aspirin 81 MG chewable tablet Chew 1 tablet (81 mg total) by mouth daily. 09/06/18   Mayo, Pete [name], MD  atorvastatin (LIPITOR) 80 MG tablet Take 1 tablet (80 mg total) by mouth every evening. 04/21/17    Theodoro [name], MD  budesonide-formoterol (SYMBICORT) 80-4.5 MCG/ACT inhaler Inhale 2 puffs into the lungs 2 (two) times daily.    [provider]  calcium carbonate (TUMS - DOSED IN MG ELEMENTAL CALCIUM) 500 MG chewable tablet Chew 1 tablet by mouth as needed for indigestion or heartburn.    [provider]  carvedilol (COREG) 3.125 MG tablet Take 1 tablet (3.125 mg total) by mouth 2 (two) times daily with a meal. 01/01/18   Gladstone [name], MD  clopidogrel (PLAVIX) 75 MG tablet Take 75 mg by mouth daily.    [provider]  co-enzyme Q-10 30 MG capsule Take 30 mg by mouth daily.    [provider]  ferrous sulfate 325 (65 FE) MG tablet Take 325 mg by mouth 2 (two) times daily with a meal.    [provider]  FLUoxetine (PROZAC) 20 MG capsule Take 20 mg by mouth daily.    [provider]  gabapentin (NEURONTIN) 300 MG capsule Take 300 mg by mouth 2 (two) times daily.     [provider]  guaiFENesin (MUCINEX) 600 MG 12 hr tablet Take 600 mg by mouth 2 (two) times daily as needed.     [provider]  insulin glargine (LANTUS) 100 UNIT/ML injection Inject 0.8 mLs (80 Units total) into the skin at bedtime. 12/12/18   Vaughan Basta, MD  insulin lispro (HUMALOG) 100 UNIT/ML KiwkPen Inject 0-15 Units into the skin 3 (three) times daily with meals. Per sliding scale    [provider]  lansoprazole (PREVACID) 15 MG capsule Take 30 mg by mouth 2 (two) times daily.    [provider]  loratadine (CLARITIN) 10 MG tablet Take 10 mg by mouth daily.    [provider]  Magnesium Oxide 400 (240 Mg) MG TABS Take 1 tablet (400 mg total) by mouth 2 (two) times daily. 05/30/18   Alisa Graff, FNP  Multiple Vitamins-Minerals (CENTRUM SILVER PO) Take 1 tablet by mouth every morning.    [provider]  Multiple Vitamins-Minerals (PRESERVISION AREDS 2) CAPS Take 1 capsule by mouth 2 (two) times daily.  For macular degeneration- eye vitamins    [provider]  nitroGLYCERIN (NITROSTAT) 0.4 MG SL tablet Place 0.4 mg under the tongue every 5 (five) minutes as needed for chest pain.    [provider]  ranolazine (RANEXA) 500 MG 12 hr tablet Take 1 tablet (500 mg total) by mouth 2 (two) times daily. 05/30/18   Alisa Graff, FNP  tamsulosin (FLOMAX) 0.4 MG CAPS capsule Take 0.4 mg by mouth at bedtime.     [provider]  tiotropium (SPIRIVA) 18 MCG inhalation capsule Place 1 capsule (18 mcg total) into inhaler and inhale daily. 06/08/18   Loletha Grayer, MD    Allergies Brilinta [ticagrelor]; Tuberculin tests; Benadryl [diphenhydramine]; Doxycycline; and Lopid [gemfibrozil]  Family History  Problem Relation Age of Onset  . CAD Mother   . Cancer Mother   . Diabetes Mother   . Alzheimer's disease Father   . Cancer Father   . Heart disease Father     Social History Social History   Tobacco Use  . Smoking status: Former Smoker  Packs/day: 2.00    Years: 30.00    Pack years: 60.00    Last attempt to quit: 12/03/1996    Years since quitting: 22.0  . Smokeless tobacco: Never Used  Substance Use Topics  . Alcohol use: No    Alcohol/week: 0.0 standard drinks  . Drug use: No    Review of Systems  Constitutional: No fever/chills Eyes: No visual changes. ENT: No sore throat. Cardiovascular: Positive for chest pain. Respiratory: Positive for shortness of breath. Gastrointestinal: No abdominal pain.  No nausea, no vomiting.  No diarrhea.  No constipation. Genitourinary: Negative for dysuria. Musculoskeletal: Negative for back pain. Skin: Negative for rash. Neurological: Negative for headaches, focal weakness or numbness.   ____________________________________________   PHYSICAL EXAM:  VITAL SIGNS: ED Triage Vitals [12/28/18 2152]  Enc Vitals Group     BP (!) 150/56     Pulse Rate 95     Resp 17     Temp 98 F (36.7 C)     Temp src       SpO2 95 %     Weight 230 lb (104.3 kg)     Height 5\' 7"  (1.702 m)     Head Circumference      Peak Flow      Pain Score 6     Pain Loc      Pain Edu?      Excl. in [place]?     Constitutional: Alert and oriented. Well appearing and in no acute distress. Eyes: Conjunctivae are normal. PERRL. EOMI. Head: Atraumatic. Nose: No congestion/rhinnorhea. Mouth/Throat: Mucous membranes are moist.  Oropharynx non-erythematous. Neck: No stridor.   Cardiovascular: Normal rate, regular rhythm. Grossly normal heart sounds.  Good peripheral circulation. Respiratory: Normal respiratory effort.  No retractions. Lungs with faint bibasilar rales. Gastrointestinal: Soft and nontender. No distention. No abdominal bruits. No CVA tenderness. Musculoskeletal: No lower extremity tenderness nor edema.  No joint effusions. Neurologic:  Normal speech and language. No gross focal neurologic deficits are appreciated.  Skin:  Skin is warm, dry and intact. No rash noted. Psychiatric: Mood and affect are normal. Speech and behavior are normal.  ____________________________________________   LABS (all labs ordered are listed, but only abnormal results are displayed)  Labs Reviewed  BASIC METABOLIC PANEL - Abnormal; Notable for the following components:      Result Value   Glucose, Bld 110 (*)    Calcium 8.7 (*)    All other components within normal limits  CBC - Abnormal; Notable for the following components:   RBC 2.78 (*)    Hemoglobin 8.5 (*)    HCT 26.9 (*)    All other components within normal limits  BRAIN NATRIURETIC PEPTIDE - Abnormal; Notable for the following components:   B Natriuretic Peptide 512.0 (*)    All other components within normal limits  TROPONIN I - Abnormal; Notable for the following components:   Troponin I 0.03 (*)    All other components within normal limits  TROPONIN I   ____________________________________________  EKG  ED ECG REPORT I, Cinde Ebert J, the attending physician,  personally viewed and interpreted this ECG.   Date: 12/29/2018  EKG Time: 2149  Rate: 98  Rhythm: Paced  Axis: Paced  Intervals:Paced  ST&T Change: Paced  ____________________________________________  RADIOLOGY  ED MD interpretation: Pulmonary vascular congestion  Official radiology report(s): Dg Chest 2 View  Result Date: 12/28/2018 CLINICAL DATA:  Chest pain EXAM: CHEST - 2 VIEW COMPARISON:  12/09/2018, 09/10/2018 FINDINGS: Left-sided pacing device.  Post sternotomy changes. Cardiomegaly with vascular congestion. Mild increased interstitial opacities suggesting edema. Trace pleural effusions. IMPRESSION: Cardiomegaly with vascular congestion, mild interstitial edema and trace pleural effusions. Electronically Signed   By: Donavan Foil M.D.   On: 12/28/2018 22:09    ____________________________________________   PROCEDURES  Procedure(s) performed:   Rectal exam: External exam unremarkable.  Tan stool on gloved finger which is heme-negative.  Procedures  Critical Care performed: No  ____________________________________________   INITIAL IMPRESSION / ASSESSMENT AND PLAN / ED COURSE  As part of my medical decision making, I reviewed the following data within the electronic MEDICAL RECORD NUMBER History obtained from family, Nursing notes reviewed and incorporated, Labs reviewed, EKG interpreted, Old chart reviewed, Radiograph reviewed and Notes from prior ED visits   73 year old male with ischemic cardiomyopathy, CHF, COPD who presents with chest pain and shortness of breath. Differential includes, but is not limited to, viral syndrome, bronchitis including COPD exacerbation, pneumonia, reactive airway disease including asthma, CHF including exacerbation with or without pulmonary/interstitial edema, pneumothorax, ACS, thoracic trauma, and pulmonary embolism.  Without intervention patient states all symptoms have resolved.  I personally viewed patient's old records and see that  he had an acute drop in his H/H around the time of his last hospitalization.  Patient states he was not transfused blood and he has a prior history of anemia.  He was guaiac negative on examination.  Patient was recently taken off his torsemide and spironolactone.  Will check repeat troponin, administer 1 mg IV Bumex for diuresis, ambulate patient.  He desires to be discharged home if possible.  Clinical Course as of Dec 29 198  Fri Dec 29, 2018  0147 Urine output 800 mL.  Patient currently denies chest pain or shortness of breath.  Will ambulate with pulse oximeter.  Updated patient and spouse on repeat troponin which is clinically insignificant.     [JS]  5997 Patient ambulated with pulse oximeter; room air saturations maintained at 97%.  Patient was not tachypneic and did not experience exertional chest pain.  Eager for discharge home.  I have advised him to contact the heart failure clinic today as I suspect he needs to be on some degree of diuretic.  Strict return precautions given.  Patient and spouse verbalized understanding and agree with plan of care.   [JS]    Clinical Course User Index [JS] Paulette Blanch, MD     ____________________________________________   FINAL CLINICAL IMPRESSION(S) / ED DIAGNOSES  Final diagnoses:  Nonspecific chest pain  Acute on chronic congestive heart failure, unspecified heart failure type ([place])  Anemia, unspecified type     ED Discharge Orders    None       Note:  This document was prepared using Dragon voice recognition software and may include unintentional dictation errors.    Paulette Blanch, MD 12/29/18 [phone]

## 2018-12-29 ENCOUNTER — Telehealth: Payer: Self-pay | Admitting: Family

## 2018-12-29 LAB — TROPONIN I: Troponin I: 0.03 ng/mL (ref <0.03)

## 2018-12-29 NOTE — Discharge Instructions (Addendum)
Please call Ms. Hackney at the heart failure clinic today.  Most likely you need to restart a diuretic.  Return to the ER for worsening symptoms, persistent vomiting, difficulty breathing or other concerns.

## 2018-12-29 NOTE — ED Notes (Signed)
Date and time results received: 12/29/18 0106 (use smartphrase ".now" to insert current time)  Test:Trop I Critical Value: 0.03  Name of Provider Notified: Beather Arbour  Orders Received? Or Actions Taken?: No new orders at this time

## 2018-12-29 NOTE — Telephone Encounter (Signed)
Called patient after receiving a message from the ED provider that patient was in the ED last night.   Spoke with patient and his wife and advised him to resume his torsemide at 20mg  once daily. Will see patient in clinic on 01/03/2019 and check a BMP at that time. Need to evaluate renal function and possible need for potassium supplements.   Patient and wife verbalized understanding.

## 2019-01-02 NOTE — Progress Notes (Signed)
Patient ID: [name], male    DOB: 28-Oct-1946, 73 y.o.   MRN: [id number]  HPI  [name] is a 73 y/o male with a history of prostate cancer, DM, hyperlipidemia, HTN, anemia, COPD, GERD, MI, previous tobacco use and chronic heart failure.   Echo report from 09/03/18 reviewed and showed an EF of 20% along with mild TR. Echo report from 02/18/18 reviewed and showed an EF of 20-25% along with trivial AR and moderate Daniel. Echo report from 12/05/17 reviewed and showed an EF of 20-25% along with mild Daniel and moderate Daniel.   Cardiac catheterization done 09/04/18 showed:  ejection fraction of 15% severe 3 vessel coronary artery disease There is occlusion of all native vessels at the ostium Patent graft to obtuse marginal 2 Patent graft to diagonal artery Patent graft to left anterior descending artery There is significant coronary artery disease and or stenosis involving the graft ostium to the right coronary  Stent placed to ostial SVG to RCA.   Cardiac catheterization done 02/02/16 showed severed 3-vessel CAD with patent grafts. Continue medication management along with dual antiplatelet therapy.   Was in the ED 12/28/2018 due to chest pain and HF exacerbation. Given IV diuretics and released. Admitted 12/09/2018 due to flu and pneumonia. Given IV steroids, antibiotics and tamiflu. Discharged after 3 days. Was in the ED 09/09/18 due to shortness of breath with a normal exam. Released same day. Admitted 09/02/18 due to chest pain along with HF exacerbation. Cardiology consult obtained. Catheterization/ stent done. Initially needed IV lasix and then transitioned to oral diuretics. Discharged after 3 days.    He presents today for a follow-up visit with a chief complaint of moderate shortness of breath upon minimal exertion. He describes this as chronic in nature having been present for several years although has worsened over the last week. He has associated cough and fatigue along with this. He denies  any difficulty sleeping, dizziness, abdominal distention, palpitations, pedal edema, chest pain, wheezing or weight gain. Resumed torsemide 20mg  daily on 12/29/2018. Did take an additional 20mg  torsemide 2 days ago due to weight gain.   Past Medical History:  Diagnosis Date  . Anemia   . Cancer ([place]) 12/2013   prostate  . Cardiogenic pulmonary edema ([place]) 12/19/2014  . Cardiomyopathy, ischemic   . CHF (congestive heart failure) ([place])   . COPD (chronic obstructive pulmonary disease) ([place])   . Diabetes mellitus without complication ([place])   . GERD (gastroesophageal reflux disease)   . Hypercholesteremia   . Hypertension   . Myocardial infarction ([place]) [id number]  . Shortness of breath dyspnea    Past Surgical History:  Procedure Laterality Date  . CARDIAC CATHETERIZATION N/A 02/02/2016   Procedure: Left Heart Cath and Coronary Angiography;  Surgeon: [name], MD;  Location: [facility];  Service: Cardiovascular;  Laterality: N/A;  . CORONARY ANGIOPLASTY WITH STENT PLACEMENT    . CORONARY ARTERY BYPASS GRAFT  11/24/2010  . CORONARY STENT INTERVENTION N/A 09/04/2018   Procedure: CORONARY STENT INTERVENTION;  Surgeon: [name], MD;  Location: [facility];  Service: Cardiovascular;  Laterality: N/A;  . IMPLANTABLE CARDIOVERTER DEFIBRILLATOR (ICD) GENERATOR CHANGE Left 12/12/2015   Procedure: DUAL LEAD PLACEMENT CARDIAC DIFIBRILLATOR;  Surgeon: [name], MD;  Location: [facility];  Service: Cardiovascular;  Laterality: Left;  . LEFT HEART CATH AND CORS/GRAFTS ANGIOGRAPHY N/A 09/04/2018   Procedure: LEFT HEART CATH AND CORS/GRAFTS ANGIOGRAPHY;  Surgeon: [name], MD;  Location:  [place] [facility];  Service: Cardiovascular;  Laterality: N/A;  . TONSILLECTOMY     Family History  Problem Relation Age of Onset  . CAD Mother   . Cancer Mother   . Diabetes Mother   . Alzheimer's disease Father   . Cancer Father   . Heart disease Father    Social  History   Tobacco Use  . Smoking status: Former Smoker    Packs/day: 2.00    Years: 30.00    Pack years: 60.00    Last attempt to quit: 12/03/1996    Years since quitting: 22.0  . Smokeless tobacco: Never Used  Substance Use Topics  . Alcohol use: No    Alcohol/week: 0.0 standard drinks   Allergies  Allergen Reactions  . Brilinta [Ticagrelor] Shortness Of Breath  . Tuberculin Tests Rash  . Benadryl [Diphenhydramine] Other (See Comments)    " Hyperactivity"  . Doxycycline Swelling    Pt went into pulmonary edema.  . Lopid [Gemfibrozil] Swelling    "I gain 1 pound a day for 30 days."   Prior to Admission medications   Medication Sig Start Date End Date Taking? Authorizing Provider  acetaminophen (TYLENOL) 500 MG tablet Take 1,000 mg by mouth daily as needed for moderate pain.   Yes [provider]  albuterol (PROVENTIL HFA;VENTOLIN HFA) 108 (90 Base) MCG/ACT inhaler Inhale 2 puffs into the lungs every 6 (six) hours as needed for wheezing or shortness of breath. 06/04/18  Yes [name], [name], MD  allopurinol (ZYLOPRIM) 100 MG tablet Take 100 mg by mouth daily.   Yes [provider]  aspirin 81 MG chewable tablet Chew 1 tablet (81 mg total) by mouth daily. 09/06/18  Yes Mayo, Pete [name], MD  atorvastatin (LIPITOR) 80 MG tablet Take 1 tablet (80 mg total) by mouth every evening. 04/21/17  Yes Theodoro [name], MD  budesonide-formoterol (SYMBICORT) 80-4.5 MCG/ACT inhaler Inhale 2 puffs into the lungs 2 (two) times daily.   Yes [provider]  calcium carbonate (TUMS - DOSED IN MG ELEMENTAL CALCIUM) 500 MG chewable tablet Chew 1 tablet by mouth as needed for indigestion or heartburn.   Yes [provider]  carvedilol (COREG) 3.125 MG tablet Take 1 tablet (3.125 mg total) by mouth 2 (two) times daily with a meal. 01/01/18  Yes Gladstone [name], MD  clopidogrel (PLAVIX) 75 MG tablet Take 75 mg by mouth daily.   Yes [provider]  co-enzyme Q-10  30 MG capsule Take 30 mg by mouth daily.   Yes [provider]  ferrous sulfate 325 (65 FE) MG tablet Take 325 mg by mouth 2 (two) times daily with a meal.   Yes [provider]  FLUoxetine (PROZAC) 20 MG capsule Take 20 mg by mouth daily.   Yes [provider]  gabapentin (NEURONTIN) 300 MG capsule Take 300 mg by mouth 2 (two) times daily.    Yes [provider]  guaiFENesin (MUCINEX) 600 MG 12 hr tablet Take 600 mg by mouth 2 (two) times daily as needed.    Yes [provider]  insulin glargine (LANTUS) 100 UNIT/ML injection Inject 0.8 mLs (80 Units total) into the skin at bedtime. 12/12/18  Yes Vaughan Basta, MD  insulin lispro (HUMALOG) 100 UNIT/ML KiwkPen Inject 0-15 Units into the skin 3 (three) times daily with meals. Per sliding scale   Yes [provider]  lansoprazole (PREVACID) 15 MG capsule Take 30 mg by mouth 2 (two) times daily.   Yes [provider]  loratadine (CLARITIN) 10 MG tablet Take 10 mg by mouth daily.   Yes [provider]  Magnesium Oxide 400 (240 Mg) MG TABS Take 1 tablet (400 mg total) by mouth 2 (two) times daily. 05/30/18  Yes Hackney, Otila Kluver A, FNP  Multiple Vitamins-Minerals (CENTRUM SILVER PO) Take 1 tablet by mouth every morning.   Yes [provider]  Multiple Vitamins-Minerals (PRESERVISION AREDS 2) CAPS Take 1 capsule by mouth 2 (two) times daily. For macular degeneration- eye vitamins   Yes [provider]  nitroGLYCERIN (NITROSTAT) 0.4 MG SL tablet Place 0.4 mg under the tongue every 5 (five) minutes as needed for chest pain.   Yes [provider]  ranolazine (RANEXA) 500 MG 12 hr tablet Take 1 tablet (500 mg total) by mouth 2 (two) times daily. 05/30/18  Yes Darylene Price A, FNP  tamsulosin (FLOMAX) 0.4 MG CAPS capsule Take 0.4 mg by mouth at bedtime.    Yes [provider]  tiotropium (SPIRIVA) 18 MCG inhalation capsule Place 1 capsule (18 mcg total)  into inhaler and inhale daily. 06/08/18  Yes Loletha Grayer, MD    Review of Systems  Constitutional: Positive for fatigue. Negative for appetite change.  HENT: Negative for congestion, postnasal drip, sore throat and voice change.   Eyes: Negative.   Respiratory: Positive for cough ("some") and shortness of breath (worsening). Negative for chest tightness and wheezing.   Cardiovascular: Negative for chest pain, palpitations and leg swelling.  Gastrointestinal: Negative for abdominal distention and abdominal pain.       Acid reflux pain worse  Endocrine: Negative.   Genitourinary: Negative.   Musculoskeletal: Positive for arthralgias (leg pain when walking long distances).  Skin: Negative.   Allergic/Immunologic: Negative.   Neurological: Negative for dizziness and light-headedness.  Hematological: Negative for adenopathy. Bruises/bleeds easily.  Psychiatric/Behavioral: Negative for dysphoric mood and sleep disturbance (sleeping well with CPAP and oxygen). The patient is not nervous/anxious.    Vitals:   01/03/19 1305  BP: (!) 115/59  Pulse: 76  Resp: 18  Temp: 98.2 F (36.8 C)  TempSrc: Oral  SpO2: 96%  Weight: 236 lb 12.8 oz (107.4 kg)  Height: 5\' 7"  (1.702 m)   Wt Readings from Last 3 Encounters:  01/03/19 236 lb 12.8 oz (107.4 kg)  12/28/18 230 lb (104.3 kg)  12/27/18 235 lb 2 oz (106.7 kg)    Lab Results  Component Value Date   CREATININE 1.02 12/28/2018   CREATININE 1.64 (H) 12/11/2018   CREATININE 1.80 (H) 12/10/2018    Physical Exam  Constitutional: He is oriented to person, place, and time. He appears well-developed and well-nourished.  HENT:  Head: Normocephalic and atraumatic.  Neck: Normal range of motion. Neck supple. No JVD present.  Cardiovascular: Normal rate and regular rhythm.  Pulmonary/Chest: Effort normal. He has no wheezes. He has no rales.  Abdominal: He exhibits no distension. There is no abdominal tenderness.  Musculoskeletal:         General: No tenderness or edema.  Neurological: He is alert and oriented to person, place, and time.  Skin: Skin is warm and dry.  Psychiatric: He has a normal mood and affect. His behavior is normal. Thought content normal.  Nursing note and vitals reviewed.  Assessment & Plan:  1: Chronic heart failure with reduced ejection fraction-  - NYHA class III - euvolemic today - has been weighing daily and home weight chart was reviewed. Reminded to call for an overnight weight gain of 2  pounds or > or a weekly weight gain of >5 pounds - weight stable from last visit here 1 week ago - resumed torsemide 20mg  daily after recent ED visit; advised him to now take 20mg  torsemide QOD alternating with 40mg  QOD - will get BMP today and recheck it next week as well since increasing diuretic today - not adding salt but recently had hot dog and chili cheese fries for lunch - continues to drink mostly water - saw cardiology Nehemiah Massed) 09/19/18 - BNP from 12/28/2018 was 512.0 - consider trying entresto if BP remains good - titrate carvedilol if able at future visits  2: HTN- - BP looks good today - saw PCP ([place]) 12/18/2018 - BMP from 12/28/2018 reviewed and showed sodium 140, potassium 4.2 creatinine 1.02 and GFR >60   3:Diabetes-  - A1c on 09/20/18 was 6.1% - saw endocrinologist ([place]) 09/27/18 - glucose at home today was 76   Patient's medication list was reviewed.  Return in 1 week or sooner for any questions/problems before then.

## 2019-01-03 ENCOUNTER — Other Ambulatory Visit: Payer: Self-pay

## 2019-01-03 ENCOUNTER — Ambulatory Visit: Payer: PPO | Attending: Family | Admitting: Family

## 2019-01-03 ENCOUNTER — Encounter: Payer: Self-pay | Admitting: Family

## 2019-01-03 ENCOUNTER — Other Ambulatory Visit: Payer: Self-pay | Admitting: *Deleted

## 2019-01-03 VITALS — BP 115/59 | HR 76 | Temp 98.2°F | Resp 18 | Ht 67.0 in | Wt 236.8 lb

## 2019-01-03 DIAGNOSIS — D649 Anemia, unspecified: Secondary | ICD-10-CM | POA: Insufficient documentation

## 2019-01-03 DIAGNOSIS — Z794 Long term (current) use of insulin: Secondary | ICD-10-CM | POA: Diagnosis not present

## 2019-01-03 DIAGNOSIS — Z87891 Personal history of nicotine dependence: Secondary | ICD-10-CM | POA: Insufficient documentation

## 2019-01-03 DIAGNOSIS — I5022 Chronic systolic (congestive) heart failure: Secondary | ICD-10-CM | POA: Diagnosis not present

## 2019-01-03 DIAGNOSIS — E1143 Type 2 diabetes mellitus with diabetic autonomic (poly)neuropathy: Secondary | ICD-10-CM

## 2019-01-03 DIAGNOSIS — Z8546 Personal history of malignant neoplasm of prostate: Secondary | ICD-10-CM | POA: Diagnosis not present

## 2019-01-03 DIAGNOSIS — K219 Gastro-esophageal reflux disease without esophagitis: Secondary | ICD-10-CM | POA: Diagnosis not present

## 2019-01-03 DIAGNOSIS — Z888 Allergy status to other drugs, medicaments and biological substances status: Secondary | ICD-10-CM | POA: Insufficient documentation

## 2019-01-03 DIAGNOSIS — E119 Type 2 diabetes mellitus without complications: Secondary | ICD-10-CM | POA: Diagnosis not present

## 2019-01-03 DIAGNOSIS — E7800 Pure hypercholesterolemia, unspecified: Secondary | ICD-10-CM | POA: Insufficient documentation

## 2019-01-03 DIAGNOSIS — Z883 Allergy status to other anti-infective agents status: Secondary | ICD-10-CM | POA: Diagnosis not present

## 2019-01-03 DIAGNOSIS — I110 Hypertensive heart disease with heart failure: Secondary | ICD-10-CM | POA: Insufficient documentation

## 2019-01-03 DIAGNOSIS — Z881 Allergy status to other antibiotic agents status: Secondary | ICD-10-CM | POA: Insufficient documentation

## 2019-01-03 DIAGNOSIS — I255 Ischemic cardiomyopathy: Secondary | ICD-10-CM | POA: Diagnosis not present

## 2019-01-03 DIAGNOSIS — Z79899 Other long term (current) drug therapy: Secondary | ICD-10-CM | POA: Diagnosis not present

## 2019-01-03 DIAGNOSIS — Z951 Presence of aortocoronary bypass graft: Secondary | ICD-10-CM | POA: Diagnosis not present

## 2019-01-03 DIAGNOSIS — I2510 Atherosclerotic heart disease of native coronary artery without angina pectoris: Secondary | ICD-10-CM | POA: Diagnosis not present

## 2019-01-03 DIAGNOSIS — Z809 Family history of malignant neoplasm, unspecified: Secondary | ICD-10-CM | POA: Insufficient documentation

## 2019-01-03 DIAGNOSIS — Z833 Family history of diabetes mellitus: Secondary | ICD-10-CM | POA: Diagnosis not present

## 2019-01-03 DIAGNOSIS — I252 Old myocardial infarction: Secondary | ICD-10-CM | POA: Insufficient documentation

## 2019-01-03 DIAGNOSIS — Z791 Long term (current) use of non-steroidal anti-inflammatories (NSAID): Secondary | ICD-10-CM | POA: Diagnosis not present

## 2019-01-03 DIAGNOSIS — Z955 Presence of coronary angioplasty implant and graft: Secondary | ICD-10-CM | POA: Diagnosis not present

## 2019-01-03 DIAGNOSIS — Z8249 Family history of ischemic heart disease and other diseases of the circulatory system: Secondary | ICD-10-CM | POA: Insufficient documentation

## 2019-01-03 DIAGNOSIS — J449 Chronic obstructive pulmonary disease, unspecified: Secondary | ICD-10-CM | POA: Insufficient documentation

## 2019-01-03 DIAGNOSIS — Z7982 Long term (current) use of aspirin: Secondary | ICD-10-CM | POA: Diagnosis not present

## 2019-01-03 DIAGNOSIS — I10 Essential (primary) hypertension: Secondary | ICD-10-CM

## 2019-01-03 LAB — BASIC METABOLIC PANEL
ANION GAP: 8 (ref 5–15)
BUN: 15 mg/dL (ref 8–23)
CO2: 26 mmol/L (ref 22–32)
Calcium: 8.4 mg/dL — ABNORMAL LOW (ref 8.9–10.3)
Chloride: 105 mmol/L (ref 98–111)
Creatinine, Ser: 1.23 mg/dL (ref 0.61–1.24)
GFR calc Af Amer: >60 mL/min (ref >60)
GFR calc non Af Amer: 58 mL/min — ABNORMAL LOW (ref >60)
GLUCOSE: 206 mg/dL — AB (ref 70–99)
Potassium: 3.7 mmol/L (ref 3.5–5.1)
Sodium: 139 mmol/L (ref 135–145)

## 2019-01-03 NOTE — Patient Outreach (Signed)
HTA High Risk Referral screening.  [name] has previously been involved with all roles in [facility] care management. Most recently our pharmacist was engaged to review his medications post hospitalization. He has history with [place] since 2017. He has not been involved with a nurse for over a year and has had several hospitalizations recently for chest pain, heart failure, pneumonia, SOB, acute kidney injury. He is also followed for hx of prostate ca by radiation oncology.  [name] saw his cardiology NP today. She resumed his torsemide on an alternating schedule of 20 mg every other day with 40 mg every other day. This had been held due to acute kidney injury.  Pt reports he does not get edema with heart failure exacerbation. His waistline expands and his wt does go up.  He readily agrees to participating with one of our community care managers. I advised our nurse would contact him within the week.  Referral made.  Eulah Pont. Myrtie Neither, MSN, [facility] Gerontological Nurse Practitioner [facility] Care Management [phone]

## 2019-01-03 NOTE — Patient Instructions (Addendum)
Continue weighing daily and call for an overnight weight gain of > 2 pounds or a weekly weight gain of >5 pounds.  Change torsemide to 20mg  every other day alternating with 40mg  every other day  You see Dr. Nehemiah Massed on February 12th at 2:15pm.   Please call Dr. Lenn Cal office to find out when your next appointment with him is.

## 2019-01-04 ENCOUNTER — Encounter: Payer: Self-pay | Admitting: Family

## 2019-01-07 ENCOUNTER — Other Ambulatory Visit: Payer: Self-pay

## 2019-01-07 ENCOUNTER — Emergency Department: Payer: PPO

## 2019-01-07 ENCOUNTER — Encounter: Payer: Self-pay | Admitting: Intensive Care

## 2019-01-07 DIAGNOSIS — Z79899 Other long term (current) drug therapy: Secondary | ICD-10-CM

## 2019-01-07 DIAGNOSIS — Z8249 Family history of ischemic heart disease and other diseases of the circulatory system: Secondary | ICD-10-CM

## 2019-01-07 DIAGNOSIS — G4733 Obstructive sleep apnea (adult) (pediatric): Secondary | ICD-10-CM | POA: Diagnosis present

## 2019-01-07 DIAGNOSIS — I5023 Acute on chronic systolic (congestive) heart failure: Secondary | ICD-10-CM | POA: Diagnosis present

## 2019-01-07 DIAGNOSIS — M109 Gout, unspecified: Secondary | ICD-10-CM | POA: Diagnosis not present

## 2019-01-07 DIAGNOSIS — D509 Iron deficiency anemia, unspecified: Secondary | ICD-10-CM | POA: Diagnosis present

## 2019-01-07 DIAGNOSIS — K219 Gastro-esophageal reflux disease without esophagitis: Secondary | ICD-10-CM | POA: Diagnosis not present

## 2019-01-07 DIAGNOSIS — I252 Old myocardial infarction: Secondary | ICD-10-CM

## 2019-01-07 DIAGNOSIS — Z6835 Body mass index (BMI) 35.0-35.9, adult: Secondary | ICD-10-CM

## 2019-01-07 DIAGNOSIS — R0602 Shortness of breath: Secondary | ICD-10-CM | POA: Diagnosis not present

## 2019-01-07 DIAGNOSIS — E1142 Type 2 diabetes mellitus with diabetic polyneuropathy: Secondary | ICD-10-CM | POA: Diagnosis not present

## 2019-01-07 DIAGNOSIS — I2510 Atherosclerotic heart disease of native coronary artery without angina pectoris: Secondary | ICD-10-CM | POA: Diagnosis present

## 2019-01-07 DIAGNOSIS — I255 Ischemic cardiomyopathy: Secondary | ICD-10-CM | POA: Diagnosis present

## 2019-01-07 DIAGNOSIS — Z7983 Long term (current) use of bisphosphonates: Secondary | ICD-10-CM | POA: Diagnosis not present

## 2019-01-07 DIAGNOSIS — E785 Hyperlipidemia, unspecified: Secondary | ICD-10-CM | POA: Diagnosis present

## 2019-01-07 DIAGNOSIS — I110 Hypertensive heart disease with heart failure: Secondary | ICD-10-CM | POA: Diagnosis not present

## 2019-01-07 DIAGNOSIS — Z888 Allergy status to other drugs, medicaments and biological substances status: Secondary | ICD-10-CM

## 2019-01-07 DIAGNOSIS — Z7982 Long term (current) use of aspirin: Secondary | ICD-10-CM | POA: Diagnosis not present

## 2019-01-07 DIAGNOSIS — E669 Obesity, unspecified: Secondary | ICD-10-CM | POA: Diagnosis present

## 2019-01-07 DIAGNOSIS — J449 Chronic obstructive pulmonary disease, unspecified: Secondary | ICD-10-CM | POA: Diagnosis not present

## 2019-01-07 DIAGNOSIS — Z7902 Long term (current) use of antithrombotics/antiplatelets: Secondary | ICD-10-CM

## 2019-01-07 DIAGNOSIS — Z881 Allergy status to other antibiotic agents status: Secondary | ICD-10-CM

## 2019-01-07 DIAGNOSIS — I472 Ventricular tachycardia: Secondary | ICD-10-CM | POA: Diagnosis not present

## 2019-01-07 DIAGNOSIS — Z833 Family history of diabetes mellitus: Secondary | ICD-10-CM

## 2019-01-07 DIAGNOSIS — Z951 Presence of aortocoronary bypass graft: Secondary | ICD-10-CM

## 2019-01-07 DIAGNOSIS — Z794 Long term (current) use of insulin: Secondary | ICD-10-CM

## 2019-01-07 DIAGNOSIS — Z87891 Personal history of nicotine dependence: Secondary | ICD-10-CM

## 2019-01-07 DIAGNOSIS — F329 Major depressive disorder, single episode, unspecified: Secondary | ICD-10-CM | POA: Diagnosis present

## 2019-01-07 DIAGNOSIS — R0902 Hypoxemia: Secondary | ICD-10-CM | POA: Diagnosis present

## 2019-01-07 DIAGNOSIS — Z95810 Presence of automatic (implantable) cardiac defibrillator: Secondary | ICD-10-CM

## 2019-01-07 DIAGNOSIS — R079 Chest pain, unspecified: Secondary | ICD-10-CM | POA: Diagnosis not present

## 2019-01-07 DIAGNOSIS — I509 Heart failure, unspecified: Secondary | ICD-10-CM | POA: Diagnosis not present

## 2019-01-07 DIAGNOSIS — Z515 Encounter for palliative care: Secondary | ICD-10-CM | POA: Diagnosis not present

## 2019-01-07 DIAGNOSIS — Z7189 Other specified counseling: Secondary | ICD-10-CM | POA: Diagnosis not present

## 2019-01-07 DIAGNOSIS — Z955 Presence of coronary angioplasty implant and graft: Secondary | ICD-10-CM

## 2019-01-07 LAB — CBC
HCT: 29 % — ABNORMAL LOW (ref 39.0–52.0)
Hemoglobin: 9.2 g/dL — ABNORMAL LOW (ref 13.0–17.0)
MCH: 30.3 pg (ref 26.0–34.0)
MCHC: 31.7 g/dL (ref 30.0–36.0)
MCV: 95.4 fL (ref 80.0–100.0)
Platelets: 255 10*3/uL (ref 150–400)
RBC: 3.04 MIL/uL — AB (ref 4.22–5.81)
RDW: 15.5 % (ref 11.5–15.5)
WBC: 5.7 10*3/uL (ref 4.0–10.5)
nRBC: 0 % (ref 0.0–0.2)

## 2019-01-07 LAB — BASIC METABOLIC PANEL
Anion gap: 8 (ref 5–15)
BUN: 15 mg/dL (ref 8–23)
CALCIUM: 8.5 mg/dL — AB (ref 8.9–10.3)
CO2: 27 mmol/L (ref 22–32)
Chloride: 106 mmol/L (ref 98–111)
Creatinine, Ser: 1.12 mg/dL (ref 0.61–1.24)
GFR calc Af Amer: >60 mL/min (ref >60)
GFR calc non Af Amer: >60 mL/min (ref >60)
GLUCOSE: 126 mg/dL — AB (ref 70–99)
Potassium: 3.7 mmol/L (ref 3.5–5.1)
Sodium: 141 mmol/L (ref 135–145)

## 2019-01-07 LAB — PROTIME-INR
INR: 1.09
Prothrombin Time: 14 seconds (ref 11.4–15.2)

## 2019-01-07 LAB — TROPONIN I: Troponin I: <0.03 ng/mL (ref <0.03)

## 2019-01-07 NOTE — ED Triage Notes (Signed)
Patient c/o central pressure in chest with SOB all day. Patient states the SOB is why he came in to be seen. Admitted in January for flu and pneumonia. Reports all afternoon he has had his cpap on with 5L O2 with no relief of SOB.

## 2019-01-08 ENCOUNTER — Other Ambulatory Visit: Payer: Self-pay

## 2019-01-08 ENCOUNTER — Inpatient Hospital Stay
Admission: EM | Admit: 2019-01-08 | Discharge: 2019-01-10 | DRG: 292 | Disposition: A | Discharge status: Home / Self Care | Payer: PPO | Attending: Internal Medicine | Admitting: Internal Medicine

## 2019-01-08 DIAGNOSIS — I509 Heart failure, unspecified: Secondary | ICD-10-CM

## 2019-01-08 DIAGNOSIS — G4733 Obstructive sleep apnea (adult) (pediatric): Secondary | ICD-10-CM | POA: Diagnosis present

## 2019-01-08 DIAGNOSIS — Z7902 Long term (current) use of antithrombotics/antiplatelets: Secondary | ICD-10-CM | POA: Diagnosis not present

## 2019-01-08 DIAGNOSIS — Z955 Presence of coronary angioplasty implant and graft: Secondary | ICD-10-CM | POA: Diagnosis not present

## 2019-01-08 DIAGNOSIS — R0902 Hypoxemia: Secondary | ICD-10-CM | POA: Diagnosis present

## 2019-01-08 DIAGNOSIS — E785 Hyperlipidemia, unspecified: Secondary | ICD-10-CM | POA: Diagnosis present

## 2019-01-08 DIAGNOSIS — M109 Gout, unspecified: Secondary | ICD-10-CM | POA: Diagnosis present

## 2019-01-08 DIAGNOSIS — Z951 Presence of aortocoronary bypass graft: Secondary | ICD-10-CM | POA: Diagnosis not present

## 2019-01-08 DIAGNOSIS — I255 Ischemic cardiomyopathy: Secondary | ICD-10-CM | POA: Diagnosis present

## 2019-01-08 DIAGNOSIS — Z7983 Long term (current) use of bisphosphonates: Secondary | ICD-10-CM | POA: Diagnosis not present

## 2019-01-08 DIAGNOSIS — D509 Iron deficiency anemia, unspecified: Secondary | ICD-10-CM | POA: Diagnosis present

## 2019-01-08 DIAGNOSIS — E1142 Type 2 diabetes mellitus with diabetic polyneuropathy: Secondary | ICD-10-CM | POA: Diagnosis present

## 2019-01-08 DIAGNOSIS — I252 Old myocardial infarction: Secondary | ICD-10-CM | POA: Diagnosis not present

## 2019-01-08 DIAGNOSIS — I472 Ventricular tachycardia: Secondary | ICD-10-CM | POA: Diagnosis not present

## 2019-01-08 DIAGNOSIS — I110 Hypertensive heart disease with heart failure: Secondary | ICD-10-CM | POA: Diagnosis present

## 2019-01-08 DIAGNOSIS — K219 Gastro-esophageal reflux disease without esophagitis: Secondary | ICD-10-CM | POA: Diagnosis present

## 2019-01-08 DIAGNOSIS — I5023 Acute on chronic systolic (congestive) heart failure: Secondary | ICD-10-CM | POA: Diagnosis present

## 2019-01-08 DIAGNOSIS — Z8249 Family history of ischemic heart disease and other diseases of the circulatory system: Secondary | ICD-10-CM | POA: Diagnosis not present

## 2019-01-08 DIAGNOSIS — Z7189 Other specified counseling: Secondary | ICD-10-CM | POA: Diagnosis not present

## 2019-01-08 DIAGNOSIS — Z95810 Presence of automatic (implantable) cardiac defibrillator: Secondary | ICD-10-CM | POA: Diagnosis not present

## 2019-01-08 DIAGNOSIS — J449 Chronic obstructive pulmonary disease, unspecified: Secondary | ICD-10-CM | POA: Diagnosis present

## 2019-01-08 DIAGNOSIS — Z833 Family history of diabetes mellitus: Secondary | ICD-10-CM | POA: Diagnosis not present

## 2019-01-08 DIAGNOSIS — E669 Obesity, unspecified: Secondary | ICD-10-CM | POA: Diagnosis present

## 2019-01-08 DIAGNOSIS — I2510 Atherosclerotic heart disease of native coronary artery without angina pectoris: Secondary | ICD-10-CM | POA: Diagnosis present

## 2019-01-08 DIAGNOSIS — Z7982 Long term (current) use of aspirin: Secondary | ICD-10-CM | POA: Diagnosis not present

## 2019-01-08 DIAGNOSIS — F329 Major depressive disorder, single episode, unspecified: Secondary | ICD-10-CM | POA: Diagnosis present

## 2019-01-08 DIAGNOSIS — Z515 Encounter for palliative care: Secondary | ICD-10-CM | POA: Diagnosis not present

## 2019-01-08 HISTORY — DX: Major depressive disorder, single episode, unspecified: F32.9

## 2019-01-08 HISTORY — DX: Gout, unspecified: M10.9

## 2019-01-08 HISTORY — DX: Dependence on other enabling machines and devices: Z99.89

## 2019-01-08 HISTORY — DX: Depression, unspecified: F32.A

## 2019-01-08 HISTORY — DX: Obstructive sleep apnea (adult) (pediatric): G47.33

## 2019-01-08 LAB — TROPONIN I
Troponin I: <0.03 ng/mL (ref <0.03)
Troponin I: <0.03 ng/mL (ref <0.03)
Troponin I: <0.03 ng/mL (ref <0.03)
Troponin I: <0.03 ng/mL (ref <0.03)

## 2019-01-08 LAB — CBC
HCT: 28 % — ABNORMAL LOW (ref 39.0–52.0)
Hemoglobin: 8.7 g/dL — ABNORMAL LOW (ref 13.0–17.0)
MCH: 29.8 pg (ref 26.0–34.0)
MCHC: 31.1 g/dL (ref 30.0–36.0)
MCV: 95.9 fL (ref 80.0–100.0)
Platelets: 256 10*3/uL (ref 150–400)
RBC: 2.92 MIL/uL — ABNORMAL LOW (ref 4.22–5.81)
RDW: 15.5 % (ref 11.5–15.5)
WBC: 6 10*3/uL (ref 4.0–10.5)
nRBC: 0 % (ref 0.0–0.2)

## 2019-01-08 LAB — GLUCOSE, CAPILLARY
Glucose-Capillary: 92 mg/dL (ref 70–99)
Glucose-Capillary: 100 mg/dL — ABNORMAL HIGH (ref 70–99)
Glucose-Capillary: 104 mg/dL — ABNORMAL HIGH (ref 70–99)
Glucose-Capillary: 160 mg/dL — ABNORMAL HIGH (ref 70–99)

## 2019-01-08 LAB — COMPREHENSIVE METABOLIC PANEL
ALK PHOS: 68 U/L (ref 38–126)
ALT: 18 U/L (ref 0–44)
ANION GAP: 7 (ref 5–15)
AST: 21 U/L (ref 15–41)
Albumin: 3.6 g/dL (ref 3.5–5.0)
BUN: 14 mg/dL (ref 8–23)
CO2: 28 mmol/L (ref 22–32)
Calcium: 8.4 mg/dL — ABNORMAL LOW (ref 8.9–10.3)
Chloride: 105 mmol/L (ref 98–111)
Creatinine, Ser: 1.1 mg/dL (ref 0.61–1.24)
GFR calc Af Amer: >60 mL/min (ref >60)
GFR calc non Af Amer: >60 mL/min (ref >60)
GLUCOSE: 110 mg/dL — AB (ref 70–99)
Potassium: 3.7 mmol/L (ref 3.5–5.1)
Sodium: 140 mmol/L (ref 135–145)
Total Bilirubin: 0.8 mg/dL (ref 0.3–1.2)
Total Protein: 6.6 g/dL (ref 6.5–8.1)

## 2019-01-08 LAB — CREATININE, SERUM
Creatinine, Ser: 1.12 mg/dL (ref 0.61–1.24)
GFR calc non Af Amer: >60 mL/min (ref >60)

## 2019-01-08 LAB — MAGNESIUM: MAGNESIUM: 2.2 mg/dL (ref 1.7–2.4)

## 2019-01-08 LAB — BRAIN NATRIURETIC PEPTIDE: B Natriuretic Peptide: 460 pg/mL — ABNORMAL HIGH (ref 0.0–100.0)

## 2019-01-08 MED ORDER — FLUOXETINE HCL 20 MG PO CAPS
20.0000 mg | ORAL_CAPSULE | Freq: Every day | ORAL | Status: DC
  Administered 2019-01-08 – 2019-01-10 (×3): 20 mg via ORAL
  Filled 2019-01-08 (×3): qty 1

## 2019-01-08 MED ORDER — TIOTROPIUM BROMIDE MONOHYDRATE 18 MCG IN CAPS
18.0000 ug | ORAL_CAPSULE | Freq: Every day | RESPIRATORY_TRACT | Status: DC
  Administered 2019-01-08 – 2019-01-10 (×3): 18 ug via RESPIRATORY_TRACT
  Filled 2019-01-08: qty 5

## 2019-01-08 MED ORDER — ONDANSETRON HCL 4 MG PO TABS: 4.0000 mg | ORAL_TABLET | Freq: Four times a day (QID) | ORAL | Status: DC | PRN

## 2019-01-08 MED ORDER — ALUM & MAG HYDROXIDE-SIMETH 200-200-20 MG/5ML PO SUSP
30.0000 mL | Freq: Once | ORAL | Status: AC
  Administered 2019-01-08: 30 mL via ORAL
  Filled 2019-01-08: qty 30

## 2019-01-08 MED ORDER — MAGNESIUM OXIDE 400 (241.3 MG) MG PO TABS
400.0000 mg | ORAL_TABLET | Freq: Two times a day (BID) | ORAL | Status: DC
  Administered 2019-01-08 – 2019-01-10 (×5): 400 mg via ORAL
  Filled 2019-01-08 (×5): qty 1

## 2019-01-08 MED ORDER — CLOPIDOGREL BISULFATE 75 MG PO TABS
75.0000 mg | ORAL_TABLET | Freq: Every day | ORAL | Status: DC
  Administered 2019-01-08 – 2019-01-10 (×3): 75 mg via ORAL
  Filled 2019-01-08 (×3): qty 1

## 2019-01-08 MED ORDER — CALCIUM CARBONATE ANTACID 500 MG PO CHEW
1.0000 | CHEWABLE_TABLET | ORAL | Status: DC | PRN
  Administered 2019-01-08: 200 mg via ORAL
  Filled 2019-01-08 (×2): qty 1

## 2019-01-08 MED ORDER — ALUM & MAG HYDROXIDE-SIMETH 200-200-20 MG/5ML PO SUSP
30.0000 mL | ORAL | Status: DC | PRN
  Administered 2019-01-08: 30 mL via ORAL
  Filled 2019-01-08: qty 30

## 2019-01-08 MED ORDER — GABAPENTIN 300 MG PO CAPS
300.0000 mg | ORAL_CAPSULE | Freq: Two times a day (BID) | ORAL | Status: DC
  Administered 2019-01-08 – 2019-01-10 (×5): 300 mg via ORAL
  Filled 2019-01-08 (×5): qty 1

## 2019-01-08 MED ORDER — NITROGLYCERIN 0.4 MG SL SUBL: 0.4000 mg | SUBLINGUAL_TABLET | SUBLINGUAL | Status: DC | PRN

## 2019-01-08 MED ORDER — MAGNESIUM SULFATE IN D5W 1-5 GM/100ML-% IV SOLN
1.0000 g | Freq: Once | INTRAVENOUS | Status: AC
  Administered 2019-01-08: 1 g via INTRAVENOUS
  Filled 2019-01-08: qty 100

## 2019-01-08 MED ORDER — TAMSULOSIN HCL 0.4 MG PO CAPS
0.4000 mg | ORAL_CAPSULE | Freq: Every day | ORAL | Status: DC
  Administered 2019-01-08 – 2019-01-09 (×2): 0.4 mg via ORAL
  Filled 2019-01-08 (×3): qty 1

## 2019-01-08 MED ORDER — CARVEDILOL 3.125 MG PO TABS
3.1250 mg | ORAL_TABLET | Freq: Two times a day (BID) | ORAL | Status: DC
  Administered 2019-01-08 – 2019-01-10 (×4): 3.125 mg via ORAL
  Filled 2019-01-08 (×6): qty 1

## 2019-01-08 MED ORDER — ONDANSETRON HCL 4 MG/2ML IJ SOLN: 4.0000 mg | Freq: Four times a day (QID) | INTRAMUSCULAR | Status: DC | PRN

## 2019-01-08 MED ORDER — FERROUS SULFATE 325 (65 FE) MG PO TABS
325.0000 mg | ORAL_TABLET | Freq: Two times a day (BID) | ORAL | Status: DC
  Administered 2019-01-08 – 2019-01-10 (×4): 325 mg via ORAL
  Filled 2019-01-08 (×8): qty 1

## 2019-01-08 MED ORDER — LIDOCAINE VISCOUS HCL 2 % MT SOLN
15.0000 mL | Freq: Once | OROMUCOSAL | Status: AC
  Administered 2019-01-08: 15 mL via ORAL
  Filled 2019-01-08: qty 15

## 2019-01-08 MED ORDER — ALLOPURINOL 100 MG PO TABS
100.0000 mg | ORAL_TABLET | Freq: Every day | ORAL | Status: DC
  Administered 2019-01-08 – 2019-01-10 (×3): 100 mg via ORAL
  Filled 2019-01-08 (×3): qty 1

## 2019-01-08 MED ORDER — ASPIRIN 81 MG PO CHEW
324.0000 mg | CHEWABLE_TABLET | Freq: Once | ORAL | Status: AC
  Administered 2019-01-08: 324 mg via ORAL

## 2019-01-08 MED ORDER — HEPARIN SODIUM (PORCINE) 5000 UNIT/ML IJ SOLN
5000.0000 [IU] | Freq: Two times a day (BID) | INTRAMUSCULAR | Status: DC
  Administered 2019-01-08 – 2019-01-10 (×4): 5000 [IU] via SUBCUTANEOUS
  Filled 2019-01-08 (×5): qty 1

## 2019-01-08 MED ORDER — LORATADINE 10 MG PO TABS
10.0000 mg | ORAL_TABLET | Freq: Every day | ORAL | Status: DC
  Administered 2019-01-08 – 2019-01-10 (×3): 10 mg via ORAL
  Filled 2019-01-08 (×3): qty 1

## 2019-01-08 MED ORDER — INSULIN ASPART 100 UNIT/ML ~~LOC~~ SOLN: 0.0000 [IU] | Freq: Every day | SUBCUTANEOUS | Status: DC

## 2019-01-08 MED ORDER — FUROSEMIDE 10 MG/ML IJ SOLN
40.0000 mg | Freq: Two times a day (BID) | INTRAMUSCULAR | Status: DC
  Administered 2019-01-08 – 2019-01-10 (×5): 40 mg via INTRAVENOUS
  Filled 2019-01-08 (×5): qty 4

## 2019-01-08 MED ORDER — MOMETASONE FURO-FORMOTEROL FUM 100-5 MCG/ACT IN AERO
2.0000 | INHALATION_SPRAY | Freq: Two times a day (BID) | RESPIRATORY_TRACT | Status: DC
  Administered 2019-01-08 – 2019-01-10 (×5): 2 via RESPIRATORY_TRACT
  Filled 2019-01-08: qty 8.8

## 2019-01-08 MED ORDER — RANOLAZINE ER 500 MG PO TB12
500.0000 mg | ORAL_TABLET | Freq: Two times a day (BID) | ORAL | Status: DC
  Administered 2019-01-08 – 2019-01-10 (×4): 500 mg via ORAL
  Filled 2019-01-08 (×6): qty 1

## 2019-01-08 MED ORDER — PANTOPRAZOLE SODIUM 40 MG PO TBEC
40.0000 mg | DELAYED_RELEASE_TABLET | Freq: Every day | ORAL | Status: DC
  Administered 2019-01-08 – 2019-01-10 (×3): 40 mg via ORAL
  Filled 2019-01-08 (×3): qty 1

## 2019-01-08 MED ORDER — SODIUM CHLORIDE 0.9% FLUSH
3.0000 mL | Freq: Two times a day (BID) | INTRAVENOUS | Status: DC
  Administered 2019-01-08 – 2019-01-10 (×4): 3 mL via INTRAVENOUS

## 2019-01-08 MED ORDER — FUROSEMIDE 10 MG/ML IJ SOLN
40.0000 mg | Freq: Once | INTRAMUSCULAR | Status: AC
  Administered 2019-01-08: 40 mg via INTRAVENOUS
  Filled 2019-01-08: qty 4

## 2019-01-08 MED ORDER — COENZYME Q10 30 MG PO CAPS: 30.0000 mg | ORAL_CAPSULE | Freq: Every day | ORAL | Status: DC

## 2019-01-08 MED ORDER — TRAZODONE HCL 50 MG PO TABS: 25.0000 mg | ORAL_TABLET | Freq: Every evening | ORAL | Status: DC | PRN

## 2019-01-08 MED ORDER — ASPIRIN 81 MG PO CHEW
81.0000 mg | CHEWABLE_TABLET | Freq: Every day | ORAL | Status: DC
  Administered 2019-01-08 – 2019-01-10 (×3): 81 mg via ORAL
  Filled 2019-01-08 (×3): qty 1

## 2019-01-08 MED ORDER — ATORVASTATIN CALCIUM 20 MG PO TABS
80.0000 mg | ORAL_TABLET | Freq: Every evening | ORAL | Status: DC
  Administered 2019-01-08 – 2019-01-09 (×2): 80 mg via ORAL
  Filled 2019-01-08 (×3): qty 4

## 2019-01-08 MED ORDER — ALBUTEROL SULFATE (2.5 MG/3ML) 0.083% IN NEBU
3.0000 mL | INHALATION_SOLUTION | Freq: Four times a day (QID) | RESPIRATORY_TRACT | Status: DC | PRN
  Administered 2019-01-08: 3 mL via RESPIRATORY_TRACT
  Filled 2019-01-08: qty 3

## 2019-01-08 MED ORDER — ASPIRIN 81 MG PO CHEW
CHEWABLE_TABLET | ORAL | Status: AC
  Filled 2019-01-08: qty 4

## 2019-01-08 MED ORDER — ACETAMINOPHEN 500 MG PO TABS: 1000.0000 mg | ORAL_TABLET | Freq: Every day | ORAL | Status: DC | PRN

## 2019-01-08 MED ORDER — INSULIN ASPART 100 UNIT/ML ~~LOC~~ SOLN
0.0000 [IU] | Freq: Three times a day (TID) | SUBCUTANEOUS | Status: DC
  Administered 2019-01-09: 2 [IU] via SUBCUTANEOUS
  Administered 2019-01-09 – 2019-01-10 (×2): 3 [IU] via SUBCUTANEOUS
  Filled 2019-01-08 (×3): qty 1

## 2019-01-08 MED ORDER — POTASSIUM CHLORIDE 20 MEQ PO PACK
40.0000 meq | PACK | Freq: Once | ORAL | Status: AC
  Administered 2019-01-08: 40 meq via ORAL
  Filled 2019-01-08: qty 2

## 2019-01-08 MED ORDER — GUAIFENESIN ER 600 MG PO TB12
600.0000 mg | ORAL_TABLET | Freq: Two times a day (BID) | ORAL | Status: DC | PRN
  Filled 2019-01-08: qty 1

## 2019-01-08 NOTE — ED Notes (Signed)
Patient c/o generalized chest pain intermittently throughout the day described as pressure. Patient denies radiation. Patient currently rates pain at 1 of 10. Patient reports accompanying symptoms of SOB, dizziness, light-headedness, nausea, weakness. Patient reports hx of former smoker (stopped in '97), DM, HTN, hyperlipidemia. Patient has a ventricular-paced pacemaker.

## 2019-01-08 NOTE — ED Notes (Addendum)
Paged admitting MD about pt HR jumping up to 140 and pt throwing short runs of V-tach (most was 8 beat). Dr. Manuella Ghazi stated that he would consult cardiology to come to ED to assess pt.

## 2019-01-08 NOTE — ED Provider Notes (Signed)
[facility] Emergency Department Provider Note ______________________   First MD Initiated Contact with Patient 01/08/19 [phone]     (approximate)  I have reviewed the triage vital signs and the nursing notes.   HISTORY  Chief Complaint Chest Pain    HPI [name] is a 73 y.o. male with below list of chronic medical conditions including COPD CHF recent angioplasty and stent placement September 2019 presents to the emergency department with central nonradiating chest pain with associated dyspnea which has been occurring intermittently all day.  Patient states that the dyspnea is persistent however the chest pain is indeed intermittent.  Patient denies any fever no cough.  Patient states symptoms consistent with previous episodes of pulmonary edema.  During evaluation patient states pain score is 4 out of 10.   Past Medical History:  Diagnosis Date  . Anemia   . Cancer ([place]) 12/2013   prostate  . Cardiogenic pulmonary edema ([place]) 12/19/2014  . Cardiomyopathy, ischemic   . CHF (congestive heart failure) ([place])   . COPD (chronic obstructive pulmonary disease) ([place])   . Diabetes mellitus without complication ([place])   . GERD (gastroesophageal reflux disease)   . Hypercholesteremia   . Hypertension   . Myocardial infarction ([place]) [id number]  . Shortness of breath dyspnea     Patient Active Problem List   Diagnosis Date Noted  . Respiratory failure ([place]) 12/09/2018  . 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 Unstable angina ([place])   . COPD exacerbation ([place]) 06/06/2018  . Acute on chronic systolic CHF (congestive heart failure) ([place]) 04/21/2017  . Hypokalemia 04/21/2017  . Hypomagnesemia 04/21/2017  . Essential hypertension 04/21/2017  . Hyperlipidemia 04/21/2017  . Coronary artery disease 04/21/2017  . COPD with chronic bronchitis ([place]) 02/13/2016  . Obstructive sleep apnea 02/13/2016  . Diabetes ([place]) 02/13/2016  . Chronic systolic HF  (heart failure) ([place]) 12/12/2015    Past Surgical History:  Procedure Laterality Date  . CARDIAC CATHETERIZATION N/A 02/02/2016   Procedure: Left Heart Cath and Coronary Angiography;  Surgeon: [name], MD;  Location: [place] [facility];  Service: Cardiovascular;  Laterality: N/A;  . CORONARY ANGIOPLASTY WITH STENT PLACEMENT    . CORONARY ARTERY BYPASS GRAFT  11/24/2010  . CORONARY STENT INTERVENTION N/A 09/04/2018   Procedure: CORONARY STENT INTERVENTION;  Surgeon: [name], MD;  Location: [place] [facility];  Service: Cardiovascular;  Laterality: N/A;  . IMPLANTABLE CARDIOVERTER DEFIBRILLATOR (ICD) GENERATOR CHANGE Left 12/12/2015   Procedure: DUAL LEAD PLACEMENT CARDIAC DIFIBRILLATOR;  Surgeon: [name], MD;  Location: [facility];  Service: Cardiovascular;  Laterality: Left;  . LEFT HEART CATH AND CORS/GRAFTS ANGIOGRAPHY N/A 09/04/2018   Procedure: LEFT HEART CATH AND CORS/GRAFTS ANGIOGRAPHY;  Surgeon: [name], MD;  Location: [place] [facility];  Service: Cardiovascular;  Laterality: N/A;  . TONSILLECTOMY      Prior to Admission medications   Medication Sig Start Date End Date Taking? Authorizing Provider  acetaminophen (TYLENOL) 500 MG tablet Take 1,000 mg by mouth daily as needed for moderate pain.    [provider]  albuterol (PROVENTIL HFA;VENTOLIN HFA) 108 (90 Base) MCG/ACT inhaler Inhale 2 puffs into the lungs every 6 (six) hours as needed for wheezing or shortness of breath. 06/04/18   Rudene Re, MD  allopurinol (ZYLOPRIM) 100 MG tablet Take 100 mg by mouth daily.    [provider]  aspirin 81 MG chewable tablet Chew 1 tablet (81 mg total) by mouth  daily. 09/06/18   Mayo, Pete [name], MD  atorvastatin (LIPITOR) 80 MG tablet Take 1 tablet (80 mg total) by mouth every evening. 04/21/17   Theodoro [name], MD  budesonide-formoterol (SYMBICORT) 80-4.5 MCG/ACT inhaler Inhale 2 puffs into the lungs 2 (two) times daily.    [provider]  calcium carbonate (TUMS - DOSED IN MG ELEMENTAL CALCIUM) 500 MG chewable tablet Chew 1 tablet by mouth as needed for indigestion or heartburn.    [provider]  carvedilol (COREG) 3.125 MG tablet Take 1 tablet (3.125 mg total) by mouth 2 (two) times daily with a meal. 01/01/18   Gladstone [name], MD  clopidogrel (PLAVIX) 75 MG tablet Take 75 mg by mouth daily.    [provider]  co-enzyme Q-10 30 MG capsule Take 30 mg by mouth daily.    [provider]  ferrous sulfate 325 (65 FE) MG tablet Take 325 mg by mouth 2 (two) times daily with a meal.    [provider]  FLUoxetine (PROZAC) 20 MG capsule Take 20 mg by mouth daily.    [provider]  gabapentin (NEURONTIN) 300 MG capsule Take 300 mg by mouth 2 (two) times daily.     [provider]  guaiFENesin (MUCINEX) 600 MG 12 hr tablet Take 600 mg by mouth 2 (two) times daily as needed.     [provider]  insulin glargine (LANTUS) 100 UNIT/ML injection Inject 0.8 mLs (80 Units total) into the skin at bedtime. 12/12/18   Vaughan Basta, MD  insulin lispro (HUMALOG) 100 UNIT/ML KiwkPen Inject 0-15 Units into the skin 3 (three) times daily with meals. Per sliding scale    [provider]  lansoprazole (PREVACID) 15 MG capsule Take 30 mg by mouth 2 (two) times daily.    [provider]  loratadine (CLARITIN) 10 MG tablet Take 10 mg by mouth daily.    [provider]  Magnesium Oxide 400 (240 Mg) MG TABS Take 1 tablet (400 mg total) by mouth 2 (two) times daily. 05/30/18   Alisa Graff, FNP  Multiple Vitamins-Minerals (CENTRUM SILVER PO) Take 1 tablet by mouth every morning.    [provider]  Multiple Vitamins-Minerals (PRESERVISION AREDS 2) CAPS Take 1 capsule by mouth 2 (two) times daily. For macular degeneration- eye vitamins    [provider]  nitroGLYCERIN (NITROSTAT) 0.4 MG SL tablet Place 0.4 mg under the tongue  every 5 (five) minutes as needed for chest pain.    [provider]  ranolazine (RANEXA) 500 MG 12 hr tablet Take 1 tablet (500 mg total) by mouth 2 (two) times daily. 05/30/18   Alisa Graff, FNP  tamsulosin (FLOMAX) 0.4 MG CAPS capsule Take 0.4 mg by mouth at bedtime.     [provider]  tiotropium (SPIRIVA) 18 MCG inhalation capsule Place 1 capsule (18 mcg total) into inhaler and inhale daily. 06/08/18   Loletha Grayer, MD    Allergies Brilinta [ticagrelor]; Tuberculin tests; Benadryl [diphenhydramine]; Doxycycline; and Lopid [gemfibrozil]  Family History  Problem Relation Age of Onset  . CAD Mother   . Cancer Mother   . Diabetes Mother   . Alzheimer's disease Father   . Cancer Father   . Heart disease Father     Social History Social History   Tobacco Use  . Smoking status: Former Smoker    Packs/day: 2.00    Years: 30.00    Pack years: 60.00    Last attempt to quit: 12/03/1996  Years since quitting: 22.1  . Smokeless tobacco: Never Used  Substance Use Topics  . Alcohol use: No    Alcohol/week: 0.0 standard drinks  . Drug use: No    Review of Systems Constitutional: No fever/chills Eyes: No visual changes. ENT: No sore throat. Cardiovascular: Positive for chest pain. Respiratory: Positive for shortness of breath. Gastrointestinal: No abdominal pain.  No nausea, no vomiting.  No diarrhea.  No constipation. Genitourinary: Negative for dysuria. Musculoskeletal: Negative for neck pain.  Negative for back pain. Integumentary: Negative for rash. Neurological: Negative for headaches, focal weakness or numbness.  ____________________________________________   PHYSICAL EXAM:  VITAL SIGNS: ED Triage Vitals  Enc Vitals Group     BP 01/07/19 1841 (!) 121/52     Pulse Rate 01/07/19 1841 77     Resp 01/07/19 1841 20     Temp 01/07/19 1841 98.1 F (36.7 C)     Temp Source 01/07/19 1841 Oral     SpO2 01/07/19 1841 95 %     Weight 01/07/19 1838  107.4 kg (236 lb 12.8 oz)     Height 01/07/19 1838 1.702 m (5\' 7" )     Head Circumference --      Peak Flow --      Pain Score 01/07/19 1838 2     Pain Loc --      Pain Edu? --      Excl. in [place]? --     Constitutional: Alert and oriented. Well appearing and in no acute distress. Eyes: Conjunctivae are normal.  Mouth/Throat: Mucous membranes are moist.  Oropharynx non-erythematous. Neck: No stridor.  Cardiovascular: Normal rate, regular rhythm. Good peripheral circulation. Grossly normal heart sounds. Respiratory: Normal respiratory effort.  No retractions. Lungs CTAB. Gastrointestinal: Soft and nontender. No distention.  Musculoskeletal: 1+ bilateral lower extremity pitting edema Neurologic:  Normal speech and language. No gross focal neurologic deficits are appreciated.  Skin:  Skin is warm, dry and intact. No rash noted. Psychiatric: Mood and affect are normal. Speech and behavior are normal.  ____________________________________________   LABS (all labs ordered are listed, but only abnormal results are displayed)  Labs Reviewed  BASIC METABOLIC PANEL - Abnormal; Notable for the following components:      Result Value   Glucose, Bld 126 (*)    Calcium 8.5 (*)    All other components within normal limits  CBC - Abnormal; Notable for the following components:   RBC 3.04 (*)    Hemoglobin 9.2 (*)    HCT 29.0 (*)    All other components within normal limits  TROPONIN I  PROTIME-INR  BRAIN NATRIURETIC PEPTIDE  TROPONIN I   ____________________________________________  EKG  ED ECG REPORT I, [place] N Rhaya Coale, the attending physician, personally viewed and interpreted this ECG.   Date: 01/08/2019  EKG Time: 1:59 AM  Rate: 88  Rhythm: Ventricular paced   Axis: Normal  Intervals: Normal  ST&T Change: None  ED ECG REPORT I, [place] N Mychal Durio, the attending physician, personally viewed and interpreted this ECG.   Date: 01/08/2019  EKG Time: 6:39 PM  Rate: 85   Rhythm: Atrial sensed and ventricular paced rhythm  Axis: Normal  Intervals: Normal  ST&T Change: None  ____________________________________________  RADIOLOGY I, [place] N [place], personally viewed and evaluated these images (plain radiographs) as part of my medical decision making, as well as reviewing the written report by the radiologist.  ED MD interpretation: Mild interstitial edema on chest x-ray per radiologist  Official radiology report(s): Dg  Chest 2 View  Result Date: 01/07/2019 CLINICAL DATA:  Acute chest pain and shortness of breath. EXAM: CHEST - 2 VIEW COMPARISON:  12/28/2018 and prior chest radiographs FINDINGS: Cardiomegaly, CABG and LEFT pacemaker/AICD again noted. Interstitial opacities and very small bilateral pleural effusions again noted. No pneumothorax. No acute bony abnormalities identified. IMPRESSION: Mild interstitial opacities which may represent interstitial edema. Very small bilateral pleural effusions. Cardiomegaly. Electronically Signed   By: Margarette Canada M.D.   On: 01/07/2019 19:12     Procedures   ____________________________________________   INITIAL IMPRESSION / ASSESSMENT AND PLAN / ED COURSE  As part of my medical decision making, I reviewed the following data within the electronic MEDICAL RECORD NUMBER   73 year old male presented with above-stated history and physical exam with differential diagnosis including MI angina CHF exacerbation COPD exacerbation (less likely).  Concern for possible CHF exacerbation and as such patient given Lasix 40 mg IV.  While in my care the patient experienced another episode of chest pain which I promptly entered to the room patient in apparent discomfort.  EKG revealed no acute changes.  Patient given aspirin 324 mg.  Patient discussed with Dr. Posey Pronto for hospital admission for further evaluation and management ____________________________________________  FINAL CLINICAL IMPRESSION(S) / ED DIAGNOSES  Final diagnoses:    Acute on chronic congestive heart failure, unspecified heart failure type ([place])     MEDICATIONS GIVEN DURING THIS VISIT:  Medications  furosemide (LASIX) injection 40 mg (40 mg Intravenous Given 01/08/19 0147)     ED Discharge Orders    None       Note:  This document was prepared using Dragon voice recognition software and may include unintentional dictation errors.    Gregor Hams, MD 01/08/19 [phone]

## 2019-01-08 NOTE — Progress Notes (Signed)
PHARMACIST - PHYSICIAN ORDER COMMUNICATION  CONCERNING: P&T Medication Policy on Herbal Medications  DESCRIPTION:  This patient's order for:  co-enzyme Q-10 capsule 30 mg  has been noted.  This product(s) is classified as an "herbal" or natural product. Due to a lack of definitive safety studies or FDA approval, nonstandard manufacturing practices, plus the potential risk of unknown drug-drug interactions while on inpatient medications, the Pharmacy and Therapeutics Committee does not permit the use of "herbal" or natural products of this type within [facility]   ACTION TAKEN: The pharmacy department is unable to verify this order at this time.  Please reevaluate patient's clinical condition at discharge and address if the herbal or natural product(s) should be resumed at that time.  Pernell Dupre, PharmD, BCPS Clinical Pharmacist 01/08/2019 4:19 AM

## 2019-01-08 NOTE — ED Notes (Signed)
ED TO INPATIENT HANDOFF REPORT  Name/Age/Gender [name] 73 y.o. male  Code Status    Code Status Orders  (From admission, onward)         Start     Ordered   01/08/19 0417  Full code  Continuous     01/08/19 0416        Code Status History    Date Active Date Inactive Code Status Order ID Comments User Context   12/10/2018 0047 12/12/2018 2127 Full Code [id number]  Gorden Harms, MD Inpatient   09/03/2018 0037 09/05/2018 1912 Full Code [id number]  Amelia Jo, MD Inpatient   06/06/2018 1529 06/08/2018 1410 Full Code [id number]  Epifanio Lesches, MD ED   02/18/2018 1248 02/19/2018 1854 Full Code [id number]  Idelle Crouch, MD Inpatient   12/30/2017 0305 01/01/2018 1609 Full Code [id number]  Harrie Foreman, MD ED   12/05/2017 1105 12/06/2017 1659 Full Code [id number]  Harrie Foreman, MD ED   07/17/2017 0439 07/18/2017 1759 Full Code [id number]  Harrie Foreman, MD Inpatient   07/01/2017 0119 07/02/2017 1546 Full Code [id number]  Lance Coon, MD Inpatient   04/20/2017 0232 04/21/2017 1816 Full Code [id number]  Saundra Shelling, MD Inpatient   01/31/2016 0143 02/02/2016 2111 Full Code [id number]  Hillary Bow, MD ED   12/12/2015 1627 12/13/2015 1529 Full Code [id number]  [name], MD Inpatient    Advance Directive Documentation     Most Recent Value  Type of Advance Directive  Living will  Pre-existing out of facility DNR order (yellow form or pink MOST form)  -  "MOST" Form in Place?  -      Home/SNF/Other Home  Chief Complaint chest pain   Level of Care/Admitting Diagnosis ED Disposition    ED Disposition Condition [place]: [place] [[id number]]  Level of Care: Telemetry [5]  Diagnosis: CHF (congestive heart failure) [facility]) [[id number]]  Admitting Physician: [place] Muta [[id number]]  Attending Physician: [place] Muta [[id number]]  Estimated length of stay: 3 - 4 days  Certification:: I certify this patient will need  inpatient services for at least 2 midnights  PT Class (Do Not Modify): Inpatient [101]  PT Acc Code (Do Not Modify): Private [1]       Medical History Past Medical History:  Diagnosis Date  . Anemia   . Cancer ([place]) 12/2013   prostate  . Cardiogenic pulmonary edema ([place]) 12/19/2014  . Cardiomyopathy, ischemic   . CHF (congestive heart failure) ([place])   . COPD (chronic obstructive pulmonary disease) ([place])   . Diabetes mellitus without complication ([place])   . GERD (gastroesophageal reflux disease)   . Hypercholesteremia   . Hypertension   . Myocardial infarction ([place]) [id number]  . Shortness of breath dyspnea     Allergies Allergies  Allergen Reactions  . Brilinta [Ticagrelor] Shortness Of Breath  . Tuberculin Tests Rash  . Benadryl [Diphenhydramine] Other (See Comments)    " Hyperactivity"  . Doxycycline Swelling    Pt went into pulmonary edema.  . Lopid [Gemfibrozil] Swelling    "I gain 1 pound a day for 30 days."    IV Location/Drains/Wounds Patient Lines/Drains/Airways Status   Active Line/Drains/Airways    Name:   Placement date:   Placement time:   Site:   Days:   Peripheral IV 01/08/19 Right Antecubital   01/08/19    0129    Antecubital   less than 1   Wound / Incision (Open or  Dehisced) 12/10/18 Laceration Arm Left;Lower;Posterior appr. 2 inches X 2 inches    12/10/18    0058    Arm   29          Labs/Imaging Results for orders placed or performed during the hospital encounter of 01/08/19 (from the past 48 hour(s))  Basic metabolic panel     Status: Abnormal   Collection Time: 01/07/19  6:45 PM  Result Value Ref Range   Sodium 141 135 - 145 mmol/L   Potassium 3.7 3.5 - 5.1 mmol/L   Chloride 106 98 - 111 mmol/L   CO2 27 22 - 32 mmol/L   Glucose, Bld 126 (H) 70 - 99 mg/dL   BUN 15 8 - 23 mg/dL   Creatinine, Ser 1.12 0.61 - 1.24 mg/dL   Calcium 8.5 (L) 8.9 - 10.3 mg/dL   GFR calc non Af Amer >60 >60 mL/min   GFR calc Af Amer >60 >60 mL/min   Anion  gap 8 5 - 15    Comment: Performed at [facility], [address] [place], [place] [address]  CBC     Status: Abnormal   Collection Time: 01/07/19  6:45 PM  Result Value Ref Range   WBC 5.7 4.0 - 10.5 K/uL   RBC 3.04 (L) 4.22 - 5.81 MIL/uL   Hemoglobin 9.2 (L) 13.0 - 17.0 g/dL   HCT 29.0 (L) 39.0 - 52.0 %   MCV 95.4 80.0 - 100.0 fL   MCH 30.3 26.0 - 34.0 pg   MCHC 31.7 30.0 - 36.0 g/dL   RDW 15.5 11.5 - 15.5 %   Platelets 255 150 - 400 K/uL   nRBC 0.0 0.0 - 0.2 %    Comment: Performed at [facility], [address] [place], [place] [address]  Troponin I - ONCE - STAT     Status: None   Collection Time: 01/07/19  6:45 PM  Result Value Ref Range   Troponin I <0.03 <0.03 ng/mL    Comment: Performed at [facility], [address] [place], [place] [address]  Protime-INR (order if Patient is taking Coumadin / Warfarin)     Status: None   Collection Time: 01/07/19  6:45 PM  Result Value Ref Range   Prothrombin Time 14.0 11.4 - 15.2 seconds   INR 1.09     Comment: Performed at [facility], [address], [place], [place] [address]  Brain natriuretic peptide     Status: Abnormal   Collection Time: 01/07/19  6:45 PM  Result Value Ref Range   B Natriuretic Peptide 460.0 (H) 0.0 - 100.0 pg/mL    Comment: Performed at [facility], [address] [place], [place] [address]  Troponin I - ONCE - STAT     Status: None   Collection Time: 01/08/19  1:29 AM  Result Value Ref Range   Troponin I <0.03 <0.03 ng/mL    Comment: Performed at [facility], [address] [place], [place] [address]  Magnesium     Status: None   Collection Time: 01/08/19  4:59 AM  Result Value Ref Range   Magnesium 2.2 1.7 - 2.4 mg/dL    Comment: Performed at [facility], [address] [place],  [address]  Creatinine, serum     Status: None   Collection Time: 01/08/19  4:59 AM  Result Value Ref Range   Creatinine, Ser 1.12 0.61  - 1.24 mg/dL   GFR calc non Af Amer >60 >  60 mL/min   GFR calc Af Amer >60 >60 mL/min    Comment: Performed at [facility], [address] [place], [place] [address]  CBC     Status: Abnormal   Collection Time: 01/08/19  4:59 AM  Result Value Ref Range   WBC 6.0 4.0 - 10.5 K/uL   RBC 2.92 (L) 4.22 - 5.81 MIL/uL   Hemoglobin 8.7 (L) 13.0 - 17.0 g/dL   HCT 28.0 (L) 39.0 - 52.0 %   MCV 95.9 80.0 - 100.0 fL   MCH 29.8 26.0 - 34.0 pg   MCHC 31.1 30.0 - 36.0 g/dL   RDW 15.5 11.5 - 15.5 %   Platelets 256 150 - 400 K/uL   nRBC 0.0 0.0 - 0.2 %    Comment: Performed at [facility], [address] [place], [place] [address]  Troponin I - Now Then Q6H     Status: None   Collection Time: 01/08/19  4:59 AM  Result Value Ref Range   Troponin I <0.03 <0.03 ng/mL    Comment: Performed at [facility], [address] [place], [place] [address]  Comprehensive metabolic panel     Status: Abnormal   Collection Time: 01/08/19  4:59 AM  Result Value Ref Range   Sodium 140 135 - 145 mmol/L   Potassium 3.7 3.5 - 5.1 mmol/L   Chloride 105 98 - 111 mmol/L   CO2 28 22 - 32 mmol/L   Glucose, Bld 110 (H) 70 - 99 mg/dL   BUN 14 8 - 23 mg/dL   Creatinine, Ser 1.10 0.61 - 1.24 mg/dL   Calcium 8.4 (L) 8.9 - 10.3 mg/dL   Total Protein 6.6 6.5 - 8.1 g/dL   Albumin 3.6 3.5 - 5.0 g/dL   AST 21 15 - 41 U/L   ALT 18 0 - 44 U/L   Alkaline Phosphatase 68 38 - 126 U/L   Total Bilirubin 0.8 0.3 - 1.2 mg/dL   GFR calc non Af Amer >60 >60 mL/min   GFR calc Af Amer >60 >60 mL/min   Anion gap 7 5 - 15    Comment: Performed at [facility], [address] [place], [place] [address]  Glucose, capillary     Status: None   Collection Time: 01/08/19  7:47 AM  Result Value Ref Range   Glucose-Capillary 92 70 - 99 mg/dL  Troponin I - Now Then Q6H     Status: None   Collection Time: 01/08/19 10:18 AM  Result Value Ref Range   Troponin I <0.03 <0.03 ng/mL    Comment: Performed  at [facility], [address] [place], [place] [address]  Glucose, capillary     Status: Abnormal   Collection Time: 01/08/19 11:59 AM  Result Value Ref Range   Glucose-Capillary 100 (H) 70 - 99 mg/dL  Troponin I - Now Then Q6H     Status: None   Collection Time: 01/08/19  3:50 PM  Result Value Ref Range   Troponin I <0.03 <0.03 ng/mL    Comment: Performed at [facility], [address] [place], [place] [address]  Glucose, capillary     Status: Abnormal   Collection Time: 01/08/19  5:04 PM  Result Value Ref Range   Glucose-Capillary 104 (H) 70 - 99 mg/dL   Comment 1 Notify RN    Dg Chest 2 View  Result Date: 01/07/2019 CLINICAL DATA:  Acute chest pain and shortness of breath. EXAM: CHEST -  2 VIEW COMPARISON:  12/28/2018 and prior chest radiographs FINDINGS: Cardiomegaly, CABG and LEFT pacemaker/AICD again noted. Interstitial opacities and very small bilateral pleural effusions again noted. No pneumothorax. No acute bony abnormalities identified. IMPRESSION: Mild interstitial opacities which may represent interstitial edema. Very small bilateral pleural effusions. Cardiomegaly. Electronically Signed   By: Margarette Canada M.D.   On: 01/07/2019 19:12   EKG Interpretation  Date/Time:  Monday January 08 2019 05:41:42 EST Ventricular Rate:  101 PR Interval:  134 QRS Duration: 170 QT Interval:  438 QTC Calculation: 593 R Axis:   25 Text Interpretation:  Atrial-sensed ventricular-paced complexes No further rhythm analysis attempted due to paced rhythm Consider right atrial enlargement Left bundle branch block ----------unconfirmed---------- Confirmed by UNCONFIRMED, DOCTOR ([id number]), editor Frederich Cha [phone]) on 01/08/2019 3:27:33 PM   Pending Labs Unresulted Labs (From admission, onward)    Start     Ordered   01/09/19 0500  CBC  Tomorrow morning,   STAT     01/08/19 1415   01/09/19 8546  Basic metabolic panel  Tomorrow morning,   STAT     01/08/19 1415           Vitals/Pain Today's Vitals   01/08/19 1413 01/08/19 1530 01/08/19 1630 01/08/19 1730  BP: (!) 108/53 103/74 (!) 127/55 (!) 127/57  Pulse: 78 65 87 80  Resp: (!) 26 (!) 24 20 (!) 25  Temp:      TempSrc:      SpO2: 96% 96% 98% 98%  Weight:      Height:      PainSc:        Isolation Precautions No active isolations  Medications Medications  acetaminophen (TYLENOL) tablet 1,000 mg (has no administration in time range)  allopurinol (ZYLOPRIM) tablet 100 mg (100 mg Oral Given 01/08/19 1014)  aspirin chewable tablet 81 mg (81 mg Oral Given 01/08/19 1014)  atorvastatin (LIPITOR) tablet 80 mg (80 mg Oral Given 01/08/19 1818)  carvedilol (COREG) tablet 3.125 mg (3.125 mg Oral Given 01/08/19 1819)  nitroGLYCERIN (NITROSTAT) SL tablet 0.4 mg (has no administration in time range)  ranolazine (RANEXA) 12 hr tablet 500 mg (500 mg Oral Given 01/08/19 1014)  FLUoxetine (PROZAC) capsule 20 mg (20 mg Oral Given 01/08/19 1014)  calcium carbonate (TUMS - dosed in mg elemental calcium) chewable tablet 200 mg of elemental calcium (200 mg of elemental calcium Oral Given 01/08/19 0745)  pantoprazole (PROTONIX) EC tablet 40 mg (40 mg Oral Given 01/08/19 1014)  tamsulosin (FLOMAX) capsule 0.4 mg (has no administration in time range)  clopidogrel (PLAVIX) tablet 75 mg (75 mg Oral Given 01/08/19 1014)  ferrous sulfate tablet 325 mg (325 mg Oral Given 01/08/19 0844)  gabapentin (NEURONTIN) capsule 300 mg (300 mg Oral Given 01/08/19 1014)  magnesium oxide (MAG-OX) tablet 400 mg (400 mg Oral Given 01/08/19 1014)  albuterol (PROVENTIL) (2.5 MG/3ML) 0.083% nebulizer solution 3 mL (3 mLs Inhalation Given 01/08/19 1557)  mometasone-formoterol (DULERA) 100-5 MCG/ACT inhaler 2 puff (2 puffs Inhalation Given 01/08/19 0846)  guaiFENesin (MUCINEX) 12 hr tablet 600 mg (has no administration in time range)  loratadine (CLARITIN) tablet 10 mg (10 mg Oral Given 01/08/19 1014)  tiotropium (SPIRIVA) inhalation capsule ([facility] use ONLY) 18 mcg (18 mcg  Inhalation Given 01/08/19 1013)  heparin injection 5,000 Units (5,000 Units Subcutaneous Given 01/08/19 1016)  traZODone (DESYREL) tablet 25 mg (has no administration in time range)  ondansetron (ZOFRAN) tablet 4 mg (has no administration in time range)    Or  ondansetron (ZOFRAN)  injection 4 mg (has no administration in time range)  furosemide (LASIX) injection 40 mg (40 mg Intravenous Given 01/08/19 0847)  sodium chloride flush (NS) 0.9 % injection 3 mL ( Intravenous Canceled Entry 01/08/19 1022)  insulin aspart (novoLOG) injection 0-15 Units (0 Units Subcutaneous Not Given 01/08/19 1803)  insulin aspart (novoLOG) injection 0-5 Units (has no administration in time range)  furosemide (LASIX) injection 40 mg (40 mg Intravenous Given 01/08/19 0147)  alum & mag hydroxide-simeth (MAALOX/MYLANTA) 200-200-20 MG/5ML suspension 30 mL (30 mLs Oral Given 01/08/19 0204)    And  lidocaine (XYLOCAINE) 2 % viscous mouth solution 15 mL (15 mLs Oral Given 01/08/19 0204)  aspirin chewable tablet 324 mg (324 mg Oral Given 01/08/19 0203)  potassium chloride (KLOR-CON) packet 40 mEq (40 mEq Oral Given 01/08/19 0456)  magnesium sulfate IVPB 1 g 100 mL (0 g Intravenous Stopped 01/08/19 0647)    Mobility walks

## 2019-01-08 NOTE — ED Notes (Signed)
Pt given breakfast tray and water 

## 2019-01-08 NOTE — ED Notes (Signed)
Patient placed on 2L [place] per MD order.

## 2019-01-08 NOTE — ED Notes (Signed)
Patient reports increased comfort of breathing. Decreased work of breathing seen on assessment.

## 2019-01-08 NOTE — ED Notes (Signed)
Patient had 3-run beat of [place]. MD Owens Shark informed and showed rhythm strip. Admitting MD paged.

## 2019-01-08 NOTE — ED Notes (Signed)
Patient's oxygen saturation decreased to 90% on 2L [place]. Patient's oxygen increased to 3L [place]. RN will continue to monitor.

## 2019-01-08 NOTE — ED Notes (Signed)
Nurse attempted to call report to floor and was told the current room assignment was not cleaned. Nurse notified Marya Amsler, RN who is charge.

## 2019-01-08 NOTE — ED Notes (Signed)
Patient had another 3-run beat of Vtach. MD Owens Shark and admitting MD informed. Admitting MD reported he would order 1 gram magnesium.

## 2019-01-08 NOTE — ED Notes (Signed)
ED Provider at bedside. 

## 2019-01-08 NOTE — ED Notes (Signed)
Pt in NAD. Nurse gave pt extra ice. Monitor fixed on pt.

## 2019-01-08 NOTE — Progress Notes (Signed)
Patient states he wears CPAP pressure 8 at home on 5L. Patient was found on Room Air SAT at 93%. Patient states he wears 5L with CPAP at home. Patient was previously on 2L [place] SAT at 96%. Patient continued on 2L with CPAP. SAT at 96%. Patient instructed to call RN/RT if he needs to come off CPAP throughout the night to place [place] back on. Patient resting comfortably in bed. No distress.

## 2019-01-08 NOTE — ED Notes (Addendum)
Informed admitting MD of patient's run of Vtach - Admitting MD reported he would out in new orders.

## 2019-01-08 NOTE — ED Notes (Signed)
Pt's meal arrived. Pt in NAD.

## 2019-01-08 NOTE — H&P (Signed)
[facility] [place] NAME: [name]    MR#:  [id number]  DATE OF BIRTH:  December 22, 1945  DATE OF ADMISSION:  01/08/2019  PRIMARY CARE PHYSICIAN: [name], MD   REQUESTING/REFERRING PHYSICIAN: Dr. Owens Shark  CHIEF COMPLAINT:   Chief Complaint  Patient presents with  . Chest Pain    HISTORY OF PRESENT ILLNESS:  [name]  is a 73 y.o. male with a known history listed below presented to emergency room for evaluation of shortness of breath and chest pain.  Patient has shortness of breath particularly during lying flat.  Today he could not lie flat and started having chest pain and tightness.  He feels better when he sits up during sleeping position.  Patient denies fever or chills.  No cough or sputum production.  Patient denies any weight gain or leg swelling.  Patient has positive orthopnea and paroxysmal nocturnal dyspnea.  No other complaints.  Patient was concerned as he has history of 3 heart attacks in the past.  In emergency room patient had initial evaluation with EKG chest x-ray and labs.  Patient has negative troponin and EKG without any acute changes.  Chest x-ray with mild interstitial congestion.  Patient received aspirin and Lasix in the emergency room.  Hospitalist team requested for admission.  PAST MEDICAL HISTORY:   Past Medical History:  Diagnosis Date  . Anemia   . Cancer ([place]) 12/2013   prostate  . Cardiogenic pulmonary edema ([place]) 12/19/2014  . Cardiomyopathy, ischemic   . CHF (congestive heart failure) ([place])   . COPD (chronic obstructive pulmonary disease) ([place])   . Diabetes mellitus without complication ([place])   . GERD (gastroesophageal reflux disease)   . Hypercholesteremia   . Hypertension   . Myocardial infarction ([place]) [id number]  . Shortness of breath dyspnea     PAST SURGICAL HISTORY:   Past Surgical History:  Procedure Laterality Date  . CARDIAC CATHETERIZATION N/A 02/02/2016   Procedure: Left Heart Cath and Coronary Angiography;  Surgeon: [name], MD;  Location: [facility];  Service: Cardiovascular;  Laterality: N/A;  . CORONARY ANGIOPLASTY WITH STENT PLACEMENT    . CORONARY ARTERY BYPASS GRAFT  11/24/2010  . CORONARY STENT INTERVENTION N/A 09/04/2018   Procedure: CORONARY STENT INTERVENTION;  Surgeon: [name], MD;  Location: [facility];  Service: Cardiovascular;  Laterality: N/A;  . IMPLANTABLE CARDIOVERTER DEFIBRILLATOR (ICD) GENERATOR CHANGE Left 12/12/2015   Procedure: DUAL LEAD PLACEMENT CARDIAC DIFIBRILLATOR;  Surgeon: [name], MD;  Location: [facility];  Service: Cardiovascular;  Laterality: Left;  . LEFT HEART CATH AND CORS/GRAFTS ANGIOGRAPHY N/A 09/04/2018   Procedure: LEFT HEART CATH AND CORS/GRAFTS ANGIOGRAPHY;  Surgeon: [name], MD;  Location: [facility];  Service: Cardiovascular;  Laterality: N/A;  . TONSILLECTOMY      SOCIAL HISTORY:   Social History   Tobacco Use  . Smoking status: Former Smoker    Packs/day: 2.00    Years: 30.00    Pack years: 60.00    Last attempt to quit: 12/03/1996    Years since quitting: 22.1  . Smokeless tobacco: Never Used  Substance Use Topics  . Alcohol use: No    Alcohol/week: 0.0 standard drinks    FAMILY HISTORY:   Family History  Problem Relation Age of Onset  . CAD Mother   . Cancer Mother   . Diabetes Mother   . Alzheimer's disease Father   . Cancer  Father   . Heart disease Father     DRUG ALLERGIES:   Allergies  Allergen Reactions  . Brilinta [Ticagrelor] Shortness Of Breath  . Tuberculin Tests Rash  . Benadryl [Diphenhydramine] Other (See Comments)    " Hyperactivity"  . Doxycycline Swelling    Pt went into pulmonary edema.  . Lopid [Gemfibrozil] Swelling    "I gain 1 pound a day for 30 days."    REVIEW OF SYSTEMS:   ROS -12 point review of system reviewed positive as per HPI otherwise negative  MEDICATIONS AT HOME:    Prior to Admission medications   Medication Sig Start Date End Date Taking? Authorizing Provider  acetaminophen (TYLENOL) 500 MG tablet Take 1,000 mg by mouth daily as needed for moderate pain.   Yes [provider]  albuterol (PROVENTIL HFA;VENTOLIN HFA) 108 (90 Base) MCG/ACT inhaler Inhale 2 puffs into the lungs every 6 (six) hours as needed for wheezing or shortness of breath. 06/04/18  Yes [name], [name], MD  allopurinol (ZYLOPRIM) 100 MG tablet Take 100 mg by mouth daily.   Yes [provider]  aspirin 81 MG chewable tablet Chew 1 tablet (81 mg total) by mouth daily. 09/06/18  Yes Mayo, Pete [name], MD  atorvastatin (LIPITOR) 80 MG tablet Take 1 tablet (80 mg total) by mouth every evening. 04/21/17  Yes Theodoro [name], MD  budesonide-formoterol (SYMBICORT) 80-4.5 MCG/ACT inhaler Inhale 2 puffs into the lungs 2 (two) times daily.   Yes [provider]  calcium carbonate (TUMS - DOSED IN MG ELEMENTAL CALCIUM) 500 MG chewable tablet Chew 1 tablet by mouth as needed for indigestion or heartburn.   Yes [provider]  carvedilol (COREG) 3.125 MG tablet Take 1 tablet (3.125 mg total) by mouth 2 (two) times daily with a meal. 01/01/18  Yes Gladstone [name], MD  clopidogrel (PLAVIX) 75 MG tablet Take 75 mg by mouth daily.   Yes [provider]  co-enzyme Q-10 30 MG capsule Take 30 mg by mouth daily.   Yes [provider]  ferrous sulfate 325 (65 FE) MG tablet Take 325 mg by mouth 2 (two) times daily with a meal.   Yes [provider]  FLUoxetine (PROZAC) 20 MG capsule Take 20 mg by mouth daily.   Yes [provider]  gabapentin (NEURONTIN) 300 MG capsule Take 300 mg by mouth 2 (two) times daily.    Yes [provider]  guaiFENesin (MUCINEX) 600 MG 12 hr tablet Take 600 mg by mouth 2 (two) times daily as needed.    Yes [provider]  insulin glargine (LANTUS) 100 UNIT/ML injection Inject 0.8 mLs (80 Units  total) into the skin at bedtime. 12/12/18  Yes Vaughan Basta, MD  insulin lispro (HUMALOG) 100 UNIT/ML KiwkPen Inject 0-15 Units into the skin 3 (three) times daily with meals. Per sliding scale   Yes [provider]  lansoprazole (PREVACID) 15 MG capsule Take 30 mg by mouth 2 (two) times daily.   Yes [provider]  loratadine (CLARITIN) 10 MG tablet Take 10 mg by mouth daily.   Yes [provider]  Magnesium Oxide 400 (240 Mg) MG TABS Take 1 tablet (400 mg total) by mouth 2 (two) times daily. 05/30/18  Yes Hackney, Otila Kluver A, FNP  Multiple Vitamins-Minerals (CENTRUM SILVER PO) Take 1 tablet by mouth every morning.   Yes [provider]  Multiple Vitamins-Minerals (PRESERVISION AREDS 2) CAPS Take 1 capsule by mouth 2 (two) times daily. For macular degeneration- eye  vitamins   Yes [provider]  nitroGLYCERIN (NITROSTAT) 0.4 MG SL tablet Place 0.4 mg under the tongue every 5 (five) minutes as needed for chest pain.   Yes [provider]  ranolazine (RANEXA) 500 MG 12 hr tablet Take 1 tablet (500 mg total) by mouth 2 (two) times daily. 05/30/18  Yes Darylene Price A, FNP  tamsulosin (FLOMAX) 0.4 MG CAPS capsule Take 0.4 mg by mouth at bedtime.    Yes [provider]  tiotropium (SPIRIVA) 18 MCG inhalation capsule Place 1 capsule (18 mcg total) into inhaler and inhale daily. 06/08/18  Yes Wieting, Richard, MD      VITAL SIGNS:  Blood pressure 124/64, pulse 75, temperature 98.1 F (36.7 C), temperature source Oral, resp. rate 18, height 5\' 7"  (1.702 m), weight 107.4 kg, SpO2 95 %.  PHYSICAL EXAMINATION:  Physical Exam  GENERAL:  73 y.o.-year-old patient lying in the bed with no acute distress.  EYES: Pupils equal, round, reactive to light and accommodation. No scleral icterus. Extraocular muscles intact.  HEENT: Head atraumatic, normocephalic. Oropharynx and nasopharynx clear.  NECK:  Supple, + jugular venous distention. LUNGS:  Bibasilar crackles CARDIOVASCULAR: S1, S2 normal. No murmurs, rubs, or gallops.  ABDOMEN: Soft, nontender, nondistended. Bowel sounds present. No organomegaly or mass.  EXTREMITIES: Trace pedal edema, no cyanosis, or clubbing.  NEUROLOGIC: Cranial nerves II through XII are intact. Muscle strength 5/5 in all extremities. Sensation intact. Gait not checked.  PSYCHIATRIC: The patient is alert and oriented x 3.  SKIN: warm, dry.   LABORATORY PANEL:   CBC Recent Labs  Lab 01/07/19 1845  WBC 5.7  HGB 9.2*  HCT 29.0*  PLT 255   ------------------------------------------------------------------------------------------------------------------  Chemistries  Recent Labs  Lab 01/07/19 1845  NA 141  K 3.7  CL 106  CO2 27  GLUCOSE 126*  BUN 15  CREATININE 1.12  CALCIUM 8.5*   ------------------------------------------------------------------------------------------------------------------  Cardiac Enzymes Recent Labs  Lab 01/08/19 0129  TROPONINI <0.03   ------------------------------------------------------------------------------------------------------------------  RADIOLOGY:  Dg Chest 2 View  Result Date: 01/07/2019 CLINICAL DATA:  Acute chest pain and shortness of breath. EXAM: CHEST - 2 VIEW COMPARISON:  12/28/2018 and prior chest radiographs FINDINGS: Cardiomegaly, CABG and LEFT pacemaker/AICD again noted. Interstitial opacities and very small bilateral pleural effusions again noted. No pneumothorax. No acute bony abnormalities identified. IMPRESSION: Mild interstitial opacities which may represent interstitial edema. Very small bilateral pleural effusions. Cardiomegaly. Electronically Signed   By: Margarette Canada M.D.   On: 01/07/2019 19:12    EKG: Atrial sensed and ventricular paced rhythm  IMPRESSION AND PLAN:   1.  Chest pain in a patient with history of significant coronary artery disease: Troponin x2- in the emergency room.  Likely related to CHF.  Cardiology consult  requested.  Continue home aspirin, Plavix, statin, Coreg.   2.  Acute on chronic systolic CHF exacerbation: Last ejection fraction 15- 20%.  Patient started on IV Lasix 40 mg twice daily.  Monitor on telemetry.  Monitor input and output.  Daily weight.  Continue home medications.  Follow-up cardiology recommendations.  3.  Hypertension: Stable.  Monitor.  Continue home medications.  4.  Diabetes: Monitor CBG.  Sliding scale insulin.  5.  Chronic other medical problems: Monitor.  Continue home medication as ordered.  DVT prophylaxis: Heparin subcutaneous.  Estimated length of stay more than 2 midnights  Further treatment based on clinical course and specialist evaluation.   All the records are reviewed and case discussed with ED provider. Management  plans discussed with the patient, family and they are in agreement.  CODE STATUS: Full  TOTAL TIME TAKING CARE OF THIS PATIENT: 36 minutes.    Sedalia Muta M.D on 01/08/2019 at 2:59 AM  Between 7am to 6pm - Pager - [phone]  After 6pm go to [email or web address] - Proofreader  [facility]  Office  [phone]  CC: Primary care physician; [name], MD

## 2019-01-08 NOTE — Progress Notes (Addendum)
Same-day Progress Note Patient seen in the ED resting comfortably. In no acute distress with wife at bedside. No complaints. Has had several runs of VTach this AM, max 8 beats. Asymptomatic. Monitoring. Cardiology consulted for HFrEF (EF 20% 09/03/18), recurrent VT.  Ripley Fraise, PA

## 2019-01-09 ENCOUNTER — Encounter: Payer: Self-pay | Admitting: Hospitalist

## 2019-01-09 ENCOUNTER — Ambulatory Visit: Payer: PPO | Admitting: Family

## 2019-01-09 LAB — CBC
HCT: 28.6 % — ABNORMAL LOW (ref 39.0–52.0)
Hemoglobin: 9 g/dL — ABNORMAL LOW (ref 13.0–17.0)
MCH: 30.2 pg (ref 26.0–34.0)
MCHC: 31.5 g/dL (ref 30.0–36.0)
MCV: 96 fL (ref 80.0–100.0)
PLATELETS: 239 10*3/uL (ref 150–400)
RBC: 2.98 MIL/uL — ABNORMAL LOW (ref 4.22–5.81)
RDW: 15.4 % (ref 11.5–15.5)
WBC: 5.6 10*3/uL (ref 4.0–10.5)
nRBC: 0 % (ref 0.0–0.2)

## 2019-01-09 LAB — BASIC METABOLIC PANEL
Anion gap: 7 (ref 5–15)
BUN: 15 mg/dL (ref 8–23)
CALCIUM: 8.5 mg/dL — AB (ref 8.9–10.3)
CO2: 28 mmol/L (ref 22–32)
Chloride: 105 mmol/L (ref 98–111)
Creatinine, Ser: 1.17 mg/dL (ref 0.61–1.24)
GFR calc Af Amer: >60 mL/min (ref >60)
GFR calc non Af Amer: >60 mL/min (ref >60)
Glucose, Bld: 135 mg/dL — ABNORMAL HIGH (ref 70–99)
Potassium: 3.9 mmol/L (ref 3.5–5.1)
Sodium: 140 mmol/L (ref 135–145)

## 2019-01-09 LAB — GLUCOSE, CAPILLARY
Glucose-Capillary: 114 mg/dL — ABNORMAL HIGH (ref 70–99)
Glucose-Capillary: 176 mg/dL — ABNORMAL HIGH (ref 70–99)
Glucose-Capillary: 130 mg/dL — ABNORMAL HIGH (ref 70–99)
Glucose-Capillary: 118 mg/dL — ABNORMAL HIGH (ref 70–99)

## 2019-01-09 MED ORDER — INSULIN GLARGINE 100 UNIT/ML ~~LOC~~ SOLN
20.0000 [IU] | Freq: Every day | SUBCUTANEOUS | Status: DC
  Administered 2019-01-09: 20 [IU] via SUBCUTANEOUS
  Filled 2019-01-09 (×2): qty 0.2

## 2019-01-09 NOTE — Progress Notes (Signed)
Inpatient Diabetes Program Recommendations  AACE/ADA: New Consensus Statement on Inpatient Glycemic Control (2015)  Target Ranges:  Prepandial:   less than 140 mg/dL      Peak postprandial:   less than 180 mg/dL (1-2 hours)      Critically ill patients:  140 - 180 mg/dL   Lab Results  Component Value Date   GLUCAP 176 (H) 01/09/2019   HGBA1C 5.9 (H) 12/05/2017    Review of Glycemic ControlResults for [name], [name] (MRN [id number]) as of 01/09/2019 15:58  Ref. Range 01/08/2019 11:59 01/08/2019 17:04 01/08/2019 21:40 01/09/2019 07:31 01/09/2019 11:18  Glucose-Capillary Latest Ref Range: 70 - 99 mg/dL 100 (H) 104 (H) 160 (H) 114 (H) 176 (H)    Diabetes history: DM 2 Outpatient Diabetes medications:  Lantus 55 units q HS, Humalog 0-15 units tid Current orders for Inpatient glycemic control:  Novolog moderate tid with meals and HS Lantus 20 units q HS Inpatient Diabetes Program Recommendations:    Spoke briefly with patient and wife regarding home glycemic control.  He states that he has reduced the amount of Lantus he takes at night due to him "not eating as much".  He states that he is only taking about 55 units q HS at this time.  His last A1C was 5.7%.  I asked if he has low blood sugars and he states "Yes".  He often wakes up with low blood sugars in the mornings.  Explained that this could be due to his Lantus dose being too much.  Patient is only ordered 20 units of Lantus tonight. We also discussed the potential of CGM to help with monitoring and less finger sticks. Wife states she will ask Dr. Gabriel Carina at their next appointment.    Patient states that his son cooks for him and takes him to the grocery store.   -Will assess AM blood sugars in the morning as it seems he likely needs reduction in home dose.   Thanks,  Adah Perl, RN, BC-ADM Inpatient Diabetes Coordinator Pager [phone] (8a-5p)

## 2019-01-09 NOTE — Plan of Care (Signed)
Nutrition Education Note  RD consulted for nutrition education regarding CHF.  RD provided "Low Sodium Nutrition Therapy" handout from the Academy of Nutrition and Dietetics. Reviewed patient's dietary recall. Provided examples on ways to decrease sodium intake in diet. Discouraged intake of processed foods and use of salt shaker. Encouraged fresh fruits and vegetables as well as whole grain sources of carbohydrates to maximize fiber intake.   RD consulted for nutrition education regarding diabetes.   Lab Results  Component Value Date   HGBA1C 5.9 (H) 12/05/2017    RD provided "Nutrition and Type II Diabetes" handout from the Academy of Nutrition and Dietetics. Discussed different food groups and their effects on blood sugar, emphasizing carbohydrate-containing foods. Provided list of carbohydrates and recommended serving sizes of common foods.  Discussed importance of controlled and consistent carbohydrate intake throughout the day. Provided examples of ways to balance meals/snacks and encouraged intake of high-fiber, whole grain complex carbohydrates. Teach back method used.  Expect good compliance.  Body mass index is 35.43 kg/m. Pt meets criteria for obesity based on current BMI.  Current diet order is HH/CHO, patient is consuming approximately 100% of meals at this time. Labs and medications reviewed. No further nutrition interventions warranted at this time. RD contact information provided. If additional nutrition issues arise, please re-consult RD.  Koleen Distance MS, RD, LDN Pager #- [phone] Office#- [phone] After Hours Pager: [phone]

## 2019-01-09 NOTE — Progress Notes (Addendum)
[facility] at [place] NAME: [name]    MR#:  [id number]  DATE OF BIRTH:  [birth year]  SUBJECTIVE:  CHIEF COMPLAINT:   Chief Complaint  Patient presents with  . Chest Pain   Patient has no complaints, seen resting in bed with wife and son at bedside. Has been ambulating with RN frequently with no symptoms. States "when I walk, my oxygen percent goes up!"   No chest pain since admission.   [facility] in contact with Dr. Hilma Favors, want patient to be seen by palliative care while hospitalized.  REVIEW OF SYSTEMS:  Review of Systems  Constitutional: Negative for chills, diaphoresis, fever and malaise/fatigue.  HENT: Negative for congestion and sore throat.   Eyes: Negative for discharge.  Respiratory: Negative for cough, sputum production, shortness of breath and wheezing.   Cardiovascular: Negative for chest pain, palpitations, orthopnea, claudication, leg swelling and PND.  Gastrointestinal: Negative for abdominal pain, constipation and diarrhea.  Genitourinary: Negative for dysuria.  Musculoskeletal: Negative for myalgias.  Neurological: Negative for dizziness and weakness.  Psychiatric/Behavioral: Negative for depression. The patient is not nervous/anxious.     DRUG ALLERGIES:   Allergies  Allergen Reactions  . Brilinta [Ticagrelor] Shortness Of Breath  . Tuberculin Tests Rash  . Benadryl [Diphenhydramine] Other (See Comments)    " Hyperactivity"  . Doxycycline Swelling    Pt went into pulmonary edema.  . Lopid [Gemfibrozil] Swelling    "I gain 1 pound a day for 30 days."   VITALS:  Blood pressure 117/80, pulse 94, temperature 98.5 F (36.9 C), temperature source Oral, resp. rate 19, height 5\' 7"  (1.702 m), weight 102.6 kg, SpO2 95 %. PHYSICAL EXAMINATION:  Physical Exam  GENERAL:  73 y.o.-year-old patient lying in the bed with no acute distress.  EYES: Pupils equal, round, reactive to light and accommodation. No scleral  icterus. Extraocular muscles intact.  HEENT: Head atraumatic, normocephalic. Oropharynx and nasopharynx clear.  NECK:  Supple, + jugular venous distention and hepatojugular reflex on testing.  LUNGS: Trace bibasilar crackles.  CARDIOVASCULAR: S1, S2 normal. No murmurs, rubs, or gallops.  ABDOMEN: Soft, nontender, mildly distended. Bowel sounds present. No organomegaly or mass.  EXTREMITIES: No pedal edema, no cyanosis, or clubbing.  NEUROLOGIC: Cranial nerves II through XII are intact. Muscle strength 5/5 in all extremities. Sensation intact. Gait not checked.  PSYCHIATRIC: The patient is alert and oriented x 3.  SKIN: warm, dry.  LABORATORY PANEL:  Male CBC Recent Labs  Lab 01/09/19 0507  WBC 5.6  HGB 9.0*  HCT 28.6*  PLT 239   ------------------------------------------------------------------------------------------------------------------ Chemistries  Recent Labs  Lab 01/08/19 0459 01/09/19 0507  NA 140 140  K 3.7 3.9  CL 105 105  CO2 28 28  GLUCOSE 110* 135*  BUN 14 15  CREATININE 1.12  1.10 1.17  CALCIUM 8.4* 8.5*  MG 2.2  --   AST 21  --   ALT 18  --   ALKPHOS 68  --   BILITOT 0.8  --    RADIOLOGY:  No results found. ASSESSMENT AND PLAN:   [name]  is a 73 y.o. male with a known history listed below presented to emergency room for evaluation of shortness of breath and chest pain.  Patient has shortness of breath particularly during lying flat.  On admission he could not lie flat and started having chest pain and tightness, associated orthopnea and PND.  Patient was concerned as he has history of 3  heart attacks in the past. In emergency room patient had initial evaluation with EKG chest x-ray and labs.  Patient has negative troponin and EKG without any acute changes.  Chest x-ray with mild interstitial congestion. Patient received aspirin and Lasix in the emergency room.  Hospitalist team requested for admission.  1.  Chest pain in a patient with history  of significant coronary artery disease: Troponin x2- in the emergency room.  Likely related to CHF.   Cardiology consult requested: recommendation to ambulate the patient and schedule outpatient follow-up. Recommend patient meet with palliative care, c/s placed and plan for meeting tomorrow.   Continue home aspirin, Plavix, statin, Coreg.   2.  Acute on chronic systolic CHF exacerbation: Last ejection fraction  20%.  Patient started on IV Lasix 40 mg twice daily.  Monitor on telemetry.  Monitor input and output.  Daily weight.  Continue home medications.   Follow-up cardiology recommendations as per above.   3.  Hypertension: Stable.  Monitor.  Continue home medications.  4.  Diabetes with peripheral neuropathy: Monitor CBG.  Sliding scale insulin. Continue gabapentin.  5.  GERD: Continue home medication as ordered.  6. COPD:  continue home inhalers.  7. Anemia: hgb 9.0, will monitor. Continue iron.  8. Hyperlipidemia: continue statin   9. OSA: continue to use CPAP  10. Gout: continue home medications as ordered  11. Depression: continue home medications as ordered  DVT prophylaxis: Heparin subcutaneous.  All the records are reviewed and case is discussed with Care Management/Social Worker. Management plans discussed with the patient and/or family and they are in agreement.  CODE STATUS: Full Code  TOTAL TIME TAKING CARE OF THIS PATIENT: 30 minutes.   More than 50% of the time was spent in counseling/coordination of care: YES  D/C IN 1 DAY, DEPENDING Palliative care meeting.   Ripley Fraise PA-C on 01/09/2019 at 2:47 PM  Between 7am to 6pm - Pager - [phone]  After 6 pm go to [email or web address] - Proofreader  [facility]  Office  [phone]  CC: Primary care physician; [name], MD

## 2019-01-09 NOTE — Consult Note (Signed)
Reason for Consult: Shortness of breath dyspnea chest pain Referring Physician: Dr. Posey Pronto hospitalist Dr. Netty Starring primary Cardiologist Dr. Onalee Hua ARNAV CREGG is an 73 y.o. male.  HPI: 73 year old male history of congestive heart failure systolic dysfunction known coronary disease history of coronary bypass surgery PCI and stent in the past underlying ischemic cardiomyopathy ejection fraction thought to be moderate to severely depressed.  Patient presented with acute dyspnea PND and orthopnea unable to catch his breath was brought to the emergency room and found to have congestive heart failure on chest x-ray mild hypoxemia treated aggressively with diuretic therapy he complained of some mild chest pain symptoms as well.  Patient also had leg edema.  Patient now resting comfortably on his CPAP feels much better with improved respiratory status  Past Medical History:  Diagnosis Date  . Anemia   . Cancer ([place]) 12/2013   prostate  . Cardiogenic pulmonary edema ([place]) 12/19/2014  . Cardiomyopathy, ischemic   . CHF (congestive heart failure) ([place])   . COPD (chronic obstructive pulmonary disease) ([place])   . Depression   . Diabetes mellitus without complication ([place])   . GERD (gastroesophageal reflux disease)   . Gout   . Hypercholesteremia   . Hypertension   . Myocardial infarction ([place]) [id number]  . OSA on CPAP   . Shortness of breath dyspnea     Past Surgical History:  Procedure Laterality Date  . CARDIAC CATHETERIZATION N/A 02/02/2016   Procedure: Left Heart Cath and Coronary Angiography;  Surgeon: [name], MD;  Location: [place] [facility];  Service: Cardiovascular;  Laterality: N/A;  . CORONARY ANGIOPLASTY WITH STENT PLACEMENT    . CORONARY ARTERY BYPASS GRAFT  11/24/2010  . CORONARY STENT INTERVENTION N/A 09/04/2018   Procedure: CORONARY STENT INTERVENTION;  Surgeon: [name], MD;  Location: [place] [facility];  Service: Cardiovascular;  Laterality:  N/A;  . IMPLANTABLE CARDIOVERTER DEFIBRILLATOR (ICD) GENERATOR CHANGE Left 12/12/2015   Procedure: DUAL LEAD PLACEMENT CARDIAC DIFIBRILLATOR;  Surgeon: [name], MD;  Location: [facility];  Service: Cardiovascular;  Laterality: Left;  . LEFT HEART CATH AND CORS/GRAFTS ANGIOGRAPHY N/A 09/04/2018   Procedure: LEFT HEART CATH AND CORS/GRAFTS ANGIOGRAPHY;  Surgeon: [name], MD;  Location: [place] [facility];  Service: Cardiovascular;  Laterality: N/A;  . TONSILLECTOMY      Family History  Problem Relation Age of Onset  . CAD Mother   . Cancer Mother   . Diabetes Mother   . Alzheimer's disease Father   . Cancer Father   . Heart disease Father     Social History:  reports that he quit smoking about 22 years ago. He has a 60.00 pack-year smoking history. He has never used smokeless tobacco. He reports that he does not drink alcohol or use drugs.  Allergies:  Allergies  Allergen Reactions  . Brilinta [Ticagrelor] Shortness Of Breath  . Tuberculin Tests Rash  . Benadryl [Diphenhydramine] Other (See Comments)    " Hyperactivity"  . Doxycycline Swelling    Pt went into pulmonary edema.  . Lopid [Gemfibrozil] Swelling    "I gain 1 pound a day for 30 days."    Medications: I have reviewed the patient's current medications.  Results for orders placed or performed during the hospital encounter of 01/08/19 (from the past 48 hour(s))  Troponin I - ONCE - STAT     Status: None   Collection Time: 01/08/19  1:29 AM  Result Value Ref Range   Troponin  I <0.03 <0.03 ng/mL    Comment: Performed at [facility], [address] [place], [place] [address]  Magnesium     Status: None   Collection Time: 01/08/19  4:59 AM  Result Value Ref Range   Magnesium 2.2 1.7 - 2.4 mg/dL    Comment: Performed at [facility], [address] [place], [place] [address]  Creatinine, serum     Status: None   Collection Time: 01/08/19  4:59 AM  Result Value Ref Range    Creatinine, Ser 1.12 0.61 - 1.24 mg/dL   GFR calc non Af Amer >60 >60 mL/min   GFR calc Af Amer >60 >60 mL/min    Comment: Performed at [facility], [address] [place], [place] [address]  CBC     Status: Abnormal   Collection Time: 01/08/19  4:59 AM  Result Value Ref Range   WBC 6.0 4.0 - 10.5 K/uL   RBC 2.92 (L) 4.22 - 5.81 MIL/uL   Hemoglobin 8.7 (L) 13.0 - 17.0 g/dL   HCT 28.0 (L) 39.0 - 52.0 %   MCV 95.9 80.0 - 100.0 fL   MCH 29.8 26.0 - 34.0 pg   MCHC 31.1 30.0 - 36.0 g/dL   RDW 15.5 11.5 - 15.5 %   Platelets 256 150 - 400 K/uL   nRBC 0.0 0.0 - 0.2 %    Comment: Performed at [facility], [address] [place], [place] [address]  Troponin I - Now Then Q6H     Status: None   Collection Time: 01/08/19  4:59 AM  Result Value Ref Range   Troponin I <0.03 <0.03 ng/mL    Comment: Performed at [facility], [address] [place],  [address]  Comprehensive metabolic panel     Status: Abnormal   Collection Time: 01/08/19  4:59 AM  Result Value Ref Range   Sodium 140 135 - 145 mmol/L   Potassium 3.7 3.5 - 5.1 mmol/L   Chloride 105 98 - 111 mmol/L   CO2 28 22 - 32 mmol/L   Glucose, Bld 110 (H) 70 - 99 mg/dL   BUN 14 8 - 23 mg/dL   Creatinine, Ser 1.10 0.61 - 1.24 mg/dL   Calcium 8.4 (L) 8.9 - 10.3 mg/dL   Total Protein 6.6 6.5 - 8.1 g/dL   Albumin 3.6 3.5 - 5.0 g/dL   AST 21 15 - 41 U/L   ALT 18 0 - 44 U/L   Alkaline Phosphatase 68 38 - 126 U/L   Total Bilirubin 0.8 0.3 - 1.2 mg/dL   GFR calc non Af Amer >60 >60 mL/min   GFR calc Af Amer >60 >60 mL/min   Anion gap 7 5 - 15    Comment: Performed at [facility], [address] [place], [place] [address]  Glucose, capillary     Status: None   Collection Time: 01/08/19  7:47 AM  Result Value Ref Range   Glucose-Capillary 92 70 - 99 mg/dL  Troponin I - Now Then Q6H     Status: None   Collection Time: 01/08/19 10:18 AM  Result Value Ref Range   Troponin I <0.03 <0.03  ng/mL    Comment: Performed at [facility], [address] [place], [place] [address]  Glucose, capillary     Status: Abnormal   Collection Time: 01/08/19 11:59 AM  Result Value Ref Range   Glucose-Capillary 100 (H) 70 - 99 mg/dL  Troponin I - Now  Then Q6H     Status: None   Collection Time: 01/08/19  3:50 PM  Result Value Ref Range   Troponin I <0.03 <0.03 ng/mL    Comment: Performed at [facility], [address] [place], [place] [address]  Glucose, capillary     Status: Abnormal   Collection Time: 01/08/19  5:04 PM  Result Value Ref Range   Glucose-Capillary 104 (H) 70 - 99 mg/dL   Comment 1 Notify RN   Glucose, capillary     Status: Abnormal   Collection Time: 01/08/19  9:40 PM  Result Value Ref Range   Glucose-Capillary 160 (H) 70 - 99 mg/dL   Comment 1 Notify RN    Comment 2 Document in Chart   CBC     Status: Abnormal   Collection Time: 01/09/19  5:07 AM  Result Value Ref Range   WBC 5.6 4.0 - 10.5 K/uL   RBC 2.98 (L) 4.22 - 5.81 MIL/uL   Hemoglobin 9.0 (L) 13.0 - 17.0 g/dL   HCT 28.6 (L) 39.0 - 52.0 %   MCV 96.0 80.0 - 100.0 fL   MCH 30.2 26.0 - 34.0 pg   MCHC 31.5 30.0 - 36.0 g/dL   RDW 15.4 11.5 - 15.5 %   Platelets 239 150 - 400 K/uL   nRBC 0.0 0.0 - 0.2 %    Comment: Performed at [facility], [address] [place], [place] [address]  Basic metabolic panel     Status: Abnormal   Collection Time: 01/09/19  5:07 AM  Result Value Ref Range   Sodium 140 135 - 145 mmol/L   Potassium 3.9 3.5 - 5.1 mmol/L   Chloride 105 98 - 111 mmol/L   CO2 28 22 - 32 mmol/L   Glucose, Bld 135 (H) 70 - 99 mg/dL   BUN 15 8 - 23 mg/dL   Creatinine, Ser 1.17 0.61 - 1.24 mg/dL   Calcium 8.5 (L) 8.9 - 10.3 mg/dL   GFR calc non Af Amer >60 >60 mL/min   GFR calc Af Amer >60 >60 mL/min   Anion gap 7 5 - 15    Comment: Performed at [facility], [address] [place],  [address]  Glucose, capillary     Status: Abnormal    Collection Time: 01/09/19  7:31 AM  Result Value Ref Range   Glucose-Capillary 114 (H) 70 - 99 mg/dL   Comment 1 Notify RN    Comment 2 Document in Chart   Glucose, capillary     Status: Abnormal   Collection Time: 01/09/19 11:18 AM  Result Value Ref Range   Glucose-Capillary 176 (H) 70 - 99 mg/dL   Comment 1 Notify RN    Comment 2 Document in Chart   Glucose, capillary     Status: Abnormal   Collection Time: 01/09/19  4:37 PM  Result Value Ref Range   Glucose-Capillary 130 (H) 70 - 99 mg/dL   Comment 1 Notify RN    Comment 2 Document in Chart     Dg Chest 2 View  Result Date: 01/07/2019 CLINICAL DATA:  Acute chest pain and shortness of breath. EXAM: CHEST - 2 VIEW COMPARISON:  12/28/2018 and prior chest radiographs FINDINGS: Cardiomegaly, CABG and LEFT pacemaker/AICD again noted. Interstitial opacities and very small bilateral pleural effusions again noted. No pneumothorax. No acute bony abnormalities identified. IMPRESSION: Mild interstitial opacities which may represent interstitial edema. Very small bilateral pleural effusions. Cardiomegaly. Electronically Signed   By: Dellis Filbert  Hu M.D.   On: 01/07/2019 19:12    Review of Systems  Constitutional: Positive for malaise/fatigue.  HENT: Positive for congestion.   Eyes: Negative.   Respiratory: Positive for cough and shortness of breath.   Cardiovascular: Positive for orthopnea, leg swelling and PND.  Gastrointestinal: Negative.   Genitourinary: Negative.   Musculoskeletal: Negative.   Skin: Negative.   Neurological: Negative.   Endo/Heme/Allergies: Negative.   Psychiatric/Behavioral: Negative.    Blood pressure (!) 101/43, pulse 83, temperature 98.2 F (36.8 C), temperature source Oral, resp. rate 19, height 5\' 7"  (1.702 m), weight 102.6 kg, SpO2 94 %. Physical Exam  Nursing note and vitals reviewed. Constitutional: He is oriented to person, place, and time. He appears well-developed and well-nourished.  HENT:  Head:  Normocephalic and atraumatic.  Eyes: Pupils are equal, round, and reactive to light. Conjunctivae and EOM are normal.  Neck: Normal range of motion. Neck supple.  Cardiovascular: Normal rate and regular rhythm.  Murmur heard. Respiratory: Effort normal. He has decreased breath sounds. He has rhonchi.  GI: Soft. Bowel sounds are normal.  Musculoskeletal:        General: Edema present.  Neurological: He is alert and oriented to person, place, and time. He has normal reflexes.  Skin: Skin is warm and dry.  Psychiatric: He has a normal mood and affect.    Assessment/Plan: Congestive heart failure Hypertension Obesity Cardiomyopathy systolic dysfunction Lower extremity edema Diabetes GERD Hyperlipidemia Shortness of breath Obstructive sleep apnea Coronary bypass graft December 2011 History of PCI and stent 2019 . Agree with telemetry Follow-up EKGs and troponins Recommend intravenous diuretic therapy with Lasix Continue heart failure therapy Maintain diabetes management and control Tailor hypertension medications to assist with heart failure therapy Continue CPAP for obstructive sleep apnea Recommend maintaining DVT prophylaxis Supplemental oxygen as necessary for hypoxemia Recommend inhalers for COPD Increase activity Hopefully discharge home and follow-up if patient able to ambulate adequately without dyspnea    Rebekah Zackery D Monika Chestang 01/09/2019, 6:54 PM

## 2019-01-09 NOTE — Progress Notes (Signed)
SATURATION QUALIFICATIONS: (This note is used to comply with regulatory documentation for home oxygen)  Patient Saturations on Room Air at Rest = 95%  Patient Saturations on Room Air while Ambulating = 93%  Patient Saturations on n/a Liters of oxygen while Ambulating = n/a  Please briefly explain why patient needs home oxygen:  Pt ambulated around nurses station x1, tolerated well.

## 2019-01-10 DIAGNOSIS — Z7189 Other specified counseling: Secondary | ICD-10-CM

## 2019-01-10 DIAGNOSIS — I509 Heart failure, unspecified: Secondary | ICD-10-CM

## 2019-01-10 DIAGNOSIS — Z515 Encounter for palliative care: Secondary | ICD-10-CM

## 2019-01-10 LAB — GLUCOSE, CAPILLARY
Glucose-Capillary: 137 mg/dL — ABNORMAL HIGH (ref 70–99)
Glucose-Capillary: 159 mg/dL — ABNORMAL HIGH (ref 70–99)

## 2019-01-10 NOTE — Progress Notes (Signed)
Cannot take AM vital signs, no dynamap available. [name] [facility]

## 2019-01-10 NOTE — Care Management Note (Addendum)
Case Management Note  Patient Details  Name: [name] MRN: [id number] Date of Birth: November 22, 1946  Subjective/Objective:      Patient is from home with wife.  Discharging today.  Independent in all ADL's.  Drives to [employer] every morning for breakfast. Will not qualify for [facility] services as he plans to continue to do this.  Current with PCP.  Obtains medications through the [place] and at [facility] without difficulty.  Does not use any equipment at home.  Has a functioning scale and weighs daily.  Current patient with the HF clinic.  ses a CPAP at night and oxygen PRN.  Pending palliative consult and expect discharge later today.  No further needs identified at this time.               Action/Plan:  [facility] referral made.   Expected Discharge Date:                  Expected Discharge Plan:  Home/Self Care  In-House Referral:     Discharge planning Services  CM Consult  Post Acute Care Choice:    Choice offered to:     DME Arranged:    DME Agency:     HH Arranged:    [place] Agency:     Status of Service:  Completed, signed off  If discussed at [employer] of Stay Meetings, dates discussed:    Additional Comments:  Elza Rafter, RN 01/10/2019, 11:49 AM

## 2019-01-10 NOTE — Discharge Summary (Signed)
[name] at [place] NAME: [name]    MR#:  [id number]  DATE OF BIRTH:  Mar 19, 1946  DATE OF ADMISSION:  01/08/2019   ADMITTING PHYSICIAN: Sedalia Muta, MD  DATE OF DISCHARGE: 01/10/2019   PRIMARY CARE PHYSICIAN: [name], MD   ADMISSION DIAGNOSIS:  Acute on chronic congestive heart failure, unspecified heart failure type ([place]) [I50.9] DISCHARGE DIAGNOSIS:  Active Problems:   CHF (congestive heart failure) ([place])  SECONDARY DIAGNOSIS:   Past Medical History:  Diagnosis Date  . Anemia   . Cancer ([place]) 12/2013   prostate  . Cardiogenic pulmonary edema ([place]) 12/19/2014  . Cardiomyopathy, ischemic   . CHF (congestive heart failure) ([place])   . COPD (chronic obstructive pulmonary disease) ([place])   . Depression   . Diabetes mellitus without complication ([place])   . GERD (gastroesophageal reflux disease)   . Gout   . Hypercholesteremia   . Hypertension   . Myocardial infarction ([place]) [id number]  . OSA on CPAP   . Shortness of breath dyspnea    HOSPITAL COURSE:  Daniel a72 y.o.malewith a known historylisted below presented to emergency room for evaluation of shortness of breath and chest pain. On admission he could not lie flat and started having chest pain and tightness. [name] was concerned as he has history of 3 heart attacks in the past. In emergency room [name] had initial evaluation with EKG chest x-ray and labs. [name] had serially negative troponin and EKG without any acute changes. Chest x-ray with mild interstitial congestion. [name] received aspirin and Lasix in the emergency room. Hospitalist team requested for admission.  1.Chest pain in a [name] with history of significant coronary artery disease: Troponin serially negative in the emergency room. EKG without acute changes. Chest painlikely related to CHF, low ejection fraction of 20%.  -Cardiology consulted: recommendation to  ambulate the [name] and schedule outpatient follow-up for optimization of his heart failure regimen. No dyspnea on ambulation.  2.Acute on chronic systolic CHF exacerbation:Last ejection fraction 20%. [name] started on IV Lasix 40 mg twice daily while admitted. Monitored on telemetry. Monitored input and output. Daily weights.  Follow-up with outpatient cardiology  as above.  - Continue home aspirin, Plavix, statin, Coreg, Nitrostat PRN; Will defer any regimen changes to outpatient Cardiologist/HF clinic/PCP as [name] has close follow-up scheduled;   Pharmacy c/s during admission and recommends addition of a low-dose ACE inhibitor per current heart failure guidelines and diabetic status - met with palliative care; outpatient palliative care referral placed at [name] request. -  was evaluated and found to not be a candidate for home health heart failure program as he is not home-bound.  3.Hypertension:Stable. Continue home medications.  4.Diabetes with peripheral neuropathy:Monitored CBG. Sliding scale insulin while admitted. Continue gabapentin, outpatient insulin regimen.  5.GERD:Stable while admitted. At discharge [name] expressed desire to try a new regimen for GERD management at home.  Will defer changes to follow-up with PCP. Brief counseling also provided on diet/lifestyle changes.   6.  COPD:  continue home inhalers.  7.  Anemia: hgb stable at 9.0. Continue iron.  8.  Hyperlipidemia: continue statin   9.  OSA: continue to use CPAP  10. Gout: continue home medications as ordered  11. Depression: continue home medications as ordered  DVT prophylaxis:Heparin subcutaneous while admitted.   DISCHARGE CONDITIONS:  stable CONSULTS OBTAINED:  Treatment Team:  Yolonda Kida, MD DRUG ALLERGIES:   Allergies  Allergen Reactions  . Brilinta [Ticagrelor] Shortness Of  Breath  . Tuberculin Tests Rash  . Benadryl [Diphenhydramine] Other (See  Comments)    " Hyperactivity"  . Doxycycline Swelling    Pt went into pulmonary edema.  . Lopid [Gemfibrozil] Swelling    "I gain 1 pound a day for 30 days."   DISCHARGE MEDICATIONS:   Allergies as of 01/10/2019      Reactions   Brilinta [ticagrelor] Shortness Of Breath   Tuberculin Tests Rash   Benadryl [diphenhydramine] Other (See Comments)   " Hyperactivity"   Doxycycline Swelling   Pt went into pulmonary edema.   Lopid [gemfibrozil] Swelling   "I gain 1 pound a day for 30 days."      Medication List    STOP taking these medications   guaiFENesin 600 MG 12 hr tablet Commonly known as:  MUCINEX     TAKE these medications   acetaminophen 500 MG tablet Commonly known as:  TYLENOL Take 1,000 mg by mouth daily as needed for moderate pain.   albuterol 108 (90 Base) MCG/ACT inhaler Commonly known as:  PROVENTIL HFA;VENTOLIN HFA Inhale 2 puffs into the lungs every 6 (six) hours as needed for wheezing or shortness of breath.   allopurinol 100 MG tablet Commonly known as:  ZYLOPRIM Take 100 mg by mouth daily.   aspirin 81 MG chewable tablet Chew 1 tablet (81 mg total) by mouth daily.   atorvastatin 80 MG tablet Commonly known as:  LIPITOR Take 1 tablet (80 mg total) by mouth every evening.   budesonide-formoterol 80-4.5 MCG/ACT inhaler Commonly known as:  SYMBICORT Inhale 2 puffs into the lungs 2 (two) times daily.   calcium carbonate 500 MG chewable tablet Commonly known as:  TUMS - dosed in mg elemental calcium Chew 1 tablet by mouth as needed for indigestion or heartburn.   carvedilol 3.125 MG tablet Commonly known as:  COREG Take 1 tablet (3.125 mg total) by mouth 2 (two) times daily with a meal.   CENTRUM SILVER PO Take 1 tablet by mouth every morning.   PRESERVISION AREDS 2 Caps Take 1 capsule by mouth 2 (two) times daily. For macular degeneration- eye vitamins   clopidogrel 75 MG tablet Commonly known as:  PLAVIX Take 75 mg by mouth daily.     co-enzyme Q-10 30 MG capsule Take 30 mg by mouth daily.   ferrous sulfate 325 (65 FE) MG tablet Take 325 mg by mouth 2 (two) times daily with a meal.   FLUoxetine 20 MG capsule Commonly known as:  PROZAC Take 20 mg by mouth daily.   gabapentin 300 MG capsule Commonly known as:  NEURONTIN Take 300 mg by mouth 2 (two) times daily.   insulin glargine 100 UNIT/ML injection Commonly known as:  LANTUS Inject 0.8 mLs (80 Units total) into the skin at bedtime.   insulin lispro 100 UNIT/ML KiwkPen Commonly known as:  HUMALOG Inject 0-15 Units into the skin 3 (three) times daily with meals. Per sliding scale   lansoprazole 15 MG capsule Commonly known as:  PREVACID Take 30 mg by mouth 2 (two) times daily.   loratadine 10 MG tablet Commonly known as:  CLARITIN Take 10 mg by mouth daily.   Magnesium Oxide 400 (240 Mg) MG Tabs Take 1 tablet (400 mg total) by mouth 2 (two) times daily.   nitroGLYCERIN 0.4 MG SL tablet Commonly known as:  NITROSTAT Place 0.4 mg under the tongue every 5 (five) minutes as needed for chest pain.   ranolazine 500 MG 12 hr tablet Commonly  known as:  RANEXA Take 1 tablet (500 mg total) by mouth 2 (two) times daily.   tamsulosin 0.4 MG Caps capsule Commonly known as:  FLOMAX Take 0.4 mg by mouth at bedtime.   tiotropium 18 MCG inhalation capsule Commonly known as:  SPIRIVA Place 1 capsule (18 mcg total) into inhaler and inhale daily.        DISCHARGE INSTRUCTIONS:   DIET:  Cardiac diet DISCHARGE CONDITION:  Stable ACTIVITY:  Activity as tolerated OXYGEN:  Home Oxygen: No.  Oxygen Delivery: room air DISCHARGE LOCATION:  home   If you experience worsening of your admission symptoms, develop shortness of breath, life threatening emergency, suicidal or homicidal thoughts you must seek medical attention immediately by calling 911 or calling your MD immediately if your symptoms are severe.  You Must read complete instructions/literature  along with all the possible adverse reactions/side effects for all the medicines you take and that have been prescribed to you. Take any new medicines only after you have completely understood and accept all the possible adverse reactions/side effects.   Please note  You were cared for by a hospitalist during your hospital stay. If you have any questions about your discharge medications or the care you received while you were in the hospital after you are discharged, you can call the unit and asked to speak with the hospitalist on call if the hospitalist that took care of you is not available. Once you are discharged, your primary care physician will handle any further medical issues. Please note that NO REFILLS for any discharge medications will be authorized once you are discharged, as it is imperative that you return to your primary care physician (or establish a relationship with a primary care physician if you do not have one) for your aftercare needs so that they can reassess your need for medications and monitor your lab values.    On the day of Discharge:  VITAL SIGNS:  Blood pressure (!) 112/49, pulse 72, temperature 98 F (36.7 C), temperature source Oral, resp. rate 19, height _0  (1.702 m), weight 101.5 kg, SpO2 100 %. PHYSICAL EXAMINATION:  Physical Exam GENERAL:[name] lying in the bed with no acute distress.  EYES: Pupils equal, round, reactive to light and accommodation. No scleral icterus. Extraocular muscles intact.  HEENT: Head atraumatic, normocephalic. Oropharynx and nasopharynx clear.  NECK: Supple, +jugular venous distention and hepatojugular reflex on testing.  LUNGS:Clear to auscultation bilaterally. No wheezes, rales, rhonchi.  CARDIOVASCULAR: S1, S2 normal. No murmurs, rubs, or gallops.  ABDOMEN: Soft, nontender, mildly distended. Bowel sounds present. No organomegaly or mass.  EXTREMITIES:Nopedal edema, nocyanosis, or clubbing. 2+ radial and  dorsalis pedis pulses.  NEUROLOGIC: Cranial nerves II through XII are intact. Muscle strength 5/5 in all extremities. Sensation intact. Gait not checked.  PSYCHIATRIC: The [name] is alert and oriented x 3.  SKIN:warm, dry.  DATA REVIEW:   CBC Recent Labs  Lab 01/09/19 0507  WBC 5.6  HGB 9.0*  HCT 28.6*  PLT 239    Chemistries  Recent Labs  Lab 01/08/19 0459 01/09/19 0507  NA 140 140  K 3.7 3.9  CL 105 105  CO2 28 28  GLUCOSE 110* 135*  BUN 14 15  CREATININE 1.12  1.10 1.17  CALCIUM 8.4* 8.5*  MG 2.2  --   AST 21  --   ALT 18  --   ALKPHOS Daniel  --   BILITOT 0.8  --     RADIOLOGY:  CLINICAL DATA:  Acute chest pain and shortness of breath. EXAM: CHEST - 2 VIEW COMPARISON:  12/28/2018 and prior chest radiographs FINDINGS: Cardiomegaly, CABG and LEFT pacemaker/AICD again noted. Interstitial opacities and very small bilateral pleural effusions again noted. No pneumothorax. No acute bony abnormalities identified.  IMPRESSION: Mild interstitial opacities which may represent interstitial edema. Very small bilateral pleural effusions. Cardiomegaly.  Electronically Signed   By: Margarette Canada M.D.   On: 01/07/2019 19:12   Management plans discussed with the [name] and/or family and they are in agreement.  CODE STATUS: Full Code   TOTAL TIME TAKING CARE OF THIS [name]: 45 minutes.    Ripley Fraise PA-C on 01/10/2019 at 4:44 PM  [facility]  Office  [phone]  CC: Primary care physician; [name], MD

## 2019-01-10 NOTE — Discharge Instructions (Signed)
You were hospitalized with chest pain, our work-up showed that you were not having any serious acute events and that this chest pain was probably from your chronic heart failure.  Please follow-up with Dr. Netty Starring at 11:15 AM on 01/15/2019 for hospital follow-up.  Please follow-up as scheduled with your cardiologist Dr. Nehemiah Massed on 01/17/2019.  Please follow-up at the heart failure clinic on 01/16/2019 @ 1:20 PM.   We have placed a referral for outpatient Palliative care as discussed in the hospital.

## 2019-01-10 NOTE — Consult Note (Signed)
Consultation Note Date: 01/10/2019   Patient Name: [name]  DOB: 1946-04-10  MRN: [id number]  Age / Sex: 73 y.o., male  PCP: [name], MD Referring Physician: Max Sane, MD  Reason for Consultation: Establishing goals of care  HPI/Patient Profile: 73 y.o. male admitted on 01/08/2019 from home with complaints of chest pain and shortness of breath.  He has a past medical history significant for COPD (prn home oxygen), sleep apnea (CPAP), hypertension, obesity, diabetes, hyperlipidemia, coronary bypass (11/2010), PCI and stent (2019), and prostate cancer.  Presented to the ED with complaints of worsening shortness of breath and chest pain when attempting to lie down.  Patient reported some relief when sitting upright.  He was concerned given he has had 3 heart attacks in the past.  Initial EKG was negative for acute changes.  Troponin negative.  Potassium 3.7, sodium 140, WBC 6.0, hemoglobin 8.7.  X-ray showed mild interstitial opacities which may represent interstitial edema and small bilateral pleural effusions. Patient received aspirin and lasix in ED. Since admission patient patient receiving IV lasix and home medications. He is being seen by cardiology who has recommended continued ambulation and outpatient-follow up. Palliative Medicine team consulted for goals of care.   Clinical Assessment and Goals of Care: I have reviewed medical records including lab results, imaging, Epic notes, and MAR, received report from the bedside RN, and assessed the patient. I met at the bedside with patient and his wife, [name] to discuss diagnosis prognosis, [place], EOL wishes, disposition and options. Patient is A&O x3. He denies pain or shortness of breath.   I introduced Palliative Medicine as specialized medical care for people living with serious illness. It focuses on providing relief from the symptoms and stress  of a serious illness. The goal is to improve quality of life for both the patient and the family.  We discussed a brief life review of the patient . Patient reports he is 100%disabled. 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 He and his wife [name] have been married for 72 years and have a son and daugter, who both live local. His son is a Camera operator and lives in the house next door to them.   As far as functional and nutritional status patient reports he is fairly independent in all ADLs. He does report some generalized weakness and/or fatigue at times. Reports he has oxygen in the home for as needed use. His son provides all of their meals and will generally come over and cook on his days off. If he is working he prepares meals they can eat or they will eat sandwiches. Patient states his son or wife drives him to [facility] several times a week as he likes to go there and socialize and have breakfast. He states he has a scale in the home and weighs daily and records. He denies lower extremity swelling and states he usually has abdominal  swelling. His son or wife drives him to appointments.   We discussed his current illness and what it means in the larger context of his on-going co-morbidities.  Natural disease trajectory and expectations at EOL were discussed. Patient and wife verbalizes understanding of patient current illness and co-morbidities. He expressed he knows there are some lifestyle modifications that he must do one with changes in eating habits, which he has recently started.   I attempted to elicit values and goals of care important to the patient.    Patient states he follows up with Rickard Patience at the heart failure clinic monthly and also his other providers. I explained and offered outpatient palliative and other community resources such as community heart failure programs. Patient  and wife verbalized understanding. They are requesting for outpatient palliative for support and if he qualifies open to a community based heart failure program.   Patient expresses he has a documented advanced directive. He states he thought he brought a copy to hospital last admission, however, he will make sure a copy is brought even if he is discharged. He verbalizes his wife is his HCPOA. I discussed in detail his current full code status with consideration to his current illness and co-morbidities. Patient expresses he is aware his condition will worsen over time, however he wishes to remain a full code and if required life-support. He reports in the event he is in a vegetative state or it appears he would not be able to come off of life-support or require tracheostomy his wife and children are aware that at that point he wishes to be kept comfortable and pass away. He does not want a tracheostomy or any forms of artificial feedings/PEG tube.   Questions and concerns were addressed.  Hard Choices booklet left for review. The family was encouraged to call with questions or concerns.  PMT will continue to support holistically as needed.   Primary Decision Maker: HCPOA   SUMMARY OF RECOMMENDATIONS    Full Code-per patient  Continue to treat the treatable  Patient remains hopeful for continuous improvement and support. Verbalizes awareness of his heart failure and long-term obstacles with his condition, however, is hopeful for better management and decreased hospitalizations.   Requesting outpatient palliative and home health heart failure program if available.   Case Management referral for both services.   Code Status/Advance Care Planning:  Full code   Symptom Management:   Per attending   Palliative Prophylaxis:   Bowel Regimen  Additional Recommendations (Limitations, Scope, Preferences):  Full Scope Treatment, No Artificial Feeding and No  Tracheostomy  Psycho-social/Spiritual:   Desire for further Chaplaincy support:NO   Additional Recommendations: Referral to [employer]  and [employer] based heart failure program or support  Prognosis:   Unable to determine-Guarded in the setting of COPD, CHF, depression, diabetes, cardiogenic pulmonary edema, prostate cancer, and gout.   Discharge Planning: Home with Palliative Services      Primary Diagnoses: Present on Admission: **None**   I have reviewed the medical record, interviewed the patient and family, and examined the patient. The following aspects are pertinent.  Past Medical History:  Diagnosis Date  . Anemia   . Cancer ([place]) 12/2013   prostate  . Cardiogenic pulmonary edema ([place]) 12/19/2014  . Cardiomyopathy, ischemic   . CHF (congestive heart failure) ([place])   . COPD (chronic obstructive pulmonary disease) ([place])   . Depression   . Diabetes mellitus without complication ([place])   . GERD (gastroesophageal reflux disease)   .  Gout   . Hypercholesteremia   . Hypertension   . Myocardial infarction ([place]) [id number]  . OSA on CPAP   . Shortness of breath dyspnea    Social History   Socioeconomic History  . Marital status: Married    Spouse name: Not on file  . Number of children: Not on file  . Years of education: 76  . Highest education level: High school graduate  Occupational History  . Occupation: retired  Scientific laboratory technician  . Financial resource strain: Not hard at all  . Food insecurity:    Worry: Never true    Inability: Never true  . Transportation needs:    Medical: No    Non-medical: No  Tobacco Use  . Smoking status: Former Smoker    Packs/day: 2.00    Years: 30.00    Pack years: 60.00    Last attempt to quit: 12/03/1996    Years since quitting: 22.1  . Smokeless tobacco: Never Used  Substance and Sexual Activity  . Alcohol use: No    Alcohol/week: 0.0 standard drinks  . Drug use: No  . Sexual activity: Not Currently   Lifestyle  . Physical activity:    Days per week: 0 days    Minutes per session: 0 min  . Stress: Not at all  Relationships  . Social connections:    Talks on phone: More than three times a week    Gets together: More than three times a week    Attends religious service: More than 4 times per year    Active member of club or organization: No    Attends meetings of clubs or organizations: Never    Relationship status: Married  Other Topics Concern  . Not on file  Social History Narrative  . Not on file   Family History  Problem Relation Age of Onset  . CAD Mother   . Cancer Mother   . Diabetes Mother   . Alzheimer's disease Father   . Cancer Father   . Heart disease Father    Scheduled Meds: . allopurinol  100 mg Oral Daily  . aspirin  81 mg Oral Daily  . atorvastatin  80 mg Oral QPM  . carvedilol  3.125 mg Oral BID WC  . clopidogrel  75 mg Oral Daily  . ferrous sulfate  325 mg Oral BID WC  . FLUoxetine  20 mg Oral Daily  . furosemide  40 mg Intravenous Q12H  . gabapentin  300 mg Oral BID  . heparin  5,000 Units Subcutaneous Q12H  . insulin aspart  0-15 Units Subcutaneous TID WC  . insulin aspart  0-5 Units Subcutaneous QHS  . insulin glargine  20 Units Subcutaneous QHS  . loratadine  10 mg Oral Daily  . magnesium oxide  400 mg Oral BID  . mometasone-formoterol  2 puff Inhalation BID  . pantoprazole  40 mg Oral Daily  . ranolazine  500 mg Oral BID  . sodium chloride flush  3 mL Intravenous Q12H  . tamsulosin  0.4 mg Oral QHS  . tiotropium  18 mcg Inhalation Daily   Continuous Infusions: PRN Meds:.acetaminophen, albuterol, alum & mag hydroxide-simeth, calcium carbonate, guaiFENesin, nitroGLYCERIN, ondansetron **OR** ondansetron (ZOFRAN) IV, traZODone Medications Prior to Admission:  Prior to Admission medications   Medication Sig Start Date End Date Taking? Authorizing Provider  acetaminophen (TYLENOL) 500 MG tablet Take 1,000 mg by mouth daily as needed for  moderate pain.   Yes [provider]  albuterol (PROVENTIL  HFA;VENTOLIN HFA) 108 (90 Base) MCG/ACT inhaler Inhale 2 puffs into the lungs every 6 (six) hours as needed for wheezing or shortness of breath. 06/04/18  Yes [name], [name], MD  allopurinol (ZYLOPRIM) 100 MG tablet Take 100 mg by mouth daily.   Yes [provider]  aspirin 81 MG chewable tablet Chew 1 tablet (81 mg total) by mouth daily. 09/06/18  Yes Mayo, Pete [name], MD  atorvastatin (LIPITOR) 80 MG tablet Take 1 tablet (80 mg total) by mouth every evening. 04/21/17  Yes Theodoro [name], MD  budesonide-formoterol (SYMBICORT) 80-4.5 MCG/ACT inhaler Inhale 2 puffs into the lungs 2 (two) times daily.   Yes [provider]  calcium carbonate (TUMS - DOSED IN MG ELEMENTAL CALCIUM) 500 MG chewable tablet Chew 1 tablet by mouth as needed for indigestion or heartburn.   Yes [provider]  carvedilol (COREG) 3.125 MG tablet Take 1 tablet (3.125 mg total) by mouth 2 (two) times daily with a meal. 01/01/18  Yes Gladstone [name], MD  clopidogrel (PLAVIX) 75 MG tablet Take 75 mg by mouth daily.   Yes [provider]  co-enzyme Q-10 30 MG capsule Take 30 mg by mouth daily.   Yes [provider]  ferrous sulfate 325 (65 FE) MG tablet Take 325 mg by mouth 2 (two) times daily with a meal.   Yes [provider]  FLUoxetine (PROZAC) 20 MG capsule Take 20 mg by mouth daily.   Yes [provider]  gabapentin (NEURONTIN) 300 MG capsule Take 300 mg by mouth 2 (two) times daily.    Yes [provider]  guaiFENesin (MUCINEX) 600 MG 12 hr tablet Take 600 mg by mouth 2 (two) times daily as needed.    Yes [provider]  insulin glargine (LANTUS) 100 UNIT/ML injection Inject 0.8 mLs (80 Units total) into the skin at bedtime. 12/12/18  Yes Vaughan Basta, MD  insulin lispro (HUMALOG) 100 UNIT/ML KiwkPen Inject 0-15 Units into the skin 3 (three) times daily with meals. Per  sliding scale   Yes [provider]  lansoprazole (PREVACID) 15 MG capsule Take 30 mg by mouth 2 (two) times daily.   Yes [provider]  loratadine (CLARITIN) 10 MG tablet Take 10 mg by mouth daily.   Yes [provider]  Magnesium Oxide 400 (240 Mg) MG TABS Take 1 tablet (400 mg total) by mouth 2 (two) times daily. 05/30/18  Yes Hackney, Otila Kluver A, FNP  Multiple Vitamins-Minerals (CENTRUM SILVER PO) Take 1 tablet by mouth every morning.   Yes [provider]  Multiple Vitamins-Minerals (PRESERVISION AREDS 2) CAPS Take 1 capsule by mouth 2 (two) times daily. For macular degeneration- eye vitamins   Yes [provider]  nitroGLYCERIN (NITROSTAT) 0.4 MG SL tablet Place 0.4 mg under the tongue every 5 (five) minutes as needed for chest pain.   Yes [provider]  ranolazine (RANEXA) 500 MG 12 hr tablet Take 1 tablet (500 mg total) by mouth 2 (two) times daily. 05/30/18  Yes Darylene Price A, FNP  tamsulosin (FLOMAX) 0.4 MG CAPS capsule Take 0.4 mg by mouth at bedtime.    Yes [provider]  tiotropium (SPIRIVA) 18 MCG inhalation capsule Place 1 capsule (18 mcg total) into inhaler and inhale daily. 06/08/18  Yes Loletha Grayer, MD   Allergies  Allergen Reactions  . Brilinta [Ticagrelor] Shortness Of Breath  . Tuberculin Tests Rash  . Benadryl [Diphenhydramine] Other (See Comments)    " Hyperactivity"  . Doxycycline Swelling  Pt went into pulmonary edema.  . Lopid [Gemfibrozil] Swelling    "I gain 1 pound a day for 30 days."   Review of Systems  Constitutional: Positive for fatigue.  All other systems reviewed and are negative.   Physical Exam Vitals signs and nursing note reviewed.  Constitutional:      General: He is awake.     Appearance: He is well-developed.     Comments: Chronically ill   Cardiovascular:     Rate and Rhythm: Normal rate and regular rhythm.     Pulses: Normal pulses.     Heart sounds: Normal heart  sounds.  Pulmonary:     Effort: Pulmonary effort is normal.     Breath sounds: Decreased breath sounds present.  Abdominal:     General: Bowel sounds are normal.     Palpations: Abdomen is soft.     Comments: Mild distention   Skin:    General: Skin is warm and dry.     Findings: Bruising and ecchymosis present.  Neurological:     Mental Status: He is alert and oriented to person, place, and time.  Psychiatric:        Attention and Perception: Attention normal.        Mood and Affect: Mood normal.        Speech: Speech normal.        Behavior: Behavior is cooperative.        Thought Content: Thought content normal.        Cognition and Memory: Cognition normal.        Judgment: Judgment normal.     Vital Signs: BP (!) 112/49 (BP Location: Left Arm)   Pulse 72   Temp 98 F (36.7 C) (Oral)   Resp 19   Ht _0  (1.702 m)   Wt 101.5 kg   SpO2 100%   BMI 35.05 kg/m  Pain Scale: 0-10   Pain Score: 0-No pain   SpO2: SpO2: 100 % O2 Device:SpO2: 100 % O2 Flow Rate: .O2 Flow Rate (L/min): 2 L/min  IO: Intake/output summary:   Intake/Output Summary (Last 24 hours) at 01/10/2019 1455 Last data filed at 01/10/2019 1407 Gross per 24 hour  Intake 240 ml  Output 2375 ml  Net -2135 ml    LBM: Last BM Date: 01/08/19 Baseline Weight: Weight: 107.4 kg Most recent weight: Weight: 101.5 kg     Palliative Assessment/Data: PPS 50%   Flowsheet Rows     Most Recent Value  Intake Tab  Referral Department  Hospitalist  Unit at Time of Referral  ER  Palliative Care Primary Diagnosis  Cardiac  Date Notified  01/09/19  Palliative Care Type  New Palliative care  Reason for referral  Clarify Goals of Care  Date of Admission  01/08/19  # of days IP prior to Palliative referral  1  Clinical Assessment  Psychosocial & Spiritual Assessment  Palliative Care Outcomes      Time In: 1230 Time Out: 1335 Time Total: 65 min  Greater than 50%  of this time was spent counseling and  coordinating care related to the above assessment and plan.  Signed by:  Alda Lea, AGPCNP-BC Palliative Medicine Team  Phone: [phone] Fax: [phone] Pager: [phone] Amion: Bjorn Pippin    Please contact Palliative Medicine Team phone at [phone] for questions and concerns.  For individual provider: See Shea Evans

## 2019-01-10 NOTE — Care Management (Signed)
CN consult for out patient palliative.  This RNCM notified Flo Shanks, RN of referral.

## 2019-01-10 NOTE — Progress Notes (Signed)
Cardiovascular and Pulmonary Nurse Navigator Note:    73 year old male with known history of anemia, cardiogenic pulmonary edema, ICM, CHF, COPD, DM, GERD, HLD, HTN, MI who presented to the ED with chest pain.  Troponin negative x 3.  BNP 460. CXR on admission: IMPRESSION: Mild interstitial opacities which may represent interstitial edema. Very small bilateral pleural effusions. Cardiomegaly.  Patient admitted with dx of chest pain, acute on chronic systolic CHF exacerbation with last EF of 15-20% (patient had ICD).     CHF Education:?? Rounded on patient.  Patient lying in bed with HOB elevated.  Wife at bedside.  Patient gave verbal permission for this RN to speak in front of his wife.   Educational session with patient completed.  Provided patient with "Living Better with Heart Failure" packet. Briefly reviewed definition of heart failure and signs and symptoms of an exacerbation.?Explained to patient that HF is a chronic illness which requires self-assessment / self-management along with help from the cardiologist/PCP.?? ? *Reviewed importance of and reason behind checking weight daily in the AM, after using the bathroom, but before getting dressed. Patient has scales and weighs himself daily.  Patient reported that he did not see an change in his weight.   ? *Reviewed with patient the following information: *Discussed when to call the Dr= weight gain of >2-3lb overnight of 5lb in a week,  *Discussed yellow zone= call MD: weight gain of >2-3lb overnight of 5lb in a week, increased swelling, increased SOB when lying down, chest discomfort, dizziness, increased fatigue *Red Zone= call 911: struggle to breath, fainting or near fainting, significant chest pain   Heart Failure Zone Magnet given to patient.    *Diet - Reviewed low sodium diet-provided handout of recommended and not recommended foods.?Dietitian Consultation for education completed yesterday.   ? *Discussed fluid intake with  patient as well. Patient not currently on a fluid restriction, but advised no more than 64 ounces of fluid per day.?Patient feels this is an area he could improve.   ? *Instructed patient to take medications as prescribed for heart failure. Explained briefly why pt is on the medications (either make you feel better, live longer or keep you out of the hospital) and discussed monitoring and side effects.  ? *Discussed exercise.  Patient informed this RN that he gets out of the house and drives his truck to [place] every morning to meet with friends.   NOTE:  Patient is not homebound and is not a candidate for [facility] RN / Heart failure protocol/HH PT.  Upon mentioning Cardiac Rehab to patient, patient immediately informed this RN that he cannot walk on the treadmill and that he is not interested in participating in Cardiac Rehab again.  Encouraged patient to be as active as possible.  ? *Smoking Cessation- Patient is a former smoker.? ? *[facility]  - Patient is an established patient in the [facility].  Patient has an appt scheduled for follow-up with Darylene Price, FNP in the [facility] upon discharge.   *Palliative Care Consult - Patient seen today.  When I informed patient since he is not homebound he is not a candidate for [facility] RN / HF Protocol, patient wanted to know if he is still a candidate for outpatient Palliative Care.   I informed him that he is indeed.   ? Again, the 5 Steps to Living Better with Heart Failure were reviewed with patient / wife.   ? Patient thanked me for providing  the above information.  Patient wanting to know if he is being discharged today.  This RN spoke with patient's assigned RN and Ripley Fraise, PA.  Patent to be discharged.   ? Roanna Epley, RN, BSN, [facility]? [place] [facility]?P[facility] Re[facility]  Cardiovascular &?Pulmonary Nurse Navigator  Direct Line: [phone]  Department Phone #: [phone] Fax: [phone]? Email Address:  [email or web address]

## 2019-01-10 NOTE — Progress Notes (Signed)
New referral for outpatient Palliative to follow at home received from [facility]. Patient to discharge home today. Patient information faxed to referral. Flo Shanks RN, BSN, [facility] and [facility] [place], hospital Liaison [phone]

## 2019-01-10 NOTE — Progress Notes (Signed)
Inpatient Diabetes Program Recommendations  AACE/ADA: New Consensus Statement on Inpatient Glycemic Control (2015)  Target Ranges:  Prepandial:   less than 140 mg/dL      Peak postprandial:   less than 180 mg/dL (1-2 hours)      Critically ill patients:  140 - 180 mg/dL   Lab Results  Component Value Date   GLUCAP 137 (H) 01/10/2019   HGBA1C 5.9 (H) 12/05/2017    Review of Glycemic Control Results for SAVANNAH, ERBE (MRN [id number]) as of 01/10/2019 11:37  Ref. Range 01/09/2019 07:31 01/09/2019 11:18 01/09/2019 16:37 01/09/2019 21:13 01/10/2019 07:25  Glucose-Capillary Latest Ref Range: 70 - 99 mg/dL 114 (H) 176 (H) 130 (H) 118 (H) 137 (H)  Diabetes history: DM 2 Outpatient Diabetes medications:  Lantus 55 units q HS, Humalog 0-15 units tid Current orders for Inpatient glycemic control:  Novolog moderate tid with meals and HS Lantus 20 units q HS  Inpatient Diabetes Program Recommendations:   Note that fasting CBG=137 mg/dL- Please reduce home dose of Lantus to 25 units q HS at d/c and have patient f/u with Dr. Gabriel Carina.    Thanks,  Adah Perl, RN, BC-ADM Inpatient Diabetes Coordinator Pager [phone] (8a-5p)

## 2019-01-10 NOTE — Plan of Care (Signed)
  Problem: Health Behavior/Discharge Planning: Goal: Ability to manage health-related needs will improve Outcome: Progressing Note:  Waiting for palliative care nurse to visit patient, then he will most likely d/c later in shift. Will continue to monitor consulting progress. Wenda Low [facility]

## 2019-01-11 ENCOUNTER — Other Ambulatory Visit: Payer: Self-pay

## 2019-01-11 NOTE — Patient Outreach (Signed)
Telephone assessment: Referral received on 01/10/2019  Admitted on 01/08/2019 Discharged on 01/10/2019 DX: heart failure  MD office does transition of care:  Placed call to patient with no answer. Voicemail full therefore unable to leave a message.   PLAN: will update assigned care manager. Will mail unsuccessful outreach letter. Next outreach in 3 business days.  Tomasa Rand, RN, BSN, CEN [facility] [employer] [phone]

## 2019-01-14 NOTE — Progress Notes (Signed)
Patient ID: [name], male    DOB: Jun 13, 1946, 73 y.o.   MRN: [id number]  HPI  [name] is [name] 73 y/o male with [name] history of prostate cancer, DM, hyperlipidemia, HTN, anemia, COPD, GERD, MI, previous tobacco use and chronic heart failure.   Echo report from 09/03/18 reviewed and showed an EF of 20% along with mild TR. Echo report from 02/18/18 reviewed and showed an EF of 20-25% along with trivial AR and moderate Daniel. Echo report from 12/05/17 reviewed and showed an EF of 20-25% along with mild Daniel and moderate Daniel.   Cardiac catheterization done 09/04/18 showed:  ejection fraction of 15% severe 3 vessel coronary artery disease There is occlusion of all native vessels at the ostium Patent graft to obtuse marginal 2 Patent graft to diagonal artery Patent graft to left anterior descending artery There is significant coronary artery disease and or stenosis involving the graft ostium to the right coronary  Stent placed to ostial SVG to RCA.   Cardiac catheterization done 02/02/16 showed severed 3-vessel CAD with patent grafts. Continue medication management along with dual antiplatelet therapy.   Admitted 01/08/2019 due to chest pain and acute on chronic HF. Cardiology and palliative care consults were obtained. Initially given IV lasix and then transitioned to oral diuretics. Discharged after 2 days. Was in the ED 12/28/2018 due to chest pain and HF exacerbation. Given IV diuretics and released. Admitted 12/09/2018 due to flu and pneumonia. Given IV steroids, antibiotics and tamiflu. Discharged after 3 days. Was in the ED 09/09/18 due to shortness of breath with [name] normal exam. Released same day. Admitted 09/02/18 due to chest pain along with HF exacerbation. Cardiology consult obtained. Catheterization/ stent done. Initially needed IV lasix and then transitioned to oral diuretics. Discharged after 3 days.    He presents today for [name] follow-up visit with [name] chief complaint of moderate fatigue upon  minimal exertion. He describes this as chronic in nature having been present for several years. He has associated cough, shortness of breath and leg pain along with this. He denies any difficulty sleeping, dizziness, abdominal distention, palpitations, pedal edema, chest pain, wheezing or weight gain.   Past Medical History:  Diagnosis Date  . Anemia   . Cancer ([place]) 12/2013   prostate  . Cardiogenic pulmonary edema ([place]) 12/19/2014  . Cardiomyopathy, ischemic   . CHF (congestive heart failure) ([place])   . COPD (chronic obstructive pulmonary disease) ([place])   . Depression   . Diabetes mellitus without complication ([place])   . GERD (gastroesophageal reflux disease)   . Gout   . Hypercholesteremia   . Hypertension   . Myocardial infarction ([place]) [id number]  . OSA on CPAP   . Shortness of breath dyspnea    Past Surgical History:  Procedure Laterality Date  . CARDIAC CATHETERIZATION N/[name] 02/02/2016   Procedure: Left Heart Cath and Coronary Angiography;  Surgeon: [name], [name];  Location: [facility];  Service: Cardiovascular;  Laterality: N/[name];  . CORONARY ANGIOPLASTY WITH STENT PLACEMENT    . CORONARY ARTERY BYPASS GRAFT  11/24/2010  . CORONARY STENT INTERVENTION N/[name] 09/04/2018   Procedure: CORONARY STENT INTERVENTION;  Surgeon: [name], [name];  Location: [facility];  Service: Cardiovascular;  Laterality: N/[name];  . IMPLANTABLE CARDIOVERTER DEFIBRILLATOR (ICD) GENERATOR CHANGE Left 12/12/2015   Procedure: DUAL LEAD PLACEMENT CARDIAC DIFIBRILLATOR;  Surgeon: [name], [name];  Location: [facility];  Service: Cardiovascular;  Laterality: Left;  . LEFT HEART CATH  AND CORS/GRAFTS ANGIOGRAPHY N/[name] 09/04/2018   Procedure: LEFT HEART CATH AND CORS/GRAFTS ANGIOGRAPHY;  Surgeon: [name], [name];  Location: [facility];  Service: Cardiovascular;  Laterality: N/[name];  . TONSILLECTOMY     Family History  Problem Relation Age of Onset  . CAD Mother   . Cancer  Mother   . Diabetes Mother   . Alzheimer's disease Father   . Cancer Father   . Heart disease Father    Social History   Tobacco Use  . Smoking status: Former Smoker    Packs/day: 2.00    Years: 30.00    Pack years: 60.00    Last attempt to quit: 12/03/1996    Years since quitting: 22.1  . Smokeless tobacco: Never Used  Substance Use Topics  . Alcohol use: No    Alcohol/week: 0.0 standard drinks   Allergies  Allergen Reactions  . Brilinta [Ticagrelor] Shortness Of Breath  . Tuberculin Tests Rash  . Benadryl [Diphenhydramine] Other (See Comments)    " Hyperactivity"  . Doxycycline Swelling    Pt went into pulmonary edema.  . Lopid [Gemfibrozil] Swelling    "I gain 1 pound [name] day for 30 days."   Prior to Admission medications   Medication Sig Start Date End Date Taking? Authorizing Provider  acetaminophen (TYLENOL) 500 MG tablet Take 1,000 mg by mouth daily as needed for moderate pain.   Yes Provider, Historical, [name]  albuterol (PROVENTIL HFA;VENTOLIN HFA) 108 (90 Base) MCG/ACT inhaler Inhale 2 puffs into the lungs every 6 (six) hours as needed for wheezing or shortness of breath. 06/04/18  Yes [name], [name], [name]  allopurinol (ZYLOPRIM) 100 MG tablet Take 100 mg by mouth daily.   Yes Provider, Historical, [name]  aspirin 81 MG chewable tablet Chew 1 tablet (81 mg total) by mouth daily. 09/06/18  Yes [name], Daniel [name], [name]  atorvastatin (LIPITOR) 80 MG tablet Take 1 tablet (80 mg total) by mouth every evening. 04/21/17  Yes Daniel [name], [name]  budesonide-formoterol (SYMBICORT) 80-4.5 MCG/ACT inhaler Inhale 2 puffs into the lungs 2 (two) times daily.   Yes Provider, Historical, [name]  calcium carbonate (TUMS - DOSED IN MG ELEMENTAL CALCIUM) 500 MG chewable tablet Chew 1 tablet by mouth as needed for indigestion or heartburn.   Yes Provider, Historical, [name]  carvedilol (COREG) 3.125 MG tablet Take 1 tablet (3.125 mg total) by mouth 2 (two) times daily with [name] meal. 01/01/18  Yes Daniel [name], [name]  clopidogrel (PLAVIX) 75 MG tablet Take 75 mg by mouth daily.   Yes Provider, Historical, [name]  co-enzyme Q-10 30 MG capsule Take 30 mg by mouth daily.   Yes Provider, Historical, [name]  ferrous sulfate 325 (65 FE) MG tablet Take 325 mg by mouth 2 (two) times daily with [name] meal.   Yes Provider, Historical, [name]  FLUoxetine (PROZAC) 20 MG capsule Take 20 mg by mouth daily.   Yes Provider, Historical, [name]  gabapentin (NEURONTIN) 300 MG capsule Take 300 mg by mouth 2 (two) times daily.    Yes Provider, Historical, [name]  insulin glargine (LANTUS) 100 UNIT/ML injection Inject 0.8 mLs (80 Units total) into the skin at bedtime. 12/12/18  Yes Daniel Basta, [name]  insulin lispro (HUMALOG) 100 UNIT/ML KiwkPen Inject 0-15 Units into the skin 3 (three) times daily with meals. Per sliding scale   Yes Provider, Historical, [name]  lansoprazole (PREVACID) 15 MG capsule Take 30 mg by mouth 2 (two) times daily.   Yes Provider, Historical, [name]  loratadine (  CLARITIN) 10 MG tablet Take 10 mg by mouth daily.   Yes Provider, Historical, [name]  Magnesium Oxide 400 (240 Mg) MG TABS Take 1 tablet (400 mg total) by mouth 2 (two) times daily. 05/30/18  Yes , Daniel Kluver [name], [name]  Multiple Vitamins-Minerals (CENTRUM SILVER PO) Take 1 tablet by mouth every morning.   Yes Provider, Historical, [name]  Multiple Vitamins-Minerals (PRESERVISION AREDS 2) CAPS Take 1 capsule by mouth 2 (two) times daily. For macular degeneration- eye vitamins   Yes Provider, Historical, [name]  nitroGLYCERIN (NITROSTAT) 0.4 MG SL tablet Place 0.4 mg under the tongue every 5 (five) minutes as needed for chest pain.   Yes Provider, Historical, [name]  ranolazine (RANEXA) 500 MG 12 hr tablet Take 1 tablet (500 mg total) by mouth 2 (two) times daily. 05/30/18  Yes , Daniel Kluver [name], [name]  spironolactone (ALDACTONE) 25 MG tablet Take 25 mg by mouth daily.   Yes Provider, Historical, [name]  tamsulosin (FLOMAX) 0.4 MG CAPS capsule Take 0.4 mg by mouth at bedtime.    Yes Provider,  Historical, [name]  tiotropium (SPIRIVA) 18 MCG inhalation capsule Place 1 capsule (18 mcg total) into inhaler and inhale daily. 06/08/18  Yes Loletha Grayer, [name]    Review of Systems  Constitutional: Positive for fatigue. Negative for appetite change.  HENT: Negative for congestion, postnasal drip, sore throat and voice change.   Eyes: Negative.   Respiratory: Positive for cough ("some") and shortness of breath. Negative for chest tightness and wheezing.   Cardiovascular: Negative for chest pain, palpitations and leg swelling.  Gastrointestinal: Negative for abdominal distention and abdominal pain.       Acid reflux pain worse  Endocrine: Negative.   Genitourinary: Negative.   Musculoskeletal: Positive for arthralgias (leg pain when walking long distances).  Skin: Negative.   Allergic/Immunologic: Negative.   Neurological: Negative for dizziness and light-headedness.  Hematological: Negative for adenopathy. Bruises/bleeds easily.  Psychiatric/Behavioral: Negative for dysphoric mood and sleep disturbance (sleeping well with CPAP and oxygen). The patient is not nervous/anxious.    Vitals:   01/16/19 1344  BP: (!) 113/34  Pulse: 83  Resp: 18  SpO2: 98%  Weight: 230 lb 2 oz (104.4 kg)  Height: 5\' 7"  (1.702 m)   Wt Readings from Last 3 Encounters:  01/16/19 230 lb 2 oz (104.4 kg)  01/10/19 223 lb 12.8 oz (101.5 kg)  01/03/19 236 lb 12.8 oz (107.4 kg)   Lab Results  Component Value Date   CREATININE 1.17 01/09/2019   CREATININE 1.12 01/08/2019   CREATININE 1.10 01/08/2019    Physical Exam  Constitutional: He is oriented to person, place, and time. He appears well-developed and well-nourished.  HENT:  Head: Normocephalic and atraumatic.  Neck: Normal range of motion. Neck supple. No JVD present.  Cardiovascular: Normal rate and regular rhythm.  Pulmonary/Chest: Effort normal. He has no wheezes. He has no rales.  Abdominal: He exhibits no distension. There is no abdominal  tenderness.  Musculoskeletal:        General: No tenderness or edema.  Neurological: He is alert and oriented to person, place, and time.  Skin: Skin is warm and dry.  Psychiatric: He has [name] normal mood and affect. His behavior is normal. Thought content normal.  Nursing note and vitals reviewed.  Assessment & Plan:  1: Chronic heart failure with reduced ejection fraction-  - NYHA class III - euvolemic today - has been weighing daily and home weight chart was reviewed. Reminded to call for an overnight weight  gain of 2 pounds or > or [name] weekly weight gain of >5 pounds - weight down 6 pounds from last visit here 2 weeks ago - not adding salt but recently had deviled egg sandwich and [name] cup of tomato soup; discussed sodium content of those foods - continues to drink mostly water - saw cardiology [name]) 09/19/18 - BNP from 01/07/2019 was 460.0 - consider trying entresto if BP remains good - titrate carvedilol if able at future visits - discussed paramedic program with patient and brochure was provided. They are going to think about it and will get back to me  2: HTN- - BP on the low side today - has had spironolactone recently resumed by PCP - saw PCP ([place]) 01/15/2019 - BMP from 01/09/2019 reviewed and showed sodium 140, potassium 3.9 creatinine 1.17 and GFR >60   3:Diabetes-  - A1c on 09/20/18 was 6.1% - saw endocrinologist ([place]) 09/27/18 - glucose at home today was 81  Patient's medication list was reviewed.  Return in 1 month or sooner for any questions/problems before then.

## 2019-01-15 ENCOUNTER — Other Ambulatory Visit: Payer: Self-pay | Admitting: Pharmacist

## 2019-01-15 DIAGNOSIS — I5023 Acute on chronic systolic (congestive) heart failure: Secondary | ICD-10-CM | POA: Diagnosis not present

## 2019-01-15 DIAGNOSIS — D638 Anemia in other chronic diseases classified elsewhere: Secondary | ICD-10-CM | POA: Diagnosis not present

## 2019-01-15 DIAGNOSIS — E782 Mixed hyperlipidemia: Secondary | ICD-10-CM | POA: Diagnosis not present

## 2019-01-15 NOTE — Patient Outreach (Signed)
[name]) Care Management  01/15/2019  [name] 22-Aug-1946 [id number]   73 year old male outreached by [facility] services for a 30 day post discharge medication review.  PMHx includes, but not limited to, hypertension, diabetes, GERD, COPD, anemia, hyperlipidemia, gout.   Unsuccessful outreach attempt #1. Mailbox full and unable to leave voicemail.   I will route note for [facility] Joetta Manners to make another outreach attempt to patient within 3-4 business days.   Gwenlyn Found, Sherian Rein D PGY1 Pharmacy Resident  Phone [phone] 01/15/2019   9:42 AM

## 2019-01-16 ENCOUNTER — Encounter: Payer: Self-pay | Admitting: Family

## 2019-01-16 ENCOUNTER — Encounter: Payer: Self-pay | Admitting: *Deleted

## 2019-01-16 ENCOUNTER — Ambulatory Visit: Payer: PPO | Attending: Family | Admitting: Family

## 2019-01-16 ENCOUNTER — Other Ambulatory Visit: Payer: Self-pay | Admitting: *Deleted

## 2019-01-16 VITALS — BP 113/34 | HR 83 | Resp 18 | Ht 67.0 in | Wt 230.1 lb

## 2019-01-16 DIAGNOSIS — Z87891 Personal history of nicotine dependence: Secondary | ICD-10-CM | POA: Insufficient documentation

## 2019-01-16 DIAGNOSIS — Z79899 Other long term (current) drug therapy: Secondary | ICD-10-CM | POA: Insufficient documentation

## 2019-01-16 DIAGNOSIS — J449 Chronic obstructive pulmonary disease, unspecified: Secondary | ICD-10-CM | POA: Diagnosis not present

## 2019-01-16 DIAGNOSIS — Z833 Family history of diabetes mellitus: Secondary | ICD-10-CM | POA: Insufficient documentation

## 2019-01-16 DIAGNOSIS — I255 Ischemic cardiomyopathy: Secondary | ICD-10-CM | POA: Diagnosis not present

## 2019-01-16 DIAGNOSIS — Z881 Allergy status to other antibiotic agents status: Secondary | ICD-10-CM | POA: Insufficient documentation

## 2019-01-16 DIAGNOSIS — I5022 Chronic systolic (congestive) heart failure: Secondary | ICD-10-CM

## 2019-01-16 DIAGNOSIS — E7800 Pure hypercholesterolemia, unspecified: Secondary | ICD-10-CM | POA: Insufficient documentation

## 2019-01-16 DIAGNOSIS — I252 Old myocardial infarction: Secondary | ICD-10-CM | POA: Diagnosis not present

## 2019-01-16 DIAGNOSIS — Z888 Allergy status to other drugs, medicaments and biological substances status: Secondary | ICD-10-CM | POA: Diagnosis not present

## 2019-01-16 DIAGNOSIS — K219 Gastro-esophageal reflux disease without esophagitis: Secondary | ICD-10-CM | POA: Insufficient documentation

## 2019-01-16 DIAGNOSIS — I110 Hypertensive heart disease with heart failure: Secondary | ICD-10-CM | POA: Insufficient documentation

## 2019-01-16 DIAGNOSIS — G4733 Obstructive sleep apnea (adult) (pediatric): Secondary | ICD-10-CM | POA: Diagnosis not present

## 2019-01-16 DIAGNOSIS — Z951 Presence of aortocoronary bypass graft: Secondary | ICD-10-CM | POA: Insufficient documentation

## 2019-01-16 DIAGNOSIS — D649 Anemia, unspecified: Secondary | ICD-10-CM | POA: Diagnosis not present

## 2019-01-16 DIAGNOSIS — Z794 Long term (current) use of insulin: Secondary | ICD-10-CM | POA: Diagnosis not present

## 2019-01-16 DIAGNOSIS — Z8546 Personal history of malignant neoplasm of prostate: Secondary | ICD-10-CM | POA: Insufficient documentation

## 2019-01-16 DIAGNOSIS — I2510 Atherosclerotic heart disease of native coronary artery without angina pectoris: Secondary | ICD-10-CM | POA: Diagnosis not present

## 2019-01-16 DIAGNOSIS — Z955 Presence of coronary angioplasty implant and graft: Secondary | ICD-10-CM | POA: Diagnosis not present

## 2019-01-16 DIAGNOSIS — M109 Gout, unspecified: Secondary | ICD-10-CM | POA: Diagnosis not present

## 2019-01-16 DIAGNOSIS — E119 Type 2 diabetes mellitus without complications: Secondary | ICD-10-CM | POA: Insufficient documentation

## 2019-01-16 DIAGNOSIS — E1143 Type 2 diabetes mellitus with diabetic autonomic (poly)neuropathy: Secondary | ICD-10-CM

## 2019-01-16 DIAGNOSIS — Z7982 Long term (current) use of aspirin: Secondary | ICD-10-CM | POA: Diagnosis not present

## 2019-01-16 DIAGNOSIS — I10 Essential (primary) hypertension: Secondary | ICD-10-CM

## 2019-01-16 DIAGNOSIS — Z8249 Family history of ischemic heart disease and other diseases of the circulatory system: Secondary | ICD-10-CM | POA: Diagnosis not present

## 2019-01-16 DIAGNOSIS — E785 Hyperlipidemia, unspecified: Secondary | ICD-10-CM | POA: Insufficient documentation

## 2019-01-16 DIAGNOSIS — R5383 Other fatigue: Secondary | ICD-10-CM | POA: Diagnosis present

## 2019-01-16 NOTE — Patient Instructions (Signed)
Continue weighing daily and call for an overnight weight gain of > 2 pounds or a weekly weight gain of >5 pounds. 

## 2019-01-16 NOTE — Patient Outreach (Signed)
[facility]) Care Management  01/16/2019  [name] June 10, 1946 [id number]   Initial telephone outreach  Transition of care by PCP office    Referral received : 01/10/2019 Referral source: [name] case manager Referral reason : Heart Failure    PMHx: includes but not limited to Heart failure, Diabetes, GERD, hypertension, MI , Anemia, COPD, hyperlipidemia, history of prostate cancer,    Successful outreach call to patient, explained reason for the call and [facility] care management services.   Patient further discussed recent hospital admissions related to heart failure.   Conditions  Heart failure, recent admission patient denies being short of breath or chest pain  on today, swelling he has not weighed this morning yet but reports weighing daily, weight on yesterday 225.6.  Discussed importance of tracking weights daily to identify symptoms of worsening of heart failure. Review of worsening symptoms , weight gain of 2 pounds in a day or 5 in a week, worsening shortness of breath, swelling in feet/ankles/abd. Patient has not weighed yet this morning reports just getting up and states that he usually weighs daily. Diabetes, monitors blood sugar daily and keeps a record. Patient reports having all supplies available , Has not checked yet this morning but reports his A1c has been good, last checked in October, and followed by [name] Patient lives at home with his wife, patient reports being independent with his daily care . Patient reports using a cane at times, when shopping he uses the grocery cart.  Patient has a supportive son, that is an EMT and lives nearby.   Advanced Directives Patient has a living will in place . Outpatient palliative care consult placed while inpatient, he is unsure if they have called due to multiple calls.   Medications  Patient reports having all medications on hand and taking as prescribed at discharge. Patient unable to review  medications at this time,he is preparing of MD office visit today.  Patient reports that he gets his medication through the [place] and [employer], denies cost concerns.  Appointments Patient has completed post discharge visit with PCP on 2/10 Patient has office visit at Heart failure clinic on 2/11  Patient has Cardiology follow up with [facility] on 2/12  Consent Patient is agreeable to [facility] care management services for education and support of chronic conditions. Reviewed program follow up and agreeable to initial home visit.   Patient denies any new concerns at this time.    Plan Patient agreeable to home visit , at time when son is available for continued education and support of chronic conditions of Heart Failure.  Will complete assessments and address  additional  care planning needs at next visit.  Will send PCP barrier letter.  Will schedule home visit in the next week.  Provided my contact information.    THN CM Care Plan Problem One     Most Recent Value  Care Plan Problem One  Risk for readmission related to recent hospitalization - Congestive Heart Failure   Role Documenting the Problem One  Care Management Flasher for Problem One  Active  THN Long Term Goal   Patient will be able to report no hospital readmission over the next 90 days    THN Long Term Goal Start Date  01/16/19  Interventions for Problem One Long Term Goal  Reviewed importance of notifying MD sooner of worsening symptoms, taking medications as prescribed, keeping all medical appointments.   THN CM Short Term Goal #  1   Over the next 30 days patient will be able to report weighing on daily basis and keeping a record.   THN CM Short Term Goal #1 Start Date  01/16/19  Interventions for Short Term Goal #1  Discussed with patient importance of tracking daily weights and keeping a record, advised best time of day to weigh , in am after going to the bathroom and before getting dressed.   THN CM Short  Term Goal #2   Over the next 30 days patient will be able to state at least 2 worsening symptoms of Heart failure in the yellow zone   [facility] CM Short Term Goal #2 Start Date  01/16/19  Interventions for Short Term Goal #2  Reviewed worsening symptoms in yellow zone of heart failure, weight gain of 2 pounds in a day, 5 in a week , increased swelling shortness of breath. Reviewed who to contact for symptoms and importance of timely follow up to avoid readmission.       [name], RN, [place] Management Coordinator  [phone]- Mobile [phone]- Toll Free Main Office

## 2019-01-17 ENCOUNTER — Ambulatory Visit: Payer: Self-pay

## 2019-01-17 ENCOUNTER — Encounter: Payer: Self-pay | Admitting: Family

## 2019-01-17 ENCOUNTER — Other Ambulatory Visit: Payer: Self-pay

## 2019-01-17 DIAGNOSIS — Z95810 Presence of automatic (implantable) cardiac defibrillator: Secondary | ICD-10-CM | POA: Diagnosis not present

## 2019-01-17 DIAGNOSIS — I5023 Acute on chronic systolic (congestive) heart failure: Secondary | ICD-10-CM | POA: Diagnosis not present

## 2019-01-17 DIAGNOSIS — I10 Essential (primary) hypertension: Secondary | ICD-10-CM | POA: Diagnosis not present

## 2019-01-17 DIAGNOSIS — I25810 Atherosclerosis of coronary artery bypass graft(s) without angina pectoris: Secondary | ICD-10-CM | POA: Diagnosis not present

## 2019-01-17 NOTE — Patient Outreach (Signed)
[facility]) Care Management  [place]  01/17/2019  [name] May 31, 1946 [id number]  Reason for call: post discharge medication review  Successful telephone call attempt # 1 to patient.  [name] is not at home and he requests that I call him back this afternoon.   Plan:  Outreach attempt this afternoon.   Joetta Manners, PharmD Clinical Pharmacist [place] [phone]  Addendum: Current insurance:[employer]  PMHx includes but not limited to:   Chronic heart failure, hypertension, coronary artery disease, h/o prostate cancer, COPD, diabetes and hyperlipidemia  Outreach:  Successful telephone call with [name].  HIPAA identifiers verified.   Subjective:  [name] reports that he is still not back to his "old self" and not eating much after having the flu and pneumonia in early January.  He states that his CBGs are running between 90-100 mg/dL.  He states that he saw cardiology today and no medication changes were made.   He is weighing daily and knows to all his doctor if he gains more than 2 lbs/day or 5 lbs/week.    Objective: Lab Results  Component Value Date   CREATININE 1.17 01/09/2019   CREATININE 1.12 01/08/2019   CREATININE 1.10 01/08/2019    Lab Results  Component Value Date   HGBA1C 5.9 (H) 12/05/2017    Lipid Panel     Component Value Date/Time   CHOL 140 01/05/2014 0219   TRIG 103 01/05/2014 0219   HDL 33 (L) 01/05/2014 0219   VLDL 21 01/05/2014 0219   LDLCALC 86 01/05/2014 0219    BP Readings from Last 3 Encounters:  01/16/19 (!) 113/34  01/10/19 (!) 112/49  01/03/19 (!) 115/59    Allergies  Allergen Reactions  . Brilinta [Ticagrelor] Shortness Of Breath  . Tuberculin Tests Rash  . Benadryl [Diphenhydramine] Other (See Comments)    " Hyperactivity"  . Doxycycline Swelling    Pt went into pulmonary edema.  . Lopid [Gemfibrozil] Swelling    "I gain 1  pound a day for 30 days."    Medications Reviewed Today    Reviewed by Dionne Milo, [facility] (Pharmacist) on 01/17/19 at [place] List Status: <None>  Medication Order Taking? Sig Documenting Provider Last Dose Status Informant  acetaminophen (TYLENOL) 500 MG tablet [id number] Yes Take 1,000 mg by mouth daily as needed for moderate pain. [provider] Taking Active Self  albuterol (PROVENTIL HFA;VENTOLIN HFA) 108 (90 Base) MCG/ACT inhaler [id number] Yes Inhale 2 puffs into the lungs every 6 (six) hours as needed for wheezing or shortness of breath. Alfred Levins, [name], MD Taking Active Self  allopurinol (ZYLOPRIM) 100 MG tablet [id number] Yes Take 100 mg by mouth daily. [provider] Taking Active Self  aspirin 81 MG chewable tablet [id number] Yes Chew 1 tablet (81 mg total) by mouth daily. Mayo, Pete [name], MD Taking Active Self  budesonide-formoterol [facility]) 80-4.5 MCG/ACT inhaler [id number] Yes Inhale 2 puffs into the lungs 2 (two) times daily. [provider] Taking Active Self  calcium carbonate (OS-CAL) 600 MG tablet [id number] Yes Take 600 mg by mouth 2 (two) times daily. [provider] Taking Active Self  calcium carbonate (TUMS - DOSED IN MG ELEMENTAL CALCIUM) 500 MG chewable tablet [id number] Yes Chew 1 tablet by mouth as needed for indigestion or heartburn. [provider] Taking Active Self           Med Note Cristie Hem   Tue Nov 22, 2017  3:35 PM)    carvedilol (COREG) 3.125 MG tablet [id number] Yes Take 1 tablet (3.125 mg total) by mouth 2 (two) times daily with a meal. Gladstone [name], MD Taking Active Self  clopidogrel (PLAVIX) 75 MG tablet [id number] Yes Take 75 mg by mouth daily. [provider] Taking Active Self  Co-Enzyme Q10 100 MG CAPS [id number] Yes Take 1 capsule by mouth daily. [provider] Taking Active Self  ferrous sulfate 325 (65 FE) MG tablet [id number] Yes Take 325 mg by mouth 2 (two) times  daily with a meal. [provider] Taking Active Self  finasteride (PROSCAR) 5 MG tablet [id number] Yes Take 5 mg by mouth daily. [provider] Taking Active Self  FLUoxetine (PROZAC) 20 MG capsule [id number] Yes Take 20 mg by mouth daily. [provider] Taking Active Self  gabapentin (NEURONTIN) 300 MG capsule [id number] Yes Take 300 mg by mouth 2 (two) times daily.  [provider] Taking Active Self  insulin glargine (LANTUS) 100 UNIT/ML injection [id number] Yes Inject 0.8 mLs (80 Units total) into the skin at bedtime.  Patient taking differently:  Inject 80 Units into the skin at bedtime. Uses 60 units at bedtime.   Vaughan Basta, MD Taking Active Self           Med Note (Janina Trafton, Donalynn Furlong   Wed Jan 17, 2019  3:49 PM)    insulin lispro (HUMALOG) 100 UNIT/ML KiwkPen [id number] Yes Inject 0-15 Units into the skin 3 (three) times daily with meals. Per sliding scale [provider] Taking Active Self  lansoprazole (PREVACID) 15 MG capsule [id number] Yes Take 30 mg by mouth 2 (two) times daily. [provider] Taking Active Self  loratadine (CLARITIN) 10 MG tablet [id number] Yes Take 10 mg by mouth daily. [provider] Taking Active Self  Magnesium Oxide 400 (240 Mg) MG TABS [id number] Yes Take 1 tablet (400 mg total) by mouth 2 (two) times daily. Alisa Graff, FNP Taking Active Self  Multiple Vitamins-Minerals (CENTRUM SILVER PO) [id number] Yes Take 1 tablet by mouth every morning. [provider] Taking Active Self  Multiple Vitamins-Minerals (PRESERVISION AREDS 2) CAPS [id number] Yes Take 1 capsule by mouth 2 (two) times daily. For macular degeneration- eye vitamins [provider] Taking Active Self  nitroGLYCERIN (NITROSTAT) 0.4 MG SL tablet [id number] No Place 0.4 mg under the tongue every 5 (five) minutes as needed for chest pain. [provider] Not Taking Active Self           Med Note  Zara Council, Denita Lung Dec 23, 2016  4:07 PM)    ranolazine (RANEXA) 500 MG 12 hr tablet [id number] Yes Take 1 tablet (500 mg total) by mouth 2 (two) times daily. Alisa Graff, FNP Taking Active Self  rosuvastatin (CRESTOR) 20 MG tablet [id number] Yes Take 20 mg by mouth daily. [provider] Taking Active Self  spironolactone (ALDACTONE) 25 MG tablet [id number] Yes Take 25 mg by mouth daily. [provider] Taking Active   tamsulosin (FLOMAX) 0.4 MG CAPS capsule [id number] Yes Take 0.4 mg by mouth at bedtime.  [provider] Taking Active Self  tiotropium (SPIRIVA) 18 MCG inhalation capsule [id number] Yes Place 1 capsule (18 mcg total) into inhaler and inhale daily. Loletha Grayer, MD Taking Active Self  torsemide (DEMADEX) 20 MG tablet [id number] Yes Take 20 mg by mouth once. [provider] Taking Active Self         ASSESSMENT: Date Discharged from Hospital: 01/10/19  Date Medication Reconciliation Performed: 01/17/2019  Discontinued at Discharge:   guaifenesin  Patient was recently discharged from hospital and all medications have been reviewed.  Assessment:  Drugs sorted by system:  Neurologic/Psychologic:fluoxetine  Cardiovascular: aspirin 81 mg, carvedilol, clopidogrel, nitroglycerine, ranolazine, rosuvastatin, torsemide  Pulmonary/Allergy:albuterrol MDI, budesonide/formoterol, loratadine, spironolactone, tiotropium   Gastrointestinal:lansoprazole  Endocrine: Lantus, Humalog  Pain: acetaminophen, gabapentin  Genitourinary:finasteride, tamsulosin  Vitamins/Minerals/Supplements:, calcium carbonate, co-enzyme Q-10, ferrous sulfate, magnesium oxide, MVI, Perservision Areds  Medication Review Findings:  . Lansoprazole may lead to reduced ability of clopidogrel to inhibit platelet aggregation and increase the risk of subsequent cardiovascular events.  . Per the Beers List, SSRIs have been identified as potentially inappropriate medications  to be used by patients >32 years old because of their association with falls and fractures.  . Per the Beers List, Proton Pump Inhibitors are identified as potentially inappropriate medications to be avoided as scheduled use for more than 8 weeks in patients >16 years old due to the risk of C. Difficile and bone losss/fracture.    Medication Assistance Findings:  No medication assistance needs identified.  He gets his medications through the [employer].  Plan: Will route note to Dr. Netty Starring.   Joetta Manners, PharmD Clinical Pharmacist [place] [phone]

## 2019-01-18 ENCOUNTER — Ambulatory Visit: Payer: PPO

## 2019-01-18 ENCOUNTER — Other Ambulatory Visit: Payer: Self-pay | Admitting: *Deleted

## 2019-01-18 NOTE — Patient Outreach (Signed)
Addendum: Dr. Raylene Miyamoto associate, Amy returned my call and advised that the referral had been faxed on Tuesday. I expressed my appreciation for the confirmation for palliative care consult.  [name]. Myrtie Neither, MSN, [facility] Gerontological Nurse Practitioner [facility] Care Management [phone]

## 2019-01-18 NOTE — Patient Outreach (Signed)
Care coordination requested by [facility] and [facility] Director to see if discharge order of a palliative care order was given by pt's primary care provider, Dr. Netty Starring.  [facility] team has acknowledged that pt has not received services to date.  [facility] did receive an advisory of pending order but has not received it to date.  Called Dr. Raylene Miyamoto office and talked with Lattie Haw, who took my message to request an order from Dr. Netty Starring for palliative care consult per discharge orders. Lattie Haw took my name and number for follow up if needed.  WIll advise team members of action [name], RN and Joetta Manners, PharmD).  Eulah Pont. Myrtie Neither, MSN, [facility] Gerontological Nurse Practitioner [facility] Care Management [phone]

## 2019-01-23 ENCOUNTER — Ambulatory Visit: Payer: Self-pay | Admitting: *Deleted

## 2019-01-23 ENCOUNTER — Encounter: Payer: Self-pay | Admitting: *Deleted

## 2019-01-23 NOTE — Patient Outreach (Addendum)
[facility]) Care Management   01/23/2019  [name] 1945/12/20 [id number]   Initial home visit.   [name] is an 73 y.o. male   Referral received : 01/10/2019 Referral source: [facility] case manager Referral reason : Heart Failure    PMHx: includes but not limited to Heart failure, Diabetes, GERD, hypertension, MI , Anemia, COPD, hyperlipidemia, history of prostate cancer, AICD placement  Subjective:  Patient reports feeling pretty good on today.  Patient discussed tolerating walking around [employer] today pushing a grocery cart while he waited on prescriptions.  Patient discussed contacting the [place] today about his scooter not working and has received return call from a business to bring in for repair . Patient son present during visit states he is planning a trip out of town on next week,  and reports that patient usually ends up in the hospital when he goes out of town, so he is going to keep a close eye on him in the next week.   Objective:  Patient sitting in recliner chair, BP 122/60 (BP Location: Right Arm, Patient Position: Sitting, Cuff Size: Large)   Pulse 80   Resp 20   Ht 1.702 m (5\' 7" )   Wt 224 lb 12.8 oz (102 kg)   SpO2 97%   BMI 35.21 kg/m  Review of Systems  Constitutional: Negative.   HENT: Negative.   Eyes: Negative.   Respiratory: Negative.   Gastrointestinal: Negative.   Genitourinary: Negative.   Skin: Negative.   Neurological: Negative.   Endo/Heme/Allergies: Negative.   Psychiatric/Behavioral: Negative.     Physical Exam  Constitutional: He is oriented to person, place, and time. He appears well-developed and well-nourished.  Cardiovascular: Normal rate, normal heart sounds and intact distal pulses.  Respiratory: Effort normal and breath sounds normal.  GI: Soft.  Neurological: He is alert and oriented to person, place, and time.  Skin: Skin is warm and dry.    Encounter Medications:   Outpatient Encounter  Medications as of 01/23/2019  Medication Sig  . acetaminophen (TYLENOL) 500 MG tablet Take 1,000 mg by mouth daily as needed for moderate pain.  Marland Kitchen albuterol (PROVENTIL HFA;VENTOLIN HFA) 108 (90 Base) MCG/ACT inhaler Inhale 2 puffs into the lungs every 6 (six) hours as needed for wheezing or shortness of breath.  . allopurinol (ZYLOPRIM) 100 MG tablet Take 100 mg by mouth daily.  Marland Kitchen aspirin 81 MG chewable tablet Chew 1 tablet (81 mg total) by mouth daily.  . budesonide-formoterol (SYMBICORT) 80-4.5 MCG/ACT inhaler Inhale 2 puffs into the lungs 2 (two) times daily.  . calcium carbonate (OS-CAL) 600 MG tablet Take 600 mg by mouth 2 (two) times daily.  . calcium carbonate (TUMS - DOSED IN MG ELEMENTAL CALCIUM) 500 MG chewable tablet Chew 1 tablet by mouth as needed for indigestion or heartburn.  . carvedilol (COREG) 3.125 MG tablet Take 1 tablet (3.125 mg total) by mouth 2 (two) times daily with a meal.  . clopidogrel (PLAVIX) 75 MG tablet Take 75 mg by mouth daily.  Marland Kitchen Co-Enzyme Q10 100 MG CAPS Take 1 capsule by mouth daily.  . ferrous sulfate 325 (65 FE) MG tablet Take 325 mg by mouth 2 (two) times daily with a meal.  . finasteride (PROSCAR) 5 MG tablet Take 5 mg by mouth daily.  Marland Kitchen FLUoxetine (PROZAC) 20 MG capsule Take 20 mg by mouth daily.  Marland Kitchen gabapentin (NEURONTIN) 300 MG capsule Take 300 mg by mouth 2 (two) times daily.   Marland Kitchen  insulin glargine (LANTUS) 100 UNIT/ML injection Inject 0.8 mLs (80 Units total) into the skin at bedtime. (Patient taking differently: Inject 80 Units into the skin at bedtime. Uses 60 units at bedtime.)  . insulin lispro (HUMALOG) 100 UNIT/ML KiwkPen Inject 0-15 Units into the skin 3 (three) times daily with meals. Per sliding scale  . lansoprazole (PREVACID) 15 MG capsule Take 30 mg by mouth 2 (two) times daily.  Marland Kitchen loratadine (CLARITIN) 10 MG tablet Take 10 mg by mouth daily.  . Magnesium Oxide 400 (240 Mg) MG TABS Take 1 tablet (400 mg total) by mouth 2 (two) times daily.  .  Multiple Vitamins-Minerals (CENTRUM SILVER PO) Take 1 tablet by mouth every morning.  . Multiple Vitamins-Minerals (PRESERVISION AREDS 2) CAPS Take 1 capsule by mouth 2 (two) times daily. For macular degeneration- eye vitamins  . nitroGLYCERIN (NITROSTAT) 0.4 MG SL tablet Place 0.4 mg under the tongue every 5 (five) minutes as needed for chest pain.  . ranolazine (RANEXA) 500 MG 12 hr tablet Take 1 tablet (500 mg total) by mouth 2 (two) times daily.  . rosuvastatin (CRESTOR) 20 MG tablet Take 20 mg by mouth daily.  Marland Kitchen spironolactone (ALDACTONE) 25 MG tablet Take 25 mg by mouth daily.  . tamsulosin (FLOMAX) 0.4 MG CAPS capsule Take 0.4 mg by mouth at bedtime.   Marland Kitchen tiotropium (SPIRIVA) 18 MCG inhalation capsule Place 1 capsule (18 mcg total) into inhaler and inhale daily.  Marland Kitchen torsemide (DEMADEX) 20 MG tablet Take 20 mg by mouth once.   No facility-administered encounter medications on file as of 01/23/2019.     Functional Status:   In your present state of health, do you have any difficulty performing the following activities: 01/23/2019 01/16/2019  Hearing? Y N  Vision? N N  Difficulty concentrating or making decisions? N N  Walking or climbing stairs? Y Y  Comment uses cane  -  Dressing or bathing? N N  Doing errands, shopping? Y N  Comment family assist  -  Conservation officer, nature and eating ? - -  Using the Toilet? - -  In the past six months, have you accidently leaked urine? - -  Do you have problems with loss of bowel control? - -  Managing your Medications? - -  Managing your Finances? - -  Housekeeping or managing your Housekeeping? - -  Comment - -  Some recent data might be hidden    Fall/Depression Screening:    Fall Risk  01/23/2019 01/16/2019 01/16/2019  Falls in the past year? 1 0 1  Number falls in past yr: 0 0 0  Injury with Fall? 0 0 0  Risk for fall due to : - - History of fall(s)  Risk for fall due to: Comment - - -  Follow up Falls prevention discussed - Falls prevention  discussed   PHQ 2/9 Scores 01/16/2019 09/11/2018 08/28/2018 07/27/2018 06/29/2018 05/30/2018 03/21/2018  PHQ - 2 Score 0 0 0 0 0 0 0  PHQ- 9 Score - - - - - - -    Assessment:  Initial home visit, patient wife, son and daughter present.    Heart failure  Patient weighing daily and keeping a record. Patient eats out at [facility] at least 6 days a week , eating sausage biscuits or gravy biscuit report that is usually his biggest meal .  Patient does not add salt to diet. Reviewed salt contents of usual breakfast item having at least 1200 mg of sodium, reviewed usual daily limit  of sodium of 2000 mg.  Reviewed Heart failure worsening symptoms, including zones. Patient able to state that he would call Otila Kluver at heart failure clinic if weight gain of 2 pounds in a day and 5 in a week, increased shortness of breath swelling, chest discomfort . Patient able to identify EF results of 15 20%.  Diabetes  Patient checking blood sugars about once daily and keeping a record.He reports taking sliding scale insulin about once daily  Denies recent low blood sugar episode of blood sugar less than 70  , able to state action plan of taking quick acting  sugar source and rechecking after 15 minute. Review of recent blood sugar range is 88-200. Recent A1c 6.1 on 09/20/18 .  Medications Patient reports that he is taking medication as prescribed, he discussed that [facility] pharmacist has reviewed his medications, he reports that he is on more than 15 medications and declined review of medications at visit.  Appointments  Patient has attended post discharge visit with PCP , Dr. Netty Starring,, [facility], cardiology and has next visit on 2/27 Dr.Solum, endocrinologist in April.  Palliative care  Palliative care consult placed during recent hospitalization and follow up call to palliative care by Deloria Lair , NP at [facility] after patient discharge home regarding palliative services and call to PCP to follow up on referral and verified  referral was faxed on 2/11 Patient discussed that he has not received call from palliative care services.  Placed call to Palliative care of [place] regarding referral, spoke with [facility] in referrals she reports that she has not referral, she states that she is able to see original hospital order but no referral order from PCP office. Alyse Low states that she will call PCP office today after this conversation and will return call to me.  Plan:  [facility] consent signed , welcome packet reviewed.  Provided Living with heart failure book and review, heart failure zones.  Will plan follow up call to palliative care regarding referral for services.  Will plan return call to patient in the next week.    THN CM Care Plan Problem One     Most Recent Value  Care Plan Problem One  Risk for readmission related to recent hospitalization - Congestive Heart Failure   Role Documenting the Problem One  Care Management [place] for Problem One  Active  THN Long Term Goal   Patient will be able to report no hospital readmission over the next 90 days    THN Long Term Goal Start Date  01/16/19  Interventions for Problem One Long Term Goal  Home visit completed, reviewed living with heart faillure and stressed the importance of knowing how he feels daily, continuing daily weights, notifying MD of worsening symptom sooner to avoid and admission .   THN CM Short Term Goal #1   Over the next 30 days patient will be able to report weighing on daily basis and keeping a record.   THN CM Short Term Goal #1 Start Date  01/16/19  Interventions for Short Term Goal #1  REviewed daily weigh log, provided [facility] calendar and review of how to use.   THN CM Short Term Goal #2   Over the next 30 days patient will be able to state at least 2 worsening symptoms of Heart failure in the yellow zone   [facility] CM Short Term Goal #2 Start Date  01/16/19  Interventions for Short Term Goal #2  provided Living with heart failure book  and review of heart failure zones and action plan   THN CM Short Term Goal #3  Patient will be able to discuss high salt foods to limit in diet over the next 30 days   THN CM Short Term Goal #3 Start Date  01/23/19  Interventions for Short Tern Goal #3  Reviewed high and low salt food choices on [facility] handout, reviewed label reading, review of daily salt limit based on 2000 mg.day . Reviewed salt content of foods that he eats frequently       [name], RN, [place] Management Coordinator  [phone]- Mobile [phone]- [place]

## 2019-01-23 NOTE — Patient Outreach (Deleted)
[facility]) Care Management   01/23/2019  [name] Mar 29, 1946 [id number]   Initial home visit.   [name] is an 73 y.o. male   Referral received : 01/10/2019 Referral source: [facility] case manager Referral reason : Heart Failure    PMHx: includes but not limited to Heart failure, Diabetes, GERD, hypertension, MI , Anemia, COPD, hyperlipidemia, history of prostate cancer, AICD placement  Subjective:  Patient reports feeling pretty good on today.  Patient discussed tolerating walking around [employer] today pushing a grocery cart while he waited on prescriptions.  Patient discussed contacting the [place] today about his scooter not working and has received return call from a business to bring in for repair . Patient son present during visit states he is planning a trip out of town on next week,  and reports that patient usually ends up in the hospital when he goes out of town, so he is going to keep a close eye on him in the next week.   Objective:  Patient sitting in recliner chair, BP 122/60 (BP Location: Right Arm, Patient Position: Sitting, Cuff Size: Large)   Pulse 80   Resp 20   Ht 1.702 m (5\' 7" )   Wt 224 lb 12.8 oz (102 kg)   SpO2 97%   BMI 35.21 kg/m  Review of Systems  Constitutional: Negative.   HENT: Negative.   Eyes: Negative.   Respiratory: Negative.   Gastrointestinal: Negative.   Genitourinary: Negative.   Skin: Negative.   Neurological: Negative.   Endo/Heme/Allergies: Negative.   Psychiatric/Behavioral: Negative.     Physical Exam  Constitutional: He is oriented to person, place, and time. He appears well-developed and well-nourished.  Cardiovascular: Normal rate, normal heart sounds and intact distal pulses.  Respiratory: Effort normal and breath sounds normal.  GI: Soft.  Neurological: He is alert and oriented to person, place, and time.  Skin: Skin is warm and dry.    Encounter Medications:   Outpatient Encounter  Medications as of 01/23/2019  Medication Sig  . acetaminophen (TYLENOL) 500 MG tablet Take 1,000 mg by mouth daily as needed for moderate pain.  Marland Kitchen albuterol (PROVENTIL HFA;VENTOLIN HFA) 108 (90 Base) MCG/ACT inhaler Inhale 2 puffs into the lungs every 6 (six) hours as needed for wheezing or shortness of breath.  . allopurinol (ZYLOPRIM) 100 MG tablet Take 100 mg by mouth daily.  Marland Kitchen aspirin 81 MG chewable tablet Chew 1 tablet (81 mg total) by mouth daily.  . budesonide-formoterol (SYMBICORT) 80-4.5 MCG/ACT inhaler Inhale 2 puffs into the lungs 2 (two) times daily.  . calcium carbonate (OS-CAL) 600 MG tablet Take 600 mg by mouth 2 (two) times daily.  . calcium carbonate (TUMS - DOSED IN MG ELEMENTAL CALCIUM) 500 MG chewable tablet Chew 1 tablet by mouth as needed for indigestion or heartburn.  . carvedilol (COREG) 3.125 MG tablet Take 1 tablet (3.125 mg total) by mouth 2 (two) times daily with a meal.  . clopidogrel (PLAVIX) 75 MG tablet Take 75 mg by mouth daily.  Marland Kitchen Co-Enzyme Q10 100 MG CAPS Take 1 capsule by mouth daily.  . ferrous sulfate 325 (65 FE) MG tablet Take 325 mg by mouth 2 (two) times daily with a meal.  . finasteride (PROSCAR) 5 MG tablet Take 5 mg by mouth daily.  Marland Kitchen FLUoxetine (PROZAC) 20 MG capsule Take 20 mg by mouth daily.  Marland Kitchen gabapentin (NEURONTIN) 300 MG capsule Take 300 mg by mouth 2 (two) times daily.   Marland Kitchen  insulin glargine (LANTUS) 100 UNIT/ML injection Inject 0.8 mLs (80 Units total) into the skin at bedtime. (Patient taking differently: Inject 80 Units into the skin at bedtime. Uses 60 units at bedtime.)  . insulin lispro (HUMALOG) 100 UNIT/ML KiwkPen Inject 0-15 Units into the skin 3 (three) times daily with meals. Per sliding scale  . lansoprazole (PREVACID) 15 MG capsule Take 30 mg by mouth 2 (two) times daily.  Marland Kitchen loratadine (CLARITIN) 10 MG tablet Take 10 mg by mouth daily.  . Magnesium Oxide 400 (240 Mg) MG TABS Take 1 tablet (400 mg total) by mouth 2 (two) times daily.  .  Multiple Vitamins-Minerals (CENTRUM SILVER PO) Take 1 tablet by mouth every morning.  . Multiple Vitamins-Minerals (PRESERVISION AREDS 2) CAPS Take 1 capsule by mouth 2 (two) times daily. For macular degeneration- eye vitamins  . nitroGLYCERIN (NITROSTAT) 0.4 MG SL tablet Place 0.4 mg under the tongue every 5 (five) minutes as needed for chest pain.  . ranolazine (RANEXA) 500 MG 12 hr tablet Take 1 tablet (500 mg total) by mouth 2 (two) times daily.  . rosuvastatin (CRESTOR) 20 MG tablet Take 20 mg by mouth daily.  Marland Kitchen spironolactone (ALDACTONE) 25 MG tablet Take 25 mg by mouth daily.  . tamsulosin (FLOMAX) 0.4 MG CAPS capsule Take 0.4 mg by mouth at bedtime.   Marland Kitchen tiotropium (SPIRIVA) 18 MCG inhalation capsule Place 1 capsule (18 mcg total) into inhaler and inhale daily.  Marland Kitchen torsemide (DEMADEX) 20 MG tablet Take 20 mg by mouth once.   No facility-administered encounter medications on file as of 01/23/2019.     Functional Status:   In your present state of health, do you have any difficulty performing the following activities: 01/23/2019 01/16/2019  Hearing? Y N  Vision? N N  Difficulty concentrating or making decisions? N N  Walking or climbing stairs? Y Y  Comment uses cane  -  Dressing or bathing? N N  Doing errands, shopping? Y N  Comment family assist  -  Conservation officer, nature and eating ? - -  Using the Toilet? - -  In the past six months, have you accidently leaked urine? - -  Do you have problems with loss of bowel control? - -  Managing your Medications? - -  Managing your Finances? - -  Housekeeping or managing your Housekeeping? - -  Comment - -  Some recent data might be hidden    Fall/Depression Screening:    Fall Risk  01/23/2019 01/16/2019 01/16/2019  Falls in the past year? 1 0 1  Number falls in past yr: 0 0 0  Injury with Fall? 0 0 0  Risk for fall due to : - - History of fall(s)  Risk for fall due to: Comment - - -  Follow up Falls prevention discussed - Falls prevention  discussed   PHQ 2/9 Scores 01/16/2019 09/11/2018 08/28/2018 07/27/2018 06/29/2018 05/30/2018 03/21/2018  PHQ - 2 Score 0 0 0 0 0 0 0  PHQ- 9 Score - - - - - - -    Assessment:  Initial home visit, patient wife, son and daughter present.    Heart failure  Patient weighing daily and keeping a record. Patient eats out at [facility] at least 6 days a week , eating sausage biscuits or gravy biscuit report that is usually his biggest meal .  Patient does not add salt to diet. Reviewed salt contents of usual breakfast item having at least 1200 mg of sodium, reviewed usual daily limit  of sodium of 2000 mg.  Reviewed Heart failure worsening symptoms, including zones. Patient able to state that he would call Otila Kluver at heart failure clinic if weight gain of 2 pounds in a day and 5 in a week, increased shortness of breath swelling, chest discomfort . Patient able to identify EF results of 15 20%.  Diabetes  Patient checking blood sugars about once daily and keeping a record.He reports taking sliding scale insulin about once daily  Denies recent low blood sugar episode of blood sugar less than 70  , able to state action plan of taking quick acting  sugar source and rechecking after 15 minute. Review of recent blood sugar range is 88-200. Recent A1c 6.1 on 09/20/18 .  Medications Patient reports that he is taking medication as prescribed, he discussed that [facility] pharmacist has reviewed his medications, he reports that he is on more than 15 medications and declined review of medications at visit.  Appointments  Patient has attended post discharge visit with PCP , Dr. Netty Starring,, [facility], cardiology and has next visit on 2/27 Dr.Solum, endocrinologist in April.  Palliative care  Palliative care consult placed during recent hospitalization and follow up call to palliative care by Deloria Lair , NP at [facility] after patient discharge home regarding palliative services and call to PCP to follow up on referral and verified  referral was faxed on 2/11 Patient discussed that he has not received call from palliative care services.  Placed call to [facility] [place] regarding referral, spoke with [facility] in referrals she reports that she has not referral, she states that she is able to see original hospital order but no referral order from PCP office. Alyse Low states that she will call PCP office today after this conversation and will return call to me.  Plan:  [facility] consent signed , welcome packet reviewed.  Provided Living with heart failure book and review, heart failure zones.  Will plan follow up call to palliative care regarding referral for services.  Will plan return call to patient in the next week.    THN CM Care Plan Problem One     Most Recent Value  Care Plan Problem One  Risk for readmission related to recent hospitalization - Congestive Heart Failure   Role Documenting the Problem One  Care Management [place] for Problem One  Active  THN Long Term Goal   Patient will be able to report no hospital readmission over the next 90 days    THN Long Term Goal Start Date  01/16/19  Interventions for Problem One Long Term Goal  Home visit completed, reviewed living with heart faillure and stressed the importance of knowing how he feels daily, continuing daily weights, notifying MD of worsening symptom sooner to avoid and admission .   THN CM Short Term Goal #1   Over the next 30 days patient will be able to report weighing on daily basis and keeping a record.   THN CM Short Term Goal #1 Start Date  01/16/19  Interventions for Short Term Goal #1  REviewed daily weigh log, provided [facility] calendar and review of how to use.   THN CM Short Term Goal #2   Over the next 30 days patient will be able to state at least 2 worsening symptoms of Heart failure in the yellow zone   [facility] CM Short Term Goal #2 Start Date  01/16/19  Interventions for Short Term Goal #2  provided Living with heart failure book  and review of heart failure zones and action plan   THN CM Short Term Goal #3  Patient will be able to discuss high salt foods to limit in diet over the next 30 days   THN CM Short Term Goal #3 Start Date  01/23/19  Interventions for Short Tern Goal #3  Reviewed high and low salt food choices on [facility] handout, reviewed label reading, review of daily salt limit based on 2000 mg.day . Reviewed salt content of foods that he eats frequently       [name], RN, [place] Management Coordinator  [phone]- Mobile [phone]- [place]

## 2019-01-25 ENCOUNTER — Other Ambulatory Visit: Payer: Self-pay | Admitting: *Deleted

## 2019-01-25 NOTE — Patient Outreach (Signed)
[facility]) Care Management  01/25/2019  [name] 1946-08-25 [id number]   Care coordination call  Placed call to [facility] ( [facility] [place] ), spoke with Langley Gauss in referrals, she was able to confirm that they have received order for palliative care services from PCP office. She states that they are processing order and once completed the patient will receive a phone call.  Plan  Placed call to patient to update to anticipate receiving a call from [facility] [place]  Will plan follow up call in the next week.    [name], RN, [facility] Management Coordinator  [phone]- Mobile [phone]- Toll Free Main Office

## 2019-02-01 ENCOUNTER — Other Ambulatory Visit: Payer: Self-pay | Admitting: *Deleted

## 2019-02-01 DIAGNOSIS — I5022 Chronic systolic (congestive) heart failure: Secondary | ICD-10-CM | POA: Diagnosis not present

## 2019-02-01 NOTE — Patient Outreach (Addendum)
[facility]) Care Management  02/01/2019  MARCIANO MUNDT 1946/05/28 [id number]  Telephone follow up   Unsuccessful telephone outreach call to patient , no answer able to leave a HIPAA compliant message for return call .   1630 Received return call from patient son [name], he discussed that patient was doing pretty good.  He reports that patient is working on limiting salt in diet, he has reduced the number of days that he goes to [employer]. He discussed patient is working on walking more, tolerating walking while shopping and not getting as winded.  He reports patient has not experienced a sudden increase in weight of 2 pounds in a day ore 5 in a week, he is unable to recall recent weight has he is not at home with his log.  Patient son discussed Palliative care has called and scheduled initial home visit in the next 2 weeks.   Patient son reports that patient had visit a cardiology office to check out his defibrillator reports they noted something in evaluation asking patient if he has had any dizzy episodes  but did not elaborate, patient has follow up visit in the next 2 weeks with [facility].   Patient son denied any new concerns at this time    Plan Will plan follow up call in the next month.  Reinforced worsening symptoms of heart failure to notify MD of to avoid readmission.     [name], RN, [place] Management Coordinator  [phone]- Mobile [phone]- Toll Free Main Office

## 2019-02-05 ENCOUNTER — Ambulatory Visit: Payer: PPO | Admitting: *Deleted

## 2019-02-05 ENCOUNTER — Other Ambulatory Visit: Payer: PPO | Admitting: Student

## 2019-02-05 DIAGNOSIS — Z515 Encounter for palliative care: Secondary | ICD-10-CM

## 2019-02-05 NOTE — Progress Notes (Signed)
[facility] Consult Note Telephone: [phone]  Fax: [phone]  PATIENT NAME: [name] DOB: [birth year] MRN: [id number]  PRIMARY CARE PROVIDER:   [name], MD  REFERRING PROVIDER:  [name], MD [facility] [facility] [place], [place] [address]  RESPONSIBLE PARTY: Self  ASSESSMENT: [name] is sitting in chair upon arrival, no acute distress noted. Wife [name] and son [name] are present. We discussed role of Palliative Medicine. We discussed goals of care; [name] would like to limit hospitalizations. We discussed disease processes and symptom management. We discussed code status; he would like to remain a Full Code. Copy of MOST form reviewed; left a copy for patient and family to discuss further. [name] and family were given opportunity to ask questions.      RECOMMENDATIONS and PLAN:  1. Code status: Full Code. 2. Medical goals of therapy-to limit hospitalizations. Continue to follow up at Heart Failure clinic as scheduled. Palliative Medicine will provide ongoing support and make recommendations as needed.  3. Symptom management: dyspnea- continue spiriva, symbicort as ordered, continue torsemide 20mg  BID. He is encouraged to use prn albuterol inhaler 2 puffs every 6 hours prn for shortness of breath/wheezing. Continue CPAP with oxygen at night. Chest pain-continue nitroglycerin 0.4mg  prn. Weight: continue torsemide 20mg  BID, continue daily weights, monitor sodium intake, limit to 2000mg  daily, fluid intake limit to 1.5 liters as previously ordered.  I spent 75 minutes providing this consultation,  from 3:00pm to 4:15pm. More than 50% of the time in this consultation was spent coordinating communication.   HISTORY OF PRESENT ILLNESS:  [name] is a 73 y.o.  male with multiple medical problems including chronic systolic heart failure, cardiomyopathy, myocardial  infarction, hypertension, hypercholesterolemia, copd, diabetes, anemia, prostate cancer, depression, GERD, gout, macular degeneration, obstructive sleep apnea, on CPAP. [name] was hospitalized 2/3 to 01/10/2019 for acute on chronic congestive heart failure, EF 20%. He reports having 3 hospitalizations since January 2020. Palliative Care was asked to help address goals of care. He currently resides at home with his wife [name] and his son [name] lives nearby and takes patient to appointments and brings meals. He reports chest pain; takes nitroglycerin with effectiveness. He has peripheral neuropathy, denies pain otherwise. He reports shortness of breath at rest and with exertion. He has prn albuterol inhaler; uses "occasionally." He is taking symbicort and spiriva as ordered. He is taking torsemide 20mg  BID. He denies edema to lower extremities; does report "fullness" around abdomen. He is weighing himself daily; today he was 223 pounds. His appetite is good; he has been cutting back portions. [name] and son have been watching labels to monitor for sodium intake; currently on 2000mg  sodium limit per day. He is also on 1.5 L fluid restriction. He had been eating [employer] biscuit each morning; he still visits, but has cut back on eating these. He has been eating oatmeal for breakfast. His blood sugars have been "in the low 100's." Blood sugar was 107mg /dL this morning; his lantus had been decreased to 65 units at night. He is able to perform adl's with assistance. He sometimes uses a walking stick. He has been trying to walk more. He [place] in the past two months. He is resting well at night; he sleeps on two pillows and head of his bed is raised 5.5 inches. He is awaiting device to arrive to check his pacemaker in the home. He  has upcoming appointment with Darylene Price at [facility] clinic on 02/14/2019 and appointment with Dr. Nehemiah Massed on 02/19/2019. [name] CPAP is through  [facility] [place]. He would like to see if he can get an E-tank for emergency situations.   CODE STATUS: Full Code  PPS: 50% HOSPICE ELIGIBILITY/DIAGNOSIS: TBD  PAST MEDICAL HISTORY:  Past Medical History:  Diagnosis Date  . Anemia   . Cancer ([place]) 12/2013   prostate  . Cardiogenic pulmonary edema ([place]) 12/19/2014  . Cardiomyopathy, ischemic   . CHF (congestive heart failure) ([place])   . COPD (chronic obstructive pulmonary disease) ([place])   . Depression   . Diabetes mellitus without complication ([place])   . GERD (gastroesophageal reflux disease)   . Gout   . Hypercholesteremia   . Hypertension   . Myocardial infarction ([place]) [id number]  . OSA on CPAP   . Shortness of breath dyspnea     SOCIAL HX:  Social History   Tobacco Use  . Smoking status: Former Smoker    Packs/day: 2.00    Years: 30.00    Pack years: 60.00    Last attempt to quit: 12/03/1996    Years since quitting: 22.1  . Smokeless tobacco: Never Used  Substance Use Topics  . Alcohol use: No    Alcohol/week: 0.0 standard drinks    ALLERGIES:  Allergies  Allergen Reactions  . Brilinta [Ticagrelor] Shortness Of Breath  . Tuberculin Tests Rash  . Benadryl [Diphenhydramine] Other (See Comments)    " Hyperactivity"  . Doxycycline Swelling    Pt went into pulmonary edema.  . Lopid [Gemfibrozil] Swelling    "I gain 1 pound a day for 30 days."     PERTINENT MEDICATIONS:  Outpatient Encounter Medications as of 02/05/2019  Medication Sig  . acetaminophen (TYLENOL) 500 MG tablet Take 1,000 mg by mouth daily as needed for moderate pain.  Marland Kitchen albuterol (PROVENTIL HFA;VENTOLIN HFA) 108 (90 Base) MCG/ACT inhaler Inhale 2 puffs into the lungs every 6 (six) hours as needed for wheezing or shortness of breath.  . allopurinol (ZYLOPRIM) 100 MG tablet Take 100 mg by mouth daily.  Marland Kitchen aspirin 81 MG chewable tablet Chew 1 tablet (81 mg total) by mouth daily.  . budesonide-formoterol (SYMBICORT) 80-4.5 MCG/ACT inhaler Inhale 2 puffs  into the lungs 2 (two) times daily.  . calcium carbonate (OS-CAL) 600 MG tablet Take 600 mg by mouth 2 (two) times daily.  . calcium carbonate (TUMS - DOSED IN MG ELEMENTAL CALCIUM) 500 MG chewable tablet Chew 1 tablet by mouth as needed for indigestion or heartburn.  . carvedilol (COREG) 3.125 MG tablet Take 1 tablet (3.125 mg total) by mouth 2 (two) times daily with a meal.  . clopidogrel (PLAVIX) 75 MG tablet Take 75 mg by mouth daily.  Marland Kitchen Co-Enzyme Q10 100 MG CAPS Take 1 capsule by mouth daily.  . ferrous sulfate 325 (65 FE) MG tablet Take 325 mg by mouth 2 (two) times daily with a meal.  . finasteride (PROSCAR) 5 MG tablet Take 5 mg by mouth daily.  Marland Kitchen FLUoxetine (PROZAC) 20 MG capsule Take 20 mg by mouth daily.  Marland Kitchen gabapentin (NEURONTIN) 300 MG capsule Take 300 mg by mouth 2 (two) times daily.   . insulin glargine (LANTUS) 100 UNIT/ML injection Inject 0.8 mLs (80 Units total) into the skin at bedtime. (Patient taking differently: Inject 80 Units into the skin at bedtime. Uses 65 units at bedtime.)  . insulin lispro (HUMALOG) 100 UNIT/ML KiwkPen Inject 0-15 Units into the skin  3 (three) times daily with meals. Per sliding scale  . lansoprazole (PREVACID) 15 MG capsule Take 30 mg by mouth 2 (two) times daily.  Marland Kitchen loratadine (CLARITIN) 10 MG tablet Take 10 mg by mouth daily.  . Magnesium Oxide 400 (240 Mg) MG TABS Take 1 tablet (400 mg total) by mouth 2 (two) times daily.  . Multiple Vitamins-Minerals (CENTRUM SILVER PO) Take 1 tablet by mouth every morning.  . Multiple Vitamins-Minerals (PRESERVISION AREDS 2) CAPS Take 1 capsule by mouth 2 (two) times daily. For macular degeneration- eye vitamins  . nitroGLYCERIN (NITROSTAT) 0.4 MG SL tablet Place 0.4 mg under the tongue every 5 (five) minutes as needed for chest pain.  . ranolazine (RANEXA) 500 MG 12 hr tablet Take 1 tablet (500 mg total) by mouth 2 (two) times daily.  . rosuvastatin (CRESTOR) 20 MG tablet Take 20 mg by mouth daily.  .  tamsulosin (FLOMAX) 0.4 MG CAPS capsule Take 0.4 mg by mouth at bedtime.   Marland Kitchen tiotropium (SPIRIVA) 18 MCG inhalation capsule Place 1 capsule (18 mcg total) into inhaler and inhale daily.  Marland Kitchen torsemide (DEMADEX) 20 MG tablet Take 20 mg by mouth 2 (two) times daily.   . [DISCONTINUED] spironolactone (ALDACTONE) 25 MG tablet Take 25 mg by mouth daily.   No facility-administered encounter medications on file as of 02/05/2019.     PHYSICAL EXAM:   General: NAD Cardiovascular: regular rate and rhythm Pulmonary: clear ant fields Abdomen: soft, nontender, + bowel sounds GU: no suprapubic tenderness Extremities: no edema, no joint deformities Skin: no rashes Neurological: Weakness but otherwise nonfocal  Ezekiel Slocumb, NP

## 2019-02-07 DIAGNOSIS — S60221A Contusion of right hand, initial encounter: Secondary | ICD-10-CM | POA: Diagnosis not present

## 2019-02-08 NOTE — Progress Notes (Signed)
Patient ID: [name], male    DOB: August 18, 1946, 73 y.o.   MRN: [id number]  HPI  [name] is a 73 y/o male with a history of prostate cancer, DM, hyperlipidemia, HTN, anemia, COPD, GERD, MI, previous tobacco use and chronic heart failure.   Echo report from 09/03/18 reviewed and showed an EF of 20% along with mild TR. Echo report from 02/18/18 reviewed and showed an EF of 20-25% along with trivial AR and moderate Daniel. Echo report from 12/05/17 reviewed and showed an EF of 20-25% along with mild Daniel and moderate Daniel.   Cardiac catheterization done 09/04/18 showed:  ejection fraction of 15% severe 3 vessel coronary artery disease There is occlusion of all native vessels at the ostium Patent graft to obtuse marginal 2 Patent graft to diagonal artery Patent graft to left anterior descending artery There is significant coronary artery disease and or stenosis involving the graft ostium to the right coronary  Stent placed to ostial SVG to RCA.   Cardiac catheterization done 02/02/16 showed severed 3-vessel CAD with patent grafts. Continue medication management along with dual antiplatelet therapy.   Admitted 01/08/2019 due to chest pain and acute on chronic HF. Cardiology and palliative care consults were obtained. Initially given IV lasix and then transitioned to oral diuretics. Discharged after 2 days. Was in the ED 12/28/2018 due to chest pain and HF exacerbation. Given IV diuretics and released. Admitted 12/09/2018 due to flu and pneumonia. Given IV steroids, antibiotics and tamiflu. Discharged after 3 days. Was in the ED 09/09/18 due to shortness of breath with a normal exam. Released same day. Admitted 09/02/18 due to chest pain along with HF exacerbation. Cardiology consult obtained. Catheterization/ stent done. Initially needed IV lasix and then transitioned to oral diuretics. Discharged after 3 days.    He presents today for a follow-up visit with a chief complaint of moderate shortness of  breath upon minimal exertion. He describes this as chronic in nature having been present for several years. He has associated fatigue, cough and easy bruising along with this. He denies any difficulty sleeping, dizziness, abdominal distention, palpitations, pedal edema, chest pain, wheezing or weight gain. Is going to be starting "some program" through [facility] where they have someone that could come see him 24/7 for any issues.   Past Medical History:  Diagnosis Date  . Anemia   . Cancer ([place]) 12/2013   prostate  . Cardiogenic pulmonary edema ([place]) 12/19/2014  . Cardiomyopathy, ischemic   . CHF (congestive heart failure) ([place])   . COPD (chronic obstructive pulmonary disease) ([place])   . Depression   . Diabetes mellitus without complication ([place])   . GERD (gastroesophageal reflux disease)   . Gout   . Hypercholesteremia   . Hypertension   . Myocardial infarction ([place]) [id number]  . OSA on CPAP   . Shortness of breath dyspnea    Past Surgical History:  Procedure Laterality Date  . CARDIAC CATHETERIZATION N/A 02/02/2016   Procedure: Left Heart Cath and Coronary Angiography;  Surgeon: [name], MD;  Location: [facility];  Service: Cardiovascular;  Laterality: N/A;  . CORONARY ANGIOPLASTY WITH STENT PLACEMENT    . CORONARY ARTERY BYPASS GRAFT  11/24/2010  . CORONARY STENT INTERVENTION N/A 09/04/2018   Procedure: CORONARY STENT INTERVENTION;  Surgeon: [name], MD;  Location: [facility];  Service: Cardiovascular;  Laterality: N/A;  . IMPLANTABLE CARDIOVERTER DEFIBRILLATOR (ICD) GENERATOR CHANGE Left 12/12/2015   Procedure: DUAL LEAD  PLACEMENT CARDIAC DIFIBRILLATOR;  Surgeon: [name], MD;  Location: [facility];  Service: Cardiovascular;  Laterality: Left;  . LEFT HEART CATH AND CORS/GRAFTS ANGIOGRAPHY N/A 09/04/2018   Procedure: LEFT HEART CATH AND CORS/GRAFTS ANGIOGRAPHY;  Surgeon: [name], MD;  Location: [facility];   Service: Cardiovascular;  Laterality: N/A;  . TONSILLECTOMY     Family History  Problem Relation Age of Onset  . CAD Mother   . Cancer Mother   . Diabetes Mother   . Alzheimer's disease Father   . Cancer Father   . Heart disease Father    Social History   Tobacco Use  . Smoking status: Former Smoker    Packs/day: 2.00    Years: 30.00    Pack years: 60.00    Last attempt to quit: 12/03/1996    Years since quitting: 22.1  . Smokeless tobacco: Never Used  Substance Use Topics  . Alcohol use: No    Alcohol/week: 0.0 standard drinks   Allergies  Allergen Reactions  . Brilinta [Ticagrelor] Shortness Of Breath  . Tuberculin Tests Rash  . Benadryl [Diphenhydramine] Other (See Comments)    " Hyperactivity"  . Doxycycline Swelling    Pt went into pulmonary edema.  . Lopid [Gemfibrozil] Swelling    "I gain 1 pound a day for 30 days."   Prior to Admission medications   Medication Sig Start Date End Date Taking? Authorizing Provider  acetaminophen (TYLENOL) 500 MG tablet Take 1,000 mg by mouth daily as needed for moderate pain.   Yes [provider]  albuterol (PROVENTIL HFA;VENTOLIN HFA) 108 (90 Base) MCG/ACT inhaler Inhale 2 puffs into the lungs every 6 (six) hours as needed for wheezing or shortness of breath. 06/04/18  Yes [name], [name], MD  allopurinol (ZYLOPRIM) 100 MG tablet Take 100 mg by mouth daily.   Yes [provider]  aspirin 81 MG chewable tablet Chew 1 tablet (81 mg total) by mouth daily. 09/06/18  Yes Mayo, Pete [name], MD  budesonide-formoterol [facility]) 80-4.5 MCG/ACT inhaler Inhale 2 puffs into the lungs 2 (two) times daily.   Yes [provider]  calcium carbonate (OS-CAL) 600 MG tablet Take 600 mg by mouth 2 (two) times daily.   Yes [provider]  calcium carbonate (TUMS - DOSED IN MG ELEMENTAL CALCIUM) 500 MG chewable tablet Chew 1 tablet by mouth as needed for indigestion or heartburn.   Yes [provider]   carvedilol (COREG) 3.125 MG tablet Take 1 tablet (3.125 mg total) by mouth 2 (two) times daily with a meal. 01/01/18  Yes Gladstone [name], MD  clopidogrel (PLAVIX) 75 MG tablet Take 75 mg by mouth daily.   Yes [provider]  Co-Enzyme Q10 100 MG CAPS Take 1 capsule by mouth daily.   Yes [provider]  ferrous sulfate 325 (65 FE) MG tablet Take 325 mg by mouth 2 (two) times daily with a meal.   Yes [provider]  finasteride (PROSCAR) 5 MG tablet Take 5 mg by mouth daily.   Yes [provider]  FLUoxetine (PROZAC) 20 MG capsule Take 20 mg by mouth daily.   Yes [provider]  gabapentin (NEURONTIN) 300 MG capsule Take 300 mg by mouth 2 (two) times daily.    Yes [provider]  insulin glargine (LANTUS) 100 UNIT/ML injection Inject 0.8 mLs (80 Units total) into the skin at bedtime. Patient taking differently: Inject 60 Units into the skin at bedtime.  12/12/18  Yes  Vaughan Basta, MD  insulin lispro (HUMALOG) 100 UNIT/ML KiwkPen Inject 0-15 Units into the skin 3 (three) times daily with meals. About 25-30 units total for the day   Yes [provider]  lansoprazole (PREVACID) 15 MG capsule Take 30 mg by mouth 2 (two) times daily.   Yes [provider]  loratadine (CLARITIN) 10 MG tablet Take 10 mg by mouth daily.   Yes [provider]  Magnesium Oxide 400 (240 Mg) MG TABS Take 1 tablet (400 mg total) by mouth 2 (two) times daily. 05/30/18  Yes Aryahi Denzler, Otila Kluver A, FNP  Multiple Vitamins-Minerals (CENTRUM SILVER PO) Take 1 tablet by mouth every morning.   Yes [provider]  Multiple Vitamins-Minerals (PRESERVISION AREDS 2) CAPS Take 1 capsule by mouth 2 (two) times daily. For macular degeneration- eye vitamins   Yes [provider]  nitroGLYCERIN (NITROSTAT) 0.4 MG SL tablet Place 0.4 mg under the tongue every 5 (five) minutes as needed for chest pain.   Yes [provider]   ranolazine (RANEXA) 500 MG 12 hr tablet Take 1 tablet (500 mg total) by mouth 2 (two) times daily. 05/30/18  Yes [place] Wherry, Otila Kluver A, FNP  rosuvastatin (CRESTOR) 20 MG tablet Take 20 mg by mouth daily.   Yes [provider]  tamsulosin (FLOMAX) 0.4 MG CAPS capsule Take 0.4 mg by mouth at bedtime.    Yes [provider]  tiotropium (SPIRIVA) 18 MCG inhalation capsule Place 1 capsule (18 mcg total) into inhaler and inhale daily. 06/08/18  Yes Wieting, Richard, MD  torsemide (DEMADEX) 20 MG tablet Take 20 mg by mouth 2 (two) times daily.    Yes [provider]    Review of Systems  Constitutional: Positive for fatigue. Negative for appetite change.  HENT: Negative for congestion, postnasal drip, sore throat and voice change.   Eyes: Negative.   Respiratory: Positive for cough ("some") and shortness of breath (with minimal exertion). Negative for chest tightness and wheezing.   Cardiovascular: Negative for chest pain, palpitations and leg swelling.  Gastrointestinal: Negative for abdominal distention and abdominal pain.  Endocrine: Negative.   Genitourinary: Negative.   Musculoskeletal: Positive for arthralgias (leg pain when walking long distances).  Skin: Negative.   Allergic/Immunologic: Negative.   Neurological: Negative for dizziness and light-headedness.  Hematological: Negative for adenopathy. Bruises/bleeds easily.  Psychiatric/Behavioral: Negative for dysphoric mood and sleep disturbance (sleeping well with CPAP and oxygen). The patient is not nervous/anxious.    Vitals:   02/12/19 1341  BP: (!) 124/33  Pulse: 81  Resp: 18  SpO2: 100%  Weight: 231 lb 6 oz (105 kg)  Height: 5\' 7"  (1.702 m)   Wt Readings from Last 3 Encounters:  02/12/19 231 lb 6 oz (105 kg)  02/05/19 223 lb (101.2 kg)  01/23/19 224 lb 12.8 oz (102 kg)   Lab Results  Component Value Date   CREATININE 1.17 01/09/2019   CREATININE 1.12 01/08/2019   CREATININE 1.10 01/08/2019     Physical Exam  Constitutional: He is oriented to person, place, and time. He appears well-developed and well-nourished.  HENT:  Head: Normocephalic and atraumatic.  Neck: Normal range of motion. Neck supple. No JVD present.  Cardiovascular: Normal rate and regular rhythm.  Pulmonary/Chest: Effort normal. He has no wheezes. He has no rales.  Abdominal: He exhibits no distension. There is no abdominal tenderness.  Musculoskeletal:        General: No tenderness or edema.  Neurological: He is alert and oriented to person,  place, and time.  Skin: Skin is warm and dry.  Psychiatric: He has a normal mood and affect. His behavior is normal. Thought content normal.  Nursing note and vitals reviewed.  Assessment & Plan:  1: Chronic heart failure with reduced ejection fraction-  - NYHA class III - euvolemic today - has been weighing daily and home weight chart was reviewed. Weight at home ranges from 223-225 lbs. Reminded to call for an overnight weight gain of 2 pounds or > or a weekly weight gain of >5 pounds - weight up 1.5 pounds from last visit here 1 month ago - not adding salt but recently had [employer] chicken sandwich yesterday and last week he ate at [employer] - says that he's aware that eating out is going to give him more sodium than if he eats at home - continues to drink mostly water - saw cardiology Nehemiah Massed) 01/17/2019 - BNP from 01/07/2019 was 460.0 - doubtful BP would tolerate entresto - titrate carvedilol if able at future visits - palliative care consult done 02/05/2019 - PharmD reconciled medications with the patient - he is going to bring by the brochure from [facility] so we can make a copy of the information. If they are going to have someone available 24/7 for the patient, that might be a better option than the paramedic program.    2: HTN- - BP low (124/33) - saw PCP ([place]) 02/07/2019 - BMP from 01/09/2019 reviewed and showed sodium  140, potassium 3.9 creatinine 1.17 and GFR >60 - wearing CPAP nightly   3:Diabetes-  - A1c on 09/20/18 was 6.1% - saw endocrinologist ([place]) 09/27/18 - glucose at home today was 106  Patient's medication list was reviewed.  Return in 5 weeks or sooner for any questions/problems before then.

## 2019-02-09 ENCOUNTER — Telehealth: Payer: Self-pay | Admitting: Student

## 2019-02-09 ENCOUNTER — Telehealth: Payer: Self-pay

## 2019-02-09 NOTE — Telephone Encounter (Signed)
NP called [facility] (ext [phone]), home oxygen department. Spoke with Gerald Stabs to request E-tanks/portable tanks in the home. He will order them and patient should have them delivered early part of next week, maybe Monday.

## 2019-02-09 NOTE — Telephone Encounter (Signed)
Series of telephone calls to [place] and patient's wife to discuss getting E tanks in the home. Learned that due to the fact that patient is a veteran and the [place] is paying for his oxygen, [facility] reported they needed order to come from the [place] requesting this equipment and then they could deliver. Telephone call to [facility], NP informing of the above and she agrees to contact [facility] to request this order.

## 2019-02-09 NOTE — Telephone Encounter (Signed)
[name] was notified that request had been made to the [place] for E-tanks/portable oxygen. They should be delivered early part of next week, maybe Monday.

## 2019-02-12 ENCOUNTER — Encounter: Payer: Self-pay | Admitting: Family

## 2019-02-12 ENCOUNTER — Ambulatory Visit: Payer: PPO | Attending: Family | Admitting: Family

## 2019-02-12 ENCOUNTER — Encounter: Payer: Self-pay | Admitting: Pharmacist

## 2019-02-12 VITALS — BP 124/33 | HR 81 | Resp 18 | Ht 67.0 in | Wt 231.4 lb

## 2019-02-12 DIAGNOSIS — Z79899 Other long term (current) drug therapy: Secondary | ICD-10-CM | POA: Insufficient documentation

## 2019-02-12 DIAGNOSIS — E7800 Pure hypercholesterolemia, unspecified: Secondary | ICD-10-CM | POA: Diagnosis not present

## 2019-02-12 DIAGNOSIS — M109 Gout, unspecified: Secondary | ICD-10-CM | POA: Insufficient documentation

## 2019-02-12 DIAGNOSIS — I252 Old myocardial infarction: Secondary | ICD-10-CM | POA: Insufficient documentation

## 2019-02-12 DIAGNOSIS — R0602 Shortness of breath: Secondary | ICD-10-CM | POA: Diagnosis present

## 2019-02-12 DIAGNOSIS — I110 Hypertensive heart disease with heart failure: Secondary | ICD-10-CM | POA: Insufficient documentation

## 2019-02-12 DIAGNOSIS — E119 Type 2 diabetes mellitus without complications: Secondary | ICD-10-CM | POA: Insufficient documentation

## 2019-02-12 DIAGNOSIS — E785 Hyperlipidemia, unspecified: Secondary | ICD-10-CM | POA: Insufficient documentation

## 2019-02-12 DIAGNOSIS — I5022 Chronic systolic (congestive) heart failure: Secondary | ICD-10-CM | POA: Insufficient documentation

## 2019-02-12 DIAGNOSIS — Z8249 Family history of ischemic heart disease and other diseases of the circulatory system: Secondary | ICD-10-CM | POA: Diagnosis not present

## 2019-02-12 DIAGNOSIS — Z7902 Long term (current) use of antithrombotics/antiplatelets: Secondary | ICD-10-CM | POA: Diagnosis not present

## 2019-02-12 DIAGNOSIS — I255 Ischemic cardiomyopathy: Secondary | ICD-10-CM | POA: Insufficient documentation

## 2019-02-12 DIAGNOSIS — Z794 Long term (current) use of insulin: Secondary | ICD-10-CM | POA: Insufficient documentation

## 2019-02-12 DIAGNOSIS — F329 Major depressive disorder, single episode, unspecified: Secondary | ICD-10-CM | POA: Insufficient documentation

## 2019-02-12 DIAGNOSIS — J449 Chronic obstructive pulmonary disease, unspecified: Secondary | ICD-10-CM | POA: Insufficient documentation

## 2019-02-12 DIAGNOSIS — K219 Gastro-esophageal reflux disease without esophagitis: Secondary | ICD-10-CM | POA: Diagnosis not present

## 2019-02-12 DIAGNOSIS — I2510 Atherosclerotic heart disease of native coronary artery without angina pectoris: Secondary | ICD-10-CM | POA: Insufficient documentation

## 2019-02-12 DIAGNOSIS — Z87891 Personal history of nicotine dependence: Secondary | ICD-10-CM | POA: Insufficient documentation

## 2019-02-12 DIAGNOSIS — E1143 Type 2 diabetes mellitus with diabetic autonomic (poly)neuropathy: Secondary | ICD-10-CM

## 2019-02-12 DIAGNOSIS — R079 Chest pain, unspecified: Secondary | ICD-10-CM | POA: Insufficient documentation

## 2019-02-12 DIAGNOSIS — G4733 Obstructive sleep apnea (adult) (pediatric): Secondary | ICD-10-CM | POA: Diagnosis not present

## 2019-02-12 DIAGNOSIS — Z955 Presence of coronary angioplasty implant and graft: Secondary | ICD-10-CM | POA: Insufficient documentation

## 2019-02-12 DIAGNOSIS — I10 Essential (primary) hypertension: Secondary | ICD-10-CM

## 2019-02-12 DIAGNOSIS — Z7901 Long term (current) use of anticoagulants: Secondary | ICD-10-CM | POA: Diagnosis not present

## 2019-02-12 DIAGNOSIS — Z951 Presence of aortocoronary bypass graft: Secondary | ICD-10-CM | POA: Insufficient documentation

## 2019-02-12 DIAGNOSIS — Z7982 Long term (current) use of aspirin: Secondary | ICD-10-CM | POA: Diagnosis not present

## 2019-02-12 DIAGNOSIS — D649 Anemia, unspecified: Secondary | ICD-10-CM | POA: Diagnosis not present

## 2019-02-12 NOTE — Progress Notes (Signed)
[facility] - PHARMACIST COUNSELING NOTE  ADHERENCE ASSESSMENT  Adherence strategy: has a very thought out process for organizing his medications on his dresser   Do you ever forget to take your medication? [] Yes (1) [x] No (0)  Do you ever skip doses due to side effects? [] Yes (1) [x] No (0)  Do you have trouble affording your medicines? [] Yes (1) [x] No (0)  Are you ever unable to pick up your medication due to transportation difficulties? [] Yes (1) [x] No (0)  Do you ever stop taking your medications because you don't believe they are helping? [] Yes (1) [x] No (0)  Total score _0______    Recommendations given to patient about increasing adherence: None. Patient reports good compliance with his current regimen and adherence strategy.  Guideline-Directed Medical Therapy/Evidence Based Medicine    ACE/ARB/ARNI: none   Beta Blocker: carvedilol 3.125 mg twice daily   Aldosterone Antagonist: none Diuretic: torsemide 20 mg twice daily    SUBJECTIVE   HPI: Here for follow up visit. Weighing daily with no significant weight gain. Denies any issues with his medications. Knowledgeable about his current medication regimen.  Past Medical History:  Diagnosis Date  . Anemia   . Cancer ([place]) 12/2013   prostate  . Cardiogenic pulmonary edema ([place]) 12/19/2014  . Cardiomyopathy, ischemic   . CHF (congestive heart failure) ([place])   . COPD (chronic obstructive pulmonary disease) ([place])   . Depression   . Diabetes mellitus without complication ([place])   . GERD (gastroesophageal reflux disease)   . Gout   . Hypercholesteremia   . Hypertension   . Myocardial infarction ([place]) [id number]  . OSA on CPAP   . Shortness of breath dyspnea         OBJECTIVE    Vital signs: HR 81, BP 124/33, weight (pounds) 231.6  ECHO: Date 09/03/18, EF 20%, notes: mild TR  Cath: Date 09/04/18, EF 15%  BMP Latest Ref Rng & Units 01/09/2019 01/08/2019 01/08/2019  Glucose  70 - 99 mg/dL 135(H) - 110(H)  BUN 8 - 23 mg/dL 15 - 14  Creatinine 0.61 - 1.24 mg/dL 1.17 1.12 1.10  Sodium 135 - 145 mmol/L 140 - 140  Potassium 3.5 - 5.1 mmol/L 3.9 - 3.7  Chloride 98 - 111 mmol/L 105 - 105  CO2 22 - 32 mmol/L 28 - 28  Calcium 8.9 - 10.3 mg/dL 8.5(L) - 8.4(L)    ASSESSMENT 73 year old male with HFrEF. Reports good compliance with medication regimen. Weighing himself daily and recording weights in a log. Has not had any abnormal weight gain. Endorses shortness of breath all the time but denies any swelling.   PLAN Optimization of HF regimen limited by BP. Patient's EF would make him a good candidate for Entresto, but suspect BP will not allow. May consider titration of carvedilol or addition of low-dose ACE inhibitor/ARB if BP allows at future visits.    Time spent: 10 minutes  [place], Pharm.D. 02/12/2019 2:20 PM    Current Outpatient Medications:  .  acetaminophen (TYLENOL) 500 MG tablet, Take 1,000 mg by mouth daily as needed for moderate pain., Disp: , Rfl:  .  albuterol (PROVENTIL HFA;VENTOLIN HFA) 108 (90 Base) MCG/ACT inhaler, Inhale 2 puffs into the lungs every 6 (six) hours as needed for wheezing or shortness of breath., Disp: 1 Inhaler, Rfl: 2 .  allopurinol (ZYLOPRIM) 100 MG tablet, Take 100 mg by mouth daily., Disp: , Rfl:  .  aspirin 81 MG chewable tablet,  Chew 1 tablet (81 mg total) by mouth daily., Disp: 30 tablet, Rfl: 0 .  budesonide-formoterol (SYMBICORT) 80-4.5 MCG/ACT inhaler, Inhale 2 puffs into the lungs 2 (two) times daily., Disp: , Rfl:  .  calcium carbonate (OS-CAL) 600 MG tablet, Take 600 mg by mouth 2 (two) times daily., Disp: , Rfl:  .  calcium carbonate (TUMS - DOSED IN MG ELEMENTAL CALCIUM) 500 MG chewable tablet, Chew 1 tablet by mouth as needed for indigestion or heartburn., Disp: , Rfl:  .  carvedilol (COREG) 3.125 MG tablet, Take 1 tablet (3.125 mg total) by mouth 2 (two) times daily with a meal., Disp: 60 tablet, Rfl: 2 .   clopidogrel (PLAVIX) 75 MG tablet, Take 75 mg by mouth daily., Disp: , Rfl:  .  Co-Enzyme Q10 100 MG CAPS, Take 1 capsule by mouth daily., Disp: , Rfl:  .  ferrous sulfate 325 (65 FE) MG tablet, Take 325 mg by mouth 2 (two) times daily with a meal., Disp: , Rfl:  .  finasteride (PROSCAR) 5 MG tablet, Take 5 mg by mouth daily., Disp: , Rfl:  .  FLUoxetine (PROZAC) 20 MG capsule, Take 20 mg by mouth daily., Disp: , Rfl:  .  gabapentin (NEURONTIN) 300 MG capsule, Take 300 mg by mouth 2 (two) times daily. , Disp: , Rfl:  .  insulin glargine (LANTUS) 100 UNIT/ML injection, Inject 0.8 mLs (80 Units total) into the skin at bedtime. (Patient taking differently: Inject 60 Units into the skin at bedtime. ), Disp: 10 mL, Rfl: 11 .  insulin lispro (HUMALOG) 100 UNIT/ML KiwkPen, Inject 0-15 Units into the skin 3 (three) times daily with meals. About 25-30 units total for the day, Disp: , Rfl:  .  lansoprazole (PREVACID) 15 MG capsule, Take 30 mg by mouth 2 (two) times daily., Disp: , Rfl:  .  loratadine (CLARITIN) 10 MG tablet, Take 10 mg by mouth daily., Disp: , Rfl:  .  Magnesium Oxide 400 (240 Mg) MG TABS, Take 1 tablet (400 mg total) by mouth 2 (two) times daily., Disp: 90 tablet, Rfl: 3 .  Multiple Vitamins-Minerals (CENTRUM SILVER PO), Take 1 tablet by mouth every morning., Disp: , Rfl:  .  Multiple Vitamins-Minerals (PRESERVISION AREDS 2) CAPS, Take 1 capsule by mouth 2 (two) times daily. For macular degeneration- eye vitamins, Disp: , Rfl:  .  nitroGLYCERIN (NITROSTAT) 0.4 MG SL tablet, Place 0.4 mg under the tongue every 5 (five) minutes as needed for chest pain., Disp: , Rfl:  .  ranolazine (RANEXA) 500 MG 12 hr tablet, Take 1 tablet (500 mg total) by mouth 2 (two) times daily., Disp: 180 tablet, Rfl: 3 .  rosuvastatin (CRESTOR) 20 MG tablet, Take 20 mg by mouth daily., Disp: , Rfl:  .  tamsulosin (FLOMAX) 0.4 MG CAPS capsule, Take 0.4 mg by mouth at bedtime. , Disp: , Rfl:  .  tiotropium (SPIRIVA) 18  MCG inhalation capsule, Place 1 capsule (18 mcg total) into inhaler and inhale daily., Disp: 30 capsule, Rfl: 0 .  torsemide (DEMADEX) 20 MG tablet, Take 20 mg by mouth 2 (two) times daily. , Disp: , Rfl:    COUNSELING POINTS/CLINICAL PEARLS Carvedilol (Goal: weight less than 85 kg is 25 mg BID, weight greater than 85 kg is 50 mg BID)  Patient should avoid activities requiring coordination until drug effects are realized, as drug may cause dizziness.  This drug may cause diarrhea, nausea, vomiting, arthralgia, back pain, myalgia, headache, vision disorder, erectile dysfunction, reduced libido, or  fatigue.  Instruct patient to report signs/symptoms of adverse cardiovascular effects such as hypotension (especially in elderly patients), arrhythmias, syncope, palpitations, angina, or edema.  Drug may mask symptoms of hypoglycemia. Advise diabetic patients to carefully monitor blood sugar levels.  Patient should take drug with food.  Advise patient against sudden discontinuation of drug. Torsemide  Side effects may include excessive urination.  Tell patient to report symptoms of ototoxicity.  Instruct patient to report lightheadedness or syncope.  Warn patient to avoid use of nonprescription NSAID products without first discussing it with their healthcare provider.   DRUGS TO AVOID IN HEART FAILURE  Drug or Class Mechanism  Analgesics . NSAIDs . COX-2 inhibitors . Glucocorticoids  Sodium and water retention, increased systemic vascular resistance, decreased response to diuretics   Diabetes Medications . Metformin . Thiazolidinediones o Rosiglitazone (Avandia) o Pioglitazone (Actos) . DPP4 Inhibitors o Saxagliptin (Onglyza) o Sitagliptin (Januvia)   Lactic acidosis Possible calcium channel blockade   Unknown  Antiarrhythmics . Class I  o Flecainide o Disopyramide . Class III o Sotalol . Other o Dronedarone  Negative inotrope, proarrhythmic   Proarrhythmic, beta  blockade  Negative inotrope  Antihypertensives . Alpha Blockers o Doxazosin . Calcium Channel Blockers o Diltiazem o Verapamil o Nifedipine . Central Alpha Adrenergics o Moxonidine . Peripheral Vasodilators o Minoxidil  Increases renin and aldosterone  Negative inotrope    Possible sympathetic withdrawal  Unknown  Anti-infective . Itraconazole . Amphotericin B  Negative inotrope Unknown  Hematologic . Anagrelide . Cilostazol   Possible inhibition of PD IV Inhibition of PD III causing arrhythmias  Neurologic/Psychiatric . Stimulants . Anti-Seizure Drugs o Carbamazepine o Pregabalin . Antidepressants o Tricyclics o Citalopram . Parkinsons o Bromocriptine o Pergolide o Pramipexole . Antipsychotics o Clozapine . Antimigraine o Ergotamine o Methysergide . Appetite suppressants . Bipolar o Lithium  Peripheral alpha and beta agonist activity  Negative inotrope and chronotrope Calcium channel blockade  Negative inotrope, proarrhythmic Dose-dependent QT prolongation  Excessive serotonin activity/valvular damage Excessive serotonin activity/valvular damage Unknown  IgE mediated hypersensitivy, calcium channel blockade  Excessive serotonin activity/valvular damage Excessive serotonin activity/valvular damage Valvular damage  Direct myofibrillar degeneration, adrenergic stimulation  Antimalarials . Chloroquine . Hydroxychloroquine Intracellular inhibition of lysosomal enzymes  Urologic Agents . Alpha Blockers o Doxazosin o Prazosin o Tamsulosin o Terazosin  Increased renin and aldosterone  Adapted from Page RL, et al. "Drugs That May Cause or Exacerbate Heart Failure: A Scientific Statement from the [place]." Circulation 2016; 106:Y69-S85. DOI: 10.1161/CIR.0000000000000426   MEDICATION ADHERENCES TIPS AND STRATEGIES 1. Taking medication as prescribed improves patient outcomes in heart failure (reduces hospitalizations,  improves symptoms, increases survival) 2. Side effects of medications can be managed by decreasing doses, switching agents, stopping drugs, or adding additional therapy. Please let someone in the [facility] know if you have having bothersome side effects so we can modify your regimen. Do not alter your medication regimen without talking to [place].  3. Medication reminders can help patients remember to take drugs on time. If you are missing or forgetting doses you can try linking behaviors, using pill boxes, or an electronic reminder like an alarm on your phone or an app. Some people can also get automated phone calls as medication reminders.

## 2019-02-12 NOTE — Patient Instructions (Signed)
Continue weighing daily and call for an overnight weight gain of > 2 pounds or a weekly weight gain of >5 pounds. 

## 2019-02-14 ENCOUNTER — Ambulatory Visit: Payer: PPO | Admitting: Family

## 2019-02-14 ENCOUNTER — Other Ambulatory Visit: Payer: Self-pay | Admitting: *Deleted

## 2019-02-14 NOTE — Patient Outreach (Signed)
[facility]) Care Management  02/14/2019  CHOU BUSLER 07/21/1946 [id number]   Telephone follow up call   Referral received : 01/10/2019 Referral source: [facility] case manager Referral reason : Heart Failure    PMHx: includes but not limited to Heart failure, Diabetes, GERD, hypertension, MI , Anemia, COPD, hyperlipidemia, history of prostate cancer,  Successful outreach to patient discussed doing pretty good so far.  Patient reports visit to heart failure clinic recently no new changes. Patient denies any sudden weight gain or increase in shortness of breath or swelling. Patient reports weight today was 225 and he has remained in that range over the last week. Patient reports that he continues to work on limiting salt in his diet having oatmeal at times when in goes out to [place] instead of a sausage biscuit.     Patient discussed having visit with palliative care NP and she is working on plans to get him a portable oxygen tank, they have already communicated with the [facility] that supplies his concentrator at home.   Patient discussed receiving a letter from [facility], regarding the 24/7 care service they provide, he discussed that he is interested in the service and plans to return the letter that they sent him.   Discussed with patient that once he is active with [facility] service of care management that I will close to [facility] care management patient voiced understanding.    Plan Will plan return call regarding initiation of [facility] program and closure [facility] care management services in the next 2 weeks    [name], RN, [place] Management Coordinator  [phone] Mobile [phone]- [place]

## 2019-02-15 ENCOUNTER — Other Ambulatory Visit: Payer: Self-pay | Admitting: *Deleted

## 2019-02-15 NOTE — Patient Outreach (Addendum)
[facility]) Care Management  02/15/2019  [name] [birth year] [id number]   Telephone outreach call   Successful follow up call to patient regarding being  eligible for  transition to [employer].  Patient is looking forward to be able to participate in services .  Reviewed again [facility] care will be managing his care and [facility] care management services will close, patient voiced understanding .  Received return email from [facility] regarding plan outreach to patient .    Plan  Will close case : care will be managed by another care management program, [facility].   Encouraged to contact if any new concerns prior to visit.   [name], RN, [place] Management Coordinator  [phone]- Mobile [phone]- Toll Free Main Office

## 2019-02-19 DIAGNOSIS — I5022 Chronic systolic (congestive) heart failure: Secondary | ICD-10-CM | POA: Diagnosis not present

## 2019-02-19 DIAGNOSIS — I25810 Atherosclerosis of coronary artery bypass graft(s) without angina pectoris: Secondary | ICD-10-CM | POA: Diagnosis not present

## 2019-02-19 DIAGNOSIS — I25728 Atherosclerosis of autologous artery coronary artery bypass graft(s) with other forms of angina pectoris: Secondary | ICD-10-CM | POA: Diagnosis not present

## 2019-02-19 DIAGNOSIS — I10 Essential (primary) hypertension: Secondary | ICD-10-CM | POA: Diagnosis not present

## 2019-02-22 DIAGNOSIS — E1159 Type 2 diabetes mellitus with other circulatory complications: Secondary | ICD-10-CM | POA: Diagnosis not present

## 2019-02-22 DIAGNOSIS — I5022 Chronic systolic (congestive) heart failure: Secondary | ICD-10-CM | POA: Diagnosis not present

## 2019-02-22 DIAGNOSIS — I70219 Atherosclerosis of native arteries of extremities with intermittent claudication, unspecified extremity: Secondary | ICD-10-CM | POA: Diagnosis not present

## 2019-02-22 DIAGNOSIS — I25810 Atherosclerosis of coronary artery bypass graft(s) without angina pectoris: Secondary | ICD-10-CM | POA: Diagnosis not present

## 2019-02-22 DIAGNOSIS — I255 Ischemic cardiomyopathy: Secondary | ICD-10-CM | POA: Diagnosis not present

## 2019-02-22 DIAGNOSIS — I472 Ventricular tachycardia: Secondary | ICD-10-CM | POA: Diagnosis not present

## 2019-02-22 DIAGNOSIS — I5023 Acute on chronic systolic (congestive) heart failure: Secondary | ICD-10-CM | POA: Diagnosis not present

## 2019-02-22 DIAGNOSIS — I25728 Atherosclerosis of autologous artery coronary artery bypass graft(s) with other forms of angina pectoris: Secondary | ICD-10-CM | POA: Diagnosis not present

## 2019-02-22 DIAGNOSIS — I10 Essential (primary) hypertension: Secondary | ICD-10-CM | POA: Diagnosis not present

## 2019-02-22 DIAGNOSIS — E782 Mixed hyperlipidemia: Secondary | ICD-10-CM | POA: Diagnosis not present

## 2019-02-23 DIAGNOSIS — Z95810 Presence of automatic (implantable) cardiac defibrillator: Secondary | ICD-10-CM | POA: Diagnosis not present

## 2019-02-23 DIAGNOSIS — I5022 Chronic systolic (congestive) heart failure: Secondary | ICD-10-CM | POA: Diagnosis not present

## 2019-02-23 DIAGNOSIS — I10 Essential (primary) hypertension: Secondary | ICD-10-CM | POA: Diagnosis not present

## 2019-02-23 DIAGNOSIS — I472 Ventricular tachycardia: Secondary | ICD-10-CM | POA: Diagnosis not present

## 2019-02-23 DIAGNOSIS — I255 Ischemic cardiomyopathy: Secondary | ICD-10-CM | POA: Diagnosis not present

## 2019-02-23 DIAGNOSIS — Z4502 Encounter for adjustment and management of automatic implantable cardiac defibrillator: Secondary | ICD-10-CM | POA: Diagnosis not present

## 2019-03-06 ENCOUNTER — Telehealth: Payer: Self-pay | Admitting: Student

## 2019-03-06 ENCOUNTER — Other Ambulatory Visit: Payer: Self-pay | Admitting: Family

## 2019-03-06 NOTE — Telephone Encounter (Signed)
NP made phone call to schedule a telehealth visit for Palliative medicine. He does not have email capability to set up at this time. Patient reports seeing cardiologist at heart failure clinic at [facility] 2 weeks ago. He reports being started on amiodarone and his torsemide was increased for 2 weeks. He denies chest pain. He also states he had his pacemaker checked recently. He states he never received additional e-tanks. Will call [facility] to follow up on e-tanks. He denies any other needs at this time.

## 2019-03-19 ENCOUNTER — Telehealth: Payer: PPO | Admitting: Family

## 2019-03-19 ENCOUNTER — Telehealth: Payer: Self-pay

## 2019-03-19 NOTE — Telephone Encounter (Signed)
Spoke with Gerald Stabs at the [facility] regarding request for portable oxygen. Per Gerald Stabs, patient is requesting portable oxygen with a mask in case he needs to go to the hospital. Gerald Stabs explained to patient that this can not be provided and that he should call 911 if he needs to go to the hospital. Phineas Real NP updated

## 2019-03-26 DIAGNOSIS — E1122 Type 2 diabetes mellitus with diabetic chronic kidney disease: Secondary | ICD-10-CM | POA: Diagnosis not present

## 2019-03-26 DIAGNOSIS — N183 Chronic kidney disease, stage 3 (moderate): Secondary | ICD-10-CM | POA: Diagnosis not present

## 2019-03-26 DIAGNOSIS — Z794 Long term (current) use of insulin: Secondary | ICD-10-CM | POA: Diagnosis not present

## 2019-03-29 DIAGNOSIS — E119 Type 2 diabetes mellitus without complications: Secondary | ICD-10-CM | POA: Diagnosis not present

## 2019-03-30 DIAGNOSIS — I5022 Chronic systolic (congestive) heart failure: Secondary | ICD-10-CM | POA: Diagnosis not present

## 2019-03-30 DIAGNOSIS — E782 Mixed hyperlipidemia: Secondary | ICD-10-CM | POA: Diagnosis not present

## 2019-03-30 DIAGNOSIS — I255 Ischemic cardiomyopathy: Secondary | ICD-10-CM | POA: Diagnosis not present

## 2019-03-30 DIAGNOSIS — I25728 Atherosclerosis of autologous artery coronary artery bypass graft(s) with other forms of angina pectoris: Secondary | ICD-10-CM | POA: Diagnosis not present

## 2019-03-30 DIAGNOSIS — I25708 Atherosclerosis of coronary artery bypass graft(s), unspecified, with other forms of angina pectoris: Secondary | ICD-10-CM | POA: Diagnosis not present

## 2019-03-30 DIAGNOSIS — I25810 Atherosclerosis of coronary artery bypass graft(s) without angina pectoris: Secondary | ICD-10-CM | POA: Diagnosis not present

## 2019-03-30 DIAGNOSIS — I70219 Atherosclerosis of native arteries of extremities with intermittent claudication, unspecified extremity: Secondary | ICD-10-CM | POA: Diagnosis not present

## 2019-03-30 DIAGNOSIS — I10 Essential (primary) hypertension: Secondary | ICD-10-CM | POA: Diagnosis not present

## 2019-04-03 DIAGNOSIS — E1142 Type 2 diabetes mellitus with diabetic polyneuropathy: Secondary | ICD-10-CM | POA: Diagnosis not present

## 2019-04-03 DIAGNOSIS — E1122 Type 2 diabetes mellitus with diabetic chronic kidney disease: Secondary | ICD-10-CM | POA: Diagnosis not present

## 2019-04-03 DIAGNOSIS — E11649 Type 2 diabetes mellitus with hypoglycemia without coma: Secondary | ICD-10-CM | POA: Diagnosis not present

## 2019-04-03 DIAGNOSIS — E669 Obesity, unspecified: Secondary | ICD-10-CM | POA: Diagnosis not present

## 2019-04-03 DIAGNOSIS — E1169 Type 2 diabetes mellitus with other specified complication: Secondary | ICD-10-CM | POA: Diagnosis not present

## 2019-04-03 DIAGNOSIS — Z794 Long term (current) use of insulin: Secondary | ICD-10-CM | POA: Diagnosis not present

## 2019-04-03 DIAGNOSIS — E1159 Type 2 diabetes mellitus with other circulatory complications: Secondary | ICD-10-CM | POA: Diagnosis not present

## 2019-04-03 DIAGNOSIS — N183 Chronic kidney disease, stage 3 (moderate): Secondary | ICD-10-CM | POA: Diagnosis not present

## 2019-04-06 ENCOUNTER — Telehealth: Payer: Self-pay | Admitting: Student

## 2019-04-06 NOTE — Telephone Encounter (Signed)
NP spoke with patient. F/u telephonic Palliative appointment scheduled for 04/11/2019 at 2pm.

## 2019-04-11 ENCOUNTER — Other Ambulatory Visit: Payer: Self-pay

## 2019-04-11 ENCOUNTER — Other Ambulatory Visit: Payer: PPO | Admitting: Student

## 2019-04-11 DIAGNOSIS — Z515 Encounter for palliative care: Secondary | ICD-10-CM | POA: Diagnosis not present

## 2019-04-11 NOTE — Progress Notes (Signed)
[facility] Consult Note Telephone: [phone]  Fax: [phone]  PATIENT NAME: [name] DOB: 1946/03/16 MRN: [id number]  PRIMARY CARE PROVIDER:   [name], MD  REFERRING PROVIDER:  [name], MD [facility] [place], [place] [address]  RESPONSIBLE PARTY: Self   ASSESSMENT: Due to the COVID-19 crisis, this virtual check-in visit was done via telephone from my office and it was initiated and consent by this patient and or family. Visit held with patient and son [name]. [name] denies any acute distress at present. We discussed ongoing goals of care; [name] would like to limit hospitalizations. We discussed worsening symptoms. He was most recently seen at [facility] HF clinic and he is also seen at [facility] clinic in [place] by NP, [facility]. Dr. Laurance [name] at [facility] HF clinic is notified via [name] with recent changes; message left and awaiting return call. Palliative Medicine will continue to provide support, monitor for changes and declines.          RECOMMENDATIONS and PLAN:  1. Code status: Full Code. 2. Medical goals of therapy-to limit hospitalizations. Continue to follow up at Heart Failure clinic as scheduled. Palliative Medicine will provide ongoing support and make recommendations as needed. Dr. Laurance [name] at [facility] HF clinic notified. 3. Symptom management: dyspnea- continue spiriva, symbicort as ordered, continue torsemide 60mg  daily. He is encouraged to use prn albuterol inhaler 2 puffs every 6 hours prn for shortness of breath/wheezing. Continue CPAP with oxygen at night. Will follow up on prn oxygen that was recommended by [facility]. Weight: continue torsemide 60mg  daily, continue daily weights, monitor sodium intake, limit to 2000mg  daily, fluid intake limit to 1.5 liters as previously ordered.  Palliative Medicine will follow up in 2 weeks or sooner, if needed.      I spent 30 minutes providing this consultation,  from 2:00pm to 2:30pm. More than 50% of the time in this consultation was spent coordinating communication.   HISTORY OF PRESENT ILLNESS:  [name] is a 73 y.o.  male with multiple medical problems including  chronic systolic heart failure,cardiomyopathy, myocardial infarction, hypertension, hypercholesterolemia, copd, diabetes, anemia, prostate cancer, depression, GERD, gout, macular degeneration, obstructive sleep apnea, on CPAP. [name] was hospitalized 2/3 to 01/10/2019 for acute on chronic congestive heart failure, EF 20%. He reports having 3 hospitalizations since January 2020. Palliative Care was asked to help address goals of care; he is seen for follow up visit today. [name] states that he has been having increased shortness of breath with exertion. He states [name] with [facility] came out to see him on Monday and his oxygen sats dropped in the 70's after walking in the home. He states prn oxygen was recommended. He states once he is able to sit down, his breathing returns back to normal. He denies dyspnea at present. He states he has had tremors since his torsemide was increased to 60mg  each morning by Dr. Laurance [name] on 03/30/2019; tremors have worsened in past 5 days. He also reports worsening fatigue in past 5 days and "having very little energy at all." He denies any worsening chest pain. He is still weighing himself daily; Weight: 218 pounds 5/4, 219 pounds 5/5 and 220 pounds 04/11/2019. [name] does report eating shrimp in the past couple of days. His humalog was changed to 10 units before dinner and lantus to 40units qhs by [name]. He denies any other changes.   CODE  STATUS: Full Code  PPS: 50% HOSPICE ELIGIBILITY/DIAGNOSIS: TBD  PAST MEDICAL HISTORY:  Past Medical History:  Diagnosis Date  . Anemia   . Cancer ([place]) 12/2013   prostate  . Cardiogenic pulmonary edema ([place]) 12/19/2014  . Cardiomyopathy, ischemic    . CHF (congestive heart failure) ([place])   . COPD (chronic obstructive pulmonary disease) ([place])   . Depression   . Diabetes mellitus without complication ([place])   . GERD (gastroesophageal reflux disease)   . Gout   . Hypercholesteremia   . Hypertension   . Myocardial infarction ([place]) [id number]  . OSA on CPAP   . Shortness of breath dyspnea     SOCIAL HX:  Social History   Tobacco Use  . Smoking status: Former Smoker    Packs/day: 2.00    Years: 30.00    Pack years: 60.00    Last attempt to quit: 12/03/1996    Years since quitting: 22.3  . Smokeless tobacco: Never Used  Substance Use Topics  . Alcohol use: No    Alcohol/week: 0.0 standard drinks    ALLERGIES:  Allergies  Allergen Reactions  . Brilinta [Ticagrelor] Shortness Of Breath  . Tuberculin Tests Rash  . Benadryl [Diphenhydramine] Other (See Comments)    " Hyperactivity"  . Doxycycline Swelling    Pt went into pulmonary edema.  . Lopid [Gemfibrozil] Swelling    "I gain 1 pound a day for 30 days."       PHYSICAL EXAM:   Physical exam deferred.   Daniel Sl[place], NP

## 2019-04-13 DIAGNOSIS — R0602 Shortness of breath: Secondary | ICD-10-CM | POA: Diagnosis not present

## 2019-04-17 DIAGNOSIS — I5022 Chronic systolic (congestive) heart failure: Secondary | ICD-10-CM | POA: Diagnosis not present

## 2019-04-18 ENCOUNTER — Telehealth: Payer: Self-pay | Admitting: Student

## 2019-04-18 NOTE — Telephone Encounter (Signed)
04/17/2019 2:51pm. Multiple calls today. NP called patient to speak with him after receiving call from nurse with Dr. Tawanna Sat office. She states she had called this NP but not received a return call and she had new orders for patient. She states Dr. Laurance Flatten  was increasing patient's torsemide but he needed labwork. I advised her at that time, that I had not received the message and she needed to call the patient. I also made her aware again that our Palliative role is consultative and again, patient has a PCP, two heart failure clinics, and [facility] now involved as well. Nurse with Dr. Laurance Flatten stated she would call patient. NP spoke with Palliative Medical Director and was advised to make patient aware that he should go through [facility] regarding his care. NP called patient and spoke with patient, wife and son [name] were all present on speaker phone. [name] states he was out getting ice cream. We discussed patient having multiple people involved in his care. He states he had received new orders from [facility] last week and he also received new orders from Dr. Tawanna Sat office today. NP made him aware of complication of having orders/directions coming from multiple providers. We discussed [facility] spearheading his care and providing orders for managing his symptoms/care. He is encouraged to notify [facility] that Dr. Laurance Flatten had called with new orders today. He states he will call them. I asked for [name] to provide this NP with [facility]'s information when he gets home; he has been working with [employer].

## 2019-04-20 DIAGNOSIS — Z20828 Contact with and (suspected) exposure to other viral communicable diseases: Secondary | ICD-10-CM | POA: Diagnosis not present

## 2019-04-20 DIAGNOSIS — I5022 Chronic systolic (congestive) heart failure: Secondary | ICD-10-CM | POA: Diagnosis not present

## 2019-04-20 DIAGNOSIS — J449 Chronic obstructive pulmonary disease, unspecified: Secondary | ICD-10-CM | POA: Diagnosis not present

## 2019-04-20 DIAGNOSIS — R0602 Shortness of breath: Secondary | ICD-10-CM | POA: Diagnosis not present

## 2019-04-25 DIAGNOSIS — I5022 Chronic systolic (congestive) heart failure: Secondary | ICD-10-CM | POA: Diagnosis not present

## 2019-04-25 DIAGNOSIS — I255 Ischemic cardiomyopathy: Secondary | ICD-10-CM | POA: Diagnosis not present

## 2019-05-09 DIAGNOSIS — I5022 Chronic systolic (congestive) heart failure: Secondary | ICD-10-CM | POA: Diagnosis not present

## 2019-05-11 DIAGNOSIS — D638 Anemia in other chronic diseases classified elsewhere: Secondary | ICD-10-CM | POA: Diagnosis not present

## 2019-05-11 DIAGNOSIS — I5023 Acute on chronic systolic (congestive) heart failure: Secondary | ICD-10-CM | POA: Diagnosis not present

## 2019-05-11 DIAGNOSIS — E782 Mixed hyperlipidemia: Secondary | ICD-10-CM | POA: Diagnosis not present

## 2019-05-14 ENCOUNTER — Encounter: Payer: Self-pay | Admitting: Family

## 2019-05-14 ENCOUNTER — Other Ambulatory Visit: Payer: Self-pay

## 2019-05-14 ENCOUNTER — Ambulatory Visit: Payer: PPO | Attending: Family | Admitting: Family

## 2019-05-14 VITALS — BP 90/60 | HR 81 | Resp 18 | Ht 67.0 in | Wt 225.5 lb

## 2019-05-14 DIAGNOSIS — I255 Ischemic cardiomyopathy: Secondary | ICD-10-CM | POA: Insufficient documentation

## 2019-05-14 DIAGNOSIS — E7800 Pure hypercholesterolemia, unspecified: Secondary | ICD-10-CM | POA: Diagnosis not present

## 2019-05-14 DIAGNOSIS — Z87891 Personal history of nicotine dependence: Secondary | ICD-10-CM | POA: Insufficient documentation

## 2019-05-14 DIAGNOSIS — I10 Essential (primary) hypertension: Secondary | ICD-10-CM

## 2019-05-14 DIAGNOSIS — F329 Major depressive disorder, single episode, unspecified: Secondary | ICD-10-CM | POA: Diagnosis not present

## 2019-05-14 DIAGNOSIS — Z79899 Other long term (current) drug therapy: Secondary | ICD-10-CM | POA: Diagnosis not present

## 2019-05-14 DIAGNOSIS — K219 Gastro-esophageal reflux disease without esophagitis: Secondary | ICD-10-CM | POA: Insufficient documentation

## 2019-05-14 DIAGNOSIS — Z7951 Long term (current) use of inhaled steroids: Secondary | ICD-10-CM | POA: Insufficient documentation

## 2019-05-14 DIAGNOSIS — I110 Hypertensive heart disease with heart failure: Secondary | ICD-10-CM | POA: Diagnosis not present

## 2019-05-14 DIAGNOSIS — Z794 Long term (current) use of insulin: Secondary | ICD-10-CM | POA: Diagnosis not present

## 2019-05-14 DIAGNOSIS — Z8249 Family history of ischemic heart disease and other diseases of the circulatory system: Secondary | ICD-10-CM | POA: Diagnosis not present

## 2019-05-14 DIAGNOSIS — Z8546 Personal history of malignant neoplasm of prostate: Secondary | ICD-10-CM | POA: Insufficient documentation

## 2019-05-14 DIAGNOSIS — I252 Old myocardial infarction: Secondary | ICD-10-CM | POA: Diagnosis not present

## 2019-05-14 DIAGNOSIS — Z833 Family history of diabetes mellitus: Secondary | ICD-10-CM | POA: Insufficient documentation

## 2019-05-14 DIAGNOSIS — Z951 Presence of aortocoronary bypass graft: Secondary | ICD-10-CM | POA: Diagnosis not present

## 2019-05-14 DIAGNOSIS — Z955 Presence of coronary angioplasty implant and graft: Secondary | ICD-10-CM | POA: Diagnosis not present

## 2019-05-14 DIAGNOSIS — G4733 Obstructive sleep apnea (adult) (pediatric): Secondary | ICD-10-CM | POA: Diagnosis not present

## 2019-05-14 DIAGNOSIS — M109 Gout, unspecified: Secondary | ICD-10-CM | POA: Insufficient documentation

## 2019-05-14 DIAGNOSIS — Z888 Allergy status to other drugs, medicaments and biological substances status: Secondary | ICD-10-CM | POA: Insufficient documentation

## 2019-05-14 DIAGNOSIS — J449 Chronic obstructive pulmonary disease, unspecified: Secondary | ICD-10-CM | POA: Diagnosis not present

## 2019-05-14 DIAGNOSIS — Z7902 Long term (current) use of antithrombotics/antiplatelets: Secondary | ICD-10-CM | POA: Diagnosis not present

## 2019-05-14 DIAGNOSIS — Z7982 Long term (current) use of aspirin: Secondary | ICD-10-CM | POA: Diagnosis not present

## 2019-05-14 DIAGNOSIS — E119 Type 2 diabetes mellitus without complications: Secondary | ICD-10-CM | POA: Insufficient documentation

## 2019-05-14 DIAGNOSIS — Z881 Allergy status to other antibiotic agents status: Secondary | ICD-10-CM | POA: Diagnosis not present

## 2019-05-14 DIAGNOSIS — I5022 Chronic systolic (congestive) heart failure: Secondary | ICD-10-CM | POA: Insufficient documentation

## 2019-05-14 DIAGNOSIS — I509 Heart failure, unspecified: Secondary | ICD-10-CM | POA: Diagnosis present

## 2019-05-14 NOTE — Progress Notes (Signed)
Patient ID: [name], male    DOB: [birth year], 73 y.o.   MRN: [id number]  HPI  [name] is a 73 y/o male with a history of prostate cancer, DM, hyperlipidemia, HTN, anemia, COPD, GERD, MI, previous tobacco use and chronic heart failure.   Echo report from 05/09/2019 reviewed and showed an EF of 20% along with mild Daniel/TR Echo report from 09/03/18 reviewed and showed an EF of 20% along with mild TR. Echo report from 02/18/18 reviewed and showed an EF of 20-25% along with trivial AR and moderate Daniel. Echo report from 12/05/17 reviewed and showed an EF of 20-25% along with mild Daniel and moderate Daniel.   Cardiac catheterization done 09/04/18 showed:  ejection fraction of 15% severe 3 vessel coronary artery disease There is occlusion of all native vessels at the ostium Patent graft to obtuse marginal 2 Patent graft to diagonal artery Patent graft to left anterior descending artery There is significant coronary artery disease and or stenosis involving the graft ostium to the right coronary  Stent placed to ostial SVG to RCA.   Cardiac catheterization done 02/02/16 showed severed 3-vessel CAD with patent grafts. Continue medication management along with dual antiplatelet therapy.   Admitted 01/08/2019 due to chest pain and acute on chronic HF. Cardiology and palliative care consults were obtained. Initially given IV lasix and then transitioned to oral diuretics. Discharged after 2 days. Was in the ED 12/28/2018 due to chest pain and HF exacerbation. Given IV diuretics and released. Admitted 12/09/2018 due to flu and pneumonia. Given IV steroids, antibiotics and tamiflu. Discharged after 3 days.    He presents today for a follow-up visit with a chief complaint of moderate shortness of breath upon minimal exertion. He describes this as chronic in nature having been present for several years although he does feel like his shortness of breath has worsened. He has associated fatigue, cough and slight pedal  edema along with this. He denies any difficulty sleeping, abdominal distention, palpitations, chest pain, dizziness or weight gain. He is slightly unsure of his medications as one moment he says that he's taking amiodarone and spironolactone and then later he says that he's not taking them.   Past Medical History:  Diagnosis Date  . Anemia   . Cancer ([place]) 12/2013   prostate  . Cardiogenic pulmonary edema ([place]) 12/19/2014  . Cardiomyopathy, ischemic   . CHF (congestive heart failure) ([place])   . COPD (chronic obstructive pulmonary disease) ([place])   . Depression   . Diabetes mellitus without complication ([place])   . GERD (gastroesophageal reflux disease)   . Gout   . Hypercholesteremia   . Hypertension   . Myocardial infarction ([place]) [id number]  . OSA on CPAP   . Shortness of breath dyspnea    Past Surgical History:  Procedure Laterality Date  . CARDIAC CATHETERIZATION N/A 02/02/2016   Procedure: Left Heart Cath and Coronary Angiography;  Surgeon: [name], MD;  Location: [facility];  Service: Cardiovascular;  Laterality: N/A;  . CORONARY ANGIOPLASTY WITH STENT PLACEMENT    . CORONARY ARTERY BYPASS GRAFT  11/24/2010  . CORONARY STENT INTERVENTION N/A 09/04/2018   Procedure: CORONARY STENT INTERVENTION;  Surgeon: [name], MD;  Location: [facility];  Service: Cardiovascular;  Laterality: N/A;  . IMPLANTABLE CARDIOVERTER DEFIBRILLATOR (ICD) GENERATOR CHANGE Left 12/12/2015   Procedure: DUAL LEAD PLACEMENT CARDIAC DIFIBRILLATOR;  Surgeon: [name], MD;  Location: [facility];  Service: Cardiovascular;  Laterality: Left;  .  LEFT HEART CATH AND CORS/GRAFTS ANGIOGRAPHY N/A 09/04/2018   Procedure: LEFT HEART CATH AND CORS/GRAFTS ANGIOGRAPHY;  Surgeon: [name], MD;  Location: [facility];  Service: Cardiovascular;  Laterality: N/A;  . TONSILLECTOMY     Family History  Problem Relation Age of Onset  . CAD Mother   . Cancer Mother   .  Diabetes Mother   . Alzheimer's disease Father   . Cancer Father   . Heart disease Father    Social History   Tobacco Use  . Smoking status: Former Smoker    Packs/day: 2.00    Years: 30.00    Pack years: 60.00    Last attempt to quit: 12/03/1996    Years since quitting: 22.4  . Smokeless tobacco: Never Used  Substance Use Topics  . Alcohol use: No    Alcohol/week: 0.0 standard drinks   Allergies  Allergen Reactions  . Brilinta [Ticagrelor] Shortness Of Breath  . Tuberculin Tests Rash  . Benadryl [Diphenhydramine] Other (See Comments)    " Hyperactivity"  . Doxycycline Swelling    Pt went into pulmonary edema.  . Lopid [Gemfibrozil] Swelling    "I gain 1 pound a day for 30 days."   Prior to Admission medications   Medication Sig Start Date End Date Taking? Authorizing Provider  acetaminophen (TYLENOL) 500 MG tablet Take 1,000 mg by mouth daily as needed for moderate pain.   Yes [provider]  albuterol (PROVENTIL HFA;VENTOLIN HFA) 108 (90 Base) MCG/ACT inhaler Inhale 2 puffs into the lungs every 6 (six) hours as needed for wheezing or shortness of breath. 06/04/18  Yes [name], [name], MD  allopurinol (ZYLOPRIM) 100 MG tablet Take 100 mg by mouth daily.   Yes [provider]  aspirin 81 MG chewable tablet Chew 1 tablet (81 mg total) by mouth daily. 09/06/18  Yes Mayo, Pete [name], MD  budesonide-formoterol [facility]) 80-4.5 MCG/ACT inhaler Inhale 2 puffs into the lungs 2 (two) times daily.   Yes [provider]  calcium carbonate (OS-CAL) 600 MG tablet Take 600 mg by mouth 2 (two) times daily.   Yes [provider]  calcium carbonate (TUMS - DOSED IN MG ELEMENTAL CALCIUM) 500 MG chewable tablet Chew 1 tablet by mouth as needed for indigestion or heartburn.   Yes [provider]  carvedilol (COREG) 3.125 MG tablet Take 1 tablet (3.125 mg total) by mouth 2 (two) times daily with a meal. 01/01/18  Yes Gladstone [name], MD   clopidogrel (PLAVIX) 75 MG tablet Take 75 mg by mouth daily.   Yes [provider]  Co-Enzyme Q10 100 MG CAPS Take 1 capsule by mouth daily.   Yes [provider]  ferrous sulfate 325 (65 FE) MG tablet Take 325 mg by mouth 2 (two) times daily with a meal.   Yes [provider]  finasteride (PROSCAR) 5 MG tablet Take 5 mg by mouth daily.   Yes [provider]  FLUoxetine (PROZAC) 20 MG capsule Take 20 mg by mouth daily.   Yes [provider]  gabapentin (NEURONTIN) 300 MG capsule Take 300 mg by mouth 2 (two) times daily.    Yes [provider]  insulin glargine (LANTUS) 100 UNIT/ML injection Inject 0.8 mLs (80 Units total) into the skin at bedtime. Patient taking differently: Inject 60 Units into the skin at bedtime.  12/12/18  Yes Vaughan Basta, MD  insulin lispro (HUMALOG) 100 UNIT/ML KiwkPen Inject 0-15 Units into the skin 3 (three) times daily with  meals. About 25-30 units total for the day   Yes [provider]  lansoprazole (PREVACID) 15 MG capsule Take 30 mg by mouth 2 (two) times daily.   Yes [provider]  loratadine (CLARITIN) 10 MG tablet Take 10 mg by mouth daily.   Yes [provider]  magnesium oxide (MAG-OX) 400 (241.3 Mg) MG tablet TAKE ONE TABLET BY MOUTH TWICE A DAY 03/06/19  Yes ,  A, FNP  Multiple Vitamins-Minerals (CENTRUM SILVER PO) Take 1 tablet by mouth every morning.   Yes [provider]  Multiple Vitamins-Minerals (PRESERVISION AREDS 2) CAPS Take 1 capsule by mouth 2 (two) times daily. For macular degeneration- eye vitamins   Yes [provider]  nitroGLYCERIN (NITROSTAT) 0.4 MG SL tablet Place 0.4 mg under the tongue every 5 (five) minutes as needed for chest pain.   Yes [provider]  ranolazine (RANEXA) 500 MG 12 hr tablet Take 1 tablet (500 mg total) by mouth 2 (two) times daily. 05/30/18  Yes , Otila Kluver A, FNP  rosuvastatin (CRESTOR) 20 MG  tablet Take 20 mg by mouth daily.   Yes [provider]  tamsulosin (FLOMAX) 0.4 MG CAPS capsule Take 0.4 mg by mouth at bedtime.    Yes [provider]  tiotropium (SPIRIVA) 18 MCG inhalation capsule Place 1 capsule (18 mcg total) into inhaler and inhale daily. 06/08/18  Yes Wieting, Richard, MD  torsemide (DEMADEX) 20 MG tablet Take 20 mg by mouth 2 (two) times daily.    Yes [provider]    Review of Systems  Constitutional: Positive for fatigue. Negative for appetite change.  HENT: Negative for congestion, postnasal drip, sore throat and voice change.   Eyes: Negative.   Respiratory: Positive for cough ("some") and shortness of breath (with minimal exertion). Negative for chest tightness and wheezing.   Cardiovascular: Positive for leg swelling (minimal around ankles). Negative for chest pain and palpitations.  Gastrointestinal: Negative for abdominal distention and abdominal pain.  Endocrine: Negative.   Genitourinary: Negative.   Musculoskeletal: Positive for arthralgias (leg pain when walking long distances).  Skin: Negative.   Allergic/Immunologic: Negative.   Neurological: Negative for dizziness and light-headedness.  Hematological: Negative for adenopathy. Bruises/bleeds easily.  Psychiatric/Behavioral: Negative for dysphoric mood and sleep disturbance (sleeping well with CPAP and oxygen). The patient is not nervous/anxious.    Vitals:   05/14/19 1359  BP: (!) 86/30  Pulse: 81  Resp: 18  SpO2: 92%  Weight: 225 lb 8 oz (102.3 kg)  Height: 5\' 7"  (1.702 m)   Wt Readings from Last 3 Encounters:  05/14/19 225 lb 8 oz (102.3 kg)  02/12/19 231 lb 6 oz (105 kg)  02/05/19 223 lb (101.2 kg)   Lab Results  Component Value Date   CREATININE 1.17 01/09/2019   CREATININE 1.12 01/08/2019   CREATININE 1.10 01/08/2019    Physical Exam  Constitutional: He is oriented to person, place, and time. He appears well-developed and well-nourished.  HENT:   Head: Normocephalic and atraumatic.  Neck: Normal range of motion. Neck supple. No JVD present.  Cardiovascular: Normal rate and regular rhythm.  Pulmonary/Chest: Effort normal. He has no wheezes. He has no rales.  Abdominal: He exhibits no distension. There is no abdominal tenderness.  Musculoskeletal:        General: No tenderness or edema.  Neurological: He is alert and oriented to person, place, and time.  Skin: Skin is warm and dry.  Psychiatric: He has a normal mood and affect.  His behavior is normal. Thought content normal.  Nursing note and vitals reviewed.  Assessment & Plan:  1: Chronic heart failure with reduced ejection fraction-  - NYHA class III - euvolemic today - has been weighing daily and home weight chart was reviewed.  - Reminded to call for an overnight weight gain of 2 pounds or > or a weekly weight gain of >5 pounds - weight down 6 pounds from last visit 3 months ago - not adding salt  - says that he's aware that eating out is going to give him more sodium than if he eats at home; ate at [employer] yesterday - continues to drink mostly water - saw cardiology Laurance Flatten) at [facility] 04/20/2019 - saw cardiology Nehemiah Massed) 02/19/2019 - BNP from 01/07/2019 was 460.0 - BP will not tolerate entresto - palliative care telemedicine visit done 04/11/2019 - wearing oxygen at 5L PRN  2: HTN- - BP low today & was also low at cardiology visit on 04/20/2019 - machine BP was 86/30 and rechecked with manual cuff was 90/60; son will doublecheck that his spironolactone and amiodarone have been removed from patient's medication box - saw PCP ([facility]) 02/07/2019 - BMP from 03/26/2019 reviewed and showed sodium 142, potassium 4.6 creatinine 1.9 and GFR 35 - wearing CPAP nightly   Patient did not bring his medications nor a list. Each medication was verbally reviewed with the patient and he was encouraged to bring the bottles to every visit to confirm accuracy of  list.  Since patient is following at the Advanced [facility] Clinic at [facility], will not make a return appointment for patient at this time. He is also being followed by [facility].   Advised patient that he can call back at anytime for any questions.

## 2019-05-14 NOTE — Patient Instructions (Addendum)
Continue weighing daily and call for an overnight weight gain of > 2 pounds or a weekly weight gain of >5 pounds   Doublecheck to make sure that you've stopped the following medications based on 04/20/2019 Dr. Tawanna Sat note:  spironolactone. amiodarone.

## 2019-05-15 DIAGNOSIS — I10 Essential (primary) hypertension: Secondary | ICD-10-CM | POA: Diagnosis not present

## 2019-05-15 DIAGNOSIS — I25810 Atherosclerosis of coronary artery bypass graft(s) without angina pectoris: Secondary | ICD-10-CM | POA: Diagnosis not present

## 2019-05-15 DIAGNOSIS — I5022 Chronic systolic (congestive) heart failure: Secondary | ICD-10-CM | POA: Diagnosis not present

## 2019-05-17 DIAGNOSIS — D638 Anemia in other chronic diseases classified elsewhere: Secondary | ICD-10-CM | POA: Diagnosis not present

## 2019-05-17 DIAGNOSIS — I952 Hypotension due to drugs: Secondary | ICD-10-CM | POA: Diagnosis not present

## 2019-05-17 DIAGNOSIS — E782 Mixed hyperlipidemia: Secondary | ICD-10-CM | POA: Diagnosis not present

## 2019-05-17 DIAGNOSIS — I5022 Chronic systolic (congestive) heart failure: Secondary | ICD-10-CM | POA: Diagnosis not present

## 2019-05-18 DIAGNOSIS — I25810 Atherosclerosis of coronary artery bypass graft(s) without angina pectoris: Secondary | ICD-10-CM | POA: Diagnosis not present

## 2019-05-18 DIAGNOSIS — I5022 Chronic systolic (congestive) heart failure: Secondary | ICD-10-CM | POA: Diagnosis not present

## 2019-05-18 DIAGNOSIS — E782 Mixed hyperlipidemia: Secondary | ICD-10-CM | POA: Diagnosis not present

## 2019-05-18 DIAGNOSIS — I10 Essential (primary) hypertension: Secondary | ICD-10-CM | POA: Diagnosis not present

## 2019-05-18 DIAGNOSIS — I255 Ischemic cardiomyopathy: Secondary | ICD-10-CM | POA: Diagnosis not present

## 2019-05-18 DIAGNOSIS — I70219 Atherosclerosis of native arteries of extremities with intermittent claudication, unspecified extremity: Secondary | ICD-10-CM | POA: Diagnosis not present

## 2019-05-24 DIAGNOSIS — I5022 Chronic systolic (congestive) heart failure: Secondary | ICD-10-CM | POA: Diagnosis not present

## 2019-05-25 ENCOUNTER — Inpatient Hospital Stay
Admission: EM | Admit: 2019-05-25 | Discharge: 2019-05-31 | DRG: 291 | Disposition: A | Discharge status: Home / Self Care | Payer: PPO | Attending: Internal Medicine | Admitting: Internal Medicine

## 2019-05-25 ENCOUNTER — Other Ambulatory Visit: Payer: Self-pay

## 2019-05-25 ENCOUNTER — Emergency Department: Payer: PPO

## 2019-05-25 DIAGNOSIS — Z9981 Dependence on supplemental oxygen: Secondary | ICD-10-CM

## 2019-05-25 DIAGNOSIS — J90 Pleural effusion, not elsewhere classified: Secondary | ICD-10-CM | POA: Diagnosis not present

## 2019-05-25 DIAGNOSIS — R0689 Other abnormalities of breathing: Secondary | ICD-10-CM | POA: Diagnosis not present

## 2019-05-25 DIAGNOSIS — Z888 Allergy status to other drugs, medicaments and biological substances status: Secondary | ICD-10-CM

## 2019-05-25 DIAGNOSIS — I2510 Atherosclerotic heart disease of native coronary artery without angina pectoris: Secondary | ICD-10-CM | POA: Diagnosis present

## 2019-05-25 DIAGNOSIS — R0602 Shortness of breath: Secondary | ICD-10-CM

## 2019-05-25 DIAGNOSIS — E785 Hyperlipidemia, unspecified: Secondary | ICD-10-CM | POA: Diagnosis not present

## 2019-05-25 DIAGNOSIS — I420 Dilated cardiomyopathy: Secondary | ICD-10-CM | POA: Diagnosis not present

## 2019-05-25 DIAGNOSIS — I5023 Acute on chronic systolic (congestive) heart failure: Secondary | ICD-10-CM | POA: Diagnosis not present

## 2019-05-25 DIAGNOSIS — Z794 Long term (current) use of insulin: Secondary | ICD-10-CM | POA: Diagnosis not present

## 2019-05-25 DIAGNOSIS — R609 Edema, unspecified: Secondary | ICD-10-CM | POA: Diagnosis not present

## 2019-05-25 DIAGNOSIS — Z8546 Personal history of malignant neoplasm of prostate: Secondary | ICD-10-CM

## 2019-05-25 DIAGNOSIS — J9690 Respiratory failure, unspecified, unspecified whether with hypoxia or hypercapnia: Secondary | ICD-10-CM

## 2019-05-25 DIAGNOSIS — N400 Enlarged prostate without lower urinary tract symptoms: Secondary | ICD-10-CM | POA: Diagnosis not present

## 2019-05-25 DIAGNOSIS — Z20828 Contact with and (suspected) exposure to other viral communicable diseases: Secondary | ICD-10-CM | POA: Diagnosis present

## 2019-05-25 DIAGNOSIS — K219 Gastro-esophageal reflux disease without esophagitis: Secondary | ICD-10-CM | POA: Diagnosis not present

## 2019-05-25 DIAGNOSIS — Z7902 Long term (current) use of antithrombotics/antiplatelets: Secondary | ICD-10-CM | POA: Diagnosis not present

## 2019-05-25 DIAGNOSIS — Z95810 Presence of automatic (implantable) cardiac defibrillator: Secondary | ICD-10-CM

## 2019-05-25 DIAGNOSIS — I517 Cardiomegaly: Secondary | ICD-10-CM | POA: Diagnosis not present

## 2019-05-25 DIAGNOSIS — I252 Old myocardial infarction: Secondary | ICD-10-CM

## 2019-05-25 DIAGNOSIS — I130 Hypertensive heart and chronic kidney disease with heart failure and stage 1 through stage 4 chronic kidney disease, or unspecified chronic kidney disease: Secondary | ICD-10-CM | POA: Diagnosis not present

## 2019-05-25 DIAGNOSIS — Z887 Allergy status to serum and vaccine status: Secondary | ICD-10-CM

## 2019-05-25 DIAGNOSIS — J440 Chronic obstructive pulmonary disease with acute lower respiratory infection: Secondary | ICD-10-CM | POA: Diagnosis present

## 2019-05-25 DIAGNOSIS — J9811 Atelectasis: Secondary | ICD-10-CM | POA: Diagnosis not present

## 2019-05-25 DIAGNOSIS — I255 Ischemic cardiomyopathy: Secondary | ICD-10-CM | POA: Diagnosis present

## 2019-05-25 DIAGNOSIS — I959 Hypotension, unspecified: Secondary | ICD-10-CM | POA: Diagnosis not present

## 2019-05-25 DIAGNOSIS — Z881 Allergy status to other antibiotic agents status: Secondary | ICD-10-CM

## 2019-05-25 DIAGNOSIS — E119 Type 2 diabetes mellitus without complications: Secondary | ICD-10-CM | POA: Diagnosis not present

## 2019-05-25 DIAGNOSIS — Z8249 Family history of ischemic heart disease and other diseases of the circulatory system: Secondary | ICD-10-CM

## 2019-05-25 DIAGNOSIS — J181 Lobar pneumonia, unspecified organism: Secondary | ICD-10-CM | POA: Diagnosis not present

## 2019-05-25 DIAGNOSIS — G4733 Obstructive sleep apnea (adult) (pediatric): Secondary | ICD-10-CM | POA: Diagnosis present

## 2019-05-25 DIAGNOSIS — R0902 Hypoxemia: Secondary | ICD-10-CM | POA: Diagnosis not present

## 2019-05-25 DIAGNOSIS — M109 Gout, unspecified: Secondary | ICD-10-CM | POA: Diagnosis present

## 2019-05-25 DIAGNOSIS — Z7982 Long term (current) use of aspirin: Secondary | ICD-10-CM

## 2019-05-25 DIAGNOSIS — Z87891 Personal history of nicotine dependence: Secondary | ICD-10-CM

## 2019-05-25 DIAGNOSIS — N189 Chronic kidney disease, unspecified: Secondary | ICD-10-CM | POA: Diagnosis not present

## 2019-05-25 DIAGNOSIS — J189 Pneumonia, unspecified organism: Secondary | ICD-10-CM | POA: Diagnosis not present

## 2019-05-25 DIAGNOSIS — E1122 Type 2 diabetes mellitus with diabetic chronic kidney disease: Secondary | ICD-10-CM | POA: Diagnosis not present

## 2019-05-25 DIAGNOSIS — Z951 Presence of aortocoronary bypass graft: Secondary | ICD-10-CM

## 2019-05-25 DIAGNOSIS — Z955 Presence of coronary angioplasty implant and graft: Secondary | ICD-10-CM

## 2019-05-25 DIAGNOSIS — Z7951 Long term (current) use of inhaled steroids: Secondary | ICD-10-CM

## 2019-05-25 DIAGNOSIS — J810 Acute pulmonary edema: Secondary | ICD-10-CM | POA: Diagnosis not present

## 2019-05-25 DIAGNOSIS — J9621 Acute and chronic respiratory failure with hypoxia: Secondary | ICD-10-CM | POA: Diagnosis not present

## 2019-05-25 DIAGNOSIS — J9601 Acute respiratory failure with hypoxia: Secondary | ICD-10-CM | POA: Diagnosis not present

## 2019-05-25 DIAGNOSIS — J9801 Acute bronchospasm: Secondary | ICD-10-CM | POA: Diagnosis not present

## 2019-05-25 DIAGNOSIS — Z79899 Other long term (current) drug therapy: Secondary | ICD-10-CM

## 2019-05-25 DIAGNOSIS — J441 Chronic obstructive pulmonary disease with (acute) exacerbation: Secondary | ICD-10-CM | POA: Diagnosis not present

## 2019-05-25 DIAGNOSIS — Z833 Family history of diabetes mellitus: Secondary | ICD-10-CM

## 2019-05-25 DIAGNOSIS — I10 Essential (primary) hypertension: Secondary | ICD-10-CM | POA: Diagnosis not present

## 2019-05-25 LAB — COMPREHENSIVE METABOLIC PANEL
ALT: 13 U/L (ref 0–44)
AST: 22 U/L (ref 15–41)
Albumin: 3.7 g/dL (ref 3.5–5.0)
Alkaline Phosphatase: 90 U/L (ref 38–126)
Anion gap: 13 (ref 5–15)
BUN: 30 mg/dL — ABNORMAL HIGH (ref 8–23)
CO2: 26 mmol/L (ref 22–32)
Calcium: 8.7 mg/dL — ABNORMAL LOW (ref 8.9–10.3)
Chloride: 98 mmol/L (ref 98–111)
Creatinine, Ser: 1.86 mg/dL — ABNORMAL HIGH (ref 0.61–1.24)
GFR calc Af Amer: 40 mL/min — ABNORMAL LOW (ref >60)
GFR calc non Af Amer: 35 mL/min — ABNORMAL LOW (ref >60)
Glucose, Bld: 119 mg/dL — ABNORMAL HIGH (ref 70–99)
Potassium: 4 mmol/L (ref 3.5–5.1)
Sodium: 137 mmol/L (ref 135–145)
Total Bilirubin: 0.8 mg/dL (ref 0.3–1.2)
Total Protein: 7.1 g/dL (ref 6.5–8.1)

## 2019-05-25 LAB — CBC WITH DIFFERENTIAL/PLATELET
Abs Immature Granulocytes: 0.04 10*3/uL (ref 0.00–0.07)
Basophils Absolute: 0 10*3/uL (ref 0.0–0.1)
Basophils Relative: 0 %
Eosinophils Absolute: 0.1 10*3/uL (ref 0.0–0.5)
Eosinophils Relative: 1 %
HCT: 31.3 % — ABNORMAL LOW (ref 39.0–52.0)
Hemoglobin: 9.9 g/dL — ABNORMAL LOW (ref 13.0–17.0)
Immature Granulocytes: 0 %
Lymphocytes Relative: 6 %
Lymphs Abs: 0.6 10*3/uL — ABNORMAL LOW (ref 0.7–4.0)
MCH: 27.8 pg (ref 26.0–34.0)
MCHC: 31.6 g/dL (ref 30.0–36.0)
MCV: 87.9 fL (ref 80.0–100.0)
Monocytes Absolute: 0.8 10*3/uL (ref 0.1–1.0)
Monocytes Relative: 8 %
Neutro Abs: 8.5 10*3/uL — ABNORMAL HIGH (ref 1.7–7.7)
Neutrophils Relative %: 85 %
Platelets: 235 10*3/uL (ref 150–400)
RBC: 3.56 MIL/uL — ABNORMAL LOW (ref 4.22–5.81)
RDW: 17.9 % — ABNORMAL HIGH (ref 11.5–15.5)
WBC: 10 10*3/uL (ref 4.0–10.5)
nRBC: 0 % (ref 0.0–0.2)

## 2019-05-25 LAB — TROPONIN I: Troponin I: 0.04 ng/mL (ref <0.03)

## 2019-05-25 LAB — SARS CORONAVIRUS 2 BY RT PCR (HOSPITAL ORDER, PERFORMED IN ~~LOC~~ HOSPITAL LAB): SARS Coronavirus 2: NEGATIVE

## 2019-05-25 LAB — BRAIN NATRIURETIC PEPTIDE: B Natriuretic Peptide: 1696 pg/mL — ABNORMAL HIGH (ref 0.0–100.0)

## 2019-05-25 MED ORDER — SODIUM CHLORIDE 0.9 % IV SOLN
500.0000 mg | Freq: Once | INTRAVENOUS | Status: AC
  Administered 2019-05-25: 500 mg via INTRAVENOUS
  Filled 2019-05-25: qty 500

## 2019-05-25 MED ORDER — ONDANSETRON HCL 4 MG PO TABS: 4.0000 mg | ORAL_TABLET | Freq: Four times a day (QID) | ORAL | Status: DC | PRN

## 2019-05-25 MED ORDER — SODIUM CHLORIDE 0.9% FLUSH: 3.0000 mL | INTRAVENOUS | Status: DC | PRN

## 2019-05-25 MED ORDER — SODIUM CHLORIDE 0.9 % IV BOLUS
500.0000 mL | Freq: Once | INTRAVENOUS | Status: AC
  Administered 2019-05-25: 500 mL via INTRAVENOUS

## 2019-05-25 MED ORDER — GUAIFENESIN ER 600 MG PO TB12
600.0000 mg | ORAL_TABLET | Freq: Two times a day (BID) | ORAL | Status: DC
  Administered 2019-05-26 – 2019-05-27 (×3): 600 mg via ORAL
  Filled 2019-05-25 (×3): qty 1

## 2019-05-25 MED ORDER — TRAZODONE HCL 50 MG PO TABS: 25.0000 mg | ORAL_TABLET | Freq: Every evening | ORAL | Status: DC | PRN

## 2019-05-25 MED ORDER — SODIUM CHLORIDE 0.9% FLUSH
3.0000 mL | Freq: Two times a day (BID) | INTRAVENOUS | Status: DC
  Administered 2019-05-26 – 2019-05-29 (×6): 3 mL via INTRAVENOUS

## 2019-05-25 MED ORDER — ONDANSETRON HCL 4 MG/2ML IJ SOLN: 4.0000 mg | Freq: Four times a day (QID) | INTRAMUSCULAR | Status: DC | PRN

## 2019-05-25 MED ORDER — ENOXAPARIN SODIUM 40 MG/0.4ML ~~LOC~~ SOLN
40.0000 mg | SUBCUTANEOUS | Status: DC
  Administered 2019-05-26 – 2019-05-27 (×2): 40 mg via SUBCUTANEOUS
  Filled 2019-05-25 (×2): qty 0.4

## 2019-05-25 MED ORDER — SODIUM CHLORIDE 0.9 % IV SOLN
250.0000 mL | INTRAVENOUS | Status: DC | PRN
  Administered 2019-05-26: 250 mL via INTRAVENOUS

## 2019-05-25 MED ORDER — SODIUM CHLORIDE 0.9 % IV SOLN
500.0000 mg | INTRAVENOUS | Status: DC
  Filled 2019-05-25: qty 500

## 2019-05-25 MED ORDER — ACETAMINOPHEN 650 MG RE SUPP: 650.0000 mg | Freq: Four times a day (QID) | RECTAL | Status: DC | PRN

## 2019-05-25 MED ORDER — SODIUM CHLORIDE 0.9 % IV SOLN
1.0000 g | Freq: Once | INTRAVENOUS | Status: AC
  Administered 2019-05-25: 1 g via INTRAVENOUS
  Filled 2019-05-25: qty 10

## 2019-05-25 MED ORDER — SODIUM CHLORIDE 0.9 % IV SOLN
2.0000 g | INTRAVENOUS | Status: DC
  Administered 2019-05-26 – 2019-05-28 (×3): 2 g via INTRAVENOUS
  Filled 2019-05-25 (×3): qty 2
  Filled 2019-05-25 (×2): qty 20

## 2019-05-25 MED ORDER — ACETAMINOPHEN 325 MG PO TABS: 650.0000 mg | ORAL_TABLET | Freq: Four times a day (QID) | ORAL | Status: DC | PRN

## 2019-05-25 NOTE — ED Notes (Signed)
Patient in contact with his son via personal cell phone, updated son as to his care plan

## 2019-05-25 NOTE — ED Notes (Signed)
Patient states his PA came out to the house today and "gave him 40mg  lasix" for SOB. Patient has PRN O2 at home, but did not wear it until this afternoon

## 2019-05-25 NOTE — ED Provider Notes (Signed)
[facility] Emergency Department Provider Note   ____________________________________________    I have reviewed the triage vital signs and the nursing notes.   HISTORY  Chief Complaint Shortness of Breath     HPI [name] is a 73 y.o. male who presents with complaints of shortness of breath.  Patient reports over the last several days he has had worsening shortness of breath especially with exertion, he does report that he wears CPAP at night typically and occasionally has to wear oxygen during the day for his CHF, also has a history of COPD diabetes.  He denies fevers but has had a cough which is been productive.  No known COVID exposures.  No travel.  No calf pain or swelling.  Has not take anything for this.  Past Medical History:  Diagnosis Date  . Anemia   . Cancer ([place]) 12/2013   prostate  . Cardiogenic pulmonary edema ([place]) 12/19/2014  . Cardiomyopathy, ischemic   . CHF (congestive heart failure) ([place])   . COPD (chronic obstructive pulmonary disease) ([place])   . Depression   . Diabetes mellitus without complication ([place])   . GERD (gastroesophageal reflux disease)   . Gout   . Hypercholesteremia   . Hypertension   . Myocardial infarction ([place]) [id number]  . OSA on CPAP   . Shortness of breath dyspnea     Patient Active Problem List   Diagnosis Date Noted  . CAP (community acquired pneumonia) 05/25/2019  . CHF (congestive heart failure) ([place]) 01/08/2019  . 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 Unstable angina ([place])   . COPD exacerbation ([place]) 06/06/2018  . Acute on chronic systolic CHF (congestive heart failure) ([place]) 04/21/2017  . Hypokalemia 04/21/2017  . Hypomagnesemia 04/21/2017  . Essential hypertension 04/21/2017  . Hyperlipidemia 04/21/2017  . Coronary artery disease 04/21/2017  . COPD with chronic bronchitis ([place]) 02/13/2016  . Obstructive sleep apnea 02/13/2016  . Diabetes ([place]) 02/13/2016  .  Chronic systolic HF (heart failure) ([place]) 12/12/2015    Past Surgical History:  Procedure Laterality Date  . CARDIAC CATHETERIZATION N/A 02/02/2016   Procedure: Left Heart Cath and Coronary Angiography;  Surgeon: [name], MD;  Location: [facility];  Service: Cardiovascular;  Laterality: N/A;  . CORONARY ANGIOPLASTY WITH STENT PLACEMENT    . CORONARY ARTERY BYPASS GRAFT  11/24/2010  . CORONARY STENT INTERVENTION N/A 09/04/2018   Procedure: CORONARY STENT INTERVENTION;  Surgeon: [name], MD;  Location: [facility];  Service: Cardiovascular;  Laterality: N/A;  . IMPLANTABLE CARDIOVERTER DEFIBRILLATOR (ICD) GENERATOR CHANGE Left 12/12/2015   Procedure: DUAL LEAD PLACEMENT CARDIAC DIFIBRILLATOR;  Surgeon: [name], MD;  Location: [facility];  Service: Cardiovascular;  Laterality: Left;  . LEFT HEART CATH AND CORS/GRAFTS ANGIOGRAPHY N/A 09/04/2018   Procedure: LEFT HEART CATH AND CORS/GRAFTS ANGIOGRAPHY;  Surgeon: [name], MD;  Location: [facility];  Service: Cardiovascular;  Laterality: N/A;  . TONSILLECTOMY      Prior to Admission medications   Medication Sig Start Date End Date Taking? Authorizing Provider  acetaminophen (TYLENOL) 500 MG tablet Take 1,000 mg by mouth daily as needed for moderate pain.   Yes [provider]  albuterol (PROVENTIL HFA;VENTOLIN HFA) 108 (90 Base) MCG/ACT inhaler Inhale 2 puffs into the lungs every 6 (six) hours as needed for wheezing or shortness of breath. 06/04/18  Yes [name], [name], MD  allopurinol (ZYLOPRIM) 100 MG tablet Take 100 mg by mouth  daily.   Yes [provider]  aspirin 81 MG chewable tablet Chew 1 tablet (81 mg total) by mouth daily. 09/06/18  Yes Mayo, Pete [name], MD  budesonide-formoterol [facility]) 80-4.5 MCG/ACT inhaler Inhale 2 puffs into the lungs 2 (two) times daily.   Yes [provider]  carvedilol (COREG) 3.125 MG tablet Take 1 tablet (3.125 mg total) by  mouth 2 (two) times daily with a meal. 01/01/18  Yes Gladstone [name], MD  clopidogrel (PLAVIX) 75 MG tablet Take 75 mg by mouth daily.   Yes [provider]  CO-ENZYME Q10 PO Take 30 mg by mouth daily.    Yes [provider]  ferrous sulfate 325 (65 FE) MG tablet Take 325 mg by mouth 2 (two) times daily with a meal.   Yes [provider]  finasteride (PROSCAR) 5 MG tablet Take 5 mg by mouth daily.   Yes [provider]  FLUoxetine (PROZAC) 20 MG capsule Take 20 mg by mouth daily.   Yes [provider]  gabapentin (NEURONTIN) 300 MG capsule Take 300 mg by mouth 2 (two) times daily.    Yes [provider]  guaiFENesin (MUCINEX) 600 MG 12 hr tablet Take 600 mg by mouth 2 (two) times daily.   Yes [provider]  insulin glargine (LANTUS) 100 UNIT/ML injection Inject 0.8 mLs (80 Units total) into the skin at bedtime. Patient taking differently: Inject 60 Units into the skin at bedtime.  12/12/18  Yes Vaughan Basta, MD  insulin lispro (HUMALOG) 100 UNIT/ML KiwkPen Inject 0-15 Units into the skin 3 (three) times daily with meals. About 25-30 units total for the day   Yes [provider]  lansoprazole (PREVACID) 30 MG capsule Take 60 mg by mouth 2 (two) times daily.    Yes [provider]  loratadine (CLARITIN) 10 MG tablet Take 10 mg by mouth daily.   Yes [provider]  magnesium oxide (MAG-OX) 400 (241.3 Mg) MG tablet TAKE ONE TABLET BY MOUTH TWICE A DAY 03/06/19  Yes Hackney, Tina A, FNP  Multiple Vitamins-Minerals (CENTRUM SILVER PO) Take 1 tablet by mouth every morning.   Yes [provider]  Multiple Vitamins-Minerals (PRESERVISION AREDS 2) CAPS Take 1 capsule by mouth daily. For macular degeneration- eye vitamins    Yes [provider]  nitroGLYCERIN (NITROSTAT) 0.4 MG SL tablet Place 0.4 mg under the tongue every 5 (five) minutes as needed for chest pain.   Yes [provider]  ranolazine (RANEXA) 500 MG 12 hr tablet Take 1 tablet (500 mg total) by mouth 2 (two) times daily. 05/30/18  Yes Hackney, Otila Kluver A, FNP  rosuvastatin (CRESTOR) 20 MG tablet Take 20 mg by mouth daily.   Yes [provider]  tamsulosin (FLOMAX) 0.4 MG CAPS capsule Take 0.4 mg by mouth at bedtime.    Yes [provider]  tiotropium (SPIRIVA) 18 MCG inhalation capsule Place 1 capsule (18 mcg total) into inhaler and inhale daily. 06/08/18  Yes Wieting, Richard, MD  torsemide (DEMADEX) 20 MG tablet Take 20 mg by mouth 2 (two) times daily.    Yes [provider]  calcium carbonate (OS-CAL) 600 MG tablet Take 600 mg by mouth 2 (two) times daily.    [provider]  calcium carbonate (TUMS - DOSED IN MG ELEMENTAL CALCIUM) 500 MG chewable tablet Chew 1 tablet by mouth as needed for indigestion or heartburn.    [provider]     Allergies Brilinta [ticagrelor], Tuberculin tests,  Benadryl [diphenhydramine], Doxycycline, and Lopid [gemfibrozil]  Family History  Problem Relation Age of Onset  . CAD Mother   . Cancer Mother   . Diabetes Mother   . Alzheimer's disease Father   . Cancer Father   . Heart disease Father     Social History Social History   Tobacco Use  . Smoking status: Former Smoker    Packs/day: 2.00    Years: 30.00    Pack years: 60.00    Quit date: 12/03/1996    Years since quitting: 22.4  . Smokeless tobacco: Never Used  Substance Use Topics  . Alcohol use: No    Alcohol/week: 0.0 standard drinks  . Drug use: No    Review of Systems  Constitutional: No fever/chills Eyes: No visual changes.  ENT: No sore throat. Cardiovascular: Denies chest pain. Respiratory: As above Gastrointestinal: No abdominal pain.  No nausea, no vomiting.   Genitourinary: Negative for dysuria. Musculoskeletal: Negative for back pain. Skin: Negative for rash. Neurological: Negative for headaches    ____________________________________________    PHYSICAL EXAM:  VITAL SIGNS: ED Triage Vitals  Enc Vitals Group     BP 05/25/19 2016 117/64     Pulse Rate 05/25/19 2016 72     Resp 05/25/19 2016 (!) 24     Temp 05/25/19 2016 98.4 F (36.9 C)     Temp Source 05/25/19 2016 Oral     SpO2 05/25/19 2016 95 %     Weight 05/25/19 2017 99.8 kg (220 lb)     Height 05/25/19 2017 1.702 m (5\' 7" )     Head Circumference --      Peak Flow --      Pain Score 05/25/19 2017 0     Pain Loc --      Pain Edu? --      Excl. in [place]? --     Constitutional: Alert and oriented.  Eyes: Conjunctivae are normal.   Nose: No congestion/rhinnorhea. Mouth/Throat: Mucous membranes are moist.    Cardiovascular: Normal rate, regular rhythm. Grossly normal heart sounds.  Good peripheral circulation. Respiratory: Tachypnea with increased respiratory effort no retractions.  Bibasilar Rales Gastrointestinal: Soft and nontender. No distention.  No CVA tenderness. Genitourinary: deferred Musculoskeletal: No lower extremity tenderness nor edema.  Warm and well perfused Neurologic:  Normal speech and language. No gross focal neurologic deficits are appreciated.  Skin:  Skin is warm, dry and intact. No rash noted. Psychiatric: Mood and affect are normal. Speech and behavior are normal.  ____________________________________________   LABS (all labs ordered are listed, but only abnormal results are displayed)  Labs Reviewed  CBC WITH DIFFERENTIAL/PLATELET - Abnormal; Notable for the following components:      Result Value   RBC 3.56 (*)    Hemoglobin 9.9 (*)    HCT 31.3 (*)    RDW 17.9 (*)    Neutro Abs 8.5 (*)    Lymphs Abs 0.6 (*)    All other components within normal limits  COMPREHENSIVE METABOLIC PANEL - Abnormal; Notable for the following components:   Glucose, Bld 119 (*)    BUN 30 (*)    Creatinine, Ser 1.86 (*)    Calcium 8.7 (*)    GFR calc non Af Amer 35 (*)    GFR calc Af Amer 40 (*)    All other components within normal limits   TROPONIN I - Abnormal; Notable for the following components:   Troponin I 0.04 (*)    All other components within  normal limits  BRAIN NATRIURETIC PEPTIDE - Abnormal; Notable for the following components:   B Natriuretic Peptide 1,696.0 (*)    All other components within normal limits  SARS CORONAVIRUS 2 (HOSPITAL ORDER, [facility])  CBC  HIV ANTIBODY (ROUTINE TESTING W REFLEX)  CBC  CBC  BASIC METABOLIC PANEL   ____________________________________________  EKG  ED ECG REPORT I, Lavonia Drafts, the attending physician, personally viewed and interpreted this ECG.  Date: 05/25/2019  Rhythm: Paced rhythm QRS Axis: normal Intervals: normal ST/T Wave abnormalities: normal Narrative Interpretation: no evidence of acute ischemia  ____________________________________________  RADIOLOGY  Chest x-ray consistent with multifocal pneumonia ____________________________________________   PROCEDURES  Procedure(s) performed: No  Procedures   Critical Care performed: yes  CRITICAL CARE Performed by: Lavonia Drafts   Total critical care time: 30 minutes  Critical care time was exclusive of separately billable procedures and treating other patients.  Critical care was necessary to treat or prevent imminent or life-threatening deterioration.  Critical care was time spent personally by me on the following activities: development of treatment plan with patient and/or surrogate as well as nursing, discussions with consultants, evaluation of patient's response to treatment, examination of patient, obtaining history from patient or surrogate, ordering and performing treatments and interventions, ordering and review of laboratory studies, ordering and review of radiographic studies, pulse oximetry and re-evaluation of patient's condition.  ____________________________________________   INITIAL IMPRESSION / ASSESSMENT AND PLAN / ED COURSE  Pertinent labs &  imaging results that were available during my care of the patient were reviewed by me and considered in my medical decision making (see chart for details).  Presents with worsening shortness of breath is requiring significant nasal cannula oxygen to keep saturations above 89%.  Differential includes COVID-19, pneumonia, pulmonary edema, COPD.  Pending chest x-ray, labs  Chest x-ray consistent with multifocal pneumonia, white blood cell count is normal will cover with IV Rocephin, IV is a throat.  Pending COVID test.  Mild elevation in troponin likely related to strain  COVID test is negative, have discussed with the hospitalist for admission    ____________________________________________   FINAL CLINICAL IMPRESSION(S) / ED DIAGNOSES  Final diagnoses:  Community acquired pneumonia, unspecified laterality        Note:  This document was prepared using Dragon voice recognition software and may include unintentional dictation errors.   Lavonia Drafts, MD 05/25/19 [phone]

## 2019-05-25 NOTE — ED Triage Notes (Signed)
Pt with shob from home, pt is 90% on ra. Had a near syncopal episode. Pt with history of chf, states a pa came to house today and gave him 40mg  of lasix for "fluid".

## 2019-05-25 NOTE — ED Notes (Signed)
Date and time results received: 05/25/19 2105 (use smartphrase ".now" to insert current time)  Test: troponin Critical Value: 0.04  Name of Provider Notified: Corky Downs, MD  Orders Received? Or Actions Taken?: Orders Received - See Orders for details

## 2019-05-25 NOTE — ED Notes (Signed)
ED TO INPATIENT HANDOFF REPORT  ED Nurse Name and Phone #: [phone]   S Name/Age/Gender [name] 73 y.o. male Room/Bed: ED06A/ED06A  Code Status   Code Status: Full Code  Home/SNF/Other Home Patient oriented to: self, place, time and situation Is this baseline? Yes   Triage Complete: Triage complete  Chief Complaint Difficulty breathing  Triage Note Pt with shob from home, pt is 90% on ra. Had a near syncopal episode. Pt with history of chf, states a pa came to house today and gave him 40mg  of lasix for "fluid".    Allergies Allergies  Allergen Reactions  . Brilinta [Ticagrelor] Shortness Of Breath  . Tuberculin Tests Rash  . Benadryl [Diphenhydramine] Other (See Comments)    " Hyperactivity"  . Doxycycline Swelling    Pt went into pulmonary edema.  . Lopid [Gemfibrozil] Swelling    "I gain 1 pound a day for 30 days."    Level of Care/Admitting Diagnosis ED Disposition    ED Disposition Condition [place]: [facility] [100120]  Level of Care: Telemetry [5]  Covid Evaluation: Confirmed COVID Negative  Diagnosis: CAP (community acquired pneumonia) [725366]  Admitting Physician: Christel Mormon [[id number]]  Attending Physician: Christel Mormon [[id number]]  Estimated length of stay: 3 - 4 days  Certification:: I certify this patient will need inpatient services for at least 2 midnights  PT Class (Do Not Modify): Inpatient [101]  PT Acc Code (Do Not Modify): Private [1]       B Medical/Surgery History Past Medical History:  Diagnosis Date  . Anemia   . Cancer ([place]) 12/2013   prostate  . 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 Cardiomyopathy, ischemic   . CHF (congestive heart failure) ([place])   . COPD (chronic obstructive pulmonary disease) ([place])   . Depression   . Diabetes mellitus without complication ([place])   . GERD (gastroesophageal reflux disease)   . Gout   . Hypercholesteremia   . Hypertension    . Myocardial infarction ([place]) [id number]  . OSA on CPAP   . Shortness of breath dyspnea    Past Surgical History:  Procedure Laterality Date  . CARDIAC CATHETERIZATION N/A 02/02/2016   Procedure: Left Heart Cath and Coronary Angiography;  Surgeon: [name], MD;  Location: [facility];  Service: Cardiovascular;  Laterality: N/A;  . CORONARY ANGIOPLASTY WITH STENT PLACEMENT    . CORONARY ARTERY BYPASS GRAFT  11/24/2010  . CORONARY STENT INTERVENTION N/A 09/04/2018   Procedure: CORONARY STENT INTERVENTION;  Surgeon: [name], MD;  Location: [facility];  Service: Cardiovascular;  Laterality: N/A;  . IMPLANTABLE CARDIOVERTER DEFIBRILLATOR (ICD) GENERATOR CHANGE Left 12/12/2015   Procedure: DUAL LEAD PLACEMENT CARDIAC DIFIBRILLATOR;  Surgeon: [name], MD;  Location: [facility];  Service: Cardiovascular;  Laterality: Left;  . LEFT HEART CATH AND CORS/GRAFTS ANGIOGRAPHY N/A 09/04/2018   Procedure: LEFT HEART CATH AND CORS/GRAFTS ANGIOGRAPHY;  Surgeon: [name], MD;  Location: [facility];  Service: Cardiovascular;  Laterality: N/A;  . TONSILLECTOMY       A IV Location/Drains/Wounds Patient Lines/Drains/Airways Status   Active Line/Drains/Airways    Name:   Placement date:   Placement time:   Site:   Days:   Peripheral IV 05/25/19 Right Antecubital   05/25/19    2004    Antecubital   less than 1   Peripheral IV 05/25/19 Left Wrist   05/25/19  2145    Wrist   less than 1   Wound / Incision (Open or Dehisced) 12/10/18 Laceration Arm Left;Lower;Posterior appr. 2 inches X 2 inches    12/10/18    0058    Arm   166          Intake/Output Last 24 hours No intake or output data in the 24 hours ending 05/25/19 2336  Labs/Imaging Results for orders placed or performed during the hospital encounter of 05/25/19 (from the past 48 hour(s))  CBC with Differential     Status: Abnormal   Collection Time: 05/25/19  8:20 PM  Result Value Ref  Range   WBC 10.0 4.0 - 10.5 K/uL   RBC 3.56 (L) 4.22 - 5.81 MIL/uL   Hemoglobin 9.9 (L) 13.0 - 17.0 g/dL   HCT 31.3 (L) 39.0 - 52.0 %   MCV 87.9 80.0 - 100.0 fL   MCH 27.8 26.0 - 34.0 pg   MCHC 31.6 30.0 - 36.0 g/dL   RDW 17.9 (H) 11.5 - 15.5 %   Platelets 235 150 - 400 K/uL   nRBC 0.0 0.0 - 0.2 %   Neutrophils Relative % 85 %   Neutro Abs 8.5 (H) 1.7 - 7.7 K/uL   Lymphocytes Relative 6 %   Lymphs Abs 0.6 (L) 0.7 - 4.0 K/uL   Monocytes Relative 8 %   Monocytes Absolute 0.8 0.1 - 1.0 K/uL   Eosinophils Relative 1 %   Eosinophils Absolute 0.1 0.0 - 0.5 K/uL   Basophils Relative 0 %   Basophils Absolute 0.0 0.0 - 0.1 K/uL   Immature Granulocytes 0 %   Abs Immature Granulocytes 0.04 0.00 - 0.07 K/uL    Comment: Performed at [facility], [address] [place], [place] [address]  Comprehensive metabolic panel     Status: Abnormal   Collection Time: 05/25/19  8:20 PM  Result Value Ref Range   Sodium 137 135 - 145 mmol/L   Potassium 4.0 3.5 - 5.1 mmol/L   Chloride 98 98 - 111 mmol/L   CO2 26 22 - 32 mmol/L   Glucose, Bld 119 (H) 70 - 99 mg/dL   BUN 30 (H) 8 - 23 mg/dL   Creatinine, Ser 1.86 (H) 0.61 - 1.24 mg/dL   Calcium 8.7 (L) 8.9 - 10.3 mg/dL   Total Protein 7.1 6.5 - 8.1 g/dL   Albumin 3.7 3.5 - 5.0 g/dL   AST 22 15 - 41 U/L   ALT 13 0 - 44 U/L   Alkaline Phosphatase 90 38 - 126 U/L   Total Bilirubin 0.8 0.3 - 1.2 mg/dL   GFR calc non Af Amer 35 (L) >60 mL/min   GFR calc Af Amer 40 (L) >60 mL/min   Anion gap 13 5 - 15    Comment: Performed at [facility], [address] [place], [place] [address]  Troponin I - ONCE - STAT     Status: Abnormal   Collection Time: 05/25/19  8:20 PM  Result Value Ref Range   Troponin I 0.04 (HH) <0.03 ng/mL    Comment: CRITICAL RESULT CALLED TO, READ BACK BY AND VERIFIED WITH KATE BUCKNUM 05/25/19 @ 2106  [facility] Performed at [facility], [address] [place], [place] [address]   Brain natriuretic  peptide     Status: Abnormal   Collection Time: 05/25/19  8:20 PM  Result Value Ref Range   B Natriuretic Peptide 1,696.0 (H) 0.0 - 100.0 pg/mL  Comment: Performed at [facility], [address] [place], [place] [address]  SARS Coronavirus 2 (CEPHEID- Performed in [facility] hospital lab), Hosp Order     Status: None   Collection Time: 05/25/19  9:13 PM   Specimen: Nasopharyngeal Swab  Result Value Ref Range   SARS Coronavirus 2 NEGATIVE NEGATIVE    Comment: (NOTE) If result is NEGATIVE SARS-CoV-2 target nucleic acids are NOT DETECTED. The SARS-CoV-2 RNA is generally detectable in upper and lower  respiratory specimens during the acute phase of infection. The lowest  concentration of SARS-CoV-2 viral copies this assay can detect is 250  copies / mL. A negative result does not preclude SARS-CoV-2 infection  and should not be used as the sole basis for treatment or other  patient management decisions.  A negative result may occur with  improper specimen collection / handling, submission of specimen other  than nasopharyngeal swab, presence of viral mutation(s) within the  areas targeted by this assay, and inadequate number of viral copies  (<250 copies / mL). A negative result must be combined with clinical  observations, patient history, and epidemiological information. If result is POSITIVE SARS-CoV-2 target nucleic acids are DETECTED. The SARS-CoV-2 RNA is generally detectable in upper and lower  respiratory specimens dur ing the acute phase of infection.  Positive  results are indicative of active infection with SARS-CoV-2.  Clinical  correlation with patient history and other diagnostic information is  necessary to determine patient infection status.  Positive results do  not rule out bacterial infection or co-infection with other viruses. If result is PRESUMPTIVE POSTIVE SARS-CoV-2 nucleic acids MAY BE PRESENT.   A presumptive positive result was obtained on the  submitted specimen  and confirmed on repeat testing.  While 2019 novel coronavirus  (SARS-CoV-2) nucleic acids may be present in the submitted sample  additional confirmatory testing may be necessary for epidemiological  and / or clinical management purposes  to differentiate between  SARS-CoV-2 and other Sarbecovirus currently known to infect humans.  If clinically indicated additional testing with an alternate test  methodology [phone]) is advised. The SARS-CoV-2 RNA is generally  detectable in upper and lower respiratory sp ecimens during the acute  phase of infection. The expected result is Negative. Fact Sheet for Patients:  StrictlyIdeas.no Fact Sheet for Healthcare Providers: BankingDealers.co.za This test is not yet approved or cleared by the [place] FDA and has been authorized for detection and/or diagnosis of SARS-CoV-2 by FDA under an Emergency Use Authorization (EUA).  This EUA will remain in effect (meaning this test can be used) for the duration of the COVID-19 declaration under Section 564(b)(1) of the Act, 21 U.S.C. section 360bbb-3(b)(1), unless the authorization is terminated or revoked sooner. Performed at [facility], [address] [place], [place] [address]    Dg Chest Port 1 View  Result Date: 05/25/2019 CLINICAL DATA:  Shortness of breath. EXAM: PORTABLE CHEST 1 VIEW COMPARISON:  01/07/2019 FINDINGS: Post median sternotomy and CABG. Left-sided pacemaker is unchanged in position. Right middle and lower lobe confluent airspace opacity which silhouettes the hemidiaphragm. Possible associated pleural effusion. Patchy left infrahilar opacity and possible left pleural effusion. Mild cardiomegaly which appears similar to prior exam. Peribronchial thickening which may be pulmonary edema or bronchial. No pneumothorax. IMPRESSION: 1. Right middle and lower lobe confluent airspace opacity concerning for  pneumonia. Patchy left infrahilar opacity may be atelectasis or pneumonia. Suspect small pleural effusions. 2. Mild cardiomegaly. Peribronchial thickening may be pulmonary edema or chronic  bronchitis changes. Electronically Signed   By: Keith Rake M.D.   On: 05/25/2019 20:50    Pending Labs Unresulted Labs (From admission, onward)    Start     Ordered   05/26/19 0500  CBC  Tomorrow morning,   STAT     05/25/19 2330   05/26/19 3818  Basic metabolic panel  Tomorrow morning,   STAT     05/25/19 2330   05/25/19 2331  HIV antibody (Routine Screening)  Once,   STAT     05/25/19 2330   05/25/19 2331  CBC  (enoxaparin (LOVENOX)    CrCl >/= 30 ml/min)  Once,   STAT    Comments: Baseline for enoxaparin therapy IF NOT ALREADY DRAWN.  Notify MD if PLT < 100 K.    05/25/19 2330   05/25/19 2019  CBC  ONCE - STAT,   STAT     05/25/19 2019          Vitals/Pain Today's Vitals   05/25/19 2227 05/25/19 2230 05/25/19 2300 05/25/19 2330  BP: (!) 95/53 (!) 99/58 (!) 94/52 (!) 103/51  Pulse: 71 73 70 64  Resp: (!) 28 15 (!) 27 (!) 28  Temp:      TempSrc:      SpO2: 98% 96% 95% 97%  Weight:      Height:      PainSc:        Isolation Precautions Droplet and Contact precautions  Medications Medications  enoxaparin (LOVENOX) injection 40 mg (has no administration in time range)  sodium chloride flush (NS) 0.9 % injection 3 mL (has no administration in time range)  sodium chloride flush (NS) 0.9 % injection 3 mL (has no administration in time range)  0.9 %  sodium chloride infusion (has no administration in time range)  acetaminophen (TYLENOL) tablet 650 mg (has no administration in time range)    Or  acetaminophen (TYLENOL) suppository 650 mg (has no administration in time range)  traZODone (DESYREL) tablet 25 mg (has no administration in time range)  ondansetron (ZOFRAN) tablet 4 mg (has no administration in time range)    Or  ondansetron (ZOFRAN) injection 4 mg (has no administration  in time range)  cefTRIAXone (ROCEPHIN) 2 g in sodium chloride 0.9 % 100 mL IVPB (has no administration in time range)  azithromycin (ZITHROMAX) 500 mg in sodium chloride 0.9 % 250 mL IVPB (has no administration in time range)  guaiFENesin (MUCINEX) 12 hr tablet 600 mg (has no administration in time range)  cefTRIAXone (ROCEPHIN) 1 g in sodium chloride 0.9 % 100 mL IVPB (0 g Intravenous Stopped 05/25/19 2226)  azithromycin (ZITHROMAX) 500 mg in sodium chloride 0.9 % 250 mL IVPB (0 mg Intravenous Stopped 05/25/19 2333)  sodium chloride 0.9 % bolus 500 mL (500 mLs Intravenous New Bag/Given 05/25/19 2234)    Mobility walks Low fall risk   Focused Assessments Pulmonary Assessment Handoff:  Lung sounds:   O2 Device: Nasal Cannula O2 Flow Rate (L/min): 2 L/min      R Recommendations: See Admitting Provider Note  Report given to:   Additional Notes:

## 2019-05-25 NOTE — ED Notes (Signed)
ED TO INPATIENT HANDOFF REPORT  ED Nurse Name and Phone #: Bascom Levels Name/Age/Gender [name] 73 y.o. male Room/Bed: ED06A/ED06A  Code Status   Code Status: Full Code  Home/SNF/Other Home Patient oriented to: self, place, time and situation Is this baseline? Yes   Triage Complete: Triage complete  Chief Complaint Difficulty breathing  Triage Note Pt with shob from home, pt is 90% on ra. Had a near syncopal episode. Pt with history of chf, states a pa came to house today and gave him 40mg  of lasix for "fluid".    Allergies Allergies  Allergen Reactions  . Brilinta [Ticagrelor] Shortness Of Breath  . Tuberculin Tests Rash  . Benadryl [Diphenhydramine] Other (See Comments)    " Hyperactivity"  . Doxycycline Swelling    Pt went into pulmonary edema.  . Lopid [Gemfibrozil] Swelling    "I gain 1 pound a day for 30 days."    Level of Care/Admitting Diagnosis ED Disposition    ED Disposition Condition [place]: [facility] [[id number]]  Level of Care: Telemetry [5]  Covid Evaluation: Confirmed COVID Negative  Diagnosis: CAP (community acquired pneumonia) [458099]  Admitting Physician: Christel Mormon [[id number]]  Attending Physician: Christel Mormon [[id number]]  Estimated length of stay: 3 - 4 days  Certification:: I certify this patient will need inpatient services for at least 2 midnights  PT Class (Do Not Modify): Inpatient [101]  PT Acc Code (Do Not Modify): Private [1]       B Medical/Surgery History Past Medical History:  Diagnosis Date  . Anemia   . Cancer ([place]) 12/2013   prostate  . Cardiogenic pulmonary edema ([place]) 12/19/2014  . Cardiomyopathy, ischemic   . CHF (congestive heart failure) ([place])   . COPD (chronic obstructive pulmonary disease) ([place])   . Depression   . Diabetes mellitus without complication ([place])   . GERD (gastroesophageal reflux disease)   . Gout   . Hypercholesteremia   . Hypertension   .  Myocardial infarction ([place]) [id number]  . OSA on CPAP   . Shortness of breath dyspnea    Past Surgical History:  Procedure Laterality Date  . CARDIAC CATHETERIZATION N/A 02/02/2016   Procedure: Left Heart Cath and Coronary Angiography;  Surgeon: [name], MD;  Location: [facility];  Service: Cardiovascular;  Laterality: N/A;  . CORONARY ANGIOPLASTY WITH STENT PLACEMENT    . CORONARY ARTERY BYPASS GRAFT  11/24/2010  . CORONARY STENT INTERVENTION N/A 09/04/2018   Procedure: CORONARY STENT INTERVENTION;  Surgeon: [name], MD;  Location: [facility];  Service: Cardiovascular;  Laterality: N/A;  . IMPLANTABLE CARDIOVERTER DEFIBRILLATOR (ICD) GENERATOR CHANGE Left 12/12/2015   Procedure: DUAL LEAD PLACEMENT CARDIAC DIFIBRILLATOR;  Surgeon: [name], MD;  Location: [facility];  Service: Cardiovascular;  Laterality: Left;  . LEFT HEART CATH AND CORS/GRAFTS ANGIOGRAPHY N/A 09/04/2018   Procedure: LEFT HEART CATH AND CORS/GRAFTS ANGIOGRAPHY;  Surgeon: [name], MD;  Location: [facility];  Service: Cardiovascular;  Laterality: N/A;  . TONSILLECTOMY       A IV Location/Drains/Wounds Patient Lines/Drains/Airways Status   Active Line/Drains/Airways    Name:   Placement date:   Placement time:   Site:   Days:   Peripheral IV 05/25/19 Right Antecubital   05/25/19    2004    Antecubital   less than 1   Peripheral IV 05/25/19 Left Wrist   05/25/19  2145    Wrist   less than 1   Wound / Incision (Open or Dehisced) 12/10/18 Laceration Arm Left;Lower;Posterior appr. 2 inches X 2 inches    12/10/18    0058    Arm   166          Intake/Output Last 24 hours No intake or output data in the 24 hours ending 05/25/19 2333  Labs/Imaging Results for orders placed or performed during the hospital encounter of 05/25/19 (from the past 48 hour(s))  CBC with Differential     Status: Abnormal   Collection Time: 05/25/19  8:20 PM  Result Value Ref Range    WBC 10.0 4.0 - 10.5 K/uL   RBC 3.56 (L) 4.22 - 5.81 MIL/uL   Hemoglobin 9.9 (L) 13.0 - 17.0 g/dL   HCT 31.3 (L) 39.0 - 52.0 %   MCV 87.9 80.0 - 100.0 fL   MCH 27.8 26.0 - 34.0 pg   MCHC 31.6 30.0 - 36.0 g/dL   RDW 17.9 (H) 11.5 - 15.5 %   Platelets 235 150 - 400 K/uL   nRBC 0.0 0.0 - 0.2 %   Neutrophils Relative % 85 %   Neutro Abs 8.5 (H) 1.7 - 7.7 K/uL   Lymphocytes Relative 6 %   Lymphs Abs 0.6 (L) 0.7 - 4.0 K/uL   Monocytes Relative 8 %   Monocytes Absolute 0.8 0.1 - 1.0 K/uL   Eosinophils Relative 1 %   Eosinophils Absolute 0.1 0.0 - 0.5 K/uL   Basophils Relative 0 %   Basophils Absolute 0.0 0.0 - 0.1 K/uL   Immature Granulocytes 0 %   Abs Immature Granulocytes 0.04 0.00 - 0.07 K/uL    Comment: Performed at [facility], [address] [place], [place] [address]  Comprehensive metabolic panel     Status: Abnormal   Collection Time: 05/25/19  8:20 PM  Result Value Ref Range   Sodium 137 135 - 145 mmol/L   Potassium 4.0 3.5 - 5.1 mmol/L   Chloride 98 98 - 111 mmol/L   CO2 26 22 - 32 mmol/L   Glucose, Bld 119 (H) 70 - 99 mg/dL   BUN 30 (H) 8 - 23 mg/dL   Creatinine, Ser 1.86 (H) 0.61 - 1.24 mg/dL   Calcium 8.7 (L) 8.9 - 10.3 mg/dL   Total Protein 7.1 6.5 - 8.1 g/dL   Albumin 3.7 3.5 - 5.0 g/dL   AST 22 15 - 41 U/L   ALT 13 0 - 44 U/L   Alkaline Phosphatase 90 38 - 126 U/L   Total Bilirubin 0.8 0.3 - 1.2 mg/dL   GFR calc non Af Amer 35 (L) >60 mL/min   GFR calc Af Amer 40 (L) >60 mL/min   Anion gap 13 5 - 15    Comment: Performed at [facility], [address] [place], [place] [address]  Troponin I - ONCE - STAT     Status: Abnormal   Collection Time: 05/25/19  8:20 PM  Result Value Ref Range   Troponin I 0.04 (HH) <0.03 ng/mL    Comment: CRITICAL RESULT CALLED TO, READ BACK BY AND VERIFIED WITH KATE Jetson Pickrel 05/25/19 @ 2106  [facility] Performed at [facility], [address] [place], [place] [address]   Brain natriuretic peptide      Status: Abnormal   Collection Time: 05/25/19  8:20 PM  Result Value Ref Range   B Natriuretic Peptide 1,696.0 (H) 0.0 - 100.0 pg/mL  Comment: Performed at [facility], [address] [place], [place] [address]  SARS Coronavirus 2 (CEPHEID- Performed in [facility] hospital lab), Hosp Order     Status: None   Collection Time: 05/25/19  9:13 PM   Specimen: Nasopharyngeal Swab  Result Value Ref Range   SARS Coronavirus 2 NEGATIVE NEGATIVE    Comment: (NOTE) If result is NEGATIVE SARS-CoV-2 target nucleic acids are NOT DETECTED. The SARS-CoV-2 RNA is generally detectable in upper and lower  respiratory specimens during the acute phase of infection. The lowest  concentration of SARS-CoV-2 viral copies this assay can detect is 250  copies / mL. A negative result does not preclude SARS-CoV-2 infection  and should not be used as the sole basis for treatment or other  patient management decisions.  A negative result may occur with  improper specimen collection / handling, submission of specimen other  than nasopharyngeal swab, presence of viral mutation(s) within the  areas targeted by this assay, and inadequate number of viral copies  (<250 copies / mL). A negative result must be combined with clinical  observations, patient history, and epidemiological information. If result is POSITIVE SARS-CoV-2 target nucleic acids are DETECTED. The SARS-CoV-2 RNA is generally detectable in upper and lower  respiratory specimens dur ing the acute phase of infection.  Positive  results are indicative of active infection with SARS-CoV-2.  Clinical  correlation with patient history and other diagnostic information is  necessary to determine patient infection status.  Positive results do  not rule out bacterial infection or co-infection with other viruses. If result is PRESUMPTIVE POSTIVE SARS-CoV-2 nucleic acids MAY BE PRESENT.   A presumptive positive result was obtained on the submitted  specimen  and confirmed on repeat testing.  While 2019 novel coronavirus  (SARS-CoV-2) nucleic acids may be present in the submitted sample  additional confirmatory testing may be necessary for epidemiological  and / or clinical management purposes  to differentiate between  SARS-CoV-2 and other Sarbecovirus currently known to infect humans.  If clinically indicated additional testing with an alternate test  methodology [phone]) is advised. The SARS-CoV-2 RNA is generally  detectable in upper and lower respiratory sp ecimens during the acute  phase of infection. The expected result is Negative. Fact Sheet for Patients:  StrictlyIdeas.no Fact Sheet for Healthcare Providers: BankingDealers.co.za This test is not yet approved or cleared by the [place] FDA and has been authorized for detection and/or diagnosis of SARS-CoV-2 by FDA under an Emergency Use Authorization (EUA).  This EUA will remain in effect (meaning this test can be used) for the duration of the COVID-19 declaration under Section 564(b)(1) of the Act, 21 U.S.C. section 360bbb-3(b)(1), unless the authorization is terminated or revoked sooner. Performed at [facility], [address], [place], [place] [address]    Dg Chest Port 1 View  Result Date: 05/25/2019 CLINICAL DATA:  Shortness of breath. EXAM: PORTABLE CHEST 1 VIEW COMPARISON:  01/07/2019 FINDINGS: Post median sternotomy and CABG. Left-sided pacemaker is unchanged in position. Right middle and lower lobe confluent airspace opacity which silhouettes the hemidiaphragm. Possible associated pleural effusion. Patchy left infrahilar opacity and possible left pleural effusion. Mild cardiomegaly which appears similar to prior exam. Peribronchial thickening which may be pulmonary edema or bronchial. No pneumothorax. IMPRESSION: 1. Right middle and lower lobe confluent airspace opacity concerning for pneumonia. Patchy  left infrahilar opacity may be atelectasis or pneumonia. Suspect small pleural effusions. 2. Mild cardiomegaly. Peribronchial thickening may be pulmonary edema or chronic  bronchitis changes. Electronically Signed   By: Keith Rake M.D.   On: 05/25/2019 20:50    Pending Labs Unresulted Labs (From admission, onward)    Start     Ordered   05/26/19 0500  CBC  Tomorrow morning,   STAT     05/25/19 2330   05/26/19 2694  Basic metabolic panel  Tomorrow morning,   STAT     05/25/19 2330   05/25/19 2331  HIV antibody (Routine Screening)  Once,   STAT     05/25/19 2330   05/25/19 2331  CBC  (enoxaparin (LOVENOX)    CrCl >/= 30 ml/min)  Once,   STAT    Comments: Baseline for enoxaparin therapy IF NOT ALREADY DRAWN.  Notify MD if PLT < 100 K.    05/25/19 2330   05/25/19 2019  CBC  ONCE - STAT,   STAT     05/25/19 2019          Vitals/Pain Today's Vitals   05/25/19 2227 05/25/19 2230 05/25/19 2300 05/25/19 2330  BP: (!) 95/53 (!) 99/58 (!) 94/52 (!) 103/51  Pulse: 71 73 70 64  Resp: (!) 28 15 (!) 27 (!) 28  Temp:      TempSrc:      SpO2: 98% 96% 95% 97%  Weight:      Height:      PainSc:        Isolation Precautions Droplet and Contact precautions  Medications Medications  enoxaparin (LOVENOX) injection 40 mg (has no administration in time range)  sodium chloride flush (NS) 0.9 % injection 3 mL (has no administration in time range)  sodium chloride flush (NS) 0.9 % injection 3 mL (has no administration in time range)  0.9 %  sodium chloride infusion (has no administration in time range)  acetaminophen (TYLENOL) tablet 650 mg (has no administration in time range)    Or  acetaminophen (TYLENOL) suppository 650 mg (has no administration in time range)  traZODone (DESYREL) tablet 25 mg (has no administration in time range)  ondansetron (ZOFRAN) tablet 4 mg (has no administration in time range)    Or  ondansetron (ZOFRAN) injection 4 mg (has no administration in time range)   cefTRIAXone (ROCEPHIN) 2 g in sodium chloride 0.9 % 100 mL IVPB (has no administration in time range)  azithromycin (ZITHROMAX) 500 mg in sodium chloride 0.9 % 250 mL IVPB (has no administration in time range)  guaiFENesin (MUCINEX) 12 hr tablet 600 mg (has no administration in time range)  cefTRIAXone (ROCEPHIN) 1 g in sodium chloride 0.9 % 100 mL IVPB (0 g Intravenous Stopped 05/25/19 2226)  azithromycin (ZITHROMAX) 500 mg in sodium chloride 0.9 % 250 mL IVPB (0 mg Intravenous Stopped 05/25/19 2333)  sodium chloride 0.9 % bolus 500 mL (500 mLs Intravenous New Bag/Given 05/25/19 2234)    Mobility walks Low fall risk   Focused Assessments Cardiac Assessment Handoff:  Cardiac Rhythm: A-V Sequential paced Lab Results  Component Value Date   CKTOTAL 124 12/11/2014   CKMB 2.0 12/11/2014   TROPONINI 0.04 (HH) 05/25/2019   No results found for: DDIMER Does the Patient currently have chest pain? No     R Recommendations: See Admitting Provider Note  Report given to:   Additional Notes:

## 2019-05-26 ENCOUNTER — Other Ambulatory Visit: Payer: Self-pay

## 2019-05-26 LAB — CBC
HCT: 29.6 % — ABNORMAL LOW (ref 39.0–52.0)
HCT: 29.9 % — ABNORMAL LOW (ref 39.0–52.0)
Hemoglobin: 9.2 g/dL — ABNORMAL LOW (ref 13.0–17.0)
Hemoglobin: 9.2 g/dL — ABNORMAL LOW (ref 13.0–17.0)
MCH: 27.9 pg (ref 26.0–34.0)
MCH: 27.5 pg (ref 26.0–34.0)
MCHC: 31.1 g/dL (ref 30.0–36.0)
MCHC: 30.8 g/dL (ref 30.0–36.0)
MCV: 89.7 fL (ref 80.0–100.0)
MCV: 89.5 fL (ref 80.0–100.0)
Platelets: 230 10*3/uL (ref 150–400)
Platelets: 227 10*3/uL (ref 150–400)
RBC: 3.3 MIL/uL — ABNORMAL LOW (ref 4.22–5.81)
RBC: 3.34 MIL/uL — ABNORMAL LOW (ref 4.22–5.81)
RDW: 18.1 % — ABNORMAL HIGH (ref 11.5–15.5)
RDW: 18 % — ABNORMAL HIGH (ref 11.5–15.5)
WBC: 8.9 10*3/uL (ref 4.0–10.5)
WBC: 8.1 10*3/uL (ref 4.0–10.5)
nRBC: 0 % (ref 0.0–0.2)
nRBC: 0 % (ref 0.0–0.2)

## 2019-05-26 LAB — TROPONIN I
Troponin I: 0.04 ng/mL (ref <0.03)
Troponin I: 0.03 ng/mL (ref <0.03)

## 2019-05-26 LAB — BASIC METABOLIC PANEL
Anion gap: 11 (ref 5–15)
BUN: 32 mg/dL — ABNORMAL HIGH (ref 8–23)
CO2: 26 mmol/L (ref 22–32)
Calcium: 8.3 mg/dL — ABNORMAL LOW (ref 8.9–10.3)
Chloride: 101 mmol/L (ref 98–111)
Creatinine, Ser: 1.92 mg/dL — ABNORMAL HIGH (ref 0.61–1.24)
GFR calc Af Amer: 39 mL/min — ABNORMAL LOW (ref >60)
GFR calc non Af Amer: 34 mL/min — ABNORMAL LOW (ref >60)
Glucose, Bld: 112 mg/dL — ABNORMAL HIGH (ref 70–99)
Potassium: 4.3 mmol/L (ref 3.5–5.1)
Sodium: 138 mmol/L (ref 135–145)

## 2019-05-26 LAB — GLUCOSE, CAPILLARY
Glucose-Capillary: 95 mg/dL (ref 70–99)
Glucose-Capillary: 117 mg/dL — ABNORMAL HIGH (ref 70–99)
Glucose-Capillary: 119 mg/dL — ABNORMAL HIGH (ref 70–99)
Glucose-Capillary: 138 mg/dL — ABNORMAL HIGH (ref 70–99)

## 2019-05-26 LAB — TSH: TSH: 2.38 u[IU]/mL (ref 0.350–4.500)

## 2019-05-26 LAB — MAGNESIUM: Magnesium: 2.7 mg/dL — ABNORMAL HIGH (ref 1.7–2.4)

## 2019-05-26 MED ORDER — FUROSEMIDE 10 MG/ML IJ SOLN
20.0000 mg | Freq: Two times a day (BID) | INTRAMUSCULAR | Status: DC
  Administered 2019-05-26: 20 mg via INTRAVENOUS
  Filled 2019-05-26: qty 2

## 2019-05-26 MED ORDER — INSULIN ASPART 100 UNIT/ML ~~LOC~~ SOLN: 0.0000 [IU] | Freq: Three times a day (TID) | SUBCUTANEOUS | Status: DC

## 2019-05-26 MED ORDER — FUROSEMIDE 10 MG/ML IJ SOLN
20.0000 mg | Freq: Two times a day (BID) | INTRAMUSCULAR | Status: DC
  Administered 2019-05-26 – 2019-05-28 (×4): 20 mg via INTRAVENOUS
  Filled 2019-05-26 (×4): qty 2

## 2019-05-26 MED ORDER — AZITHROMYCIN 500 MG PO TABS
500.0000 mg | ORAL_TABLET | Freq: Every day | ORAL | Status: AC
  Administered 2019-05-27 – 2019-05-30 (×4): 500 mg via ORAL
  Filled 2019-05-26 (×4): qty 1

## 2019-05-26 MED ORDER — IPRATROPIUM-ALBUTEROL 0.5-2.5 (3) MG/3ML IN SOLN
3.0000 mL | RESPIRATORY_TRACT | Status: DC | PRN
  Administered 2019-05-26 – 2019-05-27 (×3): 3 mL via RESPIRATORY_TRACT
  Filled 2019-05-26 (×3): qty 3

## 2019-05-26 MED ORDER — SODIUM CHLORIDE 0.9% FLUSH
3.0000 mL | Freq: Two times a day (BID) | INTRAVENOUS | Status: DC
  Administered 2019-05-26 – 2019-05-29 (×7): 3 mL via INTRAVENOUS

## 2019-05-26 NOTE — Plan of Care (Signed)
°  Problem: Respiratory: °Goal: Ability to maintain adequate ventilation will improve °Outcome: Progressing °  °

## 2019-05-26 NOTE — Plan of Care (Signed)
  Problem: Respiratory: Goal: Ability to maintain adequate ventilation will improve Outcome: Progressing   Problem: Clinical Measurements: Goal: Respiratory complications will improve Outcome: Progressing

## 2019-05-26 NOTE — H&P (Signed)
[facility] [place] NAME: [name]    MR#:  [id number]  DATE OF BIRTH:  09-11-1946  DATE OF ADMISSION:  05/25/2019  PRIMARY CARE PHYSICIAN: [name], MD   REQUESTING/REFERRING PHYSICIAN: Lavonia Drafts, MD CHIEF COMPLAINT:   Chief Complaint  Patient presents with   Shortness of Breath    HISTORY OF PRESENT ILLNESS:  [name]  is a 73 y.o. Caucasian male with a known history of multiple medical problems that will be mentioned below, who presented to the emergency room with acute onset of worsening dyspnea with associated dry cough and shaking chills for the last few days.  He denied any chest pain or palpitations.  No nausea vomiting or abdominal pain.  No diarrhea or melena approximately per rectum.  Admitted to worsening lower extremity edema as well as paroxysmal nocturnal dyspnea and has been having dyspnea on exertion.  He denies any significantly worsening orthopnea.  Upon presentation to the emergency room, blood pressure was 113/59 with respiratory therapist 24 and pulse oximetry of 9698% on 2 L of O2 by nasal cannula.  Labs were remarkable for a BUN of 30 and creatinine of 1.86 and have BNP of 1696 with troponin I 0.04 and anemia with hemoglobin of 9.9 hematocrit 31.3.  EKG showed atrial sensed ventricular paced rhythm with a rate of 73.  Portable chest x-ray showed mild cardiomegaly with peribronchial thickening concerning for pulmonary edema as well as right middle lobe and lower lobe airspace disease concerning for pneumonia with patchy left-sided infrahilar opacity that may be atelectasis or pneumonia.  COVID-19 test came back negative.  The patient was given IV Rocephin and Zithromax was 500 mL IV normal saline bolus especially when he get mildly hypotensive.  He will be admitted to a telemetry bed for further evaluation and management.  PAST MEDICAL HISTORY:   Past Medical History:  Diagnosis Date    Anemia    Cancer ([place]) 12/2013   prostate   Cardiogenic pulmonary edema ([place]) 12/19/2014   Cardiomyopathy, ischemic    CHF (congestive heart failure) (HCC)    COPD (chronic obstructive pulmonary disease) (HCC)    Depression    Diabetes mellitus without complication (HCC)    GERD (gastroesophageal reflux disease)    Gout    Hypercholesteremia    Hypertension    Myocardial infarction ([place]) [id number]   OSA on CPAP    Shortness of breath dyspnea     PAST SURGICAL HISTORY:   Past Surgical History:  Procedure Laterality Date   CARDIAC CATHETERIZATION N/A 02/02/2016   Procedure: Left Heart Cath and Coronary Angiography;  Surgeon: [name], MD;  Location: [facility];  Service: Cardiovascular;  Laterality: N/A;   CORONARY ANGIOPLASTY WITH STENT PLACEMENT     CORONARY ARTERY BYPASS GRAFT  11/24/2010   CORONARY STENT INTERVENTION N/A 09/04/2018   Procedure: CORONARY STENT INTERVENTION;  Surgeon: [name], MD;  Location: [facility];  Service: Cardiovascular;  Laterality: N/A;   IMPLANTABLE CARDIOVERTER DEFIBRILLATOR (ICD) GENERATOR CHANGE Left 12/12/2015   Procedure: DUAL LEAD PLACEMENT CARDIAC DIFIBRILLATOR;  Surgeon: [name], MD;  Location: [facility];  Service: Cardiovascular;  Laterality: Left;   LEFT HEART CATH AND CORS/GRAFTS ANGIOGRAPHY N/A 09/04/2018   Procedure: LEFT HEART CATH AND CORS/GRAFTS ANGIOGRAPHY;  Surgeon: [name], MD;  Location: [facility];  Service: Cardiovascular;  Laterality: N/A;   TONSILLECTOMY      SOCIAL HISTORY:  Social History   Tobacco Use   Smoking status: Former Smoker    Packs/day: 2.00    Years: 30.00    Pack years: 60.00    Quit date: 12/03/1996    Years since quitting: 22.4   Smokeless tobacco: Never Used  Substance Use Topics   Alcohol use: No    Alcohol/week: 0.0 standard drinks    FAMILY HISTORY:   Family History  Problem Relation Age of Onset   CAD  Mother    Cancer Mother    Diabetes Mother    Alzheimer's disease Father    Cancer Father    Heart disease Father     DRUG ALLERGIES:   Allergies  Allergen Reactions   Brilinta [Ticagrelor] Shortness Of Breath   Tuberculin Tests Rash   Benadryl [Diphenhydramine] Other (See Comments)    " Hyperactivity"   Doxycycline Swelling    Pt went into pulmonary edema.   Lopid [Gemfibrozil] Swelling    "I gain 1 pound a day for 30 days."    REVIEW OF SYSTEMS:   ROS As per history of present illness. All pertinent systems were reviewed above. Constitutional,  HEENT, cardiovascular, respiratory, GI, GU, musculoskeletal, neuro, psychiatric, endocrine,  integumentary and hematologic systems were reviewed and are otherwise  negative/unremarkable except for positive findings mentioned above in the HPI.   MEDICATIONS AT HOME:   Prior to Admission medications   Medication Sig Start Date End Date Taking? Authorizing Provider  acetaminophen (TYLENOL) 500 MG tablet Take 1,000 mg by mouth daily as needed for moderate pain.   Yes [provider]  albuterol (PROVENTIL HFA;VENTOLIN HFA) 108 (90 Base) MCG/ACT inhaler Inhale 2 puffs into the lungs every 6 (six) hours as needed for wheezing or shortness of breath. 06/04/18  Yes [name], [name], MD  allopurinol (ZYLOPRIM) 100 MG tablet Take 100 mg by mouth daily.   Yes [provider]  aspirin 81 MG chewable tablet Chew 1 tablet (81 mg total) by mouth daily. 09/06/18  Yes Mayo, Pete [name], MD  budesonide-formoterol [facility]) 80-4.5 MCG/ACT inhaler Inhale 2 puffs into the lungs 2 (two) times daily.   Yes [provider]  carvedilol (COREG) 3.125 MG tablet Take 1 tablet (3.125 mg total) by mouth 2 (two) times daily with a meal. 01/01/18  Yes Gladstone [name], MD  clopidogrel (PLAVIX) 75 MG tablet Take 75 mg by mouth daily.   Yes [provider]  CO-ENZYME Q10 PO Take 30 mg by mouth daily.    Yes [provider]  ferrous sulfate 325 (65 FE) MG tablet Take 325 mg by mouth 2 (two) times daily with a meal.   Yes [provider]  finasteride (PROSCAR) 5 MG tablet Take 5 mg by mouth daily.   Yes [provider]  FLUoxetine (PROZAC) 20 MG capsule Take 20 mg by mouth daily.   Yes [provider]  gabapentin (NEURONTIN) 300 MG capsule Take 300 mg by mouth 2 (two) times daily.    Yes [provider]  guaiFENesin (MUCINEX) 600 MG 12 hr tablet Take 600 mg by mouth 2 (two) times daily.   Yes [provider]  insulin glargine (LANTUS) 100 UNIT/ML injection Inject 0.8 mLs (80 Units total) into the skin at bedtime. Patient taking differently: Inject 60 Units into the skin at bedtime.  12/12/18  Yes Vaughan Basta, MD  insulin lispro (HUMALOG) 100 UNIT/ML KiwkPen Inject 0-15 Units into the skin 3 (three) times daily with meals. About 25-30 units total for the  day   Yes [provider]  lansoprazole (PREVACID) 30 MG capsule Take 60 mg by mouth 2 (two) times daily.    Yes [provider]  loratadine (CLARITIN) 10 MG tablet Take 10 mg by mouth daily.   Yes [provider]  magnesium oxide (MAG-OX) 400 (241.3 Mg) MG tablet TAKE ONE TABLET BY MOUTH TWICE A DAY 03/06/19  Yes Hackney, Tina A, FNP  Multiple Vitamins-Minerals (CENTRUM SILVER PO) Take 1 tablet by mouth every morning.   Yes [provider]  Multiple Vitamins-Minerals (PRESERVISION AREDS 2) CAPS Take 1 capsule by mouth daily. For macular degeneration- eye vitamins    Yes [provider]  nitroGLYCERIN (NITROSTAT) 0.4 MG SL tablet Place 0.4 mg under the tongue every 5 (five) minutes as needed for chest pain.   Yes [provider]  ranolazine (RANEXA) 500 MG 12 hr tablet Take 1 tablet (500 mg total) by mouth 2 (two) times daily. 05/30/18  Yes Hackney, Otila Kluver A, FNP  rosuvastatin (CRESTOR) 20 MG tablet Take 20 mg by mouth daily.   Yes [provider]  tamsulosin (FLOMAX) 0.4 MG CAPS capsule Take 0.4 mg by mouth at bedtime.    Yes [provider]  tiotropium (SPIRIVA) 18 MCG inhalation capsule Place 1 capsule (18 mcg total) into inhaler and inhale daily. 06/08/18  Yes Wieting, Richard, MD  torsemide (DEMADEX) 20 MG tablet Take 20 mg by mouth 2 (two) times daily.    Yes [provider]  calcium carbonate (OS-CAL) 600 MG tablet Take 600 mg by mouth 2 (two) times daily.    [provider]  calcium carbonate (TUMS - DOSED IN MG ELEMENTAL CALCIUM) 500 MG chewable tablet Chew 1 tablet by mouth as needed for indigestion or heartburn.    [provider]      VITAL SIGNS:  Blood pressure (!) 103/51, pulse 69, temperature 98.4 F (36.9 C), temperature source Oral, resp. rate (!) 22, height 5\' 7"  (1.702 m), weight 99.8 kg, SpO2 99 %.  PHYSICAL EXAMINATION:  Physical Exam  GENERAL:  73 y.o.-year-old Caucasian male patient lying in the bed in mild respiratory distress with conversational dyspnea eYES: Pupils equal, round, reactive to light and accommodation. No scleral icterus. Extraocular muscles intact.  HEENT: Head atraumatic, normocephalic. Oropharynx and nasopharynx clear.  NECK:  Supple, no jugular venous distention. No thyroid enlargement, no tenderness.  LUNGS: Diminished bibasilar breath sounds with bibasal crackles. CARDIOVASCULAR: Regular rate and rhythm, S1, S2 normal. No murmurs, rubs, or gallops.  ABDOMEN: Soft, nondistended, nontender. Bowel sounds present. No organomegaly or mass.  EXTREMITIES: Trace bilateral lower extremity pitting edema, with no cyanosis, or clubbing.  NEUROLOGIC: Cranial nerves II through XII are intact. Muscle strength 5/5 in all extremities. Sensation intact. Gait not checked.  PSYCHIATRIC: The patient is alert and oriented x 3.  Normal affect and good eye contact. SKIN: No obvious rash, lesion, or ulcer.   LABORATORY PANEL:   CBC Recent Labs  Lab 05/25/19 2020  WBC  10.0  HGB 9.9*  HCT 31.3*  PLT 235   ------------------------------------------------------------------------------------------------------------------  Chemistries  Recent Labs  Lab 05/25/19 2020  NA 137  K 4.0  CL 98  CO2 26  GLUCOSE 119*  BUN 30*  CREATININE 1.86*  CALCIUM 8.7*  AST 22  ALT 13  ALKPHOS 90  BILITOT 0.8   ------------------------------------------------------------------------------------------------------------------  Cardiac Enzymes Recent Labs  Lab 05/25/19 2020  TROPONINI 0.04*   ------------------------------------------------------------------------------------------------------------------  RADIOLOGY:  Dg Chest Port 1 [address]  Result Date: 05/25/2019 CLINICAL DATA:  Shortness of breath. EXAM: PORTABLE CHEST 1 VIEW COMPARISON:  01/07/2019 FINDINGS: Post median sternotomy and CABG. Left-sided pacemaker is unchanged in position. Right middle and lower lobe confluent airspace opacity which silhouettes the hemidiaphragm. Possible associated pleural effusion. Patchy left infrahilar opacity and possible left pleural effusion. Mild cardiomegaly which appears similar to prior exam. Peribronchial thickening which may be pulmonary edema or bronchial. No pneumothorax. IMPRESSION: 1. Right middle and lower lobe confluent airspace opacity concerning for pneumonia. Patchy left infrahilar opacity may be atelectasis or pneumonia. Suspect small pleural effusions. 2. Mild cardiomegaly. Peribronchial thickening may be pulmonary edema or chronic bronchitis changes. Electronically Signed   By: Keith Rake M.D.   On: 05/25/2019 20:50      IMPRESSION AND PLAN:   1.  Community-acquired pneumonia.The patient will be admitted to a medically monitored telemetry bed for community-acquired pneumonia and will be placed on IV Rocephin and Zithromax.  Mucolytic therapy be provided as well as duo nebs q.i.d. and q.4 hours p.r.n.Marland Kitchen  Sputum Gram stain culture and sensitivity will be  obtained.  2.  Acute on top of chronic systolic CHF.  The patient last 2D echo in September of last year revealed EF of 20% with mild left and right atrial dilatation and moderate tricuspid regurgitation.  The patient will be cautiously diuresed with IV Lasix given his borderline blood pressure.  Will follow serial troponin I.  Will check TSH and magnesium level and follow his BNP.  3.  Hypertension.  Will hold antihypertensives for hypotension.  4.  Coronary artery disease.  We will continue statin therapy, Coreg, aspirin and Plavix.  5.  Gout.  We will continue allopurinol  6.  COPD.  We will hold off Symbicort and place the patient on duo nebs.  7.  BPH.  Flomax will be resumed.  8.  Type 2 diabetes mellitus.  Patient will be placed on supplemental coverage with NovoLog and we will continue basal coverage.  9.  DVT prophylaxis.  Subcutaneous Lovenox.   All the records are reviewed and case discussed with ED provider. The plan of care was discussed in details with the patient (and family). I answered all questions. The patient agreed to proceed with the above mentioned plan. Further management will depend upon hospital course.   CODE STATUS: Full code  TOTAL TIME TAKING CARE OF THIS PATIENT: 15minutes.    Christel Mormon M.D on 05/26/2019 at 12:02 AM  Pager - [phone]  After 6pm go to [email or web address] - Proofreader  [facility]  Office  [phone]  CC: Primary care physician; [name], MD   Note: This dictation was prepared with Dragon dictation along with smaller phrase technology. Any transcriptional errors that result from this process are unintentional.

## 2019-05-26 NOTE — Progress Notes (Addendum)
[facility] at [place] NAME: [name]    MR#:  [id number]  DATE OF BIRTH:  [birth year]  SUBJECTIVE:  Complains of chest congestion only, would like a breathing treatment, patient evaluated with nursing staff  REVIEW OF SYSTEMS:  CONSTITUTIONAL: No fever, fatigue or weakness.  EYES: No blurred or double vision.  EARS, NOSE, AND THROAT: No tinnitus or ear pain.  RESPIRATORY: No cough, shortness of breath, wheezing or hemoptysis.  CARDIOVASCULAR: No chest pain, orthopnea, edema.  GASTROINTESTINAL: No nausea, vomiting, diarrhea or abdominal pain.  GENITOURINARY: No dysuria, hematuria.  ENDOCRINE: No polyuria, nocturia,  HEMATOLOGY: No anemia, easy bruising or bleeding SKIN: No rash or lesion. MUSCULOSKELETAL: No joint pain or arthritis.   NEUROLOGIC: No tingling, numbness, weakness.  PSYCHIATRY: No anxiety or depression.   ROS  DRUG ALLERGIES:   Allergies  Allergen Reactions  . Brilinta [Ticagrelor] Shortness Of Breath  . Tuberculin Tests Rash  . Benadryl [Diphenhydramine] Other (See Comments)    " Hyperactivity"  . Doxycycline Swelling    Pt went into pulmonary edema.  . Lopid [Gemfibrozil] Swelling    "I gain 1 pound a day for 30 days."    VITALS:  Blood pressure 119/66, pulse 78, temperature 98.2 F (36.8 C), temperature source Oral, resp. rate 19, height 5\' 7"  (1.702 m), weight 100.2 kg, SpO2 93 %.  PHYSICAL EXAMINATION:  GENERAL:  73 y.o.-year-old patient lying in the bed with no acute distress.  EYES: Pupils equal, round, reactive to light and accommodation. No scleral icterus. Extraocular muscles intact.  HEENT: Head atraumatic, normocephalic. Oropharynx and nasopharynx clear.  NECK:  Supple, no jugular venous distention. No thyroid enlargement, no tenderness.  LUNGS: Normal breath sounds bilaterally, no wheezing, rales,rhonchi or crepitation. No use of accessory muscles of respiration.  CARDIOVASCULAR: S1, S2 normal.  No murmurs, rubs, or gallops.  ABDOMEN: Soft, nontender, nondistended. Bowel sounds present. No organomegaly or mass.  EXTREMITIES: No pedal edema, cyanosis, or clubbing.  NEUROLOGIC: Cranial nerves II through XII are intact. Muscle strength 5/5 in all extremities. Sensation intact. Gait not checked.  PSYCHIATRIC: The patient is alert and oriented x 3.  SKIN: No obvious rash, lesion, or ulcer.   Physical Exam LABORATORY PANEL:   CBC Recent Labs  Lab 05/26/19 0708  WBC 8.1  HGB 9.2*  HCT 29.9*  PLT 227   ------------------------------------------------------------------------------------------------------------------  Chemistries  Recent Labs  Lab 05/25/19 2020 05/26/19 0125 05/26/19 0708  NA 137  --  138  K 4.0  --  4.3  CL 98  --  101  CO2 26  --  26  GLUCOSE 119*  --  112*  BUN 30*  --  32*  CREATININE 1.86*  --  1.92*  CALCIUM 8.7*  --  8.3*  MG  --  2.7*  --   AST 22  --   --   ALT 13  --   --   ALKPHOS 90  --   --   BILITOT 0.8  --   --    ------------------------------------------------------------------------------------------------------------------  Cardiac Enzymes Recent Labs  Lab 05/26/19 0125 05/26/19 0708  TROPONINI 0.04* 0.03*   ------------------------------------------------------------------------------------------------------------------  RADIOLOGY:  Dg Chest Port 1 View  Result Date: 05/25/2019 CLINICAL DATA:  Shortness of breath. EXAM: PORTABLE CHEST 1 VIEW COMPARISON:  01/07/2019 FINDINGS: Post median sternotomy and CABG. Left-sided pacemaker is unchanged in position. Right middle and lower lobe confluent airspace opacity which silhouettes the hemidiaphragm. Possible associated pleural effusion. Patchy left  infrahilar opacity and possible left pleural effusion. Mild cardiomegaly which appears similar to prior exam. Peribronchial thickening which may be pulmonary edema or bronchial. No pneumothorax. IMPRESSION: 1. Right middle and lower lobe  confluent airspace opacity concerning for pneumonia. Patchy left infrahilar opacity may be atelectasis or pneumonia. Suspect small pleural effusions. 2. Mild cardiomegaly. Peribronchial thickening may be pulmonary edema or chronic bronchitis changes. Electronically Signed   By: Keith Rake M.D.   On: 05/25/2019 20:50    ASSESSMENT AND PLAN:  *Community-acquired pneumonia Stable Continue pneumonia protocol, empiric IV Rocephin/Zithromax,Mucolytic therapy, follow-up on cultures  *Acute on chronic systolic congestive heart failure exacerbation  Most recent echocardiogram noted for EF of 20% with mild left and right atrial dilatation and moderate tricuspid regurgitation Congestive heart failure protocol, IV Lasix, strict I&O monitoring, daily weights, unable to tolerate antihypertensives given hypotension  *History of hypertension Resolved Noted relative hypotension, continue to avoid antihypertensives   *Coronary artery disease Statin therapy, DAPT with aspirin/Plavix, Coreg if blood pressure will tolerate  *Gout allopurinol  *COPD Inhaled corticosteroids, breathing treatments PRN  *BPH Flomax   *Type 2 diabetes mellitus Stable on current regiment  DVT prophylaxis-Lovenox subcu Disposition Home in 1 to 2 days barring any complications  All the records are reviewed and case discussed with Care Management/Social Workerr. Management plans discussed with the patient, family and they are in agreement.  CODE STATUS: full  TOTAL TIME TAKING CARE OF THIS PATIENT: 40 minutes.     POSSIBLE D/C IN 1-2 DAYS, DEPENDING ON CLINICAL CONDITION.   Avel Peace  M.D on 05/26/2019   Between 7am to 6pm - Pager - [phone]  After 6pm go to [email or web address] - password [email or web address] [facility]  Office  [phone]  CC: Primary care physician; [name], MD  Note: This dictation was prepared with Dragon dictation along with smaller phrase technology.  Any transcriptional errors that result from this process are unintentional.

## 2019-05-26 NOTE — Progress Notes (Signed)
Called to see patient due to increased sudden sob. cpap was no relief. svn was no relief. Patient was on svn when I arrived. sats 97 hr 97-110 bbs diminished with some rales. RN states getting lasix bid. Attempted to place back on cpap but patient could not tolerate. States he feels as if he is going to pass out. I placed patient on bipap since already on cpap on floor. 15/8 30% rate of 8. Patient instantly began to breath better. RN to inform md.

## 2019-05-26 NOTE — Progress Notes (Signed)
Placement of Nephrostomy tubes charted on wrong patient.

## 2019-05-27 ENCOUNTER — Inpatient Hospital Stay: Payer: PPO

## 2019-05-27 DIAGNOSIS — J181 Lobar pneumonia, unspecified organism: Secondary | ICD-10-CM

## 2019-05-27 DIAGNOSIS — J9801 Acute bronchospasm: Secondary | ICD-10-CM

## 2019-05-27 DIAGNOSIS — I420 Dilated cardiomyopathy: Secondary | ICD-10-CM

## 2019-05-27 DIAGNOSIS — J9601 Acute respiratory failure with hypoxia: Secondary | ICD-10-CM

## 2019-05-27 LAB — COMPREHENSIVE METABOLIC PANEL
ALT: 13 U/L (ref 0–44)
AST: 18 U/L (ref 15–41)
Albumin: 3.4 g/dL — ABNORMAL LOW (ref 3.5–5.0)
Alkaline Phosphatase: 82 U/L (ref 38–126)
Anion gap: 12 (ref 5–15)
BUN: 30 mg/dL — ABNORMAL HIGH (ref 8–23)
CO2: 26 mmol/L (ref 22–32)
Calcium: 8.7 mg/dL — ABNORMAL LOW (ref 8.9–10.3)
Chloride: 100 mmol/L (ref 98–111)
Creatinine, Ser: 1.52 mg/dL — ABNORMAL HIGH (ref 0.61–1.24)
GFR calc Af Amer: 52 mL/min — ABNORMAL LOW (ref >60)
GFR calc non Af Amer: 44 mL/min — ABNORMAL LOW (ref >60)
Glucose, Bld: 109 mg/dL — ABNORMAL HIGH (ref 70–99)
Potassium: 3.9 mmol/L (ref 3.5–5.1)
Sodium: 138 mmol/L (ref 135–145)
Total Bilirubin: 0.9 mg/dL (ref 0.3–1.2)
Total Protein: 7.3 g/dL (ref 6.5–8.1)

## 2019-05-27 LAB — BLOOD GAS, ARTERIAL
Acid-Base Excess: 2.5 mmol/L — ABNORMAL HIGH (ref 0.0–2.0)
Bicarbonate: 26.3 mmol/L (ref 20.0–28.0)
Delivery systems: POSITIVE
Expiratory PAP: 8
FIO2: 30
Inspiratory PAP: 15
O2 Saturation: 97.6 %
Patient temperature: 37
pCO2 arterial: 37 mmHg (ref 32.0–48.0)
pH, Arterial: 7.46 — ABNORMAL HIGH (ref 7.350–7.450)
pO2, Arterial: 92 mmHg (ref 83.0–108.0)

## 2019-05-27 LAB — GLUCOSE, CAPILLARY
Glucose-Capillary: 110 mg/dL — ABNORMAL HIGH (ref 70–99)
Glucose-Capillary: 109 mg/dL — ABNORMAL HIGH (ref 70–99)
Glucose-Capillary: 103 mg/dL — ABNORMAL HIGH (ref 70–99)
Glucose-Capillary: 109 mg/dL — ABNORMAL HIGH (ref 70–99)
Glucose-Capillary: 158 mg/dL — ABNORMAL HIGH (ref 70–99)

## 2019-05-27 LAB — CBC
HCT: 31 % — ABNORMAL LOW (ref 39.0–52.0)
Hemoglobin: 9.6 g/dL — ABNORMAL LOW (ref 13.0–17.0)
MCH: 27.4 pg (ref 26.0–34.0)
MCHC: 31 g/dL (ref 30.0–36.0)
MCV: 88.6 fL (ref 80.0–100.0)
Platelets: 238 10*3/uL (ref 150–400)
RBC: 3.5 MIL/uL — ABNORMAL LOW (ref 4.22–5.81)
RDW: 17.8 % — ABNORMAL HIGH (ref 11.5–15.5)
WBC: 10.6 10*3/uL — ABNORMAL HIGH (ref 4.0–10.5)
nRBC: 0 % (ref 0.0–0.2)

## 2019-05-27 LAB — BRAIN NATRIURETIC PEPTIDE: B Natriuretic Peptide: 2857 pg/mL — ABNORMAL HIGH (ref 0.0–100.0)

## 2019-05-27 LAB — HIV ANTIBODY (ROUTINE TESTING W REFLEX): HIV Screen 4th Generation wRfx: NONREACTIVE

## 2019-05-27 LAB — MRSA PCR SCREENING: MRSA by PCR: NEGATIVE

## 2019-05-27 MED ORDER — FUROSEMIDE 10 MG/ML IJ SOLN
20.0000 mg | Freq: Once | INTRAMUSCULAR | Status: AC
  Administered 2019-05-27: 20 mg via INTRAVENOUS
  Filled 2019-05-27: qty 2

## 2019-05-27 MED ORDER — METHYLPREDNISOLONE SODIUM SUCC 40 MG IJ SOLR
40.0000 mg | Freq: Two times a day (BID) | INTRAMUSCULAR | Status: DC
  Administered 2019-05-27: 40 mg via INTRAVENOUS
  Filled 2019-05-27: qty 1

## 2019-05-27 MED ORDER — IPRATROPIUM-ALBUTEROL 0.5-2.5 (3) MG/3ML IN SOLN
3.0000 mL | Freq: Four times a day (QID) | RESPIRATORY_TRACT | Status: DC
  Administered 2019-05-27 – 2019-05-31 (×16): 3 mL via RESPIRATORY_TRACT
  Filled 2019-05-27 (×16): qty 3

## 2019-05-27 MED ORDER — ENOXAPARIN SODIUM 40 MG/0.4ML ~~LOC~~ SOLN
40.0000 mg | SUBCUTANEOUS | Status: DC
  Administered 2019-05-28 – 2019-05-30 (×3): 40 mg via SUBCUTANEOUS
  Filled 2019-05-27 (×4): qty 0.4

## 2019-05-27 MED ORDER — TRAZODONE HCL 50 MG PO TABS: 50.0000 mg | ORAL_TABLET | Freq: Every evening | ORAL | Status: DC | PRN

## 2019-05-27 MED ORDER — METHYLPREDNISOLONE SODIUM SUCC 40 MG IJ SOLR
40.0000 mg | Freq: Two times a day (BID) | INTRAMUSCULAR | Status: DC
  Administered 2019-05-27 – 2019-05-28 (×2): 40 mg via INTRAVENOUS
  Filled 2019-05-27 (×2): qty 1

## 2019-05-27 MED ORDER — ALPRAZOLAM 0.25 MG PO TABS: 0.2500 mg | ORAL_TABLET | Freq: Four times a day (QID) | ORAL | Status: DC | PRN

## 2019-05-27 MED ORDER — INSULIN ASPART 100 UNIT/ML ~~LOC~~ SOLN
0.0000 [IU] | Freq: Three times a day (TID) | SUBCUTANEOUS | Status: DC
  Administered 2019-05-28: 5 [IU] via SUBCUTANEOUS
  Administered 2019-05-28: 3 [IU] via SUBCUTANEOUS
  Administered 2019-05-28 – 2019-05-29 (×2): 2 [IU] via SUBCUTANEOUS
  Administered 2019-05-29: 3 [IU] via SUBCUTANEOUS
  Administered 2019-05-30 (×3): 2 [IU] via SUBCUTANEOUS
  Filled 2019-05-27 (×8): qty 1

## 2019-05-27 MED ORDER — BUDESONIDE 0.25 MG/2ML IN SUSP
0.2500 mg | Freq: Four times a day (QID) | RESPIRATORY_TRACT | Status: DC
  Administered 2019-05-27 – 2019-05-31 (×16): 0.25 mg via RESPIRATORY_TRACT
  Filled 2019-05-27 (×16): qty 2

## 2019-05-27 MED ORDER — ALBUTEROL SULFATE (2.5 MG/3ML) 0.083% IN NEBU: 2.5000 mg | INHALATION_SOLUTION | RESPIRATORY_TRACT | Status: DC | PRN

## 2019-05-27 MED ORDER — INSULIN ASPART 100 UNIT/ML ~~LOC~~ SOLN
0.0000 [IU] | Freq: Every day | SUBCUTANEOUS | Status: DC
  Filled 2019-05-27: qty 1

## 2019-05-27 NOTE — Consult Note (Signed)
PULMONARY CONSULT NOTE  Requesting MD/Service: [facility] hospitalist Date of initial consultation: 05/27/19 Reason for consultation: Acute on chronic respiratory failure  PT PROFILE: 73 y.o. male former (remote) smoker with history of COPD, OSA, ischemic cardiomyopathy (LVEF 20% by echo 09/03/18), congestive heart failure adm 6/20 via ED with several days of increased SOB and productive cough but no fever.  Admission diagnoses of community-acquired pneumonia and CHF exacerbation.  Patient was admitted to [facility] floor but transferred to ICU/SDU 6/21 AM due to increased work of breathing and need for noninvasive ventilation  DATA:   INTERVAL:  HPI:  As above.  At the time of my evaluation he was off of the BiPAP but complaining of breathlessness and requesting that it be resumed.  He denies purulent sputum, hemoptysis, chest pain, fevers.  Past Medical History:  Diagnosis Date  . Anemia   . Cancer ([place]) 12/2013   prostate  . Cardiogenic pulmonary edema ([place]) 12/19/2014  . Cardiomyopathy, ischemic   . CHF (congestive heart failure) ([place])   . COPD (chronic obstructive pulmonary disease) ([place])   . Depression   . Diabetes mellitus without complication ([place])   . GERD (gastroesophageal reflux disease)   . Gout   . Hypercholesteremia   . Hypertension   . Myocardial infarction ([place]) [id number]  . OSA on CPAP   . Shortness of breath dyspnea     Past Surgical History:  Procedure Laterality Date  . CARDIAC CATHETERIZATION N/A 02/02/2016   Procedure: Left Heart Cath and Coronary Angiography;  Surgeon: [name], MD;  Location: [place] [facility];  Service: Cardiovascular;  Laterality: N/A;  . CORONARY ANGIOPLASTY WITH STENT PLACEMENT    . CORONARY ARTERY BYPASS GRAFT  11/24/2010  . CORONARY STENT INTERVENTION N/A 09/04/2018   Procedure: CORONARY STENT INTERVENTION;  Surgeon: [name], MD;  Location: [place] [facility];  Service: Cardiovascular;  Laterality: N/A;  .  IMPLANTABLE CARDIOVERTER DEFIBRILLATOR (ICD) GENERATOR CHANGE Left 12/12/2015   Procedure: DUAL LEAD PLACEMENT CARDIAC DIFIBRILLATOR;  Surgeon: [name], MD;  Location: [facility];  Service: Cardiovascular;  Laterality: Left;  . LEFT HEART CATH AND CORS/GRAFTS ANGIOGRAPHY N/A 09/04/2018   Procedure: LEFT HEART CATH AND CORS/GRAFTS ANGIOGRAPHY;  Surgeon: [name], MD;  Location: [place] [facility];  Service: Cardiovascular;  Laterality: N/A;  . TONSILLECTOMY      MEDICATIONS: I have reviewed all medications and confirmed regimen as documented  Social History   Socioeconomic History  . Marital status: Married    Spouse name: Not on file  . Number of children: Not on file  . Years of education: 22  . Highest education level: High school graduate  Occupational History  . Occupation: retired  Scientific laboratory technician  . Financial resource strain: Not hard at all  . Food insecurity    Worry: Never true    Inability: Never true  . Transportation needs    Medical: No    Non-medical: No  Tobacco Use  . Smoking status: Former Smoker    Packs/day: 2.00    Years: 30.00    Pack years: 60.00    Quit date: 12/03/1996    Years since quitting: 22.4  . Smokeless tobacco: Never Used  Substance and Sexual Activity  . Alcohol use: No    Alcohol/week: 0.0 standard drinks  . Drug use: No  . Sexual activity: Not Currently  Lifestyle  . Physical activity    Days per week: 0 days    Minutes per session: 0  min  . Stress: Not at all  Relationships  . Social connections    Talks on phone: More than three times a week    Gets together: More than three times a week    Attends religious service: More than 4 times per year    Active member of club or organization: No    Attends meetings of clubs or organizations: Never    Relationship status: Married  . Intimate partner violence    Fear of current or ex partner: Patient refused    Emotionally abused: Patient refused    Physically abused:  Patient refused    Forced sexual activity: Patient refused  Other Topics Concern  . Not on file  Social History Narrative  . Not on file    Family History  Problem Relation Age of Onset  . CAD Mother   . Cancer Mother   . Diabetes Mother   . Alzheimer's disease Father   . Cancer Father   . Heart disease Father     ROS: No fever, myalgias/arthralgias, unexplained weight loss or weight gain No new focal weakness or sensory deficits No otalgia, hearing loss, visual changes, nasal and sinus symptoms, mouth and throat problems No neck pain or adenopathy No abdominal pain, N/V/D, diarrhea, change in bowel pattern No dysuria, change in urinary pattern   Vitals:   05/27/19 1100 05/27/19 1200 05/27/19 1300 05/27/19 1428  BP:  128/65 (!) 128/59   Pulse: 77 72 73   Resp: (!) 29 (!) 22 (!) 22 (!) 25  Temp:  98.2 F (36.8 C)    TempSrc:  Axillary    SpO2: 100% 99% 94% 97%  Weight:      Height:         EXAM:  Gen: Anxious and mildly breathless with speech HEENT: NCAT, sclerae white Neck: No JVD Lungs: Distant breath sounds with wheezes noted posteriorly and faint bibasilar crackles Cardiovascular: Distant heart sounds, regular, no M noted Abdomen: Soft, nontender, normal BS Ext: Symmetric BLE edema Neuro: grossly intact Skin: Limited exam, no lesions noted   DATA:   BMP Latest Ref Rng & Units 05/27/2019 05/26/2019 05/25/2019  Glucose 70 - 99 mg/dL 109(H) 112(H) 119(H)  BUN 8 - 23 mg/dL 30(H) 32(H) 30(H)  Creatinine 0.61 - 1.24 mg/dL 1.52(H) 1.92(H) 1.86(H)  Sodium 135 - 145 mmol/L 138 138 137  Potassium 3.5 - 5.1 mmol/L 3.9 4.3 4.0  Chloride 98 - 111 mmol/L 100 101 98  CO2 22 - 32 mmol/L 26 26 26   Calcium 8.9 - 10.3 mg/dL 8.7(L) 8.3(L) 8.7(L)    CBC Latest Ref Rng & Units 05/27/2019 05/26/2019 05/26/2019  WBC 4.0 - 10.5 K/uL 10.6(H) 8.1 8.9  Hemoglobin 13.0 - 17.0 g/dL 9.6(L) 9.2(L) 9.2(L)  Hematocrit 39.0 - 52.0 % 31.0(L) 29.9(L) 29.6(L)  Platelets 150 - 400 K/uL  238 227 230    CXR: Cardiomegaly, right pleural effusion, retrocardiac LLL atelectasis versus infiltrate  I have personally reviewed all chest radiographs reported above including CXRs and CT chest unless otherwise indicated  IMPRESSION:   Acute/chronic hypoxemic respiratory failure-multifactorial History of COPD with acute bronchospasm History of CHF with edema pattern on CXR Elevated PCT, possible CAP   PLAN:  PRN BiPAP Supplemental O2 to maintain SPO2 >90% Continue systemic steroids Initiate nebulized steroids and bronchodilators Continue antibiotics for presumed CAP Continue diuresis and monitor renal function panel   Merton Border, MD PCCM service Mobile [phone] Pager [phone] 05/27/2019 3:31 PM

## 2019-05-27 NOTE — Progress Notes (Signed)
Patient to be transferred to ICU due to continuous BIPAP.  Report called and given to ICU. Patient transferred via bed.

## 2019-05-27 NOTE — Progress Notes (Addendum)
[place] [facility] [place] NAME: [name]    MR#:  [id number]  DATE OF BIRTH:  [birth year]  SUBJECTIVE:  Per nursing staff-patient continued to complain of shortness of breath which felt better with BiPAP, nursing staff requesting transfer to stepdown unit, case discussed with intensivist, patient appears comfortable on BiPAP, blood gas normal, chest x-ray noted for improvement, lab work unimpressive  REVIEW OF SYSTEMS:  CONSTITUTIONAL: No fever, fatigue or weakness.  EYES: No blurred or double vision.  EARS, NOSE, AND THROAT: No tinnitus or ear pain.  RESPIRATORY: No cough, shortness of breath, wheezing or hemoptysis.  CARDIOVASCULAR: No chest pain, orthopnea, edema.  GASTROINTESTINAL: No nausea, vomiting, diarrhea or abdominal pain.  GENITOURINARY: No dysuria, hematuria.  ENDOCRINE: No polyuria, nocturia,  HEMATOLOGY: No anemia, easy bruising or bleeding SKIN: No rash or lesion. MUSCULOSKELETAL: No joint pain or arthritis.   NEUROLOGIC: No tingling, numbness, weakness.  PSYCHIATRY: No anxiety or depression.   ROS  DRUG ALLERGIES:   Allergies  Allergen Reactions  . Brilinta [Ticagrelor] Shortness Of Breath  . Tuberculin Tests Rash  . Benadryl [Diphenhydramine] Other (See Comments)    " Hyperactivity"  . Doxycycline Swelling    Pt went into pulmonary edema.  . Lopid [Gemfibrozil] Swelling    "I gain 1 pound a day for 30 days."    VITALS:  Blood pressure 128/65, pulse 72, temperature 98.2 F (36.8 C), temperature source Axillary, resp. rate (!) 22, height 5\' 7"  (1.702 m), weight 99.2 kg, SpO2 99 %.  PHYSICAL EXAMINATION:  GENERAL:  73 y.o.-year-old patient lying in the bed with no acute distress.  EYES: Pupils equal, round, reactive to light and accommodation. No scleral icterus. Extraocular muscles intact.  HEENT: Head atraumatic, normocephalic. Oropharynx and nasopharynx clear.  NECK:  Supple, no jugular venous distention. No  thyroid enlargement, no tenderness.  LUNGS: Normal breath sounds bilaterally, no wheezing, rales,rhonchi or crepitation. No use of accessory muscles of respiration.  CARDIOVASCULAR: S1, S2 normal. No murmurs, rubs, or gallops.  ABDOMEN: Soft, nontender, nondistended. Bowel sounds present. No organomegaly or mass.  EXTREMITIES: No pedal edema, cyanosis, or clubbing.  NEUROLOGIC: Cranial nerves II through XII are intact. Muscle strength 5/5 in all extremities. Sensation intact. Gait not checked.  PSYCHIATRIC: The patient is alert and oriented x 3.  SKIN: No obvious rash, lesion, or ulcer.   Physical Exam LABORATORY PANEL:   CBC Recent Labs  Lab 05/27/19 1036  WBC 10.6*  HGB 9.6*  HCT 31.0*  PLT 238   ------------------------------------------------------------------------------------------------------------------  Chemistries  Recent Labs  Lab 05/26/19 0125  05/27/19 1036  NA  --    < > 138  K  --    < > 3.9  CL  --    < > 100  CO2  --    < > 26  GLUCOSE  --    < > 109*  BUN  --    < > 30*  CREATININE  --    < > 1.52*  CALCIUM  --    < > 8.7*  MG 2.7*  --   --   AST  --   --  18  ALT  --   --  13  ALKPHOS  --   --  82  BILITOT  --   --  0.9   < > = values in this interval not displayed.   ------------------------------------------------------------------------------------------------------------------  Cardiac Enzymes Recent Labs  Lab 05/26/19 0125 05/26/19 5277  TROPONINI 0.04* 0.03*   ------------------------------------------------------------------------------------------------------------------  RADIOLOGY:  Dg Chest Port 1 View  Result Date: 05/27/2019 CLINICAL DATA:  Shortness of breath. EXAM: PORTABLE CHEST 1 VIEW COMPARISON:  05/25/2019 FINDINGS: Sternotomy wires and left-sided pacemaker unchanged. Persistent right middle lobe/right base opacification with slightly improved aeration likely effusion with associated atelectasis as infection is possible in  the right base. Mild stable prominence of the central pulmonary vessels. Cardiomediastinal silhouette and remainder of the exam is unchanged. IMPRESSION: Interval improved aeration right base with persistent opacification likely right-sided effusion with associated atelectasis although infection is possible. Mild vascular congestion. Electronically Signed   By: Marin Olp M.D.   On: 05/27/2019 09:24   Dg Chest Port 1 View  Result Date: 05/25/2019 CLINICAL DATA:  Shortness of breath. EXAM: PORTABLE CHEST 1 VIEW COMPARISON:  01/07/2019 FINDINGS: Post median sternotomy and CABG. Left-sided pacemaker is unchanged in position. Right middle and lower lobe confluent airspace opacity which silhouettes the hemidiaphragm. Possible associated pleural effusion. Patchy left infrahilar opacity and possible left pleural effusion. Mild cardiomegaly which appears similar to prior exam. Peribronchial thickening which may be pulmonary edema or bronchial. No pneumothorax. IMPRESSION: 1. Right middle and lower lobe confluent airspace opacity concerning for pneumonia. Patchy left infrahilar opacity may be atelectasis or pneumonia. Suspect small pleural effusions. 2. Mild cardiomegaly. Peribronchial thickening may be pulmonary edema or chronic bronchitis changes. Electronically Signed   By: Keith Rake M.D.   On: 05/25/2019 20:50    ASSESSMENT AND PLAN:  *Acute on chronic hypoxic respiratory failure Patient transferred to stepdown unit on May 27, 2019 given complaints of worsening shortness of breath, inability to be weaned off BiPAP Patient transferred to stepdown unit per nursing request, discussed with intensivist/Dr. Alva Garnet, ABG normal, chest x-ray noted for improvement, lab work noted for improvement  Note patient is on 5 L at night and oxygen as needed during the day  *Community-acquired pneumonia Resolving Continue pneumonia protocol, empiric IV Rocephin/Zithromax,Mucolytic therapy, cultures negative thus  far   *Acute on chronic systolic congestive heart failure exacerbation  Resolving Most recent echocardiogram noted for EF of 20% with mild left and right atrial dilatation and moderate tricuspid regurgitation Continue CHF protocol, IV Lasix, I&Os, daily weights, Coreg/lisinopril if blood pressure will tolerate   *History of hypertension Resolved Normotensive on Lasix without any other antihypertensive medication    *Coronary artery disease Statin therapy, DAPT with aspirin/Plavix, Coreg/lisinopril if blood pressure will tolerate  *Gout allopurinol  *COPD Inhaled corticosteroids, breathing treatments PRN  *BPH Flomax   *Type 2 diabetes mellitus Stable on current regiment  DVT prophylaxis-Lovenox subcu Disposition pending clinical course   all the records are reviewed and case discussed with Care Management/Social Workerr. Management plans discussed with the patient, family and they are in agreement.  CODE STATUS: full  TOTAL TIME TAKING CARE OF THIS PATIENT: 40 minutes.     POSSIBLE D/C IN 1-2 DAYS, DEPENDING ON CLINICAL CONDITION.   Avel Peace Salary M.D on 05/27/2019   Between 7am to 6pm - Pager - [phone]  After 6pm go to [email or web address] - password [email or web address] [facility]  Office  [phone]  CC: Primary care physician; [name], MD  Note: This dictation was prepared with Dragon dictation along with smaller phrase technology. Any transcriptional errors that result from this process are unintentional.

## 2019-05-28 ENCOUNTER — Inpatient Hospital Stay: Payer: PPO

## 2019-05-28 DIAGNOSIS — J441 Chronic obstructive pulmonary disease with (acute) exacerbation: Secondary | ICD-10-CM

## 2019-05-28 DIAGNOSIS — J810 Acute pulmonary edema: Secondary | ICD-10-CM

## 2019-05-28 LAB — COMPREHENSIVE METABOLIC PANEL
ALT: 14 U/L (ref 0–44)
AST: 20 U/L (ref 15–41)
Albumin: 3 g/dL — ABNORMAL LOW (ref 3.5–5.0)
Alkaline Phosphatase: 75 U/L (ref 38–126)
Anion gap: 13 (ref 5–15)
BUN: 31 mg/dL — ABNORMAL HIGH (ref 8–23)
CO2: 25 mmol/L (ref 22–32)
Calcium: 8.9 mg/dL (ref 8.9–10.3)
Chloride: 105 mmol/L (ref 98–111)
Creatinine, Ser: 1.31 mg/dL — ABNORMAL HIGH (ref 0.61–1.24)
GFR calc Af Amer: >60 mL/min (ref >60)
GFR calc non Af Amer: 53 mL/min — ABNORMAL LOW (ref >60)
Glucose, Bld: 154 mg/dL — ABNORMAL HIGH (ref 70–99)
Potassium: 4.1 mmol/L (ref 3.5–5.1)
Sodium: 143 mmol/L (ref 135–145)
Total Bilirubin: 0.9 mg/dL (ref 0.3–1.2)
Total Protein: 6.7 g/dL (ref 6.5–8.1)

## 2019-05-28 LAB — CBC
HCT: 29.6 % — ABNORMAL LOW (ref 39.0–52.0)
Hemoglobin: 9.3 g/dL — ABNORMAL LOW (ref 13.0–17.0)
MCH: 28.1 pg (ref 26.0–34.0)
MCHC: 31.4 g/dL (ref 30.0–36.0)
MCV: 89.4 fL (ref 80.0–100.0)
Platelets: 219 10*3/uL (ref 150–400)
RBC: 3.31 MIL/uL — ABNORMAL LOW (ref 4.22–5.81)
RDW: 17.7 % — ABNORMAL HIGH (ref 11.5–15.5)
WBC: 5.8 10*3/uL (ref 4.0–10.5)
nRBC: 0 % (ref 0.0–0.2)

## 2019-05-28 LAB — HEMOGLOBIN A1C
Hgb A1c MFr Bld: 5.5 % (ref 4.8–5.6)
Mean Plasma Glucose: 111 mg/dL

## 2019-05-28 LAB — GLUCOSE, CAPILLARY
Glucose-Capillary: 134 mg/dL — ABNORMAL HIGH (ref 70–99)
Glucose-Capillary: 203 mg/dL — ABNORMAL HIGH (ref 70–99)
Glucose-Capillary: 185 mg/dL — ABNORMAL HIGH (ref 70–99)
Glucose-Capillary: 198 mg/dL — ABNORMAL HIGH (ref 70–99)

## 2019-05-28 LAB — PROCALCITONIN: Procalcitonin: 0.14 ng/mL

## 2019-05-28 MED ORDER — POTASSIUM CHLORIDE CRYS ER 20 MEQ PO TBCR
40.0000 meq | EXTENDED_RELEASE_TABLET | Freq: Once | ORAL | Status: AC
  Administered 2019-05-28: 40 meq via ORAL
  Filled 2019-05-28: qty 2

## 2019-05-28 MED ORDER — FUROSEMIDE 10 MG/ML IJ SOLN
40.0000 mg | Freq: Two times a day (BID) | INTRAMUSCULAR | Status: DC
  Administered 2019-05-28 – 2019-05-30 (×4): 40 mg via INTRAVENOUS
  Filled 2019-05-28 (×3): qty 4

## 2019-05-28 NOTE — Consult Note (Signed)
Reason for Consult: Shortness of breath congestive heart failure Referring Physician: Dr. Sidney Ace hospitalist  [name] is an 73 y.o. male.  HPI: Patient presented with significant shortness of breath and dyspnea symptoms been ongoing for the past few days he got progressively worse history of known multiple medical problems dyspnea shortness of breath dry cough shaking chills over the last few days no nausea vomiting or abdominal pain he has had lower extremity edema paroxysmal dyspnea orthopnea generalized weakness patient was found to have elevated BNP mild anemia chest x-ray suggested possible pneumonia COVID was negative patient was treated with broad-spectrum antibiotic therapy cardiology was consulted for heart failure shortness of breath  Past Medical History:  Diagnosis Date  . Anemia   . Cancer ([place]) 12/2013   prostate  . Cardiogenic pulmonary edema ([place]) 12/19/2014  . Cardiomyopathy, ischemic   . CHF (congestive heart failure) ([place])   . COPD (chronic obstructive pulmonary disease) ([place])   . Depression   . Diabetes mellitus without complication ([place])   . GERD (gastroesophageal reflux disease)   . Gout   . Hypercholesteremia   . Hypertension   . Myocardial infarction ([place]) [id number]  . OSA on CPAP   . Shortness of breath dyspnea     Past Surgical History:  Procedure Laterality Date  . CARDIAC CATHETERIZATION N/A 02/02/2016   Procedure: Left Heart Cath and Coronary Angiography;  Surgeon: [name], MD;  Location: [facility];  Service: Cardiovascular;  Laterality: N/A;  . CORONARY ANGIOPLASTY WITH STENT PLACEMENT    . CORONARY ARTERY BYPASS GRAFT  11/24/2010  . CORONARY STENT INTERVENTION N/A 09/04/2018   Procedure: CORONARY STENT INTERVENTION;  Surgeon: [name], MD;  Location: [facility];  Service: Cardiovascular;  Laterality: N/A;  . IMPLANTABLE CARDIOVERTER DEFIBRILLATOR (ICD) GENERATOR CHANGE Left 12/12/2015   Procedure: DUAL  LEAD PLACEMENT CARDIAC DIFIBRILLATOR;  Surgeon: [name], MD;  Location: [facility];  Service: Cardiovascular;  Laterality: Left;  . LEFT HEART CATH AND CORS/GRAFTS ANGIOGRAPHY N/A 09/04/2018   Procedure: LEFT HEART CATH AND CORS/GRAFTS ANGIOGRAPHY;  Surgeon: [name], MD;  Location: [facility];  Service: Cardiovascular;  Laterality: N/A;  . TONSILLECTOMY      Family History  Problem Relation Age of Onset  . CAD Mother   . Cancer Mother   . Diabetes Mother   . Alzheimer's disease Father   . Cancer Father   . Heart disease Father     Social History:  reports that he quit smoking about 22 years ago. He has a 60.00 pack-year smoking history. He has never used smokeless tobacco. He reports that he does not drink alcohol or use drugs.  Allergies:  Allergies  Allergen Reactions  . Brilinta [Ticagrelor] Shortness Of Breath  . Tuberculin Tests Rash  . Benadryl [Diphenhydramine] Other (See Comments)    " Hyperactivity"  . Doxycycline Swelling    Pt went into pulmonary edema.  . Lopid [Gemfibrozil] Swelling    "I gain 1 pound a day for 30 days."    Medications: I have reviewed the patient's current medications.  Results for orders placed or performed during the hospital encounter of 05/25/19 (from the past 48 hour(s))  Blood gas, arterial     Status: Abnormal   Collection Time: 05/27/19  8:33 AM  Result Value Ref Range   FIO2 30.00    Delivery systems BILEVEL POSITIVE AIRWAY PRESSURE    Inspiratory PAP 15    Expiratory PAP 8  pH, Arterial 7.46 (H) 7.350 - 7.450   pCO2 arterial 37 32.0 - 48.0 mmHg   pO2, Arterial 92 83.0 - 108.0 mmHg   Bicarbonate 26.3 20.0 - 28.0 mmol/L   Acid-Base Excess 2.5 (H) 0.0 - 2.0 mmol/L   O2 Saturation 97.6 %   Patient temperature 37.0    Collection site RIGHT RADIAL    Sample type ARTERIAL DRAW    Allens test (pass/fail) PASS PASS    Comment: Performed at [facility], [address] [place], [place] [address]   Glucose, capillary     Status: Abnormal   Collection Time: 05/27/19  9:13 AM  Result Value Ref Range   Glucose-Capillary 110 (H) 70 - 99 mg/dL  Glucose, capillary     Status: Abnormal   Collection Time: 05/27/19 10:31 AM  Result Value Ref Range   Glucose-Capillary 109 (H) 70 - 99 mg/dL  CBC     Status: Abnormal   Collection Time: 05/27/19 10:36 AM  Result Value Ref Range   WBC 10.6 (H) 4.0 - 10.5 K/uL   RBC 3.50 (L) 4.22 - 5.81 MIL/uL   Hemoglobin 9.6 (L) 13.0 - 17.0 g/dL   HCT 31.0 (L) 39.0 - 52.0 %   MCV 88.6 80.0 - 100.0 fL   MCH 27.4 26.0 - 34.0 pg   MCHC 31.0 30.0 - 36.0 g/dL   RDW 17.8 (H) 11.5 - 15.5 %   Platelets 238 150 - 400 K/uL   nRBC 0.0 0.0 - 0.2 %    Comment: Performed at [facility], [address] [place], [place] [address]  Comprehensive metabolic panel     Status: Abnormal   Collection Time: 05/27/19 10:36 AM  Result Value Ref Range   Sodium 138 135 - 145 mmol/L   Potassium 3.9 3.5 - 5.1 mmol/L   Chloride 100 98 - 111 mmol/L   CO2 26 22 - 32 mmol/L   Glucose, Bld 109 (H) 70 - 99 mg/dL   BUN 30 (H) 8 - 23 mg/dL   Creatinine, Ser 1.52 (H) 0.61 - 1.24 mg/dL   Calcium 8.7 (L) 8.9 - 10.3 mg/dL   Total Protein 7.3 6.5 - 8.1 g/dL   Albumin 3.4 (L) 3.5 - 5.0 g/dL   AST 18 15 - 41 U/L   ALT 13 0 - 44 U/L   Alkaline Phosphatase 82 38 - 126 U/L   Total Bilirubin 0.9 0.3 - 1.2 mg/dL   GFR calc non Af Amer 44 (L) >60 mL/min   GFR calc Af Amer 52 (L) >60 mL/min   Anion gap 12 5 - 15    Comment: Performed at [facility], [address] [place], [place] [address]  MRSA PCR Screening     Status: None   Collection Time: 05/27/19 10:37 AM   Specimen: Nasopharyngeal  Result Value Ref Range   MRSA by PCR NEGATIVE NEGATIVE    Comment:        The GeneXpert MRSA Assay (FDA approved for NASAL specimens only), is one component of a comprehensive MRSA colonization surveillance program. It is not intended to diagnose MRSA infection nor to guide  or monitor treatment for MRSA infections. Performed at [facility], [address] [place], [place] [address]   Glucose, capillary     Status: Abnormal   Collection Time: 05/27/19 11:48 AM  Result Value Ref Range   Glucose-Capillary 103 (H) 70 - 99 mg/dL  Brain natriuretic peptide     Status: Abnormal  Collection Time: 05/27/19  3:33 PM  Result Value Ref Range   B Natriuretic Peptide 2,857.0 (H) 0.0 - 100.0 pg/mL    Comment: Performed at [facility], [address] [place], [place] [address]  Glucose, capillary     Status: Abnormal   Collection Time: 05/27/19  4:08 PM  Result Value Ref Range   Glucose-Capillary 109 (H) 70 - 99 mg/dL  Glucose, capillary     Status: Abnormal   Collection Time: 05/27/19  9:49 PM  Result Value Ref Range   Glucose-Capillary 158 (H) 70 - 99 mg/dL  CBC     Status: Abnormal   Collection Time: 05/28/19  5:53 AM  Result Value Ref Range   WBC 5.8 4.0 - 10.5 K/uL   RBC 3.31 (L) 4.22 - 5.81 MIL/uL   Hemoglobin 9.3 (L) 13.0 - 17.0 g/dL   HCT 29.6 (L) 39.0 - 52.0 %   MCV 89.4 80.0 - 100.0 fL   MCH 28.1 26.0 - 34.0 pg   MCHC 31.4 30.0 - 36.0 g/dL   RDW 17.7 (H) 11.5 - 15.5 %   Platelets 219 150 - 400 K/uL   nRBC 0.0 0.0 - 0.2 %    Comment: Performed at [facility], [address] [place], [place] [address]  Comprehensive metabolic panel     Status: Abnormal   Collection Time: 05/28/19  5:53 AM  Result Value Ref Range   Sodium 143 135 - 145 mmol/L   Potassium 4.1 3.5 - 5.1 mmol/L   Chloride 105 98 - 111 mmol/L   CO2 25 22 - 32 mmol/L   Glucose, Bld 154 (H) 70 - 99 mg/dL   BUN 31 (H) 8 - 23 mg/dL   Creatinine, Ser 1.31 (H) 0.61 - 1.24 mg/dL   Calcium 8.9 8.9 - 10.3 mg/dL   Total Protein 6.7 6.5 - 8.1 g/dL   Albumin 3.0 (L) 3.5 - 5.0 g/dL   AST 20 15 - 41 U/L   ALT 14 0 - 44 U/L   Alkaline Phosphatase 75 38 - 126 U/L   Total Bilirubin 0.9 0.3 - 1.2 mg/dL   GFR calc non Af Amer 53 (L) >60 mL/min   GFR calc Af  Amer >60 >60 mL/min   Anion gap 13 5 - 15    Comment: Performed at [facility], [address] [place], [place] [address]  Procalcitonin     Status: None   Collection Time: 05/28/19  5:53 AM  Result Value Ref Range   Procalcitonin 0.14 ng/mL    Comment:        Interpretation: PCT (Procalcitonin) <= 0.5 ng/mL: Systemic infection (sepsis) is not likely. Local bacterial infection is possible. (NOTE)       Sepsis PCT Algorithm           Lower Respiratory Tract                                      Infection PCT Algorithm    ----------------------------     ----------------------------         PCT < 0.25 ng/mL                PCT < 0.10 ng/mL         Strongly encourage             Strongly discourage   discontinuation of antibiotics    initiation  of antibiotics    ----------------------------     -----------------------------       PCT 0.25 - 0.50 ng/mL            PCT 0.10 - 0.25 ng/mL               OR       >80% decrease in PCT            Discourage initiation of                                            antibiotics      Encourage discontinuation           of antibiotics    ----------------------------     -----------------------------         PCT >= 0.50 ng/mL              PCT 0.26 - 0.50 ng/mL               AND        <80% decrease in PCT             Encourage initiation of                                             antibiotics       Encourage continuation           of antibiotics    ----------------------------     -----------------------------        PCT >= 0.50 ng/mL                  PCT > 0.50 ng/mL               AND         increase in PCT                  Strongly encourage                                      initiation of antibiotics    Strongly encourage escalation           of antibiotics                                     -----------------------------                                           PCT <= 0.25 ng/mL                                                  OR                                        >  80% decrease in PCT                                     Discontinue / Do not initiate                                             antibiotics Performed at [facility], [address] [place], [place] [address]   Glucose, capillary     Status: Abnormal   Collection Time: 05/28/19  7:37 AM  Result Value Ref Range   Glucose-Capillary 134 (H) 70 - 99 mg/dL  Glucose, capillary     Status: Abnormal   Collection Time: 05/28/19 11:33 AM  Result Value Ref Range   Glucose-Capillary 203 (H) 70 - 99 mg/dL  Glucose, capillary     Status: Abnormal   Collection Time: 05/28/19  4:45 PM  Result Value Ref Range   Glucose-Capillary 185 (H) 70 - 99 mg/dL  Glucose, capillary     Status: Abnormal   Collection Time: 05/28/19  9:19 PM  Result Value Ref Range   Glucose-Capillary 198 (H) 70 - 99 mg/dL   Comment 1 Document in Chart     Dg Chest Port 1 View  Result Date: 05/28/2019 CLINICAL DATA:  Respiratory failure. EXAM: PORTABLE CHEST 1 VIEW COMPARISON:  05/27/2019. FINDINGS: Cardiac pacer stable position. Prior CABG. Cardiomegaly with pulmonary venous congestion. Increase interstitial markings noted bilaterally suggesting interstitial edema. Pneumonitis cannot be excluded. Persistent atelectasis/infiltrate right lung base. Persistent right pleural effusion. No pneumothorax. IMPRESSION: 1. Cardiac pacer stable position. Prior CABG. Cardiomegaly with pulmonary venous congestion. 2. Increase interstitial markings noted bilaterally suggesting interstitial edema. Pneumonitis cannot be excluded. 3. Persistent unchanged atelectasis/infiltrate right lung base. Persistent unchanged right pleural effusion is again noted. Electronically Signed   By: Marcello Moores  Register   On: 05/28/2019 07:17   Dg Chest Port 1 View  Result Date: 05/27/2019 CLINICAL DATA:  Shortness of breath. EXAM: PORTABLE CHEST 1 VIEW COMPARISON:  05/25/2019 FINDINGS: Sternotomy wires and  left-sided pacemaker unchanged. Persistent right middle lobe/right base opacification with slightly improved aeration likely effusion with associated atelectasis as infection is possible in the right base. Mild stable prominence of the central pulmonary vessels. Cardiomediastinal silhouette and remainder of the exam is unchanged. IMPRESSION: Interval improved aeration right base with persistent opacification likely right-sided effusion with associated atelectasis although infection is possible. Mild vascular congestion. Electronically Signed   By: Marin Olp M.D.   On: 05/27/2019 09:24    Review of Systems  Constitutional: Positive for diaphoresis and malaise/fatigue.  HENT: Positive for congestion.   Eyes: Negative.   Respiratory: Positive for shortness of breath.   Cardiovascular: Positive for palpitations, orthopnea, leg swelling and PND.  Gastrointestinal: Negative.   Genitourinary: Negative.   Musculoskeletal: Negative.   Skin: Negative.   Neurological: Positive for weakness.  Endo/Heme/Allergies: Negative.   Psychiatric/Behavioral: Negative.    Blood pressure 114/65, pulse 81, temperature 98.1 F (36.7 C), temperature source Oral, resp. rate 19, height 5\' 7"  (1.702 m), weight 99.2 kg, SpO2 100 %. Physical Exam  Nursing note and vitals reviewed. Constitutional: He appears well-developed and well-nourished.  HENT:  Head: Normocephalic and atraumatic.  Eyes: Pupils are equal, round, and reactive to light. Conjunctivae and EOM are normal.  Neck: Normal range of motion. Neck supple.  Cardiovascular: Normal rate and regular rhythm. Exam reveals gallop.  Skin: Skin is warm and dry.  Psychiatric: He has a normal mood and affect.    Assessment/Plan: Congestive heart failure Shortness of breath Dyspnea Borderline troponins Cardiomyopathy COPD Diabetes GERD Obstructive sleep apnea Hyperlipidemia Former smoker Anemia Acute on chronic renal insufficiency Community-acquired  pneumonia Coronary artery disease . Plan Agree with admit to ICU Continue respiratory support Broad-spectrum antibiotic therapy for pneumonia Supplemental oxygen therapy for respiratory failure DVT prophylaxis Agree with echocardiogram for assessment of heart failure Diuretic therapy for heart failure Maintain diabetes management and control Transfer to telemetry   Continue inhalers for COPD  Kaelie Henigan D Damere Brandenburg 05/28/2019, 10:38 PM

## 2019-05-28 NOTE — Consult Note (Addendum)
PULMONARY PROGRESS NOTE  Requesting MD/Service: [facility] hospitalist Date of initial consultation: 05/27/19 Reason for consultation: Acute on chronic respiratory failure  PT PROFILE: 72 y.o. male former (remote) smoker with history of COPD, OSA, ischemic cardiomyopathy (LVEF 20% by echo 09/03/18), congestive heart failure adm 6/20 via ED with several days of increased SOB and productive cough but no fever.  Admission diagnoses of community-acquired pneumonia and CHF exacerbation.  Patient was admitted to [facility] floor but transferred to ICU/SDU 6/21 AM due to increased work of breathing and need for noninvasive ventilation  DATA:   INTERVAL:  SUBJ:  Somewhat improved and able to tolerate off BiPAP.  No new complaints.  Denies chest pain.  Vitals:   05/28/19 0600 05/28/19 0735 05/28/19 0800 05/28/19 1200  BP: (!) 120/53     Pulse: 73 71    Resp: 18 19    Temp:   98.2 F (36.8 C) 97.9 F (36.6 C)  TempSrc:   Axillary Oral  SpO2: 95% 96%    Weight:      Height:      3 LPM   EXAM:  Gen: NAD HEENT: NCAT, sclerae white Neck: Supple, JVP poorly visualized Lungs: No wheezes noted, faint bibasilar crackles Cardiovascular: Distant HS, regular, no M Abdomen: Soft, nontender, normal BS Ext: Cool, no edema Neuro: grossly intact Skin: Limited exam, no lesions noted    DATA:   BMP Latest Ref Rng & Units 05/28/2019 05/27/2019 05/26/2019  Glucose 70 - 99 mg/dL 154(H) 109(H) 112(H)  BUN 8 - 23 mg/dL 31(H) 30(H) 32(H)  Creatinine 0.61 - 1.24 mg/dL 1.31(H) 1.52(H) 1.92(H)  Sodium 135 - 145 mmol/L 143 138 138  Potassium 3.5 - 5.1 mmol/L 4.1 3.9 4.3  Chloride 98 - 111 mmol/L 105 100 101  CO2 22 - 32 mmol/L 25 26 26   Calcium 8.9 - 10.3 mg/dL 8.9 8.7(L) 8.3(L)    CBC Latest Ref Rng & Units 05/28/2019 05/27/2019 05/26/2019  WBC 4.0 - 10.5 K/uL 5.8 10.6(H) 8.1  Hemoglobin 13.0 - 17.0 g/dL 9.3(L) 9.6(L) 9.2(L)  Hematocrit 39.0 - 52.0 % 29.6(L) 31.0(L) 29.9(L)  Platelets 150 - 400 K/uL 219 238  227    CXR: Cardiomegaly, right pleural effusion, retrocardiac LLL atelectasis versus infiltrate  I have personally reviewed all chest radiographs reported above including CXRs and CT chest unless otherwise indicated  IMPRESSION:   Acute/chronic hypoxemic respiratory failure  - multifactorial. Major factor is pulmonary edema  - History of COPD, wheezing resolved  - PCT elevated. Concern for possible PNA     PLAN:  Continue PRN BiPAP Continue supplemental O2 to maintain SPO2 >90% Discontinue systemic steroids Continue nebulized steroids and bronchodilators Continue antibiotics for presumed CAP for now Continue diuresis - furosemide dose increased 6/22 I have requested cardiology consultation (Dr. Clayborn Bigness) to address severe dilated CM with pulmonary edema It would likely be appropriate to address advanced directives/goals of care during this hospitalization   Merton Border, MD PCCM service Mobile [phone] Pager [phone] 05/28/2019 1:08 PM

## 2019-05-28 NOTE — Progress Notes (Addendum)
[name] NAME: [name]    MR#:  [id number]  DATE OF BIRTH:  1945-12-11  SUBJECTIVE:  No events overnight, patient still requiring BiPAP   REVIEW OF SYSTEMS:  CONSTITUTIONAL: No fever, fatigue or weakness.  EYES: No blurred or double vision.  EARS, NOSE, AND THROAT: No tinnitus or ear pain.  RESPIRATORY: No cough, shortness of breath, wheezing or hemoptysis.  CARDIOVASCULAR: No chest pain, orthopnea, edema.  GASTROINTESTINAL: No nausea, vomiting, diarrhea or abdominal pain.  GENITOURINARY: No dysuria, hematuria.  ENDOCRINE: No polyuria, nocturia,  HEMATOLOGY: No anemia, easy bruising or bleeding SKIN: No rash or lesion. MUSCULOSKELETAL: No joint pain or arthritis.   NEUROLOGIC: No tingling, numbness, weakness.  PSYCHIATRY: No anxiety or depression.   ROS  DRUG ALLERGIES:   Allergies  Allergen Reactions  . Brilinta [Ticagrelor] Shortness Of Breath  . Tuberculin Tests Rash  . Benadryl [Diphenhydramine] Other (See Comments)    " Hyperactivity"  . Doxycycline Swelling    Pt went into pulmonary edema.  . Lopid [Gemfibrozil] Swelling    "I gain 1 pound a day for 30 days."    VITALS:  Blood pressure (!) 120/53, pulse 71, temperature 97.9 F (36.6 C), temperature source Oral, resp. rate 19, height 5\' 7"  (1.702 m), weight 99.2 kg, SpO2 96 %.  PHYSICAL EXAMINATION:  GENERAL:  73 y.o.-year-old patient lying in the bed with no acute distress.  EYES: Pupils equal, round, reactive to light and accommodation. No scleral icterus. Extraocular muscles intact.  HEENT: Head atraumatic, normocephalic. Oropharynx and nasopharynx clear.  NECK:  Supple, no jugular venous distention. No thyroid enlargement, no tenderness.  LUNGS: Normal breath sounds bilaterally, no wheezing, rales,rhonchi or crepitation. No use of accessory muscles of respiration.  CARDIOVASCULAR: S1, S2 normal. No murmurs, rubs, or gallops.  ABDOMEN: Soft,  nontender, nondistended. Bowel sounds present. No organomegaly or mass.  EXTREMITIES: No pedal edema, cyanosis, or clubbing.  NEUROLOGIC: Cranial nerves II through XII are intact. Muscle strength 5/5 in all extremities. Sensation intact. Gait not checked.  PSYCHIATRIC: The patient is alert and oriented x 3.  SKIN: No obvious rash, lesion, or ulcer.   Physical Exam LABORATORY PANEL:   CBC Recent Labs  Lab 05/28/19 0553  WBC 5.8  HGB 9.3*  HCT 29.6*  PLT 219   ------------------------------------------------------------------------------------------------------------------  Chemistries  Recent Labs  Lab 05/26/19 0125  05/28/19 0553  NA  --    < > 143  K  --    < > 4.1  CL  --    < > 105  CO2  --    < > 25  GLUCOSE  --    < > 154*  BUN  --    < > 31*  CREATININE  --    < > 1.31*  CALCIUM  --    < > 8.9  MG 2.7*  --   --   AST  --    < > 20  ALT  --    < > 14  ALKPHOS  --    < > 75  BILITOT  --    < > 0.9   < > = values in this interval not displayed.   ------------------------------------------------------------------------------------------------------------------  Cardiac Enzymes Recent Labs  Lab 05/26/19 0125 05/26/19 0708  TROPONINI 0.04* 0.03*   ------------------------------------------------------------------------------------------------------------------  RADIOLOGY:  Dg Chest Port 1 View  Result Date: 05/28/2019 CLINICAL DATA:  Respiratory failure. EXAM: PORTABLE CHEST 1 VIEW COMPARISON:  05/27/2019. FINDINGS: Cardiac pacer stable position. Prior CABG. Cardiomegaly with pulmonary venous congestion. Increase interstitial markings noted bilaterally suggesting interstitial edema. Pneumonitis cannot be excluded. Persistent atelectasis/infiltrate right lung base. Persistent right pleural effusion. No pneumothorax. IMPRESSION: 1. Cardiac pacer stable position. Prior CABG. Cardiomegaly with pulmonary venous congestion. 2. Increase interstitial markings noted  bilaterally suggesting interstitial edema. Pneumonitis cannot be excluded. 3. Persistent unchanged atelectasis/infiltrate right lung base. Persistent unchanged right pleural effusion is again noted. Electronically Signed   By: Marcello Moores  Register   On: 05/28/2019 07:17   Dg Chest Port 1 View  Result Date: 05/27/2019 CLINICAL DATA:  Shortness of breath. EXAM: PORTABLE CHEST 1 VIEW COMPARISON:  05/25/2019 FINDINGS: Sternotomy wires and left-sided pacemaker unchanged. Persistent right middle lobe/right base opacification with slightly improved aeration likely effusion with associated atelectasis as infection is possible in the right base. Mild stable prominence of the central pulmonary vessels. Cardiomediastinal silhouette and remainder of the exam is unchanged. IMPRESSION: Interval improved aeration right base with persistent opacification likely right-sided effusion with associated atelectasis although infection is possible. Mild vascular congestion. Electronically Signed   By: Marin Olp M.D.   On: 05/27/2019 09:24    ASSESSMENT AND PLAN:  *Acute on chronic hypoxic respiratory failure Stable Patient transferred to stepdown unit on May 27, 2019 given complaints of worsening shortness of breath, inability to be weaned off BiPAP Continue BiPAP/supplemental oxygen with weaning as tolerated, d/w intensivist/Dr. Alva Garnet, ABG normal, chest x-ray noted for improvement Note patient is on 5 L at night and oxygen as needed during the day  *Community-acquired pneumonia Resolving Continue pneumonia protocol, empiric IV Rocephin/Zithromax,Mucolytic therapy, cultures negative thus far   *Acute on chronic systolic congestive heart failure exacerbation  Resolving Most recent echocardiogram noted for EF of 20% with mild left and right atrial dilatation and moderate tricuspid regurgitation Continue CHF protocol, IV Lasix, I&Os, daily weights, Coreg/lisinopril if blood pressure will tolerate   *History of  hypertension Resolved Normotensive on Lasix without any other antihypertensive medication    *Coronary artery disease Statin therapy, DAPT with aspirin/Plavix, Coreg/lisinopril if blood pressure will tolerate  *Gout allopurinol  *COPD Inhaled corticosteroids, breathing treatments PRN  *BPH Flomax   *Type 2 diabetes mellitus Stable on current regiment  DVT prophylaxis-Lovenox subcu Disposition pending clinical course   all the records are reviewed and case discussed with Care Management/Social Workerr. Management plans discussed with the patient, family and they are in agreement.  CODE STATUS: full  TOTAL TIME TAKING CARE OF THIS PATIENT: 35 minutes.   POSSIBLE D/C IN 2-5 DAYS, DEPENDING ON CLINICAL CONDITION.   Avel Peace Waunita Sandstrom M.D on 05/28/2019   Between 7am to 6pm - Pager - [phone]  After 6pm go to [email or web address] - password [email or web address]  Office  [phone]  CC: Primary care physician; [name], MD  Note: This dictation was prepared with Dragon dictation along with smaller phrase technology. Any transcriptional errors that result from this process are unintentional.

## 2019-05-29 DIAGNOSIS — J9621 Acute and chronic respiratory failure with hypoxia: Secondary | ICD-10-CM

## 2019-05-29 LAB — GLUCOSE, CAPILLARY
Glucose-Capillary: 138 mg/dL — ABNORMAL HIGH (ref 70–99)
Glucose-Capillary: 164 mg/dL — ABNORMAL HIGH (ref 70–99)
Glucose-Capillary: 106 mg/dL — ABNORMAL HIGH (ref 70–99)
Glucose-Capillary: 137 mg/dL — ABNORMAL HIGH (ref 70–99)
Glucose-Capillary: 119 mg/dL — ABNORMAL HIGH (ref 70–99)

## 2019-05-29 LAB — PROCALCITONIN: Procalcitonin: <0.1 ng/mL

## 2019-05-29 LAB — BRAIN NATRIURETIC PEPTIDE: B Natriuretic Peptide: 2933 pg/mL — ABNORMAL HIGH (ref 0.0–100.0)

## 2019-05-29 LAB — BASIC METABOLIC PANEL
Anion gap: 15 (ref 5–15)
BUN: 34 mg/dL — ABNORMAL HIGH (ref 8–23)
CO2: 25 mmol/L (ref 22–32)
Calcium: 8.8 mg/dL — ABNORMAL LOW (ref 8.9–10.3)
Chloride: 99 mmol/L (ref 98–111)
Creatinine, Ser: 1.26 mg/dL — ABNORMAL HIGH (ref 0.61–1.24)
GFR calc Af Amer: >60 mL/min (ref >60)
GFR calc non Af Amer: 56 mL/min — ABNORMAL LOW (ref >60)
Glucose, Bld: 165 mg/dL — ABNORMAL HIGH (ref 70–99)
Potassium: 3.8 mmol/L (ref 3.5–5.1)
Sodium: 139 mmol/L (ref 135–145)

## 2019-05-29 MED ORDER — POTASSIUM CHLORIDE CRYS ER 20 MEQ PO TBCR
40.0000 meq | EXTENDED_RELEASE_TABLET | Freq: Two times a day (BID) | ORAL | Status: AC
  Administered 2019-05-29 (×2): 40 meq via ORAL
  Filled 2019-05-29 (×2): qty 2

## 2019-05-29 NOTE — Progress Notes (Signed)
[place] [facility] [place] NAME: [name]    MR#:  [id number]  DATE OF BIRTH:  [birth year]  SUBJECTIVE:  No events overnight, sitting in chair REVIEW OF SYSTEMS:  CONSTITUTIONAL: No fever, fatigue or weakness.  EYES: No blurred or double vision.  EARS, NOSE, AND THROAT: No tinnitus or ear pain.  RESPIRATORY: No cough, shortness of breath, wheezing or hemoptysis.  CARDIOVASCULAR: No chest pain, orthopnea, edema.  GASTROINTESTINAL: No nausea, vomiting, diarrhea or abdominal pain.  GENITOURINARY: No dysuria, hematuria.  ENDOCRINE: No polyuria, nocturia,  HEMATOLOGY: No anemia, easy bruising or bleeding SKIN: No rash or lesion. MUSCULOSKELETAL: No joint pain or arthritis.   NEUROLOGIC: No tingling, numbness, weakness.  PSYCHIATRY: No anxiety or depression.  ROS  DRUG ALLERGIES:   Allergies  Allergen Reactions  . Brilinta [Ticagrelor] Shortness Of Breath  . Tuberculin Tests Rash  . Benadryl [Diphenhydramine] Other (See Comments)    " Hyperactivity"  . Doxycycline Swelling    Pt went into pulmonary edema.  . Lopid [Gemfibrozil] Swelling    "I gain 1 pound a day for 30 days."    VITALS:  Blood pressure 123/71, pulse 79, temperature 97.8 F (36.6 C), temperature source Axillary, resp. rate 19, height 5\' 7"  (1.702 m), weight 99.2 kg, SpO2 100 %.  PHYSICAL EXAMINATION:  GENERAL:  73 y.o.-year-old patient lying in the bed with no acute distress.  EYES: Pupils equal, round, reactive to light and accommodation. No scleral icterus. Extraocular muscles intact.  HEENT: Head atraumatic, normocephalic. Oropharynx and nasopharynx clear.  NECK:  Supple, no jugular venous distention. No thyroid enlargement, no tenderness.  LUNGS: Normal breath sounds bilaterally, no wheezing, rales,rhonchi or crepitation. No use of accessory muscles of respiration.  CARDIOVASCULAR: S1, S2 normal. No murmurs, rubs, or gallops.  ABDOMEN: Soft, nontender,  nondistended. Bowel sounds present. No organomegaly or mass.  EXTREMITIES: No pedal edema, cyanosis, or clubbing.  NEUROLOGIC: Cranial nerves II through XII are intact. Muscle strength 5/5 in all extremities. Sensation intact. Gait not checked.  PSYCHIATRIC: The patient is alert and oriented x 3.  SKIN: No obvious rash, lesion, or ulcer.   Physical Exam LABORATORY PANEL:   CBC Recent Labs  Lab 05/28/19 0553  WBC 5.8  HGB 9.3*  HCT 29.6*  PLT 219   ------------------------------------------------------------------------------------------------------------------  Chemistries  Recent Labs  Lab 05/26/19 0125  05/28/19 0553 05/29/19 0414  NA  --    < > 143 139  K  --    < > 4.1 3.8  CL  --    < > 105 99  CO2  --    < > 25 25  GLUCOSE  --    < > 154* 165*  BUN  --    < > 31* 34*  CREATININE  --    < > 1.31* 1.26*  CALCIUM  --    < > 8.9 8.8*  MG 2.7*  --   --   --   AST  --    < > 20  --   ALT  --    < > 14  --   ALKPHOS  --    < > 75  --   BILITOT  --    < > 0.9  --    < > = values in this interval not displayed.   ------------------------------------------------------------------------------------------------------------------  Cardiac Enzymes Recent Labs  Lab 05/26/19 0125 05/26/19 0708  TROPONINI 0.04* 0.03*   ------------------------------------------------------------------------------------------------------------------  RADIOLOGY:  Dg  Chest Port 1 View  Result Date: 05/28/2019 CLINICAL DATA:  Respiratory failure. EXAM: PORTABLE CHEST 1 VIEW COMPARISON:  05/27/2019. FINDINGS: Cardiac pacer stable position. Prior CABG. Cardiomegaly with pulmonary venous congestion. Increase interstitial markings noted bilaterally suggesting interstitial edema. Pneumonitis cannot be excluded. Persistent atelectasis/infiltrate right lung base. Persistent right pleural effusion. No pneumothorax. IMPRESSION: 1. Cardiac pacer stable position. Prior CABG. Cardiomegaly with pulmonary  venous congestion. 2. Increase interstitial markings noted bilaterally suggesting interstitial edema. Pneumonitis cannot be excluded. 3. Persistent unchanged atelectasis/infiltrate right lung base. Persistent unchanged right pleural effusion is again noted. Electronically Signed   By: Marcello Moores  Register   On: 05/28/2019 07:17   ASSESSMENT AND PLAN:  *Acute on chronic hypoxic respiratory failure Patient transferred to stepdown unit on May 27, 2019 given complaints of worsening shortness of breath, now off BiPAP - down to 2 liters  *Community-acquired pneumonia - on PO Zithromax,Mucolytic therapy, cultures negative thus far   *Acute on chronic systolic congestive heart failure exacerbation  Most recent echocardiogram noted for EF of 20% with mild left and right atrial dilatation and moderate tricuspid regurgitation Continue CHF protocol, IV Lasix, I&Os, daily weights, Coreg/lisinopril if blood pressure will tolerate   *History of hypertension Normotensive on Lasix without any other antihypertensive medication    *Coronary artery disease Statin therapy, DAPT with aspirin/Plavix, Coreg/lisinopril if blood pressure will tolerate  *Gout allopurinol  *COPD Inhaled corticosteroids, breathing treatments PRN  *BPH Flomax   *Type 2 diabetes mellitus Stable on current regiment   PT eval - to decide dispo  Waiting for floor transfer  DVT prophylaxis-Lovenox subcu Disposition pending clinical course   all the records are reviewed and case discussed with Care Management/Social Workerr. Management plans discussed with the patient, nursing and they are in agreement.  CODE STATUS: full  TOTAL TIME TAKING CARE OF THIS PATIENT: 35 minutes.   POSSIBLE D/C IN 1-2 DAYS, DEPENDING ON CLINICAL CONDITION.   Max Sane M.D on 05/29/2019   Between 7am to 6pm - Pager - [phone]  After 6pm go to [email or web address] - password [email or web address] [facility]  Office   [phone]  CC: Primary care physician; [name], MD  Note: This dictation was prepared with Dragon dictation along with smaller phrase technology. Any transcriptional errors that result from this process are unintentional.

## 2019-05-29 NOTE — Progress Notes (Signed)
PULMONARY PROGRESS NOTE  Requesting MD/Service: [facility] hospitalist Date of initial consultation: 05/27/19 Reason for consultation: Acute on chronic respiratory failure  PT PROFILE: 73 y.o. male former (remote) smoker with history of COPD, OSA, ischemic cardiomyopathy (LVEF 20% by echo 09/03/18), congestive heart failure adm 6/20 via ED with several days of increased SOB and productive cough but no fever.  Admission diagnoses of community-acquired pneumonia and CHF exacerbation.  Patient was admitted to [place] floor but transferred to ICU/SDU 6/21 AM due to increased work of breathing and need for noninvasive ventilation  DATA:   INTERVAL:  SUBJ:  Convertible off BiPAP on [place] O2  Vitals:   05/29/19 1004 05/29/19 1111 05/29/19 1400 05/29/19 1438  BP: 122/73  123/71   Pulse: 82  82 79  Resp: (!) 27  (!) 27 19  Temp:      TempSrc:      SpO2: 94% 92% 100% 100%  Weight:      Height:      2 LPM   EXAM:  Gen: NAD HEENT: NCAT, sclerae white Neck: Supple, JVP poorly visualized Lungs: No wheezes, bibasilar crackles, distant BS Cardiovascular: Distant HS, regular, no M Abdomen: Soft, nontender, normal BS Ext: Cool, no edema Neuro: grossly intact Skin: Limited exam, no lesions noted  DATA:   BMP Latest Ref Rng & Units 05/29/2019 05/28/2019 05/27/2019  Glucose 70 - 99 mg/dL 165(H) 154(H) 109(H)  BUN 8 - 23 mg/dL 34(H) 31(H) 30(H)  Creatinine 0.61 - 1.24 mg/dL 1.26(H) 1.31(H) 1.52(H)  Sodium 135 - 145 mmol/L 139 143 138  Potassium 3.5 - 5.1 mmol/L 3.8 4.1 3.9  Chloride 98 - 111 mmol/L 99 105 100  CO2 22 - 32 mmol/L 25 25 26   Calcium 8.9 - 10.3 mg/dL 8.8(L) 8.9 8.7(L)    CBC Latest Ref Rng & Units 05/28/2019 05/27/2019 05/26/2019  WBC 4.0 - 10.5 K/uL 5.8 10.6(H) 8.1  Hemoglobin 13.0 - 17.0 g/dL 9.3(L) 9.6(L) 9.2(L)  Hematocrit 39.0 - 52.0 % 29.6(L) 31.0(L) 29.9(L)  Platelets 150 - 400 K/uL 219 238 227    CXR: No new film  I have personally reviewed all chest radiographs reported  above including CXRs and CT chest unless otherwise indicated  IMPRESSION:   Acute/chronic hypoxemic respiratory failure.  Improving - multifactorial. Major factor is pulmonary edema.  BNP remains markedly elevated - History of COPD, wheezing resolved - PCT elevated initially and now < 0.10.   H/O OSA - chronic CPAP @ 8 cm H2O with O2 bleed in  DM2, controlled   PLAN:  Continue supplemental O2 to maintain SPO2 >90% Continue nebulized steroids and bronchodilators Discontinue ceftriaxone.  Complete 5 days of azithromycin.   Continue diuresis - furosemide dose increased 6/22 Monitor BMET intermittently Monitor I/Os Correct electrolytes as indicated Cardiology following Transfer to [place] floor.   After transfer, PCCM will sign off. Please call if we can be of further assistance     I reiterate that it is appropriate to address goals of care given his very severe cardiomyopathy   Merton Border, MD PCCM service Mobile [phone] Pager [phone] 05/29/2019 3:59 PM

## 2019-05-30 LAB — BASIC METABOLIC PANEL
Anion gap: 12 (ref 5–15)
BUN: 32 mg/dL — ABNORMAL HIGH (ref 8–23)
CO2: 24 mmol/L (ref 22–32)
Calcium: 8.7 mg/dL — ABNORMAL LOW (ref 8.9–10.3)
Chloride: 105 mmol/L (ref 98–111)
Creatinine, Ser: 1.08 mg/dL (ref 0.61–1.24)
GFR calc Af Amer: >60 mL/min (ref >60)
GFR calc non Af Amer: >60 mL/min (ref >60)
Glucose, Bld: 130 mg/dL — ABNORMAL HIGH (ref 70–99)
Potassium: 4.1 mmol/L (ref 3.5–5.1)
Sodium: 141 mmol/L (ref 135–145)

## 2019-05-30 LAB — GLUCOSE, CAPILLARY
Glucose-Capillary: 135 mg/dL — ABNORMAL HIGH (ref 70–99)
Glucose-Capillary: 148 mg/dL — ABNORMAL HIGH (ref 70–99)
Glucose-Capillary: 126 mg/dL — ABNORMAL HIGH (ref 70–99)
Glucose-Capillary: 106 mg/dL — ABNORMAL HIGH (ref 70–99)

## 2019-05-30 MED ORDER — GABAPENTIN 300 MG PO CAPS
300.0000 mg | ORAL_CAPSULE | Freq: Two times a day (BID) | ORAL | Status: DC
  Administered 2019-05-30 – 2019-05-31 (×3): 300 mg via ORAL
  Filled 2019-05-30 (×3): qty 1

## 2019-05-30 MED ORDER — CLOPIDOGREL BISULFATE 75 MG PO TABS
75.0000 mg | ORAL_TABLET | Freq: Every day | ORAL | Status: DC
  Administered 2019-05-30 – 2019-05-31 (×2): 75 mg via ORAL
  Filled 2019-05-30 (×2): qty 1

## 2019-05-30 MED ORDER — FUROSEMIDE 20 MG PO TABS: 40.0000 mg | ORAL_TABLET | Freq: Every day | ORAL | Status: DC

## 2019-05-30 MED ORDER — FINASTERIDE 5 MG PO TABS
5.0000 mg | ORAL_TABLET | Freq: Every day | ORAL | Status: DC
  Administered 2019-05-30 – 2019-05-31 (×2): 5 mg via ORAL
  Filled 2019-05-30 (×2): qty 1

## 2019-05-30 MED ORDER — TORSEMIDE 20 MG PO TABS
20.0000 mg | ORAL_TABLET | Freq: Two times a day (BID) | ORAL | Status: DC
  Administered 2019-05-30 – 2019-05-31 (×2): 20 mg via ORAL
  Filled 2019-05-30 (×2): qty 1

## 2019-05-30 MED ORDER — ROSUVASTATIN CALCIUM 10 MG PO TABS
20.0000 mg | ORAL_TABLET | Freq: Every day | ORAL | Status: DC
  Administered 2019-05-30: 20 mg via ORAL
  Filled 2019-05-30: qty 2

## 2019-05-30 MED ORDER — FERROUS SULFATE 325 (65 FE) MG PO TABS
325.0000 mg | ORAL_TABLET | Freq: Two times a day (BID) | ORAL | Status: DC
  Administered 2019-05-30 – 2019-05-31 (×2): 325 mg via ORAL
  Filled 2019-05-30 (×2): qty 1

## 2019-05-30 MED ORDER — FLUOXETINE HCL 20 MG PO CAPS
20.0000 mg | ORAL_CAPSULE | Freq: Every day | ORAL | Status: DC
  Administered 2019-05-30 – 2019-05-31 (×2): 20 mg via ORAL
  Filled 2019-05-30 (×2): qty 1

## 2019-05-30 MED ORDER — CARVEDILOL 3.125 MG PO TABS
3.1250 mg | ORAL_TABLET | Freq: Two times a day (BID) | ORAL | Status: DC
  Administered 2019-05-30 – 2019-05-31 (×2): 3.125 mg via ORAL
  Filled 2019-05-30 (×2): qty 1

## 2019-05-30 MED ORDER — ASPIRIN 81 MG PO CHEW
81.0000 mg | CHEWABLE_TABLET | Freq: Every day | ORAL | Status: DC
  Administered 2019-05-30 – 2019-05-31 (×2): 81 mg via ORAL
  Filled 2019-05-30 (×2): qty 1

## 2019-05-30 MED ORDER — TAMSULOSIN HCL 0.4 MG PO CAPS
0.4000 mg | ORAL_CAPSULE | Freq: Every day | ORAL | Status: DC
  Administered 2019-05-30: 0.4 mg via ORAL
  Filled 2019-05-30: qty 1

## 2019-05-30 MED ORDER — RANOLAZINE ER 500 MG PO TB12
500.0000 mg | ORAL_TABLET | Freq: Two times a day (BID) | ORAL | Status: DC
  Administered 2019-05-30 – 2019-05-31 (×3): 500 mg via ORAL
  Filled 2019-05-30 (×4): qty 1

## 2019-05-30 NOTE — Progress Notes (Signed)
[place] [facility] [place] NAME: Kohl Polinsky    MR#:  [id number]  DATE OF BIRTH:  May 30, 1946  SUBJECTIVE:   Patient doing well this morning.  Awaiting physical therapy consultation.  Patient off of BiPAP and on nasal cannula.  Reports that he wears 1 to 3 L.  O.  REVIEW OF SYSTEMS:    Review of Systems  Constitutional: Negative for fever, chills weight loss HENT: Negative for ear pain, nosebleeds, congestion, facial swelling, rhinorrhea, neck pain, neck stiffness and ear discharge.   Respiratory: Negative for cough, shortness of breath, wheezing  Cardiovascular: Negative for chest pain, palpitations and leg swelling.  Gastrointestinal: Negative for heartburn, abdominal pain, vomiting, diarrhea or consitpation Genitourinary: Negative for dysuria, urgency, frequency, hematuria Musculoskeletal: Negative for back pain or joint pain Neurological: Negative for dizziness, seizures, syncope, focal weakness,  numbness and headaches.  Hematological: Does not bruise/bleed easily.  Psychiatric/Behavioral: Negative for hallucinations, confusion, dysphoric mood    Tolerating Diet: yes      DRUG ALLERGIES:   Allergies  Allergen Reactions  . Brilinta [Ticagrelor] Shortness Of Breath  . Tuberculin Tests Rash  . Benadryl [Diphenhydramine] Other (See Comments)    " Hyperactivity"  . Doxycycline Swelling    Pt went into pulmonary edema.  . Lopid [Gemfibrozil] Swelling    "I gain 1 pound a day for 30 days."    VITALS:  Blood pressure 131/74, pulse 87, temperature 98.4 F (36.9 C), temperature source Oral, resp. rate (!) 23, height 5\' 7"  (1.702 m), weight 99.2 kg, SpO2 94 %.  PHYSICAL EXAMINATION:  Constitutional: Appears well-developed and well-nourished. No distress. HENT: Normocephalic. Marland Kitchen Oropharynx is clear and moist.  Eyes: Conjunctivae and EOM are normal. PERRLA, no scleral icterus.  Neck: Normal ROM. Neck supple. No JVD. No tracheal  deviation. CVS: RRR, S1/S2 +, no murmurs, no gallops, no carotid bruit.  Pulmonary: Effort and breath sounds normal, no stridor, rhonchi, wheezes, rales.  Abdominal: Soft. BS +,  no distension, tenderness, rebound or guarding.  Musculoskeletal: Normal range of motion. No edema and no tenderness.  Neuro: Alert. CN 2-12 grossly intact. No focal deficits. Skin: Skin is warm and dry. No rash noted. Psychiatric: Normal mood and affect.      LABORATORY PANEL:   CBC Recent Labs  Lab 05/28/19 0553  WBC 5.8  HGB 9.3*  HCT 29.6*  PLT 219   ------------------------------------------------------------------------------------------------------------------  Chemistries  Recent Labs  Lab 05/26/19 0125  05/28/19 0553  05/30/19 0431  NA  --    < > 143   < > 141  K  --    < > 4.1   < > 4.1  CL  --    < > 105   < > 105  CO2  --    < > 25   < > 24  GLUCOSE  --    < > 154*   < > 130*  BUN  --    < > 31*   < > 32*  CREATININE  --    < > 1.31*   < > 1.08  CALCIUM  --    < > 8.9   < > 8.7*  MG 2.7*  --   --   --   --   AST  --    < > 20  --   --   ALT  --    < > 14  --   --   ALKPHOS  --    < >  75  --   --   BILITOT  --    < > 0.9  --   --    < > = values in this interval not displayed.   ------------------------------------------------------------------------------------------------------------------  Cardiac Enzymes Recent Labs  Lab 05/25/19 2020 05/26/19 0125 05/26/19 0708  TROPONINI 0.04* 0.04* 0.03*   ------------------------------------------------------------------------------------------------------------------  RADIOLOGY:  No results found.   ASSESSMENT AND PLAN:    73 year old male with chronic hypoxic respiratory failure who presented with shortness of breath.  1.  Acute on chronic hypoxic respiratory failure in the setting of community-acquired pneumonia as well as acute on chronic systolic heart failure: Patient transitioned off of BiPAP and on baseline nasal  cannula.   2.  Community-acquired pneumonia: Continue antibiotics  3.  Acute on chronic systolic heart failure ejection fraction 20% with mild left and right atrial dilation and moderate tricuspid regurgitation: Continue monitor intake and output with daily weight Patient currently euvolemic ACE inhibitor indicated given low EF but will continue to evaluate blood pressure to see if this can be tolerated. I will restart Coreg and torsemide.  4.  Diabetes: Continue sliding scale    Physical therapy consultation for discharge planning Management plans discussed with the patient and he is in agreement.  CODE STATUS: full  TOTAL TIME TAKING CARE OF THIS PATIENT: 27 minutes.     POSSIBLE D/C tomorrow, DEPENDING ON CLINICAL CONDITION.   Bettey Costa M.D on 05/30/2019 at 12:11 PM  Between 7am to 6pm - Pager - [phone] After 6pm go to [email or web address] - password [email or web address] [facility]  Office  [phone]  CC: Primary care physician; [name], MD  Note: This dictation was prepared with Dragon dictation along with smaller phrase technology. Any transcriptional errors that result from this process are unintentional.

## 2019-05-30 NOTE — Evaluation (Addendum)
Physical Therapy Evaluation Patient Details Name: [name] MRN: [id number] DOB: [birth year] Today's Date: 05/30/2019   History of Present Illness  Patient is a 73 y/o male that presents with shortness of breath, initially to the floor then ICU due to need for continuous BiPap. He has since been weaned off BiPap, noted to have CAP and atelectasis of R lung base. Noted to have 20% EF, CHF.  Clinical Impression  Patient seen in the ICU after being transferred from step down unit. He has a history of dyspnea and dyspnea with exertion, is noted to have significant reduction in SpO2 during ambulation (from 98% to 92% on 2L). He has been ambulating at home without device, though appears much more efficient with RW this session as he had lateral sway and decreased stride length bilaterally. He was able to transfer without physical assistance, and was able to ambulate household distances with RW in this session. He would likely benefit from HHPT consult to address balance deficits and general deconditioning.     Follow Up Recommendations Home health PT    Equipment Recommendations  Rolling walker with 5" wheels    Recommendations for Other Services       Precautions / Restrictions Precautions Precautions: Fall Restrictions Weight Bearing Restrictions: No      Mobility  Bed Mobility               General bed mobility comments: Did not observe, patient in recliner when therapist entered the room.  Transfers Overall transfer level: Needs assistance Equipment used: Rolling walker (2 wheeled) Transfers: Sit to/from Stand Sit to Stand: Min guard         General transfer comment: Patient is able to perform transfer initially with lateral sway, no overt loss of balance noted.  Ambulation/Gait Ambulation/Gait assistance: Min guard Gait Distance (Feet): 120 Feet Assistive device: Rolling walker (2 wheeled) Gait Pattern/deviations: Step-through pattern;Decreased step length -  right;Decreased step length - left   Gait velocity interpretation: <1.31 ft/sec, indicative of household ambulator General Gait Details: Cued patient to step through gait pattern with increased stride length. On 2L of O2 patient desatted to 90%, recovered well.  Stairs            Wheelchair Mobility    Modified Rankin (Stroke Patients Only)       Balance Overall balance assessment: Mild deficits observed, not formally tested                                           Pertinent Vitals/Pain Pain Assessment: No/denies pain    Home Living Family/patient expects to be discharged to:: Private residence Living Arrangements: Spouse/significant other Available Help at Discharge: Family Type of Home: House Home Access: Stairs to enter Entrance Stairs-Rails: Can reach both Entrance Stairs-Number of Steps: 2 Home Layout: One level Home Equipment: None      Prior Function Level of Independence: Independent         Comments: Per patient he has recently been able to ambulate indepednently for short distances and to MD office visits. Home O2 at night only prior to admission.     Hand Dominance        Extremity/Trunk Assessment   Upper Extremity Assessment Upper Extremity Assessment: Overall WFL for tasks assessed    Lower Extremity Assessment Lower Extremity Assessment: Overall WFL for tasks assessed       Communication  Communication: No difficulties  Cognition Arousal/Alertness: Awake/alert Behavior During Therapy: WFL for tasks assessed/performed Overall Cognitive Status: Within Functional Limits for tasks assessed                                        General Comments      Exercises Other Exercises Other Exercises: Multiple bouts of ambulation with RW for ~100' with notable reduction in O2 sats to 92% after each with quick recovery to 97-98% on 2L in between bouts.   Assessment/Plan    PT Assessment Patient needs  continued PT services  PT Problem List Decreased strength;Decreased balance;Decreased activity tolerance;Decreased knowledge of use of DME;Cardiopulmonary status limiting activity;Decreased mobility;Decreased safety awareness       PT Treatment Interventions DME instruction;Therapeutic exercise;Balance training;Gait training;Stair training;Therapeutic activities;Patient/family education    PT Goals (Current goals can be found in the Care Plan section)  Acute Rehab PT Goals Patient Stated Goal: To return to home mobility. PT Goal Formulation: With patient Time For Goal Achievement: 06/13/19 Potential to Achieve Goals: Good    Frequency Min 2X/week   Barriers to discharge        Co-evaluation               AM-PAC PT "6 Clicks" Mobility  Outcome Measure Help needed turning from your back to your side while in a flat bed without using bedrails?: None Help needed moving from lying on your back to sitting on the side of a flat bed without using bedrails?: None Help needed moving to and from a bed to a chair (including a wheelchair)?: None Help needed standing up from a chair using your arms (e.g., wheelchair or bedside chair)?: None Help needed to walk in hospital room?: A Little Help needed climbing 3-5 steps with a railing? : A Little 6 Click Score: 22    End of Session Equipment Utilized During Treatment: Gait belt;Oxygen Activity Tolerance: Patient tolerated treatment well;Patient limited by fatigue Patient left: in chair;with call bell/phone within reach Nurse Communication: Mobility status PT Visit Diagnosis: Muscle weakness (generalized) (M62.81)    Time: 3545-6256 Acute Care PT Time: 23 minutes    Charges:   PT Evaluation $PT Eval Moderate Complexity: 1 Mod PT Treatments $Therapeutic Exercise: 8-22 mins    Royce Macadamia PT, DPT, CSCS       05/30/2019, 4:05 PM   Addendum: Time correction Royce Macadamia PT, DPT, CSCS

## 2019-05-30 NOTE — Plan of Care (Signed)
  Problem: Activity: Goal: Ability to tolerate increased activity will improve Outcome: Progressing   Problem: Clinical Measurements: Goal: Ability to maintain a body temperature in the normal range will improve Outcome: Progressing   Problem: Respiratory: Goal: Ability to maintain adequate ventilation will improve Outcome: Progressing Goal: Ability to maintain a clear airway will improve Outcome: Progressing   Problem: Activity: Goal: Risk for activity intolerance will decrease Outcome: Progressing   Problem: Nutrition: Goal: Adequate nutrition will be maintained Outcome: Progressing   Problem: Coping: Goal: Level of anxiety will decrease Outcome: Progressing   Problem: Elimination: Goal: Will not experience complications related to bowel motility Outcome: Progressing Goal: Will not experience complications related to urinary retention Outcome: Progressing   Problem: Pain Managment: Goal: General experience of comfort will improve Outcome: Progressing   Problem: Safety: Goal: Ability to remain free from injury will improve Outcome: Progressing   Problem: Skin Integrity: Goal: Risk for impaired skin integrity will decrease Outcome: Progressing

## 2019-05-30 NOTE — Progress Notes (Signed)
Transfer order to MedSurg floor was placed yesterday.  He is still awaiting a bed.  PCCM did not see officially today.  We are available as needed while he is in ICU/SDU.  After transfer, PCCM will sign off. Please call if we can be of further assistance.  Merton Border, MD PCCM service Mobile [phone] Pager [phone] 05/30/2019 11:54 AM

## 2019-05-31 LAB — BASIC METABOLIC PANEL
Anion gap: 10 (ref 5–15)
BUN: 27 mg/dL — ABNORMAL HIGH (ref 8–23)
CO2: 26 mmol/L (ref 22–32)
Calcium: 8.4 mg/dL — ABNORMAL LOW (ref 8.9–10.3)
Chloride: 105 mmol/L (ref 98–111)
Creatinine, Ser: 1.12 mg/dL (ref 0.61–1.24)
GFR calc Af Amer: >60 mL/min (ref >60)
GFR calc non Af Amer: >60 mL/min (ref >60)
Glucose, Bld: 120 mg/dL — ABNORMAL HIGH (ref 70–99)
Potassium: 3.5 mmol/L (ref 3.5–5.1)
Sodium: 141 mmol/L (ref 135–145)

## 2019-05-31 LAB — CBC
HCT: 30 % — ABNORMAL LOW (ref 39.0–52.0)
Hemoglobin: 9.2 g/dL — ABNORMAL LOW (ref 13.0–17.0)
MCH: 26.8 pg (ref 26.0–34.0)
MCHC: 30.7 g/dL (ref 30.0–36.0)
MCV: 87.5 fL (ref 80.0–100.0)
Platelets: 233 10*3/uL (ref 150–400)
RBC: 3.43 MIL/uL — ABNORMAL LOW (ref 4.22–5.81)
RDW: 17.5 % — ABNORMAL HIGH (ref 11.5–15.5)
WBC: 7.6 10*3/uL (ref 4.0–10.5)
nRBC: 0 % (ref 0.0–0.2)

## 2019-05-31 LAB — GLUCOSE, CAPILLARY: Glucose-Capillary: 109 mg/dL — ABNORMAL HIGH (ref 70–99)

## 2019-05-31 MED ORDER — INSULIN GLARGINE 100 UNIT/ML ~~LOC~~ SOLN: 25.0000 [IU] | Freq: Every day | SUBCUTANEOUS | 11 refills | Status: DC

## 2019-05-31 NOTE — Progress Notes (Signed)
Transported via wheelchair with nurse to front entrance and discharged home with wife and daughter. Discharge instructions provided to pt by nurse and all questions answered. Pt verbalizes no further needs at this time. All belongings returned to pt.

## 2019-05-31 NOTE — Discharge Summary (Signed)
[facility] [place] NAME: [name]    MR#:  [id number]  DATE OF BIRTH:  04/11/1946  DATE OF ADMISSION:  05/25/2019 ADMITTING PHYSICIAN: Christel Mormon, MD  DATE OF DISCHARGE: 05/31/2019 10:05 AM  PRIMARY CARE PHYSICIAN: [name], MD    ADMISSION DIAGNOSIS:  Community acquired pneumonia, unspecified laterality [J18.9]  DISCHARGE DIAGNOSIS:  Active Problems:   CAP (community acquired pneumonia)   SECONDARY DIAGNOSIS:   Past Medical History:  Diagnosis Date  . Anemia   . Cancer ([place]) 12/2013   prostate  . Cardiogenic pulmonary edema ([place]) 12/19/2014  . Cardiomyopathy, ischemic   . CHF (congestive heart failure) ([place])   . COPD (chronic obstructive pulmonary disease) ([place])   . Depression   . Diabetes mellitus without complication ([place])   . GERD (gastroesophageal reflux disease)   . Gout   . Hypercholesteremia   . Hypertension   . Myocardial infarction ([place]) [id number]  . OSA on CPAP   . Shortness of breath dyspnea     HOSPITAL COURSE:   73 year old male with chronic hypoxic respiratory failure who presented with shortness of breath.  1.  Acute on chronic hypoxic respiratory failure in the setting of community-acquired pneumonia as well as acute on chronic systolic heart failure: Patient transitioned off of BiPAP and on baseline nasal cannula.   2.  Community-acquired pneumonia: He has completed course of treatment.  3.  Acute on chronic systolic heart failure ejection fraction 20% with mild left and right atrial dilation and moderate tricuspid regurgitation: Patient is currently euvolemic ACE inhibitor indicated given low EF but due to his low/normal blood pressure this has been deferred. He will Torsemide and Coreg.  4.  Diabetes: Continue ADA diet with close outpatient follow up.  DISCHARGE CONDITIONS AND DIET:   Stable Heart healthy and diabetic diet  CONSULTS OBTAINED:    DRUG ALLERGIES:    Allergies  Allergen Reactions  . Brilinta [Ticagrelor] Shortness Of Breath  . Tuberculin Tests Rash  . Benadryl [Diphenhydramine] Other (See Comments)    " Hyperactivity"  . Doxycycline Swelling    Pt went into pulmonary edema.  . Lopid [Gemfibrozil] Swelling    "I gain 1 pound a day for 30 days."    DISCHARGE MEDICATIONS:   Allergies as of 05/31/2019      Reactions   Brilinta [ticagrelor] Shortness Of Breath   Tuberculin Tests Rash   Benadryl [diphenhydramine] Other (See Comments)   " Hyperactivity"   Doxycycline Swelling   Pt went into pulmonary edema.   Lopid [gemfibrozil] Swelling   "I gain 1 pound a day for 30 days."      Medication List    TAKE these medications   acetaminophen 500 MG tablet Commonly known as: TYLENOL Take 1,000 mg by mouth daily as needed for moderate pain.   albuterol 108 (90 Base) MCG/ACT inhaler Commonly known as: VENTOLIN HFA Inhale 2 puffs into the lungs every 6 (six) hours as needed for wheezing or shortness of breath.   allopurinol 100 MG tablet Commonly known as: ZYLOPRIM Take 100 mg by mouth daily.   aspirin 81 MG chewable tablet Chew 1 tablet (81 mg total) by mouth daily.   budesonide-formoterol 80-4.5 MCG/ACT inhaler Commonly known as: SYMBICORT Inhale 2 puffs into the lungs 2 (two) times daily.   calcium carbonate 500 MG chewable tablet Commonly known as: TUMS - dosed in mg elemental calcium Chew 1 tablet by mouth as needed for indigestion or heartburn.  calcium carbonate 600 MG tablet Commonly known as: OS-CAL Take 600 mg by mouth 2 (two) times daily.   carvedilol 3.125 MG tablet Commonly known as: COREG Take 1 tablet (3.125 mg total) by mouth 2 (two) times daily with a meal.   CENTRUM SILVER PO Take 1 tablet by mouth every morning.   PreserVision AREDS 2 Caps Take 1 capsule by mouth daily. For macular degeneration- eye vitamins   clopidogrel 75 MG tablet Commonly known as: PLAVIX Take 75 mg by mouth daily.    CO-ENZYME Q10 PO Take 30 mg by mouth daily.   ferrous sulfate 325 (65 FE) MG tablet Take 325 mg by mouth 2 (two) times daily with a meal.   finasteride 5 MG tablet Commonly known as: PROSCAR Take 5 mg by mouth daily.   FLUoxetine 20 MG capsule Commonly known as: PROZAC Take 20 mg by mouth daily.   gabapentin 300 MG capsule Commonly known as: NEURONTIN Take 300 mg by mouth 2 (two) times daily.   guaiFENesin 600 MG 12 hr tablet Commonly known as: MUCINEX Take 600 mg by mouth 2 (two) times daily.   insulin glargine 100 UNIT/ML injection Commonly known as: LANTUS Inject 0.25 mLs (25 Units total) into the skin at bedtime. What changed: how much to take   insulin lispro 100 UNIT/ML KiwkPen Commonly known as: HUMALOG Inject 0-15 Units into the skin 3 (three) times daily with meals. About 25-30 units total for the day   lansoprazole 30 MG capsule Commonly known as: PREVACID Take 60 mg by mouth 2 (two) times daily.   loratadine 10 MG tablet Commonly known as: CLARITIN Take 10 mg by mouth daily.   magnesium oxide 400 (241.3 Mg) MG tablet Commonly known as: MAG-OX TAKE ONE TABLET BY MOUTH TWICE A DAY   nitroGLYCERIN 0.4 MG SL tablet Commonly known as: NITROSTAT Place 0.4 mg under the tongue every 5 (five) minutes as needed for chest pain.   ranolazine 500 MG 12 hr tablet Commonly known as: RANEXA Take 1 tablet (500 mg total) by mouth 2 (two) times daily.   rosuvastatin 20 MG tablet Commonly known as: CRESTOR Take 20 mg by mouth daily.   tamsulosin 0.4 MG Caps capsule Commonly known as: FLOMAX Take 0.4 mg by mouth at bedtime.   tiotropium 18 MCG inhalation capsule Commonly known as: SPIRIVA Place 1 capsule (18 mcg total) into inhaler and inhale daily.   torsemide 20 MG tablet Commonly known as: DEMADEX Take 20 mg by mouth 2 (two) times daily.            Durable Medical Equipment  (From admission, onward)         Start     Ordered   05/31/19 0857   For home use only DME oxygen  Once    Question Answer Comment  Length of Need Lifetime   Mode or (Route) Nasal cannula   Liters per Minute 2   Frequency Only at night (stationary unit needed)   Oxygen conserving device Yes   Oxygen delivery system Gas      05/31/19 0857            Today   CHIEF COMPLAINT:  Patient is ready to be discharged.  Denies shortness of breath or wheezing.   VITAL SIGNS:  Blood pressure (!) 116/59, pulse 65, temperature 97.7 F (36.5 C), temperature source Axillary, resp. rate 18, height 5\' 7"  (1.702 m), weight 99.2 kg, SpO2 96 %.   REVIEW OF SYSTEMS:  Review  of Systems  Constitutional: Negative.  Negative for chills, fever and malaise/fatigue.  HENT: Negative.  Negative for ear discharge, ear pain, hearing loss, nosebleeds and sore throat.   Eyes: Negative.  Negative for blurred vision and pain.  Respiratory: Negative.  Negative for cough, hemoptysis, shortness of breath and wheezing.   Cardiovascular: Negative.  Negative for chest pain, palpitations and leg swelling.  Gastrointestinal: Negative.  Negative for abdominal pain, blood in stool, diarrhea, nausea and vomiting.  Genitourinary: Negative.  Negative for dysuria.  Musculoskeletal: Negative.  Negative for back pain.  Skin: Negative.   Neurological: Negative for dizziness, tremors, speech change, focal weakness, seizures and headaches.  Endo/Heme/Allergies: Negative.  Does not bruise/bleed easily.  Psychiatric/Behavioral: Negative.  Negative for depression, hallucinations and suicidal ideas.     PHYSICAL EXAMINATION:  GENERAL:  73 y.o.-year-old patient lying in the bed with no acute distress.  NECK:  Supple, no jugular venous distention. No thyroid enlargement, no tenderness.  LUNGS: Normal breath sounds bilaterally, no wheezing, rales,rhonchi  No use of accessory muscles of respiration.  CARDIOVASCULAR: S1, S2 normal. No murmurs, rubs, or gallops.  ABDOMEN: Soft, non-tender,  non-distended. Bowel sounds present. No organomegaly or mass.  EXTREMITIES: No pedal edema, cyanosis, or clubbing.  PSYCHIATRIC: The patient is alert and oriented x 3.  SKIN: No obvious rash, lesion, or ulcer.   DATA REVIEW:   CBC Recent Labs  Lab 05/31/19 0350  WBC 7.6  HGB 9.2*  HCT 30.0*  PLT 233    Chemistries  Recent Labs  Lab 05/26/19 0125  05/28/19 0553  05/31/19 0350  NA  --    < > 143   < > 141  K  --    < > 4.1   < > 3.5  CL  --    < > 105   < > 105  CO2  --    < > 25   < > 26  GLUCOSE  --    < > 154*   < > 120*  BUN  --    < > 31*   < > 27*  CREATININE  --    < > 1.31*   < > 1.12  CALCIUM  --    < > 8.9   < > 8.4*  MG 2.7*  --   --   --   --   AST  --    < > 20  --   --   ALT  --    < > 14  --   --   ALKPHOS  --    < > 75  --   --   BILITOT  --    < > 0.9  --   --    < > = values in this interval not displayed.    Cardiac Enzymes Recent Labs  Lab 05/25/19 2020 05/26/19 0125 05/26/19 0708  TROPONINI 0.04* 0.04* 0.03*    Microbiology Results  @MICRORSLT48 @  RADIOLOGY:  No results found.    Allergies as of 05/31/2019      Reactions   Brilinta [ticagrelor] Shortness Of Breath   Tuberculin Tests Rash   Benadryl [diphenhydramine] Other (See Comments)   " Hyperactivity"   Doxycycline Swelling   Pt went into pulmonary edema.   Lopid [gemfibrozil] Swelling   "I gain 1 pound a day for 30 days."      Medication List    TAKE these medications   acetaminophen 500 MG tablet Commonly known as:  TYLENOL Take 1,000 mg by mouth daily as needed for moderate pain.   albuterol 108 (90 Base) MCG/ACT inhaler Commonly known as: VENTOLIN HFA Inhale 2 puffs into the lungs every 6 (six) hours as needed for wheezing or shortness of breath.   allopurinol 100 MG tablet Commonly known as: ZYLOPRIM Take 100 mg by mouth daily.   aspirin 81 MG chewable tablet Chew 1 tablet (81 mg total) by mouth daily.   budesonide-formoterol 80-4.5 MCG/ACT  inhaler Commonly known as: SYMBICORT Inhale 2 puffs into the lungs 2 (two) times daily.   calcium carbonate 500 MG chewable tablet Commonly known as: TUMS - dosed in mg elemental calcium Chew 1 tablet by mouth as needed for indigestion or heartburn.   calcium carbonate 600 MG tablet Commonly known as: OS-CAL Take 600 mg by mouth 2 (two) times daily.   carvedilol 3.125 MG tablet Commonly known as: COREG Take 1 tablet (3.125 mg total) by mouth 2 (two) times daily with a meal.   CENTRUM SILVER PO Take 1 tablet by mouth every morning.   PreserVision AREDS 2 Caps Take 1 capsule by mouth daily. For macular degeneration- eye vitamins   clopidogrel 75 MG tablet Commonly known as: PLAVIX Take 75 mg by mouth daily.   CO-ENZYME Q10 PO Take 30 mg by mouth daily.   ferrous sulfate 325 (65 FE) MG tablet Take 325 mg by mouth 2 (two) times daily with a meal.   finasteride 5 MG tablet Commonly known as: PROSCAR Take 5 mg by mouth daily.   FLUoxetine 20 MG capsule Commonly known as: PROZAC Take 20 mg by mouth daily.   gabapentin 300 MG capsule Commonly known as: NEURONTIN Take 300 mg by mouth 2 (two) times daily.   guaiFENesin 600 MG 12 hr tablet Commonly known as: MUCINEX Take 600 mg by mouth 2 (two) times daily.   insulin glargine 100 UNIT/ML injection Commonly known as: LANTUS Inject 0.25 mLs (25 Units total) into the skin at bedtime. What changed: how much to take   insulin lispro 100 UNIT/ML KiwkPen Commonly known as: HUMALOG Inject 0-15 Units into the skin 3 (three) times daily with meals. About 25-30 units total for the day   lansoprazole 30 MG capsule Commonly known as: PREVACID Take 60 mg by mouth 2 (two) times daily.   loratadine 10 MG tablet Commonly known as: CLARITIN Take 10 mg by mouth daily.   magnesium oxide 400 (241.3 Mg) MG tablet Commonly known as: MAG-OX TAKE ONE TABLET BY MOUTH TWICE A DAY   nitroGLYCERIN 0.4 MG SL tablet Commonly known as:  NITROSTAT Place 0.4 mg under the tongue every 5 (five) minutes as needed for chest pain.   ranolazine 500 MG 12 hr tablet Commonly known as: RANEXA Take 1 tablet (500 mg total) by mouth 2 (two) times daily.   rosuvastatin 20 MG tablet Commonly known as: CRESTOR Take 20 mg by mouth daily.   tamsulosin 0.4 MG Caps capsule Commonly known as: FLOMAX Take 0.4 mg by mouth at bedtime.   tiotropium 18 MCG inhalation capsule Commonly known as: SPIRIVA Place 1 capsule (18 mcg total) into inhaler and inhale daily.   torsemide 20 MG tablet Commonly known as: DEMADEX Take 20 mg by mouth 2 (two) times daily.            Durable Medical Equipment  (From admission, onward)         Start     Ordered   05/31/19 0857  For home use only  DME oxygen  Once    Question Answer Comment  Length of Need Lifetime   Mode or (Route) Nasal cannula   Liters per Minute 2   Frequency Only at night (stationary unit needed)   Oxygen conserving device Yes   Oxygen delivery system Gas      05/31/19 0857              Management plans discussed with the patient and he is in agreement. Stable for discharge home with [facility]  Patient should follow up with PCP  CODE STATUS:     Code Status Orders  (From admission, onward)         Start     Ordered   05/25/19 2331  Full code  Continuous     05/25/19 2330        Code Status History    Date Active Date Inactive Code Status Order ID Comments User Context   01/08/2019 0417 01/10/2019 1934 Full Code [id number]  Sedalia Muta, MD ED   12/10/2018 0047 12/12/2018 2127 Full Code [id number]  Salary, Avel Peace, MD Inpatient   09/03/2018 0037 09/05/2018 1912 Full Code [id number]  Amelia Jo, MD Inpatient   06/06/2018 1529 06/08/2018 1410 Full Code [id number]  Epifanio Lesches, MD ED   02/18/2018 1248 02/19/2018 1854 Full Code [id number]  Idelle Crouch, MD Inpatient   12/30/2017 0305 01/01/2018 1609 Full Code [id number]  Harrie Foreman, MD ED   12/05/2017  1105 12/06/2017 1659 Full Code [id number]  Harrie Foreman, MD ED   07/17/2017 0439 07/18/2017 1759 Full Code [id number]  Harrie Foreman, MD Inpatient   07/01/2017 0119 07/02/2017 1546 Full Code [id number]  Lance Coon, MD Inpatient   04/20/2017 0232 04/21/2017 1816 Full Code [id number]  Saundra Shelling, MD Inpatient   01/31/2016 0143 02/02/2016 2111 Full Code [id number]  Hillary Bow, MD ED   12/12/2015 1627 12/13/2015 1529 Full Code [id number]  [name], MD Inpatient   Advance Care Planning Activity      TOTAL TIME TAKING CARE OF THIS PATIENT: 38 minutes.    Note: This dictation was prepared with Dragon dictation along with smaller phrase technology. Any transcriptional errors that result from this process are unintentional.  Bettey Costa M.D on 05/31/2019 at 10:32 AM  Between 7am to 6pm - Pager - [phone] After 6pm go to [email or web address] - password [email or web address] [facility]  Office  [phone]  CC: Primary care physician; [name], MD

## 2019-05-31 NOTE — Discharge Instructions (Signed)
Increase activity slowly. Use your walker for balance.

## 2019-06-04 DIAGNOSIS — J189 Pneumonia, unspecified organism: Secondary | ICD-10-CM | POA: Diagnosis not present

## 2019-06-04 DIAGNOSIS — I5022 Chronic systolic (congestive) heart failure: Secondary | ICD-10-CM | POA: Diagnosis not present

## 2019-06-11 DIAGNOSIS — I5022 Chronic systolic (congestive) heart failure: Secondary | ICD-10-CM | POA: Diagnosis not present

## 2019-06-20 ENCOUNTER — Telehealth: Payer: Self-pay | Admitting: Student

## 2019-06-20 NOTE — Telephone Encounter (Signed)
Palliative NP called to check on patient; he had been hospitalized recently. Follow up appointment made for 06/27/2019 at 2pm for advanced care planning.

## 2019-06-22 DIAGNOSIS — E119 Type 2 diabetes mellitus without complications: Secondary | ICD-10-CM | POA: Diagnosis not present

## 2019-06-25 DIAGNOSIS — I4901 Ventricular fibrillation: Secondary | ICD-10-CM | POA: Diagnosis not present

## 2019-06-25 DIAGNOSIS — I472 Ventricular tachycardia: Secondary | ICD-10-CM | POA: Diagnosis not present

## 2019-06-25 DIAGNOSIS — Z4502 Encounter for adjustment and management of automatic implantable cardiac defibrillator: Secondary | ICD-10-CM | POA: Diagnosis not present

## 2019-06-25 DIAGNOSIS — I25810 Atherosclerosis of coronary artery bypass graft(s) without angina pectoris: Secondary | ICD-10-CM | POA: Diagnosis not present

## 2019-06-25 DIAGNOSIS — I255 Ischemic cardiomyopathy: Secondary | ICD-10-CM | POA: Diagnosis not present

## 2019-06-25 DIAGNOSIS — Z95810 Presence of automatic (implantable) cardiac defibrillator: Secondary | ICD-10-CM | POA: Diagnosis not present

## 2019-06-25 DIAGNOSIS — I5022 Chronic systolic (congestive) heart failure: Secondary | ICD-10-CM | POA: Diagnosis not present

## 2019-06-27 ENCOUNTER — Other Ambulatory Visit: Payer: Self-pay

## 2019-06-27 ENCOUNTER — Other Ambulatory Visit: Payer: PPO | Admitting: Student

## 2019-06-27 DIAGNOSIS — Z515 Encounter for palliative care: Secondary | ICD-10-CM | POA: Diagnosis not present

## 2019-06-27 NOTE — Progress Notes (Signed)
[facility] Consult Note Telephone: [phone]  Fax: [phone]  PATIENT NAME: [name] DOB: 03-18-1946 MRN: [id number]  PRIMARY CARE PROVIDER:   [name], MD  REFERRING PROVIDER:  [name], MD [facility] [place],  [place] [address]  RESPONSIBLE PARTY: Self    ASSESSMENT: Due to the COVID-19 crisis, this virtual check-in visit was done via telephone from my office and it was initiated and consent by this patient and or family. Visit held with patient and wife [name]. Son [name] was also on present on phone. Mr. Demetriou was unable to connect via zoom. We discussed patient's worsening dyspnea over the past week; he is encouraged to go to ER for evaluation. He states he has already contacted Dr. Laurance Flatten for an appointment and is awaiting a return call. NP called Dr. Tawanna Sat office and left message that patient had been advised to go to ER. Received return call from [place] and she states she had advised patient to go to [facility] where he could have someone with him as this was a concern for patient.        RECOMMENDATIONS and PLAN:  1. Worsening dyspnea-patient is recommended to go to ER for evaluation.   I spent 25 minutes providing this consultation,  from 2:00pm to 2:25pm. More than 50% of the time in this consultation was spent coordinating communication.   HISTORY OF PRESENT ILLNESS:  DAVELLE ANSELMI is a 73 y.o. male with multiple medical problems including chronic systolic heart failure,cardiomyopathy, myocardial infarction, hypertension, hypercholesterolemia, copd,diabetes,anemia, prostate cancer, depression,GERD, gout,macular degeneration, obstructive sleep apnea, on CPAP. He was recently hospitalized 6/19-6/25 with CAP, acute on chronic hypoxic respiratory failure, acute on chronic systolic heart failure. Palliative Care was asked to help address goals of care. Mr.  Trier reports having worsening shortness of breath in the past week, productive cough; denies fever. He states that he is having to turn his oxygen up to 5 liters if he walks or does anything with exertion.  He cannot ambulate more than 10 feet or bend over to pick up his dog without getting very short of breath. He states he had to use a wheel chair when he went to doctor on this past Monday and his son had to catch him from falling. He states his appetite has not been good. His weight this morning was 204 pounds. He reports having tremor. He states he has upcoming appointment with [facility].  CODE STATUS: Full Code   PPS: 40% HOSPICE ELIGIBILITY/DIAGNOSIS: TBD  PAST MEDICAL HISTORY:  Past Medical History:  Diagnosis Date  . Anemia   . Cancer ([place]) 12/2013   prostate  . Cardiogenic pulmonary edema ([place]) 12/19/2014  . Cardiomyopathy, ischemic   . CHF (congestive heart failure) ([place])   . COPD (chronic obstructive pulmonary disease) ([place])   . Depression   . Diabetes mellitus without complication ([place])   . GERD (gastroesophageal reflux disease)   . Gout   . Hypercholesteremia   . Hypertension   . Myocardial infarction ([place]) [id number]  . OSA on CPAP   . Shortness of breath dyspnea     SOCIAL HX:  Social History   Tobacco Use  . Smoking status: Former Smoker    Packs/day: 2.00    Years: 30.00    Pack years: 60.00    Quit date: 12/03/1996    Years since quitting: 22.5  . Smokeless tobacco: Never Used  Substance  Use Topics  . Alcohol use: No    Alcohol/week: 0.0 standard drinks    ALLERGIES:  Allergies  Allergen Reactions  . Brilinta [Ticagrelor] Shortness Of Breath  . Tuberculin Tests Rash  . Benadryl [Diphenhydramine] Other (See Comments)    " Hyperactivity"  . Doxycycline Swelling    Pt went into pulmonary edema.  . Lopid [Gemfibrozil] Swelling    "I gain 1 pound a day for 30 days."     PERTINENT MEDICATIONS:  Outpatient Encounter  Medications as of 06/27/2019  Medication Sig  . acetaminophen (TYLENOL) 500 MG tablet Take 1,000 mg by mouth daily as needed for moderate pain.  Marland Kitchen albuterol (PROVENTIL HFA;VENTOLIN HFA) 108 (90 Base) MCG/ACT inhaler Inhale 2 puffs into the lungs every 6 (six) hours as needed for wheezing or shortness of breath.  . allopurinol (ZYLOPRIM) 100 MG tablet Take 100 mg by mouth daily.  Marland Kitchen aspirin 81 MG chewable tablet Chew 1 tablet (81 mg total) by mouth daily.  . budesonide-formoterol (SYMBICORT) 80-4.5 MCG/ACT inhaler Inhale 2 puffs into the lungs 2 (two) times daily.  . calcium carbonate (OS-CAL) 600 MG tablet Take 600 mg by mouth 2 (two) times daily.  . calcium carbonate (TUMS - DOSED IN MG ELEMENTAL CALCIUM) 500 MG chewable tablet Chew 1 tablet by mouth as needed for indigestion or heartburn.  . carvedilol (COREG) 3.125 MG tablet Take 1 tablet (3.125 mg total) by mouth 2 (two) times daily with a meal.  . clopidogrel (PLAVIX) 75 MG tablet Take 75 mg by mouth daily.  Marland Kitchen CO-ENZYME Q10 PO Take 30 mg by mouth daily.   . ferrous sulfate 325 (65 FE) MG tablet Take 325 mg by mouth 2 (two) times daily with a meal.  . finasteride (PROSCAR) 5 MG tablet Take 5 mg by mouth daily.  Marland Kitchen FLUoxetine (PROZAC) 20 MG capsule Take 20 mg by mouth daily.  Marland Kitchen gabapentin (NEURONTIN) 300 MG capsule Take 300 mg by mouth 2 (two) times daily.   Marland Kitchen guaiFENesin (MUCINEX) 600 MG 12 hr tablet Take 600 mg by mouth 2 (two) times daily.  . insulin glargine (LANTUS) 100 UNIT/ML injection Inject 0.25 mLs (25 Units total) into the skin at bedtime.  . insulin lispro (HUMALOG) 100 UNIT/ML KiwkPen Inject 0-15 Units into the skin 3 (three) times daily with meals. About 25-30 units total for the day  . lansoprazole (PREVACID) 30 MG capsule Take 60 mg by mouth 2 (two) times daily.   Marland Kitchen loratadine (CLARITIN) 10 MG tablet Take 10 mg by mouth daily.  . magnesium oxide (MAG-OX) 400 (241.3 Mg) MG tablet TAKE ONE TABLET BY MOUTH TWICE A DAY  . Multiple  Vitamins-Minerals (CENTRUM SILVER PO) Take 1 tablet by mouth every morning.  . Multiple Vitamins-Minerals (PRESERVISION AREDS 2) CAPS Take 1 capsule by mouth daily. For macular degeneration- eye vitamins   . nitroGLYCERIN (NITROSTAT) 0.4 MG SL tablet Place 0.4 mg under the tongue every 5 (five) minutes as needed for chest pain.  . ranolazine (RANEXA) 500 MG 12 hr tablet Take 1 tablet (500 mg total) by mouth 2 (two) times daily.  . rosuvastatin (CRESTOR) 20 MG tablet Take 20 mg by mouth daily.  . tamsulosin (FLOMAX) 0.4 MG CAPS capsule Take 0.4 mg by mouth at bedtime.   Marland Kitchen tiotropium (SPIRIVA) 18 MCG inhalation capsule Place 1 capsule (18 mcg total) into inhaler and inhale daily.  Marland Kitchen torsemide (DEMADEX) 20 MG tablet Take 20 mg by mouth 2 (two) times daily.    No facility-administered  encounter medications on file as of 06/27/2019.     PHYSICAL EXAM:   Physical exam deferred.  Ezekiel Slocumb, NP

## 2019-06-28 DIAGNOSIS — J811 Chronic pulmonary edema: Secondary | ICD-10-CM | POA: Diagnosis not present

## 2019-06-28 DIAGNOSIS — I2510 Atherosclerotic heart disease of native coronary artery without angina pectoris: Secondary | ICD-10-CM | POA: Diagnosis not present

## 2019-06-28 DIAGNOSIS — J90 Pleural effusion, not elsewhere classified: Secondary | ICD-10-CM | POA: Diagnosis not present

## 2019-06-29 DIAGNOSIS — E1140 Type 2 diabetes mellitus with diabetic neuropathy, unspecified: Secondary | ICD-10-CM | POA: Diagnosis not present

## 2019-06-29 DIAGNOSIS — R0602 Shortness of breath: Secondary | ICD-10-CM | POA: Diagnosis not present

## 2019-06-29 DIAGNOSIS — Z9989 Dependence on other enabling machines and devices: Secondary | ICD-10-CM | POA: Diagnosis not present

## 2019-06-29 DIAGNOSIS — I5023 Acute on chronic systolic (congestive) heart failure: Secondary | ICD-10-CM | POA: Diagnosis not present

## 2019-06-29 DIAGNOSIS — R918 Other nonspecific abnormal finding of lung field: Secondary | ICD-10-CM | POA: Diagnosis not present

## 2019-06-29 DIAGNOSIS — Z951 Presence of aortocoronary bypass graft: Secondary | ICD-10-CM | POA: Diagnosis not present

## 2019-06-29 DIAGNOSIS — J811 Chronic pulmonary edema: Secondary | ICD-10-CM | POA: Diagnosis not present

## 2019-06-29 DIAGNOSIS — C61 Malignant neoplasm of prostate: Secondary | ICD-10-CM | POA: Diagnosis not present

## 2019-06-29 DIAGNOSIS — Z794 Long term (current) use of insulin: Secondary | ICD-10-CM | POA: Diagnosis not present

## 2019-06-29 DIAGNOSIS — G4733 Obstructive sleep apnea (adult) (pediatric): Secondary | ICD-10-CM | POA: Diagnosis not present

## 2019-06-29 DIAGNOSIS — K761 Chronic passive congestion of liver: Secondary | ICD-10-CM | POA: Diagnosis not present

## 2019-06-29 DIAGNOSIS — J90 Pleural effusion, not elsewhere classified: Secondary | ICD-10-CM | POA: Diagnosis not present

## 2019-06-29 DIAGNOSIS — I255 Ischemic cardiomyopathy: Secondary | ICD-10-CM | POA: Diagnosis not present

## 2019-06-29 DIAGNOSIS — I2510 Atherosclerotic heart disease of native coronary artery without angina pectoris: Secondary | ICD-10-CM | POA: Diagnosis not present

## 2019-06-29 DIAGNOSIS — Z77090 Contact with and (suspected) exposure to asbestos: Secondary | ICD-10-CM | POA: Diagnosis not present

## 2019-06-30 DIAGNOSIS — Z951 Presence of aortocoronary bypass graft: Secondary | ICD-10-CM | POA: Diagnosis not present

## 2019-06-30 DIAGNOSIS — I255 Ischemic cardiomyopathy: Secondary | ICD-10-CM | POA: Diagnosis not present

## 2019-06-30 DIAGNOSIS — I2510 Atherosclerotic heart disease of native coronary artery without angina pectoris: Secondary | ICD-10-CM | POA: Diagnosis not present

## 2019-06-30 DIAGNOSIS — E1140 Type 2 diabetes mellitus with diabetic neuropathy, unspecified: Secondary | ICD-10-CM | POA: Diagnosis not present

## 2019-06-30 DIAGNOSIS — Z794 Long term (current) use of insulin: Secondary | ICD-10-CM | POA: Diagnosis not present

## 2019-06-30 DIAGNOSIS — G4733 Obstructive sleep apnea (adult) (pediatric): Secondary | ICD-10-CM | POA: Diagnosis not present

## 2019-06-30 DIAGNOSIS — I5023 Acute on chronic systolic (congestive) heart failure: Secondary | ICD-10-CM | POA: Diagnosis not present

## 2019-06-30 DIAGNOSIS — C61 Malignant neoplasm of prostate: Secondary | ICD-10-CM | POA: Diagnosis not present

## 2019-06-30 DIAGNOSIS — Z9989 Dependence on other enabling machines and devices: Secondary | ICD-10-CM | POA: Diagnosis not present

## 2019-06-30 DIAGNOSIS — J90 Pleural effusion, not elsewhere classified: Secondary | ICD-10-CM | POA: Diagnosis not present

## 2019-06-30 DIAGNOSIS — R0602 Shortness of breath: Secondary | ICD-10-CM | POA: Diagnosis not present

## 2019-07-01 DIAGNOSIS — E1140 Type 2 diabetes mellitus with diabetic neuropathy, unspecified: Secondary | ICD-10-CM | POA: Diagnosis not present

## 2019-07-01 DIAGNOSIS — Z9989 Dependence on other enabling machines and devices: Secondary | ICD-10-CM | POA: Diagnosis not present

## 2019-07-01 DIAGNOSIS — I2510 Atherosclerotic heart disease of native coronary artery without angina pectoris: Secondary | ICD-10-CM | POA: Diagnosis not present

## 2019-07-01 DIAGNOSIS — Z794 Long term (current) use of insulin: Secondary | ICD-10-CM | POA: Diagnosis not present

## 2019-07-01 DIAGNOSIS — J90 Pleural effusion, not elsewhere classified: Secondary | ICD-10-CM | POA: Diagnosis not present

## 2019-07-01 DIAGNOSIS — Z951 Presence of aortocoronary bypass graft: Secondary | ICD-10-CM | POA: Diagnosis not present

## 2019-07-01 DIAGNOSIS — R0602 Shortness of breath: Secondary | ICD-10-CM | POA: Diagnosis not present

## 2019-07-01 DIAGNOSIS — I255 Ischemic cardiomyopathy: Secondary | ICD-10-CM | POA: Diagnosis not present

## 2019-07-01 DIAGNOSIS — I5023 Acute on chronic systolic (congestive) heart failure: Secondary | ICD-10-CM | POA: Diagnosis not present

## 2019-07-01 DIAGNOSIS — G4733 Obstructive sleep apnea (adult) (pediatric): Secondary | ICD-10-CM | POA: Diagnosis not present

## 2019-07-01 DIAGNOSIS — C61 Malignant neoplasm of prostate: Secondary | ICD-10-CM | POA: Diagnosis not present

## 2019-07-02 DIAGNOSIS — I110 Hypertensive heart disease with heart failure: Secondary | ICD-10-CM | POA: Diagnosis not present

## 2019-07-02 DIAGNOSIS — I255 Ischemic cardiomyopathy: Secondary | ICD-10-CM | POA: Diagnosis not present

## 2019-07-02 DIAGNOSIS — E785 Hyperlipidemia, unspecified: Secondary | ICD-10-CM | POA: Diagnosis not present

## 2019-07-02 DIAGNOSIS — E119 Type 2 diabetes mellitus without complications: Secondary | ICD-10-CM | POA: Diagnosis not present

## 2019-07-02 DIAGNOSIS — J90 Pleural effusion, not elsewhere classified: Secondary | ICD-10-CM | POA: Diagnosis not present

## 2019-07-02 DIAGNOSIS — I10 Essential (primary) hypertension: Secondary | ICD-10-CM | POA: Diagnosis not present

## 2019-07-02 DIAGNOSIS — I2510 Atherosclerotic heart disease of native coronary artery without angina pectoris: Secondary | ICD-10-CM | POA: Diagnosis not present

## 2019-07-02 DIAGNOSIS — Z951 Presence of aortocoronary bypass graft: Secondary | ICD-10-CM | POA: Diagnosis not present

## 2019-07-02 DIAGNOSIS — C61 Malignant neoplasm of prostate: Secondary | ICD-10-CM | POA: Diagnosis not present

## 2019-07-02 DIAGNOSIS — D638 Anemia in other chronic diseases classified elsewhere: Secondary | ICD-10-CM | POA: Diagnosis not present

## 2019-07-02 DIAGNOSIS — I5020 Unspecified systolic (congestive) heart failure: Secondary | ICD-10-CM | POA: Diagnosis not present

## 2019-07-02 DIAGNOSIS — Z87891 Personal history of nicotine dependence: Secondary | ICD-10-CM | POA: Diagnosis not present

## 2019-07-02 DIAGNOSIS — E1140 Type 2 diabetes mellitus with diabetic neuropathy, unspecified: Secondary | ICD-10-CM | POA: Diagnosis not present

## 2019-07-02 DIAGNOSIS — N189 Chronic kidney disease, unspecified: Secondary | ICD-10-CM | POA: Diagnosis not present

## 2019-07-02 DIAGNOSIS — R809 Proteinuria, unspecified: Secondary | ICD-10-CM | POA: Diagnosis not present

## 2019-07-02 DIAGNOSIS — Z95810 Presence of automatic (implantable) cardiac defibrillator: Secondary | ICD-10-CM | POA: Diagnosis not present

## 2019-07-02 DIAGNOSIS — K219 Gastro-esophageal reflux disease without esophagitis: Secondary | ICD-10-CM | POA: Diagnosis not present

## 2019-07-02 DIAGNOSIS — J811 Chronic pulmonary edema: Secondary | ICD-10-CM | POA: Diagnosis not present

## 2019-07-02 DIAGNOSIS — G4733 Obstructive sleep apnea (adult) (pediatric): Secondary | ICD-10-CM | POA: Diagnosis not present

## 2019-07-02 DIAGNOSIS — I5023 Acute on chronic systolic (congestive) heart failure: Secondary | ICD-10-CM | POA: Diagnosis not present

## 2019-07-03 DIAGNOSIS — D638 Anemia in other chronic diseases classified elsewhere: Secondary | ICD-10-CM | POA: Diagnosis not present

## 2019-07-03 DIAGNOSIS — I5020 Unspecified systolic (congestive) heart failure: Secondary | ICD-10-CM | POA: Diagnosis not present

## 2019-07-03 DIAGNOSIS — I2510 Atherosclerotic heart disease of native coronary artery without angina pectoris: Secondary | ICD-10-CM | POA: Diagnosis not present

## 2019-07-03 DIAGNOSIS — Z951 Presence of aortocoronary bypass graft: Secondary | ICD-10-CM | POA: Diagnosis not present

## 2019-07-03 DIAGNOSIS — G4733 Obstructive sleep apnea (adult) (pediatric): Secondary | ICD-10-CM | POA: Diagnosis not present

## 2019-07-03 DIAGNOSIS — I10 Essential (primary) hypertension: Secondary | ICD-10-CM | POA: Diagnosis not present

## 2019-07-03 DIAGNOSIS — I5023 Acute on chronic systolic (congestive) heart failure: Secondary | ICD-10-CM | POA: Diagnosis not present

## 2019-07-03 DIAGNOSIS — I255 Ischemic cardiomyopathy: Secondary | ICD-10-CM | POA: Diagnosis not present

## 2019-07-03 DIAGNOSIS — J90 Pleural effusion, not elsewhere classified: Secondary | ICD-10-CM | POA: Diagnosis not present

## 2019-07-03 DIAGNOSIS — E1140 Type 2 diabetes mellitus with diabetic neuropathy, unspecified: Secondary | ICD-10-CM | POA: Diagnosis not present

## 2019-07-03 DIAGNOSIS — C61 Malignant neoplasm of prostate: Secondary | ICD-10-CM | POA: Diagnosis not present

## 2019-07-03 DIAGNOSIS — K219 Gastro-esophageal reflux disease without esophagitis: Secondary | ICD-10-CM | POA: Diagnosis not present

## 2019-07-04 DIAGNOSIS — D638 Anemia in other chronic diseases classified elsewhere: Secondary | ICD-10-CM | POA: Diagnosis not present

## 2019-07-04 DIAGNOSIS — I255 Ischemic cardiomyopathy: Secondary | ICD-10-CM | POA: Diagnosis not present

## 2019-07-04 DIAGNOSIS — R634 Abnormal weight loss: Secondary | ICD-10-CM | POA: Diagnosis not present

## 2019-07-04 DIAGNOSIS — Z951 Presence of aortocoronary bypass graft: Secondary | ICD-10-CM | POA: Diagnosis not present

## 2019-07-04 DIAGNOSIS — I2510 Atherosclerotic heart disease of native coronary artery without angina pectoris: Secondary | ICD-10-CM | POA: Diagnosis not present

## 2019-07-04 DIAGNOSIS — I5023 Acute on chronic systolic (congestive) heart failure: Secondary | ICD-10-CM | POA: Diagnosis not present

## 2019-07-04 DIAGNOSIS — I10 Essential (primary) hypertension: Secondary | ICD-10-CM | POA: Diagnosis not present

## 2019-07-04 DIAGNOSIS — J90 Pleural effusion, not elsewhere classified: Secondary | ICD-10-CM | POA: Diagnosis not present

## 2019-07-04 DIAGNOSIS — K219 Gastro-esophageal reflux disease without esophagitis: Secondary | ICD-10-CM | POA: Diagnosis not present

## 2019-07-04 DIAGNOSIS — C61 Malignant neoplasm of prostate: Secondary | ICD-10-CM | POA: Diagnosis not present

## 2019-07-04 DIAGNOSIS — E1140 Type 2 diabetes mellitus with diabetic neuropathy, unspecified: Secondary | ICD-10-CM | POA: Diagnosis not present

## 2019-07-04 DIAGNOSIS — G4733 Obstructive sleep apnea (adult) (pediatric): Secondary | ICD-10-CM | POA: Diagnosis not present

## 2019-07-05 DIAGNOSIS — C61 Malignant neoplasm of prostate: Secondary | ICD-10-CM | POA: Diagnosis not present

## 2019-07-05 DIAGNOSIS — G4733 Obstructive sleep apnea (adult) (pediatric): Secondary | ICD-10-CM | POA: Diagnosis not present

## 2019-07-05 DIAGNOSIS — Z452 Encounter for adjustment and management of vascular access device: Secondary | ICD-10-CM | POA: Diagnosis not present

## 2019-07-05 DIAGNOSIS — I10 Essential (primary) hypertension: Secondary | ICD-10-CM | POA: Diagnosis not present

## 2019-07-05 DIAGNOSIS — E1140 Type 2 diabetes mellitus with diabetic neuropathy, unspecified: Secondary | ICD-10-CM | POA: Diagnosis not present

## 2019-07-05 DIAGNOSIS — J90 Pleural effusion, not elsewhere classified: Secondary | ICD-10-CM | POA: Diagnosis not present

## 2019-07-05 DIAGNOSIS — D638 Anemia in other chronic diseases classified elsewhere: Secondary | ICD-10-CM | POA: Diagnosis not present

## 2019-07-05 DIAGNOSIS — I255 Ischemic cardiomyopathy: Secondary | ICD-10-CM | POA: Diagnosis not present

## 2019-07-05 DIAGNOSIS — Z9989 Dependence on other enabling machines and devices: Secondary | ICD-10-CM | POA: Diagnosis not present

## 2019-07-05 DIAGNOSIS — R634 Abnormal weight loss: Secondary | ICD-10-CM | POA: Diagnosis not present

## 2019-07-05 DIAGNOSIS — Z951 Presence of aortocoronary bypass graft: Secondary | ICD-10-CM | POA: Diagnosis not present

## 2019-07-05 DIAGNOSIS — I2510 Atherosclerotic heart disease of native coronary artery without angina pectoris: Secondary | ICD-10-CM | POA: Diagnosis not present

## 2019-07-05 DIAGNOSIS — J811 Chronic pulmonary edema: Secondary | ICD-10-CM | POA: Diagnosis not present

## 2019-07-05 DIAGNOSIS — I5023 Acute on chronic systolic (congestive) heart failure: Secondary | ICD-10-CM | POA: Diagnosis not present

## 2019-07-05 DIAGNOSIS — Z794 Long term (current) use of insulin: Secondary | ICD-10-CM | POA: Diagnosis not present

## 2019-07-05 DIAGNOSIS — R570 Cardiogenic shock: Secondary | ICD-10-CM | POA: Diagnosis not present

## 2019-07-06 DIAGNOSIS — I2510 Atherosclerotic heart disease of native coronary artery without angina pectoris: Secondary | ICD-10-CM | POA: Diagnosis not present

## 2019-07-06 DIAGNOSIS — Z4509 Encounter for adjustment and management of other cardiac device: Secondary | ICD-10-CM | POA: Diagnosis not present

## 2019-07-06 DIAGNOSIS — Z9989 Dependence on other enabling machines and devices: Secondary | ICD-10-CM | POA: Diagnosis not present

## 2019-07-06 DIAGNOSIS — Z951 Presence of aortocoronary bypass graft: Secondary | ICD-10-CM | POA: Diagnosis not present

## 2019-07-06 DIAGNOSIS — J811 Chronic pulmonary edema: Secondary | ICD-10-CM | POA: Diagnosis not present

## 2019-07-06 DIAGNOSIS — I255 Ischemic cardiomyopathy: Secondary | ICD-10-CM | POA: Diagnosis not present

## 2019-07-06 DIAGNOSIS — G4733 Obstructive sleep apnea (adult) (pediatric): Secondary | ICD-10-CM | POA: Diagnosis not present

## 2019-07-06 DIAGNOSIS — E1140 Type 2 diabetes mellitus with diabetic neuropathy, unspecified: Secondary | ICD-10-CM | POA: Diagnosis not present

## 2019-07-06 DIAGNOSIS — Z9889 Other specified postprocedural states: Secondary | ICD-10-CM | POA: Diagnosis not present

## 2019-07-06 DIAGNOSIS — J90 Pleural effusion, not elsewhere classified: Secondary | ICD-10-CM | POA: Diagnosis not present

## 2019-07-06 DIAGNOSIS — I5023 Acute on chronic systolic (congestive) heart failure: Secondary | ICD-10-CM | POA: Diagnosis not present

## 2019-07-06 DIAGNOSIS — R570 Cardiogenic shock: Secondary | ICD-10-CM | POA: Diagnosis not present

## 2019-07-06 DIAGNOSIS — J9811 Atelectasis: Secondary | ICD-10-CM | POA: Diagnosis not present

## 2019-07-06 DIAGNOSIS — Z79899 Other long term (current) drug therapy: Secondary | ICD-10-CM | POA: Diagnosis not present

## 2019-07-06 DIAGNOSIS — D638 Anemia in other chronic diseases classified elsewhere: Secondary | ICD-10-CM | POA: Diagnosis not present

## 2019-07-07 DIAGNOSIS — J811 Chronic pulmonary edema: Secondary | ICD-10-CM | POA: Diagnosis not present

## 2019-07-07 DIAGNOSIS — Z951 Presence of aortocoronary bypass graft: Secondary | ICD-10-CM | POA: Diagnosis not present

## 2019-07-07 DIAGNOSIS — I5023 Acute on chronic systolic (congestive) heart failure: Secondary | ICD-10-CM | POA: Diagnosis not present

## 2019-07-07 DIAGNOSIS — I2510 Atherosclerotic heart disease of native coronary artery without angina pectoris: Secondary | ICD-10-CM | POA: Diagnosis not present

## 2019-07-07 DIAGNOSIS — N179 Acute kidney failure, unspecified: Secondary | ICD-10-CM | POA: Diagnosis not present

## 2019-07-07 DIAGNOSIS — R918 Other nonspecific abnormal finding of lung field: Secondary | ICD-10-CM | POA: Diagnosis not present

## 2019-07-07 DIAGNOSIS — I5020 Unspecified systolic (congestive) heart failure: Secondary | ICD-10-CM | POA: Diagnosis not present

## 2019-07-07 DIAGNOSIS — N400 Enlarged prostate without lower urinary tract symptoms: Secondary | ICD-10-CM | POA: Diagnosis not present

## 2019-07-07 DIAGNOSIS — G4733 Obstructive sleep apnea (adult) (pediatric): Secondary | ICD-10-CM | POA: Diagnosis not present

## 2019-07-07 DIAGNOSIS — I130 Hypertensive heart and chronic kidney disease with heart failure and stage 1 through stage 4 chronic kidney disease, or unspecified chronic kidney disease: Secondary | ICD-10-CM | POA: Diagnosis not present

## 2019-07-07 DIAGNOSIS — J449 Chronic obstructive pulmonary disease, unspecified: Secondary | ICD-10-CM | POA: Diagnosis not present

## 2019-07-07 DIAGNOSIS — I255 Ischemic cardiomyopathy: Secondary | ICD-10-CM | POA: Diagnosis not present

## 2019-07-07 DIAGNOSIS — K219 Gastro-esophageal reflux disease without esophagitis: Secondary | ICD-10-CM | POA: Diagnosis not present

## 2019-07-07 DIAGNOSIS — J9811 Atelectasis: Secondary | ICD-10-CM | POA: Diagnosis not present

## 2019-07-07 DIAGNOSIS — N189 Chronic kidney disease, unspecified: Secondary | ICD-10-CM | POA: Diagnosis not present

## 2019-07-07 DIAGNOSIS — J90 Pleural effusion, not elsewhere classified: Secondary | ICD-10-CM | POA: Diagnosis not present

## 2019-07-07 DIAGNOSIS — D638 Anemia in other chronic diseases classified elsewhere: Secondary | ICD-10-CM | POA: Diagnosis not present

## 2019-07-07 DIAGNOSIS — R570 Cardiogenic shock: Secondary | ICD-10-CM | POA: Diagnosis not present

## 2019-07-07 DIAGNOSIS — F329 Major depressive disorder, single episode, unspecified: Secondary | ICD-10-CM | POA: Diagnosis not present

## 2019-07-08 DIAGNOSIS — J811 Chronic pulmonary edema: Secondary | ICD-10-CM | POA: Diagnosis not present

## 2019-07-08 DIAGNOSIS — I130 Hypertensive heart and chronic kidney disease with heart failure and stage 1 through stage 4 chronic kidney disease, or unspecified chronic kidney disease: Secondary | ICD-10-CM | POA: Diagnosis not present

## 2019-07-08 DIAGNOSIS — N179 Acute kidney failure, unspecified: Secondary | ICD-10-CM | POA: Diagnosis not present

## 2019-07-08 DIAGNOSIS — I5020 Unspecified systolic (congestive) heart failure: Secondary | ICD-10-CM | POA: Diagnosis not present

## 2019-07-08 DIAGNOSIS — G4733 Obstructive sleep apnea (adult) (pediatric): Secondary | ICD-10-CM | POA: Diagnosis not present

## 2019-07-08 DIAGNOSIS — F329 Major depressive disorder, single episode, unspecified: Secondary | ICD-10-CM | POA: Diagnosis not present

## 2019-07-08 DIAGNOSIS — J9811 Atelectasis: Secondary | ICD-10-CM | POA: Diagnosis not present

## 2019-07-08 DIAGNOSIS — J90 Pleural effusion, not elsewhere classified: Secondary | ICD-10-CM | POA: Diagnosis not present

## 2019-07-08 DIAGNOSIS — I255 Ischemic cardiomyopathy: Secondary | ICD-10-CM | POA: Diagnosis not present

## 2019-07-08 DIAGNOSIS — N189 Chronic kidney disease, unspecified: Secondary | ICD-10-CM | POA: Diagnosis not present

## 2019-07-08 DIAGNOSIS — D638 Anemia in other chronic diseases classified elsewhere: Secondary | ICD-10-CM | POA: Diagnosis not present

## 2019-07-08 DIAGNOSIS — I2510 Atherosclerotic heart disease of native coronary artery without angina pectoris: Secondary | ICD-10-CM | POA: Diagnosis not present

## 2019-07-08 DIAGNOSIS — K219 Gastro-esophageal reflux disease without esophagitis: Secondary | ICD-10-CM | POA: Diagnosis not present

## 2019-07-08 DIAGNOSIS — I5023 Acute on chronic systolic (congestive) heart failure: Secondary | ICD-10-CM | POA: Diagnosis not present

## 2019-07-08 DIAGNOSIS — J449 Chronic obstructive pulmonary disease, unspecified: Secondary | ICD-10-CM | POA: Diagnosis not present

## 2019-07-08 DIAGNOSIS — R570 Cardiogenic shock: Secondary | ICD-10-CM | POA: Diagnosis not present

## 2019-07-08 DIAGNOSIS — Z951 Presence of aortocoronary bypass graft: Secondary | ICD-10-CM | POA: Diagnosis not present

## 2019-07-08 DIAGNOSIS — N400 Enlarged prostate without lower urinary tract symptoms: Secondary | ICD-10-CM | POA: Diagnosis not present

## 2019-07-09 DIAGNOSIS — Z9889 Other specified postprocedural states: Secondary | ICD-10-CM | POA: Diagnosis not present

## 2019-07-09 DIAGNOSIS — I110 Hypertensive heart disease with heart failure: Secondary | ICD-10-CM | POA: Diagnosis not present

## 2019-07-09 DIAGNOSIS — I509 Heart failure, unspecified: Secondary | ICD-10-CM | POA: Diagnosis not present

## 2019-07-09 DIAGNOSIS — Z955 Presence of coronary angioplasty implant and graft: Secondary | ICD-10-CM | POA: Diagnosis not present

## 2019-07-09 DIAGNOSIS — R570 Cardiogenic shock: Secondary | ICD-10-CM | POA: Diagnosis not present

## 2019-07-09 DIAGNOSIS — I255 Ischemic cardiomyopathy: Secondary | ICD-10-CM | POA: Diagnosis not present

## 2019-07-09 DIAGNOSIS — Z951 Presence of aortocoronary bypass graft: Secondary | ICD-10-CM | POA: Diagnosis not present

## 2019-07-09 DIAGNOSIS — Z95811 Presence of heart assist device: Secondary | ICD-10-CM | POA: Diagnosis not present

## 2019-07-09 DIAGNOSIS — E119 Type 2 diabetes mellitus without complications: Secondary | ICD-10-CM | POA: Diagnosis not present

## 2019-07-09 DIAGNOSIS — Z95810 Presence of automatic (implantable) cardiac defibrillator: Secondary | ICD-10-CM | POA: Diagnosis not present

## 2019-07-09 DIAGNOSIS — I5023 Acute on chronic systolic (congestive) heart failure: Secondary | ICD-10-CM | POA: Diagnosis not present

## 2019-07-09 DIAGNOSIS — J90 Pleural effusion, not elsewhere classified: Secondary | ICD-10-CM | POA: Diagnosis not present

## 2019-07-09 DIAGNOSIS — G4730 Sleep apnea, unspecified: Secondary | ICD-10-CM | POA: Diagnosis not present

## 2019-07-09 DIAGNOSIS — J811 Chronic pulmonary edema: Secondary | ICD-10-CM | POA: Diagnosis not present

## 2019-07-09 DIAGNOSIS — I2510 Atherosclerotic heart disease of native coronary artery without angina pectoris: Secondary | ICD-10-CM | POA: Diagnosis not present

## 2019-07-09 DIAGNOSIS — J984 Other disorders of lung: Secondary | ICD-10-CM | POA: Diagnosis not present

## 2019-07-10 DIAGNOSIS — I2510 Atherosclerotic heart disease of native coronary artery without angina pectoris: Secondary | ICD-10-CM | POA: Diagnosis not present

## 2019-07-10 DIAGNOSIS — Z951 Presence of aortocoronary bypass graft: Secondary | ICD-10-CM | POA: Diagnosis not present

## 2019-07-10 DIAGNOSIS — D62 Acute posthemorrhagic anemia: Secondary | ICD-10-CM | POA: Diagnosis not present

## 2019-07-10 DIAGNOSIS — J95821 Acute postprocedural respiratory failure: Secondary | ICD-10-CM | POA: Diagnosis not present

## 2019-07-10 DIAGNOSIS — I998 Other disorder of circulatory system: Secondary | ICD-10-CM | POA: Diagnosis not present

## 2019-07-10 DIAGNOSIS — Z95811 Presence of heart assist device: Secondary | ICD-10-CM | POA: Diagnosis not present

## 2019-07-10 DIAGNOSIS — I519 Heart disease, unspecified: Secondary | ICD-10-CM | POA: Diagnosis not present

## 2019-07-10 DIAGNOSIS — I10 Essential (primary) hypertension: Secondary | ICD-10-CM | POA: Diagnosis not present

## 2019-07-10 DIAGNOSIS — R918 Other nonspecific abnormal finding of lung field: Secondary | ICD-10-CM | POA: Diagnosis not present

## 2019-07-10 DIAGNOSIS — J90 Pleural effusion, not elsewhere classified: Secondary | ICD-10-CM | POA: Diagnosis not present

## 2019-07-10 DIAGNOSIS — G8918 Other acute postprocedural pain: Secondary | ICD-10-CM | POA: Diagnosis not present

## 2019-07-10 DIAGNOSIS — D72829 Elevated white blood cell count, unspecified: Secondary | ICD-10-CM | POA: Diagnosis not present

## 2019-07-10 IMAGING — CR DG CHEST 2V
2 series · 2 of 2 positions shown · non-contrast
Comparison: Radiograph 09/02/2018, additional priors

CLINICAL DATA: Shortness of breath.  History of pulmonary edema.

EXAM:
CHEST - 2 VIEW

[chest pa]
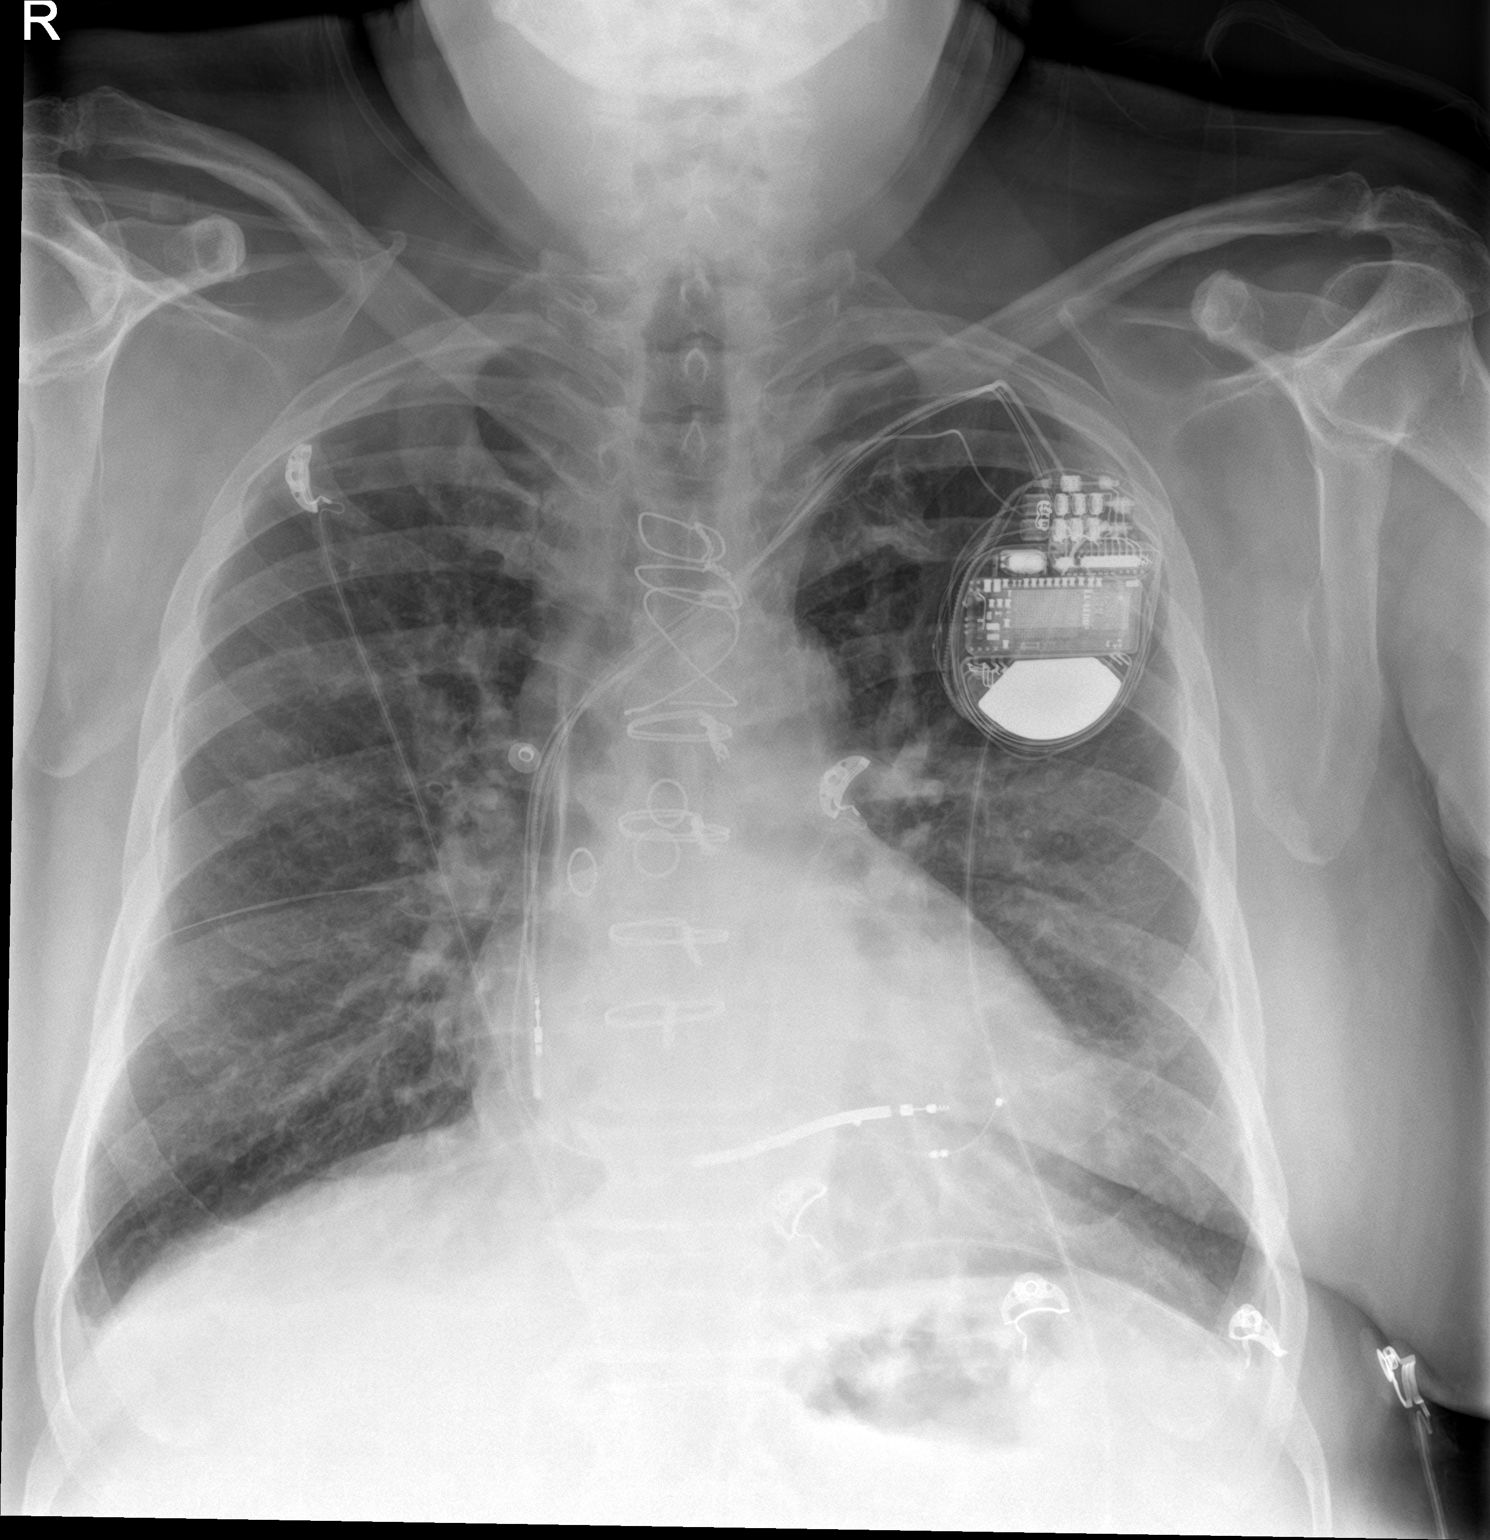

[chest lat]
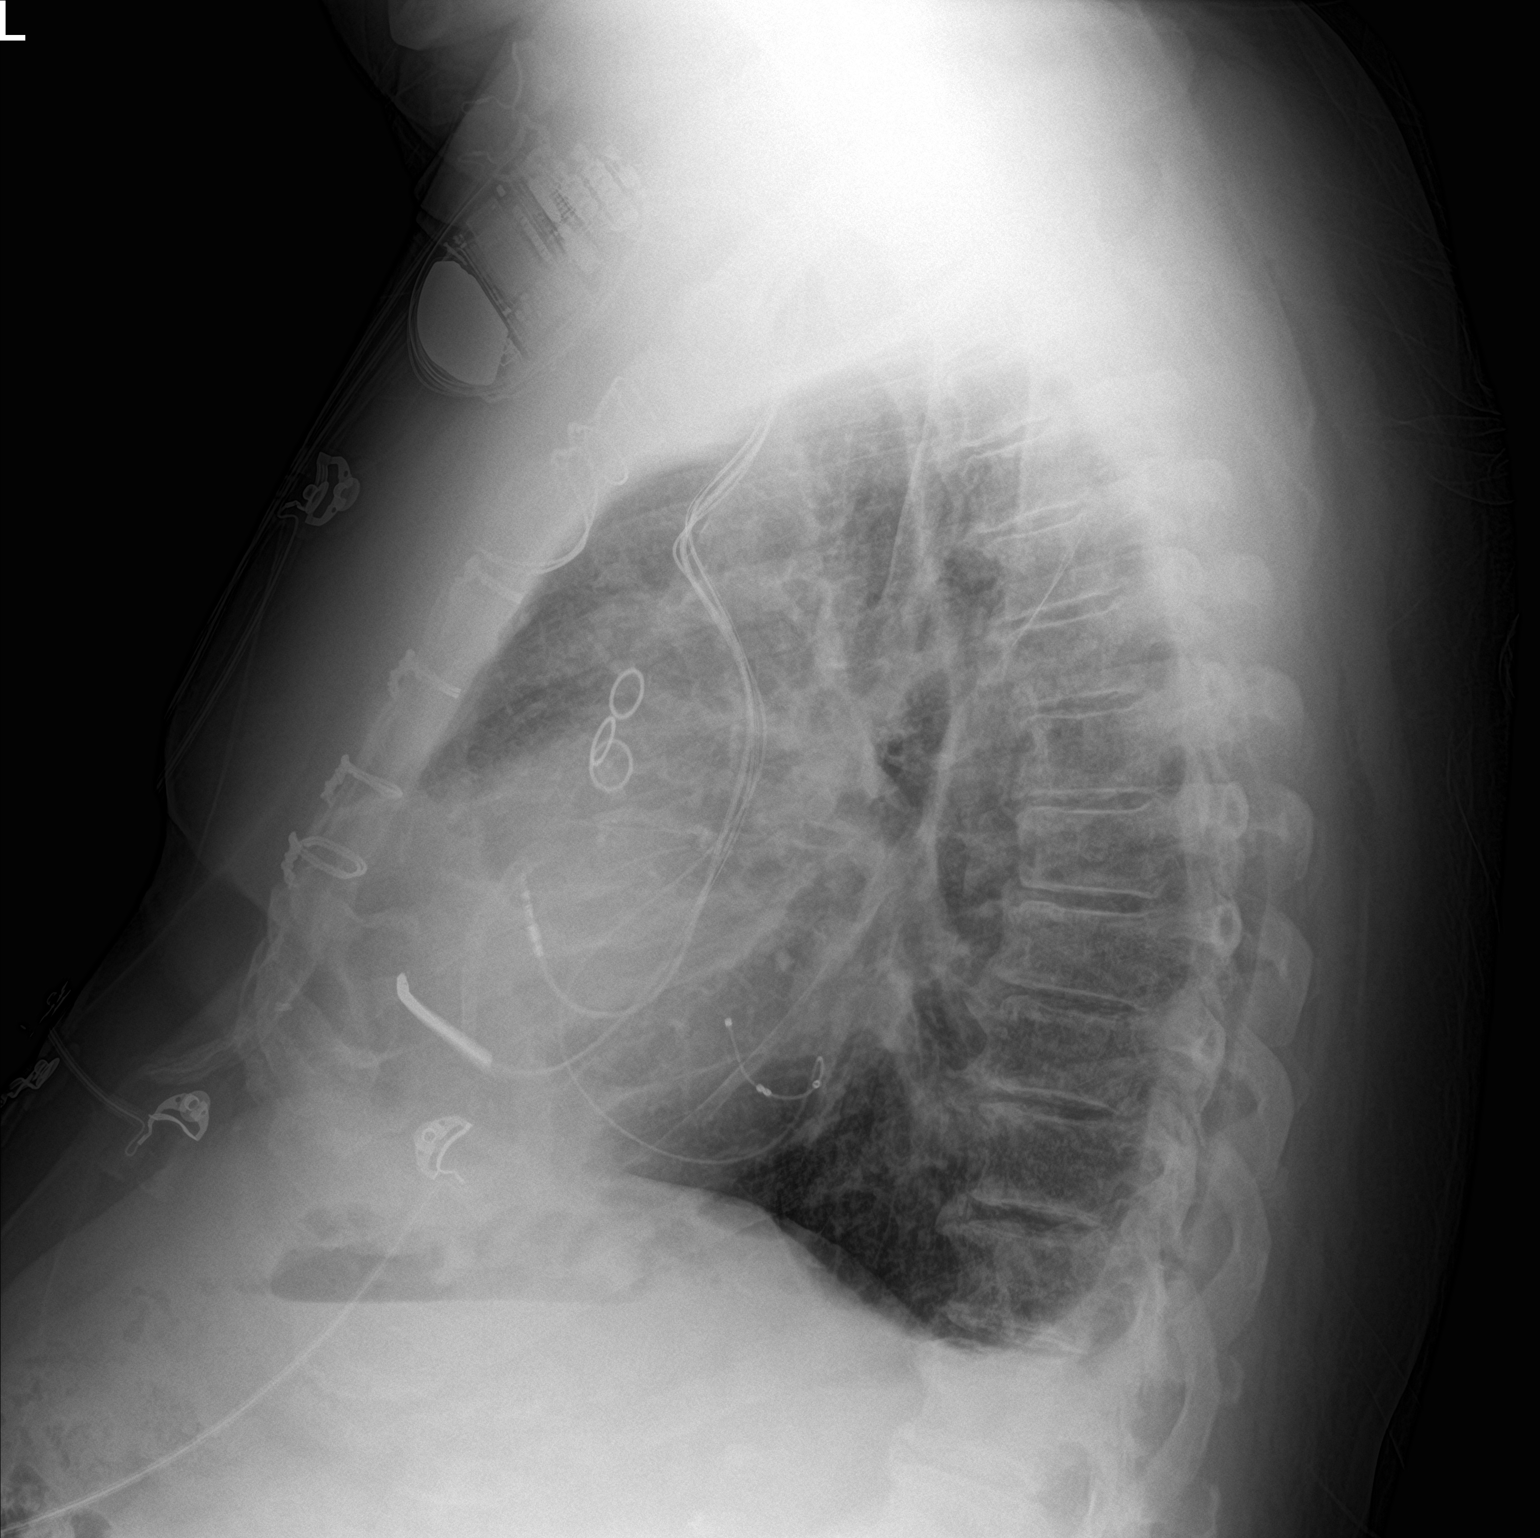

[2 of 2 positions shown; findings below may reference images not displayed]

FINDINGS: Multi lead left-sided pacemaker in place, intact leads. Post median
sternotomy. Unchanged cardiomegaly. Coronary stent or
calcifications. Improvement in pulmonary edema with mild central
residual. Small residual pleural effusions, also diminished. No
focal airspace disease or pneumothorax. Degenerative change in the
spine.
IMPRESSION: 1. Improving pulmonary edema and small pleural effusions, mild
residual.
2. Unchanged cardiomegaly.

## 2019-07-11 DIAGNOSIS — I2510 Atherosclerotic heart disease of native coronary artery without angina pectoris: Secondary | ICD-10-CM | POA: Diagnosis not present

## 2019-07-11 DIAGNOSIS — Z95810 Presence of automatic (implantable) cardiac defibrillator: Secondary | ICD-10-CM | POA: Diagnosis not present

## 2019-07-11 DIAGNOSIS — G8918 Other acute postprocedural pain: Secondary | ICD-10-CM | POA: Diagnosis not present

## 2019-07-11 DIAGNOSIS — D72829 Elevated white blood cell count, unspecified: Secondary | ICD-10-CM | POA: Diagnosis not present

## 2019-07-11 DIAGNOSIS — I4901 Ventricular fibrillation: Secondary | ICD-10-CM | POA: Diagnosis not present

## 2019-07-11 DIAGNOSIS — I10 Essential (primary) hypertension: Secondary | ICD-10-CM | POA: Diagnosis not present

## 2019-07-11 DIAGNOSIS — Z95811 Presence of heart assist device: Secondary | ICD-10-CM | POA: Diagnosis not present

## 2019-07-11 DIAGNOSIS — J90 Pleural effusion, not elsewhere classified: Secondary | ICD-10-CM | POA: Diagnosis not present

## 2019-07-11 DIAGNOSIS — D62 Acute posthemorrhagic anemia: Secondary | ICD-10-CM | POA: Diagnosis not present

## 2019-07-11 DIAGNOSIS — I472 Ventricular tachycardia: Secondary | ICD-10-CM | POA: Diagnosis not present

## 2019-07-11 DIAGNOSIS — J95821 Acute postprocedural respiratory failure: Secondary | ICD-10-CM | POA: Diagnosis not present

## 2019-07-11 DIAGNOSIS — I5023 Acute on chronic systolic (congestive) heart failure: Secondary | ICD-10-CM | POA: Diagnosis not present

## 2019-07-11 DIAGNOSIS — I255 Ischemic cardiomyopathy: Secondary | ICD-10-CM | POA: Diagnosis not present

## 2019-07-11 DIAGNOSIS — I998 Other disorder of circulatory system: Secondary | ICD-10-CM | POA: Diagnosis not present

## 2019-07-11 DIAGNOSIS — R918 Other nonspecific abnormal finding of lung field: Secondary | ICD-10-CM | POA: Diagnosis not present

## 2019-07-11 DIAGNOSIS — I519 Heart disease, unspecified: Secondary | ICD-10-CM | POA: Diagnosis not present

## 2019-07-11 DIAGNOSIS — K598 Other specified functional intestinal disorders: Secondary | ICD-10-CM | POA: Diagnosis not present

## 2019-07-12 DIAGNOSIS — Z9911 Dependence on respirator [ventilator] status: Secondary | ICD-10-CM | POA: Diagnosis not present

## 2019-07-12 DIAGNOSIS — N179 Acute kidney failure, unspecified: Secondary | ICD-10-CM | POA: Diagnosis not present

## 2019-07-12 DIAGNOSIS — I255 Ischemic cardiomyopathy: Secondary | ICD-10-CM | POA: Diagnosis not present

## 2019-07-12 DIAGNOSIS — I519 Heart disease, unspecified: Secondary | ICD-10-CM | POA: Diagnosis not present

## 2019-07-12 DIAGNOSIS — Z95811 Presence of heart assist device: Secondary | ICD-10-CM | POA: Diagnosis not present

## 2019-07-12 DIAGNOSIS — Z9889 Other specified postprocedural states: Secondary | ICD-10-CM | POA: Diagnosis not present

## 2019-07-12 DIAGNOSIS — E1140 Type 2 diabetes mellitus with diabetic neuropathy, unspecified: Secondary | ICD-10-CM | POA: Diagnosis not present

## 2019-07-12 DIAGNOSIS — Z794 Long term (current) use of insulin: Secondary | ICD-10-CM | POA: Diagnosis not present

## 2019-07-12 DIAGNOSIS — I2510 Atherosclerotic heart disease of native coronary artery without angina pectoris: Secondary | ICD-10-CM | POA: Diagnosis not present

## 2019-07-12 DIAGNOSIS — J9811 Atelectasis: Secondary | ICD-10-CM | POA: Diagnosis not present

## 2019-07-12 DIAGNOSIS — I5022 Chronic systolic (congestive) heart failure: Secondary | ICD-10-CM | POA: Diagnosis not present

## 2019-07-12 DIAGNOSIS — J90 Pleural effusion, not elsewhere classified: Secondary | ICD-10-CM | POA: Diagnosis not present

## 2019-07-12 DIAGNOSIS — I472 Ventricular tachycardia: Secondary | ICD-10-CM | POA: Diagnosis not present

## 2019-07-13 DIAGNOSIS — I5023 Acute on chronic systolic (congestive) heart failure: Secondary | ICD-10-CM | POA: Diagnosis not present

## 2019-07-13 DIAGNOSIS — J811 Chronic pulmonary edema: Secondary | ICD-10-CM | POA: Diagnosis not present

## 2019-07-13 DIAGNOSIS — J90 Pleural effusion, not elsewhere classified: Secondary | ICD-10-CM | POA: Diagnosis not present

## 2019-07-13 DIAGNOSIS — Z95811 Presence of heart assist device: Secondary | ICD-10-CM | POA: Diagnosis not present

## 2019-07-13 DIAGNOSIS — I2510 Atherosclerotic heart disease of native coronary artery without angina pectoris: Secondary | ICD-10-CM | POA: Diagnosis not present

## 2019-07-13 DIAGNOSIS — I255 Ischemic cardiomyopathy: Secondary | ICD-10-CM | POA: Diagnosis not present

## 2019-07-13 DIAGNOSIS — R918 Other nonspecific abnormal finding of lung field: Secondary | ICD-10-CM | POA: Diagnosis not present

## 2019-07-14 DIAGNOSIS — J811 Chronic pulmonary edema: Secondary | ICD-10-CM | POA: Diagnosis not present

## 2019-07-14 DIAGNOSIS — I2510 Atherosclerotic heart disease of native coronary artery without angina pectoris: Secondary | ICD-10-CM | POA: Diagnosis not present

## 2019-07-14 DIAGNOSIS — J9811 Atelectasis: Secondary | ICD-10-CM | POA: Diagnosis not present

## 2019-07-15 DIAGNOSIS — Z95811 Presence of heart assist device: Secondary | ICD-10-CM | POA: Diagnosis not present

## 2019-07-15 DIAGNOSIS — J90 Pleural effusion, not elsewhere classified: Secondary | ICD-10-CM | POA: Diagnosis not present

## 2019-07-15 DIAGNOSIS — I10 Essential (primary) hypertension: Secondary | ICD-10-CM | POA: Diagnosis not present

## 2019-07-16 DIAGNOSIS — Z95811 Presence of heart assist device: Secondary | ICD-10-CM | POA: Diagnosis not present

## 2019-07-16 DIAGNOSIS — I255 Ischemic cardiomyopathy: Secondary | ICD-10-CM | POA: Diagnosis not present

## 2019-07-16 DIAGNOSIS — J90 Pleural effusion, not elsewhere classified: Secondary | ICD-10-CM | POA: Diagnosis not present

## 2019-07-16 DIAGNOSIS — I2510 Atherosclerotic heart disease of native coronary artery without angina pectoris: Secondary | ICD-10-CM | POA: Diagnosis not present

## 2019-07-16 DIAGNOSIS — I10 Essential (primary) hypertension: Secondary | ICD-10-CM | POA: Diagnosis not present

## 2019-07-16 DIAGNOSIS — I5023 Acute on chronic systolic (congestive) heart failure: Secondary | ICD-10-CM | POA: Diagnosis not present

## 2019-07-17 DIAGNOSIS — I2510 Atherosclerotic heart disease of native coronary artery without angina pectoris: Secondary | ICD-10-CM | POA: Diagnosis not present

## 2019-07-17 DIAGNOSIS — I4729 Other ventricular tachycardia: Secondary | ICD-10-CM | POA: Insufficient documentation

## 2019-07-17 DIAGNOSIS — Z95811 Presence of heart assist device: Secondary | ICD-10-CM | POA: Insufficient documentation

## 2019-07-17 DIAGNOSIS — I5023 Acute on chronic systolic (congestive) heart failure: Secondary | ICD-10-CM | POA: Diagnosis not present

## 2019-07-17 DIAGNOSIS — I255 Ischemic cardiomyopathy: Secondary | ICD-10-CM | POA: Diagnosis not present

## 2019-07-18 DIAGNOSIS — R918 Other nonspecific abnormal finding of lung field: Secondary | ICD-10-CM | POA: Diagnosis not present

## 2019-07-18 DIAGNOSIS — J90 Pleural effusion, not elsewhere classified: Secondary | ICD-10-CM | POA: Diagnosis not present

## 2019-07-18 DIAGNOSIS — I255 Ischemic cardiomyopathy: Secondary | ICD-10-CM | POA: Diagnosis not present

## 2019-07-18 DIAGNOSIS — I2510 Atherosclerotic heart disease of native coronary artery without angina pectoris: Secondary | ICD-10-CM | POA: Diagnosis not present

## 2019-07-18 DIAGNOSIS — Z794 Long term (current) use of insulin: Secondary | ICD-10-CM | POA: Diagnosis not present

## 2019-07-18 DIAGNOSIS — Z95811 Presence of heart assist device: Secondary | ICD-10-CM | POA: Diagnosis not present

## 2019-07-18 DIAGNOSIS — I5023 Acute on chronic systolic (congestive) heart failure: Secondary | ICD-10-CM | POA: Diagnosis not present

## 2019-07-18 DIAGNOSIS — E1140 Type 2 diabetes mellitus with diabetic neuropathy, unspecified: Secondary | ICD-10-CM | POA: Diagnosis not present

## 2019-07-18 DIAGNOSIS — Z9889 Other specified postprocedural states: Secondary | ICD-10-CM | POA: Diagnosis not present

## 2019-07-19 DIAGNOSIS — J90 Pleural effusion, not elsewhere classified: Secondary | ICD-10-CM | POA: Diagnosis not present

## 2019-07-19 DIAGNOSIS — Z95811 Presence of heart assist device: Secondary | ICD-10-CM | POA: Diagnosis not present

## 2019-07-19 DIAGNOSIS — Z794 Long term (current) use of insulin: Secondary | ICD-10-CM | POA: Diagnosis not present

## 2019-07-19 DIAGNOSIS — I519 Heart disease, unspecified: Secondary | ICD-10-CM | POA: Insufficient documentation

## 2019-07-19 DIAGNOSIS — I5023 Acute on chronic systolic (congestive) heart failure: Secondary | ICD-10-CM | POA: Diagnosis not present

## 2019-07-19 DIAGNOSIS — I2510 Atherosclerotic heart disease of native coronary artery without angina pectoris: Secondary | ICD-10-CM | POA: Diagnosis not present

## 2019-07-19 DIAGNOSIS — E1140 Type 2 diabetes mellitus with diabetic neuropathy, unspecified: Secondary | ICD-10-CM | POA: Diagnosis not present

## 2019-07-19 DIAGNOSIS — I255 Ischemic cardiomyopathy: Secondary | ICD-10-CM | POA: Diagnosis not present

## 2019-07-20 DIAGNOSIS — E1140 Type 2 diabetes mellitus with diabetic neuropathy, unspecified: Secondary | ICD-10-CM | POA: Diagnosis not present

## 2019-07-20 DIAGNOSIS — Z951 Presence of aortocoronary bypass graft: Secondary | ICD-10-CM | POA: Diagnosis not present

## 2019-07-20 DIAGNOSIS — I255 Ischemic cardiomyopathy: Secondary | ICD-10-CM | POA: Diagnosis not present

## 2019-07-20 DIAGNOSIS — Z95811 Presence of heart assist device: Secondary | ICD-10-CM | POA: Diagnosis not present

## 2019-07-20 DIAGNOSIS — I5023 Acute on chronic systolic (congestive) heart failure: Secondary | ICD-10-CM | POA: Diagnosis not present

## 2019-07-20 DIAGNOSIS — Z794 Long term (current) use of insulin: Secondary | ICD-10-CM | POA: Diagnosis not present

## 2019-07-20 DIAGNOSIS — I2510 Atherosclerotic heart disease of native coronary artery without angina pectoris: Secondary | ICD-10-CM | POA: Diagnosis not present

## 2019-07-21 DIAGNOSIS — I2510 Atherosclerotic heart disease of native coronary artery without angina pectoris: Secondary | ICD-10-CM | POA: Diagnosis not present

## 2019-07-21 DIAGNOSIS — E1140 Type 2 diabetes mellitus with diabetic neuropathy, unspecified: Secondary | ICD-10-CM | POA: Diagnosis not present

## 2019-07-21 DIAGNOSIS — Z794 Long term (current) use of insulin: Secondary | ICD-10-CM | POA: Diagnosis not present

## 2019-07-21 DIAGNOSIS — I255 Ischemic cardiomyopathy: Secondary | ICD-10-CM | POA: Diagnosis not present

## 2019-07-22 DIAGNOSIS — I2510 Atherosclerotic heart disease of native coronary artery without angina pectoris: Secondary | ICD-10-CM | POA: Diagnosis not present

## 2019-07-22 DIAGNOSIS — I5023 Acute on chronic systolic (congestive) heart failure: Secondary | ICD-10-CM | POA: Diagnosis not present

## 2019-07-22 DIAGNOSIS — Z9989 Dependence on other enabling machines and devices: Secondary | ICD-10-CM | POA: Diagnosis not present

## 2019-07-22 DIAGNOSIS — G4733 Obstructive sleep apnea (adult) (pediatric): Secondary | ICD-10-CM | POA: Diagnosis not present

## 2019-07-22 DIAGNOSIS — I255 Ischemic cardiomyopathy: Secondary | ICD-10-CM | POA: Diagnosis not present

## 2019-07-25 DIAGNOSIS — I2510 Atherosclerotic heart disease of native coronary artery without angina pectoris: Secondary | ICD-10-CM | POA: Diagnosis not present

## 2019-07-25 DIAGNOSIS — Z95811 Presence of heart assist device: Secondary | ICD-10-CM | POA: Diagnosis not present

## 2019-07-25 DIAGNOSIS — Z7901 Long term (current) use of anticoagulants: Secondary | ICD-10-CM | POA: Insufficient documentation

## 2019-07-25 DIAGNOSIS — I5023 Acute on chronic systolic (congestive) heart failure: Secondary | ICD-10-CM | POA: Diagnosis not present

## 2019-07-26 ENCOUNTER — Telehealth: Payer: Self-pay | Admitting: Adult Health Nurse Practitioner

## 2019-07-26 NOTE — Telephone Encounter (Signed)
Spoke with patient's son [name] about scheduling a Palliative f/u visit and he stated that patient was not discharged home yesterday from [facility] as planned.  He said that he is now  possibly going to [facility].  I told son that I would call him back next week to f/u with him and he was in agreement with this.

## 2019-07-28 DIAGNOSIS — I5022 Chronic systolic (congestive) heart failure: Secondary | ICD-10-CM | POA: Diagnosis not present

## 2019-07-28 DIAGNOSIS — Z95811 Presence of heart assist device: Secondary | ICD-10-CM | POA: Diagnosis not present

## 2019-07-30 DIAGNOSIS — Z95811 Presence of heart assist device: Secondary | ICD-10-CM | POA: Diagnosis not present

## 2019-07-30 DIAGNOSIS — I5022 Chronic systolic (congestive) heart failure: Secondary | ICD-10-CM | POA: Diagnosis not present

## 2019-08-12 DIAGNOSIS — I5022 Chronic systolic (congestive) heart failure: Secondary | ICD-10-CM | POA: Diagnosis not present

## 2019-08-15 DIAGNOSIS — J984 Other disorders of lung: Secondary | ICD-10-CM | POA: Insufficient documentation

## 2019-08-20 DIAGNOSIS — K922 Gastrointestinal hemorrhage, unspecified: Secondary | ICD-10-CM | POA: Diagnosis not present

## 2019-08-20 DIAGNOSIS — D500 Iron deficiency anemia secondary to blood loss (chronic): Secondary | ICD-10-CM | POA: Diagnosis not present

## 2019-08-20 DIAGNOSIS — K921 Melena: Secondary | ICD-10-CM | POA: Diagnosis not present

## 2019-08-20 DIAGNOSIS — D62 Acute posthemorrhagic anemia: Secondary | ICD-10-CM | POA: Diagnosis not present

## 2019-08-21 DIAGNOSIS — K921 Melena: Secondary | ICD-10-CM | POA: Diagnosis not present

## 2019-08-21 DIAGNOSIS — D500 Iron deficiency anemia secondary to blood loss (chronic): Secondary | ICD-10-CM | POA: Diagnosis not present

## 2019-08-21 DIAGNOSIS — K922 Gastrointestinal hemorrhage, unspecified: Secondary | ICD-10-CM | POA: Diagnosis not present

## 2019-08-21 DIAGNOSIS — D62 Acute posthemorrhagic anemia: Secondary | ICD-10-CM | POA: Diagnosis not present

## 2019-08-23 DIAGNOSIS — D62 Acute posthemorrhagic anemia: Secondary | ICD-10-CM | POA: Diagnosis not present

## 2019-08-23 DIAGNOSIS — K921 Melena: Secondary | ICD-10-CM | POA: Diagnosis not present

## 2019-08-23 DIAGNOSIS — K922 Gastrointestinal hemorrhage, unspecified: Secondary | ICD-10-CM | POA: Diagnosis not present

## 2019-08-23 DIAGNOSIS — D500 Iron deficiency anemia secondary to blood loss (chronic): Secondary | ICD-10-CM | POA: Diagnosis not present

## 2019-08-28 DIAGNOSIS — K922 Gastrointestinal hemorrhage, unspecified: Secondary | ICD-10-CM | POA: Diagnosis not present

## 2019-08-28 DIAGNOSIS — I471 Supraventricular tachycardia: Secondary | ICD-10-CM | POA: Diagnosis not present

## 2019-08-28 DIAGNOSIS — L89152 Pressure ulcer of sacral region, stage 2: Secondary | ICD-10-CM | POA: Diagnosis not present

## 2019-08-28 DIAGNOSIS — E1140 Type 2 diabetes mellitus with diabetic neuropathy, unspecified: Secondary | ICD-10-CM | POA: Diagnosis not present

## 2019-08-28 DIAGNOSIS — R0902 Hypoxemia: Secondary | ICD-10-CM | POA: Diagnosis not present

## 2019-08-28 DIAGNOSIS — I50810 Right heart failure, unspecified: Secondary | ICD-10-CM | POA: Diagnosis not present

## 2019-08-28 DIAGNOSIS — D500 Iron deficiency anemia secondary to blood loss (chronic): Secondary | ICD-10-CM | POA: Diagnosis not present

## 2019-08-28 DIAGNOSIS — S71102A Unspecified open wound, left thigh, initial encounter: Secondary | ICD-10-CM | POA: Diagnosis not present

## 2019-08-28 DIAGNOSIS — I5022 Chronic systolic (congestive) heart failure: Secondary | ICD-10-CM | POA: Diagnosis not present

## 2019-08-28 DIAGNOSIS — I255 Ischemic cardiomyopathy: Secondary | ICD-10-CM | POA: Diagnosis not present

## 2019-08-30 DIAGNOSIS — S71102A Unspecified open wound, left thigh, initial encounter: Secondary | ICD-10-CM | POA: Diagnosis not present

## 2019-08-30 DIAGNOSIS — I5022 Chronic systolic (congestive) heart failure: Secondary | ICD-10-CM | POA: Diagnosis not present

## 2019-08-30 DIAGNOSIS — I471 Supraventricular tachycardia: Secondary | ICD-10-CM | POA: Diagnosis not present

## 2019-08-30 DIAGNOSIS — I255 Ischemic cardiomyopathy: Secondary | ICD-10-CM | POA: Diagnosis not present

## 2019-08-30 DIAGNOSIS — E1140 Type 2 diabetes mellitus with diabetic neuropathy, unspecified: Secondary | ICD-10-CM | POA: Diagnosis not present

## 2019-08-30 DIAGNOSIS — D500 Iron deficiency anemia secondary to blood loss (chronic): Secondary | ICD-10-CM | POA: Diagnosis not present

## 2019-08-30 DIAGNOSIS — R0902 Hypoxemia: Secondary | ICD-10-CM | POA: Diagnosis not present

## 2019-08-30 DIAGNOSIS — I50810 Right heart failure, unspecified: Secondary | ICD-10-CM | POA: Diagnosis not present

## 2019-08-30 DIAGNOSIS — K922 Gastrointestinal hemorrhage, unspecified: Secondary | ICD-10-CM | POA: Diagnosis not present

## 2019-08-30 DIAGNOSIS — L89152 Pressure ulcer of sacral region, stage 2: Secondary | ICD-10-CM | POA: Diagnosis not present

## 2019-09-05 ENCOUNTER — Telehealth: Payer: Self-pay

## 2019-09-05 DIAGNOSIS — I50810 Right heart failure, unspecified: Secondary | ICD-10-CM | POA: Diagnosis not present

## 2019-09-05 DIAGNOSIS — K922 Gastrointestinal hemorrhage, unspecified: Secondary | ICD-10-CM | POA: Diagnosis not present

## 2019-09-05 DIAGNOSIS — I5022 Chronic systolic (congestive) heart failure: Secondary | ICD-10-CM | POA: Diagnosis not present

## 2019-09-05 DIAGNOSIS — S71102A Unspecified open wound, left thigh, initial encounter: Secondary | ICD-10-CM | POA: Diagnosis not present

## 2019-09-05 DIAGNOSIS — I471 Supraventricular tachycardia: Secondary | ICD-10-CM | POA: Diagnosis not present

## 2019-09-05 DIAGNOSIS — L89152 Pressure ulcer of sacral region, stage 2: Secondary | ICD-10-CM | POA: Diagnosis not present

## 2019-09-05 DIAGNOSIS — D500 Iron deficiency anemia secondary to blood loss (chronic): Secondary | ICD-10-CM | POA: Diagnosis not present

## 2019-09-05 DIAGNOSIS — E1140 Type 2 diabetes mellitus with diabetic neuropathy, unspecified: Secondary | ICD-10-CM | POA: Diagnosis not present

## 2019-09-05 DIAGNOSIS — I255 Ischemic cardiomyopathy: Secondary | ICD-10-CM | POA: Diagnosis not present

## 2019-09-05 DIAGNOSIS — R0902 Hypoxemia: Secondary | ICD-10-CM | POA: Diagnosis not present

## 2019-09-05 NOTE — Telephone Encounter (Signed)
Phone call placed to patient to check in and to offer to schedule visit with Palliative Care. Patient receptive to scheduling follow up visit. Scheduled for 09/06/2019 @ 11 am. Covid screen negative

## 2019-09-06 ENCOUNTER — Other Ambulatory Visit: Payer: Self-pay

## 2019-09-06 ENCOUNTER — Other Ambulatory Visit: Payer: PPO | Admitting: Adult Health Nurse Practitioner

## 2019-09-06 DIAGNOSIS — Z515 Encounter for palliative care: Secondary | ICD-10-CM

## 2019-09-06 NOTE — Progress Notes (Signed)
[facility] Consult Note Telephone: [phone]  Fax: [phone]  PATIENT NAME: [name] DOB: November 11, 1946 MRN: [id number]  PRIMARY CARE PROVIDER:   [name], MD  REFERRING PROVIDER:  [name], MD [facility] [place],  [place] [address]  RESPONSIBLE PARTY:   Self [phone]    RECOMMENDATIONS and PLAN:  1.  Advanced care planning.  Patient wishes to be a full code.  Starting going over MOST form.  Did express that he would want everything done that could be done, but does not want to be left on anything life prolonging if he was going to be a "vegetable."  Left MOST form with family and have appointment in one month to go over with patient and family.  2.  Heart failure.  Patient just got home from the hospital on 08/26/2019.  He states that he was in the hospital at [facility] for 60 days.  He originally went in with left ventricular failure.  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  Reports that over 2L of fluid was taken off of his right lung.  States that he was at [facility] rehab just 36 hours from coming home when he had a GI bleed due to the increased pressure on the blood vessels from the LVAD.  This was corrected over a colonoscopy and 4 endoscopies.  Patient is currently receiving home health services of PT/OT/RN through [facility].  RN is taking care of pressure wound to his bottom and incision from groin from angioplasty.  He states that they are improving.  He states that he is getting stronger and is walking longer distances with his walker.  Sometimes will walk short distance without his walker.  Continue home health services as ordered and continue follow up with cardiology  3. Support.  Patient has family support and assistance with his wife, sons, and granddaughter-in-law.  His granddaughter-in-law took the training on the LVAD and helps him with it.    Overall patient is doing well after his  lengthy hospitalization and is progressing well with home health services.  Have follow up visit in a month to further discuss ACP  I spent 60 minutes providing this consultation,  from 11:00 to 12:00. More than 50% of the time in this consultation was spent coordinating communication.   HISTORY OF PRESENT ILLNESS:  [name] is a 73 y.o. year old male with multiple medical problems including chronic systolic heart failure,cardiomyopathy, myocardial infarction, hypertension, hypercholesterolemia, copd,diabetes,anemia, prostate cancer, depression,GERD, gout,macular degeneration, obstructive sleep apnea, on CPAP. Palliative Care was asked to help address goals of care.   CODE STATUS: full code  PPS: 60% HOSPICE ELIGIBILITY/DIAGNOSIS: TBD  PHYSICAL EXAM:   General: Patient sitting in chair in NAD Cardiovascular: Can hear the LVAD Pulmonary: lung sounds diminished but clear; normal respiratory effort Extremities: no edema, no joint deformities Skin: no rashes Neurological: Weakness but otherwise nonfocal  PAST MEDICAL HISTORY:  Past Medical History:  Diagnosis Date  . Anemia   . Cancer ([place]) 12/2013   prostate  . Cardiogenic pulmonary edema ([place]) 12/19/2014  . Cardiomyopathy, ischemic   . CHF (congestive heart failure) ([place])   . COPD (chronic obstructive pulmonary disease) ([place])   . Depression   . Diabetes mellitus without complication ([place])   . GERD (gastroesophageal reflux disease)   . Gout   . Hypercholesteremia   . Hypertension   . Myocardial infarction ([place]) [id number]  . OSA on CPAP   .  Shortness of breath dyspnea     SOCIAL HX:  Social History   Tobacco Use  . Smoking status: Former Smoker    Packs/day: 2.00    Years: 30.00    Pack years: 60.00    Quit date: 12/03/1996    Years since quitting: 22.7  . Smokeless tobacco: Never Used  Substance Use Topics  . Alcohol use: No    Alcohol/week: 0.0 standard drinks    ALLERGIES:  Allergies  Allergen  Reactions  . Brilinta [Ticagrelor] Shortness Of Breath  . Tuberculin Tests Rash  . Benadryl [Diphenhydramine] Other (See Comments)    " Hyperactivity"  . Doxycycline Swelling    Pt went into pulmonary edema.  . Lopid [Gemfibrozil] Swelling    "I gain 1 pound a day for 30 days."     PERTINENT MEDICATIONS:  Outpatient Encounter Medications as of 09/06/2019  Medication Sig  . acetaminophen (TYLENOL) 500 MG tablet Take 1,000 mg by mouth daily as needed for moderate pain.  Marland Kitchen albuterol (PROVENTIL HFA;VENTOLIN HFA) 108 (90 Base) MCG/ACT inhaler Inhale 2 puffs into the lungs every 6 (six) hours as needed for wheezing or shortness of breath.  . allopurinol (ZYLOPRIM) 100 MG tablet Take 100 mg by mouth daily.  Marland Kitchen aspirin 81 MG chewable tablet Chew 1 tablet (81 mg total) by mouth daily.  . budesonide-formoterol (SYMBICORT) 80-4.5 MCG/ACT inhaler Inhale 2 puffs into the lungs 2 (two) times daily.  . calcium carbonate (OS-CAL) 600 MG tablet Take 600 mg by mouth 2 (two) times daily.  . calcium carbonate (TUMS - DOSED IN MG ELEMENTAL CALCIUM) 500 MG chewable tablet Chew 1 tablet by mouth as needed for indigestion or heartburn.  . carvedilol (COREG) 3.125 MG tablet Take 1 tablet (3.125 mg total) by mouth 2 (two) times daily with a meal.  . clopidogrel (PLAVIX) 75 MG tablet Take 75 mg by mouth daily.  Marland Kitchen CO-ENZYME Q10 PO Take 30 mg by mouth daily.   . ferrous sulfate 325 (65 FE) MG tablet Take 325 mg by mouth 2 (two) times daily with a meal.  . finasteride (PROSCAR) 5 MG tablet Take 5 mg by mouth daily.  Marland Kitchen FLUoxetine (PROZAC) 20 MG capsule Take 20 mg by mouth daily.  Marland Kitchen gabapentin (NEURONTIN) 300 MG capsule Take 300 mg by mouth 2 (two) times daily.   Marland Kitchen guaiFENesin (MUCINEX) 600 MG 12 hr tablet Take 600 mg by mouth 2 (two) times daily.  . insulin glargine (LANTUS) 100 UNIT/ML injection Inject 0.25 mLs (25 Units total) into the skin at bedtime.  . insulin lispro (HUMALOG) 100 UNIT/ML KiwkPen Inject 0-15 Units  into the skin 3 (three) times daily with meals. About 25-30 units total for the day  . lansoprazole (PREVACID) 30 MG capsule Take 60 mg by mouth 2 (two) times daily.   Marland Kitchen loratadine (CLARITIN) 10 MG tablet Take 10 mg by mouth daily.  . magnesium oxide (MAG-OX) 400 (241.3 Mg) MG tablet TAKE ONE TABLET BY MOUTH TWICE A DAY  . Multiple Vitamins-Minerals (CENTRUM SILVER PO) Take 1 tablet by mouth every morning.  . Multiple Vitamins-Minerals (PRESERVISION AREDS 2) CAPS Take 1 capsule by mouth daily. For macular degeneration- eye vitamins   . nitroGLYCERIN (NITROSTAT) 0.4 MG SL tablet Place 0.4 mg under the tongue every 5 (five) minutes as needed for chest pain.  . ranolazine (RANEXA) 500 MG 12 hr tablet Take 1 tablet (500 mg total) by mouth 2 (two) times daily.  . rosuvastatin (CRESTOR) 20 MG tablet Take 20  mg by mouth daily.  . tamsulosin (FLOMAX) 0.4 MG CAPS capsule Take 0.4 mg by mouth at bedtime.   Marland Kitchen tiotropium (SPIRIVA) 18 MCG inhalation capsule Place 1 capsule (18 mcg total) into inhaler and inhale daily.  Marland Kitchen torsemide (DEMADEX) 20 MG tablet Take 20 mg by mouth 2 (two) times daily.    No facility-administered encounter medications on file as of 09/06/2019.       Treylen Gibbs Jenetta Downer, NP

## 2019-09-07 DIAGNOSIS — I5022 Chronic systolic (congestive) heart failure: Secondary | ICD-10-CM | POA: Diagnosis not present

## 2019-09-07 DIAGNOSIS — I255 Ischemic cardiomyopathy: Secondary | ICD-10-CM | POA: Diagnosis not present

## 2019-09-07 DIAGNOSIS — L89152 Pressure ulcer of sacral region, stage 2: Secondary | ICD-10-CM | POA: Diagnosis not present

## 2019-09-07 DIAGNOSIS — R0902 Hypoxemia: Secondary | ICD-10-CM | POA: Diagnosis not present

## 2019-09-07 DIAGNOSIS — D500 Iron deficiency anemia secondary to blood loss (chronic): Secondary | ICD-10-CM | POA: Diagnosis not present

## 2019-09-07 DIAGNOSIS — E1140 Type 2 diabetes mellitus with diabetic neuropathy, unspecified: Secondary | ICD-10-CM | POA: Diagnosis not present

## 2019-09-07 DIAGNOSIS — S71102A Unspecified open wound, left thigh, initial encounter: Secondary | ICD-10-CM | POA: Diagnosis not present

## 2019-09-07 DIAGNOSIS — I50810 Right heart failure, unspecified: Secondary | ICD-10-CM | POA: Diagnosis not present

## 2019-09-07 DIAGNOSIS — K922 Gastrointestinal hemorrhage, unspecified: Secondary | ICD-10-CM | POA: Diagnosis not present

## 2019-09-07 DIAGNOSIS — I471 Supraventricular tachycardia: Secondary | ICD-10-CM | POA: Diagnosis not present

## 2019-09-10 DIAGNOSIS — I471 Supraventricular tachycardia: Secondary | ICD-10-CM | POA: Diagnosis not present

## 2019-09-10 DIAGNOSIS — E1140 Type 2 diabetes mellitus with diabetic neuropathy, unspecified: Secondary | ICD-10-CM | POA: Diagnosis not present

## 2019-09-10 DIAGNOSIS — I255 Ischemic cardiomyopathy: Secondary | ICD-10-CM | POA: Diagnosis not present

## 2019-09-10 DIAGNOSIS — D500 Iron deficiency anemia secondary to blood loss (chronic): Secondary | ICD-10-CM | POA: Diagnosis not present

## 2019-09-10 DIAGNOSIS — I50810 Right heart failure, unspecified: Secondary | ICD-10-CM | POA: Diagnosis not present

## 2019-09-10 DIAGNOSIS — L89152 Pressure ulcer of sacral region, stage 2: Secondary | ICD-10-CM | POA: Diagnosis not present

## 2019-09-10 DIAGNOSIS — R0902 Hypoxemia: Secondary | ICD-10-CM | POA: Diagnosis not present

## 2019-09-10 DIAGNOSIS — I5022 Chronic systolic (congestive) heart failure: Secondary | ICD-10-CM | POA: Diagnosis not present

## 2019-09-10 DIAGNOSIS — S71102A Unspecified open wound, left thigh, initial encounter: Secondary | ICD-10-CM | POA: Diagnosis not present

## 2019-09-10 DIAGNOSIS — K922 Gastrointestinal hemorrhage, unspecified: Secondary | ICD-10-CM | POA: Diagnosis not present

## 2019-09-11 DIAGNOSIS — I5022 Chronic systolic (congestive) heart failure: Secondary | ICD-10-CM | POA: Diagnosis not present

## 2019-09-14 DIAGNOSIS — I471 Supraventricular tachycardia: Secondary | ICD-10-CM | POA: Diagnosis not present

## 2019-09-14 DIAGNOSIS — I255 Ischemic cardiomyopathy: Secondary | ICD-10-CM | POA: Diagnosis not present

## 2019-09-14 DIAGNOSIS — L89152 Pressure ulcer of sacral region, stage 2: Secondary | ICD-10-CM | POA: Diagnosis not present

## 2019-09-14 DIAGNOSIS — K922 Gastrointestinal hemorrhage, unspecified: Secondary | ICD-10-CM | POA: Diagnosis not present

## 2019-09-14 DIAGNOSIS — I5022 Chronic systolic (congestive) heart failure: Secondary | ICD-10-CM | POA: Diagnosis not present

## 2019-09-14 DIAGNOSIS — R0902 Hypoxemia: Secondary | ICD-10-CM | POA: Diagnosis not present

## 2019-09-14 DIAGNOSIS — D500 Iron deficiency anemia secondary to blood loss (chronic): Secondary | ICD-10-CM | POA: Diagnosis not present

## 2019-09-14 DIAGNOSIS — S71102A Unspecified open wound, left thigh, initial encounter: Secondary | ICD-10-CM | POA: Diagnosis not present

## 2019-09-14 DIAGNOSIS — E1140 Type 2 diabetes mellitus with diabetic neuropathy, unspecified: Secondary | ICD-10-CM | POA: Diagnosis not present

## 2019-09-14 DIAGNOSIS — I50810 Right heart failure, unspecified: Secondary | ICD-10-CM | POA: Diagnosis not present

## 2019-09-26 ENCOUNTER — Telehealth: Payer: Self-pay | Admitting: Adult Health Nurse Practitioner

## 2019-09-26 NOTE — Telephone Encounter (Signed)
Called to change in person visit on 10/26 to telehealth.  Left VM with reason for call and contact info. Kamaile Zachow K. Olena Heckle NP

## 2019-10-01 ENCOUNTER — Other Ambulatory Visit: Payer: Self-pay

## 2019-10-01 ENCOUNTER — Other Ambulatory Visit: Payer: PPO | Admitting: Adult Health Nurse Practitioner

## 2019-10-01 DIAGNOSIS — Z515 Encounter for palliative care: Secondary | ICD-10-CM

## 2019-10-01 NOTE — Progress Notes (Signed)
[facility] Consult Note Telephone: [phone]  Fax: [phone]  PATIENT NAME: [name] DOB: May 21, 1946 MRN: [id number]  PRIMARY CARE PROVIDER:   [name], MD  REFERRING PROVIDER:  [name], MD [place] [facility] [place],  [place] [address]  RESPONSIBLE PARTY:   Self [phone]     RECOMMENDATIONS and PLAN:  1.  Advanced care planning.  Patient is a full code.  Went over MOST form today but did not fill it out.  Family wants to talk with LVAD doctor to see what options are for CPR.  Attempted to answer questions.  Set appointment in 6 weeks to go over further.    2.  Heart failure.  Patient has LVAD and has not had any problems with it.  Have expressed that he is supposed to have an appointment with his LVAD doctor monthly.  Has not had one since September 22 or 23.  Have encouraged him/family to call and get appointment set up for routine monitoring.  Denies edema, increased cough or SOB,  Chest pain.  3.  Support.  Patient has lots of family support and family is involved in LVAD training and help him with monitoring his device.    Overall patient is doing well with no new concerns.  Denies falls or any recent infections.  Is able to get around the house without a walker at times.  Has been enjoying outings to farmers market with family  I spent 60 minutes providing this consultation,  from 1:00 to 2:00. More than 50% of the time in this consultation was spent coordinating communication.   HISTORY OF PRESENT ILLNESS:  [name] is a 73 y.o. year old male with multiple medical problems including chronic systolic heart failure,cardiomyopathy, myocardial infarction, hypertension, hypercholesterolemia, copd,diabetes,anemia, prostate cancer, depression,GERD, gout,macular degeneration, obstructive sleep apnea, on CPAP. Palliative Care was asked to help address goals of care.    CODE STATUS: Full code  PPS: 60% HOSPICE ELIGIBILITY/DIAGNOSIS: TBD  PHYSICAL EXAM:   General: patient sitting in chair in NAD Extremities: no edema, no joint deformities Neurological: Weakness but otherwise nonfocal    PAST MEDICAL HISTORY:  Past Medical History:  Diagnosis Date  . Anemia   . Cancer ([place]) 12/2013   prostate  . Cardiogenic pulmonary edema ([place]) 12/19/2014  . Cardiomyopathy, ischemic   . CHF (congestive heart failure) ([place])   . COPD (chronic obstructive pulmonary disease) ([place])   . Depression   . Diabetes mellitus without complication ([place])   . GERD (gastroesophageal reflux disease)   . Gout   . Hypercholesteremia   . Hypertension   . Myocardial infarction ([place]) [id number]  . OSA on CPAP   . Shortness of breath dyspnea     SOCIAL HX:  Social History   Tobacco Use  . Smoking status: Former Smoker    Packs/day: 2.00    Years: 30.00    Pack years: 60.00    Quit date: 12/03/1996    Years since quitting: 22.8  . Smokeless tobacco: Never Used  Substance Use Topics  . Alcohol use: No    Alcohol/week: 0.0 standard drinks    ALLERGIES:  Allergies  Allergen Reactions  . Brilinta [Ticagrelor] Shortness Of Breath  . Tuberculin Tests Rash  . Benadryl [Diphenhydramine] Other (See Comments)    " Hyperactivity"  . Doxycycline Swelling    Pt went into pulmonary edema.  . Lopid [Gemfibrozil] Swelling    "I gain 1 pound  a day for 30 days."     PERTINENT MEDICATIONS:  Outpatient Encounter Medications as of 10/01/2019  Medication Sig  . acetaminophen (TYLENOL) 500 MG tablet Take 1,000 mg by mouth daily as needed for moderate pain.  Marland Kitchen albuterol (PROVENTIL HFA;VENTOLIN HFA) 108 (90 Base) MCG/ACT inhaler Inhale 2 puffs into the lungs every 6 (six) hours as needed for wheezing or shortness of breath.  . allopurinol (ZYLOPRIM) 100 MG tablet Take 100 mg by mouth daily.  Marland Kitchen aspirin 81 MG chewable tablet Chew 1 tablet (81 mg total) by mouth daily.  .  budesonide-formoterol (SYMBICORT) 80-4.5 MCG/ACT inhaler Inhale 2 puffs into the lungs 2 (two) times daily.  . calcium carbonate (OS-CAL) 600 MG tablet Take 600 mg by mouth 2 (two) times daily.  . calcium carbonate (TUMS - DOSED IN MG ELEMENTAL CALCIUM) 500 MG chewable tablet Chew 1 tablet by mouth as needed for indigestion or heartburn.  . carvedilol (COREG) 3.125 MG tablet Take 1 tablet (3.125 mg total) by mouth 2 (two) times daily with a meal.  . clopidogrel (PLAVIX) 75 MG tablet Take 75 mg by mouth daily.  Marland Kitchen CO-ENZYME Q10 PO Take 30 mg by mouth daily.   . ferrous sulfate 325 (65 FE) MG tablet Take 325 mg by mouth 2 (two) times daily with a meal.  . finasteride (PROSCAR) 5 MG tablet Take 5 mg by mouth daily.  Marland Kitchen FLUoxetine (PROZAC) 20 MG capsule Take 20 mg by mouth daily.  Marland Kitchen gabapentin (NEURONTIN) 300 MG capsule Take 300 mg by mouth 2 (two) times daily.   Marland Kitchen guaiFENesin (MUCINEX) 600 MG 12 hr tablet Take 600 mg by mouth 2 (two) times daily.  . insulin glargine (LANTUS) 100 UNIT/ML injection Inject 0.25 mLs (25 Units total) into the skin at bedtime.  . insulin lispro (HUMALOG) 100 UNIT/ML KiwkPen Inject 0-15 Units into the skin 3 (three) times daily with meals. About 25-30 units total for the day  . lansoprazole (PREVACID) 30 MG capsule Take 60 mg by mouth 2 (two) times daily.   Marland Kitchen loratadine (CLARITIN) 10 MG tablet Take 10 mg by mouth daily.  . magnesium oxide (MAG-OX) 400 (241.3 Mg) MG tablet TAKE ONE TABLET BY MOUTH TWICE A DAY  . Multiple Vitamins-Minerals (CENTRUM SILVER PO) Take 1 tablet by mouth every morning.  . Multiple Vitamins-Minerals (PRESERVISION AREDS 2) CAPS Take 1 capsule by mouth daily. For macular degeneration- eye vitamins   . nitroGLYCERIN (NITROSTAT) 0.4 MG SL tablet Place 0.4 mg under the tongue every 5 (five) minutes as needed for chest pain.  . ranolazine (RANEXA) 500 MG 12 hr tablet Take 1 tablet (500 mg total) by mouth 2 (two) times daily.  . rosuvastatin (CRESTOR) 20 MG  tablet Take 20 mg by mouth daily.  . tamsulosin (FLOMAX) 0.4 MG CAPS capsule Take 0.4 mg by mouth at bedtime.   Marland Kitchen tiotropium (SPIRIVA) 18 MCG inhalation capsule Place 1 capsule (18 mcg total) into inhaler and inhale daily.  Marland Kitchen torsemide (DEMADEX) 20 MG tablet Take 20 mg by mouth 2 (two) times daily.    No facility-administered encounter medications on file as of 10/01/2019.      Jenetta Downer, NP

## 2019-10-11 DIAGNOSIS — E1140 Type 2 diabetes mellitus with diabetic neuropathy, unspecified: Secondary | ICD-10-CM | POA: Diagnosis not present

## 2019-10-11 DIAGNOSIS — S71102A Unspecified open wound, left thigh, initial encounter: Secondary | ICD-10-CM | POA: Diagnosis not present

## 2019-10-11 DIAGNOSIS — K922 Gastrointestinal hemorrhage, unspecified: Secondary | ICD-10-CM | POA: Diagnosis not present

## 2019-10-11 DIAGNOSIS — L89152 Pressure ulcer of sacral region, stage 2: Secondary | ICD-10-CM | POA: Diagnosis not present

## 2019-10-11 DIAGNOSIS — D500 Iron deficiency anemia secondary to blood loss (chronic): Secondary | ICD-10-CM | POA: Diagnosis not present

## 2019-10-11 DIAGNOSIS — I5022 Chronic systolic (congestive) heart failure: Secondary | ICD-10-CM | POA: Diagnosis not present

## 2019-10-11 DIAGNOSIS — I50810 Right heart failure, unspecified: Secondary | ICD-10-CM | POA: Diagnosis not present

## 2019-10-11 DIAGNOSIS — R0902 Hypoxemia: Secondary | ICD-10-CM | POA: Diagnosis not present

## 2019-10-11 DIAGNOSIS — I255 Ischemic cardiomyopathy: Secondary | ICD-10-CM | POA: Diagnosis not present

## 2019-10-11 DIAGNOSIS — I471 Supraventricular tachycardia: Secondary | ICD-10-CM | POA: Diagnosis not present

## 2019-10-12 DIAGNOSIS — I5022 Chronic systolic (congestive) heart failure: Secondary | ICD-10-CM | POA: Diagnosis not present

## 2019-11-11 DIAGNOSIS — I5022 Chronic systolic (congestive) heart failure: Secondary | ICD-10-CM | POA: Diagnosis not present

## 2019-11-12 ENCOUNTER — Other Ambulatory Visit: Payer: PPO | Admitting: Adult Health Nurse Practitioner

## 2019-11-12 ENCOUNTER — Other Ambulatory Visit: Payer: Self-pay

## 2019-11-12 DIAGNOSIS — I5022 Chronic systolic (congestive) heart failure: Secondary | ICD-10-CM

## 2019-11-12 DIAGNOSIS — Z515 Encounter for palliative care: Secondary | ICD-10-CM | POA: Diagnosis not present

## 2019-11-12 NOTE — Progress Notes (Signed)
[facility] Consult Note Telephone: [phone]  Fax: [phone]  PATIENT NAME: [name] DOB: 05-26-1946 MRN: [id number]  PRIMARY CARE PROVIDER:   [name], MD  REFERRING PROVIDER:  [name], MD [place] [facility] [place],  [place] [address]  RESPONSIBLE PARTY:   Self [phone]      RECOMMENDATIONS and PLAN:  1.  Advanced care planning.  Patient is full code.  Family has not had a chance to go over MOST form together.  Have an appointment beginning of February to go over.  2.  Heart failure.  Patient doing well with LVAD.  Patient complains that he had some tenderness around LVAD insertion site.  Denies any fever, discharge, or warmth to touch.  Today there is slight erythema noted.  No swelling, warmth to touch, tenderness to touch, or discharge noted. Granddaughter does state that they were running low and bandaging supplies for the site and was not changing it but every other day.  They now have more supplies. Has appointment at [place] clinic this Wednesday, encouraged to bring up.  Patient denies edema, increased cough or SOB, chest pain.  He also has appointment at heart failure clinic on 12/27/2019.  Continue follow up and recommendations by cardiac specialists.  Overall patient is doing well with no new concerns.  Denies falls or any recent infections.  Is able to get around the house without a walker at times.  Palliative care will continue to monitor for symptom management and make recommendations as needed.   I spent 50 minutes providing this consultation,  from 1:00 to 1:50. More than 50% of the time in this consultation was spent coordinating communication.   HISTORY OF PRESENT ILLNESS:  [name] is a 73 y.o. year old male with multiple medical problems including chronic systolic heart failure,cardiomyopathy, myocardial infarction, hypertension, hypercholesterolemia,  copd,diabetes,anemia, prostate cancer, depression,GERD, gout,macular degeneration, obstructive sleep apnea, on CPAP. Palliative Care was asked to help address goals of care.   CODE STATUS: full code  PPS: 60% HOSPICE ELIGIBILITY/DIAGNOSIS: TBD  PHYSICAL EXAM:   General:  Patient sitting in chair in NAD Extremities: no edema, no joint deformities Skin: no rashes Neurological: Weakness but otherwise nonfocal   PAST MEDICAL HISTORY:  Past Medical History:  Diagnosis Date  . Anemia   . Cancer ([place]) 12/2013   prostate  . Cardiogenic pulmonary edema ([place]) 12/19/2014  . Cardiomyopathy, ischemic   . CHF (congestive heart failure) ([place])   . COPD (chronic obstructive pulmonary disease) ([place])   . Depression   . Diabetes mellitus without complication ([place])   . GERD (gastroesophageal reflux disease)   . Gout   . Hypercholesteremia   . Hypertension   . Myocardial infarction ([place]) [id number]  . OSA on CPAP   . Shortness of breath dyspnea     SOCIAL HX:  Social History   Tobacco Use  . Smoking status: Former Smoker    Packs/day: 2.00    Years: 30.00    Pack years: 60.00    Quit date: 12/03/1996    Years since quitting: 22.9  . Smokeless tobacco: Never Used  Substance Use Topics  . Alcohol use: No    Alcohol/week: 0.0 standard drinks    ALLERGIES:  Allergies  Allergen Reactions  . Brilinta [Ticagrelor] Shortness Of Breath  . Tuberculin Tests Rash  . Benadryl [Diphenhydramine] Other (See Comments)    " Hyperactivity"  . Doxycycline Swelling    Pt went  into pulmonary edema.  . Lopid [Gemfibrozil] Swelling    "I gain 1 pound a day for 30 days."     PERTINENT MEDICATIONS:  Outpatient Encounter Medications as of 11/12/2019  Medication Sig  . acetaminophen (TYLENOL) 500 MG tablet Take 1,000 mg by mouth daily as needed for moderate pain.  Marland Kitchen albuterol (PROVENTIL HFA;VENTOLIN HFA) 108 (90 Base) MCG/ACT inhaler Inhale 2 puffs into the lungs every 6 (six) hours as needed  for wheezing or shortness of breath.  . allopurinol (ZYLOPRIM) 100 MG tablet Take 100 mg by mouth daily.  Marland Kitchen aspirin 81 MG chewable tablet Chew 1 tablet (81 mg total) by mouth daily.  . budesonide-formoterol (SYMBICORT) 80-4.5 MCG/ACT inhaler Inhale 2 puffs into the lungs 2 (two) times daily.  . calcium carbonate (OS-CAL) 600 MG tablet Take 600 mg by mouth 2 (two) times daily.  . calcium carbonate (TUMS - DOSED IN MG ELEMENTAL CALCIUM) 500 MG chewable tablet Chew 1 tablet by mouth as needed for indigestion or heartburn.  . carvedilol (COREG) 3.125 MG tablet Take 1 tablet (3.125 mg total) by mouth 2 (two) times daily with a meal.  . clopidogrel (PLAVIX) 75 MG tablet Take 75 mg by mouth daily.  Marland Kitchen CO-ENZYME Q10 PO Take 30 mg by mouth daily.   . ferrous sulfate 325 (65 FE) MG tablet Take 325 mg by mouth 2 (two) times daily with a meal.  . finasteride (PROSCAR) 5 MG tablet Take 5 mg by mouth daily.  Marland Kitchen FLUoxetine (PROZAC) 20 MG capsule Take 20 mg by mouth daily.  Marland Kitchen gabapentin (NEURONTIN) 300 MG capsule Take 300 mg by mouth 2 (two) times daily.   Marland Kitchen guaiFENesin (MUCINEX) 600 MG 12 hr tablet Take 600 mg by mouth 2 (two) times daily.  . insulin glargine (LANTUS) 100 UNIT/ML injection Inject 0.25 mLs (25 Units total) into the skin at bedtime.  . insulin lispro (HUMALOG) 100 UNIT/ML KiwkPen Inject 0-15 Units into the skin 3 (three) times daily with meals. About 25-30 units total for the day  . lansoprazole (PREVACID) 30 MG capsule Take 60 mg by mouth 2 (two) times daily.   Marland Kitchen loratadine (CLARITIN) 10 MG tablet Take 10 mg by mouth daily.  . magnesium oxide (MAG-OX) 400 (241.3 Mg) MG tablet TAKE ONE TABLET BY MOUTH TWICE A DAY  . Multiple Vitamins-Minerals (CENTRUM SILVER PO) Take 1 tablet by mouth every morning.  . Multiple Vitamins-Minerals (PRESERVISION AREDS 2) CAPS Take 1 capsule by mouth daily. For macular degeneration- eye vitamins   . nitroGLYCERIN (NITROSTAT) 0.4 MG SL tablet Place 0.4 mg under the  tongue every 5 (five) minutes as needed for chest pain.  . ranolazine (RANEXA) 500 MG 12 hr tablet Take 1 tablet (500 mg total) by mouth 2 (two) times daily.  . rosuvastatin (CRESTOR) 20 MG tablet Take 20 mg by mouth daily.  . tamsulosin (FLOMAX) 0.4 MG CAPS capsule Take 0.4 mg by mouth at bedtime.   Marland Kitchen tiotropium (SPIRIVA) 18 MCG inhalation capsule Place 1 capsule (18 mcg total) into inhaler and inhale daily.  Marland Kitchen torsemide (DEMADEX) 20 MG tablet Take 20 mg by mouth 2 (two) times daily.    No facility-administered encounter medications on file as of 11/12/2019.      Cloe Sockwell Jenetta Downer, NP

## 2019-11-21 DIAGNOSIS — E119 Type 2 diabetes mellitus without complications: Secondary | ICD-10-CM | POA: Diagnosis not present

## 2019-11-23 DIAGNOSIS — I10 Essential (primary) hypertension: Secondary | ICD-10-CM | POA: Diagnosis not present

## 2019-11-23 DIAGNOSIS — Z0000 Encounter for general adult medical examination without abnormal findings: Secondary | ICD-10-CM | POA: Diagnosis not present

## 2019-11-23 DIAGNOSIS — D62 Acute posthemorrhagic anemia: Secondary | ICD-10-CM | POA: Diagnosis not present

## 2019-12-01 ENCOUNTER — Other Ambulatory Visit: Payer: Self-pay | Admitting: Family

## 2019-12-12 DIAGNOSIS — Z95811 Presence of heart assist device: Secondary | ICD-10-CM | POA: Diagnosis not present

## 2019-12-12 DIAGNOSIS — I5022 Chronic systolic (congestive) heart failure: Secondary | ICD-10-CM | POA: Diagnosis not present

## 2019-12-12 DIAGNOSIS — Z7901 Long term (current) use of anticoagulants: Secondary | ICD-10-CM | POA: Diagnosis not present

## 2019-12-18 DIAGNOSIS — R0609 Other forms of dyspnea: Secondary | ICD-10-CM | POA: Diagnosis not present

## 2019-12-18 DIAGNOSIS — M79604 Pain in right leg: Secondary | ICD-10-CM | POA: Diagnosis not present

## 2019-12-18 DIAGNOSIS — G4701 Insomnia due to medical condition: Secondary | ICD-10-CM | POA: Insufficient documentation

## 2019-12-18 DIAGNOSIS — R9431 Abnormal electrocardiogram [ECG] [EKG]: Secondary | ICD-10-CM | POA: Diagnosis not present

## 2019-12-18 DIAGNOSIS — I110 Hypertensive heart disease with heart failure: Secondary | ICD-10-CM | POA: Diagnosis not present

## 2019-12-18 DIAGNOSIS — R0600 Dyspnea, unspecified: Secondary | ICD-10-CM | POA: Diagnosis not present

## 2019-12-18 DIAGNOSIS — I10 Essential (primary) hypertension: Secondary | ICD-10-CM | POA: Diagnosis not present

## 2019-12-18 DIAGNOSIS — Z79899 Other long term (current) drug therapy: Secondary | ICD-10-CM | POA: Diagnosis not present

## 2019-12-18 DIAGNOSIS — Z7984 Long term (current) use of oral hypoglycemic drugs: Secondary | ICD-10-CM | POA: Diagnosis not present

## 2019-12-18 DIAGNOSIS — D62 Acute posthemorrhagic anemia: Secondary | ICD-10-CM | POA: Diagnosis not present

## 2019-12-18 DIAGNOSIS — Z951 Presence of aortocoronary bypass graft: Secondary | ICD-10-CM | POA: Diagnosis not present

## 2019-12-18 DIAGNOSIS — E1159 Type 2 diabetes mellitus with other circulatory complications: Secondary | ICD-10-CM | POA: Diagnosis not present

## 2019-12-18 DIAGNOSIS — I519 Heart disease, unspecified: Secondary | ICD-10-CM | POA: Diagnosis not present

## 2019-12-18 DIAGNOSIS — I5022 Chronic systolic (congestive) heart failure: Secondary | ICD-10-CM | POA: Diagnosis not present

## 2019-12-18 DIAGNOSIS — M79651 Pain in right thigh: Secondary | ICD-10-CM | POA: Diagnosis not present

## 2019-12-18 DIAGNOSIS — Z7901 Long term (current) use of anticoagulants: Secondary | ICD-10-CM | POA: Diagnosis not present

## 2019-12-18 DIAGNOSIS — Z95811 Presence of heart assist device: Secondary | ICD-10-CM | POA: Diagnosis not present

## 2019-12-18 DIAGNOSIS — R5381 Other malaise: Secondary | ICD-10-CM | POA: Diagnosis not present

## 2019-12-18 DIAGNOSIS — E1151 Type 2 diabetes mellitus with diabetic peripheral angiopathy without gangrene: Secondary | ICD-10-CM | POA: Diagnosis not present

## 2019-12-18 DIAGNOSIS — Z4509 Encounter for adjustment and management of other cardiac device: Secondary | ICD-10-CM | POA: Diagnosis not present

## 2019-12-28 DIAGNOSIS — Z7901 Long term (current) use of anticoagulants: Secondary | ICD-10-CM | POA: Diagnosis not present

## 2020-01-07 ENCOUNTER — Other Ambulatory Visit: Payer: Self-pay

## 2020-01-07 ENCOUNTER — Other Ambulatory Visit: Payer: PPO | Admitting: Adult Health Nurse Practitioner

## 2020-01-07 DIAGNOSIS — I5022 Chronic systolic (congestive) heart failure: Secondary | ICD-10-CM

## 2020-01-07 DIAGNOSIS — Z515 Encounter for palliative care: Secondary | ICD-10-CM

## 2020-01-07 NOTE — Progress Notes (Signed)
[facility] Consult Note Telephone: [phone]  Fax: [phone]  PATIENT NAME: [name] DOB: October 07, 1946 MRN: [id number]  PRIMARY CARE PROVIDER:   [name], MD  REFERRING PROVIDER:  [name], MD [place] [facility] [place],  [place] [address]  RESPONSIBLE PARTY:   Self [phone]         RECOMMENDATIONS and PLAN:  1.  Advanced care planning.  Patient is full code.  Family has not had a chance to go over MOST form together.  Encouraged to go over as a family and will go over at next visit in 2 months  2.  Heart failure.  Patient continues to do well with LVAD.  Patient has had left leg pain that radiates into calf and thigh.  Believed to be from possible nerve injury due to femoral balloon pump placement prior to LVAD. Does state that it hurts most with weight bearing and will go away when he sits. Patient denies unilateral edema or warm to touch. Started home health PT.  Will do [place] at next visit if pain does not resolve. Overall patient is doing well and denies SOB, cough, fever, chest pain, palpitations, orthopnea, PND, edema.  Continue follow up and recommendations by cardiac specialists.  Denies falls or any recent infections. Is able to get around the house without a walker at times.  Palliative care will continue to monitor for symptom management and make recommendations as needed.   I spent 50 minutes providing this consultation, including time with patient/family, chart review, provider coordination, and documentation . More than 50% of the time in this consultation was spent coordinating communication.   HISTORY OF PRESENT ILLNESS:  [name] is a 74 y.o. year old male with multiple medical problems including chronic systolic heart failure,cardiomyopathy, myocardial infarction, hypertension, hypercholesterolemia, copd,diabetes,anemia, prostate cancer, depression,GERD,  gout,macular degeneration, obstructive sleep apnea, on CPAP. Palliative Care was asked to help address goals of care.   CODE STATUS: full code  PPS: 60% HOSPICE ELIGIBILITY/DIAGNOSIS: TBD  PHYSICAL EXAM:  HR 57  O2 99% on RA General: patient sitting in recliner in NAD Cardiovascular: hear LVAD Pulmonary: lung sounds clear; normal respiratory effort Abdomen: soft, nontender, + bowel sounds GU: no suprapubic tenderness Extremities: no edema, no joint deformities Skin: no rashes on exposed skin Neurological: Weakness but otherwise nonfocal   PAST MEDICAL HISTORY:  Past Medical History:  Diagnosis Date  . Anemia   . Cancer ([place]) 12/2013   prostate  . Cardiogenic pulmonary edema ([place]) 12/19/2014  . Cardiomyopathy, ischemic   . CHF (congestive heart failure) ([place])   . COPD (chronic obstructive pulmonary disease) ([place])   . Depression   . Diabetes mellitus without complication ([place])   . GERD (gastroesophageal reflux disease)   . Gout   . Hypercholesteremia   . Hypertension   . Myocardial infarction ([place]) [id number]  . OSA on CPAP   . Shortness of breath dyspnea     SOCIAL HX:  Social History   Tobacco Use  . Smoking status: Former Smoker    Packs/day: 2.00    Years: 30.00    Pack years: 60.00    Quit date: 12/03/1996    Years since quitting: 23.1  . Smokeless tobacco: Never Used  Substance Use Topics  . Alcohol use: No    Alcohol/week: 0.0 standard drinks    ALLERGIES:  Allergies  Allergen Reactions  . Brilinta [Ticagrelor] Shortness Of Breath  . Tuberculin Tests Rash  .  Benadryl [Diphenhydramine] Other (See Comments)    " Hyperactivity"  . Doxycycline Swelling    Pt went into pulmonary edema.  . Lopid [Gemfibrozil] Swelling    "I gain 1 pound a day for 30 days."     PERTINENT MEDICATIONS:  Outpatient Encounter Medications as of 01/07/2020  Medication Sig  . acetaminophen (TYLENOL) 500 MG tablet Take 1,000 mg by mouth daily as needed for moderate pain.   Marland Kitchen albuterol (PROVENTIL HFA;VENTOLIN HFA) 108 (90 Base) MCG/ACT inhaler Inhale 2 puffs into the lungs every 6 (six) hours as needed for wheezing or shortness of breath.  . allopurinol (ZYLOPRIM) 100 MG tablet Take 100 mg by mouth daily.  Marland Kitchen aspirin 81 MG chewable tablet Chew 1 tablet (81 mg total) by mouth daily.  . budesonide-formoterol (SYMBICORT) 80-4.5 MCG/ACT inhaler Inhale 2 puffs into the lungs 2 (two) times daily.  . calcium carbonate (OS-CAL) 600 MG tablet Take 600 mg by mouth 2 (two) times daily.  . calcium carbonate (TUMS - DOSED IN MG ELEMENTAL CALCIUM) 500 MG chewable tablet Chew 1 tablet by mouth as needed for indigestion or heartburn.  . carvedilol (COREG) 3.125 MG tablet Take 1 tablet (3.125 mg total) by mouth 2 (two) times daily with a meal.  . clopidogrel (PLAVIX) 75 MG tablet Take 75 mg by mouth daily.  Marland Kitchen CO-ENZYME Q10 PO Take 30 mg by mouth daily.   . ferrous sulfate 325 (65 FE) MG tablet Take 325 mg by mouth 2 (two) times daily with a meal.  . finasteride (PROSCAR) 5 MG tablet Take 5 mg by mouth daily.  Marland Kitchen FLUoxetine (PROZAC) 20 MG capsule Take 20 mg by mouth daily.  Marland Kitchen gabapentin (NEURONTIN) 300 MG capsule Take 300 mg by mouth 2 (two) times daily.   Marland Kitchen guaiFENesin (MUCINEX) 600 MG 12 hr tablet Take 600 mg by mouth 2 (two) times daily.  . insulin glargine (LANTUS) 100 UNIT/ML injection Inject 0.25 mLs (25 Units total) into the skin at bedtime.  . insulin lispro (HUMALOG) 100 UNIT/ML KiwkPen Inject 0-15 Units into the skin 3 (three) times daily with meals. About 25-30 units total for the day  . lansoprazole (PREVACID) 30 MG capsule Take 60 mg by mouth 2 (two) times daily.   Marland Kitchen loratadine (CLARITIN) 10 MG tablet Take 10 mg by mouth daily.  . magnesium oxide (MAG-OX) 400 (241.3 Mg) MG tablet TAKE ONE TABLET BY MOUTH TWICE A DAY  . Multiple Vitamins-Minerals (CENTRUM SILVER PO) Take 1 tablet by mouth every morning.  . Multiple Vitamins-Minerals (PRESERVISION AREDS 2) CAPS Take 1  capsule by mouth daily. For macular degeneration- eye vitamins   . nitroGLYCERIN (NITROSTAT) 0.4 MG SL tablet Place 0.4 mg under the tongue every 5 (five) minutes as needed for chest pain.  . ranolazine (RANEXA) 500 MG 12 hr tablet Take 1 tablet (500 mg total) by mouth 2 (two) times daily.  . rosuvastatin (CRESTOR) 20 MG tablet Take 20 mg by mouth daily.  . tamsulosin (FLOMAX) 0.4 MG CAPS capsule Take 0.4 mg by mouth at bedtime.   Marland Kitchen tiotropium (SPIRIVA) 18 MCG inhalation capsule Place 1 capsule (18 mcg total) into inhaler and inhale daily.  Marland Kitchen torsemide (DEMADEX) 20 MG tablet Take 20 mg by mouth 2 (two) times daily.    No facility-administered encounter medications on file as of 01/07/2020.     Analy Bassford Jenetta Downer, NP

## 2020-01-12 DIAGNOSIS — I5022 Chronic systolic (congestive) heart failure: Secondary | ICD-10-CM | POA: Diagnosis not present

## 2020-01-17 DIAGNOSIS — Z7901 Long term (current) use of anticoagulants: Secondary | ICD-10-CM | POA: Diagnosis not present

## 2020-02-08 DIAGNOSIS — E119 Type 2 diabetes mellitus without complications: Secondary | ICD-10-CM | POA: Diagnosis not present

## 2020-02-09 DIAGNOSIS — I5022 Chronic systolic (congestive) heart failure: Secondary | ICD-10-CM | POA: Diagnosis not present

## 2020-02-14 DIAGNOSIS — Z7901 Long term (current) use of anticoagulants: Secondary | ICD-10-CM | POA: Diagnosis not present

## 2020-03-04 ENCOUNTER — Telehealth: Payer: Self-pay | Admitting: Adult Health Nurse Practitioner

## 2020-03-04 ENCOUNTER — Other Ambulatory Visit: Payer: PPO | Admitting: Adult Health Nurse Practitioner

## 2020-03-04 ENCOUNTER — Other Ambulatory Visit: Payer: Self-pay

## 2020-03-04 NOTE — Telephone Encounter (Signed)
Showed up at patient's home for 1pm appointment.  No answer at door.  Left VM with contact info to reschedule. Oakley Orban K. Olena Heckle NP

## 2020-03-11 DIAGNOSIS — I5022 Chronic systolic (congestive) heart failure: Secondary | ICD-10-CM | POA: Diagnosis not present

## 2020-03-13 DIAGNOSIS — Z7901 Long term (current) use of anticoagulants: Secondary | ICD-10-CM | POA: Diagnosis not present

## 2020-04-07 DIAGNOSIS — I5022 Chronic systolic (congestive) heart failure: Secondary | ICD-10-CM | POA: Diagnosis not present

## 2020-04-10 DIAGNOSIS — I5022 Chronic systolic (congestive) heart failure: Secondary | ICD-10-CM | POA: Diagnosis not present

## 2020-04-24 DIAGNOSIS — Z7901 Long term (current) use of anticoagulants: Secondary | ICD-10-CM | POA: Diagnosis not present

## 2020-05-02 ENCOUNTER — Telehealth: Payer: Self-pay

## 2020-05-02 NOTE — Telephone Encounter (Signed)
Phone call placed to patient to check in and ask if he would like schedule a follow up visit with Palliative care. Spoke with patient's wife who requested a call back as patient is sleeping.

## 2020-05-08 DIAGNOSIS — E119 Type 2 diabetes mellitus without complications: Secondary | ICD-10-CM | POA: Diagnosis not present

## 2020-05-11 DIAGNOSIS — I5022 Chronic systolic (congestive) heart failure: Secondary | ICD-10-CM | POA: Diagnosis not present

## 2020-05-23 DIAGNOSIS — Z7901 Long term (current) use of anticoagulants: Secondary | ICD-10-CM | POA: Diagnosis not present

## 2020-06-10 DIAGNOSIS — I5022 Chronic systolic (congestive) heart failure: Secondary | ICD-10-CM | POA: Diagnosis not present

## 2020-06-16 ENCOUNTER — Telehealth: Payer: Self-pay

## 2020-06-16 NOTE — Telephone Encounter (Signed)
05/07/20 volunteer follow up call for palliative made, no answer

## 2020-06-19 DIAGNOSIS — Z7901 Long term (current) use of anticoagulants: Secondary | ICD-10-CM | POA: Diagnosis not present

## 2020-07-10 ENCOUNTER — Telehealth: Payer: Self-pay

## 2020-07-10 NOTE — Telephone Encounter (Signed)
RN call to schedule palliative follow up at home. Pt reports he is admitted to [facility] hospital for Pulmonary Edema. Hospital Liaison team notified and NP. Will schedule follow up appt once patient is discharged

## 2020-07-13 DIAGNOSIS — D509 Iron deficiency anemia, unspecified: Secondary | ICD-10-CM | POA: Insufficient documentation

## 2020-07-30 DIAGNOSIS — Z95811 Presence of heart assist device: Secondary | ICD-10-CM | POA: Diagnosis not present

## 2020-07-30 DIAGNOSIS — K921 Melena: Secondary | ICD-10-CM | POA: Diagnosis not present

## 2020-07-30 DIAGNOSIS — Z7901 Long term (current) use of anticoagulants: Secondary | ICD-10-CM | POA: Diagnosis not present

## 2020-08-01 DIAGNOSIS — Z7901 Long term (current) use of anticoagulants: Secondary | ICD-10-CM | POA: Diagnosis not present

## 2020-08-01 DIAGNOSIS — R0602 Shortness of breath: Secondary | ICD-10-CM | POA: Diagnosis not present

## 2020-08-04 ENCOUNTER — Telehealth: Payer: Self-pay

## 2020-08-04 NOTE — Telephone Encounter (Signed)
Phone call placed to patient to check in and to offer to schedule a follow up visit with Palliative NP. Patient receptive to visit. Scheduled for 08/13/20 @ 12 noon with Hanley Ben NP

## 2020-08-06 DIAGNOSIS — J9811 Atelectasis: Secondary | ICD-10-CM | POA: Diagnosis not present

## 2020-08-06 DIAGNOSIS — I509 Heart failure, unspecified: Secondary | ICD-10-CM | POA: Diagnosis not present

## 2020-08-06 DIAGNOSIS — I5022 Chronic systolic (congestive) heart failure: Secondary | ICD-10-CM | POA: Diagnosis not present

## 2020-08-06 DIAGNOSIS — J811 Chronic pulmonary edema: Secondary | ICD-10-CM | POA: Diagnosis not present

## 2020-08-12 DIAGNOSIS — N400 Enlarged prostate without lower urinary tract symptoms: Secondary | ICD-10-CM | POA: Diagnosis not present

## 2020-08-12 DIAGNOSIS — I5022 Chronic systolic (congestive) heart failure: Secondary | ICD-10-CM | POA: Diagnosis not present

## 2020-08-12 DIAGNOSIS — K922 Gastrointestinal hemorrhage, unspecified: Secondary | ICD-10-CM | POA: Diagnosis not present

## 2020-08-12 DIAGNOSIS — Z87891 Personal history of nicotine dependence: Secondary | ICD-10-CM | POA: Diagnosis not present

## 2020-08-12 DIAGNOSIS — E785 Hyperlipidemia, unspecified: Secondary | ICD-10-CM | POA: Diagnosis not present

## 2020-08-12 DIAGNOSIS — I130 Hypertensive heart and chronic kidney disease with heart failure and stage 1 through stage 4 chronic kidney disease, or unspecified chronic kidney disease: Secondary | ICD-10-CM | POA: Diagnosis not present

## 2020-08-12 DIAGNOSIS — J449 Chronic obstructive pulmonary disease, unspecified: Secondary | ICD-10-CM | POA: Diagnosis not present

## 2020-08-12 DIAGNOSIS — Z951 Presence of aortocoronary bypass graft: Secondary | ICD-10-CM | POA: Diagnosis not present

## 2020-08-12 DIAGNOSIS — B952 Enterococcus as the cause of diseases classified elsewhere: Secondary | ICD-10-CM | POA: Diagnosis not present

## 2020-08-12 DIAGNOSIS — E1122 Type 2 diabetes mellitus with diabetic chronic kidney disease: Secondary | ICD-10-CM | POA: Diagnosis not present

## 2020-08-12 DIAGNOSIS — Z9981 Dependence on supplemental oxygen: Secondary | ICD-10-CM | POA: Diagnosis not present

## 2020-08-12 DIAGNOSIS — Q2733 Arteriovenous malformation of digestive system vessel: Secondary | ICD-10-CM | POA: Diagnosis not present

## 2020-08-12 DIAGNOSIS — Z955 Presence of coronary angioplasty implant and graft: Secondary | ICD-10-CM | POA: Diagnosis not present

## 2020-08-12 DIAGNOSIS — Z95810 Presence of automatic (implantable) cardiac defibrillator: Secondary | ICD-10-CM | POA: Diagnosis not present

## 2020-08-12 DIAGNOSIS — Z792 Long term (current) use of antibiotics: Secondary | ICD-10-CM | POA: Diagnosis not present

## 2020-08-12 DIAGNOSIS — I255 Ischemic cardiomyopathy: Secondary | ICD-10-CM | POA: Diagnosis not present

## 2020-08-12 DIAGNOSIS — I2510 Atherosclerotic heart disease of native coronary artery without angina pectoris: Secondary | ICD-10-CM | POA: Diagnosis not present

## 2020-08-12 DIAGNOSIS — N189 Chronic kidney disease, unspecified: Secondary | ICD-10-CM | POA: Diagnosis not present

## 2020-08-12 DIAGNOSIS — R7881 Bacteremia: Secondary | ICD-10-CM | POA: Diagnosis not present

## 2020-08-12 DIAGNOSIS — T829XXD Unspecified complication of cardiac and vascular prosthetic device, implant and graft, subsequent encounter: Secondary | ICD-10-CM | POA: Diagnosis not present

## 2020-08-12 DIAGNOSIS — Z95811 Presence of heart assist device: Secondary | ICD-10-CM | POA: Diagnosis not present

## 2020-08-13 ENCOUNTER — Other Ambulatory Visit: Payer: PPO | Admitting: Adult Health Nurse Practitioner

## 2020-08-13 ENCOUNTER — Other Ambulatory Visit: Payer: Self-pay

## 2020-08-13 DIAGNOSIS — I5022 Chronic systolic (congestive) heart failure: Secondary | ICD-10-CM

## 2020-08-13 DIAGNOSIS — Z515 Encounter for palliative care: Secondary | ICD-10-CM

## 2020-08-13 NOTE — Progress Notes (Signed)
[facility] Consult Note Telephone: [phone]  Fax: [phone]  PATIENT NAME: [name] DOB: 01-30-1946 MRN: [id number]  PRIMARY CARE PROVIDER:   [name], MD  REFERRING PROVIDER:  [name], MD [facility] [place],  [place] [address]  RESPONSIBLE PARTY:   Self [phone]       RECOMMENDATIONS and PLAN:  1.  Advanced care planning. Patient is full code.  Filled out MOST form today.  Patient wants CPR, full hospital interventions (until no more brain activity), antibiotics and IV fluids as indicated, feeding tube for long-term placement if indicated.  MOST form uploaded toVynca and original left in the home.  2.  Pain.  Patient gets pain in lower left leg with weightbearing.  States that the pain goes away when he sits down to rest.  States that they feel like this may be related to his circulation and has further CT scan to be done later this month on the left leg.  Continue with further follow-up and recommendations by providers.  3.  Functional status.  Patient is able to ambulate unassisted.  Denies falls.  Does state that after last 2 hospitalizations that he does get increased short of breath and DOE.  Does have pulmonary appointment later this month to evaluate his increased shortness of breath.  Patient is not undergoing any physical therapy at this time.  States that they did not want to start this until after further evaluation of pain in his left leg, see below.  Continue follow-up and recommendations by providers for his leg pain.  Continue supportive care at home.  Palliative will continue to monitor for symptom management/decline and make recommendations as needed.  Next appointment is in 8 weeks.  Encouraged to call with any questions or concerns  I spent 60 minutes providing this consultation,  from 12:00 to 1:00 including time spent with patient/family, chart review,  provider coordination, documentation. More than 50% of the time in this consultation was spent coordinating communication.   HISTORY OF PRESENT ILLNESS:  [name] is a 74 y.o. year old male with multiple medical problems including chronic systolic heart failure,cardiomyopathy, myocardial infarction, hypertension, hypercholesterolemia, copd,diabetes,anemia, prostate cancer, depression,GERD, gout,macular degeneration, obstructive sleep apnea, on CPAP. Palliative Care was asked to help address goals of care.  Patient hospitalized 8/2 through 07/15/2020 for acute respiratory failure and was found to have anterococcus bacteremia.  Was treated with IV antibiotics.  After patient went home he continued to feel bad and have weakness and increased shortness of breath.  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  Patient did require 1 unit of blood transfused.  Patient was started on scheduled torsemide.  Patient states that his weight has been stable between 213 to 216 pounds.  Patient went home on PICC line and continue to have antibiotic therapy for the Enterococcus bacteremia.  Patient states that IV antibiotics will stop on 08/21/2020 and PICC line is scheduled be taken out on 08/22/2020.  After this patient will be starting amoxicillin twice daily for the rest of his life.  On 09/02/2020 he has appointment at [facility] and with pulmonary.  States that he has continued shortness of breath even with talking.  States that at his pulmonology appointment this is going to be checked.  Patient also has appointment with infectious disease on 10/07/2020 and with his oncologist on 10/17/2020.  Patient states  that his appetite has decreased.  He still eats 3 meals a day but eats less at those meals.  Denies any weight loss.  Denies fever, cough, headaches, dizziness, N/V/D, constipation, dysuria, hematuria, melena, hematochezia.  CODE STATUS: full code  PPS:  60% HOSPICE ELIGIBILITY/DIAGNOSIS: TBD  PHYSICAL EXAM:   General: NAD, frail appearing Extremities:  no joint deformities Skin: no rashes on exposed skin Neurological: Weakness but otherwise nonfocal  PAST MEDICAL HISTORY:  Past Medical History:  Diagnosis Date   Anemia    Cancer ([place]) 12/2013   prostate   Cardiogenic pulmonary edema ([place]) 12/19/2014   Cardiomyopathy, ischemic    CHF (congestive heart failure) ([facility])    COPD (chronic obstructive pulmonary disease) ([facility])    Depression    Diabetes mellitus without complication ([facility])    GERD (gastroesophageal reflux disease)    Gout    Hypercholesteremia    Hypertension    Myocardial infarction ([place]) [id number]   OSA on CPAP    Shortness of breath dyspnea     SOCIAL HX:  Social History   Tobacco Use   Smoking status: Former Smoker    Packs/day: 2.00    Years: 30.00    Pack years: 60.00    Quit date: 12/03/1996    Years since quitting: 23.7   Smokeless tobacco: Never Used  Substance Use Topics   Alcohol use: No    Alcohol/week: 0.0 standard drinks    ALLERGIES:  Allergies  Allergen Reactions   Brilinta [Ticagrelor] Shortness Of Breath   Tuberculin Tests Rash   Benadryl [Diphenhydramine] Other (See Comments)    " Hyperactivity"   Doxycycline Swelling    Pt went into pulmonary edema.   Lopid [Gemfibrozil] Swelling    "I gain 1 pound a day for 30 days."     PERTINENT MEDICATIONS:  Outpatient Encounter Medications as of 08/13/2020  Medication Sig   acetaminophen (TYLENOL) 500 MG tablet Take 1,000 mg by mouth daily as needed for moderate pain.   albuterol (PROVENTIL HFA;VENTOLIN HFA) 108 (90 Base) MCG/ACT inhaler Inhale 2 puffs into the lungs every 6 (six) hours as needed for wheezing or shortness of breath.   allopurinol (ZYLOPRIM) 100 MG tablet Take 100 mg by mouth daily.   aspirin 81 MG chewable tablet Chew 1 tablet (81 mg total) by mouth daily.   budesonide-formoterol (SYMBICORT)  80-4.5 MCG/ACT inhaler Inhale 2 puffs into the lungs 2 (two) times daily.   calcium carbonate (OS-CAL) 600 MG tablet Take 600 mg by mouth 2 (two) times daily.   calcium carbonate (TUMS - DOSED IN MG ELEMENTAL CALCIUM) 500 MG chewable tablet Chew 1 tablet by mouth as needed for indigestion or heartburn.   carvedilol (COREG) 3.125 MG tablet Take 1 tablet (3.125 mg total) by mouth 2 (two) times daily with a meal.   clopidogrel (PLAVIX) 75 MG tablet Take 75 mg by mouth daily.   CO-ENZYME Q10 PO Take 30 mg by mouth daily.    ferrous sulfate 325 (65 FE) MG tablet Take 325 mg by mouth 2 (two) times daily with a meal.   finasteride (PROSCAR) 5 MG tablet Take 5 mg by mouth daily.   FLUoxetine (PROZAC) 20 MG capsule Take 20 mg by mouth daily.   gabapentin (NEURONTIN) 300 MG capsule Take 300 mg by mouth 2 (two) times daily.    guaiFENesin (MUCINEX) 600 MG 12 hr tablet Take 600 mg by mouth 2 (two) times daily.   insulin glargine (LANTUS) 100 UNIT/ML injection Inject 0.25 mLs (25  Units total) into the skin at bedtime.   insulin lispro (HUMALOG) 100 UNIT/ML KiwkPen Inject 0-15 Units into the skin 3 (three) times daily with meals. About 25-30 units total for the day   lansoprazole (PREVACID) 30 MG capsule Take 60 mg by mouth 2 (two) times daily.    loratadine (CLARITIN) 10 MG tablet Take 10 mg by mouth daily.   magnesium oxide (MAG-OX) 400 (241.3 Mg) MG tablet TAKE ONE TABLET BY MOUTH TWICE A DAY   Multiple Vitamins-Minerals (CENTRUM SILVER PO) Take 1 tablet by mouth every morning.   Multiple Vitamins-Minerals (PRESERVISION AREDS 2) CAPS Take 1 capsule by mouth daily. For macular degeneration- eye vitamins    nitroGLYCERIN (NITROSTAT) 0.4 MG SL tablet Place 0.4 mg under the tongue every 5 (five) minutes as needed for chest pain.   ranolazine (RANEXA) 500 MG 12 hr tablet Take 1 tablet (500 mg total) by mouth 2 (two) times daily.   rosuvastatin (CRESTOR) 20 MG tablet Take 20 mg by mouth daily.    tamsulosin (FLOMAX) 0.4 MG CAPS capsule Take 0.4 mg by mouth at bedtime.    tiotropium (SPIRIVA) 18 MCG inhalation capsule Place 1 capsule (18 mcg total) into inhaler and inhale daily.   torsemide (DEMADEX) 20 MG tablet Take 20 mg by mouth 2 (two) times daily.    No facility-administered encounter medications on file as of 08/13/2020.      Chinonso Linker Jenetta Downer, NP

## 2020-08-21 DIAGNOSIS — K921 Melena: Secondary | ICD-10-CM | POA: Diagnosis not present

## 2020-08-29 DIAGNOSIS — Z7901 Long term (current) use of anticoagulants: Secondary | ICD-10-CM | POA: Diagnosis not present

## 2020-09-02 DIAGNOSIS — Z95811 Presence of heart assist device: Secondary | ICD-10-CM | POA: Diagnosis not present

## 2020-09-02 DIAGNOSIS — I5022 Chronic systolic (congestive) heart failure: Secondary | ICD-10-CM | POA: Diagnosis not present

## 2020-09-02 DIAGNOSIS — J984 Other disorders of lung: Secondary | ICD-10-CM | POA: Diagnosis not present

## 2020-09-02 DIAGNOSIS — R0600 Dyspnea, unspecified: Secondary | ICD-10-CM | POA: Diagnosis not present

## 2020-09-02 DIAGNOSIS — Z7901 Long term (current) use of anticoagulants: Secondary | ICD-10-CM | POA: Diagnosis not present

## 2020-09-26 DIAGNOSIS — E119 Type 2 diabetes mellitus without complications: Secondary | ICD-10-CM | POA: Diagnosis not present

## 2020-09-26 DIAGNOSIS — Z7901 Long term (current) use of anticoagulants: Secondary | ICD-10-CM | POA: Diagnosis not present

## 2020-09-29 DIAGNOSIS — Z4502 Encounter for adjustment and management of automatic implantable cardiac defibrillator: Secondary | ICD-10-CM | POA: Diagnosis not present

## 2020-09-29 DIAGNOSIS — I5022 Chronic systolic (congestive) heart failure: Secondary | ICD-10-CM | POA: Diagnosis not present

## 2020-09-29 DIAGNOSIS — I472 Ventricular tachycardia: Secondary | ICD-10-CM | POA: Diagnosis not present

## 2020-10-06 DIAGNOSIS — I5022 Chronic systolic (congestive) heart failure: Secondary | ICD-10-CM | POA: Diagnosis not present

## 2020-10-13 DIAGNOSIS — E1159 Type 2 diabetes mellitus with other circulatory complications: Secondary | ICD-10-CM | POA: Diagnosis not present

## 2020-10-13 DIAGNOSIS — E1122 Type 2 diabetes mellitus with diabetic chronic kidney disease: Secondary | ICD-10-CM | POA: Diagnosis not present

## 2020-10-13 DIAGNOSIS — E1165 Type 2 diabetes mellitus with hyperglycemia: Secondary | ICD-10-CM | POA: Diagnosis not present

## 2020-10-13 DIAGNOSIS — Z794 Long term (current) use of insulin: Secondary | ICD-10-CM | POA: Diagnosis not present

## 2020-10-13 DIAGNOSIS — N1832 Chronic kidney disease, stage 3b: Secondary | ICD-10-CM | POA: Diagnosis not present

## 2020-10-14 ENCOUNTER — Other Ambulatory Visit: Payer: Self-pay

## 2020-10-14 ENCOUNTER — Telehealth: Payer: Self-pay | Admitting: Adult Health Nurse Practitioner

## 2020-10-14 ENCOUNTER — Other Ambulatory Visit: Payer: PPO | Admitting: Adult Health Nurse Practitioner

## 2020-10-14 DIAGNOSIS — B952 Enterococcus as the cause of diseases classified elsewhere: Secondary | ICD-10-CM | POA: Diagnosis not present

## 2020-10-14 DIAGNOSIS — E1122 Type 2 diabetes mellitus with diabetic chronic kidney disease: Secondary | ICD-10-CM | POA: Diagnosis not present

## 2020-10-14 DIAGNOSIS — I5022 Chronic systolic (congestive) heart failure: Secondary | ICD-10-CM | POA: Diagnosis not present

## 2020-10-14 DIAGNOSIS — J449 Chronic obstructive pulmonary disease, unspecified: Secondary | ICD-10-CM | POA: Diagnosis not present

## 2020-10-14 DIAGNOSIS — Z7901 Long term (current) use of anticoagulants: Secondary | ICD-10-CM | POA: Diagnosis not present

## 2020-10-14 DIAGNOSIS — Z955 Presence of coronary angioplasty implant and graft: Secondary | ICD-10-CM | POA: Diagnosis not present

## 2020-10-14 DIAGNOSIS — Z792 Long term (current) use of antibiotics: Secondary | ICD-10-CM | POA: Diagnosis not present

## 2020-10-14 DIAGNOSIS — Z87891 Personal history of nicotine dependence: Secondary | ICD-10-CM | POA: Diagnosis not present

## 2020-10-14 DIAGNOSIS — T827XXS Infection and inflammatory reaction due to other cardiac and vascular devices, implants and grafts, sequela: Secondary | ICD-10-CM | POA: Diagnosis not present

## 2020-10-14 DIAGNOSIS — E785 Hyperlipidemia, unspecified: Secondary | ICD-10-CM | POA: Diagnosis not present

## 2020-10-14 DIAGNOSIS — R7881 Bacteremia: Secondary | ICD-10-CM | POA: Diagnosis not present

## 2020-10-14 DIAGNOSIS — Z8719 Personal history of other diseases of the digestive system: Secondary | ICD-10-CM | POA: Diagnosis not present

## 2020-10-14 DIAGNOSIS — I130 Hypertensive heart and chronic kidney disease with heart failure and stage 1 through stage 4 chronic kidney disease, or unspecified chronic kidney disease: Secondary | ICD-10-CM | POA: Diagnosis not present

## 2020-10-14 DIAGNOSIS — I2510 Atherosclerotic heart disease of native coronary artery without angina pectoris: Secondary | ICD-10-CM | POA: Diagnosis not present

## 2020-10-14 DIAGNOSIS — Z95811 Presence of heart assist device: Secondary | ICD-10-CM | POA: Diagnosis not present

## 2020-10-14 DIAGNOSIS — Z09 Encounter for follow-up examination after completed treatment for conditions other than malignant neoplasm: Secondary | ICD-10-CM | POA: Diagnosis not present

## 2020-10-14 DIAGNOSIS — N400 Enlarged prostate without lower urinary tract symptoms: Secondary | ICD-10-CM | POA: Diagnosis not present

## 2020-10-14 DIAGNOSIS — Z95810 Presence of automatic (implantable) cardiac defibrillator: Secondary | ICD-10-CM | POA: Diagnosis not present

## 2020-10-14 DIAGNOSIS — T827XXA Infection and inflammatory reaction due to other cardiac and vascular devices, implants and grafts, initial encounter: Secondary | ICD-10-CM | POA: Diagnosis not present

## 2020-10-14 DIAGNOSIS — I255 Ischemic cardiomyopathy: Secondary | ICD-10-CM | POA: Diagnosis not present

## 2020-10-14 DIAGNOSIS — Z8619 Personal history of other infectious and parasitic diseases: Secondary | ICD-10-CM | POA: Diagnosis not present

## 2020-10-14 DIAGNOSIS — N183 Chronic kidney disease, stage 3 unspecified: Secondary | ICD-10-CM | POA: Diagnosis not present

## 2020-10-14 DIAGNOSIS — Z9981 Dependence on supplemental oxygen: Secondary | ICD-10-CM | POA: Diagnosis not present

## 2020-10-14 DIAGNOSIS — Z951 Presence of aortocoronary bypass graft: Secondary | ICD-10-CM | POA: Diagnosis not present

## 2020-10-14 NOTE — Telephone Encounter (Signed)
Arrived at home for 12p visit.  No answer when rang the doorbell.  Called and left VM with reason for call and call back info Elzabeth Mcquerry K. Olena Heckle NP

## 2020-10-17 DIAGNOSIS — T827XXA Infection and inflammatory reaction due to other cardiac and vascular devices, implants and grafts, initial encounter: Secondary | ICD-10-CM | POA: Diagnosis not present

## 2020-10-17 DIAGNOSIS — C61 Malignant neoplasm of prostate: Secondary | ICD-10-CM | POA: Diagnosis not present

## 2020-10-31 DIAGNOSIS — Z7901 Long term (current) use of anticoagulants: Secondary | ICD-10-CM | POA: Diagnosis not present

## 2020-11-28 DIAGNOSIS — Z7901 Long term (current) use of anticoagulants: Secondary | ICD-10-CM | POA: Diagnosis not present

## 2020-12-09 DIAGNOSIS — I70219 Atherosclerosis of native arteries of extremities with intermittent claudication, unspecified extremity: Secondary | ICD-10-CM | POA: Diagnosis not present

## 2020-12-09 DIAGNOSIS — D638 Anemia in other chronic diseases classified elsewhere: Secondary | ICD-10-CM | POA: Diagnosis not present

## 2020-12-09 DIAGNOSIS — I10 Essential (primary) hypertension: Secondary | ICD-10-CM | POA: Diagnosis not present

## 2020-12-09 DIAGNOSIS — Z0000 Encounter for general adult medical examination without abnormal findings: Secondary | ICD-10-CM | POA: Diagnosis not present

## 2020-12-09 DIAGNOSIS — F320 Major depressive disorder, single episode, mild: Secondary | ICD-10-CM | POA: Diagnosis not present

## 2020-12-10 DIAGNOSIS — Z794 Long term (current) use of insulin: Secondary | ICD-10-CM | POA: Diagnosis not present

## 2020-12-10 DIAGNOSIS — E1159 Type 2 diabetes mellitus with other circulatory complications: Secondary | ICD-10-CM | POA: Diagnosis not present

## 2020-12-10 DIAGNOSIS — N1832 Chronic kidney disease, stage 3b: Secondary | ICD-10-CM | POA: Diagnosis not present

## 2020-12-10 DIAGNOSIS — E1122 Type 2 diabetes mellitus with diabetic chronic kidney disease: Secondary | ICD-10-CM | POA: Diagnosis not present

## 2020-12-17 DIAGNOSIS — E119 Type 2 diabetes mellitus without complications: Secondary | ICD-10-CM | POA: Diagnosis not present

## 2020-12-22 DIAGNOSIS — E119 Type 2 diabetes mellitus without complications: Secondary | ICD-10-CM | POA: Diagnosis not present

## 2020-12-27 DIAGNOSIS — Z7901 Long term (current) use of anticoagulants: Secondary | ICD-10-CM | POA: Diagnosis not present

## 2020-12-29 DIAGNOSIS — I472 Ventricular tachycardia: Secondary | ICD-10-CM | POA: Diagnosis not present

## 2020-12-29 DIAGNOSIS — Z4502 Encounter for adjustment and management of automatic implantable cardiac defibrillator: Secondary | ICD-10-CM | POA: Diagnosis not present

## 2020-12-29 DIAGNOSIS — I5022 Chronic systolic (congestive) heart failure: Secondary | ICD-10-CM | POA: Diagnosis not present

## 2020-12-30 DIAGNOSIS — J449 Chronic obstructive pulmonary disease, unspecified: Secondary | ICD-10-CM | POA: Diagnosis not present

## 2020-12-30 DIAGNOSIS — D6869 Other thrombophilia: Secondary | ICD-10-CM | POA: Diagnosis not present

## 2020-12-30 DIAGNOSIS — C61 Malignant neoplasm of prostate: Secondary | ICD-10-CM | POA: Diagnosis not present

## 2020-12-30 DIAGNOSIS — N183 Chronic kidney disease, stage 3 unspecified: Secondary | ICD-10-CM | POA: Diagnosis not present

## 2020-12-30 DIAGNOSIS — E1151 Type 2 diabetes mellitus with diabetic peripheral angiopathy without gangrene: Secondary | ICD-10-CM | POA: Diagnosis not present

## 2020-12-30 DIAGNOSIS — I25118 Atherosclerotic heart disease of native coronary artery with other forms of angina pectoris: Secondary | ICD-10-CM | POA: Diagnosis not present

## 2020-12-30 DIAGNOSIS — E662 Morbid (severe) obesity with alveolar hypoventilation: Secondary | ICD-10-CM | POA: Diagnosis not present

## 2020-12-30 DIAGNOSIS — E1122 Type 2 diabetes mellitus with diabetic chronic kidney disease: Secondary | ICD-10-CM | POA: Diagnosis not present

## 2020-12-30 DIAGNOSIS — E1169 Type 2 diabetes mellitus with other specified complication: Secondary | ICD-10-CM | POA: Diagnosis not present

## 2020-12-30 DIAGNOSIS — E261 Secondary hyperaldosteronism: Secondary | ICD-10-CM | POA: Diagnosis not present

## 2020-12-30 DIAGNOSIS — F1021 Alcohol dependence, in remission: Secondary | ICD-10-CM | POA: Diagnosis not present

## 2020-12-30 DIAGNOSIS — I509 Heart failure, unspecified: Secondary | ICD-10-CM | POA: Diagnosis not present

## 2021-01-06 DIAGNOSIS — R1110 Vomiting, unspecified: Secondary | ICD-10-CM | POA: Diagnosis not present

## 2021-01-06 DIAGNOSIS — K219 Gastro-esophageal reflux disease without esophagitis: Secondary | ICD-10-CM | POA: Diagnosis not present

## 2021-01-06 DIAGNOSIS — R9431 Abnormal electrocardiogram [ECG] [EKG]: Secondary | ICD-10-CM | POA: Diagnosis not present

## 2021-01-06 DIAGNOSIS — I255 Ischemic cardiomyopathy: Secondary | ICD-10-CM | POA: Diagnosis not present

## 2021-01-06 DIAGNOSIS — I10 Essential (primary) hypertension: Secondary | ICD-10-CM | POA: Diagnosis not present

## 2021-01-06 DIAGNOSIS — I110 Hypertensive heart disease with heart failure: Secondary | ICD-10-CM | POA: Diagnosis not present

## 2021-01-06 DIAGNOSIS — Z79899 Other long term (current) drug therapy: Secondary | ICD-10-CM | POA: Diagnosis not present

## 2021-01-06 DIAGNOSIS — I5022 Chronic systolic (congestive) heart failure: Secondary | ICD-10-CM | POA: Diagnosis not present

## 2021-01-06 DIAGNOSIS — I25110 Atherosclerotic heart disease of native coronary artery with unstable angina pectoris: Secondary | ICD-10-CM | POA: Diagnosis not present

## 2021-01-06 DIAGNOSIS — I4891 Unspecified atrial fibrillation: Secondary | ICD-10-CM | POA: Diagnosis not present

## 2021-01-06 DIAGNOSIS — Z95811 Presence of heart assist device: Secondary | ICD-10-CM | POA: Diagnosis not present

## 2021-01-06 DIAGNOSIS — F329 Major depressive disorder, single episode, unspecified: Secondary | ICD-10-CM | POA: Diagnosis not present

## 2021-01-06 DIAGNOSIS — Z7901 Long term (current) use of anticoagulants: Secondary | ICD-10-CM | POA: Diagnosis not present

## 2021-01-06 DIAGNOSIS — G4733 Obstructive sleep apnea (adult) (pediatric): Secondary | ICD-10-CM | POA: Diagnosis not present

## 2021-01-06 DIAGNOSIS — Z95810 Presence of automatic (implantable) cardiac defibrillator: Secondary | ICD-10-CM | POA: Diagnosis not present

## 2021-01-06 DIAGNOSIS — R0602 Shortness of breath: Secondary | ICD-10-CM | POA: Diagnosis not present

## 2021-01-06 DIAGNOSIS — N400 Enlarged prostate without lower urinary tract symptoms: Secondary | ICD-10-CM | POA: Diagnosis not present

## 2021-01-06 DIAGNOSIS — Z955 Presence of coronary angioplasty implant and graft: Secondary | ICD-10-CM | POA: Diagnosis not present

## 2021-01-06 DIAGNOSIS — Z794 Long term (current) use of insulin: Secondary | ICD-10-CM | POA: Diagnosis not present

## 2021-01-06 DIAGNOSIS — Z20822 Contact with and (suspected) exposure to covid-19: Secondary | ICD-10-CM | POA: Diagnosis not present

## 2021-01-06 DIAGNOSIS — E1151 Type 2 diabetes mellitus with diabetic peripheral angiopathy without gangrene: Secondary | ICD-10-CM | POA: Diagnosis not present

## 2021-01-06 DIAGNOSIS — Z87891 Personal history of nicotine dependence: Secondary | ICD-10-CM | POA: Diagnosis not present

## 2021-01-06 DIAGNOSIS — I482 Chronic atrial fibrillation, unspecified: Secondary | ICD-10-CM | POA: Diagnosis not present

## 2021-01-06 DIAGNOSIS — I2510 Atherosclerotic heart disease of native coronary artery without angina pectoris: Secondary | ICD-10-CM | POA: Diagnosis not present

## 2021-01-06 DIAGNOSIS — Z951 Presence of aortocoronary bypass graft: Secondary | ICD-10-CM | POA: Diagnosis not present

## 2021-01-06 DIAGNOSIS — E1140 Type 2 diabetes mellitus with diabetic neuropathy, unspecified: Secondary | ICD-10-CM | POA: Diagnosis not present

## 2021-01-06 DIAGNOSIS — E119 Type 2 diabetes mellitus without complications: Secondary | ICD-10-CM | POA: Diagnosis not present

## 2021-01-06 DIAGNOSIS — Z950 Presence of cardiac pacemaker: Secondary | ICD-10-CM | POA: Diagnosis not present

## 2021-01-06 DIAGNOSIS — R112 Nausea with vomiting, unspecified: Secondary | ICD-10-CM | POA: Diagnosis not present

## 2021-01-06 DIAGNOSIS — R0789 Other chest pain: Secondary | ICD-10-CM | POA: Diagnosis not present

## 2021-01-06 DIAGNOSIS — I351 Nonrheumatic aortic (valve) insufficiency: Secondary | ICD-10-CM | POA: Diagnosis not present

## 2021-01-06 DIAGNOSIS — E785 Hyperlipidemia, unspecified: Secondary | ICD-10-CM | POA: Diagnosis not present

## 2021-01-07 DIAGNOSIS — R0789 Other chest pain: Secondary | ICD-10-CM | POA: Diagnosis not present

## 2021-01-07 DIAGNOSIS — Z95811 Presence of heart assist device: Secondary | ICD-10-CM | POA: Diagnosis not present

## 2021-01-07 DIAGNOSIS — R079 Chest pain, unspecified: Secondary | ICD-10-CM | POA: Diagnosis not present

## 2021-01-07 DIAGNOSIS — I5023 Acute on chronic systolic (congestive) heart failure: Secondary | ICD-10-CM | POA: Diagnosis not present

## 2021-01-23 DIAGNOSIS — I70219 Atherosclerosis of native arteries of extremities with intermittent claudication, unspecified extremity: Secondary | ICD-10-CM | POA: Diagnosis not present

## 2021-01-23 DIAGNOSIS — M79605 Pain in left leg: Secondary | ICD-10-CM | POA: Diagnosis not present

## 2021-01-28 DIAGNOSIS — I25118 Atherosclerotic heart disease of native coronary artery with other forms of angina pectoris: Secondary | ICD-10-CM | POA: Diagnosis not present

## 2021-01-28 DIAGNOSIS — E1122 Type 2 diabetes mellitus with diabetic chronic kidney disease: Secondary | ICD-10-CM | POA: Diagnosis not present

## 2021-01-28 DIAGNOSIS — E1151 Type 2 diabetes mellitus with diabetic peripheral angiopathy without gangrene: Secondary | ICD-10-CM | POA: Diagnosis not present

## 2021-01-28 DIAGNOSIS — G4733 Obstructive sleep apnea (adult) (pediatric): Secondary | ICD-10-CM | POA: Diagnosis not present

## 2021-01-28 DIAGNOSIS — J9610 Chronic respiratory failure, unspecified whether with hypoxia or hypercapnia: Secondary | ICD-10-CM | POA: Diagnosis not present

## 2021-01-28 DIAGNOSIS — D6869 Other thrombophilia: Secondary | ICD-10-CM | POA: Diagnosis not present

## 2021-01-28 DIAGNOSIS — I5020 Unspecified systolic (congestive) heart failure: Secondary | ICD-10-CM | POA: Diagnosis not present

## 2021-01-28 DIAGNOSIS — E662 Morbid (severe) obesity with alveolar hypoventilation: Secondary | ICD-10-CM | POA: Diagnosis not present

## 2021-01-28 DIAGNOSIS — E261 Secondary hyperaldosteronism: Secondary | ICD-10-CM | POA: Diagnosis not present

## 2021-01-28 DIAGNOSIS — E1159 Type 2 diabetes mellitus with other circulatory complications: Secondary | ICD-10-CM | POA: Diagnosis not present

## 2021-01-28 DIAGNOSIS — C61 Malignant neoplasm of prostate: Secondary | ICD-10-CM | POA: Diagnosis not present

## 2021-01-28 DIAGNOSIS — J449 Chronic obstructive pulmonary disease, unspecified: Secondary | ICD-10-CM | POA: Diagnosis not present

## 2021-02-01 DIAGNOSIS — Z7901 Long term (current) use of anticoagulants: Secondary | ICD-10-CM | POA: Diagnosis not present

## 2021-02-03 DIAGNOSIS — I255 Ischemic cardiomyopathy: Secondary | ICD-10-CM | POA: Diagnosis not present

## 2021-02-03 DIAGNOSIS — N183 Chronic kidney disease, stage 3 unspecified: Secondary | ICD-10-CM | POA: Diagnosis not present

## 2021-02-03 DIAGNOSIS — Z9861 Coronary angioplasty status: Secondary | ICD-10-CM | POA: Diagnosis not present

## 2021-02-03 DIAGNOSIS — N1832 Chronic kidney disease, stage 3b: Secondary | ICD-10-CM | POA: Diagnosis not present

## 2021-02-03 DIAGNOSIS — Z881 Allergy status to other antibiotic agents status: Secondary | ICD-10-CM | POA: Diagnosis not present

## 2021-02-03 DIAGNOSIS — R000 Tachycardia, unspecified: Secondary | ICD-10-CM | POA: Diagnosis not present

## 2021-02-03 DIAGNOSIS — Z6835 Body mass index (BMI) 35.0-35.9, adult: Secondary | ICD-10-CM | POA: Diagnosis not present

## 2021-02-03 DIAGNOSIS — N400 Enlarged prostate without lower urinary tract symptoms: Secondary | ICD-10-CM | POA: Diagnosis not present

## 2021-02-03 DIAGNOSIS — F329 Major depressive disorder, single episode, unspecified: Secondary | ICD-10-CM | POA: Diagnosis not present

## 2021-02-03 DIAGNOSIS — Z8546 Personal history of malignant neoplasm of prostate: Secondary | ICD-10-CM | POA: Diagnosis not present

## 2021-02-03 DIAGNOSIS — I4891 Unspecified atrial fibrillation: Secondary | ICD-10-CM | POA: Diagnosis not present

## 2021-02-03 DIAGNOSIS — I2510 Atherosclerotic heart disease of native coronary artery without angina pectoris: Secondary | ICD-10-CM | POA: Diagnosis not present

## 2021-02-03 DIAGNOSIS — R918 Other nonspecific abnormal finding of lung field: Secondary | ICD-10-CM | POA: Diagnosis not present

## 2021-02-03 DIAGNOSIS — E1142 Type 2 diabetes mellitus with diabetic polyneuropathy: Secondary | ICD-10-CM | POA: Diagnosis not present

## 2021-02-03 DIAGNOSIS — R0602 Shortness of breath: Secondary | ICD-10-CM | POA: Diagnosis not present

## 2021-02-03 DIAGNOSIS — Z951 Presence of aortocoronary bypass graft: Secondary | ICD-10-CM | POA: Diagnosis not present

## 2021-02-03 DIAGNOSIS — I5023 Acute on chronic systolic (congestive) heart failure: Secondary | ICD-10-CM | POA: Diagnosis not present

## 2021-02-03 DIAGNOSIS — I25810 Atherosclerosis of coronary artery bypass graft(s) without angina pectoris: Secondary | ICD-10-CM | POA: Diagnosis not present

## 2021-02-03 DIAGNOSIS — Z95811 Presence of heart assist device: Secondary | ICD-10-CM | POA: Diagnosis not present

## 2021-02-03 DIAGNOSIS — I10 Essential (primary) hypertension: Secondary | ICD-10-CM | POA: Diagnosis not present

## 2021-02-03 DIAGNOSIS — Z87891 Personal history of nicotine dependence: Secondary | ICD-10-CM | POA: Diagnosis not present

## 2021-02-03 DIAGNOSIS — I4901 Ventricular fibrillation: Secondary | ICD-10-CM | POA: Diagnosis not present

## 2021-02-03 DIAGNOSIS — E1140 Type 2 diabetes mellitus with diabetic neuropathy, unspecified: Secondary | ICD-10-CM | POA: Diagnosis not present

## 2021-02-03 DIAGNOSIS — E785 Hyperlipidemia, unspecified: Secondary | ICD-10-CM | POA: Diagnosis not present

## 2021-02-03 DIAGNOSIS — Z794 Long term (current) use of insulin: Secondary | ICD-10-CM | POA: Diagnosis not present

## 2021-02-03 DIAGNOSIS — G4733 Obstructive sleep apnea (adult) (pediatric): Secondary | ICD-10-CM | POA: Diagnosis not present

## 2021-02-03 DIAGNOSIS — Z20822 Contact with and (suspected) exposure to covid-19: Secondary | ICD-10-CM | POA: Diagnosis not present

## 2021-02-03 DIAGNOSIS — Z4502 Encounter for adjustment and management of automatic implantable cardiac defibrillator: Secondary | ICD-10-CM | POA: Diagnosis not present

## 2021-02-03 DIAGNOSIS — Z7901 Long term (current) use of anticoagulants: Secondary | ICD-10-CM | POA: Diagnosis not present

## 2021-02-03 DIAGNOSIS — I5022 Chronic systolic (congestive) heart failure: Secondary | ICD-10-CM | POA: Diagnosis not present

## 2021-02-03 DIAGNOSIS — I5089 Other heart failure: Secondary | ICD-10-CM | POA: Diagnosis not present

## 2021-02-03 DIAGNOSIS — Z95810 Presence of automatic (implantable) cardiac defibrillator: Secondary | ICD-10-CM | POA: Diagnosis not present

## 2021-02-03 DIAGNOSIS — R0989 Other specified symptoms and signs involving the circulatory and respiratory systems: Secondary | ICD-10-CM | POA: Diagnosis not present

## 2021-02-03 DIAGNOSIS — I130 Hypertensive heart and chronic kidney disease with heart failure and stage 1 through stage 4 chronic kidney disease, or unspecified chronic kidney disease: Secondary | ICD-10-CM | POA: Diagnosis not present

## 2021-02-03 DIAGNOSIS — R001 Bradycardia, unspecified: Secondary | ICD-10-CM | POA: Diagnosis not present

## 2021-02-03 DIAGNOSIS — K219 Gastro-esophageal reflux disease without esophagitis: Secondary | ICD-10-CM | POA: Diagnosis not present

## 2021-02-03 DIAGNOSIS — E1122 Type 2 diabetes mellitus with diabetic chronic kidney disease: Secondary | ICD-10-CM | POA: Diagnosis not present

## 2021-02-03 DIAGNOSIS — I472 Ventricular tachycardia: Secondary | ICD-10-CM | POA: Diagnosis not present

## 2021-02-04 DIAGNOSIS — Z95811 Presence of heart assist device: Secondary | ICD-10-CM | POA: Diagnosis not present

## 2021-02-04 DIAGNOSIS — Z4502 Encounter for adjustment and management of automatic implantable cardiac defibrillator: Secondary | ICD-10-CM | POA: Diagnosis not present

## 2021-02-04 DIAGNOSIS — I255 Ischemic cardiomyopathy: Secondary | ICD-10-CM | POA: Diagnosis not present

## 2021-02-04 DIAGNOSIS — I472 Ventricular tachycardia: Secondary | ICD-10-CM | POA: Diagnosis not present

## 2021-02-05 DIAGNOSIS — I255 Ischemic cardiomyopathy: Secondary | ICD-10-CM | POA: Diagnosis not present

## 2021-02-05 DIAGNOSIS — I2510 Atherosclerotic heart disease of native coronary artery without angina pectoris: Secondary | ICD-10-CM | POA: Diagnosis not present

## 2021-02-05 DIAGNOSIS — Z95811 Presence of heart assist device: Secondary | ICD-10-CM | POA: Diagnosis not present

## 2021-02-05 DIAGNOSIS — Z794 Long term (current) use of insulin: Secondary | ICD-10-CM | POA: Diagnosis not present

## 2021-02-05 DIAGNOSIS — Z4502 Encounter for adjustment and management of automatic implantable cardiac defibrillator: Secondary | ICD-10-CM | POA: Diagnosis not present

## 2021-02-05 DIAGNOSIS — I4901 Ventricular fibrillation: Secondary | ICD-10-CM | POA: Diagnosis not present

## 2021-02-05 DIAGNOSIS — Z951 Presence of aortocoronary bypass graft: Secondary | ICD-10-CM | POA: Diagnosis not present

## 2021-02-05 DIAGNOSIS — I5023 Acute on chronic systolic (congestive) heart failure: Secondary | ICD-10-CM | POA: Diagnosis not present

## 2021-02-05 DIAGNOSIS — E1140 Type 2 diabetes mellitus with diabetic neuropathy, unspecified: Secondary | ICD-10-CM | POA: Diagnosis not present

## 2021-02-05 DIAGNOSIS — Z95810 Presence of automatic (implantable) cardiac defibrillator: Secondary | ICD-10-CM | POA: Diagnosis not present

## 2021-02-05 DIAGNOSIS — I472 Ventricular tachycardia: Secondary | ICD-10-CM | POA: Diagnosis not present

## 2021-02-05 DIAGNOSIS — I5022 Chronic systolic (congestive) heart failure: Secondary | ICD-10-CM | POA: Diagnosis not present

## 2021-02-05 DIAGNOSIS — I10 Essential (primary) hypertension: Secondary | ICD-10-CM | POA: Diagnosis not present

## 2021-02-05 DIAGNOSIS — Z7901 Long term (current) use of anticoagulants: Secondary | ICD-10-CM | POA: Diagnosis not present

## 2021-02-06 DIAGNOSIS — Z4502 Encounter for adjustment and management of automatic implantable cardiac defibrillator: Secondary | ICD-10-CM | POA: Diagnosis not present

## 2021-02-06 DIAGNOSIS — I5023 Acute on chronic systolic (congestive) heart failure: Secondary | ICD-10-CM | POA: Diagnosis not present

## 2021-02-06 DIAGNOSIS — I4901 Ventricular fibrillation: Secondary | ICD-10-CM | POA: Diagnosis not present

## 2021-02-06 DIAGNOSIS — I255 Ischemic cardiomyopathy: Secondary | ICD-10-CM | POA: Diagnosis not present

## 2021-02-06 DIAGNOSIS — E1140 Type 2 diabetes mellitus with diabetic neuropathy, unspecified: Secondary | ICD-10-CM | POA: Diagnosis not present

## 2021-02-06 DIAGNOSIS — I2510 Atherosclerotic heart disease of native coronary artery without angina pectoris: Secondary | ICD-10-CM | POA: Diagnosis not present

## 2021-02-06 DIAGNOSIS — I472 Ventricular tachycardia: Secondary | ICD-10-CM | POA: Diagnosis not present

## 2021-02-06 DIAGNOSIS — I10 Essential (primary) hypertension: Secondary | ICD-10-CM | POA: Diagnosis not present

## 2021-02-06 DIAGNOSIS — Z794 Long term (current) use of insulin: Secondary | ICD-10-CM | POA: Diagnosis not present

## 2021-02-06 DIAGNOSIS — Z95811 Presence of heart assist device: Secondary | ICD-10-CM | POA: Diagnosis not present

## 2021-02-06 DIAGNOSIS — Z951 Presence of aortocoronary bypass graft: Secondary | ICD-10-CM | POA: Diagnosis not present

## 2021-02-07 DIAGNOSIS — I5023 Acute on chronic systolic (congestive) heart failure: Secondary | ICD-10-CM | POA: Diagnosis not present

## 2021-02-07 DIAGNOSIS — E1140 Type 2 diabetes mellitus with diabetic neuropathy, unspecified: Secondary | ICD-10-CM | POA: Diagnosis not present

## 2021-02-07 DIAGNOSIS — Z794 Long term (current) use of insulin: Secondary | ICD-10-CM | POA: Diagnosis not present

## 2021-02-07 DIAGNOSIS — Z95810 Presence of automatic (implantable) cardiac defibrillator: Secondary | ICD-10-CM | POA: Diagnosis not present

## 2021-02-07 DIAGNOSIS — I472 Ventricular tachycardia: Secondary | ICD-10-CM | POA: Diagnosis not present

## 2021-02-07 DIAGNOSIS — Z95811 Presence of heart assist device: Secondary | ICD-10-CM | POA: Diagnosis not present

## 2021-02-07 DIAGNOSIS — I255 Ischemic cardiomyopathy: Secondary | ICD-10-CM | POA: Diagnosis not present

## 2021-02-07 DIAGNOSIS — Z7901 Long term (current) use of anticoagulants: Secondary | ICD-10-CM | POA: Diagnosis not present

## 2021-02-07 DIAGNOSIS — I10 Essential (primary) hypertension: Secondary | ICD-10-CM | POA: Diagnosis not present

## 2021-02-07 DIAGNOSIS — I2510 Atherosclerotic heart disease of native coronary artery without angina pectoris: Secondary | ICD-10-CM | POA: Diagnosis not present

## 2021-02-07 DIAGNOSIS — I4901 Ventricular fibrillation: Secondary | ICD-10-CM | POA: Diagnosis not present

## 2021-02-07 DIAGNOSIS — Z4502 Encounter for adjustment and management of automatic implantable cardiac defibrillator: Secondary | ICD-10-CM | POA: Diagnosis not present

## 2021-02-07 DIAGNOSIS — Z951 Presence of aortocoronary bypass graft: Secondary | ICD-10-CM | POA: Diagnosis not present

## 2021-02-07 DIAGNOSIS — I5022 Chronic systolic (congestive) heart failure: Secondary | ICD-10-CM | POA: Diagnosis not present

## 2021-02-10 DIAGNOSIS — E1122 Type 2 diabetes mellitus with diabetic chronic kidney disease: Secondary | ICD-10-CM | POA: Diagnosis not present

## 2021-02-10 DIAGNOSIS — Z794 Long term (current) use of insulin: Secondary | ICD-10-CM | POA: Diagnosis not present

## 2021-02-10 DIAGNOSIS — N1832 Chronic kidney disease, stage 3b: Secondary | ICD-10-CM | POA: Diagnosis not present

## 2021-02-10 DIAGNOSIS — E1159 Type 2 diabetes mellitus with other circulatory complications: Secondary | ICD-10-CM | POA: Diagnosis not present

## 2021-03-03 DIAGNOSIS — E261 Secondary hyperaldosteronism: Secondary | ICD-10-CM | POA: Diagnosis not present

## 2021-03-03 DIAGNOSIS — Z515 Encounter for palliative care: Secondary | ICD-10-CM | POA: Diagnosis not present

## 2021-03-03 DIAGNOSIS — I5020 Unspecified systolic (congestive) heart failure: Secondary | ICD-10-CM | POA: Diagnosis not present

## 2021-03-03 DIAGNOSIS — Z95811 Presence of heart assist device: Secondary | ICD-10-CM | POA: Diagnosis not present

## 2021-03-06 DIAGNOSIS — Z7901 Long term (current) use of anticoagulants: Secondary | ICD-10-CM | POA: Diagnosis not present

## 2021-03-10 DIAGNOSIS — Z23 Encounter for immunization: Secondary | ICD-10-CM | POA: Diagnosis not present

## 2021-03-10 DIAGNOSIS — I5022 Chronic systolic (congestive) heart failure: Secondary | ICD-10-CM | POA: Diagnosis not present

## 2021-03-10 DIAGNOSIS — Z79899 Other long term (current) drug therapy: Secondary | ICD-10-CM | POA: Diagnosis not present

## 2021-03-10 DIAGNOSIS — Z794 Long term (current) use of insulin: Secondary | ICD-10-CM | POA: Diagnosis not present

## 2021-03-10 DIAGNOSIS — E1140 Type 2 diabetes mellitus with diabetic neuropathy, unspecified: Secondary | ICD-10-CM | POA: Diagnosis not present

## 2021-03-10 DIAGNOSIS — B952 Enterococcus as the cause of diseases classified elsewhere: Secondary | ICD-10-CM | POA: Diagnosis not present

## 2021-03-10 DIAGNOSIS — Z792 Long term (current) use of antibiotics: Secondary | ICD-10-CM | POA: Diagnosis not present

## 2021-03-10 DIAGNOSIS — Z95811 Presence of heart assist device: Secondary | ICD-10-CM | POA: Diagnosis not present

## 2021-03-10 DIAGNOSIS — R9431 Abnormal electrocardiogram [ECG] [EKG]: Secondary | ICD-10-CM | POA: Diagnosis not present

## 2021-03-10 DIAGNOSIS — Z87891 Personal history of nicotine dependence: Secondary | ICD-10-CM | POA: Diagnosis not present

## 2021-03-10 DIAGNOSIS — T827XXA Infection and inflammatory reaction due to other cardiac and vascular devices, implants and grafts, initial encounter: Secondary | ICD-10-CM | POA: Diagnosis not present

## 2021-03-10 DIAGNOSIS — Z7901 Long term (current) use of anticoagulants: Secondary | ICD-10-CM | POA: Diagnosis not present

## 2021-03-10 DIAGNOSIS — D500 Iron deficiency anemia secondary to blood loss (chronic): Secondary | ICD-10-CM | POA: Diagnosis not present

## 2021-03-20 DIAGNOSIS — E119 Type 2 diabetes mellitus without complications: Secondary | ICD-10-CM | POA: Diagnosis not present

## 2021-03-30 DIAGNOSIS — Z95810 Presence of automatic (implantable) cardiac defibrillator: Secondary | ICD-10-CM | POA: Diagnosis not present

## 2021-03-30 DIAGNOSIS — I472 Ventricular tachycardia: Secondary | ICD-10-CM | POA: Diagnosis not present

## 2021-03-30 DIAGNOSIS — Z4502 Encounter for adjustment and management of automatic implantable cardiac defibrillator: Secondary | ICD-10-CM | POA: Diagnosis not present

## 2021-03-31 DIAGNOSIS — I25118 Atherosclerotic heart disease of native coronary artery with other forms of angina pectoris: Secondary | ICD-10-CM | POA: Diagnosis not present

## 2021-03-31 DIAGNOSIS — C61 Malignant neoplasm of prostate: Secondary | ICD-10-CM | POA: Diagnosis not present

## 2021-03-31 DIAGNOSIS — E261 Secondary hyperaldosteronism: Secondary | ICD-10-CM | POA: Diagnosis not present

## 2021-03-31 DIAGNOSIS — D692 Other nonthrombocytopenic purpura: Secondary | ICD-10-CM | POA: Diagnosis not present

## 2021-03-31 DIAGNOSIS — E1151 Type 2 diabetes mellitus with diabetic peripheral angiopathy without gangrene: Secondary | ICD-10-CM | POA: Diagnosis not present

## 2021-03-31 DIAGNOSIS — E1122 Type 2 diabetes mellitus with diabetic chronic kidney disease: Secondary | ICD-10-CM | POA: Diagnosis not present

## 2021-03-31 DIAGNOSIS — E662 Morbid (severe) obesity with alveolar hypoventilation: Secondary | ICD-10-CM | POA: Diagnosis not present

## 2021-03-31 DIAGNOSIS — J9610 Chronic respiratory failure, unspecified whether with hypoxia or hypercapnia: Secondary | ICD-10-CM | POA: Diagnosis not present

## 2021-03-31 DIAGNOSIS — E1159 Type 2 diabetes mellitus with other circulatory complications: Secondary | ICD-10-CM | POA: Diagnosis not present

## 2021-03-31 DIAGNOSIS — J449 Chronic obstructive pulmonary disease, unspecified: Secondary | ICD-10-CM | POA: Diagnosis not present

## 2021-03-31 DIAGNOSIS — D6869 Other thrombophilia: Secondary | ICD-10-CM | POA: Diagnosis not present

## 2021-03-31 DIAGNOSIS — I5020 Unspecified systolic (congestive) heart failure: Secondary | ICD-10-CM | POA: Diagnosis not present

## 2021-04-03 DIAGNOSIS — Z7901 Long term (current) use of anticoagulants: Secondary | ICD-10-CM | POA: Diagnosis not present

## 2021-04-20 DIAGNOSIS — Z95811 Presence of heart assist device: Secondary | ICD-10-CM | POA: Diagnosis not present

## 2021-04-20 DIAGNOSIS — I70212 Atherosclerosis of native arteries of extremities with intermittent claudication, left leg: Secondary | ICD-10-CM | POA: Diagnosis not present

## 2021-04-20 DIAGNOSIS — E119 Type 2 diabetes mellitus without complications: Secondary | ICD-10-CM | POA: Diagnosis not present

## 2021-04-20 DIAGNOSIS — I7092 Chronic total occlusion of artery of the extremities: Secondary | ICD-10-CM | POA: Diagnosis not present

## 2021-04-20 DIAGNOSIS — Z95810 Presence of automatic (implantable) cardiac defibrillator: Secondary | ICD-10-CM | POA: Diagnosis not present

## 2021-04-20 DIAGNOSIS — Z87891 Personal history of nicotine dependence: Secondary | ICD-10-CM | POA: Diagnosis not present

## 2021-04-20 DIAGNOSIS — I429 Cardiomyopathy, unspecified: Secondary | ICD-10-CM | POA: Diagnosis not present

## 2021-04-20 DIAGNOSIS — M79605 Pain in left leg: Secondary | ICD-10-CM | POA: Diagnosis not present

## 2021-04-20 DIAGNOSIS — E1159 Type 2 diabetes mellitus with other circulatory complications: Secondary | ICD-10-CM | POA: Diagnosis not present

## 2021-04-20 DIAGNOSIS — Z87898 Personal history of other specified conditions: Secondary | ICD-10-CM | POA: Diagnosis not present

## 2021-04-20 DIAGNOSIS — I509 Heart failure, unspecified: Secondary | ICD-10-CM | POA: Diagnosis not present

## 2021-04-20 DIAGNOSIS — I255 Ischemic cardiomyopathy: Secondary | ICD-10-CM | POA: Diagnosis not present

## 2021-04-20 DIAGNOSIS — I2510 Atherosclerotic heart disease of native coronary artery without angina pectoris: Secondary | ICD-10-CM | POA: Diagnosis not present

## 2021-04-20 DIAGNOSIS — Z951 Presence of aortocoronary bypass graft: Secondary | ICD-10-CM | POA: Diagnosis not present

## 2021-04-21 DIAGNOSIS — Z95810 Presence of automatic (implantable) cardiac defibrillator: Secondary | ICD-10-CM | POA: Diagnosis not present

## 2021-04-21 DIAGNOSIS — Z4502 Encounter for adjustment and management of automatic implantable cardiac defibrillator: Secondary | ICD-10-CM | POA: Diagnosis not present

## 2021-04-21 DIAGNOSIS — I472 Ventricular tachycardia: Secondary | ICD-10-CM | POA: Diagnosis not present

## 2021-04-21 DIAGNOSIS — Z95811 Presence of heart assist device: Secondary | ICD-10-CM | POA: Diagnosis not present

## 2021-04-22 DIAGNOSIS — Z95811 Presence of heart assist device: Secondary | ICD-10-CM | POA: Diagnosis not present

## 2021-04-22 DIAGNOSIS — I4901 Ventricular fibrillation: Secondary | ICD-10-CM | POA: Diagnosis not present

## 2021-04-22 DIAGNOSIS — I472 Ventricular tachycardia: Secondary | ICD-10-CM | POA: Diagnosis not present

## 2021-04-29 DIAGNOSIS — I4901 Ventricular fibrillation: Secondary | ICD-10-CM | POA: Diagnosis not present

## 2021-04-29 DIAGNOSIS — Z951 Presence of aortocoronary bypass graft: Secondary | ICD-10-CM | POA: Diagnosis not present

## 2021-04-29 DIAGNOSIS — I739 Peripheral vascular disease, unspecified: Secondary | ICD-10-CM | POA: Diagnosis not present

## 2021-04-29 DIAGNOSIS — Z01818 Encounter for other preprocedural examination: Secondary | ICD-10-CM | POA: Diagnosis not present

## 2021-04-29 DIAGNOSIS — I2510 Atherosclerotic heart disease of native coronary artery without angina pectoris: Secondary | ICD-10-CM | POA: Diagnosis not present

## 2021-04-29 DIAGNOSIS — I255 Ischemic cardiomyopathy: Secondary | ICD-10-CM | POA: Diagnosis not present

## 2021-04-29 DIAGNOSIS — I5022 Chronic systolic (congestive) heart failure: Secondary | ICD-10-CM | POA: Diagnosis not present

## 2021-04-29 DIAGNOSIS — I472 Ventricular tachycardia: Secondary | ICD-10-CM | POA: Diagnosis not present

## 2021-04-30 DIAGNOSIS — I2510 Atherosclerotic heart disease of native coronary artery without angina pectoris: Secondary | ICD-10-CM | POA: Diagnosis not present

## 2021-04-30 DIAGNOSIS — R0602 Shortness of breath: Secondary | ICD-10-CM | POA: Diagnosis not present

## 2021-04-30 DIAGNOSIS — I4901 Ventricular fibrillation: Secondary | ICD-10-CM | POA: Diagnosis not present

## 2021-04-30 DIAGNOSIS — I472 Ventricular tachycardia: Secondary | ICD-10-CM | POA: Diagnosis not present

## 2021-04-30 DIAGNOSIS — Z794 Long term (current) use of insulin: Secondary | ICD-10-CM | POA: Diagnosis not present

## 2021-04-30 DIAGNOSIS — Z95811 Presence of heart assist device: Secondary | ICD-10-CM | POA: Diagnosis not present

## 2021-04-30 DIAGNOSIS — Z4502 Encounter for adjustment and management of automatic implantable cardiac defibrillator: Secondary | ICD-10-CM | POA: Diagnosis not present

## 2021-04-30 DIAGNOSIS — K219 Gastro-esophageal reflux disease without esophagitis: Secondary | ICD-10-CM | POA: Diagnosis not present

## 2021-04-30 DIAGNOSIS — E1140 Type 2 diabetes mellitus with diabetic neuropathy, unspecified: Secondary | ICD-10-CM | POA: Diagnosis not present

## 2021-04-30 DIAGNOSIS — E349 Endocrine disorder, unspecified: Secondary | ICD-10-CM | POA: Insufficient documentation

## 2021-05-01 DIAGNOSIS — E1140 Type 2 diabetes mellitus with diabetic neuropathy, unspecified: Secondary | ICD-10-CM | POA: Diagnosis not present

## 2021-05-01 DIAGNOSIS — K219 Gastro-esophageal reflux disease without esophagitis: Secondary | ICD-10-CM | POA: Diagnosis not present

## 2021-05-01 DIAGNOSIS — I472 Ventricular tachycardia: Secondary | ICD-10-CM | POA: Diagnosis not present

## 2021-05-01 DIAGNOSIS — I2510 Atherosclerotic heart disease of native coronary artery without angina pectoris: Secondary | ICD-10-CM | POA: Diagnosis not present

## 2021-05-01 DIAGNOSIS — Z794 Long term (current) use of insulin: Secondary | ICD-10-CM | POA: Diagnosis not present

## 2021-05-01 DIAGNOSIS — Z95811 Presence of heart assist device: Secondary | ICD-10-CM | POA: Diagnosis not present

## 2021-05-02 DIAGNOSIS — I5022 Chronic systolic (congestive) heart failure: Secondary | ICD-10-CM | POA: Diagnosis not present

## 2021-05-02 DIAGNOSIS — D62 Acute posthemorrhagic anemia: Secondary | ICD-10-CM | POA: Diagnosis not present

## 2021-05-02 DIAGNOSIS — K921 Melena: Secondary | ICD-10-CM | POA: Diagnosis not present

## 2021-05-02 DIAGNOSIS — U071 COVID-19: Secondary | ICD-10-CM | POA: Diagnosis not present

## 2021-05-02 DIAGNOSIS — Z95811 Presence of heart assist device: Secondary | ICD-10-CM | POA: Diagnosis not present

## 2021-05-03 DIAGNOSIS — R0602 Shortness of breath: Secondary | ICD-10-CM | POA: Diagnosis not present

## 2021-05-03 DIAGNOSIS — I5022 Chronic systolic (congestive) heart failure: Secondary | ICD-10-CM | POA: Diagnosis not present

## 2021-05-03 DIAGNOSIS — I70219 Atherosclerosis of native arteries of extremities with intermittent claudication, unspecified extremity: Secondary | ICD-10-CM | POA: Diagnosis not present

## 2021-05-03 DIAGNOSIS — U071 COVID-19: Secondary | ICD-10-CM | POA: Diagnosis not present

## 2021-05-03 DIAGNOSIS — E8770 Fluid overload, unspecified: Secondary | ICD-10-CM | POA: Diagnosis not present

## 2021-05-03 DIAGNOSIS — Z7901 Long term (current) use of anticoagulants: Secondary | ICD-10-CM | POA: Diagnosis not present

## 2021-05-03 DIAGNOSIS — D62 Acute posthemorrhagic anemia: Secondary | ICD-10-CM | POA: Diagnosis not present

## 2021-05-03 DIAGNOSIS — K921 Melena: Secondary | ICD-10-CM | POA: Diagnosis not present

## 2021-05-03 DIAGNOSIS — Z95811 Presence of heart assist device: Secondary | ICD-10-CM | POA: Diagnosis not present

## 2021-05-04 DIAGNOSIS — E1165 Type 2 diabetes mellitus with hyperglycemia: Secondary | ICD-10-CM | POA: Diagnosis not present

## 2021-05-04 DIAGNOSIS — R7401 Elevation of levels of liver transaminase levels: Secondary | ICD-10-CM | POA: Insufficient documentation

## 2021-05-04 DIAGNOSIS — J9601 Acute respiratory failure with hypoxia: Secondary | ICD-10-CM | POA: Diagnosis not present

## 2021-05-04 DIAGNOSIS — U071 COVID-19: Secondary | ICD-10-CM | POA: Diagnosis not present

## 2021-05-04 DIAGNOSIS — R0602 Shortness of breath: Secondary | ICD-10-CM | POA: Diagnosis not present

## 2021-05-04 DIAGNOSIS — Z792 Long term (current) use of antibiotics: Secondary | ICD-10-CM | POA: Diagnosis not present

## 2021-05-04 DIAGNOSIS — I2510 Atherosclerotic heart disease of native coronary artery without angina pectoris: Secondary | ICD-10-CM | POA: Diagnosis not present

## 2021-05-04 DIAGNOSIS — Z95811 Presence of heart assist device: Secondary | ICD-10-CM | POA: Diagnosis not present

## 2021-05-12 ENCOUNTER — Encounter: Payer: Self-pay | Admitting: Adult Health Nurse Practitioner

## 2021-05-12 ENCOUNTER — Other Ambulatory Visit: Payer: Self-pay

## 2021-05-12 ENCOUNTER — Other Ambulatory Visit: Payer: PPO | Admitting: Adult Health Nurse Practitioner

## 2021-05-12 DIAGNOSIS — I739 Peripheral vascular disease, unspecified: Secondary | ICD-10-CM

## 2021-05-12 DIAGNOSIS — Z515 Encounter for palliative care: Secondary | ICD-10-CM | POA: Diagnosis not present

## 2021-05-12 DIAGNOSIS — I5022 Chronic systolic (congestive) heart failure: Secondary | ICD-10-CM | POA: Diagnosis not present

## 2021-05-12 NOTE — Progress Notes (Signed)
[facility] Consult Note Telephone: [phone]  Fax: [phone]    Date of encounter: 05/12/21 PATIENT NAME: [name] [place] [address]   [id number] (home)  DOB: Oct 23, 1946 MRN: [id number] PRIMARY CARE PROVIDER:    [name], MD,  [place] [address] [phone]  REFERRING PROVIDER:   [name], MD [place] [facility] [address] [place],  [place] [address] [phone]  RESPONSIBLE PARTY:    Contact Information    Name Relation Home Work [place] [phone]  [phone]   Shiro, Ellerman [phone]  [phone]   Gildardo, Tickner Daughter [phone]  [phone]        Palliative Care was asked to follow this patient by consultation request of  [name], MD to address advance care planning and complex medical decision making. This is a follow up visit.  Due to the COVID-19 crisis, this visit was done via telemedicine and it was initiated and consent by this patient and or family.                                   ASSESSMENT AND PLAN / RECOMMENDATIONS:   Advance Care Planning/Goals of Care: Goals include to maximize quality of life and symptom management.   CODE STATUS: full code  Symptom Management/Plan:  CHF/claudication: Patient does state that he is getting PT/OT set up in the home.  He does have telehealth appointment tomorrow with his cardiologist.  Did discuss that his cardiologist will probably want him to come in for further testing and evaluation given his symptoms.  Referral has been put in from last hospitalization for vascular surgery.  Continue follow-up and recommendations by cardiology.   Follow up Palliative Care Visit: Palliative care will continue to follow for complex medical decision making, advance care planning, and clarification of goals. Return 4 weeks  or prn. Encouraged to call with any questions or concerns  I spent 45 minutes providing this consultation. More than 50% of the time in this consultation was spent in counseling and care coordination.   PPS: 60%  HOSPICE ELIGIBILITY/DIAGNOSIS: TBD  Chief Complaint: follow up palliative visit  HISTORY OF PRESENT ILLNESS:  [name] is a 75 y.o. year old male  with chronic systolic heart failure,cardiomyopathy, myocardial infarction, hypertension, hypercholesterolemia, copd,diabetes,anemia, prostate cancer, depression,GERD, gout,macular degeneration, obstructive sleep apnea, on CPAP.  Patient had hospitalization 5/25 through 05/10/2021 for COVID infection.  Patient did have hospitalization 3/1 through 02/07/2021 for defibrillator discharge and had fitting and adjustment of his automatic implantable cardio defibrillator.  Patient states that since then he has been having increased shortness of breath and cannot walk more than 10 to 15 feet without getting too short of breath and having to stop and rest and having left leg pain.  States that prior to the COVID infection he was able to walk 25 to 30 yards without getting short of breath or having left leg pain.  Patient does state that he is having episodes especially when he gets up in the morning of feeling like he is going to pass out.  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  Referral to vascular surgery has been placed.  Patient does have telehealth appointment tomorrow with his [facility] cardiologist for his LVAD.  Patient denies  cough, pain, chest pain, fever, chest pain.  History obtained from review of EMR, discussion with primary team, and interview with family, facility staff/caregiver and/or [name].  I reviewed available labs, medications, imaging, studies and related documents from the EMR.  Records reviewed and summarized above.    Physical  Exam: Deferred due to telehealth   Thank you for the opportunity to participate in the care of [name].  The palliative care team will continue to follow. Please call our office at [phone] if we can be of additional assistance.   Merilee Wible Jenetta Downer, NP , DNP  This chart was dictated using voice recognition software. Despite best efforts to proofread, errors can occur which can change the documentation meaning.   COVID-19 PATIENT SCREENING TOOL Asked and negative response unless otherwise noted:   Have you had symptoms of covid, tested positive or been in contact with someone with symptoms/positive test in the past 5-10 days? negative

## 2021-05-13 DIAGNOSIS — I5023 Acute on chronic systolic (congestive) heart failure: Secondary | ICD-10-CM | POA: Diagnosis not present

## 2021-05-14 DIAGNOSIS — K921 Melena: Secondary | ICD-10-CM | POA: Diagnosis not present

## 2021-05-14 DIAGNOSIS — R0602 Shortness of breath: Secondary | ICD-10-CM | POA: Diagnosis not present

## 2021-05-15 DIAGNOSIS — D500 Iron deficiency anemia secondary to blood loss (chronic): Secondary | ICD-10-CM | POA: Diagnosis not present

## 2021-05-15 DIAGNOSIS — K921 Melena: Secondary | ICD-10-CM | POA: Diagnosis not present

## 2021-05-15 DIAGNOSIS — D490 Neoplasm of unspecified behavior of digestive system: Secondary | ICD-10-CM | POA: Diagnosis not present

## 2021-05-15 DIAGNOSIS — D62 Acute posthemorrhagic anemia: Secondary | ICD-10-CM | POA: Diagnosis not present

## 2021-05-15 DIAGNOSIS — K2289 Other specified disease of esophagus: Secondary | ICD-10-CM | POA: Diagnosis not present

## 2021-05-15 DIAGNOSIS — K922 Gastrointestinal hemorrhage, unspecified: Secondary | ICD-10-CM | POA: Diagnosis not present

## 2021-05-15 DIAGNOSIS — Z9889 Other specified postprocedural states: Secondary | ICD-10-CM | POA: Diagnosis not present

## 2021-05-15 DIAGNOSIS — K31819 Angiodysplasia of stomach and duodenum without bleeding: Secondary | ICD-10-CM | POA: Diagnosis not present

## 2021-05-15 DIAGNOSIS — Z95811 Presence of heart assist device: Secondary | ICD-10-CM | POA: Diagnosis not present

## 2021-05-15 DIAGNOSIS — R195 Other fecal abnormalities: Secondary | ICD-10-CM | POA: Diagnosis not present

## 2021-05-15 DIAGNOSIS — R918 Other nonspecific abnormal finding of lung field: Secondary | ICD-10-CM | POA: Diagnosis not present

## 2021-05-15 DIAGNOSIS — R0602 Shortness of breath: Secondary | ICD-10-CM | POA: Diagnosis not present

## 2021-05-15 DIAGNOSIS — K5520 Angiodysplasia of colon without hemorrhage: Secondary | ICD-10-CM | POA: Diagnosis not present

## 2021-05-16 DIAGNOSIS — I255 Ischemic cardiomyopathy: Secondary | ICD-10-CM | POA: Diagnosis not present

## 2021-05-16 DIAGNOSIS — K922 Gastrointestinal hemorrhage, unspecified: Secondary | ICD-10-CM | POA: Diagnosis not present

## 2021-05-16 DIAGNOSIS — I5023 Acute on chronic systolic (congestive) heart failure: Secondary | ICD-10-CM | POA: Diagnosis not present

## 2021-05-16 DIAGNOSIS — Z95811 Presence of heart assist device: Secondary | ICD-10-CM | POA: Diagnosis not present

## 2021-05-16 DIAGNOSIS — D62 Acute posthemorrhagic anemia: Secondary | ICD-10-CM | POA: Diagnosis not present

## 2021-05-16 DIAGNOSIS — Z7901 Long term (current) use of anticoagulants: Secondary | ICD-10-CM | POA: Diagnosis not present

## 2021-05-17 DIAGNOSIS — I5023 Acute on chronic systolic (congestive) heart failure: Secondary | ICD-10-CM | POA: Diagnosis not present

## 2021-05-17 DIAGNOSIS — K922 Gastrointestinal hemorrhage, unspecified: Secondary | ICD-10-CM | POA: Diagnosis not present

## 2021-05-17 DIAGNOSIS — I255 Ischemic cardiomyopathy: Secondary | ICD-10-CM | POA: Diagnosis not present

## 2021-05-17 DIAGNOSIS — Z95811 Presence of heart assist device: Secondary | ICD-10-CM | POA: Diagnosis not present

## 2021-05-17 DIAGNOSIS — Z7901 Long term (current) use of anticoagulants: Secondary | ICD-10-CM | POA: Diagnosis not present

## 2021-05-17 DIAGNOSIS — D62 Acute posthemorrhagic anemia: Secondary | ICD-10-CM | POA: Diagnosis not present

## 2021-05-18 DIAGNOSIS — Z7901 Long term (current) use of anticoagulants: Secondary | ICD-10-CM | POA: Diagnosis not present

## 2021-05-18 DIAGNOSIS — D500 Iron deficiency anemia secondary to blood loss (chronic): Secondary | ICD-10-CM | POA: Diagnosis not present

## 2021-05-18 DIAGNOSIS — I2510 Atherosclerotic heart disease of native coronary artery without angina pectoris: Secondary | ICD-10-CM | POA: Diagnosis not present

## 2021-05-18 DIAGNOSIS — K922 Gastrointestinal hemorrhage, unspecified: Secondary | ICD-10-CM | POA: Diagnosis not present

## 2021-05-18 DIAGNOSIS — Z95811 Presence of heart assist device: Secondary | ICD-10-CM | POA: Diagnosis not present

## 2021-05-18 DIAGNOSIS — I5023 Acute on chronic systolic (congestive) heart failure: Secondary | ICD-10-CM | POA: Diagnosis not present

## 2021-05-18 DIAGNOSIS — D62 Acute posthemorrhagic anemia: Secondary | ICD-10-CM | POA: Diagnosis not present

## 2021-05-18 DIAGNOSIS — I519 Heart disease, unspecified: Secondary | ICD-10-CM | POA: Diagnosis not present

## 2021-05-18 DIAGNOSIS — I255 Ischemic cardiomyopathy: Secondary | ICD-10-CM | POA: Diagnosis not present

## 2021-05-18 DIAGNOSIS — I10 Essential (primary) hypertension: Secondary | ICD-10-CM | POA: Diagnosis not present

## 2021-05-18 DIAGNOSIS — Z8616 Personal history of COVID-19: Secondary | ICD-10-CM | POA: Diagnosis not present

## 2021-05-18 DIAGNOSIS — R195 Other fecal abnormalities: Secondary | ICD-10-CM | POA: Diagnosis not present

## 2021-05-20 DIAGNOSIS — I5020 Unspecified systolic (congestive) heart failure: Secondary | ICD-10-CM | POA: Diagnosis not present

## 2021-05-20 DIAGNOSIS — J449 Chronic obstructive pulmonary disease, unspecified: Secondary | ICD-10-CM | POA: Diagnosis not present

## 2021-05-20 DIAGNOSIS — C61 Malignant neoplasm of prostate: Secondary | ICD-10-CM | POA: Diagnosis not present

## 2021-05-20 DIAGNOSIS — I25118 Atherosclerotic heart disease of native coronary artery with other forms of angina pectoris: Secondary | ICD-10-CM | POA: Diagnosis not present

## 2021-05-20 DIAGNOSIS — E261 Secondary hyperaldosteronism: Secondary | ICD-10-CM | POA: Diagnosis not present

## 2021-05-20 DIAGNOSIS — J9610 Chronic respiratory failure, unspecified whether with hypoxia or hypercapnia: Secondary | ICD-10-CM | POA: Diagnosis not present

## 2021-05-20 DIAGNOSIS — R531 Weakness: Secondary | ICD-10-CM | POA: Diagnosis not present

## 2021-05-20 DIAGNOSIS — G4733 Obstructive sleep apnea (adult) (pediatric): Secondary | ICD-10-CM | POA: Diagnosis not present

## 2021-05-20 DIAGNOSIS — D692 Other nonthrombocytopenic purpura: Secondary | ICD-10-CM | POA: Diagnosis not present

## 2021-05-20 DIAGNOSIS — E1159 Type 2 diabetes mellitus with other circulatory complications: Secondary | ICD-10-CM | POA: Diagnosis not present

## 2021-05-20 DIAGNOSIS — I252 Old myocardial infarction: Secondary | ICD-10-CM | POA: Diagnosis not present

## 2021-05-20 DIAGNOSIS — R42 Dizziness and giddiness: Secondary | ICD-10-CM | POA: Diagnosis not present

## 2021-05-21 DIAGNOSIS — Z7901 Long term (current) use of anticoagulants: Secondary | ICD-10-CM | POA: Diagnosis not present

## 2021-05-21 DIAGNOSIS — Z09 Encounter for follow-up examination after completed treatment for conditions other than malignant neoplasm: Secondary | ICD-10-CM | POA: Diagnosis not present

## 2021-05-21 DIAGNOSIS — D500 Iron deficiency anemia secondary to blood loss (chronic): Secondary | ICD-10-CM | POA: Diagnosis not present

## 2021-05-21 DIAGNOSIS — I5022 Chronic systolic (congestive) heart failure: Secondary | ICD-10-CM | POA: Diagnosis not present

## 2021-05-21 DIAGNOSIS — K922 Gastrointestinal hemorrhage, unspecified: Secondary | ICD-10-CM | POA: Diagnosis not present

## 2021-05-21 DIAGNOSIS — I2510 Atherosclerotic heart disease of native coronary artery without angina pectoris: Secondary | ICD-10-CM | POA: Diagnosis not present

## 2021-05-22 DIAGNOSIS — I472 Ventricular tachycardia: Secondary | ICD-10-CM | POA: Diagnosis not present

## 2021-05-22 DIAGNOSIS — I5022 Chronic systolic (congestive) heart failure: Secondary | ICD-10-CM | POA: Diagnosis not present

## 2021-05-22 DIAGNOSIS — I4901 Ventricular fibrillation: Secondary | ICD-10-CM | POA: Diagnosis not present

## 2021-05-22 DIAGNOSIS — I200 Unstable angina: Secondary | ICD-10-CM | POA: Diagnosis not present

## 2021-05-22 DIAGNOSIS — I10 Essential (primary) hypertension: Secondary | ICD-10-CM | POA: Diagnosis not present

## 2021-05-22 DIAGNOSIS — I5023 Acute on chronic systolic (congestive) heart failure: Secondary | ICD-10-CM | POA: Diagnosis not present

## 2021-05-22 DIAGNOSIS — Z7901 Long term (current) use of anticoagulants: Secondary | ICD-10-CM | POA: Diagnosis not present

## 2021-05-25 DIAGNOSIS — E1122 Type 2 diabetes mellitus with diabetic chronic kidney disease: Secondary | ICD-10-CM | POA: Diagnosis not present

## 2021-05-25 DIAGNOSIS — Z794 Long term (current) use of insulin: Secondary | ICD-10-CM | POA: Diagnosis not present

## 2021-05-25 DIAGNOSIS — N1832 Chronic kidney disease, stage 3b: Secondary | ICD-10-CM | POA: Diagnosis not present

## 2021-05-26 DIAGNOSIS — I130 Hypertensive heart and chronic kidney disease with heart failure and stage 1 through stage 4 chronic kidney disease, or unspecified chronic kidney disease: Secondary | ICD-10-CM | POA: Diagnosis not present

## 2021-05-26 DIAGNOSIS — D631 Anemia in chronic kidney disease: Secondary | ICD-10-CM | POA: Diagnosis not present

## 2021-05-26 DIAGNOSIS — E785 Hyperlipidemia, unspecified: Secondary | ICD-10-CM | POA: Diagnosis not present

## 2021-05-26 DIAGNOSIS — N183 Chronic kidney disease, stage 3 unspecified: Secondary | ICD-10-CM | POA: Diagnosis not present

## 2021-05-26 DIAGNOSIS — D500 Iron deficiency anemia secondary to blood loss (chronic): Secondary | ICD-10-CM | POA: Diagnosis not present

## 2021-05-26 DIAGNOSIS — I5022 Chronic systolic (congestive) heart failure: Secondary | ICD-10-CM | POA: Diagnosis not present

## 2021-05-26 DIAGNOSIS — E1122 Type 2 diabetes mellitus with diabetic chronic kidney disease: Secondary | ICD-10-CM | POA: Diagnosis not present

## 2021-05-28 ENCOUNTER — Other Ambulatory Visit: Payer: Self-pay

## 2021-05-28 ENCOUNTER — Ambulatory Visit
Admission: RE | Admit: 2021-05-28 | Discharge: 2021-05-28 | Disposition: A | Discharge status: Home / Self Care | Payer: PPO | Source: Ambulatory Visit | Attending: Family Medicine | Admitting: Family Medicine

## 2021-05-28 ENCOUNTER — Ambulatory Visit: Admission: RE | Admit: 2021-05-28 | Payer: PPO | Source: Ambulatory Visit | Admitting: *Deleted

## 2021-05-28 ENCOUNTER — Other Ambulatory Visit: Payer: Self-pay | Admitting: Family Medicine

## 2021-05-28 ENCOUNTER — Other Ambulatory Visit (HOSPITAL_COMMUNITY): Payer: Self-pay | Admitting: Family Medicine

## 2021-05-28 DIAGNOSIS — R42 Dizziness and giddiness: Secondary | ICD-10-CM | POA: Insufficient documentation

## 2021-05-28 DIAGNOSIS — R296 Repeated falls: Secondary | ICD-10-CM | POA: Diagnosis not present

## 2021-05-28 DIAGNOSIS — R29898 Other symptoms and signs involving the musculoskeletal system: Secondary | ICD-10-CM

## 2021-05-28 DIAGNOSIS — R413 Other amnesia: Secondary | ICD-10-CM | POA: Diagnosis not present

## 2021-05-29 DIAGNOSIS — N183 Chronic kidney disease, stage 3 unspecified: Secondary | ICD-10-CM | POA: Diagnosis not present

## 2021-05-29 DIAGNOSIS — R42 Dizziness and giddiness: Secondary | ICD-10-CM | POA: Diagnosis not present

## 2021-05-29 DIAGNOSIS — I5020 Unspecified systolic (congestive) heart failure: Secondary | ICD-10-CM | POA: Diagnosis not present

## 2021-05-29 DIAGNOSIS — E1159 Type 2 diabetes mellitus with other circulatory complications: Secondary | ICD-10-CM | POA: Diagnosis not present

## 2021-05-29 DIAGNOSIS — R55 Syncope and collapse: Secondary | ICD-10-CM | POA: Diagnosis not present

## 2021-05-29 DIAGNOSIS — E1151 Type 2 diabetes mellitus with diabetic peripheral angiopathy without gangrene: Secondary | ICD-10-CM | POA: Diagnosis not present

## 2021-05-29 DIAGNOSIS — D692 Other nonthrombocytopenic purpura: Secondary | ICD-10-CM | POA: Diagnosis not present

## 2021-05-29 DIAGNOSIS — R531 Weakness: Secondary | ICD-10-CM | POA: Diagnosis not present

## 2021-05-29 DIAGNOSIS — Z515 Encounter for palliative care: Secondary | ICD-10-CM | POA: Diagnosis not present

## 2021-05-29 DIAGNOSIS — E261 Secondary hyperaldosteronism: Secondary | ICD-10-CM | POA: Diagnosis not present

## 2021-05-29 DIAGNOSIS — Z7901 Long term (current) use of anticoagulants: Secondary | ICD-10-CM | POA: Diagnosis not present

## 2021-05-29 DIAGNOSIS — E1122 Type 2 diabetes mellitus with diabetic chronic kidney disease: Secondary | ICD-10-CM | POA: Diagnosis not present

## 2021-06-01 DIAGNOSIS — E1142 Type 2 diabetes mellitus with diabetic polyneuropathy: Secondary | ICD-10-CM | POA: Diagnosis not present

## 2021-06-01 DIAGNOSIS — I739 Peripheral vascular disease, unspecified: Secondary | ICD-10-CM | POA: Diagnosis not present

## 2021-06-01 DIAGNOSIS — E1159 Type 2 diabetes mellitus with other circulatory complications: Secondary | ICD-10-CM | POA: Diagnosis not present

## 2021-06-01 DIAGNOSIS — Z794 Long term (current) use of insulin: Secondary | ICD-10-CM | POA: Diagnosis not present

## 2021-06-01 DIAGNOSIS — E1122 Type 2 diabetes mellitus with diabetic chronic kidney disease: Secondary | ICD-10-CM | POA: Diagnosis not present

## 2021-06-01 DIAGNOSIS — N1832 Chronic kidney disease, stage 3b: Secondary | ICD-10-CM | POA: Diagnosis not present

## 2021-06-01 DIAGNOSIS — Z9289 Personal history of other medical treatment: Secondary | ICD-10-CM | POA: Diagnosis not present

## 2021-06-02 ENCOUNTER — Encounter: Payer: Self-pay | Admitting: Adult Health Nurse Practitioner

## 2021-06-02 ENCOUNTER — Other Ambulatory Visit: Payer: Self-pay

## 2021-06-02 ENCOUNTER — Other Ambulatory Visit: Payer: PPO | Admitting: Adult Health Nurse Practitioner

## 2021-06-02 VITALS — HR 81 | Wt 221.0 lb

## 2021-06-02 DIAGNOSIS — I5022 Chronic systolic (congestive) heart failure: Secondary | ICD-10-CM

## 2021-06-02 DIAGNOSIS — Z515 Encounter for palliative care: Secondary | ICD-10-CM | POA: Diagnosis not present

## 2021-06-02 DIAGNOSIS — I739 Peripheral vascular disease, unspecified: Secondary | ICD-10-CM | POA: Diagnosis not present

## 2021-06-02 NOTE — Progress Notes (Addendum)
Therapist, nutritional Palliative Care Consult Note Telephone: [phone]  Fax: [phone]    Date of encounter: 06/02/21 PATIENT NAME: [name] [address] [name] [address]   [id number] (home)  DOB: [birth year] MRN: [id number] PRIMARY CARE PROVIDER:    Marisue Ivan, MD,  [address] [facility] [name] [address] [phone]  REFERRING PROVIDER:   Marisue Ivan, MD [address] [facility] [place],  [name] [address] [phone]  RESPONSIBLE PARTY:    Contact Information     Name Relation Home Work [place] [phone]  [phone]   [place], Scioneaux [phone]  [phone]   Evo, Aderman Daughter [phone]  [phone]        I met face to face with patient and family in home. Palliative Care was asked to follow this patient by consultation request of  Marisue Ivan, MD to address advance care planning and complex medical decision making. This is a follow up visit.  Son and wife present during visit today                                   ASSESSMENT AND PLAN / RECOMMENDATIONS:   Advance Care Planning/Goals of Care: Goals include to maximize quality of life and symptom management.  CODE STATUS: full code  Symptom Management/Plan:  CHF/claudication: Patient is encouraged to take his as needed torsemide as he has had a 5 pound weight increase over the past week.  Patient is going to consult with vascular surgery for surgery options for claudication in left leg.  Patient is doing better after changes in his medications and working with PT.  Patient is still fragile with the blockages in his left leg and being an LVAD patient.  Continue follow-up and recommendations by his LVAD team. Son does have concerns that if his dad had to be hospitalized who would take care of his mother who has advancing dementia.  States that he has been talking  with a Child psychotherapist through [employer] and and have encouraged him to continue talking about this particular subject with the Child psychotherapist.   Follow up Palliative Care Visit: Palliative care will continue to follow for complex medical decision making, advance care planning, and clarification of goals. Patient being followed by [facility].  Will have nurse check ins every couple of months. Encouraged to call with any questions or concerns  I spent 45 minutes providing this consultation. More than 50% of the time in this consultation was spent in counseling and care coordination.  PPS: 60%  HOSPICE ELIGIBILITY/DIAGNOSIS: TBD  Chief Complaint: follow up palliative visit  HISTORY OF PRESENT ILLNESS:  [name] is a 75 y.o. year old male  with chronic systolic heart failure, cardiomyopathy, myocardial infarction, hypertension, hypercholesterolemia, copd, diabetes, anemia, prostate cancer, depression, GERD, gout, macular degeneration, obstructive sleep apnea, on CPAP .  Patient had hospitalization 6/9 through 05/18/2021 for CHF exacerbation and GI bleed.  Patient did undergo surgery requiring APC and clips to fix the GI bleed.  Patient is on Coumadin for his LVAD and his target INR has been adjusted to 1.5-2.5 due to his GI bleeding.  Patient did have a fall a week ago in which he became lightheaded prior to the fall.  Did have CT scan of head on 05/28/2021 with no acute findings.  4 days ago patient had his blood pressure medications reduced and  his torsemide is to be given as needed for weight gain of 3 pounds overnight or 5 pounds over a week.  Patient does state that this has helped with his lightheadedness.  Patient is working with physical therapy and feels like he is gaining strength in his legs again.  He does continue to have left leg pain when he walks due to claudication.  He has appointment with vascular surgery on June 12, 2021.  Patient has had a weight gain of 5 pounds over the past week.   His weight today is 221 pounds and his weight 7 days ago was 216 pounds.  Patient denies pain, headaches, N/V/D, constipation, dysuria, hematuria, hematochezia, melena.  History obtained from review of EMR and interview with family and [name].  I reviewed available labs, medications, imaging, studies and related documents from the EMR.  Records reviewed and summarized above.   Physical Exam:  Constitutional: NAD General: frail appearing, obese  EYES: anicteric sclera, lids intact, no discharge  ENMT: intact hearing, oral mucous membranes moist CV: hear LVAD, no LE edema Pulmonary: LCTA, no increased work of breathing, no cough Abdomen: normo-active BS + 4 quadrants, soft and non tender; may have some abdominal fluid build up as has gained 5 pounds from a week ago MSK:  moves all extremities, ambulatory with cane Skin: warm and dry, no rashes on visible skin; does have some scratches to right arm from a fall Neuro:  A and O x 3 Hem/lymph/immuno: no widespread bruising   Thank you for the opportunity to participate in the care of [name].  The palliative care team will continue to follow. Please call our office at [phone] if we can be of additional assistance.   Makaia Rappa Jenetta Downer, NP , DNP  This chart was dictated using voice recognition software.  Despite best efforts to proofread,  errors can occur which can change the documentation meaning.   COVID-19 PATIENT SCREENING TOOL Asked and negative response unless otherwise noted:   Have you had symptoms of covid, tested positive or been in contact with someone with symptoms/positive test in the past 5-10 days?

## 2021-06-09 DIAGNOSIS — I5022 Chronic systolic (congestive) heart failure: Secondary | ICD-10-CM | POA: Diagnosis not present

## 2021-06-09 DIAGNOSIS — Z95811 Presence of heart assist device: Secondary | ICD-10-CM | POA: Diagnosis not present

## 2021-06-10 DIAGNOSIS — R0602 Shortness of breath: Secondary | ICD-10-CM | POA: Diagnosis not present

## 2021-06-11 DIAGNOSIS — R0602 Shortness of breath: Secondary | ICD-10-CM | POA: Diagnosis not present

## 2021-06-12 DIAGNOSIS — R0602 Shortness of breath: Secondary | ICD-10-CM | POA: Diagnosis not present

## 2021-06-16 ENCOUNTER — Encounter: Payer: PPO | Attending: Family Medicine | Admitting: *Deleted

## 2021-06-16 ENCOUNTER — Encounter: Payer: Self-pay | Admitting: *Deleted

## 2021-06-16 ENCOUNTER — Other Ambulatory Visit: Payer: Self-pay

## 2021-06-16 VITALS — Ht 67.0 in | Wt 213.4 lb

## 2021-06-16 DIAGNOSIS — E1159 Type 2 diabetes mellitus with other circulatory complications: Secondary | ICD-10-CM | POA: Diagnosis not present

## 2021-06-16 DIAGNOSIS — I5022 Chronic systolic (congestive) heart failure: Secondary | ICD-10-CM | POA: Diagnosis not present

## 2021-06-16 DIAGNOSIS — I2510 Atherosclerotic heart disease of native coronary artery without angina pectoris: Secondary | ICD-10-CM | POA: Insufficient documentation

## 2021-06-16 NOTE — Patient Instructions (Signed)
Check blood sugars 1  x day before breakfast or 2 hrs after one meal every day Bring blood sugar records to the next appointment  Exercise:  Walk  for    5-10  minutes   3  days a week and gradually increase to 30 minutes 5 x week (or as directed by heart clinic)  Eat 3 meals day,   1-2  snacks a day Space meals 4-6 hours apart Don't skip meals Complete 3 Day Food Record and bring to next appt  Carry fast acting glucose and a snack at all times Rotate injection sites  Return for appointment on:  Thursday August 06, 2021 at 9:30 am with [facility] (dietitian)

## 2021-06-16 NOTE — Progress Notes (Signed)
Diabetes Self-Management Education  Visit Type: First/Initial  Appt. Start Time: 1100 Appt. End Time: 8841  06/16/2021  [name], identified by name and date of birth, is a 75 y.o. male with a diagnosis of Diabetes: Type 2.   ASSESSMENT  Height 5\' 7"  (1.702 m), weight 213 lb 6.4 oz (96.8 kg). Body mass index is 33.42 kg/m.   Diabetes Self-Management Education - 06/16/21 1448       Visit Information   Visit Type First/Initial      Initial Visit   Diabetes Type Type 2    Are you currently following a meal plan? No    Are you taking your medications as prescribed? Yes    Date Diagnosed 15 + years      Health Coping   How would you rate your overall health? Poor      Psychosocial Assessment   Patient Belief/Attitude about Diabetes Motivated to manage diabetes    Self-care barriers None    Self-management support Doctor's office;Family    Other persons present Spouse/SO;Family Member   Wife and son   Patient Concerns Nutrition/Meal planning;Medication;Monitoring;Healthy Lifestyle;Problem Solving;Glycemic Control;Weight Control;Support    Special Needs None    Preferred Learning Style Other (comment)   Talking   Learning Readiness Ready    How often do you need to have someone help you when you read instructions, pamphlets, or other written materials from your doctor or pharmacy? 1 - Never    What is the last grade level you completed in school? College      Pre-Education Assessment   Patient understands the diabetes disease and treatment process. Needs Review    Patient understands incorporating nutritional management into lifestyle. Needs Instruction    Patient undertands incorporating physical activity into lifestyle. Needs Instruction    Patient understands using medications safely. Needs Review    Patient understands monitoring blood glucose, interpreting and using results Needs Review    Patient understands prevention, detection, and treatment of acute  complications. Needs Instruction    Patient understands prevention, detection, and treatment of chronic complications. Needs Review    Patient understands how to develop strategies to address psychosocial issues. Needs Review    Patient understands how to develop strategies to promote health/change behavior. Needs Review      Complications   Last HgB A1C per patient/outside source 5.2 %   05/25/2021   How often do you check your blood sugar? 1-2 times/day    Fasting Blood glucose range (mg/dL) 70-129   Pt reports averge 105 mg/dL   Have you had a dilated eye exam in the past 12 months? Yes    Have you had a dental exam in the past 12 months? No   dentures   Are you checking your feet? Yes    How many days per week are you checking your feet? 7      Dietary Intake   Snack (morning) has snacks of nuts or sunflower seeds or fruit (fasins, cranberries, peach, plums apple)    Lunch eats breakfast at 12:00 - chicken breast    Dinner 7-8 pm eats sandwiches in summer - tuna fish, peanut butter and jelly; chicken, beef, pork, fish; tortilla pizza, spaghetti, green beans, corn, lettuce, tomatoes, carrots, cuccumbers, peppers, squash    Beverage(s) water, unsweetened tea      Exercise   Exercise Type ADL's      Patient Education   Previous Diabetes Education Yes (please comment)    Disease state  Explored  patient's options for treatment of their diabetes    Nutrition management  Role of diet in the treatment of diabetes and the relationship between the three main macronutrients and blood glucose level;Food label reading, portion sizes and measuring food.;Reviewed blood glucose goals for pre and post meals and how to evaluate the patients' food intake on their blood glucose level.;Information on hints to eating out and maintain blood glucose control.    Physical activity and exercise  Role of exercise on diabetes management, blood pressure control and cardiac health.    Medications Taught/reviewed  insulin injection, site rotation, insulin storage and needle disposal.;Reviewed patients medication for diabetes, action, purpose, timing of dose and side effects.    Monitoring Purpose and frequency of SMBG.;Taught/discussed recording of test results and interpretation of SMBG.;Identified appropriate SMBG and/or A1C goals.    Acute complications Taught treatment of hypoglycemia - the 15 rule.    Chronic complications Relationship between chronic complications and blood glucose control    Psychosocial adjustment Identified and addressed patients feelings and concerns about diabetes      Individualized Goals (developed by patient)   Reducing Risk Other (comment)   improve blood sugars, decrease medications, prevent diabetes complications, lose weight, lead a healthier lifestyle, become more fit     Outcomes   Expected Outcomes Demonstrated interest in learning. Expect positive outcomes    Future DMSE 4-6 wks             Individualized Plan for Diabetes Self-Management Training:   Learning Objective:  Patient will have a greater understanding of diabetes self-management. Patient education plan is to attend individual and/or group sessions per assessed needs and concerns.   Plan:   Patient Instructions  Check blood sugars 1  x day before breakfast or 2 hrs after one meal every day Bring blood sugar records to the next appointment  Exercise:  Walk  for    5-10  minutes   3  days a week and gradually increase to 30 minutes 5 x week (or as directed by heart clinic)  Eat 3 meals day,   1-2  snacks a day Space meals 4-6 hours apart Don't skip meals Complete 3 Day Food Record and bring to next appt  Carry fast acting glucose and a snack at all times Rotate injection sites  Return for appointment on:  Thursday August 06, 2021 at 9:30 am with [facility] (dietitian)   Expected Outcomes:  Demonstrated interest in learning. Expect positive outcomes  Education material provided:  General  Meal Planning Guidelines Simple Meal Plan 3 Day Food Record Glucose tablets Symptoms, causes and treatments of Hypoglycemia   If problems or questions, patient to contact team via:  Johny Drilling, RN, [place] [phone]  Future DSME appointment: 4-6 wks August 06, 2021 with the dietitian

## 2021-06-18 DIAGNOSIS — Z95811 Presence of heart assist device: Secondary | ICD-10-CM | POA: Diagnosis not present

## 2021-06-18 DIAGNOSIS — I5022 Chronic systolic (congestive) heart failure: Secondary | ICD-10-CM | POA: Diagnosis not present

## 2021-06-18 DIAGNOSIS — I519 Heart disease, unspecified: Secondary | ICD-10-CM | POA: Diagnosis not present

## 2021-06-18 DIAGNOSIS — I4901 Ventricular fibrillation: Secondary | ICD-10-CM | POA: Diagnosis not present

## 2021-06-18 DIAGNOSIS — Z8709 Personal history of other diseases of the respiratory system: Secondary | ICD-10-CM | POA: Diagnosis not present

## 2021-06-18 DIAGNOSIS — I472 Ventricular tachycardia: Secondary | ICD-10-CM | POA: Diagnosis not present

## 2021-06-18 DIAGNOSIS — Z7901 Long term (current) use of anticoagulants: Secondary | ICD-10-CM | POA: Diagnosis not present

## 2021-06-18 DIAGNOSIS — E119 Type 2 diabetes mellitus without complications: Secondary | ICD-10-CM | POA: Diagnosis not present

## 2021-06-18 DIAGNOSIS — I255 Ischemic cardiomyopathy: Secondary | ICD-10-CM | POA: Diagnosis not present

## 2021-06-25 DIAGNOSIS — Z95810 Presence of automatic (implantable) cardiac defibrillator: Secondary | ICD-10-CM | POA: Diagnosis not present

## 2021-06-25 DIAGNOSIS — Z4502 Encounter for adjustment and management of automatic implantable cardiac defibrillator: Secondary | ICD-10-CM | POA: Diagnosis not present

## 2021-06-25 DIAGNOSIS — I472 Ventricular tachycardia: Secondary | ICD-10-CM | POA: Diagnosis not present

## 2021-06-26 DIAGNOSIS — R42 Dizziness and giddiness: Secondary | ICD-10-CM | POA: Diagnosis not present

## 2021-06-26 DIAGNOSIS — I6523 Occlusion and stenosis of bilateral carotid arteries: Secondary | ICD-10-CM | POA: Diagnosis not present

## 2021-06-26 DIAGNOSIS — R296 Repeated falls: Secondary | ICD-10-CM | POA: Diagnosis not present

## 2021-06-26 DIAGNOSIS — R29898 Other symptoms and signs involving the musculoskeletal system: Secondary | ICD-10-CM | POA: Diagnosis not present

## 2021-06-26 DIAGNOSIS — I779 Disorder of arteries and arterioles, unspecified: Secondary | ICD-10-CM | POA: Diagnosis not present

## 2021-06-28 DIAGNOSIS — Z7901 Long term (current) use of anticoagulants: Secondary | ICD-10-CM | POA: Diagnosis not present

## 2021-07-01 ENCOUNTER — Other Ambulatory Visit (HOSPITAL_COMMUNITY): Payer: Self-pay | Admitting: Family Medicine

## 2021-07-01 ENCOUNTER — Other Ambulatory Visit: Payer: Self-pay | Admitting: Family Medicine

## 2021-07-01 DIAGNOSIS — I6523 Occlusion and stenosis of bilateral carotid arteries: Secondary | ICD-10-CM

## 2021-07-01 DIAGNOSIS — R2681 Unsteadiness on feet: Secondary | ICD-10-CM

## 2021-07-01 DIAGNOSIS — R42 Dizziness and giddiness: Secondary | ICD-10-CM

## 2021-07-02 DIAGNOSIS — I5022 Chronic systolic (congestive) heart failure: Secondary | ICD-10-CM | POA: Diagnosis not present

## 2021-07-02 DIAGNOSIS — Z95811 Presence of heart assist device: Secondary | ICD-10-CM | POA: Diagnosis not present

## 2021-07-02 DIAGNOSIS — Z7901 Long term (current) use of anticoagulants: Secondary | ICD-10-CM | POA: Diagnosis not present

## 2021-07-03 DIAGNOSIS — Z794 Long term (current) use of insulin: Secondary | ICD-10-CM | POA: Diagnosis not present

## 2021-07-03 DIAGNOSIS — Z95811 Presence of heart assist device: Secondary | ICD-10-CM | POA: Diagnosis not present

## 2021-07-03 DIAGNOSIS — Z6832 Body mass index (BMI) 32.0-32.9, adult: Secondary | ICD-10-CM | POA: Diagnosis not present

## 2021-07-03 DIAGNOSIS — E261 Secondary hyperaldosteronism: Secondary | ICD-10-CM | POA: Diagnosis not present

## 2021-07-03 DIAGNOSIS — I5020 Unspecified systolic (congestive) heart failure: Secondary | ICD-10-CM | POA: Diagnosis not present

## 2021-07-03 DIAGNOSIS — Z7951 Long term (current) use of inhaled steroids: Secondary | ICD-10-CM | POA: Diagnosis not present

## 2021-07-03 DIAGNOSIS — D692 Other nonthrombocytopenic purpura: Secondary | ICD-10-CM | POA: Diagnosis not present

## 2021-07-03 DIAGNOSIS — Z515 Encounter for palliative care: Secondary | ICD-10-CM | POA: Diagnosis not present

## 2021-07-03 DIAGNOSIS — E1159 Type 2 diabetes mellitus with other circulatory complications: Secondary | ICD-10-CM | POA: Diagnosis not present

## 2021-07-03 DIAGNOSIS — J449 Chronic obstructive pulmonary disease, unspecified: Secondary | ICD-10-CM | POA: Diagnosis not present

## 2021-07-15 DIAGNOSIS — H25812 Combined forms of age-related cataract, left eye: Secondary | ICD-10-CM | POA: Diagnosis not present

## 2021-07-15 DIAGNOSIS — Z961 Presence of intraocular lens: Secondary | ICD-10-CM | POA: Diagnosis not present

## 2021-07-16 DIAGNOSIS — Z951 Presence of aortocoronary bypass graft: Secondary | ICD-10-CM | POA: Diagnosis not present

## 2021-07-16 DIAGNOSIS — S6991XA Unspecified injury of right wrist, hand and finger(s), initial encounter: Secondary | ICD-10-CM | POA: Diagnosis not present

## 2021-07-16 DIAGNOSIS — S61012A Laceration without foreign body of left thumb without damage to nail, initial encounter: Secondary | ICD-10-CM | POA: Diagnosis not present

## 2021-07-16 DIAGNOSIS — I2510 Atherosclerotic heart disease of native coronary artery without angina pectoris: Secondary | ICD-10-CM | POA: Diagnosis not present

## 2021-07-16 DIAGNOSIS — R918 Other nonspecific abnormal finding of lung field: Secondary | ICD-10-CM | POA: Diagnosis not present

## 2021-07-16 DIAGNOSIS — S299XXA Unspecified injury of thorax, initial encounter: Secondary | ICD-10-CM | POA: Diagnosis not present

## 2021-07-16 DIAGNOSIS — I5022 Chronic systolic (congestive) heart failure: Secondary | ICD-10-CM | POA: Diagnosis not present

## 2021-07-16 DIAGNOSIS — Z5181 Encounter for therapeutic drug level monitoring: Secondary | ICD-10-CM | POA: Diagnosis not present

## 2021-07-16 DIAGNOSIS — I739 Peripheral vascular disease, unspecified: Secondary | ICD-10-CM | POA: Diagnosis not present

## 2021-07-16 DIAGNOSIS — S6992XA Unspecified injury of left wrist, hand and finger(s), initial encounter: Secondary | ICD-10-CM | POA: Diagnosis not present

## 2021-07-16 DIAGNOSIS — Z87891 Personal history of nicotine dependence: Secondary | ICD-10-CM | POA: Diagnosis not present

## 2021-07-16 DIAGNOSIS — R5381 Other malaise: Secondary | ICD-10-CM | POA: Diagnosis not present

## 2021-07-16 DIAGNOSIS — S61002A Unspecified open wound of left thumb without damage to nail, initial encounter: Secondary | ICD-10-CM | POA: Diagnosis not present

## 2021-07-16 DIAGNOSIS — Z79899 Other long term (current) drug therapy: Secondary | ICD-10-CM | POA: Diagnosis not present

## 2021-07-17 ENCOUNTER — Ambulatory Visit: Admission: RE | Admit: 2021-07-17 | Payer: PPO | Source: Ambulatory Visit

## 2021-07-21 DIAGNOSIS — C61 Malignant neoplasm of prostate: Secondary | ICD-10-CM | POA: Diagnosis not present

## 2021-07-28 DIAGNOSIS — R2681 Unsteadiness on feet: Secondary | ICD-10-CM | POA: Diagnosis not present

## 2021-07-28 DIAGNOSIS — R42 Dizziness and giddiness: Secondary | ICD-10-CM | POA: Diagnosis not present

## 2021-07-28 DIAGNOSIS — I6523 Occlusion and stenosis of bilateral carotid arteries: Secondary | ICD-10-CM | POA: Diagnosis not present

## 2021-07-31 DIAGNOSIS — I739 Peripheral vascular disease, unspecified: Secondary | ICD-10-CM | POA: Diagnosis not present

## 2021-07-31 DIAGNOSIS — I6523 Occlusion and stenosis of bilateral carotid arteries: Secondary | ICD-10-CM | POA: Diagnosis not present

## 2021-08-06 ENCOUNTER — Other Ambulatory Visit: Payer: Self-pay

## 2021-08-06 ENCOUNTER — Encounter: Payer: Self-pay | Admitting: Dietician

## 2021-08-06 ENCOUNTER — Encounter: Payer: PPO | Attending: Family Medicine | Admitting: Dietician

## 2021-08-06 VITALS — Ht 67.0 in | Wt 202.7 lb

## 2021-08-06 DIAGNOSIS — G4733 Obstructive sleep apnea (adult) (pediatric): Secondary | ICD-10-CM | POA: Diagnosis not present

## 2021-08-06 DIAGNOSIS — I25118 Atherosclerotic heart disease of native coronary artery with other forms of angina pectoris: Secondary | ICD-10-CM | POA: Diagnosis not present

## 2021-08-06 DIAGNOSIS — D6869 Other thrombophilia: Secondary | ICD-10-CM | POA: Diagnosis not present

## 2021-08-06 DIAGNOSIS — Z7901 Long term (current) use of anticoagulants: Secondary | ICD-10-CM | POA: Diagnosis not present

## 2021-08-06 DIAGNOSIS — Z515 Encounter for palliative care: Secondary | ICD-10-CM | POA: Diagnosis not present

## 2021-08-06 DIAGNOSIS — Z794 Long term (current) use of insulin: Secondary | ICD-10-CM | POA: Diagnosis not present

## 2021-08-06 DIAGNOSIS — E1159 Type 2 diabetes mellitus with other circulatory complications: Secondary | ICD-10-CM | POA: Insufficient documentation

## 2021-08-06 DIAGNOSIS — I959 Hypotension, unspecified: Secondary | ICD-10-CM | POA: Diagnosis not present

## 2021-08-06 DIAGNOSIS — Z6830 Body mass index (BMI) 30.0-30.9, adult: Secondary | ICD-10-CM | POA: Diagnosis not present

## 2021-08-06 DIAGNOSIS — J9610 Chronic respiratory failure, unspecified whether with hypoxia or hypercapnia: Secondary | ICD-10-CM | POA: Diagnosis not present

## 2021-08-06 DIAGNOSIS — I5020 Unspecified systolic (congestive) heart failure: Secondary | ICD-10-CM | POA: Diagnosis not present

## 2021-08-06 DIAGNOSIS — E1151 Type 2 diabetes mellitus with diabetic peripheral angiopathy without gangrene: Secondary | ICD-10-CM | POA: Diagnosis not present

## 2021-08-06 NOTE — Progress Notes (Signed)
Diabetes Self-Management Education  Visit Type:  Follow-up  Appt. Start Time: 0930 Appt. End Time: 1100  08/06/2021  [name], identified by name and date of birth, is a 75 y.o. male with a diagnosis of Diabetes:  .   ASSESSMENT  Height '5\' 7"'$  (1.702 m), weight 202 lb 11.2 oz (91.9 kg). Body mass index is 31.75 kg/m.    Diabetes Self-Management Education - 08/06/21 0940       Patient Education   Nutrition management  Role of diet in the treatment of diabetes and the relationship between the three main macronutrients and blood glucose level;Meal timing in regards to the patients' current diabetes medication.;Meal options for control of blood glucose level and chronic complications.;Other (comment)   low sodium diet; consistent Vitamin K intake for Coumadin dosing   Physical activity and exercise  Role of exercise on diabetes management, blood pressure control and cardiac health.    Monitoring Taught/discussed recording of test results and interpretation of SMBG.    Acute complications Taught treatment of hypoglycemia - the 15 rule.      Post-Education Assessment   Patient understands the diabetes disease and treatment process. Demonstrates understanding / competency    Patient understands incorporating nutritional management into lifestyle. Demonstrates understanding / competency    Patient undertands incorporating physical activity into lifestyle. Demonstrates understanding / competency    Patient understands using medications safely. Demonstrates understanding / competency    Patient understands monitoring blood glucose, interpreting and using results Demonstrates understanding / competency    Patient understands prevention, detection, and treatment of acute complications. Demonstrates understanding / competency    Patient understands prevention, detection, and treatment of chronic complications. Demonstrates understanding / competency    Patient understands how to develop  strategies to address psychosocial issues. Demonstrates understanding / competency    Patient understands how to develop strategies to promote health/change behavior. Demonstrates understanding / competency      Outcomes   Program Status Completed             Learning Objective:  Patient will have a greater understanding of diabetes self-management. Patient education plan is to attend individual and/or group sessions per assessed needs and concerns.  Additional Notes: Patient has been working on significant diet and lifestyle changes to improve BG control as well as cardiovascular health. He no longer eats fast food meals regularly, in fact at breakfast he has been bringing his own oatmeal packet to restaurant to which hot water is added.  Son is supportive of patient in making changes, and is making lifestyle improvements of his own.  Patient has lost about 11lbs. He states he is feeling much better physically and is motivated to continue with his current eating pattern. Encouraged consistent carb intake, and allowing for small - moderate amounts of carbs with meals for energy, fiber, and overall nutrition.   Plan:   Patient Instructions  Try Healthy Choice canned soups for a lower sodium option. Most are 400-'500mg'$  sodium. Have a small portion of a starch food with each meal, 1 cup (size of a fist) or less. If blood sugar feels low, run a test to confirm if possible. If under 70, take 3-4 glucose tablets or 4oz juice, allow 10-15 minutes to raise sugar to normal, then eat a meals or snack within 30 minutes. Great job making healthy food choices and controlling portions! Keep up the good work!   Expected Outcomes:  Demonstrated interest in learning. Expect positive outcomes  Education material provided: Quick  and Simple Meal Ideas; Nutrition Menus for [employer], [employer], and [employer] with lowest sodium, fat, and carb items marked  If problems or questions, patient to contact team  via:  Phone

## 2021-08-06 NOTE — Patient Instructions (Signed)
Try Healthy Choice canned soups for a lower sodium option. Most are 400-'500mg'$  sodium. Have a small portion of a starch food with each meal, 1 cup (size of a fist) or less. If blood sugar feels low, run a test to confirm if possible. If under 70, take 3-4 glucose tablets or 4oz juice, allow 10-15 minutes to raise sugar to normal, then eat a meals or snack within 30 minutes. Great job making healthy food choices and controlling portions! Keep up the good work!

## 2021-08-07 DIAGNOSIS — Z7901 Long term (current) use of anticoagulants: Secondary | ICD-10-CM | POA: Diagnosis not present

## 2021-08-13 DIAGNOSIS — A498 Other bacterial infections of unspecified site: Secondary | ICD-10-CM | POA: Insufficient documentation

## 2021-08-13 DIAGNOSIS — B952 Enterococcus as the cause of diseases classified elsewhere: Secondary | ICD-10-CM | POA: Insufficient documentation

## 2021-08-24 DIAGNOSIS — I5022 Chronic systolic (congestive) heart failure: Secondary | ICD-10-CM | POA: Diagnosis not present

## 2021-08-24 DIAGNOSIS — R413 Other amnesia: Secondary | ICD-10-CM | POA: Diagnosis not present

## 2021-09-01 DIAGNOSIS — Z95811 Presence of heart assist device: Secondary | ICD-10-CM | POA: Diagnosis not present

## 2021-09-01 DIAGNOSIS — I5023 Acute on chronic systolic (congestive) heart failure: Secondary | ICD-10-CM | POA: Diagnosis not present

## 2021-09-01 DIAGNOSIS — Z7901 Long term (current) use of anticoagulants: Secondary | ICD-10-CM | POA: Diagnosis not present

## 2021-09-01 DIAGNOSIS — I5022 Chronic systolic (congestive) heart failure: Secondary | ICD-10-CM | POA: Diagnosis not present

## 2021-09-04 DIAGNOSIS — Z7901 Long term (current) use of anticoagulants: Secondary | ICD-10-CM | POA: Diagnosis not present

## 2021-09-09 DIAGNOSIS — E1159 Type 2 diabetes mellitus with other circulatory complications: Secondary | ICD-10-CM | POA: Diagnosis not present

## 2021-09-15 DIAGNOSIS — Z794 Long term (current) use of insulin: Secondary | ICD-10-CM | POA: Diagnosis not present

## 2021-09-15 DIAGNOSIS — E1159 Type 2 diabetes mellitus with other circulatory complications: Secondary | ICD-10-CM | POA: Diagnosis not present

## 2021-09-15 DIAGNOSIS — N1832 Chronic kidney disease, stage 3b: Secondary | ICD-10-CM | POA: Diagnosis not present

## 2021-09-15 DIAGNOSIS — E1122 Type 2 diabetes mellitus with diabetic chronic kidney disease: Secondary | ICD-10-CM | POA: Diagnosis not present

## 2021-09-16 DIAGNOSIS — R42 Dizziness and giddiness: Secondary | ICD-10-CM | POA: Diagnosis not present

## 2021-09-16 DIAGNOSIS — G4733 Obstructive sleep apnea (adult) (pediatric): Secondary | ICD-10-CM | POA: Diagnosis not present

## 2021-09-16 DIAGNOSIS — N183 Chronic kidney disease, stage 3 unspecified: Secondary | ICD-10-CM | POA: Diagnosis not present

## 2021-09-16 DIAGNOSIS — I25118 Atherosclerotic heart disease of native coronary artery with other forms of angina pectoris: Secondary | ICD-10-CM | POA: Diagnosis not present

## 2021-09-16 DIAGNOSIS — D692 Other nonthrombocytopenic purpura: Secondary | ICD-10-CM | POA: Diagnosis not present

## 2021-09-16 DIAGNOSIS — E1159 Type 2 diabetes mellitus with other circulatory complications: Secondary | ICD-10-CM | POA: Diagnosis not present

## 2021-09-16 DIAGNOSIS — I5020 Unspecified systolic (congestive) heart failure: Secondary | ICD-10-CM | POA: Diagnosis not present

## 2021-09-16 DIAGNOSIS — R531 Weakness: Secondary | ICD-10-CM | POA: Diagnosis not present

## 2021-09-16 DIAGNOSIS — E119 Type 2 diabetes mellitus without complications: Secondary | ICD-10-CM | POA: Diagnosis not present

## 2021-09-16 DIAGNOSIS — E1122 Type 2 diabetes mellitus with diabetic chronic kidney disease: Secondary | ICD-10-CM | POA: Diagnosis not present

## 2021-09-16 DIAGNOSIS — J449 Chronic obstructive pulmonary disease, unspecified: Secondary | ICD-10-CM | POA: Diagnosis not present

## 2021-09-16 DIAGNOSIS — Z515 Encounter for palliative care: Secondary | ICD-10-CM | POA: Diagnosis not present

## 2021-09-16 DIAGNOSIS — E1151 Type 2 diabetes mellitus with diabetic peripheral angiopathy without gangrene: Secondary | ICD-10-CM | POA: Diagnosis not present

## 2021-09-29 DIAGNOSIS — Z87891 Personal history of nicotine dependence: Secondary | ICD-10-CM | POA: Diagnosis not present

## 2021-09-29 DIAGNOSIS — N400 Enlarged prostate without lower urinary tract symptoms: Secondary | ICD-10-CM | POA: Diagnosis not present

## 2021-09-29 DIAGNOSIS — I472 Ventricular tachycardia, unspecified: Secondary | ICD-10-CM | POA: Diagnosis not present

## 2021-09-29 DIAGNOSIS — B952 Enterococcus as the cause of diseases classified elsewhere: Secondary | ICD-10-CM | POA: Diagnosis not present

## 2021-09-29 DIAGNOSIS — Z9981 Dependence on supplemental oxygen: Secondary | ICD-10-CM | POA: Diagnosis not present

## 2021-09-29 DIAGNOSIS — Z95810 Presence of automatic (implantable) cardiac defibrillator: Secondary | ICD-10-CM | POA: Diagnosis not present

## 2021-09-29 DIAGNOSIS — R7881 Bacteremia: Secondary | ICD-10-CM | POA: Diagnosis not present

## 2021-09-29 DIAGNOSIS — Z23 Encounter for immunization: Secondary | ICD-10-CM | POA: Diagnosis not present

## 2021-09-29 DIAGNOSIS — T827XXA Infection and inflammatory reaction due to other cardiac and vascular devices, implants and grafts, initial encounter: Secondary | ICD-10-CM | POA: Diagnosis not present

## 2021-09-29 DIAGNOSIS — J1282 Pneumonia due to coronavirus disease 2019: Secondary | ICD-10-CM | POA: Diagnosis not present

## 2021-09-29 DIAGNOSIS — N189 Chronic kidney disease, unspecified: Secondary | ICD-10-CM | POA: Diagnosis not present

## 2021-09-29 DIAGNOSIS — U071 COVID-19: Secondary | ICD-10-CM | POA: Diagnosis not present

## 2021-09-29 DIAGNOSIS — Z8616 Personal history of COVID-19: Secondary | ICD-10-CM | POA: Diagnosis not present

## 2021-09-29 DIAGNOSIS — E1122 Type 2 diabetes mellitus with diabetic chronic kidney disease: Secondary | ICD-10-CM | POA: Diagnosis not present

## 2021-09-29 DIAGNOSIS — E785 Hyperlipidemia, unspecified: Secondary | ICD-10-CM | POA: Diagnosis not present

## 2021-09-29 DIAGNOSIS — I255 Ischemic cardiomyopathy: Secondary | ICD-10-CM | POA: Diagnosis not present

## 2021-09-29 DIAGNOSIS — Z951 Presence of aortocoronary bypass graft: Secondary | ICD-10-CM | POA: Diagnosis not present

## 2021-09-29 DIAGNOSIS — Z95811 Presence of heart assist device: Secondary | ICD-10-CM | POA: Diagnosis not present

## 2021-09-29 DIAGNOSIS — I2510 Atherosclerotic heart disease of native coronary artery without angina pectoris: Secondary | ICD-10-CM | POA: Diagnosis not present

## 2021-09-29 DIAGNOSIS — J449 Chronic obstructive pulmonary disease, unspecified: Secondary | ICD-10-CM | POA: Diagnosis not present

## 2021-09-29 DIAGNOSIS — Z955 Presence of coronary angioplasty implant and graft: Secondary | ICD-10-CM | POA: Diagnosis not present

## 2021-09-29 DIAGNOSIS — Z4502 Encounter for adjustment and management of automatic implantable cardiac defibrillator: Secondary | ICD-10-CM | POA: Diagnosis not present

## 2021-09-29 DIAGNOSIS — I129 Hypertensive chronic kidney disease with stage 1 through stage 4 chronic kidney disease, or unspecified chronic kidney disease: Secondary | ICD-10-CM | POA: Diagnosis not present

## 2021-10-01 DIAGNOSIS — I255 Ischemic cardiomyopathy: Secondary | ICD-10-CM | POA: Diagnosis not present

## 2021-10-01 DIAGNOSIS — Z79899 Other long term (current) drug therapy: Secondary | ICD-10-CM | POA: Diagnosis not present

## 2021-10-01 DIAGNOSIS — Z95810 Presence of automatic (implantable) cardiac defibrillator: Secondary | ICD-10-CM | POA: Diagnosis not present

## 2021-10-01 DIAGNOSIS — I5022 Chronic systolic (congestive) heart failure: Secondary | ICD-10-CM | POA: Diagnosis not present

## 2021-10-01 DIAGNOSIS — Z4502 Encounter for adjustment and management of automatic implantable cardiac defibrillator: Secondary | ICD-10-CM | POA: Diagnosis not present

## 2021-10-01 DIAGNOSIS — Z7901 Long term (current) use of anticoagulants: Secondary | ICD-10-CM | POA: Diagnosis not present

## 2021-10-01 DIAGNOSIS — I4729 Other ventricular tachycardia: Secondary | ICD-10-CM | POA: Diagnosis not present

## 2021-10-02 DIAGNOSIS — Z7901 Long term (current) use of anticoagulants: Secondary | ICD-10-CM | POA: Diagnosis not present

## 2021-10-13 DIAGNOSIS — J449 Chronic obstructive pulmonary disease, unspecified: Secondary | ICD-10-CM | POA: Diagnosis not present

## 2021-10-13 DIAGNOSIS — Z6832 Body mass index (BMI) 32.0-32.9, adult: Secondary | ICD-10-CM | POA: Diagnosis not present

## 2021-10-13 DIAGNOSIS — I447 Left bundle-branch block, unspecified: Secondary | ICD-10-CM | POA: Diagnosis not present

## 2021-10-13 DIAGNOSIS — H2512 Age-related nuclear cataract, left eye: Secondary | ICD-10-CM | POA: Diagnosis not present

## 2021-10-13 DIAGNOSIS — I472 Ventricular tachycardia, unspecified: Secondary | ICD-10-CM | POA: Diagnosis not present

## 2021-10-13 DIAGNOSIS — Z8616 Personal history of COVID-19: Secondary | ICD-10-CM | POA: Diagnosis not present

## 2021-10-13 DIAGNOSIS — I440 Atrioventricular block, first degree: Secondary | ICD-10-CM | POA: Diagnosis not present

## 2021-10-13 DIAGNOSIS — I509 Heart failure, unspecified: Secondary | ICD-10-CM | POA: Diagnosis not present

## 2021-10-13 DIAGNOSIS — H25812 Combined forms of age-related cataract, left eye: Secondary | ICD-10-CM | POA: Diagnosis not present

## 2021-10-13 DIAGNOSIS — I5022 Chronic systolic (congestive) heart failure: Secondary | ICD-10-CM | POA: Diagnosis not present

## 2021-10-13 DIAGNOSIS — E119 Type 2 diabetes mellitus without complications: Secondary | ICD-10-CM | POA: Diagnosis not present

## 2021-10-13 DIAGNOSIS — I110 Hypertensive heart disease with heart failure: Secondary | ICD-10-CM | POA: Diagnosis not present

## 2021-10-13 DIAGNOSIS — E1136 Type 2 diabetes mellitus with diabetic cataract: Secondary | ICD-10-CM | POA: Diagnosis not present

## 2021-10-13 DIAGNOSIS — Z794 Long term (current) use of insulin: Secondary | ICD-10-CM | POA: Diagnosis not present

## 2021-10-13 DIAGNOSIS — I255 Ischemic cardiomyopathy: Secondary | ICD-10-CM | POA: Diagnosis not present

## 2021-10-13 DIAGNOSIS — Z87891 Personal history of nicotine dependence: Secondary | ICD-10-CM | POA: Diagnosis not present

## 2021-10-13 DIAGNOSIS — I4891 Unspecified atrial fibrillation: Secondary | ICD-10-CM | POA: Diagnosis not present

## 2021-10-13 DIAGNOSIS — Z95810 Presence of automatic (implantable) cardiac defibrillator: Secondary | ICD-10-CM | POA: Diagnosis not present

## 2021-10-13 DIAGNOSIS — D638 Anemia in other chronic diseases classified elsewhere: Secondary | ICD-10-CM | POA: Diagnosis not present

## 2021-10-13 DIAGNOSIS — K219 Gastro-esophageal reflux disease without esophagitis: Secondary | ICD-10-CM | POA: Diagnosis not present

## 2021-10-13 DIAGNOSIS — G4733 Obstructive sleep apnea (adult) (pediatric): Secondary | ICD-10-CM | POA: Diagnosis not present

## 2021-10-13 DIAGNOSIS — H5703 Miosis: Secondary | ICD-10-CM | POA: Diagnosis not present

## 2021-10-21 DIAGNOSIS — Z95811 Presence of heart assist device: Secondary | ICD-10-CM | POA: Diagnosis not present

## 2021-10-21 DIAGNOSIS — J9610 Chronic respiratory failure, unspecified whether with hypoxia or hypercapnia: Secondary | ICD-10-CM | POA: Diagnosis not present

## 2021-10-21 DIAGNOSIS — G4733 Obstructive sleep apnea (adult) (pediatric): Secondary | ICD-10-CM | POA: Diagnosis not present

## 2021-10-21 DIAGNOSIS — Z7901 Long term (current) use of anticoagulants: Secondary | ICD-10-CM | POA: Diagnosis not present

## 2021-10-21 DIAGNOSIS — D6869 Other thrombophilia: Secondary | ICD-10-CM | POA: Diagnosis not present

## 2021-10-21 DIAGNOSIS — Z515 Encounter for palliative care: Secondary | ICD-10-CM | POA: Diagnosis not present

## 2021-10-21 DIAGNOSIS — E1159 Type 2 diabetes mellitus with other circulatory complications: Secondary | ICD-10-CM | POA: Diagnosis not present

## 2021-10-21 DIAGNOSIS — Z6830 Body mass index (BMI) 30.0-30.9, adult: Secondary | ICD-10-CM | POA: Diagnosis not present

## 2021-10-21 DIAGNOSIS — Z794 Long term (current) use of insulin: Secondary | ICD-10-CM | POA: Diagnosis not present

## 2021-10-21 DIAGNOSIS — D692 Other nonthrombocytopenic purpura: Secondary | ICD-10-CM | POA: Diagnosis not present

## 2021-10-21 DIAGNOSIS — I5020 Unspecified systolic (congestive) heart failure: Secondary | ICD-10-CM | POA: Diagnosis not present

## 2021-10-30 DIAGNOSIS — Z7901 Long term (current) use of anticoagulants: Secondary | ICD-10-CM | POA: Diagnosis not present

## 2021-11-09 DIAGNOSIS — J209 Acute bronchitis, unspecified: Secondary | ICD-10-CM | POA: Diagnosis not present

## 2021-11-24 DIAGNOSIS — E119 Type 2 diabetes mellitus without complications: Secondary | ICD-10-CM | POA: Diagnosis not present

## 2021-11-26 DIAGNOSIS — Z6829 Body mass index (BMI) 29.0-29.9, adult: Secondary | ICD-10-CM | POA: Diagnosis not present

## 2021-11-26 DIAGNOSIS — E1159 Type 2 diabetes mellitus with other circulatory complications: Secondary | ICD-10-CM | POA: Diagnosis not present

## 2021-11-26 DIAGNOSIS — Z95811 Presence of heart assist device: Secondary | ICD-10-CM | POA: Diagnosis not present

## 2021-11-26 DIAGNOSIS — I5020 Unspecified systolic (congestive) heart failure: Secondary | ICD-10-CM | POA: Diagnosis not present

## 2021-11-26 DIAGNOSIS — Z794 Long term (current) use of insulin: Secondary | ICD-10-CM | POA: Diagnosis not present

## 2021-11-26 DIAGNOSIS — D692 Other nonthrombocytopenic purpura: Secondary | ICD-10-CM | POA: Diagnosis not present

## 2021-11-27 DIAGNOSIS — Z7901 Long term (current) use of anticoagulants: Secondary | ICD-10-CM | POA: Diagnosis not present

## 2021-12-01 DIAGNOSIS — K921 Melena: Secondary | ICD-10-CM | POA: Diagnosis not present

## 2021-12-01 DIAGNOSIS — Z7901 Long term (current) use of anticoagulants: Secondary | ICD-10-CM | POA: Diagnosis not present

## 2021-12-03 DIAGNOSIS — Z7901 Long term (current) use of anticoagulants: Secondary | ICD-10-CM | POA: Diagnosis not present

## 2021-12-08 DIAGNOSIS — K317 Polyp of stomach and duodenum: Secondary | ICD-10-CM | POA: Diagnosis not present

## 2021-12-08 DIAGNOSIS — I2510 Atherosclerotic heart disease of native coronary artery without angina pectoris: Secondary | ICD-10-CM | POA: Diagnosis not present

## 2021-12-08 DIAGNOSIS — I5022 Chronic systolic (congestive) heart failure: Secondary | ICD-10-CM | POA: Diagnosis not present

## 2021-12-08 DIAGNOSIS — D649 Anemia, unspecified: Secondary | ICD-10-CM | POA: Diagnosis not present

## 2021-12-08 DIAGNOSIS — I255 Ischemic cardiomyopathy: Secondary | ICD-10-CM | POA: Diagnosis not present

## 2021-12-08 DIAGNOSIS — K3189 Other diseases of stomach and duodenum: Secondary | ICD-10-CM | POA: Diagnosis not present

## 2021-12-08 DIAGNOSIS — Z95811 Presence of heart assist device: Secondary | ICD-10-CM | POA: Diagnosis not present

## 2021-12-08 DIAGNOSIS — N183 Chronic kidney disease, stage 3 unspecified: Secondary | ICD-10-CM | POA: Diagnosis not present

## 2021-12-08 DIAGNOSIS — I130 Hypertensive heart and chronic kidney disease with heart failure and stage 1 through stage 4 chronic kidney disease, or unspecified chronic kidney disease: Secondary | ICD-10-CM | POA: Diagnosis not present

## 2021-12-08 DIAGNOSIS — K319 Disease of stomach and duodenum, unspecified: Secondary | ICD-10-CM | POA: Diagnosis not present

## 2021-12-08 DIAGNOSIS — D500 Iron deficiency anemia secondary to blood loss (chronic): Secondary | ICD-10-CM | POA: Diagnosis not present

## 2021-12-08 DIAGNOSIS — J449 Chronic obstructive pulmonary disease, unspecified: Secondary | ICD-10-CM | POA: Diagnosis not present

## 2021-12-08 DIAGNOSIS — E785 Hyperlipidemia, unspecified: Secondary | ICD-10-CM | POA: Diagnosis not present

## 2021-12-08 DIAGNOSIS — K259 Gastric ulcer, unspecified as acute or chronic, without hemorrhage or perforation: Secondary | ICD-10-CM | POA: Diagnosis not present

## 2021-12-08 DIAGNOSIS — I472 Ventricular tachycardia, unspecified: Secondary | ICD-10-CM | POA: Diagnosis not present

## 2021-12-08 DIAGNOSIS — K635 Polyp of colon: Secondary | ICD-10-CM | POA: Diagnosis not present

## 2021-12-08 DIAGNOSIS — Z794 Long term (current) use of insulin: Secondary | ICD-10-CM | POA: Diagnosis not present

## 2021-12-08 DIAGNOSIS — I4891 Unspecified atrial fibrillation: Secondary | ICD-10-CM | POA: Diagnosis not present

## 2021-12-08 DIAGNOSIS — Z79899 Other long term (current) drug therapy: Secondary | ICD-10-CM | POA: Diagnosis not present

## 2021-12-08 DIAGNOSIS — R0602 Shortness of breath: Secondary | ICD-10-CM | POA: Diagnosis not present

## 2021-12-08 DIAGNOSIS — D1339 Benign neoplasm of other parts of small intestine: Secondary | ICD-10-CM | POA: Diagnosis not present

## 2021-12-08 DIAGNOSIS — E1122 Type 2 diabetes mellitus with diabetic chronic kidney disease: Secondary | ICD-10-CM | POA: Diagnosis not present

## 2021-12-08 DIAGNOSIS — K921 Melena: Secondary | ICD-10-CM | POA: Diagnosis not present

## 2021-12-08 DIAGNOSIS — I4901 Ventricular fibrillation: Secondary | ICD-10-CM | POA: Diagnosis not present

## 2021-12-08 DIAGNOSIS — G4733 Obstructive sleep apnea (adult) (pediatric): Secondary | ICD-10-CM | POA: Diagnosis not present

## 2021-12-08 DIAGNOSIS — E1142 Type 2 diabetes mellitus with diabetic polyneuropathy: Secondary | ICD-10-CM | POA: Diagnosis not present

## 2021-12-08 DIAGNOSIS — Z8616 Personal history of COVID-19: Secondary | ICD-10-CM | POA: Diagnosis not present

## 2021-12-08 DIAGNOSIS — I25810 Atherosclerosis of coronary artery bypass graft(s) without angina pectoris: Secondary | ICD-10-CM | POA: Diagnosis not present

## 2021-12-08 DIAGNOSIS — T18198A Other foreign object in esophagus causing other injury, initial encounter: Secondary | ICD-10-CM | POA: Diagnosis not present

## 2021-12-08 DIAGNOSIS — K5900 Constipation, unspecified: Secondary | ICD-10-CM | POA: Diagnosis not present

## 2021-12-08 DIAGNOSIS — N400 Enlarged prostate without lower urinary tract symptoms: Secondary | ICD-10-CM | POA: Diagnosis not present

## 2021-12-08 DIAGNOSIS — K219 Gastro-esophageal reflux disease without esophagitis: Secondary | ICD-10-CM | POA: Diagnosis not present

## 2021-12-09 DIAGNOSIS — T18198A Other foreign object in esophagus causing other injury, initial encounter: Secondary | ICD-10-CM | POA: Diagnosis not present

## 2021-12-09 DIAGNOSIS — K259 Gastric ulcer, unspecified as acute or chronic, without hemorrhage or perforation: Secondary | ICD-10-CM | POA: Diagnosis not present

## 2021-12-09 DIAGNOSIS — K3189 Other diseases of stomach and duodenum: Secondary | ICD-10-CM | POA: Diagnosis not present

## 2021-12-09 DIAGNOSIS — K635 Polyp of colon: Secondary | ICD-10-CM | POA: Diagnosis not present

## 2021-12-09 DIAGNOSIS — K921 Melena: Secondary | ICD-10-CM | POA: Diagnosis not present

## 2021-12-09 DIAGNOSIS — K317 Polyp of stomach and duodenum: Secondary | ICD-10-CM | POA: Diagnosis not present

## 2021-12-09 DIAGNOSIS — Z95811 Presence of heart assist device: Secondary | ICD-10-CM | POA: Diagnosis not present

## 2021-12-09 DIAGNOSIS — D649 Anemia, unspecified: Secondary | ICD-10-CM | POA: Diagnosis not present

## 2021-12-10 DIAGNOSIS — K921 Melena: Secondary | ICD-10-CM | POA: Diagnosis not present

## 2021-12-11 DIAGNOSIS — E1159 Type 2 diabetes mellitus with other circulatory complications: Secondary | ICD-10-CM | POA: Diagnosis not present

## 2021-12-11 DIAGNOSIS — Z95810 Presence of automatic (implantable) cardiac defibrillator: Secondary | ICD-10-CM | POA: Diagnosis not present

## 2021-12-11 DIAGNOSIS — I5020 Unspecified systolic (congestive) heart failure: Secondary | ICD-10-CM | POA: Diagnosis not present

## 2021-12-11 DIAGNOSIS — Z7901 Long term (current) use of anticoagulants: Secondary | ICD-10-CM | POA: Diagnosis not present

## 2021-12-11 DIAGNOSIS — Z95811 Presence of heart assist device: Secondary | ICD-10-CM | POA: Diagnosis not present

## 2021-12-11 DIAGNOSIS — I25118 Atherosclerotic heart disease of native coronary artery with other forms of angina pectoris: Secondary | ICD-10-CM | POA: Diagnosis not present

## 2021-12-11 DIAGNOSIS — Z951 Presence of aortocoronary bypass graft: Secondary | ICD-10-CM | POA: Diagnosis not present

## 2021-12-11 DIAGNOSIS — Z794 Long term (current) use of insulin: Secondary | ICD-10-CM | POA: Diagnosis not present

## 2021-12-11 DIAGNOSIS — E1151 Type 2 diabetes mellitus with diabetic peripheral angiopathy without gangrene: Secondary | ICD-10-CM | POA: Diagnosis not present

## 2021-12-15 ENCOUNTER — Other Ambulatory Visit: Payer: Self-pay

## 2021-12-15 ENCOUNTER — Other Ambulatory Visit: Payer: PPO

## 2021-12-15 DIAGNOSIS — K921 Melena: Secondary | ICD-10-CM | POA: Diagnosis not present

## 2021-12-15 DIAGNOSIS — E119 Type 2 diabetes mellitus without complications: Secondary | ICD-10-CM | POA: Diagnosis not present

## 2021-12-15 DIAGNOSIS — Z515 Encounter for palliative care: Secondary | ICD-10-CM

## 2021-12-15 NOTE — Progress Notes (Signed)
PATIENT NAME: [name] DOB: Dec 18, 1945 MRN: [id number]  PRIMARY CARE PROVIDER: [name], MD  RESPONSIBLE PARTY:  Acct ID - Guarantor Home Phone Work Phone Relationship Acct Type  [id number] - [place]* [id number]  Self P/F     [place], [place], [place] [address]   Due to the COVID-19 crisis, this visit was done via telemedicine from my office and it was initiated and consent by this patient and or family.  I connected with  [name] OR PROXY on 12/15/21 by telephone and verified that I am speaking with the correct person using two identifiers.   I discussed the limitations of evaluation and management by telemedicine. The patient expressed understanding and agreed to proceed.   PLAN OF CARE and INTERVENTIONS:               1.  GOALS OF CARE/ ADVANCE CARE PLANNING:  Remain home with the assistance of family and private sitters.                2.  PATIENT/CAREGIVER EDUCATION:  CHF.               4. PERSONAL EMERGENCY PLAN:  Activate 911 for emergencies.               5.  DISEASE STATUS:  Connected by telephone with patient but he has requested that I speak with his son [name].   Hospitalization:  [name] shared patient was hospitalized last week for a GI Bleed.  After Christmas, son suspected another GI Bleed and continued to monitor patient at home.  Coumadin was stopped on 12/01/21.  Patient did a follow up visit last with and was sent to the ED due to a low Hgb.  Patient underwent an upper GI endoscopy but no source of bleeding was found.  Coumadin has been resumed at a maintenance dose of 1 mg per son.  CHF:  Patient continues to be followed by the LVAD clinic.  No complications are reported by son.  Weight is being checked daily.  Today's weight is 188 lbs.  Weight gain is managed by diuretics.  Son endorses 3 lb weight gain triggers diuretic use.  Overall, he feels patient has done well with no fluid volume overload.   DM:  Blood sugars are averaging in  the low 100's and are checked 2 x weekly. Insulin decreased due to low readings last month. Son reports ongoing education on diet and they are working to make better food choices.  Patient will follow up with endocrinology next month.  He is hopeful to see a decrease in patient's Hgb A1 C.   Pain:  Patient continues to endorse left leg pain related to a blockage.  Son noted patient is not a surgical candidate so they are managing pain as best as possible.    Support:   Patient continues to be followed monthly by [name].  Son reports they now have a Charity fundraiser assisting 2 x weekly which has been very helpful.   Update provided to Daniel Boxer, NP for Palliative Care.    HISTORY OF PRESENT ILLNESS:  [name] is a 76 y.o. year old male  with chronic systolic heart failure, cardiomyopathy, myocardial infarction, hypertension, hypercholesterolemia, copd, diabetes, anemia, prostate cancer, depression, GERD, gout, macular degeneration, obstructive sleep apnea, on CPAP .  CODE STATUS: Full ADVANCED DIRECTIVES: No MOST FORM: Yes PPS: 60%         Lorenza Burton, RN

## 2021-12-17 DIAGNOSIS — D649 Anemia, unspecified: Secondary | ICD-10-CM | POA: Diagnosis not present

## 2021-12-17 DIAGNOSIS — I6523 Occlusion and stenosis of bilateral carotid arteries: Secondary | ICD-10-CM | POA: Insufficient documentation

## 2021-12-17 DIAGNOSIS — Z7901 Long term (current) use of anticoagulants: Secondary | ICD-10-CM | POA: Diagnosis not present

## 2021-12-17 DIAGNOSIS — Z8719 Personal history of other diseases of the digestive system: Secondary | ICD-10-CM | POA: Diagnosis not present

## 2021-12-17 DIAGNOSIS — I70219 Atherosclerosis of native arteries of extremities with intermittent claudication, unspecified extremity: Secondary | ICD-10-CM | POA: Diagnosis not present

## 2021-12-17 DIAGNOSIS — Z95811 Presence of heart assist device: Secondary | ICD-10-CM | POA: Diagnosis not present

## 2021-12-17 DIAGNOSIS — I5022 Chronic systolic (congestive) heart failure: Secondary | ICD-10-CM | POA: Diagnosis not present

## 2021-12-17 DIAGNOSIS — K5900 Constipation, unspecified: Secondary | ICD-10-CM | POA: Diagnosis not present

## 2021-12-17 DIAGNOSIS — I472 Ventricular tachycardia, unspecified: Secondary | ICD-10-CM | POA: Diagnosis not present

## 2021-12-17 DIAGNOSIS — D500 Iron deficiency anemia secondary to blood loss (chronic): Secondary | ICD-10-CM | POA: Diagnosis not present

## 2021-12-24 DIAGNOSIS — I5022 Chronic systolic (congestive) heart failure: Secondary | ICD-10-CM | POA: Diagnosis not present

## 2021-12-24 DIAGNOSIS — R413 Other amnesia: Secondary | ICD-10-CM | POA: Diagnosis not present

## 2022-01-02 DIAGNOSIS — Z7901 Long term (current) use of anticoagulants: Secondary | ICD-10-CM | POA: Diagnosis not present

## 2022-01-13 DIAGNOSIS — N1832 Chronic kidney disease, stage 3b: Secondary | ICD-10-CM | POA: Diagnosis not present

## 2022-01-13 DIAGNOSIS — E1122 Type 2 diabetes mellitus with diabetic chronic kidney disease: Secondary | ICD-10-CM | POA: Diagnosis not present

## 2022-01-13 DIAGNOSIS — D500 Iron deficiency anemia secondary to blood loss (chronic): Secondary | ICD-10-CM | POA: Diagnosis not present

## 2022-01-13 DIAGNOSIS — Z794 Long term (current) use of insulin: Secondary | ICD-10-CM | POA: Diagnosis not present

## 2022-01-19 DIAGNOSIS — E1122 Type 2 diabetes mellitus with diabetic chronic kidney disease: Secondary | ICD-10-CM | POA: Diagnosis not present

## 2022-01-19 DIAGNOSIS — F3342 Major depressive disorder, recurrent, in full remission: Secondary | ICD-10-CM | POA: Diagnosis not present

## 2022-01-19 DIAGNOSIS — I5020 Unspecified systolic (congestive) heart failure: Secondary | ICD-10-CM | POA: Diagnosis not present

## 2022-01-19 DIAGNOSIS — J449 Chronic obstructive pulmonary disease, unspecified: Secondary | ICD-10-CM | POA: Diagnosis not present

## 2022-01-19 DIAGNOSIS — C61 Malignant neoplasm of prostate: Secondary | ICD-10-CM | POA: Diagnosis not present

## 2022-01-19 DIAGNOSIS — E1159 Type 2 diabetes mellitus with other circulatory complications: Secondary | ICD-10-CM | POA: Diagnosis not present

## 2022-01-19 DIAGNOSIS — D692 Other nonthrombocytopenic purpura: Secondary | ICD-10-CM | POA: Diagnosis not present

## 2022-01-19 DIAGNOSIS — Z95811 Presence of heart assist device: Secondary | ICD-10-CM | POA: Diagnosis not present

## 2022-01-19 DIAGNOSIS — Z794 Long term (current) use of insulin: Secondary | ICD-10-CM | POA: Diagnosis not present

## 2022-01-19 DIAGNOSIS — I472 Ventricular tachycardia, unspecified: Secondary | ICD-10-CM | POA: Diagnosis not present

## 2022-01-19 DIAGNOSIS — N183 Chronic kidney disease, stage 3 unspecified: Secondary | ICD-10-CM | POA: Diagnosis not present

## 2022-01-19 DIAGNOSIS — D6869 Other thrombophilia: Secondary | ICD-10-CM | POA: Diagnosis not present

## 2022-01-20 DIAGNOSIS — Z8719 Personal history of other diseases of the digestive system: Secondary | ICD-10-CM | POA: Diagnosis not present

## 2022-01-20 DIAGNOSIS — N1832 Chronic kidney disease, stage 3b: Secondary | ICD-10-CM | POA: Diagnosis not present

## 2022-01-20 DIAGNOSIS — I739 Peripheral vascular disease, unspecified: Secondary | ICD-10-CM | POA: Diagnosis not present

## 2022-01-20 DIAGNOSIS — E1159 Type 2 diabetes mellitus with other circulatory complications: Secondary | ICD-10-CM | POA: Diagnosis not present

## 2022-01-20 DIAGNOSIS — Z794 Long term (current) use of insulin: Secondary | ICD-10-CM | POA: Diagnosis not present

## 2022-01-20 DIAGNOSIS — E1122 Type 2 diabetes mellitus with diabetic chronic kidney disease: Secondary | ICD-10-CM | POA: Diagnosis not present

## 2022-01-25 DIAGNOSIS — H269 Unspecified cataract: Secondary | ICD-10-CM | POA: Insufficient documentation

## 2022-01-26 ENCOUNTER — Encounter: Payer: Self-pay | Admitting: Podiatry

## 2022-01-26 ENCOUNTER — Other Ambulatory Visit: Payer: Self-pay

## 2022-01-26 ENCOUNTER — Ambulatory Visit (INDEPENDENT_AMBULATORY_CARE_PROVIDER_SITE_OTHER): Payer: No Typology Code available for payment source | Admitting: Podiatry

## 2022-01-26 DIAGNOSIS — M79675 Pain in left toe(s): Secondary | ICD-10-CM | POA: Diagnosis not present

## 2022-01-26 DIAGNOSIS — M79674 Pain in right toe(s): Secondary | ICD-10-CM | POA: Diagnosis not present

## 2022-01-26 DIAGNOSIS — B351 Tinea unguium: Secondary | ICD-10-CM

## 2022-01-26 DIAGNOSIS — E0843 Diabetes mellitus due to underlying condition with diabetic autonomic (poly)neuropathy: Secondary | ICD-10-CM

## 2022-01-26 DIAGNOSIS — E119 Type 2 diabetes mellitus without complications: Secondary | ICD-10-CM

## 2022-01-26 NOTE — Progress Notes (Signed)
SUBJECTIVE Patient with a history of diabetes mellitus presents to office today complaining of elongated, thickened nails that cause pain while ambulating in shoes.  Patient presents for routine diabetic foot exam.  Patient is here for further evaluation and treatment.   Past Medical History:  Diagnosis Date   Anemia    Cancer ([facility]) 12/2013   prostate   Cardiogenic pulmonary edema ([facility]) 12/19/2014   Cardiomyopathy, ischemic    CHF (congestive heart failure) (HCC)    COPD (chronic obstructive pulmonary disease) (HCC)    Depression    Diabetes mellitus without complication (HCC)    GERD (gastroesophageal reflux disease)    Gout    Hypercholesteremia    Hypertension    Myocardial infarction ([facility]) [id number]   OSA on CPAP    Shortness of breath dyspnea    Past Surgical History:  Procedure Laterality Date   CARDIAC CATHETERIZATION N/A 02/02/2016   Procedure: Left Heart Cath and Coronary Angiography;  Surgeon: [name], MD;  Location: [facility];  Service: Cardiovascular;  Laterality: N/A;   CORONARY ANGIOPLASTY WITH STENT PLACEMENT     CORONARY ARTERY BYPASS GRAFT  11/24/2010   CORONARY STENT INTERVENTION N/A 09/04/2018   Procedure: CORONARY STENT INTERVENTION;  Surgeon: [name], MD;  Location: [facility];  Service: Cardiovascular;  Laterality: N/A;   IMPLANTABLE CARDIOVERTER DEFIBRILLATOR (ICD) GENERATOR CHANGE Left 12/12/2015   Procedure: DUAL LEAD PLACEMENT CARDIAC DIFIBRILLATOR;  Surgeon: [name], MD;  Location: [facility];  Service: Cardiovascular;  Laterality: Left;   LEFT HEART CATH AND CORS/GRAFTS ANGIOGRAPHY N/A 09/04/2018   Procedure: LEFT HEART CATH AND CORS/GRAFTS ANGIOGRAPHY;  Surgeon: [name], MD;  Location: [facility];  Service: Cardiovascular;  Laterality: N/A;   TONSILLECTOMY     Allergies  Allergen Reactions   Brilinta [Ticagrelor] Shortness Of Breath   Tuberculin Tests Rash   Benadryl  [Diphenhydramine] Other (See Comments)    " Hyperactivity"   Doxycycline Swelling    Pt went into pulmonary edema.   Lopid [Gemfibrozil] Swelling    "I gain 1 pound a day for 30 days."     OBJECTIVE General Patient is awake, alert, and oriented x 3 and in no acute distress. Derm Skin is dry and supple bilateral. Negative open lesions or macerations. Remaining integument unremarkable. Nails are tender, long, thickened and dystrophic with subungual debris, consistent with onychomycosis, 1-5 bilateral. No signs of infection noted. Vasc  DP and PT pedal pulses palpable bilaterally. Temperature gradient within normal limits.  Neuro Epicritic and protective threshold sensation diminished bilaterally.  Musculoskeletal Exam No symptomatic pedal deformities noted bilateral. Muscular strength within normal limits.  ASSESSMENT 1. Diabetes Mellitus w/ peripheral neuropathy 2.  Pain due to onychomycosis of toenails bilateral  PLAN OF CARE 1. Patient evaluated today.  Comprehensive diabetic foot exam performed today. 2. Instructed to maintain good pedal hygiene and foot care. Stressed importance of controlling blood sugar.  3. Mechanical debridement of nails 1-5 bilaterally performed using a nail nipper. Filed with dremel without incident.  4. Return to clinic in 3 mos.     Edrick Kins, DPM [facility]  Dr. Edrick Kins, DPM    [address], [place] [address]  Office [phone]  Fax [phone]

## 2022-01-28 DIAGNOSIS — R531 Weakness: Secondary | ICD-10-CM | POA: Diagnosis not present

## 2022-01-28 DIAGNOSIS — R5382 Chronic fatigue, unspecified: Secondary | ICD-10-CM | POA: Diagnosis not present

## 2022-01-28 DIAGNOSIS — Z515 Encounter for palliative care: Secondary | ICD-10-CM | POA: Diagnosis not present

## 2022-01-29 ENCOUNTER — Telehealth: Payer: Self-pay | Admitting: *Deleted

## 2022-01-29 DIAGNOSIS — Z7901 Long term (current) use of anticoagulants: Secondary | ICD-10-CM | POA: Diagnosis not present

## 2022-01-29 NOTE — Telephone Encounter (Signed)
"  We need medical records for Mr. Appelhans.  You can fax them to [phone]."

## 2022-01-29 NOTE — Telephone Encounter (Signed)
I faxed the requested notes to the [place].

## 2022-02-18 DIAGNOSIS — I739 Peripheral vascular disease, unspecified: Secondary | ICD-10-CM | POA: Diagnosis not present

## 2022-02-18 DIAGNOSIS — I6523 Occlusion and stenosis of bilateral carotid arteries: Secondary | ICD-10-CM | POA: Diagnosis not present

## 2022-02-26 DIAGNOSIS — Z7901 Long term (current) use of anticoagulants: Secondary | ICD-10-CM | POA: Diagnosis not present

## 2022-03-10 DIAGNOSIS — Z79899 Other long term (current) drug therapy: Secondary | ICD-10-CM | POA: Diagnosis not present

## 2022-03-10 DIAGNOSIS — E785 Hyperlipidemia, unspecified: Secondary | ICD-10-CM | POA: Diagnosis not present

## 2022-03-10 DIAGNOSIS — I130 Hypertensive heart and chronic kidney disease with heart failure and stage 1 through stage 4 chronic kidney disease, or unspecified chronic kidney disease: Secondary | ICD-10-CM | POA: Diagnosis not present

## 2022-03-10 DIAGNOSIS — I259 Chronic ischemic heart disease, unspecified: Secondary | ICD-10-CM | POA: Diagnosis not present

## 2022-03-10 DIAGNOSIS — I472 Ventricular tachycardia, unspecified: Secondary | ICD-10-CM | POA: Diagnosis not present

## 2022-03-10 DIAGNOSIS — Z7984 Long term (current) use of oral hypoglycemic drugs: Secondary | ICD-10-CM | POA: Diagnosis not present

## 2022-03-10 DIAGNOSIS — B952 Enterococcus as the cause of diseases classified elsewhere: Secondary | ICD-10-CM | POA: Diagnosis not present

## 2022-03-10 DIAGNOSIS — I5022 Chronic systolic (congestive) heart failure: Secondary | ICD-10-CM | POA: Diagnosis not present

## 2022-03-10 DIAGNOSIS — I499 Cardiac arrhythmia, unspecified: Secondary | ICD-10-CM | POA: Diagnosis not present

## 2022-03-10 DIAGNOSIS — Z794 Long term (current) use of insulin: Secondary | ICD-10-CM | POA: Diagnosis not present

## 2022-03-10 DIAGNOSIS — Z95811 Presence of heart assist device: Secondary | ICD-10-CM | POA: Diagnosis not present

## 2022-03-10 DIAGNOSIS — Z7901 Long term (current) use of anticoagulants: Secondary | ICD-10-CM | POA: Diagnosis not present

## 2022-03-10 DIAGNOSIS — Z8719 Personal history of other diseases of the digestive system: Secondary | ICD-10-CM | POA: Diagnosis not present

## 2022-03-10 DIAGNOSIS — I4901 Ventricular fibrillation: Secondary | ICD-10-CM | POA: Diagnosis not present

## 2022-03-10 DIAGNOSIS — I255 Ischemic cardiomyopathy: Secondary | ICD-10-CM | POA: Diagnosis not present

## 2022-03-10 DIAGNOSIS — D500 Iron deficiency anemia secondary to blood loss (chronic): Secondary | ICD-10-CM | POA: Diagnosis not present

## 2022-03-10 DIAGNOSIS — R9431 Abnormal electrocardiogram [ECG] [EKG]: Secondary | ICD-10-CM | POA: Diagnosis not present

## 2022-03-10 DIAGNOSIS — I5084 End stage heart failure: Secondary | ICD-10-CM | POA: Diagnosis not present

## 2022-03-15 DIAGNOSIS — E119 Type 2 diabetes mellitus without complications: Secondary | ICD-10-CM | POA: Diagnosis not present

## 2022-03-22 DIAGNOSIS — Z7951 Long term (current) use of inhaled steroids: Secondary | ICD-10-CM | POA: Diagnosis not present

## 2022-03-22 DIAGNOSIS — F3342 Major depressive disorder, recurrent, in full remission: Secondary | ICD-10-CM | POA: Diagnosis not present

## 2022-03-22 DIAGNOSIS — I5020 Unspecified systolic (congestive) heart failure: Secondary | ICD-10-CM | POA: Diagnosis not present

## 2022-03-22 DIAGNOSIS — Z6827 Body mass index (BMI) 27.0-27.9, adult: Secondary | ICD-10-CM | POA: Diagnosis not present

## 2022-03-22 DIAGNOSIS — E1151 Type 2 diabetes mellitus with diabetic peripheral angiopathy without gangrene: Secondary | ICD-10-CM | POA: Diagnosis not present

## 2022-03-22 DIAGNOSIS — Z515 Encounter for palliative care: Secondary | ICD-10-CM | POA: Diagnosis not present

## 2022-03-22 DIAGNOSIS — G4733 Obstructive sleep apnea (adult) (pediatric): Secondary | ICD-10-CM | POA: Diagnosis not present

## 2022-03-22 DIAGNOSIS — J449 Chronic obstructive pulmonary disease, unspecified: Secondary | ICD-10-CM | POA: Diagnosis not present

## 2022-03-22 DIAGNOSIS — D6869 Other thrombophilia: Secondary | ICD-10-CM | POA: Diagnosis not present

## 2022-03-22 DIAGNOSIS — E1159 Type 2 diabetes mellitus with other circulatory complications: Secondary | ICD-10-CM | POA: Diagnosis not present

## 2022-03-22 DIAGNOSIS — I2510 Atherosclerotic heart disease of native coronary artery without angina pectoris: Secondary | ICD-10-CM | POA: Diagnosis not present

## 2022-03-22 DIAGNOSIS — Z7901 Long term (current) use of anticoagulants: Secondary | ICD-10-CM | POA: Diagnosis not present

## 2022-03-26 DIAGNOSIS — Z7901 Long term (current) use of anticoagulants: Secondary | ICD-10-CM | POA: Diagnosis not present

## 2022-03-30 DIAGNOSIS — D849 Immunodeficiency, unspecified: Secondary | ICD-10-CM | POA: Diagnosis not present

## 2022-03-30 DIAGNOSIS — R7881 Bacteremia: Secondary | ICD-10-CM | POA: Diagnosis not present

## 2022-03-30 DIAGNOSIS — Z792 Long term (current) use of antibiotics: Secondary | ICD-10-CM | POA: Diagnosis not present

## 2022-03-30 DIAGNOSIS — Z87891 Personal history of nicotine dependence: Secondary | ICD-10-CM | POA: Diagnosis not present

## 2022-03-30 DIAGNOSIS — Z8616 Personal history of COVID-19: Secondary | ICD-10-CM | POA: Diagnosis not present

## 2022-03-30 DIAGNOSIS — Z7901 Long term (current) use of anticoagulants: Secondary | ICD-10-CM | POA: Diagnosis not present

## 2022-03-30 DIAGNOSIS — Z95810 Presence of automatic (implantable) cardiac defibrillator: Secondary | ICD-10-CM | POA: Diagnosis not present

## 2022-03-30 DIAGNOSIS — Z87440 Personal history of urinary (tract) infections: Secondary | ICD-10-CM | POA: Diagnosis not present

## 2022-03-30 DIAGNOSIS — B952 Enterococcus as the cause of diseases classified elsewhere: Secondary | ICD-10-CM | POA: Diagnosis not present

## 2022-03-30 DIAGNOSIS — Z8701 Personal history of pneumonia (recurrent): Secondary | ICD-10-CM | POA: Diagnosis not present

## 2022-03-30 DIAGNOSIS — Z23 Encounter for immunization: Secondary | ICD-10-CM | POA: Diagnosis not present

## 2022-03-30 DIAGNOSIS — Q2739 Arteriovenous malformation, other site: Secondary | ICD-10-CM | POA: Diagnosis not present

## 2022-04-02 DIAGNOSIS — I4901 Ventricular fibrillation: Secondary | ICD-10-CM | POA: Diagnosis not present

## 2022-04-02 DIAGNOSIS — Z4502 Encounter for adjustment and management of automatic implantable cardiac defibrillator: Secondary | ICD-10-CM | POA: Diagnosis not present

## 2022-04-02 DIAGNOSIS — Z95811 Presence of heart assist device: Secondary | ICD-10-CM | POA: Diagnosis not present

## 2022-04-02 DIAGNOSIS — I5022 Chronic systolic (congestive) heart failure: Secondary | ICD-10-CM | POA: Diagnosis not present

## 2022-04-05 DIAGNOSIS — I5022 Chronic systolic (congestive) heart failure: Secondary | ICD-10-CM | POA: Diagnosis not present

## 2022-04-05 DIAGNOSIS — I4901 Ventricular fibrillation: Secondary | ICD-10-CM | POA: Diagnosis not present

## 2022-04-05 DIAGNOSIS — Z95811 Presence of heart assist device: Secondary | ICD-10-CM | POA: Diagnosis not present

## 2022-04-05 DIAGNOSIS — Z4502 Encounter for adjustment and management of automatic implantable cardiac defibrillator: Secondary | ICD-10-CM | POA: Diagnosis not present

## 2022-04-08 DIAGNOSIS — I255 Ischemic cardiomyopathy: Secondary | ICD-10-CM | POA: Diagnosis not present

## 2022-04-08 DIAGNOSIS — Z4502 Encounter for adjustment and management of automatic implantable cardiac defibrillator: Secondary | ICD-10-CM | POA: Diagnosis not present

## 2022-04-08 DIAGNOSIS — I5022 Chronic systolic (congestive) heart failure: Secondary | ICD-10-CM | POA: Diagnosis not present

## 2022-04-08 DIAGNOSIS — I4901 Ventricular fibrillation: Secondary | ICD-10-CM | POA: Diagnosis not present

## 2022-04-08 DIAGNOSIS — Z95810 Presence of automatic (implantable) cardiac defibrillator: Secondary | ICD-10-CM | POA: Diagnosis not present

## 2022-04-08 DIAGNOSIS — Z95811 Presence of heart assist device: Secondary | ICD-10-CM | POA: Diagnosis not present

## 2022-04-08 DIAGNOSIS — I472 Ventricular tachycardia, unspecified: Secondary | ICD-10-CM | POA: Diagnosis not present

## 2022-04-12 DIAGNOSIS — I4891 Unspecified atrial fibrillation: Secondary | ICD-10-CM | POA: Diagnosis not present

## 2022-04-12 DIAGNOSIS — Z8546 Personal history of malignant neoplasm of prostate: Secondary | ICD-10-CM | POA: Diagnosis not present

## 2022-04-12 DIAGNOSIS — Z794 Long term (current) use of insulin: Secondary | ICD-10-CM | POA: Diagnosis not present

## 2022-04-12 DIAGNOSIS — Z8616 Personal history of COVID-19: Secondary | ICD-10-CM | POA: Diagnosis not present

## 2022-04-12 DIAGNOSIS — R296 Repeated falls: Secondary | ICD-10-CM | POA: Diagnosis not present

## 2022-04-12 DIAGNOSIS — N183 Chronic kidney disease, stage 3 unspecified: Secondary | ICD-10-CM | POA: Diagnosis not present

## 2022-04-12 DIAGNOSIS — R0602 Shortness of breath: Secondary | ICD-10-CM | POA: Diagnosis not present

## 2022-04-12 DIAGNOSIS — Y93H2 Activity, gardening and landscaping: Secondary | ICD-10-CM | POA: Diagnosis not present

## 2022-04-12 DIAGNOSIS — Z79899 Other long term (current) drug therapy: Secondary | ICD-10-CM | POA: Diagnosis not present

## 2022-04-12 DIAGNOSIS — E1122 Type 2 diabetes mellitus with diabetic chronic kidney disease: Secondary | ICD-10-CM | POA: Diagnosis not present

## 2022-04-12 DIAGNOSIS — W19XXXA Unspecified fall, initial encounter: Secondary | ICD-10-CM | POA: Diagnosis not present

## 2022-04-12 DIAGNOSIS — Z7901 Long term (current) use of anticoagulants: Secondary | ICD-10-CM | POA: Diagnosis not present

## 2022-04-12 DIAGNOSIS — S0990XA Unspecified injury of head, initial encounter: Secondary | ICD-10-CM | POA: Diagnosis not present

## 2022-04-12 DIAGNOSIS — R9431 Abnormal electrocardiogram [ECG] [EKG]: Secondary | ICD-10-CM | POA: Diagnosis not present

## 2022-04-12 DIAGNOSIS — S299XXA Unspecified injury of thorax, initial encounter: Secondary | ICD-10-CM | POA: Diagnosis not present

## 2022-04-12 DIAGNOSIS — I5023 Acute on chronic systolic (congestive) heart failure: Secondary | ICD-10-CM | POA: Diagnosis not present

## 2022-04-12 DIAGNOSIS — I472 Ventricular tachycardia, unspecified: Secondary | ICD-10-CM | POA: Diagnosis not present

## 2022-04-12 DIAGNOSIS — E785 Hyperlipidemia, unspecified: Secondary | ICD-10-CM | POA: Diagnosis not present

## 2022-04-12 DIAGNOSIS — Z955 Presence of coronary angioplasty implant and graft: Secondary | ICD-10-CM | POA: Diagnosis not present

## 2022-04-12 DIAGNOSIS — E1151 Type 2 diabetes mellitus with diabetic peripheral angiopathy without gangrene: Secondary | ICD-10-CM | POA: Diagnosis not present

## 2022-04-12 DIAGNOSIS — J449 Chronic obstructive pulmonary disease, unspecified: Secondary | ICD-10-CM | POA: Diagnosis not present

## 2022-04-12 DIAGNOSIS — I130 Hypertensive heart and chronic kidney disease with heart failure and stage 1 through stage 4 chronic kidney disease, or unspecified chronic kidney disease: Secondary | ICD-10-CM | POA: Diagnosis not present

## 2022-04-12 DIAGNOSIS — R531 Weakness: Secondary | ICD-10-CM | POA: Diagnosis not present

## 2022-04-12 DIAGNOSIS — E1142 Type 2 diabetes mellitus with diabetic polyneuropathy: Secondary | ICD-10-CM | POA: Diagnosis not present

## 2022-04-12 DIAGNOSIS — I951 Orthostatic hypotension: Secondary | ICD-10-CM | POA: Diagnosis not present

## 2022-04-12 DIAGNOSIS — J90 Pleural effusion, not elsewhere classified: Secondary | ICD-10-CM | POA: Diagnosis not present

## 2022-04-12 DIAGNOSIS — G4733 Obstructive sleep apnea (adult) (pediatric): Secondary | ICD-10-CM | POA: Diagnosis not present

## 2022-04-12 DIAGNOSIS — D509 Iron deficiency anemia, unspecified: Secondary | ICD-10-CM | POA: Diagnosis not present

## 2022-04-12 DIAGNOSIS — Z95811 Presence of heart assist device: Secondary | ICD-10-CM | POA: Diagnosis not present

## 2022-04-12 DIAGNOSIS — I70212 Atherosclerosis of native arteries of extremities with intermittent claudication, left leg: Secondary | ICD-10-CM | POA: Diagnosis not present

## 2022-04-12 DIAGNOSIS — I255 Ischemic cardiomyopathy: Secondary | ICD-10-CM | POA: Diagnosis not present

## 2022-04-12 DIAGNOSIS — I5084 End stage heart failure: Secondary | ICD-10-CM | POA: Diagnosis not present

## 2022-04-12 DIAGNOSIS — I519 Heart disease, unspecified: Secondary | ICD-10-CM | POA: Diagnosis not present

## 2022-04-12 DIAGNOSIS — N179 Acute kidney failure, unspecified: Secondary | ICD-10-CM | POA: Diagnosis not present

## 2022-04-19 DIAGNOSIS — Z6827 Body mass index (BMI) 27.0-27.9, adult: Secondary | ICD-10-CM | POA: Diagnosis not present

## 2022-04-19 DIAGNOSIS — I5020 Unspecified systolic (congestive) heart failure: Secondary | ICD-10-CM | POA: Diagnosis not present

## 2022-04-19 DIAGNOSIS — Z95811 Presence of heart assist device: Secondary | ICD-10-CM | POA: Diagnosis not present

## 2022-04-19 DIAGNOSIS — Z515 Encounter for palliative care: Secondary | ICD-10-CM | POA: Diagnosis not present

## 2022-04-21 DIAGNOSIS — Z95810 Presence of automatic (implantable) cardiac defibrillator: Secondary | ICD-10-CM | POA: Diagnosis not present

## 2022-04-21 DIAGNOSIS — I130 Hypertensive heart and chronic kidney disease with heart failure and stage 1 through stage 4 chronic kidney disease, or unspecified chronic kidney disease: Secondary | ICD-10-CM | POA: Diagnosis not present

## 2022-04-21 DIAGNOSIS — I5022 Chronic systolic (congestive) heart failure: Secondary | ICD-10-CM | POA: Diagnosis not present

## 2022-04-21 DIAGNOSIS — N183 Chronic kidney disease, stage 3 unspecified: Secondary | ICD-10-CM | POA: Diagnosis not present

## 2022-04-21 DIAGNOSIS — I50812 Chronic right heart failure: Secondary | ICD-10-CM | POA: Diagnosis not present

## 2022-04-21 DIAGNOSIS — R296 Repeated falls: Secondary | ICD-10-CM | POA: Diagnosis not present

## 2022-04-21 DIAGNOSIS — I255 Ischemic cardiomyopathy: Secondary | ICD-10-CM | POA: Diagnosis not present

## 2022-04-21 DIAGNOSIS — E1122 Type 2 diabetes mellitus with diabetic chronic kidney disease: Secondary | ICD-10-CM | POA: Diagnosis not present

## 2022-04-21 DIAGNOSIS — Z7984 Long term (current) use of oral hypoglycemic drugs: Secondary | ICD-10-CM | POA: Diagnosis not present

## 2022-04-21 DIAGNOSIS — Z7901 Long term (current) use of anticoagulants: Secondary | ICD-10-CM | POA: Diagnosis not present

## 2022-04-22 DIAGNOSIS — I5022 Chronic systolic (congestive) heart failure: Secondary | ICD-10-CM | POA: Diagnosis not present

## 2022-04-25 DIAGNOSIS — Z7901 Long term (current) use of anticoagulants: Secondary | ICD-10-CM | POA: Diagnosis not present

## 2022-04-29 DIAGNOSIS — D508 Other iron deficiency anemias: Secondary | ICD-10-CM | POA: Diagnosis not present

## 2022-04-29 DIAGNOSIS — Z9181 History of falling: Secondary | ICD-10-CM | POA: Diagnosis not present

## 2022-04-29 DIAGNOSIS — C61 Malignant neoplasm of prostate: Secondary | ICD-10-CM | POA: Diagnosis not present

## 2022-04-29 DIAGNOSIS — I5022 Chronic systolic (congestive) heart failure: Secondary | ICD-10-CM | POA: Diagnosis not present

## 2022-05-05 DIAGNOSIS — N183 Chronic kidney disease, stage 3 unspecified: Secondary | ICD-10-CM | POA: Diagnosis not present

## 2022-05-05 DIAGNOSIS — I130 Hypertensive heart and chronic kidney disease with heart failure and stage 1 through stage 4 chronic kidney disease, or unspecified chronic kidney disease: Secondary | ICD-10-CM | POA: Diagnosis not present

## 2022-05-05 DIAGNOSIS — I50812 Chronic right heart failure: Secondary | ICD-10-CM | POA: Diagnosis not present

## 2022-05-05 DIAGNOSIS — E1122 Type 2 diabetes mellitus with diabetic chronic kidney disease: Secondary | ICD-10-CM | POA: Diagnosis not present

## 2022-05-05 DIAGNOSIS — Z7984 Long term (current) use of oral hypoglycemic drugs: Secondary | ICD-10-CM | POA: Diagnosis not present

## 2022-05-05 DIAGNOSIS — I5022 Chronic systolic (congestive) heart failure: Secondary | ICD-10-CM | POA: Diagnosis not present

## 2022-05-05 DIAGNOSIS — R296 Repeated falls: Secondary | ICD-10-CM | POA: Diagnosis not present

## 2022-05-07 DIAGNOSIS — I255 Ischemic cardiomyopathy: Secondary | ICD-10-CM | POA: Diagnosis not present

## 2022-05-07 DIAGNOSIS — I50812 Chronic right heart failure: Secondary | ICD-10-CM | POA: Diagnosis not present

## 2022-05-07 DIAGNOSIS — D692 Other nonthrombocytopenic purpura: Secondary | ICD-10-CM | POA: Diagnosis not present

## 2022-05-07 DIAGNOSIS — D6869 Other thrombophilia: Secondary | ICD-10-CM | POA: Diagnosis not present

## 2022-05-07 DIAGNOSIS — R296 Repeated falls: Secondary | ICD-10-CM | POA: Diagnosis not present

## 2022-05-07 DIAGNOSIS — Z515 Encounter for palliative care: Secondary | ICD-10-CM | POA: Diagnosis not present

## 2022-05-07 DIAGNOSIS — I5020 Unspecified systolic (congestive) heart failure: Secondary | ICD-10-CM | POA: Diagnosis not present

## 2022-05-07 DIAGNOSIS — F3342 Major depressive disorder, recurrent, in full remission: Secondary | ICD-10-CM | POA: Diagnosis not present

## 2022-05-07 DIAGNOSIS — Z95810 Presence of automatic (implantable) cardiac defibrillator: Secondary | ICD-10-CM | POA: Diagnosis not present

## 2022-05-07 DIAGNOSIS — I25118 Atherosclerotic heart disease of native coronary artery with other forms of angina pectoris: Secondary | ICD-10-CM | POA: Diagnosis not present

## 2022-05-07 DIAGNOSIS — E1122 Type 2 diabetes mellitus with diabetic chronic kidney disease: Secondary | ICD-10-CM | POA: Diagnosis not present

## 2022-05-07 DIAGNOSIS — Z7951 Long term (current) use of inhaled steroids: Secondary | ICD-10-CM | POA: Diagnosis not present

## 2022-05-07 DIAGNOSIS — J449 Chronic obstructive pulmonary disease, unspecified: Secondary | ICD-10-CM | POA: Diagnosis not present

## 2022-05-07 DIAGNOSIS — I739 Peripheral vascular disease, unspecified: Secondary | ICD-10-CM | POA: Diagnosis not present

## 2022-05-07 DIAGNOSIS — Z7901 Long term (current) use of anticoagulants: Secondary | ICD-10-CM | POA: Diagnosis not present

## 2022-05-07 DIAGNOSIS — G4733 Obstructive sleep apnea (adult) (pediatric): Secondary | ICD-10-CM | POA: Diagnosis not present

## 2022-05-07 DIAGNOSIS — N183 Chronic kidney disease, stage 3 unspecified: Secondary | ICD-10-CM | POA: Diagnosis not present

## 2022-05-07 DIAGNOSIS — Z7984 Long term (current) use of oral hypoglycemic drugs: Secondary | ICD-10-CM | POA: Diagnosis not present

## 2022-05-07 DIAGNOSIS — Z6828 Body mass index (BMI) 28.0-28.9, adult: Secondary | ICD-10-CM | POA: Diagnosis not present

## 2022-05-07 DIAGNOSIS — I130 Hypertensive heart and chronic kidney disease with heart failure and stage 1 through stage 4 chronic kidney disease, or unspecified chronic kidney disease: Secondary | ICD-10-CM | POA: Diagnosis not present

## 2022-05-07 DIAGNOSIS — I5022 Chronic systolic (congestive) heart failure: Secondary | ICD-10-CM | POA: Diagnosis not present

## 2022-05-11 ENCOUNTER — Other Ambulatory Visit: Payer: PPO

## 2022-05-11 DIAGNOSIS — Z515 Encounter for palliative care: Secondary | ICD-10-CM

## 2022-05-11 NOTE — Progress Notes (Signed)
PATIENT NAME: [name] DOB: 1946-01-31 MRN: [id number]  PRIMARY CARE PROVIDER: [name], MD  RESPONSIBLE PARTY:  Acct ID - Guarantor Home Phone Work Phone Relationship Acct Type  [id number] - Knies,J* [id number]  Self P/F     [place], [place],  [id number]    Connected with patient by telephone to complete a check in.  Appetite:  Patient endorses a good appetite.  CHF:  Patient continues with LVAD and reports no issues. He is weighing himself daily although he did not weigh this am.  Yesterday's  weight was 177 lbs.  He believes his discharge weight from the ED last month was 181 lbs.    Caregiver Fatigue:  Patient endorses caring for his bed bound wife.  He continues to have the support of his son but reports doing a lot of hands on care for his wife.  Diabetes:  Patient is currently out of diabetic test strips.  He has contacted [facility] for refills.  Patient advises he was not taking his blood sugars 2 x daily.   Blood sugars range from 80-100 in past readings.   Falls:  Patient was seen in the ED last month due to a fall.  He reports being in his garden when his legs gave out.  No injuries were reported.  Patient notes lower extremity weakness has worsened.  He recently started with physical therapy and occupational therapy.  Patient notes some strengthening of legs since the start of therapy.  He describes getting different "hints" from the therapist to help stay safe when ambulating.  He is now keeping his walker with him when out in the yard and notes his stability is much better.   No recent falls reported by patient.  Palliative Care will continue with check-ins every 2-3 months per patient/son request.    Lorenza Burton, RN

## 2022-05-13 ENCOUNTER — Telehealth: Payer: Self-pay

## 2022-05-13 NOTE — Telephone Encounter (Signed)
850 am.  Follow up call made to [facility] to check on the status of glucometer test strips.  Spoke with Theadora Rama who advised that patient received 90 day supply of lancets/test strips on April 14th and is not due for another shipment until July 10th.   Follow up call made to patient and he advised he was instructed to obtain blood sugars 2x daily by Dr. Gabriel Carina and but [facility] only has orders for testing 1 x daily.  Advised that I would follow up with Dr. Joycie Peek office to see if an order could be sent to [facility] to reflect blood sugar checks 2 x daily.   Spoke with Santiago Glad at Dr. Joycie Peek office and advised of above need.  She will request a new order be faxed to [facility].

## 2022-05-17 DIAGNOSIS — E119 Type 2 diabetes mellitus without complications: Secondary | ICD-10-CM | POA: Diagnosis not present

## 2022-05-28 DIAGNOSIS — Z7901 Long term (current) use of anticoagulants: Secondary | ICD-10-CM | POA: Diagnosis not present

## 2022-06-11 DIAGNOSIS — I50812 Chronic right heart failure: Secondary | ICD-10-CM | POA: Diagnosis not present

## 2022-06-11 DIAGNOSIS — N183 Chronic kidney disease, stage 3 unspecified: Secondary | ICD-10-CM | POA: Diagnosis not present

## 2022-06-11 DIAGNOSIS — I5022 Chronic systolic (congestive) heart failure: Secondary | ICD-10-CM | POA: Diagnosis not present

## 2022-06-11 DIAGNOSIS — I130 Hypertensive heart and chronic kidney disease with heart failure and stage 1 through stage 4 chronic kidney disease, or unspecified chronic kidney disease: Secondary | ICD-10-CM | POA: Diagnosis not present

## 2022-06-11 DIAGNOSIS — Z7901 Long term (current) use of anticoagulants: Secondary | ICD-10-CM | POA: Diagnosis not present

## 2022-06-11 DIAGNOSIS — R296 Repeated falls: Secondary | ICD-10-CM | POA: Diagnosis not present

## 2022-06-11 DIAGNOSIS — I255 Ischemic cardiomyopathy: Secondary | ICD-10-CM | POA: Diagnosis not present

## 2022-06-11 DIAGNOSIS — Z95810 Presence of automatic (implantable) cardiac defibrillator: Secondary | ICD-10-CM | POA: Diagnosis not present

## 2022-06-11 DIAGNOSIS — Z7984 Long term (current) use of oral hypoglycemic drugs: Secondary | ICD-10-CM | POA: Diagnosis not present

## 2022-06-11 DIAGNOSIS — E1122 Type 2 diabetes mellitus with diabetic chronic kidney disease: Secondary | ICD-10-CM | POA: Diagnosis not present

## 2022-06-14 DIAGNOSIS — E119 Type 2 diabetes mellitus without complications: Secondary | ICD-10-CM | POA: Diagnosis not present

## 2022-06-15 DIAGNOSIS — Z951 Presence of aortocoronary bypass graft: Secondary | ICD-10-CM | POA: Diagnosis not present

## 2022-06-15 DIAGNOSIS — Z95810 Presence of automatic (implantable) cardiac defibrillator: Secondary | ICD-10-CM | POA: Diagnosis not present

## 2022-06-15 DIAGNOSIS — Z7901 Long term (current) use of anticoagulants: Secondary | ICD-10-CM | POA: Diagnosis not present

## 2022-06-15 DIAGNOSIS — I130 Hypertensive heart and chronic kidney disease with heart failure and stage 1 through stage 4 chronic kidney disease, or unspecified chronic kidney disease: Secondary | ICD-10-CM | POA: Diagnosis not present

## 2022-06-15 DIAGNOSIS — Z888 Allergy status to other drugs, medicaments and biological substances status: Secondary | ICD-10-CM | POA: Diagnosis not present

## 2022-06-15 DIAGNOSIS — G4733 Obstructive sleep apnea (adult) (pediatric): Secondary | ICD-10-CM | POA: Diagnosis not present

## 2022-06-15 DIAGNOSIS — Z9989 Dependence on other enabling machines and devices: Secondary | ICD-10-CM | POA: Diagnosis not present

## 2022-06-15 DIAGNOSIS — Z881 Allergy status to other antibiotic agents status: Secondary | ICD-10-CM | POA: Diagnosis not present

## 2022-06-15 DIAGNOSIS — E1122 Type 2 diabetes mellitus with diabetic chronic kidney disease: Secondary | ICD-10-CM | POA: Diagnosis not present

## 2022-06-15 DIAGNOSIS — N183 Chronic kidney disease, stage 3 unspecified: Secondary | ICD-10-CM | POA: Diagnosis not present

## 2022-06-15 DIAGNOSIS — I739 Peripheral vascular disease, unspecified: Secondary | ICD-10-CM | POA: Diagnosis not present

## 2022-06-15 DIAGNOSIS — I2510 Atherosclerotic heart disease of native coronary artery without angina pectoris: Secondary | ICD-10-CM | POA: Diagnosis not present

## 2022-06-15 DIAGNOSIS — Z79899 Other long term (current) drug therapy: Secondary | ICD-10-CM | POA: Diagnosis not present

## 2022-06-15 DIAGNOSIS — G4730 Sleep apnea, unspecified: Secondary | ICD-10-CM | POA: Diagnosis not present

## 2022-06-15 DIAGNOSIS — I255 Ischemic cardiomyopathy: Secondary | ICD-10-CM | POA: Diagnosis not present

## 2022-06-15 DIAGNOSIS — I5020 Unspecified systolic (congestive) heart failure: Secondary | ICD-10-CM | POA: Diagnosis not present

## 2022-06-15 DIAGNOSIS — I951 Orthostatic hypotension: Secondary | ICD-10-CM | POA: Diagnosis not present

## 2022-06-15 DIAGNOSIS — I70212 Atherosclerosis of native arteries of extremities with intermittent claudication, left leg: Secondary | ICD-10-CM | POA: Diagnosis not present

## 2022-06-15 DIAGNOSIS — I6523 Occlusion and stenosis of bilateral carotid arteries: Secondary | ICD-10-CM | POA: Diagnosis not present

## 2022-06-15 DIAGNOSIS — Z4502 Encounter for adjustment and management of automatic implantable cardiac defibrillator: Secondary | ICD-10-CM | POA: Diagnosis not present

## 2022-06-15 DIAGNOSIS — H269 Unspecified cataract: Secondary | ICD-10-CM | POA: Diagnosis not present

## 2022-06-15 DIAGNOSIS — D696 Thrombocytopenia, unspecified: Secondary | ICD-10-CM | POA: Diagnosis not present

## 2022-06-15 DIAGNOSIS — E1151 Type 2 diabetes mellitus with diabetic peripheral angiopathy without gangrene: Secondary | ICD-10-CM | POA: Diagnosis not present

## 2022-06-15 DIAGNOSIS — T82111A Breakdown (mechanical) of cardiac pulse generator (battery), initial encounter: Secondary | ICD-10-CM | POA: Diagnosis not present

## 2022-06-15 DIAGNOSIS — I5022 Chronic systolic (congestive) heart failure: Secondary | ICD-10-CM | POA: Diagnosis not present

## 2022-06-15 DIAGNOSIS — Z87891 Personal history of nicotine dependence: Secondary | ICD-10-CM | POA: Diagnosis not present

## 2022-06-15 DIAGNOSIS — K219 Gastro-esophageal reflux disease without esophagitis: Secondary | ICD-10-CM | POA: Diagnosis not present

## 2022-06-15 DIAGNOSIS — Z8616 Personal history of COVID-19: Secondary | ICD-10-CM | POA: Diagnosis not present

## 2022-06-15 DIAGNOSIS — J449 Chronic obstructive pulmonary disease, unspecified: Secondary | ICD-10-CM | POA: Diagnosis not present

## 2022-06-16 DIAGNOSIS — J811 Chronic pulmonary edema: Secondary | ICD-10-CM | POA: Diagnosis not present

## 2022-06-16 DIAGNOSIS — Z452 Encounter for adjustment and management of vascular access device: Secondary | ICD-10-CM | POA: Diagnosis not present

## 2022-06-16 DIAGNOSIS — Z95810 Presence of automatic (implantable) cardiac defibrillator: Secondary | ICD-10-CM | POA: Diagnosis not present

## 2022-06-21 DIAGNOSIS — I472 Ventricular tachycardia, unspecified: Secondary | ICD-10-CM | POA: Diagnosis not present

## 2022-06-21 DIAGNOSIS — N183 Chronic kidney disease, stage 3 unspecified: Secondary | ICD-10-CM | POA: Diagnosis not present

## 2022-06-21 DIAGNOSIS — I5084 End stage heart failure: Secondary | ICD-10-CM | POA: Diagnosis not present

## 2022-06-21 DIAGNOSIS — E785 Hyperlipidemia, unspecified: Secondary | ICD-10-CM | POA: Diagnosis not present

## 2022-06-21 DIAGNOSIS — I255 Ischemic cardiomyopathy: Secondary | ICD-10-CM | POA: Diagnosis not present

## 2022-06-21 DIAGNOSIS — Z4502 Encounter for adjustment and management of automatic implantable cardiac defibrillator: Secondary | ICD-10-CM | POA: Diagnosis not present

## 2022-06-21 DIAGNOSIS — Z7901 Long term (current) use of anticoagulants: Secondary | ICD-10-CM | POA: Diagnosis not present

## 2022-06-21 DIAGNOSIS — Z95811 Presence of heart assist device: Secondary | ICD-10-CM | POA: Diagnosis not present

## 2022-06-21 DIAGNOSIS — R9431 Abnormal electrocardiogram [ECG] [EKG]: Secondary | ICD-10-CM | POA: Diagnosis not present

## 2022-06-21 DIAGNOSIS — I130 Hypertensive heart and chronic kidney disease with heart failure and stage 1 through stage 4 chronic kidney disease, or unspecified chronic kidney disease: Secondary | ICD-10-CM | POA: Diagnosis not present

## 2022-06-21 DIAGNOSIS — I4901 Ventricular fibrillation: Secondary | ICD-10-CM | POA: Diagnosis not present

## 2022-06-21 DIAGNOSIS — I5032 Chronic diastolic (congestive) heart failure: Secondary | ICD-10-CM | POA: Diagnosis not present

## 2022-06-21 DIAGNOSIS — I499 Cardiac arrhythmia, unspecified: Secondary | ICD-10-CM | POA: Diagnosis not present

## 2022-06-21 DIAGNOSIS — I447 Left bundle-branch block, unspecified: Secondary | ICD-10-CM | POA: Diagnosis not present

## 2022-06-21 DIAGNOSIS — I5022 Chronic systolic (congestive) heart failure: Secondary | ICD-10-CM | POA: Diagnosis not present

## 2022-06-21 DIAGNOSIS — Z79899 Other long term (current) drug therapy: Secondary | ICD-10-CM | POA: Diagnosis not present

## 2022-06-24 DIAGNOSIS — I5022 Chronic systolic (congestive) heart failure: Secondary | ICD-10-CM | POA: Diagnosis not present

## 2022-06-24 DIAGNOSIS — I2510 Atherosclerotic heart disease of native coronary artery without angina pectoris: Secondary | ICD-10-CM | POA: Diagnosis not present

## 2022-06-24 DIAGNOSIS — Z0000 Encounter for general adult medical examination without abnormal findings: Secondary | ICD-10-CM | POA: Diagnosis not present

## 2022-06-24 DIAGNOSIS — E1159 Type 2 diabetes mellitus with other circulatory complications: Secondary | ICD-10-CM | POA: Diagnosis not present

## 2022-06-24 DIAGNOSIS — D508 Other iron deficiency anemias: Secondary | ICD-10-CM | POA: Diagnosis not present

## 2022-06-25 DIAGNOSIS — Z7901 Long term (current) use of anticoagulants: Secondary | ICD-10-CM | POA: Diagnosis not present

## 2022-06-29 DIAGNOSIS — E1122 Type 2 diabetes mellitus with diabetic chronic kidney disease: Secondary | ICD-10-CM | POA: Diagnosis not present

## 2022-06-29 DIAGNOSIS — I5022 Chronic systolic (congestive) heart failure: Secondary | ICD-10-CM | POA: Diagnosis not present

## 2022-06-29 DIAGNOSIS — I130 Hypertensive heart and chronic kidney disease with heart failure and stage 1 through stage 4 chronic kidney disease, or unspecified chronic kidney disease: Secondary | ICD-10-CM | POA: Diagnosis not present

## 2022-06-29 DIAGNOSIS — R296 Repeated falls: Secondary | ICD-10-CM | POA: Diagnosis not present

## 2022-06-29 DIAGNOSIS — N183 Chronic kidney disease, stage 3 unspecified: Secondary | ICD-10-CM | POA: Diagnosis not present

## 2022-06-29 DIAGNOSIS — Z7984 Long term (current) use of oral hypoglycemic drugs: Secondary | ICD-10-CM | POA: Diagnosis not present

## 2022-06-29 DIAGNOSIS — I50812 Chronic right heart failure: Secondary | ICD-10-CM | POA: Diagnosis not present

## 2022-06-30 DIAGNOSIS — E1159 Type 2 diabetes mellitus with other circulatory complications: Secondary | ICD-10-CM | POA: Diagnosis not present

## 2022-06-30 DIAGNOSIS — D692 Other nonthrombocytopenic purpura: Secondary | ICD-10-CM | POA: Diagnosis not present

## 2022-06-30 DIAGNOSIS — Z95811 Presence of heart assist device: Secondary | ICD-10-CM | POA: Diagnosis not present

## 2022-06-30 DIAGNOSIS — Z7901 Long term (current) use of anticoagulants: Secondary | ICD-10-CM | POA: Diagnosis not present

## 2022-06-30 DIAGNOSIS — Z515 Encounter for palliative care: Secondary | ICD-10-CM | POA: Diagnosis not present

## 2022-06-30 DIAGNOSIS — I509 Heart failure, unspecified: Secondary | ICD-10-CM | POA: Diagnosis not present

## 2022-07-02 DIAGNOSIS — Z95811 Presence of heart assist device: Secondary | ICD-10-CM | POA: Diagnosis not present

## 2022-07-02 DIAGNOSIS — Z4502 Encounter for adjustment and management of automatic implantable cardiac defibrillator: Secondary | ICD-10-CM | POA: Diagnosis not present

## 2022-07-02 DIAGNOSIS — I5022 Chronic systolic (congestive) heart failure: Secondary | ICD-10-CM | POA: Diagnosis not present

## 2022-07-02 DIAGNOSIS — I4901 Ventricular fibrillation: Secondary | ICD-10-CM | POA: Diagnosis not present

## 2022-07-03 DIAGNOSIS — Z95811 Presence of heart assist device: Secondary | ICD-10-CM | POA: Diagnosis not present

## 2022-07-03 DIAGNOSIS — Z4502 Encounter for adjustment and management of automatic implantable cardiac defibrillator: Secondary | ICD-10-CM | POA: Diagnosis not present

## 2022-07-03 DIAGNOSIS — I5022 Chronic systolic (congestive) heart failure: Secondary | ICD-10-CM | POA: Diagnosis not present

## 2022-07-03 DIAGNOSIS — I4901 Ventricular fibrillation: Secondary | ICD-10-CM | POA: Diagnosis not present

## 2022-07-07 DIAGNOSIS — E1122 Type 2 diabetes mellitus with diabetic chronic kidney disease: Secondary | ICD-10-CM | POA: Diagnosis not present

## 2022-07-07 DIAGNOSIS — Z7984 Long term (current) use of oral hypoglycemic drugs: Secondary | ICD-10-CM | POA: Diagnosis not present

## 2022-07-07 DIAGNOSIS — Z95810 Presence of automatic (implantable) cardiac defibrillator: Secondary | ICD-10-CM | POA: Diagnosis not present

## 2022-07-07 DIAGNOSIS — I130 Hypertensive heart and chronic kidney disease with heart failure and stage 1 through stage 4 chronic kidney disease, or unspecified chronic kidney disease: Secondary | ICD-10-CM | POA: Diagnosis not present

## 2022-07-07 DIAGNOSIS — R296 Repeated falls: Secondary | ICD-10-CM | POA: Diagnosis not present

## 2022-07-07 DIAGNOSIS — N183 Chronic kidney disease, stage 3 unspecified: Secondary | ICD-10-CM | POA: Diagnosis not present

## 2022-07-07 DIAGNOSIS — I50812 Chronic right heart failure: Secondary | ICD-10-CM | POA: Diagnosis not present

## 2022-07-07 DIAGNOSIS — Z7901 Long term (current) use of anticoagulants: Secondary | ICD-10-CM | POA: Diagnosis not present

## 2022-07-07 DIAGNOSIS — I5022 Chronic systolic (congestive) heart failure: Secondary | ICD-10-CM | POA: Diagnosis not present

## 2022-07-07 DIAGNOSIS — I255 Ischemic cardiomyopathy: Secondary | ICD-10-CM | POA: Diagnosis not present

## 2022-07-23 DIAGNOSIS — Z125 Encounter for screening for malignant neoplasm of prostate: Secondary | ICD-10-CM | POA: Diagnosis not present

## 2022-07-23 DIAGNOSIS — Z7901 Long term (current) use of anticoagulants: Secondary | ICD-10-CM | POA: Diagnosis not present

## 2022-07-23 DIAGNOSIS — R399 Unspecified symptoms and signs involving the genitourinary system: Secondary | ICD-10-CM | POA: Diagnosis not present

## 2022-07-23 DIAGNOSIS — C61 Malignant neoplasm of prostate: Secondary | ICD-10-CM | POA: Diagnosis not present

## 2022-07-30 DIAGNOSIS — I5022 Chronic systolic (congestive) heart failure: Secondary | ICD-10-CM | POA: Diagnosis not present

## 2022-07-30 DIAGNOSIS — J189 Pneumonia, unspecified organism: Secondary | ICD-10-CM | POA: Diagnosis not present

## 2022-08-03 DIAGNOSIS — I739 Peripheral vascular disease, unspecified: Secondary | ICD-10-CM | POA: Diagnosis not present

## 2022-08-03 DIAGNOSIS — Z794 Long term (current) use of insulin: Secondary | ICD-10-CM | POA: Diagnosis not present

## 2022-08-03 DIAGNOSIS — N1832 Chronic kidney disease, stage 3b: Secondary | ICD-10-CM | POA: Diagnosis not present

## 2022-08-03 DIAGNOSIS — E1159 Type 2 diabetes mellitus with other circulatory complications: Secondary | ICD-10-CM | POA: Diagnosis not present

## 2022-08-03 DIAGNOSIS — E1122 Type 2 diabetes mellitus with diabetic chronic kidney disease: Secondary | ICD-10-CM | POA: Diagnosis not present

## 2022-08-03 DIAGNOSIS — E1142 Type 2 diabetes mellitus with diabetic polyneuropathy: Secondary | ICD-10-CM | POA: Diagnosis not present

## 2022-08-12 DIAGNOSIS — E1142 Type 2 diabetes mellitus with diabetic polyneuropathy: Secondary | ICD-10-CM | POA: Diagnosis not present

## 2022-08-12 DIAGNOSIS — I50812 Chronic right heart failure: Secondary | ICD-10-CM | POA: Diagnosis not present

## 2022-08-12 DIAGNOSIS — Z7984 Long term (current) use of oral hypoglycemic drugs: Secondary | ICD-10-CM | POA: Diagnosis not present

## 2022-08-12 DIAGNOSIS — I255 Ischemic cardiomyopathy: Secondary | ICD-10-CM | POA: Diagnosis not present

## 2022-08-12 DIAGNOSIS — Z9981 Dependence on supplemental oxygen: Secondary | ICD-10-CM | POA: Diagnosis not present

## 2022-08-12 DIAGNOSIS — N183 Chronic kidney disease, stage 3 unspecified: Secondary | ICD-10-CM | POA: Diagnosis not present

## 2022-08-12 DIAGNOSIS — I130 Hypertensive heart and chronic kidney disease with heart failure and stage 1 through stage 4 chronic kidney disease, or unspecified chronic kidney disease: Secondary | ICD-10-CM | POA: Diagnosis not present

## 2022-08-12 DIAGNOSIS — R296 Repeated falls: Secondary | ICD-10-CM | POA: Diagnosis not present

## 2022-08-12 DIAGNOSIS — I5022 Chronic systolic (congestive) heart failure: Secondary | ICD-10-CM | POA: Diagnosis not present

## 2022-08-12 DIAGNOSIS — E1122 Type 2 diabetes mellitus with diabetic chronic kidney disease: Secondary | ICD-10-CM | POA: Diagnosis not present

## 2022-08-20 DIAGNOSIS — Z7901 Long term (current) use of anticoagulants: Secondary | ICD-10-CM | POA: Diagnosis not present

## 2022-08-24 DIAGNOSIS — Z8679 Personal history of other diseases of the circulatory system: Secondary | ICD-10-CM | POA: Diagnosis not present

## 2022-08-24 DIAGNOSIS — Z95810 Presence of automatic (implantable) cardiac defibrillator: Secondary | ICD-10-CM | POA: Diagnosis not present

## 2022-08-24 DIAGNOSIS — E1151 Type 2 diabetes mellitus with diabetic peripheral angiopathy without gangrene: Secondary | ICD-10-CM | POA: Diagnosis not present

## 2022-08-24 DIAGNOSIS — E441 Mild protein-calorie malnutrition: Secondary | ICD-10-CM | POA: Diagnosis not present

## 2022-08-24 DIAGNOSIS — I509 Heart failure, unspecified: Secondary | ICD-10-CM | POA: Diagnosis not present

## 2022-08-24 DIAGNOSIS — Z515 Encounter for palliative care: Secondary | ICD-10-CM | POA: Diagnosis not present

## 2022-08-24 DIAGNOSIS — I2510 Atherosclerotic heart disease of native coronary artery without angina pectoris: Secondary | ICD-10-CM | POA: Diagnosis not present

## 2022-08-24 DIAGNOSIS — Z95811 Presence of heart assist device: Secondary | ICD-10-CM | POA: Diagnosis not present

## 2022-08-24 DIAGNOSIS — Z6825 Body mass index (BMI) 25.0-25.9, adult: Secondary | ICD-10-CM | POA: Diagnosis not present

## 2022-08-24 DIAGNOSIS — Z7982 Long term (current) use of aspirin: Secondary | ICD-10-CM | POA: Diagnosis not present

## 2022-08-24 DIAGNOSIS — D692 Other nonthrombocytopenic purpura: Secondary | ICD-10-CM | POA: Diagnosis not present

## 2022-08-24 DIAGNOSIS — Z951 Presence of aortocoronary bypass graft: Secondary | ICD-10-CM | POA: Diagnosis not present

## 2022-08-30 DIAGNOSIS — J189 Pneumonia, unspecified organism: Secondary | ICD-10-CM | POA: Diagnosis not present

## 2022-08-30 DIAGNOSIS — I5022 Chronic systolic (congestive) heart failure: Secondary | ICD-10-CM | POA: Diagnosis not present

## 2022-09-07 DIAGNOSIS — I130 Hypertensive heart and chronic kidney disease with heart failure and stage 1 through stage 4 chronic kidney disease, or unspecified chronic kidney disease: Secondary | ICD-10-CM | POA: Diagnosis not present

## 2022-09-07 DIAGNOSIS — E1122 Type 2 diabetes mellitus with diabetic chronic kidney disease: Secondary | ICD-10-CM | POA: Diagnosis not present

## 2022-09-07 DIAGNOSIS — R296 Repeated falls: Secondary | ICD-10-CM | POA: Diagnosis not present

## 2022-09-07 DIAGNOSIS — E119 Type 2 diabetes mellitus without complications: Secondary | ICD-10-CM | POA: Diagnosis not present

## 2022-09-07 DIAGNOSIS — Z7984 Long term (current) use of oral hypoglycemic drugs: Secondary | ICD-10-CM | POA: Diagnosis not present

## 2022-09-07 DIAGNOSIS — I50812 Chronic right heart failure: Secondary | ICD-10-CM | POA: Diagnosis not present

## 2022-09-07 DIAGNOSIS — N183 Chronic kidney disease, stage 3 unspecified: Secondary | ICD-10-CM | POA: Diagnosis not present

## 2022-09-07 DIAGNOSIS — I5022 Chronic systolic (congestive) heart failure: Secondary | ICD-10-CM | POA: Diagnosis not present

## 2022-09-09 DIAGNOSIS — N183 Chronic kidney disease, stage 3 unspecified: Secondary | ICD-10-CM | POA: Diagnosis not present

## 2022-09-09 DIAGNOSIS — R296 Repeated falls: Secondary | ICD-10-CM | POA: Diagnosis not present

## 2022-09-09 DIAGNOSIS — I50812 Chronic right heart failure: Secondary | ICD-10-CM | POA: Diagnosis not present

## 2022-09-09 DIAGNOSIS — Z9981 Dependence on supplemental oxygen: Secondary | ICD-10-CM | POA: Diagnosis not present

## 2022-09-09 DIAGNOSIS — Z7984 Long term (current) use of oral hypoglycemic drugs: Secondary | ICD-10-CM | POA: Diagnosis not present

## 2022-09-09 DIAGNOSIS — I5022 Chronic systolic (congestive) heart failure: Secondary | ICD-10-CM | POA: Diagnosis not present

## 2022-09-09 DIAGNOSIS — E1142 Type 2 diabetes mellitus with diabetic polyneuropathy: Secondary | ICD-10-CM | POA: Diagnosis not present

## 2022-09-09 DIAGNOSIS — E1122 Type 2 diabetes mellitus with diabetic chronic kidney disease: Secondary | ICD-10-CM | POA: Diagnosis not present

## 2022-09-09 DIAGNOSIS — I130 Hypertensive heart and chronic kidney disease with heart failure and stage 1 through stage 4 chronic kidney disease, or unspecified chronic kidney disease: Secondary | ICD-10-CM | POA: Diagnosis not present

## 2022-09-09 DIAGNOSIS — I255 Ischemic cardiomyopathy: Secondary | ICD-10-CM | POA: Diagnosis not present

## 2022-09-16 DIAGNOSIS — E119 Type 2 diabetes mellitus without complications: Secondary | ICD-10-CM | POA: Diagnosis not present

## 2022-09-17 DIAGNOSIS — Z7901 Long term (current) use of anticoagulants: Secondary | ICD-10-CM | POA: Diagnosis not present

## 2022-09-24 DIAGNOSIS — I509 Heart failure, unspecified: Secondary | ICD-10-CM | POA: Diagnosis not present

## 2022-09-24 DIAGNOSIS — Z8679 Personal history of other diseases of the circulatory system: Secondary | ICD-10-CM | POA: Diagnosis not present

## 2022-09-24 DIAGNOSIS — Z7901 Long term (current) use of anticoagulants: Secondary | ICD-10-CM | POA: Diagnosis not present

## 2022-09-24 DIAGNOSIS — Z515 Encounter for palliative care: Secondary | ICD-10-CM | POA: Diagnosis not present

## 2022-09-24 DIAGNOSIS — Z951 Presence of aortocoronary bypass graft: Secondary | ICD-10-CM | POA: Diagnosis not present

## 2022-09-24 DIAGNOSIS — Z95811 Presence of heart assist device: Secondary | ICD-10-CM | POA: Diagnosis not present

## 2022-09-24 DIAGNOSIS — Z7982 Long term (current) use of aspirin: Secondary | ICD-10-CM | POA: Diagnosis not present

## 2022-09-24 DIAGNOSIS — I2510 Atherosclerotic heart disease of native coronary artery without angina pectoris: Secondary | ICD-10-CM | POA: Diagnosis not present

## 2022-09-24 DIAGNOSIS — I739 Peripheral vascular disease, unspecified: Secondary | ICD-10-CM | POA: Diagnosis not present

## 2022-09-28 DIAGNOSIS — I951 Orthostatic hypotension: Secondary | ICD-10-CM | POA: Diagnosis not present

## 2022-09-28 DIAGNOSIS — E1159 Type 2 diabetes mellitus with other circulatory complications: Secondary | ICD-10-CM | POA: Diagnosis not present

## 2022-09-28 DIAGNOSIS — Z95810 Presence of automatic (implantable) cardiac defibrillator: Secondary | ICD-10-CM | POA: Diagnosis not present

## 2022-09-28 DIAGNOSIS — I4901 Ventricular fibrillation: Secondary | ICD-10-CM | POA: Diagnosis not present

## 2022-09-28 DIAGNOSIS — I4891 Unspecified atrial fibrillation: Secondary | ICD-10-CM | POA: Diagnosis not present

## 2022-09-28 DIAGNOSIS — A498 Other bacterial infections of unspecified site: Secondary | ICD-10-CM | POA: Diagnosis not present

## 2022-09-28 DIAGNOSIS — I440 Atrioventricular block, first degree: Secondary | ICD-10-CM | POA: Diagnosis not present

## 2022-09-28 DIAGNOSIS — Z7984 Long term (current) use of oral hypoglycemic drugs: Secondary | ICD-10-CM | POA: Diagnosis not present

## 2022-09-28 DIAGNOSIS — Z95811 Presence of heart assist device: Secondary | ICD-10-CM | POA: Diagnosis not present

## 2022-09-28 DIAGNOSIS — D649 Anemia, unspecified: Secondary | ICD-10-CM | POA: Diagnosis not present

## 2022-09-28 DIAGNOSIS — Z7185 Encounter for immunization safety counseling: Secondary | ICD-10-CM | POA: Diagnosis not present

## 2022-09-28 DIAGNOSIS — Z79899 Other long term (current) drug therapy: Secondary | ICD-10-CM | POA: Diagnosis not present

## 2022-09-28 DIAGNOSIS — I447 Left bundle-branch block, unspecified: Secondary | ICD-10-CM | POA: Diagnosis not present

## 2022-09-28 DIAGNOSIS — I082 Rheumatic disorders of both aortic and tricuspid valves: Secondary | ICD-10-CM | POA: Diagnosis not present

## 2022-09-28 DIAGNOSIS — E785 Hyperlipidemia, unspecified: Secondary | ICD-10-CM | POA: Diagnosis not present

## 2022-09-28 DIAGNOSIS — Z8719 Personal history of other diseases of the digestive system: Secondary | ICD-10-CM | POA: Diagnosis not present

## 2022-09-28 DIAGNOSIS — Z4509 Encounter for adjustment and management of other cardiac device: Secondary | ICD-10-CM | POA: Diagnosis not present

## 2022-09-28 DIAGNOSIS — I5022 Chronic systolic (congestive) heart failure: Secondary | ICD-10-CM | POA: Diagnosis not present

## 2022-09-28 DIAGNOSIS — E1122 Type 2 diabetes mellitus with diabetic chronic kidney disease: Secondary | ICD-10-CM | POA: Diagnosis not present

## 2022-09-28 DIAGNOSIS — I4729 Other ventricular tachycardia: Secondary | ICD-10-CM | POA: Diagnosis not present

## 2022-09-28 DIAGNOSIS — R42 Dizziness and giddiness: Secondary | ICD-10-CM | POA: Diagnosis not present

## 2022-09-28 DIAGNOSIS — R9431 Abnormal electrocardiogram [ECG] [EKG]: Secondary | ICD-10-CM | POA: Diagnosis not present

## 2022-09-28 DIAGNOSIS — G4733 Obstructive sleep apnea (adult) (pediatric): Secondary | ICD-10-CM | POA: Diagnosis not present

## 2022-09-28 DIAGNOSIS — I509 Heart failure, unspecified: Secondary | ICD-10-CM | POA: Diagnosis not present

## 2022-09-28 DIAGNOSIS — T829XXD Unspecified complication of cardiac and vascular prosthetic device, implant and graft, subsequent encounter: Secondary | ICD-10-CM | POA: Diagnosis not present

## 2022-09-28 DIAGNOSIS — I130 Hypertensive heart and chronic kidney disease with heart failure and stage 1 through stage 4 chronic kidney disease, or unspecified chronic kidney disease: Secondary | ICD-10-CM | POA: Diagnosis not present

## 2022-09-28 DIAGNOSIS — N183 Chronic kidney disease, stage 3 unspecified: Secondary | ICD-10-CM | POA: Diagnosis not present

## 2022-09-28 DIAGNOSIS — C61 Malignant neoplasm of prostate: Secondary | ICD-10-CM | POA: Diagnosis not present

## 2022-09-28 DIAGNOSIS — I255 Ischemic cardiomyopathy: Secondary | ICD-10-CM | POA: Diagnosis not present

## 2022-09-28 DIAGNOSIS — Z7901 Long term (current) use of anticoagulants: Secondary | ICD-10-CM | POA: Diagnosis not present

## 2022-09-28 DIAGNOSIS — Z23 Encounter for immunization: Secondary | ICD-10-CM | POA: Diagnosis not present

## 2022-09-29 DIAGNOSIS — J189 Pneumonia, unspecified organism: Secondary | ICD-10-CM | POA: Diagnosis not present

## 2022-09-29 DIAGNOSIS — I5022 Chronic systolic (congestive) heart failure: Secondary | ICD-10-CM | POA: Diagnosis not present

## 2022-10-01 DIAGNOSIS — Z4502 Encounter for adjustment and management of automatic implantable cardiac defibrillator: Secondary | ICD-10-CM | POA: Diagnosis not present

## 2022-10-02 DIAGNOSIS — Z4502 Encounter for adjustment and management of automatic implantable cardiac defibrillator: Secondary | ICD-10-CM | POA: Diagnosis not present

## 2022-10-08 DIAGNOSIS — I130 Hypertensive heart and chronic kidney disease with heart failure and stage 1 through stage 4 chronic kidney disease, or unspecified chronic kidney disease: Secondary | ICD-10-CM | POA: Diagnosis not present

## 2022-10-08 DIAGNOSIS — N183 Chronic kidney disease, stage 3 unspecified: Secondary | ICD-10-CM | POA: Diagnosis not present

## 2022-10-08 DIAGNOSIS — I255 Ischemic cardiomyopathy: Secondary | ICD-10-CM | POA: Diagnosis not present

## 2022-10-08 DIAGNOSIS — E1142 Type 2 diabetes mellitus with diabetic polyneuropathy: Secondary | ICD-10-CM | POA: Diagnosis not present

## 2022-10-08 DIAGNOSIS — E1122 Type 2 diabetes mellitus with diabetic chronic kidney disease: Secondary | ICD-10-CM | POA: Diagnosis not present

## 2022-10-08 DIAGNOSIS — Z7984 Long term (current) use of oral hypoglycemic drugs: Secondary | ICD-10-CM | POA: Diagnosis not present

## 2022-10-08 DIAGNOSIS — Z9981 Dependence on supplemental oxygen: Secondary | ICD-10-CM | POA: Diagnosis not present

## 2022-10-08 DIAGNOSIS — I5022 Chronic systolic (congestive) heart failure: Secondary | ICD-10-CM | POA: Diagnosis not present

## 2022-10-08 DIAGNOSIS — I50812 Chronic right heart failure: Secondary | ICD-10-CM | POA: Diagnosis not present

## 2022-10-08 DIAGNOSIS — R296 Repeated falls: Secondary | ICD-10-CM | POA: Diagnosis not present

## 2022-10-14 DIAGNOSIS — I447 Left bundle-branch block, unspecified: Secondary | ICD-10-CM | POA: Diagnosis not present

## 2022-10-14 DIAGNOSIS — E785 Hyperlipidemia, unspecified: Secondary | ICD-10-CM | POA: Diagnosis not present

## 2022-10-14 DIAGNOSIS — I5022 Chronic systolic (congestive) heart failure: Secondary | ICD-10-CM | POA: Diagnosis not present

## 2022-10-14 DIAGNOSIS — Z79899 Other long term (current) drug therapy: Secondary | ICD-10-CM | POA: Diagnosis not present

## 2022-10-14 DIAGNOSIS — Z95811 Presence of heart assist device: Secondary | ICD-10-CM | POA: Diagnosis not present

## 2022-10-14 DIAGNOSIS — D696 Thrombocytopenia, unspecified: Secondary | ICD-10-CM | POA: Diagnosis not present

## 2022-10-14 DIAGNOSIS — Z87891 Personal history of nicotine dependence: Secondary | ICD-10-CM | POA: Diagnosis not present

## 2022-10-14 DIAGNOSIS — E119 Type 2 diabetes mellitus without complications: Secondary | ICD-10-CM | POA: Diagnosis not present

## 2022-10-14 DIAGNOSIS — Z9989 Dependence on other enabling machines and devices: Secondary | ICD-10-CM | POA: Diagnosis not present

## 2022-10-14 DIAGNOSIS — Z7902 Long term (current) use of antithrombotics/antiplatelets: Secondary | ICD-10-CM | POA: Diagnosis not present

## 2022-10-14 DIAGNOSIS — I119 Hypertensive heart disease without heart failure: Secondary | ICD-10-CM | POA: Diagnosis not present

## 2022-10-14 DIAGNOSIS — R9431 Abnormal electrocardiogram [ECG] [EKG]: Secondary | ICD-10-CM | POA: Diagnosis not present

## 2022-10-14 DIAGNOSIS — R42 Dizziness and giddiness: Secondary | ICD-10-CM | POA: Diagnosis not present

## 2022-10-14 DIAGNOSIS — R531 Weakness: Secondary | ICD-10-CM | POA: Diagnosis not present

## 2022-10-14 DIAGNOSIS — R0789 Other chest pain: Secondary | ICD-10-CM | POA: Diagnosis not present

## 2022-10-14 DIAGNOSIS — D509 Iron deficiency anemia, unspecified: Secondary | ICD-10-CM | POA: Diagnosis not present

## 2022-10-14 DIAGNOSIS — R110 Nausea: Secondary | ICD-10-CM | POA: Diagnosis not present

## 2022-10-14 DIAGNOSIS — I440 Atrioventricular block, first degree: Secondary | ICD-10-CM | POA: Diagnosis not present

## 2022-10-14 DIAGNOSIS — R55 Syncope and collapse: Secondary | ICD-10-CM | POA: Diagnosis not present

## 2022-10-14 DIAGNOSIS — Z5181 Encounter for therapeutic drug level monitoring: Secondary | ICD-10-CM | POA: Diagnosis not present

## 2022-10-14 DIAGNOSIS — R079 Chest pain, unspecified: Secondary | ICD-10-CM | POA: Diagnosis not present

## 2022-10-14 DIAGNOSIS — I259 Chronic ischemic heart disease, unspecified: Secondary | ICD-10-CM | POA: Diagnosis not present

## 2022-10-14 DIAGNOSIS — D649 Anemia, unspecified: Secondary | ICD-10-CM | POA: Diagnosis not present

## 2022-10-14 DIAGNOSIS — G4733 Obstructive sleep apnea (adult) (pediatric): Secondary | ICD-10-CM | POA: Diagnosis not present

## 2022-10-18 DIAGNOSIS — Z8719 Personal history of other diseases of the digestive system: Secondary | ICD-10-CM | POA: Diagnosis not present

## 2022-10-18 DIAGNOSIS — I5022 Chronic systolic (congestive) heart failure: Secondary | ICD-10-CM | POA: Diagnosis not present

## 2022-10-18 DIAGNOSIS — D500 Iron deficiency anemia secondary to blood loss (chronic): Secondary | ICD-10-CM | POA: Diagnosis not present

## 2022-10-21 DIAGNOSIS — I5022 Chronic systolic (congestive) heart failure: Secondary | ICD-10-CM | POA: Diagnosis not present

## 2022-10-21 DIAGNOSIS — Z8719 Personal history of other diseases of the digestive system: Secondary | ICD-10-CM | POA: Diagnosis not present

## 2022-10-21 DIAGNOSIS — D500 Iron deficiency anemia secondary to blood loss (chronic): Secondary | ICD-10-CM | POA: Diagnosis not present

## 2022-10-22 DIAGNOSIS — Z7901 Long term (current) use of anticoagulants: Secondary | ICD-10-CM | POA: Diagnosis not present

## 2022-10-27 ENCOUNTER — Encounter: Payer: Self-pay | Admitting: Podiatry

## 2022-10-30 DIAGNOSIS — I5022 Chronic systolic (congestive) heart failure: Secondary | ICD-10-CM | POA: Diagnosis not present

## 2022-10-30 DIAGNOSIS — J189 Pneumonia, unspecified organism: Secondary | ICD-10-CM | POA: Diagnosis not present

## 2022-11-04 DIAGNOSIS — D500 Iron deficiency anemia secondary to blood loss (chronic): Secondary | ICD-10-CM | POA: Diagnosis not present

## 2022-11-04 DIAGNOSIS — Z8719 Personal history of other diseases of the digestive system: Secondary | ICD-10-CM | POA: Diagnosis not present

## 2022-11-04 DIAGNOSIS — I5022 Chronic systolic (congestive) heart failure: Secondary | ICD-10-CM | POA: Diagnosis not present

## 2022-11-18 DIAGNOSIS — I5022 Chronic systolic (congestive) heart failure: Secondary | ICD-10-CM | POA: Diagnosis not present

## 2022-11-18 DIAGNOSIS — D500 Iron deficiency anemia secondary to blood loss (chronic): Secondary | ICD-10-CM | POA: Diagnosis not present

## 2022-11-18 DIAGNOSIS — Z8719 Personal history of other diseases of the digestive system: Secondary | ICD-10-CM | POA: Diagnosis not present

## 2022-11-19 DIAGNOSIS — Z7901 Long term (current) use of anticoagulants: Secondary | ICD-10-CM | POA: Diagnosis not present

## 2022-11-29 DIAGNOSIS — J189 Pneumonia, unspecified organism: Secondary | ICD-10-CM | POA: Diagnosis not present

## 2022-11-29 DIAGNOSIS — I5022 Chronic systolic (congestive) heart failure: Secondary | ICD-10-CM | POA: Diagnosis not present

## 2022-12-02 DIAGNOSIS — E1159 Type 2 diabetes mellitus with other circulatory complications: Secondary | ICD-10-CM | POA: Diagnosis not present

## 2022-12-02 DIAGNOSIS — Z95811 Presence of heart assist device: Secondary | ICD-10-CM | POA: Diagnosis not present

## 2022-12-02 DIAGNOSIS — I2510 Atherosclerotic heart disease of native coronary artery without angina pectoris: Secondary | ICD-10-CM | POA: Diagnosis not present

## 2022-12-02 DIAGNOSIS — I4901 Ventricular fibrillation: Secondary | ICD-10-CM | POA: Diagnosis not present

## 2022-12-02 DIAGNOSIS — I5022 Chronic systolic (congestive) heart failure: Secondary | ICD-10-CM | POA: Diagnosis not present

## 2022-12-10 DIAGNOSIS — E119 Type 2 diabetes mellitus without complications: Secondary | ICD-10-CM | POA: Diagnosis not present

## 2022-12-15 DIAGNOSIS — D500 Iron deficiency anemia secondary to blood loss (chronic): Secondary | ICD-10-CM | POA: Diagnosis not present

## 2022-12-15 DIAGNOSIS — Z8719 Personal history of other diseases of the digestive system: Secondary | ICD-10-CM | POA: Diagnosis not present

## 2022-12-15 DIAGNOSIS — I5022 Chronic systolic (congestive) heart failure: Secondary | ICD-10-CM | POA: Diagnosis not present

## 2022-12-17 DIAGNOSIS — Z7901 Long term (current) use of anticoagulants: Secondary | ICD-10-CM | POA: Diagnosis not present

## 2022-12-23 DIAGNOSIS — R413 Other amnesia: Secondary | ICD-10-CM | POA: Diagnosis not present

## 2022-12-23 DIAGNOSIS — R2681 Unsteadiness on feet: Secondary | ICD-10-CM | POA: Diagnosis not present

## 2022-12-23 DIAGNOSIS — E1159 Type 2 diabetes mellitus with other circulatory complications: Secondary | ICD-10-CM | POA: Diagnosis not present

## 2022-12-23 DIAGNOSIS — I4901 Ventricular fibrillation: Secondary | ICD-10-CM | POA: Diagnosis not present

## 2022-12-23 DIAGNOSIS — Z95811 Presence of heart assist device: Secondary | ICD-10-CM | POA: Diagnosis not present

## 2022-12-23 DIAGNOSIS — D508 Other iron deficiency anemias: Secondary | ICD-10-CM | POA: Diagnosis not present

## 2022-12-23 DIAGNOSIS — N1832 Chronic kidney disease, stage 3b: Secondary | ICD-10-CM | POA: Diagnosis not present

## 2022-12-23 DIAGNOSIS — I5022 Chronic systolic (congestive) heart failure: Secondary | ICD-10-CM | POA: Diagnosis not present

## 2022-12-23 DIAGNOSIS — I70219 Atherosclerosis of native arteries of extremities with intermittent claudication, unspecified extremity: Secondary | ICD-10-CM | POA: Diagnosis not present

## 2022-12-23 DIAGNOSIS — D696 Thrombocytopenia, unspecified: Secondary | ICD-10-CM | POA: Diagnosis not present

## 2022-12-28 DIAGNOSIS — E1159 Type 2 diabetes mellitus with other circulatory complications: Secondary | ICD-10-CM | POA: Diagnosis not present

## 2022-12-28 DIAGNOSIS — N1832 Chronic kidney disease, stage 3b: Secondary | ICD-10-CM | POA: Diagnosis not present

## 2022-12-28 DIAGNOSIS — E1122 Type 2 diabetes mellitus with diabetic chronic kidney disease: Secondary | ICD-10-CM | POA: Diagnosis not present

## 2022-12-28 DIAGNOSIS — I70219 Atherosclerosis of native arteries of extremities with intermittent claudication, unspecified extremity: Secondary | ICD-10-CM | POA: Diagnosis not present

## 2022-12-28 DIAGNOSIS — D508 Other iron deficiency anemias: Secondary | ICD-10-CM | POA: Diagnosis not present

## 2022-12-28 DIAGNOSIS — Z7984 Long term (current) use of oral hypoglycemic drugs: Secondary | ICD-10-CM | POA: Diagnosis not present

## 2022-12-28 DIAGNOSIS — I739 Peripheral vascular disease, unspecified: Secondary | ICD-10-CM | POA: Diagnosis not present

## 2022-12-28 DIAGNOSIS — R2681 Unsteadiness on feet: Secondary | ICD-10-CM | POA: Diagnosis not present

## 2022-12-28 DIAGNOSIS — I5022 Chronic systolic (congestive) heart failure: Secondary | ICD-10-CM | POA: Diagnosis not present

## 2022-12-28 DIAGNOSIS — C61 Malignant neoplasm of prostate: Secondary | ICD-10-CM | POA: Diagnosis not present

## 2022-12-30 DIAGNOSIS — J189 Pneumonia, unspecified organism: Secondary | ICD-10-CM | POA: Diagnosis not present

## 2022-12-30 DIAGNOSIS — Z95811 Presence of heart assist device: Secondary | ICD-10-CM | POA: Diagnosis not present

## 2022-12-30 DIAGNOSIS — I5022 Chronic systolic (congestive) heart failure: Secondary | ICD-10-CM | POA: Diagnosis not present

## 2022-12-31 DIAGNOSIS — Z4502 Encounter for adjustment and management of automatic implantable cardiac defibrillator: Secondary | ICD-10-CM | POA: Diagnosis not present

## 2023-01-01 DIAGNOSIS — Z4502 Encounter for adjustment and management of automatic implantable cardiac defibrillator: Secondary | ICD-10-CM | POA: Diagnosis not present

## 2023-01-03 DIAGNOSIS — Z7984 Long term (current) use of oral hypoglycemic drugs: Secondary | ICD-10-CM | POA: Diagnosis not present

## 2023-01-03 DIAGNOSIS — I70219 Atherosclerosis of native arteries of extremities with intermittent claudication, unspecified extremity: Secondary | ICD-10-CM | POA: Diagnosis not present

## 2023-01-03 DIAGNOSIS — D508 Other iron deficiency anemias: Secondary | ICD-10-CM | POA: Diagnosis not present

## 2023-01-03 DIAGNOSIS — N1832 Chronic kidney disease, stage 3b: Secondary | ICD-10-CM | POA: Diagnosis not present

## 2023-01-03 DIAGNOSIS — E1122 Type 2 diabetes mellitus with diabetic chronic kidney disease: Secondary | ICD-10-CM | POA: Diagnosis not present

## 2023-01-03 DIAGNOSIS — Z95811 Presence of heart assist device: Secondary | ICD-10-CM | POA: Diagnosis not present

## 2023-01-03 DIAGNOSIS — E1159 Type 2 diabetes mellitus with other circulatory complications: Secondary | ICD-10-CM | POA: Diagnosis not present

## 2023-01-03 DIAGNOSIS — I2510 Atherosclerotic heart disease of native coronary artery without angina pectoris: Secondary | ICD-10-CM | POA: Diagnosis not present

## 2023-01-03 DIAGNOSIS — I4901 Ventricular fibrillation: Secondary | ICD-10-CM | POA: Diagnosis not present

## 2023-01-03 DIAGNOSIS — I5022 Chronic systolic (congestive) heart failure: Secondary | ICD-10-CM | POA: Diagnosis not present

## 2023-01-06 DIAGNOSIS — E1159 Type 2 diabetes mellitus with other circulatory complications: Secondary | ICD-10-CM | POA: Diagnosis not present

## 2023-01-06 DIAGNOSIS — I739 Peripheral vascular disease, unspecified: Secondary | ICD-10-CM | POA: Diagnosis not present

## 2023-01-06 DIAGNOSIS — I70219 Atherosclerosis of native arteries of extremities with intermittent claudication, unspecified extremity: Secondary | ICD-10-CM | POA: Diagnosis not present

## 2023-01-06 DIAGNOSIS — Z7984 Long term (current) use of oral hypoglycemic drugs: Secondary | ICD-10-CM | POA: Diagnosis not present

## 2023-01-06 DIAGNOSIS — C61 Malignant neoplasm of prostate: Secondary | ICD-10-CM | POA: Diagnosis not present

## 2023-01-06 DIAGNOSIS — R2681 Unsteadiness on feet: Secondary | ICD-10-CM | POA: Diagnosis not present

## 2023-01-06 DIAGNOSIS — E1122 Type 2 diabetes mellitus with diabetic chronic kidney disease: Secondary | ICD-10-CM | POA: Diagnosis not present

## 2023-01-06 DIAGNOSIS — I5022 Chronic systolic (congestive) heart failure: Secondary | ICD-10-CM | POA: Diagnosis not present

## 2023-01-06 DIAGNOSIS — N1832 Chronic kidney disease, stage 3b: Secondary | ICD-10-CM | POA: Diagnosis not present

## 2023-01-06 DIAGNOSIS — D508 Other iron deficiency anemias: Secondary | ICD-10-CM | POA: Diagnosis not present

## 2023-01-13 DIAGNOSIS — I5022 Chronic systolic (congestive) heart failure: Secondary | ICD-10-CM | POA: Diagnosis not present

## 2023-01-15 DIAGNOSIS — Z7901 Long term (current) use of anticoagulants: Secondary | ICD-10-CM | POA: Diagnosis not present

## 2023-01-27 DIAGNOSIS — I5022 Chronic systolic (congestive) heart failure: Secondary | ICD-10-CM | POA: Diagnosis not present

## 2023-01-28 ENCOUNTER — Ambulatory Visit: Payer: No Typology Code available for payment source | Admitting: Podiatry

## 2023-01-28 DIAGNOSIS — G453 Amaurosis fugax: Secondary | ICD-10-CM | POA: Diagnosis not present

## 2023-01-28 DIAGNOSIS — I25810 Atherosclerosis of coronary artery bypass graft(s) without angina pectoris: Secondary | ICD-10-CM | POA: Diagnosis not present

## 2023-01-28 DIAGNOSIS — Z888 Allergy status to other drugs, medicaments and biological substances status: Secondary | ICD-10-CM | POA: Diagnosis not present

## 2023-01-28 DIAGNOSIS — E1151 Type 2 diabetes mellitus with diabetic peripheral angiopathy without gangrene: Secondary | ICD-10-CM | POA: Diagnosis not present

## 2023-01-28 DIAGNOSIS — I6529 Occlusion and stenosis of unspecified carotid artery: Secondary | ICD-10-CM | POA: Diagnosis not present

## 2023-01-28 DIAGNOSIS — I6522 Occlusion and stenosis of left carotid artery: Secondary | ICD-10-CM | POA: Diagnosis not present

## 2023-01-28 DIAGNOSIS — K219 Gastro-esophageal reflux disease without esophagitis: Secondary | ICD-10-CM | POA: Diagnosis not present

## 2023-01-28 DIAGNOSIS — T502X5A Adverse effect of carbonic-anhydrase inhibitors, benzothiadiazides and other diuretics, initial encounter: Secondary | ICD-10-CM | POA: Diagnosis not present

## 2023-01-28 DIAGNOSIS — N183 Chronic kidney disease, stage 3 unspecified: Secondary | ICD-10-CM | POA: Diagnosis not present

## 2023-01-28 DIAGNOSIS — E1122 Type 2 diabetes mellitus with diabetic chronic kidney disease: Secondary | ICD-10-CM | POA: Diagnosis not present

## 2023-01-28 DIAGNOSIS — J189 Pneumonia, unspecified organism: Secondary | ICD-10-CM | POA: Diagnosis not present

## 2023-01-28 DIAGNOSIS — J449 Chronic obstructive pulmonary disease, unspecified: Secondary | ICD-10-CM | POA: Diagnosis not present

## 2023-01-28 DIAGNOSIS — Z8616 Personal history of COVID-19: Secondary | ICD-10-CM | POA: Diagnosis not present

## 2023-01-28 DIAGNOSIS — Z95811 Presence of heart assist device: Secondary | ICD-10-CM | POA: Diagnosis not present

## 2023-01-28 DIAGNOSIS — I5023 Acute on chronic systolic (congestive) heart failure: Secondary | ICD-10-CM | POA: Diagnosis not present

## 2023-01-28 DIAGNOSIS — G4733 Obstructive sleep apnea (adult) (pediatric): Secondary | ICD-10-CM | POA: Diagnosis not present

## 2023-01-28 DIAGNOSIS — Z87891 Personal history of nicotine dependence: Secondary | ICD-10-CM | POA: Diagnosis not present

## 2023-01-28 DIAGNOSIS — R140 Abdominal distension (gaseous): Secondary | ICD-10-CM | POA: Diagnosis not present

## 2023-01-28 DIAGNOSIS — D509 Iron deficiency anemia, unspecified: Secondary | ICD-10-CM | POA: Diagnosis not present

## 2023-01-28 DIAGNOSIS — I130 Hypertensive heart and chronic kidney disease with heart failure and stage 1 through stage 4 chronic kidney disease, or unspecified chronic kidney disease: Secondary | ICD-10-CM | POA: Diagnosis not present

## 2023-01-28 DIAGNOSIS — I255 Ischemic cardiomyopathy: Secondary | ICD-10-CM | POA: Diagnosis not present

## 2023-01-28 DIAGNOSIS — Z881 Allergy status to other antibiotic agents status: Secondary | ICD-10-CM | POA: Diagnosis not present

## 2023-01-28 DIAGNOSIS — K567 Ileus, unspecified: Secondary | ICD-10-CM | POA: Diagnosis not present

## 2023-01-28 DIAGNOSIS — R188 Other ascites: Secondary | ICD-10-CM | POA: Diagnosis not present

## 2023-01-28 DIAGNOSIS — I5022 Chronic systolic (congestive) heart failure: Secondary | ICD-10-CM | POA: Diagnosis not present

## 2023-01-28 DIAGNOSIS — K5900 Constipation, unspecified: Secondary | ICD-10-CM | POA: Diagnosis not present

## 2023-01-28 DIAGNOSIS — E1142 Type 2 diabetes mellitus with diabetic polyneuropathy: Secondary | ICD-10-CM | POA: Diagnosis not present

## 2023-01-28 DIAGNOSIS — I2510 Atherosclerotic heart disease of native coronary artery without angina pectoris: Secondary | ICD-10-CM | POA: Diagnosis not present

## 2023-01-28 DIAGNOSIS — J811 Chronic pulmonary edema: Secondary | ICD-10-CM | POA: Diagnosis not present

## 2023-01-28 DIAGNOSIS — E876 Hypokalemia: Secondary | ICD-10-CM | POA: Diagnosis not present

## 2023-01-28 DIAGNOSIS — I6523 Occlusion and stenosis of bilateral carotid arteries: Secondary | ICD-10-CM | POA: Diagnosis not present

## 2023-01-28 DIAGNOSIS — E785 Hyperlipidemia, unspecified: Secondary | ICD-10-CM | POA: Diagnosis not present

## 2023-01-28 DIAGNOSIS — I480 Paroxysmal atrial fibrillation: Secondary | ICD-10-CM | POA: Diagnosis not present

## 2023-02-04 ENCOUNTER — Telehealth: Payer: Self-pay | Admitting: *Deleted

## 2023-02-04 NOTE — Patient Outreach (Signed)
  Care Coordination   Initial Visit Note   02/04/2023 Name: [name] MRN: [id number] DOB: June 29, 1946  [name] is a 77 y.o. year old male who sees Daniel Body, MD for primary care. I spoke with  [name] by phone today.  What matters to the patients health and wellness today? Patient states he is currently in the hospital at [facility] for carotid surgery.  Request call back next week to son [name] as he should be discharged by then.    SDOH assessments and interventions completed:  No     Care Coordination Interventions:  No, not indicated   Follow up plan: Follow up call scheduled for 3/7    Encounter Outcome:  Pt. Visit Completed   Daniel David, RN, MSN, [place] Care Management Care Management Coordinator [phone]

## 2023-02-08 DIAGNOSIS — E1122 Type 2 diabetes mellitus with diabetic chronic kidney disease: Secondary | ICD-10-CM | POA: Diagnosis not present

## 2023-02-08 DIAGNOSIS — D508 Other iron deficiency anemias: Secondary | ICD-10-CM | POA: Diagnosis not present

## 2023-02-08 DIAGNOSIS — I739 Peripheral vascular disease, unspecified: Secondary | ICD-10-CM | POA: Diagnosis not present

## 2023-02-08 DIAGNOSIS — I5022 Chronic systolic (congestive) heart failure: Secondary | ICD-10-CM | POA: Diagnosis not present

## 2023-02-08 DIAGNOSIS — Z7984 Long term (current) use of oral hypoglycemic drugs: Secondary | ICD-10-CM | POA: Diagnosis not present

## 2023-02-08 DIAGNOSIS — E1159 Type 2 diabetes mellitus with other circulatory complications: Secondary | ICD-10-CM | POA: Diagnosis not present

## 2023-02-08 DIAGNOSIS — R2681 Unsteadiness on feet: Secondary | ICD-10-CM | POA: Diagnosis not present

## 2023-02-08 DIAGNOSIS — N1832 Chronic kidney disease, stage 3b: Secondary | ICD-10-CM | POA: Diagnosis not present

## 2023-02-08 DIAGNOSIS — C61 Malignant neoplasm of prostate: Secondary | ICD-10-CM | POA: Diagnosis not present

## 2023-02-08 DIAGNOSIS — I70219 Atherosclerosis of native arteries of extremities with intermittent claudication, unspecified extremity: Secondary | ICD-10-CM | POA: Diagnosis not present

## 2023-02-10 ENCOUNTER — Encounter: Payer: Self-pay | Admitting: *Deleted

## 2023-02-10 ENCOUNTER — Ambulatory Visit: Payer: Self-pay | Admitting: *Deleted

## 2023-02-10 NOTE — Patient Outreach (Signed)
  Care Coordination   Initial Visit Note   02/10/2023 Name: [name] MRN: [id number] DOB: Sep 14, 1946  [name] is a 77 y.o. year old male who sees Daniel Body, MD for primary care. I spoke with [name], son of [name] by phone today.  What matters to the patients health and wellness today?  Patient was admitted to [facility] 2/23 for carotid stenosis, had CEA on 2/28 and was discharged home on 3/3.  Son state patient is doing well, had virtual visit with [facility] on 3/5 as well as initial home health visit (will have PT and OT).  He has been doing well so far off Warafin and on Plavix, will continue to follow up with providers as instructed.  Active with CCM team at [facility], last outreach was yesterday.      SDOH assessments and interventions completed:  Yes  SDOH Interventions Today    Flowsheet Row Most Recent Value  SDOH Interventions   Food Insecurity Interventions Intervention Not Indicated  Housing Interventions Intervention Not Indicated  Transportation Interventions Intervention Not Indicated        Care Coordination Interventions:  Yes, provided   Interventions Today    Flowsheet Row Most Recent Value  Chronic Disease   Chronic disease during today's visit Congestive Heart Failure (CHF)  General Interventions   General Interventions Discussed/Reviewed General Interventions Reviewed, Doctor Visits  Doctor Visits Discussed/Reviewed Doctor Visits Reviewed, Specialist, PCP  PCP/Specialist Visits Compliance with follow-up visit  Pharmacy Interventions   Pharmacy Dicussed/Reviewed Pharmacy Topics Discussed, Affording Medications       Follow up plan: No further intervention required.   Encounter Outcome:  Pt. Visit Completed   Daniel David, RN, MSN, [place] Care Management Care Management Coordinator [phone]

## 2023-02-11 DIAGNOSIS — Z7901 Long term (current) use of anticoagulants: Secondary | ICD-10-CM | POA: Diagnosis not present

## 2023-02-14 DIAGNOSIS — K7689 Other specified diseases of liver: Secondary | ICD-10-CM | POA: Diagnosis not present

## 2023-02-14 DIAGNOSIS — C678 Malignant neoplasm of overlapping sites of bladder: Secondary | ICD-10-CM | POA: Diagnosis not present

## 2023-02-14 DIAGNOSIS — R310 Gross hematuria: Secondary | ICD-10-CM | POA: Diagnosis not present

## 2023-02-14 DIAGNOSIS — N3289 Other specified disorders of bladder: Secondary | ICD-10-CM | POA: Diagnosis not present

## 2023-02-14 DIAGNOSIS — R319 Hematuria, unspecified: Secondary | ICD-10-CM | POA: Diagnosis not present

## 2023-02-14 DIAGNOSIS — Z95811 Presence of heart assist device: Secondary | ICD-10-CM | POA: Diagnosis not present

## 2023-02-14 DIAGNOSIS — I6529 Occlusion and stenosis of unspecified carotid artery: Secondary | ICD-10-CM | POA: Diagnosis not present

## 2023-02-14 DIAGNOSIS — R188 Other ascites: Secondary | ICD-10-CM | POA: Diagnosis not present

## 2023-02-15 DIAGNOSIS — R399 Unspecified symptoms and signs involving the genitourinary system: Secondary | ICD-10-CM | POA: Diagnosis not present

## 2023-02-15 DIAGNOSIS — N3289 Other specified disorders of bladder: Secondary | ICD-10-CM | POA: Diagnosis not present

## 2023-02-15 DIAGNOSIS — I5022 Chronic systolic (congestive) heart failure: Secondary | ICD-10-CM | POA: Diagnosis not present

## 2023-02-15 DIAGNOSIS — R310 Gross hematuria: Secondary | ICD-10-CM | POA: Diagnosis not present

## 2023-02-15 DIAGNOSIS — I50812 Chronic right heart failure: Secondary | ICD-10-CM | POA: Diagnosis not present

## 2023-02-15 DIAGNOSIS — N1831 Chronic kidney disease, stage 3a: Secondary | ICD-10-CM | POA: Diagnosis not present

## 2023-02-15 DIAGNOSIS — R918 Other nonspecific abnormal finding of lung field: Secondary | ICD-10-CM | POA: Diagnosis not present

## 2023-02-15 DIAGNOSIS — F329 Major depressive disorder, single episode, unspecified: Secondary | ICD-10-CM | POA: Diagnosis not present

## 2023-02-15 DIAGNOSIS — Z7901 Long term (current) use of anticoagulants: Secondary | ICD-10-CM | POA: Diagnosis not present

## 2023-02-15 DIAGNOSIS — Z9889 Other specified postprocedural states: Secondary | ICD-10-CM | POA: Diagnosis not present

## 2023-02-15 DIAGNOSIS — E1122 Type 2 diabetes mellitus with diabetic chronic kidney disease: Secondary | ICD-10-CM | POA: Diagnosis not present

## 2023-02-15 DIAGNOSIS — E440 Moderate protein-calorie malnutrition: Secondary | ICD-10-CM | POA: Diagnosis not present

## 2023-02-15 DIAGNOSIS — L03818 Cellulitis of other sites: Secondary | ICD-10-CM | POA: Diagnosis not present

## 2023-02-15 DIAGNOSIS — Z8616 Personal history of COVID-19: Secondary | ICD-10-CM | POA: Diagnosis not present

## 2023-02-15 DIAGNOSIS — N4822 Cellulitis of corpus cavernosum and penis: Secondary | ICD-10-CM | POA: Diagnosis not present

## 2023-02-15 DIAGNOSIS — J189 Pneumonia, unspecified organism: Secondary | ICD-10-CM | POA: Diagnosis not present

## 2023-02-15 DIAGNOSIS — E1151 Type 2 diabetes mellitus with diabetic peripheral angiopathy without gangrene: Secondary | ICD-10-CM | POA: Diagnosis not present

## 2023-02-15 DIAGNOSIS — I255 Ischemic cardiomyopathy: Secondary | ICD-10-CM | POA: Diagnosis not present

## 2023-02-15 DIAGNOSIS — E1142 Type 2 diabetes mellitus with diabetic polyneuropathy: Secondary | ICD-10-CM | POA: Diagnosis not present

## 2023-02-15 DIAGNOSIS — L03315 Cellulitis of perineum: Secondary | ICD-10-CM | POA: Diagnosis not present

## 2023-02-15 DIAGNOSIS — I6523 Occlusion and stenosis of bilateral carotid arteries: Secondary | ICD-10-CM | POA: Diagnosis not present

## 2023-02-15 DIAGNOSIS — N492 Inflammatory disorders of scrotum: Secondary | ICD-10-CM | POA: Diagnosis not present

## 2023-02-15 DIAGNOSIS — N3020 Other chronic cystitis without hematuria: Secondary | ICD-10-CM | POA: Diagnosis not present

## 2023-02-15 DIAGNOSIS — I70212 Atherosclerosis of native arteries of extremities with intermittent claudication, left leg: Secondary | ICD-10-CM | POA: Diagnosis not present

## 2023-02-15 DIAGNOSIS — N3041 Irradiation cystitis with hematuria: Secondary | ICD-10-CM | POA: Diagnosis not present

## 2023-02-15 DIAGNOSIS — D509 Iron deficiency anemia, unspecified: Secondary | ICD-10-CM | POA: Diagnosis not present

## 2023-02-15 DIAGNOSIS — I130 Hypertensive heart and chronic kidney disease with heart failure and stage 1 through stage 4 chronic kidney disease, or unspecified chronic kidney disease: Secondary | ICD-10-CM | POA: Diagnosis not present

## 2023-02-15 DIAGNOSIS — I5023 Acute on chronic systolic (congestive) heart failure: Secondary | ICD-10-CM | POA: Diagnosis not present

## 2023-02-15 DIAGNOSIS — R5381 Other malaise: Secondary | ICD-10-CM | POA: Diagnosis not present

## 2023-02-15 DIAGNOSIS — C678 Malignant neoplasm of overlapping sites of bladder: Secondary | ICD-10-CM | POA: Diagnosis not present

## 2023-02-15 DIAGNOSIS — Z95811 Presence of heart assist device: Secondary | ICD-10-CM | POA: Diagnosis not present

## 2023-02-15 DIAGNOSIS — J811 Chronic pulmonary edema: Secondary | ICD-10-CM | POA: Diagnosis not present

## 2023-02-15 DIAGNOSIS — Z6825 Body mass index (BMI) 25.0-25.9, adult: Secondary | ICD-10-CM | POA: Diagnosis not present

## 2023-02-15 DIAGNOSIS — I420 Dilated cardiomyopathy: Secondary | ICD-10-CM | POA: Diagnosis not present

## 2023-02-15 DIAGNOSIS — I25810 Atherosclerosis of coronary artery bypass graft(s) without angina pectoris: Secondary | ICD-10-CM | POA: Diagnosis not present

## 2023-02-15 DIAGNOSIS — I480 Paroxysmal atrial fibrillation: Secondary | ICD-10-CM | POA: Diagnosis not present

## 2023-02-15 DIAGNOSIS — J449 Chronic obstructive pulmonary disease, unspecified: Secondary | ICD-10-CM | POA: Diagnosis not present

## 2023-02-15 DIAGNOSIS — D62 Acute posthemorrhagic anemia: Secondary | ICD-10-CM | POA: Diagnosis not present

## 2023-02-15 DIAGNOSIS — K7689 Other specified diseases of liver: Secondary | ICD-10-CM | POA: Diagnosis not present

## 2023-02-15 DIAGNOSIS — Y842 Radiological procedure and radiotherapy as the cause of abnormal reaction of the patient, or of later complication, without mention of misadventure at the time of the procedure: Secondary | ICD-10-CM | POA: Diagnosis not present

## 2023-02-15 DIAGNOSIS — R3989 Other symptoms and signs involving the genitourinary system: Secondary | ICD-10-CM | POA: Diagnosis not present

## 2023-02-15 DIAGNOSIS — E119 Type 2 diabetes mellitus without complications: Secondary | ICD-10-CM | POA: Diagnosis not present

## 2023-02-15 DIAGNOSIS — I6529 Occlusion and stenosis of unspecified carotid artery: Secondary | ICD-10-CM | POA: Diagnosis not present

## 2023-02-15 DIAGNOSIS — S3722XA Contusion of bladder, initial encounter: Secondary | ICD-10-CM | POA: Diagnosis not present

## 2023-02-15 DIAGNOSIS — R319 Hematuria, unspecified: Secondary | ICD-10-CM | POA: Diagnosis not present

## 2023-02-15 DIAGNOSIS — L598 Other specified disorders of the skin and subcutaneous tissue related to radiation: Secondary | ICD-10-CM | POA: Diagnosis not present

## 2023-02-15 DIAGNOSIS — R000 Tachycardia, unspecified: Secondary | ICD-10-CM | POA: Diagnosis not present

## 2023-02-16 DIAGNOSIS — I6529 Occlusion and stenosis of unspecified carotid artery: Secondary | ICD-10-CM | POA: Diagnosis not present

## 2023-02-16 DIAGNOSIS — N3020 Other chronic cystitis without hematuria: Secondary | ICD-10-CM | POA: Diagnosis not present

## 2023-02-16 DIAGNOSIS — R319 Hematuria, unspecified: Secondary | ICD-10-CM | POA: Diagnosis not present

## 2023-02-16 DIAGNOSIS — R310 Gross hematuria: Secondary | ICD-10-CM | POA: Diagnosis not present

## 2023-02-16 DIAGNOSIS — C678 Malignant neoplasm of overlapping sites of bladder: Secondary | ICD-10-CM | POA: Diagnosis not present

## 2023-02-16 DIAGNOSIS — N3041 Irradiation cystitis with hematuria: Secondary | ICD-10-CM | POA: Diagnosis not present

## 2023-02-16 DIAGNOSIS — Z95811 Presence of heart assist device: Secondary | ICD-10-CM | POA: Diagnosis not present

## 2023-02-17 DIAGNOSIS — R310 Gross hematuria: Secondary | ICD-10-CM | POA: Diagnosis not present

## 2023-02-17 DIAGNOSIS — Z95811 Presence of heart assist device: Secondary | ICD-10-CM | POA: Diagnosis not present

## 2023-02-17 DIAGNOSIS — R319 Hematuria, unspecified: Secondary | ICD-10-CM | POA: Diagnosis not present

## 2023-02-17 DIAGNOSIS — I6529 Occlusion and stenosis of unspecified carotid artery: Secondary | ICD-10-CM | POA: Diagnosis not present

## 2023-02-18 DIAGNOSIS — R319 Hematuria, unspecified: Secondary | ICD-10-CM | POA: Diagnosis not present

## 2023-02-18 DIAGNOSIS — R310 Gross hematuria: Secondary | ICD-10-CM | POA: Diagnosis not present

## 2023-02-18 DIAGNOSIS — I6529 Occlusion and stenosis of unspecified carotid artery: Secondary | ICD-10-CM | POA: Diagnosis not present

## 2023-02-18 DIAGNOSIS — Z95811 Presence of heart assist device: Secondary | ICD-10-CM | POA: Diagnosis not present

## 2023-02-19 DIAGNOSIS — D62 Acute posthemorrhagic anemia: Secondary | ICD-10-CM | POA: Diagnosis not present

## 2023-02-19 DIAGNOSIS — I5022 Chronic systolic (congestive) heart failure: Secondary | ICD-10-CM | POA: Diagnosis not present

## 2023-02-19 DIAGNOSIS — I255 Ischemic cardiomyopathy: Secondary | ICD-10-CM | POA: Diagnosis not present

## 2023-02-19 DIAGNOSIS — N3041 Irradiation cystitis with hematuria: Secondary | ICD-10-CM | POA: Diagnosis not present

## 2023-02-19 DIAGNOSIS — I420 Dilated cardiomyopathy: Secondary | ICD-10-CM | POA: Diagnosis not present

## 2023-02-19 DIAGNOSIS — Z9889 Other specified postprocedural states: Secondary | ICD-10-CM | POA: Diagnosis not present

## 2023-02-19 DIAGNOSIS — Z7901 Long term (current) use of anticoagulants: Secondary | ICD-10-CM | POA: Diagnosis not present

## 2023-02-19 DIAGNOSIS — I6523 Occlusion and stenosis of bilateral carotid arteries: Secondary | ICD-10-CM | POA: Diagnosis not present

## 2023-02-19 DIAGNOSIS — N4822 Cellulitis of corpus cavernosum and penis: Secondary | ICD-10-CM | POA: Diagnosis not present

## 2023-02-19 DIAGNOSIS — S3722XA Contusion of bladder, initial encounter: Secondary | ICD-10-CM | POA: Diagnosis not present

## 2023-02-19 DIAGNOSIS — R310 Gross hematuria: Secondary | ICD-10-CM | POA: Diagnosis not present

## 2023-02-19 DIAGNOSIS — I50812 Chronic right heart failure: Secondary | ICD-10-CM | POA: Diagnosis not present

## 2023-02-19 DIAGNOSIS — Z95811 Presence of heart assist device: Secondary | ICD-10-CM | POA: Diagnosis not present

## 2023-02-20 DIAGNOSIS — Z7901 Long term (current) use of anticoagulants: Secondary | ICD-10-CM | POA: Diagnosis not present

## 2023-02-20 DIAGNOSIS — Z95811 Presence of heart assist device: Secondary | ICD-10-CM | POA: Diagnosis not present

## 2023-02-20 DIAGNOSIS — S3722XA Contusion of bladder, initial encounter: Secondary | ICD-10-CM | POA: Diagnosis not present

## 2023-02-20 DIAGNOSIS — D62 Acute posthemorrhagic anemia: Secondary | ICD-10-CM | POA: Diagnosis not present

## 2023-02-20 DIAGNOSIS — N4822 Cellulitis of corpus cavernosum and penis: Secondary | ICD-10-CM | POA: Diagnosis not present

## 2023-02-20 DIAGNOSIS — I255 Ischemic cardiomyopathy: Secondary | ICD-10-CM | POA: Diagnosis not present

## 2023-02-20 DIAGNOSIS — R310 Gross hematuria: Secondary | ICD-10-CM | POA: Diagnosis not present

## 2023-02-20 DIAGNOSIS — I50812 Chronic right heart failure: Secondary | ICD-10-CM | POA: Diagnosis not present

## 2023-02-20 DIAGNOSIS — I6523 Occlusion and stenosis of bilateral carotid arteries: Secondary | ICD-10-CM | POA: Diagnosis not present

## 2023-02-20 DIAGNOSIS — I5022 Chronic systolic (congestive) heart failure: Secondary | ICD-10-CM | POA: Diagnosis not present

## 2023-02-20 DIAGNOSIS — Z9889 Other specified postprocedural states: Secondary | ICD-10-CM | POA: Diagnosis not present

## 2023-02-20 DIAGNOSIS — I420 Dilated cardiomyopathy: Secondary | ICD-10-CM | POA: Diagnosis not present

## 2023-02-21 DIAGNOSIS — R319 Hematuria, unspecified: Secondary | ICD-10-CM | POA: Diagnosis not present

## 2023-02-21 DIAGNOSIS — Z95811 Presence of heart assist device: Secondary | ICD-10-CM | POA: Diagnosis not present

## 2023-02-21 DIAGNOSIS — Z9889 Other specified postprocedural states: Secondary | ICD-10-CM | POA: Diagnosis not present

## 2023-02-21 DIAGNOSIS — L03818 Cellulitis of other sites: Secondary | ICD-10-CM | POA: Diagnosis not present

## 2023-02-21 DIAGNOSIS — Z7901 Long term (current) use of anticoagulants: Secondary | ICD-10-CM | POA: Diagnosis not present

## 2023-02-21 DIAGNOSIS — I420 Dilated cardiomyopathy: Secondary | ICD-10-CM | POA: Diagnosis not present

## 2023-02-21 DIAGNOSIS — N4822 Cellulitis of corpus cavernosum and penis: Secondary | ICD-10-CM | POA: Diagnosis not present

## 2023-02-21 DIAGNOSIS — R310 Gross hematuria: Secondary | ICD-10-CM | POA: Diagnosis not present

## 2023-02-21 DIAGNOSIS — R399 Unspecified symptoms and signs involving the genitourinary system: Secondary | ICD-10-CM | POA: Diagnosis not present

## 2023-02-21 DIAGNOSIS — I255 Ischemic cardiomyopathy: Secondary | ICD-10-CM | POA: Diagnosis not present

## 2023-02-21 DIAGNOSIS — N3041 Irradiation cystitis with hematuria: Secondary | ICD-10-CM | POA: Diagnosis not present

## 2023-02-21 DIAGNOSIS — I5022 Chronic systolic (congestive) heart failure: Secondary | ICD-10-CM | POA: Diagnosis not present

## 2023-02-21 DIAGNOSIS — R5381 Other malaise: Secondary | ICD-10-CM | POA: Diagnosis not present

## 2023-02-21 DIAGNOSIS — I50812 Chronic right heart failure: Secondary | ICD-10-CM | POA: Diagnosis not present

## 2023-02-21 DIAGNOSIS — D62 Acute posthemorrhagic anemia: Secondary | ICD-10-CM | POA: Diagnosis not present

## 2023-02-21 DIAGNOSIS — S3722XA Contusion of bladder, initial encounter: Secondary | ICD-10-CM | POA: Diagnosis not present

## 2023-02-21 DIAGNOSIS — I6523 Occlusion and stenosis of bilateral carotid arteries: Secondary | ICD-10-CM | POA: Diagnosis not present

## 2023-02-22 DIAGNOSIS — R5381 Other malaise: Secondary | ICD-10-CM | POA: Diagnosis not present

## 2023-02-22 DIAGNOSIS — Z95811 Presence of heart assist device: Secondary | ICD-10-CM | POA: Diagnosis not present

## 2023-02-22 DIAGNOSIS — I255 Ischemic cardiomyopathy: Secondary | ICD-10-CM | POA: Diagnosis not present

## 2023-02-22 DIAGNOSIS — S3722XA Contusion of bladder, initial encounter: Secondary | ICD-10-CM | POA: Diagnosis not present

## 2023-02-22 DIAGNOSIS — Z9889 Other specified postprocedural states: Secondary | ICD-10-CM | POA: Diagnosis not present

## 2023-02-22 DIAGNOSIS — R310 Gross hematuria: Secondary | ICD-10-CM | POA: Diagnosis not present

## 2023-02-22 DIAGNOSIS — R319 Hematuria, unspecified: Secondary | ICD-10-CM | POA: Diagnosis not present

## 2023-02-22 DIAGNOSIS — L03818 Cellulitis of other sites: Secondary | ICD-10-CM | POA: Diagnosis not present

## 2023-02-22 DIAGNOSIS — D62 Acute posthemorrhagic anemia: Secondary | ICD-10-CM | POA: Diagnosis not present

## 2023-02-22 DIAGNOSIS — R399 Unspecified symptoms and signs involving the genitourinary system: Secondary | ICD-10-CM | POA: Diagnosis not present

## 2023-02-22 DIAGNOSIS — I6523 Occlusion and stenosis of bilateral carotid arteries: Secondary | ICD-10-CM | POA: Diagnosis not present

## 2023-02-22 DIAGNOSIS — I420 Dilated cardiomyopathy: Secondary | ICD-10-CM | POA: Diagnosis not present

## 2023-02-22 DIAGNOSIS — Z7901 Long term (current) use of anticoagulants: Secondary | ICD-10-CM | POA: Diagnosis not present

## 2023-02-22 DIAGNOSIS — I5022 Chronic systolic (congestive) heart failure: Secondary | ICD-10-CM | POA: Diagnosis not present

## 2023-02-22 DIAGNOSIS — N3041 Irradiation cystitis with hematuria: Secondary | ICD-10-CM | POA: Diagnosis not present

## 2023-02-22 DIAGNOSIS — N4822 Cellulitis of corpus cavernosum and penis: Secondary | ICD-10-CM | POA: Diagnosis not present

## 2023-02-22 DIAGNOSIS — I50812 Chronic right heart failure: Secondary | ICD-10-CM | POA: Diagnosis not present

## 2023-02-23 DIAGNOSIS — N4822 Cellulitis of corpus cavernosum and penis: Secondary | ICD-10-CM | POA: Diagnosis not present

## 2023-02-23 DIAGNOSIS — D62 Acute posthemorrhagic anemia: Secondary | ICD-10-CM | POA: Diagnosis not present

## 2023-02-23 DIAGNOSIS — Z95811 Presence of heart assist device: Secondary | ICD-10-CM | POA: Diagnosis not present

## 2023-02-23 DIAGNOSIS — I420 Dilated cardiomyopathy: Secondary | ICD-10-CM | POA: Diagnosis not present

## 2023-02-23 DIAGNOSIS — R5381 Other malaise: Secondary | ICD-10-CM | POA: Diagnosis not present

## 2023-02-23 DIAGNOSIS — N3041 Irradiation cystitis with hematuria: Secondary | ICD-10-CM | POA: Diagnosis not present

## 2023-02-23 DIAGNOSIS — R399 Unspecified symptoms and signs involving the genitourinary system: Secondary | ICD-10-CM | POA: Diagnosis not present

## 2023-02-23 DIAGNOSIS — L03818 Cellulitis of other sites: Secondary | ICD-10-CM | POA: Diagnosis not present

## 2023-02-23 DIAGNOSIS — R319 Hematuria, unspecified: Secondary | ICD-10-CM | POA: Diagnosis not present

## 2023-02-23 DIAGNOSIS — S3722XA Contusion of bladder, initial encounter: Secondary | ICD-10-CM | POA: Diagnosis not present

## 2023-02-23 DIAGNOSIS — I50812 Chronic right heart failure: Secondary | ICD-10-CM | POA: Diagnosis not present

## 2023-02-23 DIAGNOSIS — Z9889 Other specified postprocedural states: Secondary | ICD-10-CM | POA: Diagnosis not present

## 2023-02-23 DIAGNOSIS — I5022 Chronic systolic (congestive) heart failure: Secondary | ICD-10-CM | POA: Diagnosis not present

## 2023-02-23 DIAGNOSIS — I255 Ischemic cardiomyopathy: Secondary | ICD-10-CM | POA: Diagnosis not present

## 2023-02-23 DIAGNOSIS — I6523 Occlusion and stenosis of bilateral carotid arteries: Secondary | ICD-10-CM | POA: Diagnosis not present

## 2023-02-24 DIAGNOSIS — Z95811 Presence of heart assist device: Secondary | ICD-10-CM | POA: Diagnosis not present

## 2023-02-24 DIAGNOSIS — I5022 Chronic systolic (congestive) heart failure: Secondary | ICD-10-CM | POA: Diagnosis not present

## 2023-02-24 DIAGNOSIS — N4822 Cellulitis of corpus cavernosum and penis: Secondary | ICD-10-CM | POA: Diagnosis not present

## 2023-02-24 DIAGNOSIS — I50812 Chronic right heart failure: Secondary | ICD-10-CM | POA: Diagnosis not present

## 2023-02-24 DIAGNOSIS — R5381 Other malaise: Secondary | ICD-10-CM | POA: Diagnosis not present

## 2023-02-24 DIAGNOSIS — I255 Ischemic cardiomyopathy: Secondary | ICD-10-CM | POA: Diagnosis not present

## 2023-02-24 DIAGNOSIS — R399 Unspecified symptoms and signs involving the genitourinary system: Secondary | ICD-10-CM | POA: Diagnosis not present

## 2023-02-24 DIAGNOSIS — N3041 Irradiation cystitis with hematuria: Secondary | ICD-10-CM | POA: Diagnosis not present

## 2023-02-24 DIAGNOSIS — D62 Acute posthemorrhagic anemia: Secondary | ICD-10-CM | POA: Diagnosis not present

## 2023-02-24 DIAGNOSIS — I420 Dilated cardiomyopathy: Secondary | ICD-10-CM | POA: Diagnosis not present

## 2023-02-24 DIAGNOSIS — R319 Hematuria, unspecified: Secondary | ICD-10-CM | POA: Diagnosis not present

## 2023-02-24 DIAGNOSIS — Z9889 Other specified postprocedural states: Secondary | ICD-10-CM | POA: Diagnosis not present

## 2023-02-24 DIAGNOSIS — L03818 Cellulitis of other sites: Secondary | ICD-10-CM | POA: Diagnosis not present

## 2023-02-24 DIAGNOSIS — S3722XA Contusion of bladder, initial encounter: Secondary | ICD-10-CM | POA: Diagnosis not present

## 2023-02-24 DIAGNOSIS — I6523 Occlusion and stenosis of bilateral carotid arteries: Secondary | ICD-10-CM | POA: Diagnosis not present

## 2023-02-25 DIAGNOSIS — R399 Unspecified symptoms and signs involving the genitourinary system: Secondary | ICD-10-CM | POA: Diagnosis not present

## 2023-02-25 DIAGNOSIS — I255 Ischemic cardiomyopathy: Secondary | ICD-10-CM | POA: Diagnosis not present

## 2023-02-25 DIAGNOSIS — I6523 Occlusion and stenosis of bilateral carotid arteries: Secondary | ICD-10-CM | POA: Diagnosis not present

## 2023-02-25 DIAGNOSIS — I50812 Chronic right heart failure: Secondary | ICD-10-CM | POA: Diagnosis not present

## 2023-02-25 DIAGNOSIS — S3722XA Contusion of bladder, initial encounter: Secondary | ICD-10-CM | POA: Diagnosis not present

## 2023-02-25 DIAGNOSIS — I5022 Chronic systolic (congestive) heart failure: Secondary | ICD-10-CM | POA: Diagnosis not present

## 2023-02-25 DIAGNOSIS — Z9889 Other specified postprocedural states: Secondary | ICD-10-CM | POA: Diagnosis not present

## 2023-02-25 DIAGNOSIS — Z95811 Presence of heart assist device: Secondary | ICD-10-CM | POA: Diagnosis not present

## 2023-02-25 DIAGNOSIS — L03818 Cellulitis of other sites: Secondary | ICD-10-CM | POA: Diagnosis not present

## 2023-02-25 DIAGNOSIS — N4822 Cellulitis of corpus cavernosum and penis: Secondary | ICD-10-CM | POA: Diagnosis not present

## 2023-02-25 DIAGNOSIS — D62 Acute posthemorrhagic anemia: Secondary | ICD-10-CM | POA: Diagnosis not present

## 2023-02-25 DIAGNOSIS — R319 Hematuria, unspecified: Secondary | ICD-10-CM | POA: Diagnosis not present

## 2023-02-25 DIAGNOSIS — R5381 Other malaise: Secondary | ICD-10-CM | POA: Diagnosis not present

## 2023-02-25 DIAGNOSIS — I420 Dilated cardiomyopathy: Secondary | ICD-10-CM | POA: Diagnosis not present

## 2023-02-25 DIAGNOSIS — N3041 Irradiation cystitis with hematuria: Secondary | ICD-10-CM | POA: Diagnosis not present

## 2023-02-26 DIAGNOSIS — I5022 Chronic systolic (congestive) heart failure: Secondary | ICD-10-CM | POA: Diagnosis not present

## 2023-02-26 DIAGNOSIS — R399 Unspecified symptoms and signs involving the genitourinary system: Secondary | ICD-10-CM | POA: Diagnosis not present

## 2023-02-26 DIAGNOSIS — Z95811 Presence of heart assist device: Secondary | ICD-10-CM | POA: Diagnosis not present

## 2023-02-26 DIAGNOSIS — R310 Gross hematuria: Secondary | ICD-10-CM | POA: Diagnosis not present

## 2023-02-26 DIAGNOSIS — R5381 Other malaise: Secondary | ICD-10-CM | POA: Diagnosis not present

## 2023-02-26 DIAGNOSIS — N3041 Irradiation cystitis with hematuria: Secondary | ICD-10-CM | POA: Diagnosis not present

## 2023-02-27 DIAGNOSIS — Z95811 Presence of heart assist device: Secondary | ICD-10-CM | POA: Diagnosis not present

## 2023-02-27 DIAGNOSIS — R5381 Other malaise: Secondary | ICD-10-CM | POA: Diagnosis not present

## 2023-02-27 DIAGNOSIS — R399 Unspecified symptoms and signs involving the genitourinary system: Secondary | ICD-10-CM | POA: Diagnosis not present

## 2023-02-27 DIAGNOSIS — I5022 Chronic systolic (congestive) heart failure: Secondary | ICD-10-CM | POA: Diagnosis not present

## 2023-02-27 DIAGNOSIS — N3041 Irradiation cystitis with hematuria: Secondary | ICD-10-CM | POA: Diagnosis not present

## 2023-02-27 DIAGNOSIS — R310 Gross hematuria: Secondary | ICD-10-CM | POA: Diagnosis not present

## 2023-02-28 DIAGNOSIS — N3041 Irradiation cystitis with hematuria: Secondary | ICD-10-CM | POA: Diagnosis not present

## 2023-02-28 DIAGNOSIS — J189 Pneumonia, unspecified organism: Secondary | ICD-10-CM | POA: Diagnosis not present

## 2023-02-28 DIAGNOSIS — Z95811 Presence of heart assist device: Secondary | ICD-10-CM | POA: Diagnosis not present

## 2023-02-28 DIAGNOSIS — R310 Gross hematuria: Secondary | ICD-10-CM | POA: Diagnosis not present

## 2023-02-28 DIAGNOSIS — I5022 Chronic systolic (congestive) heart failure: Secondary | ICD-10-CM | POA: Diagnosis not present

## 2023-03-01 DIAGNOSIS — R310 Gross hematuria: Secondary | ICD-10-CM | POA: Diagnosis not present

## 2023-03-01 DIAGNOSIS — Z95811 Presence of heart assist device: Secondary | ICD-10-CM | POA: Diagnosis not present

## 2023-03-01 DIAGNOSIS — N3041 Irradiation cystitis with hematuria: Secondary | ICD-10-CM | POA: Diagnosis not present

## 2023-03-02 DIAGNOSIS — N3041 Irradiation cystitis with hematuria: Secondary | ICD-10-CM | POA: Diagnosis not present

## 2023-03-02 DIAGNOSIS — Z95811 Presence of heart assist device: Secondary | ICD-10-CM | POA: Diagnosis not present

## 2023-03-02 DIAGNOSIS — R310 Gross hematuria: Secondary | ICD-10-CM | POA: Diagnosis not present

## 2023-03-03 DIAGNOSIS — Y842 Radiological procedure and radiotherapy as the cause of abnormal reaction of the patient, or of later complication, without mention of misadventure at the time of the procedure: Secondary | ICD-10-CM | POA: Diagnosis not present

## 2023-03-03 DIAGNOSIS — L598 Other specified disorders of the skin and subcutaneous tissue related to radiation: Secondary | ICD-10-CM | POA: Diagnosis not present

## 2023-03-03 DIAGNOSIS — N3041 Irradiation cystitis with hematuria: Secondary | ICD-10-CM | POA: Diagnosis not present

## 2023-03-03 DIAGNOSIS — R310 Gross hematuria: Secondary | ICD-10-CM | POA: Diagnosis not present

## 2023-03-03 DIAGNOSIS — Z95811 Presence of heart assist device: Secondary | ICD-10-CM | POA: Diagnosis not present

## 2023-03-04 DIAGNOSIS — N3041 Irradiation cystitis with hematuria: Secondary | ICD-10-CM | POA: Diagnosis not present

## 2023-03-04 DIAGNOSIS — L598 Other specified disorders of the skin and subcutaneous tissue related to radiation: Secondary | ICD-10-CM | POA: Diagnosis not present

## 2023-03-04 DIAGNOSIS — Y842 Radiological procedure and radiotherapy as the cause of abnormal reaction of the patient, or of later complication, without mention of misadventure at the time of the procedure: Secondary | ICD-10-CM | POA: Diagnosis not present

## 2023-03-04 DIAGNOSIS — Z95811 Presence of heart assist device: Secondary | ICD-10-CM | POA: Diagnosis not present

## 2023-03-05 DIAGNOSIS — Z95811 Presence of heart assist device: Secondary | ICD-10-CM | POA: Diagnosis not present

## 2023-03-05 DIAGNOSIS — I5022 Chronic systolic (congestive) heart failure: Secondary | ICD-10-CM | POA: Diagnosis not present

## 2023-03-06 DIAGNOSIS — Z95811 Presence of heart assist device: Secondary | ICD-10-CM | POA: Diagnosis not present

## 2023-03-07 DIAGNOSIS — N3041 Irradiation cystitis with hematuria: Secondary | ICD-10-CM | POA: Diagnosis not present

## 2023-03-07 DIAGNOSIS — L598 Other specified disorders of the skin and subcutaneous tissue related to radiation: Secondary | ICD-10-CM | POA: Diagnosis not present

## 2023-03-07 DIAGNOSIS — Y842 Radiological procedure and radiotherapy as the cause of abnormal reaction of the patient, or of later complication, without mention of misadventure at the time of the procedure: Secondary | ICD-10-CM | POA: Diagnosis not present

## 2023-03-07 DIAGNOSIS — Z95811 Presence of heart assist device: Secondary | ICD-10-CM | POA: Diagnosis not present

## 2023-03-07 DIAGNOSIS — R310 Gross hematuria: Secondary | ICD-10-CM | POA: Diagnosis not present

## 2023-03-08 DIAGNOSIS — L598 Other specified disorders of the skin and subcutaneous tissue related to radiation: Secondary | ICD-10-CM | POA: Diagnosis not present

## 2023-03-08 DIAGNOSIS — R310 Gross hematuria: Secondary | ICD-10-CM | POA: Diagnosis not present

## 2023-03-08 DIAGNOSIS — N3041 Irradiation cystitis with hematuria: Secondary | ICD-10-CM | POA: Diagnosis not present

## 2023-03-08 DIAGNOSIS — Z95811 Presence of heart assist device: Secondary | ICD-10-CM | POA: Diagnosis not present

## 2023-03-08 DIAGNOSIS — Y842 Radiological procedure and radiotherapy as the cause of abnormal reaction of the patient, or of later complication, without mention of misadventure at the time of the procedure: Secondary | ICD-10-CM | POA: Diagnosis not present

## 2023-03-09 DIAGNOSIS — N3041 Irradiation cystitis with hematuria: Secondary | ICD-10-CM | POA: Diagnosis not present

## 2023-03-09 DIAGNOSIS — Z95811 Presence of heart assist device: Secondary | ICD-10-CM | POA: Diagnosis not present

## 2023-03-09 DIAGNOSIS — L598 Other specified disorders of the skin and subcutaneous tissue related to radiation: Secondary | ICD-10-CM | POA: Diagnosis not present

## 2023-03-09 DIAGNOSIS — R310 Gross hematuria: Secondary | ICD-10-CM | POA: Diagnosis not present

## 2023-03-09 DIAGNOSIS — Y842 Radiological procedure and radiotherapy as the cause of abnormal reaction of the patient, or of later complication, without mention of misadventure at the time of the procedure: Secondary | ICD-10-CM | POA: Diagnosis not present

## 2023-03-10 DIAGNOSIS — R310 Gross hematuria: Secondary | ICD-10-CM | POA: Diagnosis not present

## 2023-03-10 DIAGNOSIS — N3041 Irradiation cystitis with hematuria: Secondary | ICD-10-CM | POA: Diagnosis not present

## 2023-03-10 DIAGNOSIS — N3289 Other specified disorders of bladder: Secondary | ICD-10-CM | POA: Diagnosis not present

## 2023-03-10 DIAGNOSIS — Y842 Radiological procedure and radiotherapy as the cause of abnormal reaction of the patient, or of later complication, without mention of misadventure at the time of the procedure: Secondary | ICD-10-CM | POA: Diagnosis not present

## 2023-03-10 DIAGNOSIS — R3989 Other symptoms and signs involving the genitourinary system: Secondary | ICD-10-CM | POA: Diagnosis not present

## 2023-03-10 DIAGNOSIS — Z95811 Presence of heart assist device: Secondary | ICD-10-CM | POA: Diagnosis not present

## 2023-03-10 DIAGNOSIS — L598 Other specified disorders of the skin and subcutaneous tissue related to radiation: Secondary | ICD-10-CM | POA: Diagnosis not present

## 2023-03-11 DIAGNOSIS — Y842 Radiological procedure and radiotherapy as the cause of abnormal reaction of the patient, or of later complication, without mention of misadventure at the time of the procedure: Secondary | ICD-10-CM | POA: Diagnosis not present

## 2023-03-11 DIAGNOSIS — L598 Other specified disorders of the skin and subcutaneous tissue related to radiation: Secondary | ICD-10-CM | POA: Diagnosis not present

## 2023-03-11 DIAGNOSIS — E119 Type 2 diabetes mellitus without complications: Secondary | ICD-10-CM | POA: Diagnosis not present

## 2023-03-11 DIAGNOSIS — Z95811 Presence of heart assist device: Secondary | ICD-10-CM | POA: Diagnosis not present

## 2023-03-11 DIAGNOSIS — N3041 Irradiation cystitis with hematuria: Secondary | ICD-10-CM | POA: Diagnosis not present

## 2023-03-12 DIAGNOSIS — I5022 Chronic systolic (congestive) heart failure: Secondary | ICD-10-CM | POA: Diagnosis not present

## 2023-03-12 DIAGNOSIS — Z95811 Presence of heart assist device: Secondary | ICD-10-CM | POA: Diagnosis not present

## 2023-03-12 DIAGNOSIS — R3989 Other symptoms and signs involving the genitourinary system: Secondary | ICD-10-CM | POA: Diagnosis not present

## 2023-03-12 DIAGNOSIS — N3041 Irradiation cystitis with hematuria: Secondary | ICD-10-CM | POA: Diagnosis not present

## 2023-03-12 DIAGNOSIS — R310 Gross hematuria: Secondary | ICD-10-CM | POA: Diagnosis not present

## 2023-03-13 DIAGNOSIS — R3989 Other symptoms and signs involving the genitourinary system: Secondary | ICD-10-CM | POA: Diagnosis not present

## 2023-03-13 DIAGNOSIS — R310 Gross hematuria: Secondary | ICD-10-CM | POA: Diagnosis not present

## 2023-03-13 DIAGNOSIS — I5022 Chronic systolic (congestive) heart failure: Secondary | ICD-10-CM | POA: Diagnosis not present

## 2023-03-13 DIAGNOSIS — Z95811 Presence of heart assist device: Secondary | ICD-10-CM | POA: Diagnosis not present

## 2023-03-13 DIAGNOSIS — N3041 Irradiation cystitis with hematuria: Secondary | ICD-10-CM | POA: Diagnosis not present

## 2023-03-14 DIAGNOSIS — I5022 Chronic systolic (congestive) heart failure: Secondary | ICD-10-CM | POA: Diagnosis not present

## 2023-03-14 DIAGNOSIS — Y842 Radiological procedure and radiotherapy as the cause of abnormal reaction of the patient, or of later complication, without mention of misadventure at the time of the procedure: Secondary | ICD-10-CM | POA: Diagnosis not present

## 2023-03-14 DIAGNOSIS — L598 Other specified disorders of the skin and subcutaneous tissue related to radiation: Secondary | ICD-10-CM | POA: Diagnosis not present

## 2023-03-14 DIAGNOSIS — Z95811 Presence of heart assist device: Secondary | ICD-10-CM | POA: Diagnosis not present

## 2023-03-14 DIAGNOSIS — R310 Gross hematuria: Secondary | ICD-10-CM | POA: Diagnosis not present

## 2023-03-14 DIAGNOSIS — R3989 Other symptoms and signs involving the genitourinary system: Secondary | ICD-10-CM | POA: Diagnosis not present

## 2023-03-14 DIAGNOSIS — N3041 Irradiation cystitis with hematuria: Secondary | ICD-10-CM | POA: Diagnosis not present

## 2023-03-15 DIAGNOSIS — Y842 Radiological procedure and radiotherapy as the cause of abnormal reaction of the patient, or of later complication, without mention of misadventure at the time of the procedure: Secondary | ICD-10-CM | POA: Diagnosis not present

## 2023-03-15 DIAGNOSIS — R310 Gross hematuria: Secondary | ICD-10-CM | POA: Diagnosis not present

## 2023-03-15 DIAGNOSIS — R3989 Other symptoms and signs involving the genitourinary system: Secondary | ICD-10-CM | POA: Diagnosis not present

## 2023-03-15 DIAGNOSIS — N3041 Irradiation cystitis with hematuria: Secondary | ICD-10-CM | POA: Diagnosis not present

## 2023-03-15 DIAGNOSIS — Z95811 Presence of heart assist device: Secondary | ICD-10-CM | POA: Diagnosis not present

## 2023-03-15 DIAGNOSIS — I5022 Chronic systolic (congestive) heart failure: Secondary | ICD-10-CM | POA: Diagnosis not present

## 2023-03-15 DIAGNOSIS — L598 Other specified disorders of the skin and subcutaneous tissue related to radiation: Secondary | ICD-10-CM | POA: Diagnosis not present

## 2023-03-16 DIAGNOSIS — N3041 Irradiation cystitis with hematuria: Secondary | ICD-10-CM | POA: Diagnosis not present

## 2023-03-16 DIAGNOSIS — L598 Other specified disorders of the skin and subcutaneous tissue related to radiation: Secondary | ICD-10-CM | POA: Diagnosis not present

## 2023-03-16 DIAGNOSIS — Y842 Radiological procedure and radiotherapy as the cause of abnormal reaction of the patient, or of later complication, without mention of misadventure at the time of the procedure: Secondary | ICD-10-CM | POA: Diagnosis not present

## 2023-03-16 DIAGNOSIS — I5022 Chronic systolic (congestive) heart failure: Secondary | ICD-10-CM | POA: Diagnosis not present

## 2023-03-16 DIAGNOSIS — Z95811 Presence of heart assist device: Secondary | ICD-10-CM | POA: Diagnosis not present

## 2023-03-17 DIAGNOSIS — Z95811 Presence of heart assist device: Secondary | ICD-10-CM | POA: Diagnosis not present

## 2023-03-17 DIAGNOSIS — Y842 Radiological procedure and radiotherapy as the cause of abnormal reaction of the patient, or of later complication, without mention of misadventure at the time of the procedure: Secondary | ICD-10-CM | POA: Diagnosis not present

## 2023-03-17 DIAGNOSIS — N3041 Irradiation cystitis with hematuria: Secondary | ICD-10-CM | POA: Diagnosis not present

## 2023-03-17 DIAGNOSIS — L598 Other specified disorders of the skin and subcutaneous tissue related to radiation: Secondary | ICD-10-CM | POA: Diagnosis not present

## 2023-03-17 DIAGNOSIS — I5022 Chronic systolic (congestive) heart failure: Secondary | ICD-10-CM | POA: Diagnosis not present

## 2023-03-18 DIAGNOSIS — L598 Other specified disorders of the skin and subcutaneous tissue related to radiation: Secondary | ICD-10-CM | POA: Diagnosis not present

## 2023-03-18 DIAGNOSIS — I5022 Chronic systolic (congestive) heart failure: Secondary | ICD-10-CM | POA: Diagnosis not present

## 2023-03-18 DIAGNOSIS — Y842 Radiological procedure and radiotherapy as the cause of abnormal reaction of the patient, or of later complication, without mention of misadventure at the time of the procedure: Secondary | ICD-10-CM | POA: Diagnosis not present

## 2023-03-18 DIAGNOSIS — N3041 Irradiation cystitis with hematuria: Secondary | ICD-10-CM | POA: Diagnosis not present

## 2023-03-18 DIAGNOSIS — Z95811 Presence of heart assist device: Secondary | ICD-10-CM | POA: Diagnosis not present

## 2023-03-19 DIAGNOSIS — R3989 Other symptoms and signs involving the genitourinary system: Secondary | ICD-10-CM | POA: Diagnosis not present

## 2023-03-19 DIAGNOSIS — Z95811 Presence of heart assist device: Secondary | ICD-10-CM | POA: Diagnosis not present

## 2023-03-19 DIAGNOSIS — I5022 Chronic systolic (congestive) heart failure: Secondary | ICD-10-CM | POA: Diagnosis not present

## 2023-03-19 DIAGNOSIS — R310 Gross hematuria: Secondary | ICD-10-CM | POA: Diagnosis not present

## 2023-03-19 DIAGNOSIS — R5381 Other malaise: Secondary | ICD-10-CM | POA: Diagnosis not present

## 2023-03-19 DIAGNOSIS — Y842 Radiological procedure and radiotherapy as the cause of abnormal reaction of the patient, or of later complication, without mention of misadventure at the time of the procedure: Secondary | ICD-10-CM | POA: Diagnosis not present

## 2023-03-20 DIAGNOSIS — I5022 Chronic systolic (congestive) heart failure: Secondary | ICD-10-CM | POA: Diagnosis not present

## 2023-03-20 DIAGNOSIS — Z95811 Presence of heart assist device: Secondary | ICD-10-CM | POA: Diagnosis not present

## 2023-03-20 DIAGNOSIS — Y842 Radiological procedure and radiotherapy as the cause of abnormal reaction of the patient, or of later complication, without mention of misadventure at the time of the procedure: Secondary | ICD-10-CM | POA: Diagnosis not present

## 2023-03-20 DIAGNOSIS — R310 Gross hematuria: Secondary | ICD-10-CM | POA: Diagnosis not present

## 2023-03-20 DIAGNOSIS — R5381 Other malaise: Secondary | ICD-10-CM | POA: Diagnosis not present

## 2023-03-20 DIAGNOSIS — J811 Chronic pulmonary edema: Secondary | ICD-10-CM | POA: Diagnosis not present

## 2023-03-21 DIAGNOSIS — Z95811 Presence of heart assist device: Secondary | ICD-10-CM | POA: Diagnosis not present

## 2023-03-21 DIAGNOSIS — R310 Gross hematuria: Secondary | ICD-10-CM | POA: Diagnosis not present

## 2023-03-21 DIAGNOSIS — Y842 Radiological procedure and radiotherapy as the cause of abnormal reaction of the patient, or of later complication, without mention of misadventure at the time of the procedure: Secondary | ICD-10-CM | POA: Diagnosis not present

## 2023-03-21 DIAGNOSIS — I5022 Chronic systolic (congestive) heart failure: Secondary | ICD-10-CM | POA: Diagnosis not present

## 2023-03-21 DIAGNOSIS — R5381 Other malaise: Secondary | ICD-10-CM | POA: Diagnosis not present

## 2023-03-22 DIAGNOSIS — L598 Other specified disorders of the skin and subcutaneous tissue related to radiation: Secondary | ICD-10-CM | POA: Diagnosis not present

## 2023-03-22 DIAGNOSIS — N3041 Irradiation cystitis with hematuria: Secondary | ICD-10-CM | POA: Diagnosis not present

## 2023-03-22 DIAGNOSIS — I5022 Chronic systolic (congestive) heart failure: Secondary | ICD-10-CM | POA: Diagnosis not present

## 2023-03-22 DIAGNOSIS — Z95811 Presence of heart assist device: Secondary | ICD-10-CM | POA: Diagnosis not present

## 2023-03-22 DIAGNOSIS — Y842 Radiological procedure and radiotherapy as the cause of abnormal reaction of the patient, or of later complication, without mention of misadventure at the time of the procedure: Secondary | ICD-10-CM | POA: Diagnosis not present

## 2023-03-22 DIAGNOSIS — R310 Gross hematuria: Secondary | ICD-10-CM | POA: Diagnosis not present

## 2023-03-22 DIAGNOSIS — R918 Other nonspecific abnormal finding of lung field: Secondary | ICD-10-CM | POA: Diagnosis not present

## 2023-03-22 DIAGNOSIS — R5381 Other malaise: Secondary | ICD-10-CM | POA: Diagnosis not present

## 2023-03-23 DIAGNOSIS — Z95811 Presence of heart assist device: Secondary | ICD-10-CM | POA: Diagnosis not present

## 2023-03-23 DIAGNOSIS — I5022 Chronic systolic (congestive) heart failure: Secondary | ICD-10-CM | POA: Diagnosis not present

## 2023-03-23 DIAGNOSIS — L598 Other specified disorders of the skin and subcutaneous tissue related to radiation: Secondary | ICD-10-CM | POA: Diagnosis not present

## 2023-03-23 DIAGNOSIS — R310 Gross hematuria: Secondary | ICD-10-CM | POA: Diagnosis not present

## 2023-03-23 DIAGNOSIS — R5381 Other malaise: Secondary | ICD-10-CM | POA: Diagnosis not present

## 2023-03-23 DIAGNOSIS — N3041 Irradiation cystitis with hematuria: Secondary | ICD-10-CM | POA: Diagnosis not present

## 2023-03-23 DIAGNOSIS — Y842 Radiological procedure and radiotherapy as the cause of abnormal reaction of the patient, or of later complication, without mention of misadventure at the time of the procedure: Secondary | ICD-10-CM | POA: Diagnosis not present

## 2023-03-24 DIAGNOSIS — N3041 Irradiation cystitis with hematuria: Secondary | ICD-10-CM | POA: Diagnosis not present

## 2023-03-24 DIAGNOSIS — Z95811 Presence of heart assist device: Secondary | ICD-10-CM | POA: Diagnosis not present

## 2023-03-24 DIAGNOSIS — L598 Other specified disorders of the skin and subcutaneous tissue related to radiation: Secondary | ICD-10-CM | POA: Diagnosis not present

## 2023-03-24 DIAGNOSIS — Y842 Radiological procedure and radiotherapy as the cause of abnormal reaction of the patient, or of later complication, without mention of misadventure at the time of the procedure: Secondary | ICD-10-CM | POA: Diagnosis not present

## 2023-03-25 DIAGNOSIS — L598 Other specified disorders of the skin and subcutaneous tissue related to radiation: Secondary | ICD-10-CM | POA: Diagnosis not present

## 2023-03-25 DIAGNOSIS — R310 Gross hematuria: Secondary | ICD-10-CM | POA: Diagnosis not present

## 2023-03-25 DIAGNOSIS — N3041 Irradiation cystitis with hematuria: Secondary | ICD-10-CM | POA: Diagnosis not present

## 2023-03-25 DIAGNOSIS — Z95811 Presence of heart assist device: Secondary | ICD-10-CM | POA: Diagnosis not present

## 2023-03-25 DIAGNOSIS — I5022 Chronic systolic (congestive) heart failure: Secondary | ICD-10-CM | POA: Diagnosis not present

## 2023-03-25 DIAGNOSIS — Y842 Radiological procedure and radiotherapy as the cause of abnormal reaction of the patient, or of later complication, without mention of misadventure at the time of the procedure: Secondary | ICD-10-CM | POA: Diagnosis not present

## 2023-03-25 DIAGNOSIS — R5381 Other malaise: Secondary | ICD-10-CM | POA: Diagnosis not present

## 2023-03-26 DIAGNOSIS — N3041 Irradiation cystitis with hematuria: Secondary | ICD-10-CM | POA: Diagnosis not present

## 2023-03-26 DIAGNOSIS — R5381 Other malaise: Secondary | ICD-10-CM | POA: Diagnosis not present

## 2023-03-26 DIAGNOSIS — Z95811 Presence of heart assist device: Secondary | ICD-10-CM | POA: Diagnosis not present

## 2023-03-26 DIAGNOSIS — I5022 Chronic systolic (congestive) heart failure: Secondary | ICD-10-CM | POA: Diagnosis not present

## 2023-03-26 DIAGNOSIS — R310 Gross hematuria: Secondary | ICD-10-CM | POA: Diagnosis not present

## 2023-03-26 DIAGNOSIS — Y842 Radiological procedure and radiotherapy as the cause of abnormal reaction of the patient, or of later complication, without mention of misadventure at the time of the procedure: Secondary | ICD-10-CM | POA: Diagnosis not present

## 2023-03-27 DIAGNOSIS — N3041 Irradiation cystitis with hematuria: Secondary | ICD-10-CM | POA: Diagnosis not present

## 2023-03-27 DIAGNOSIS — Z95811 Presence of heart assist device: Secondary | ICD-10-CM | POA: Diagnosis not present

## 2023-03-27 DIAGNOSIS — R310 Gross hematuria: Secondary | ICD-10-CM | POA: Diagnosis not present

## 2023-03-27 DIAGNOSIS — R5381 Other malaise: Secondary | ICD-10-CM | POA: Diagnosis not present

## 2023-03-27 DIAGNOSIS — I5022 Chronic systolic (congestive) heart failure: Secondary | ICD-10-CM | POA: Diagnosis not present

## 2023-03-27 DIAGNOSIS — Y842 Radiological procedure and radiotherapy as the cause of abnormal reaction of the patient, or of later complication, without mention of misadventure at the time of the procedure: Secondary | ICD-10-CM | POA: Diagnosis not present

## 2023-03-28 DIAGNOSIS — Y842 Radiological procedure and radiotherapy as the cause of abnormal reaction of the patient, or of later complication, without mention of misadventure at the time of the procedure: Secondary | ICD-10-CM | POA: Diagnosis not present

## 2023-03-28 DIAGNOSIS — I5022 Chronic systolic (congestive) heart failure: Secondary | ICD-10-CM | POA: Diagnosis not present

## 2023-03-28 DIAGNOSIS — R310 Gross hematuria: Secondary | ICD-10-CM | POA: Diagnosis not present

## 2023-03-28 DIAGNOSIS — Z95811 Presence of heart assist device: Secondary | ICD-10-CM | POA: Diagnosis not present

## 2023-03-28 DIAGNOSIS — N3041 Irradiation cystitis with hematuria: Secondary | ICD-10-CM | POA: Diagnosis not present

## 2023-03-29 DIAGNOSIS — R310 Gross hematuria: Secondary | ICD-10-CM | POA: Diagnosis not present

## 2023-03-29 DIAGNOSIS — Z95811 Presence of heart assist device: Secondary | ICD-10-CM | POA: Diagnosis not present

## 2023-03-29 DIAGNOSIS — I5022 Chronic systolic (congestive) heart failure: Secondary | ICD-10-CM | POA: Diagnosis not present

## 2023-03-29 DIAGNOSIS — Y842 Radiological procedure and radiotherapy as the cause of abnormal reaction of the patient, or of later complication, without mention of misadventure at the time of the procedure: Secondary | ICD-10-CM | POA: Diagnosis not present

## 2023-03-29 DIAGNOSIS — N3041 Irradiation cystitis with hematuria: Secondary | ICD-10-CM | POA: Diagnosis not present

## 2023-03-30 DIAGNOSIS — R310 Gross hematuria: Secondary | ICD-10-CM | POA: Diagnosis not present

## 2023-03-30 DIAGNOSIS — Z95811 Presence of heart assist device: Secondary | ICD-10-CM | POA: Diagnosis not present

## 2023-03-30 DIAGNOSIS — I5022 Chronic systolic (congestive) heart failure: Secondary | ICD-10-CM | POA: Diagnosis not present

## 2023-03-30 DIAGNOSIS — N3041 Irradiation cystitis with hematuria: Secondary | ICD-10-CM | POA: Diagnosis not present

## 2023-03-30 DIAGNOSIS — Y842 Radiological procedure and radiotherapy as the cause of abnormal reaction of the patient, or of later complication, without mention of misadventure at the time of the procedure: Secondary | ICD-10-CM | POA: Diagnosis not present

## 2023-03-31 DIAGNOSIS — I5022 Chronic systolic (congestive) heart failure: Secondary | ICD-10-CM | POA: Diagnosis not present

## 2023-03-31 DIAGNOSIS — N3041 Irradiation cystitis with hematuria: Secondary | ICD-10-CM | POA: Diagnosis not present

## 2023-03-31 DIAGNOSIS — J189 Pneumonia, unspecified organism: Secondary | ICD-10-CM | POA: Diagnosis not present

## 2023-03-31 DIAGNOSIS — Z95811 Presence of heart assist device: Secondary | ICD-10-CM | POA: Diagnosis not present

## 2023-03-31 DIAGNOSIS — Y842 Radiological procedure and radiotherapy as the cause of abnormal reaction of the patient, or of later complication, without mention of misadventure at the time of the procedure: Secondary | ICD-10-CM | POA: Diagnosis not present

## 2023-03-31 DIAGNOSIS — R310 Gross hematuria: Secondary | ICD-10-CM | POA: Diagnosis not present

## 2023-04-01 DIAGNOSIS — Y842 Radiological procedure and radiotherapy as the cause of abnormal reaction of the patient, or of later complication, without mention of misadventure at the time of the procedure: Secondary | ICD-10-CM | POA: Diagnosis not present

## 2023-04-01 DIAGNOSIS — N3041 Irradiation cystitis with hematuria: Secondary | ICD-10-CM | POA: Diagnosis not present

## 2023-04-01 DIAGNOSIS — Z95811 Presence of heart assist device: Secondary | ICD-10-CM | POA: Diagnosis not present

## 2023-04-01 DIAGNOSIS — R310 Gross hematuria: Secondary | ICD-10-CM | POA: Diagnosis not present

## 2023-04-01 DIAGNOSIS — I5022 Chronic systolic (congestive) heart failure: Secondary | ICD-10-CM | POA: Diagnosis not present

## 2023-04-02 DIAGNOSIS — R5381 Other malaise: Secondary | ICD-10-CM | POA: Diagnosis not present

## 2023-04-02 DIAGNOSIS — Z95811 Presence of heart assist device: Secondary | ICD-10-CM | POA: Diagnosis not present

## 2023-04-02 DIAGNOSIS — N3041 Irradiation cystitis with hematuria: Secondary | ICD-10-CM | POA: Diagnosis not present

## 2023-04-03 DIAGNOSIS — Z95811 Presence of heart assist device: Secondary | ICD-10-CM | POA: Diagnosis not present

## 2023-04-04 DIAGNOSIS — Z95811 Presence of heart assist device: Secondary | ICD-10-CM | POA: Diagnosis not present

## 2023-04-05 DIAGNOSIS — Z95811 Presence of heart assist device: Secondary | ICD-10-CM | POA: Diagnosis not present

## 2023-04-06 DIAGNOSIS — Z95811 Presence of heart assist device: Secondary | ICD-10-CM | POA: Diagnosis not present

## 2023-04-07 DIAGNOSIS — Z95811 Presence of heart assist device: Secondary | ICD-10-CM | POA: Diagnosis not present

## 2023-04-11 DIAGNOSIS — N183 Chronic kidney disease, stage 3 unspecified: Secondary | ICD-10-CM | POA: Diagnosis not present

## 2023-04-11 DIAGNOSIS — E1122 Type 2 diabetes mellitus with diabetic chronic kidney disease: Secondary | ICD-10-CM | POA: Diagnosis not present

## 2023-04-11 DIAGNOSIS — I255 Ischemic cardiomyopathy: Secondary | ICD-10-CM | POA: Diagnosis not present

## 2023-04-11 DIAGNOSIS — B952 Enterococcus as the cause of diseases classified elsewhere: Secondary | ICD-10-CM | POA: Diagnosis not present

## 2023-04-11 DIAGNOSIS — H53132 Sudden visual loss, left eye: Secondary | ICD-10-CM | POA: Diagnosis not present

## 2023-04-11 DIAGNOSIS — I951 Orthostatic hypotension: Secondary | ICD-10-CM | POA: Diagnosis not present

## 2023-04-11 DIAGNOSIS — I6523 Occlusion and stenosis of bilateral carotid arteries: Secondary | ICD-10-CM | POA: Diagnosis not present

## 2023-04-11 DIAGNOSIS — G453 Amaurosis fugax: Secondary | ICD-10-CM | POA: Diagnosis not present

## 2023-04-11 DIAGNOSIS — I480 Paroxysmal atrial fibrillation: Secondary | ICD-10-CM | POA: Diagnosis not present

## 2023-04-11 DIAGNOSIS — I5023 Acute on chronic systolic (congestive) heart failure: Secondary | ICD-10-CM | POA: Diagnosis not present

## 2023-04-11 DIAGNOSIS — I2510 Atherosclerotic heart disease of native coronary artery without angina pectoris: Secondary | ICD-10-CM | POA: Diagnosis not present

## 2023-04-11 DIAGNOSIS — I50810 Right heart failure, unspecified: Secondary | ICD-10-CM | POA: Diagnosis not present

## 2023-04-13 DIAGNOSIS — I2510 Atherosclerotic heart disease of native coronary artery without angina pectoris: Secondary | ICD-10-CM | POA: Diagnosis not present

## 2023-04-13 DIAGNOSIS — Z95810 Presence of automatic (implantable) cardiac defibrillator: Secondary | ICD-10-CM | POA: Diagnosis not present

## 2023-04-13 DIAGNOSIS — I255 Ischemic cardiomyopathy: Secondary | ICD-10-CM | POA: Diagnosis not present

## 2023-04-13 DIAGNOSIS — I951 Orthostatic hypotension: Secondary | ICD-10-CM | POA: Diagnosis not present

## 2023-04-13 DIAGNOSIS — I6523 Occlusion and stenosis of bilateral carotid arteries: Secondary | ICD-10-CM | POA: Diagnosis not present

## 2023-04-13 DIAGNOSIS — H53132 Sudden visual loss, left eye: Secondary | ICD-10-CM | POA: Diagnosis not present

## 2023-04-13 DIAGNOSIS — I5023 Acute on chronic systolic (congestive) heart failure: Secondary | ICD-10-CM | POA: Diagnosis not present

## 2023-04-13 DIAGNOSIS — I50810 Right heart failure, unspecified: Secondary | ICD-10-CM | POA: Diagnosis not present

## 2023-04-15 DIAGNOSIS — N3041 Irradiation cystitis with hematuria: Secondary | ICD-10-CM | POA: Diagnosis not present

## 2023-04-30 DIAGNOSIS — J189 Pneumonia, unspecified organism: Secondary | ICD-10-CM | POA: Diagnosis not present

## 2023-04-30 DIAGNOSIS — I5022 Chronic systolic (congestive) heart failure: Secondary | ICD-10-CM | POA: Diagnosis not present

## 2023-05-27 DIAGNOSIS — N1831 Chronic kidney disease, stage 3a: Secondary | ICD-10-CM | POA: Diagnosis not present

## 2023-05-27 DIAGNOSIS — Z87891 Personal history of nicotine dependence: Secondary | ICD-10-CM | POA: Diagnosis not present

## 2023-05-27 DIAGNOSIS — I5022 Chronic systolic (congestive) heart failure: Secondary | ICD-10-CM | POA: Diagnosis not present

## 2023-05-27 DIAGNOSIS — N3041 Irradiation cystitis with hematuria: Secondary | ICD-10-CM | POA: Diagnosis not present

## 2023-05-27 DIAGNOSIS — I130 Hypertensive heart and chronic kidney disease with heart failure and stage 1 through stage 4 chronic kidney disease, or unspecified chronic kidney disease: Secondary | ICD-10-CM | POA: Diagnosis not present

## 2023-05-27 DIAGNOSIS — E1122 Type 2 diabetes mellitus with diabetic chronic kidney disease: Secondary | ICD-10-CM | POA: Diagnosis not present

## 2023-05-27 DIAGNOSIS — Z95811 Presence of heart assist device: Secondary | ICD-10-CM | POA: Diagnosis not present

## 2023-05-31 DIAGNOSIS — I5022 Chronic systolic (congestive) heart failure: Secondary | ICD-10-CM | POA: Diagnosis not present

## 2023-05-31 DIAGNOSIS — J189 Pneumonia, unspecified organism: Secondary | ICD-10-CM | POA: Diagnosis not present

## 2023-06-06 DIAGNOSIS — I5023 Acute on chronic systolic (congestive) heart failure: Secondary | ICD-10-CM | POA: Diagnosis not present

## 2023-06-06 DIAGNOSIS — I4729 Other ventricular tachycardia: Secondary | ICD-10-CM | POA: Diagnosis not present

## 2023-06-06 DIAGNOSIS — E876 Hypokalemia: Secondary | ICD-10-CM | POA: Diagnosis not present

## 2023-06-08 DIAGNOSIS — E119 Type 2 diabetes mellitus without complications: Secondary | ICD-10-CM | POA: Diagnosis not present

## 2023-06-14 DIAGNOSIS — E876 Hypokalemia: Secondary | ICD-10-CM | POA: Diagnosis not present

## 2023-06-14 DIAGNOSIS — I5023 Acute on chronic systolic (congestive) heart failure: Secondary | ICD-10-CM | POA: Diagnosis not present

## 2023-06-14 DIAGNOSIS — I4729 Other ventricular tachycardia: Secondary | ICD-10-CM | POA: Diagnosis not present

## 2023-06-14 DIAGNOSIS — I5022 Chronic systolic (congestive) heart failure: Secondary | ICD-10-CM | POA: Diagnosis not present

## 2023-06-15 DIAGNOSIS — N1831 Chronic kidney disease, stage 3a: Secondary | ICD-10-CM | POA: Diagnosis not present

## 2023-06-15 DIAGNOSIS — E118 Type 2 diabetes mellitus with unspecified complications: Secondary | ICD-10-CM | POA: Diagnosis not present

## 2023-06-15 DIAGNOSIS — N3041 Irradiation cystitis with hematuria: Secondary | ICD-10-CM | POA: Diagnosis not present

## 2023-06-15 DIAGNOSIS — D508 Other iron deficiency anemias: Secondary | ICD-10-CM | POA: Diagnosis not present

## 2023-06-23 DIAGNOSIS — I2510 Atherosclerotic heart disease of native coronary artery without angina pectoris: Secondary | ICD-10-CM | POA: Diagnosis not present

## 2023-06-23 DIAGNOSIS — Z951 Presence of aortocoronary bypass graft: Secondary | ICD-10-CM | POA: Diagnosis not present

## 2023-06-23 DIAGNOSIS — I739 Peripheral vascular disease, unspecified: Secondary | ICD-10-CM | POA: Diagnosis not present

## 2023-06-23 DIAGNOSIS — I5023 Acute on chronic systolic (congestive) heart failure: Secondary | ICD-10-CM | POA: Diagnosis not present

## 2023-06-23 DIAGNOSIS — E785 Hyperlipidemia, unspecified: Secondary | ICD-10-CM | POA: Diagnosis not present

## 2023-06-23 DIAGNOSIS — I255 Ischemic cardiomyopathy: Secondary | ICD-10-CM | POA: Diagnosis not present

## 2023-06-23 DIAGNOSIS — I130 Hypertensive heart and chronic kidney disease with heart failure and stage 1 through stage 4 chronic kidney disease, or unspecified chronic kidney disease: Secondary | ICD-10-CM | POA: Diagnosis not present

## 2023-06-23 DIAGNOSIS — D631 Anemia in chronic kidney disease: Secondary | ICD-10-CM | POA: Diagnosis not present

## 2023-06-23 DIAGNOSIS — Z95811 Presence of heart assist device: Secondary | ICD-10-CM | POA: Diagnosis not present

## 2023-06-23 DIAGNOSIS — Z79899 Other long term (current) drug therapy: Secondary | ICD-10-CM | POA: Diagnosis not present

## 2023-06-23 DIAGNOSIS — N183 Chronic kidney disease, stage 3 unspecified: Secondary | ICD-10-CM | POA: Diagnosis not present

## 2023-06-23 DIAGNOSIS — Z95810 Presence of automatic (implantable) cardiac defibrillator: Secondary | ICD-10-CM | POA: Diagnosis not present

## 2023-06-23 DIAGNOSIS — I429 Cardiomyopathy, unspecified: Secondary | ICD-10-CM | POA: Diagnosis not present

## 2023-06-23 DIAGNOSIS — G4733 Obstructive sleep apnea (adult) (pediatric): Secondary | ICD-10-CM | POA: Diagnosis not present

## 2023-06-30 DIAGNOSIS — I5022 Chronic systolic (congestive) heart failure: Secondary | ICD-10-CM | POA: Diagnosis not present

## 2023-06-30 DIAGNOSIS — J189 Pneumonia, unspecified organism: Secondary | ICD-10-CM | POA: Diagnosis not present

## 2023-07-01 DIAGNOSIS — R351 Nocturia: Secondary | ICD-10-CM | POA: Diagnosis not present

## 2023-07-01 DIAGNOSIS — R300 Dysuria: Secondary | ICD-10-CM | POA: Diagnosis not present

## 2023-07-01 DIAGNOSIS — R399 Unspecified symptoms and signs involving the genitourinary system: Secondary | ICD-10-CM | POA: Diagnosis not present

## 2023-07-01 DIAGNOSIS — C61 Malignant neoplasm of prostate: Secondary | ICD-10-CM | POA: Diagnosis not present

## 2023-07-01 DIAGNOSIS — N3040 Irradiation cystitis without hematuria: Secondary | ICD-10-CM | POA: Diagnosis not present

## 2023-07-01 DIAGNOSIS — R310 Gross hematuria: Secondary | ICD-10-CM | POA: Diagnosis not present

## 2023-07-05 DIAGNOSIS — Z95811 Presence of heart assist device: Secondary | ICD-10-CM | POA: Diagnosis not present

## 2023-07-05 DIAGNOSIS — B952 Enterococcus as the cause of diseases classified elsewhere: Secondary | ICD-10-CM | POA: Diagnosis not present

## 2023-07-05 DIAGNOSIS — R7881 Bacteremia: Secondary | ICD-10-CM | POA: Diagnosis not present

## 2023-07-07 DIAGNOSIS — I6523 Occlusion and stenosis of bilateral carotid arteries: Secondary | ICD-10-CM | POA: Diagnosis not present

## 2023-07-07 DIAGNOSIS — I2510 Atherosclerotic heart disease of native coronary artery without angina pectoris: Secondary | ICD-10-CM | POA: Diagnosis not present

## 2023-07-07 DIAGNOSIS — I5023 Acute on chronic systolic (congestive) heart failure: Secondary | ICD-10-CM | POA: Diagnosis not present

## 2023-07-07 DIAGNOSIS — I50810 Right heart failure, unspecified: Secondary | ICD-10-CM | POA: Diagnosis not present

## 2023-07-07 DIAGNOSIS — I255 Ischemic cardiomyopathy: Secondary | ICD-10-CM | POA: Diagnosis not present

## 2023-07-07 DIAGNOSIS — I951 Orthostatic hypotension: Secondary | ICD-10-CM | POA: Diagnosis not present

## 2023-07-07 DIAGNOSIS — H53132 Sudden visual loss, left eye: Secondary | ICD-10-CM | POA: Diagnosis not present

## 2023-07-08 DIAGNOSIS — I5020 Unspecified systolic (congestive) heart failure: Secondary | ICD-10-CM | POA: Diagnosis not present

## 2023-07-08 DIAGNOSIS — Z6825 Body mass index (BMI) 25.0-25.9, adult: Secondary | ICD-10-CM | POA: Diagnosis not present

## 2023-07-08 DIAGNOSIS — G4733 Obstructive sleep apnea (adult) (pediatric): Secondary | ICD-10-CM | POA: Diagnosis not present

## 2023-07-08 DIAGNOSIS — Z95811 Presence of heart assist device: Secondary | ICD-10-CM | POA: Diagnosis not present

## 2023-07-08 DIAGNOSIS — Z515 Encounter for palliative care: Secondary | ICD-10-CM | POA: Diagnosis not present

## 2023-07-08 DIAGNOSIS — D692 Other nonthrombocytopenic purpura: Secondary | ICD-10-CM | POA: Diagnosis not present

## 2023-07-13 DIAGNOSIS — Z7901 Long term (current) use of anticoagulants: Secondary | ICD-10-CM | POA: Diagnosis not present

## 2023-07-13 DIAGNOSIS — I259 Chronic ischemic heart disease, unspecified: Secondary | ICD-10-CM | POA: Diagnosis not present

## 2023-07-13 DIAGNOSIS — Z95810 Presence of automatic (implantable) cardiac defibrillator: Secondary | ICD-10-CM | POA: Diagnosis not present

## 2023-07-13 DIAGNOSIS — I2510 Atherosclerotic heart disease of native coronary artery without angina pectoris: Secondary | ICD-10-CM | POA: Diagnosis not present

## 2023-07-13 DIAGNOSIS — I5022 Chronic systolic (congestive) heart failure: Secondary | ICD-10-CM | POA: Diagnosis not present

## 2023-07-13 DIAGNOSIS — Z951 Presence of aortocoronary bypass graft: Secondary | ICD-10-CM | POA: Diagnosis not present

## 2023-07-13 DIAGNOSIS — I5023 Acute on chronic systolic (congestive) heart failure: Secondary | ICD-10-CM | POA: Diagnosis not present

## 2023-07-13 DIAGNOSIS — Z79899 Other long term (current) drug therapy: Secondary | ICD-10-CM | POA: Diagnosis not present

## 2023-07-13 DIAGNOSIS — I4901 Ventricular fibrillation: Secondary | ICD-10-CM | POA: Diagnosis not present

## 2023-07-13 DIAGNOSIS — Z95811 Presence of heart assist device: Secondary | ICD-10-CM | POA: Diagnosis not present

## 2023-07-13 DIAGNOSIS — I255 Ischemic cardiomyopathy: Secondary | ICD-10-CM | POA: Diagnosis not present

## 2023-07-13 DIAGNOSIS — I4729 Other ventricular tachycardia: Secondary | ICD-10-CM | POA: Diagnosis not present

## 2023-07-15 DIAGNOSIS — R918 Other nonspecific abnormal finding of lung field: Secondary | ICD-10-CM | POA: Diagnosis not present

## 2023-07-15 DIAGNOSIS — Z1152 Encounter for screening for COVID-19: Secondary | ICD-10-CM | POA: Diagnosis not present

## 2023-07-15 DIAGNOSIS — E1151 Type 2 diabetes mellitus with diabetic peripheral angiopathy without gangrene: Secondary | ICD-10-CM | POA: Diagnosis not present

## 2023-07-15 DIAGNOSIS — Z8719 Personal history of other diseases of the digestive system: Secondary | ICD-10-CM | POA: Diagnosis not present

## 2023-07-15 DIAGNOSIS — J440 Chronic obstructive pulmonary disease with acute lower respiratory infection: Secondary | ICD-10-CM | POA: Diagnosis not present

## 2023-07-15 DIAGNOSIS — R42 Dizziness and giddiness: Secondary | ICD-10-CM | POA: Diagnosis not present

## 2023-07-15 DIAGNOSIS — R0602 Shortness of breath: Secondary | ICD-10-CM | POA: Diagnosis not present

## 2023-07-15 DIAGNOSIS — J90 Pleural effusion, not elsewhere classified: Secondary | ICD-10-CM | POA: Diagnosis not present

## 2023-07-15 DIAGNOSIS — I25810 Atherosclerosis of coronary artery bypass graft(s) without angina pectoris: Secondary | ICD-10-CM | POA: Diagnosis not present

## 2023-07-15 DIAGNOSIS — Z9889 Other specified postprocedural states: Secondary | ICD-10-CM | POA: Diagnosis not present

## 2023-07-15 DIAGNOSIS — I130 Hypertensive heart and chronic kidney disease with heart failure and stage 1 through stage 4 chronic kidney disease, or unspecified chronic kidney disease: Secondary | ICD-10-CM | POA: Diagnosis not present

## 2023-07-15 DIAGNOSIS — C61 Malignant neoplasm of prostate: Secondary | ICD-10-CM | POA: Diagnosis not present

## 2023-07-15 DIAGNOSIS — F32A Depression, unspecified: Secondary | ICD-10-CM | POA: Diagnosis not present

## 2023-07-15 DIAGNOSIS — I6523 Occlusion and stenosis of bilateral carotid arteries: Secondary | ICD-10-CM | POA: Diagnosis not present

## 2023-07-15 DIAGNOSIS — N3041 Irradiation cystitis with hematuria: Secondary | ICD-10-CM | POA: Diagnosis not present

## 2023-07-15 DIAGNOSIS — I519 Heart disease, unspecified: Secondary | ICD-10-CM | POA: Diagnosis not present

## 2023-07-15 DIAGNOSIS — K7689 Other specified diseases of liver: Secondary | ICD-10-CM | POA: Diagnosis not present

## 2023-07-15 DIAGNOSIS — R188 Other ascites: Secondary | ICD-10-CM | POA: Diagnosis not present

## 2023-07-15 DIAGNOSIS — N1832 Chronic kidney disease, stage 3b: Secondary | ICD-10-CM | POA: Diagnosis not present

## 2023-07-15 DIAGNOSIS — E785 Hyperlipidemia, unspecified: Secondary | ICD-10-CM | POA: Diagnosis not present

## 2023-07-15 DIAGNOSIS — J069 Acute upper respiratory infection, unspecified: Secondary | ICD-10-CM | POA: Diagnosis not present

## 2023-07-15 DIAGNOSIS — R1312 Dysphagia, oropharyngeal phase: Secondary | ICD-10-CM | POA: Diagnosis not present

## 2023-07-15 DIAGNOSIS — Z87891 Personal history of nicotine dependence: Secondary | ICD-10-CM | POA: Diagnosis not present

## 2023-07-15 DIAGNOSIS — I700 Atherosclerosis of aorta: Secondary | ICD-10-CM | POA: Diagnosis not present

## 2023-07-15 DIAGNOSIS — I2510 Atherosclerotic heart disease of native coronary artery without angina pectoris: Secondary | ICD-10-CM | POA: Diagnosis not present

## 2023-07-15 DIAGNOSIS — I517 Cardiomegaly: Secondary | ICD-10-CM | POA: Diagnosis not present

## 2023-07-15 DIAGNOSIS — J441 Chronic obstructive pulmonary disease with (acute) exacerbation: Secondary | ICD-10-CM | POA: Diagnosis not present

## 2023-07-15 DIAGNOSIS — D500 Iron deficiency anemia secondary to blood loss (chronic): Secondary | ICD-10-CM | POA: Diagnosis not present

## 2023-07-15 DIAGNOSIS — Z95811 Presence of heart assist device: Secondary | ICD-10-CM | POA: Diagnosis not present

## 2023-07-15 DIAGNOSIS — Z8616 Personal history of COVID-19: Secondary | ICD-10-CM | POA: Diagnosis not present

## 2023-07-15 DIAGNOSIS — I5023 Acute on chronic systolic (congestive) heart failure: Secondary | ICD-10-CM | POA: Diagnosis not present

## 2023-07-15 DIAGNOSIS — K439 Ventral hernia without obstruction or gangrene: Secondary | ICD-10-CM | POA: Diagnosis not present

## 2023-07-15 DIAGNOSIS — I480 Paroxysmal atrial fibrillation: Secondary | ICD-10-CM | POA: Diagnosis not present

## 2023-07-15 DIAGNOSIS — E1122 Type 2 diabetes mellitus with diabetic chronic kidney disease: Secondary | ICD-10-CM | POA: Diagnosis not present

## 2023-07-15 DIAGNOSIS — D631 Anemia in chronic kidney disease: Secondary | ICD-10-CM | POA: Diagnosis not present

## 2023-07-15 DIAGNOSIS — I255 Ischemic cardiomyopathy: Secondary | ICD-10-CM | POA: Diagnosis not present

## 2023-07-15 DIAGNOSIS — E1142 Type 2 diabetes mellitus with diabetic polyneuropathy: Secondary | ICD-10-CM | POA: Diagnosis not present

## 2023-07-18 DIAGNOSIS — R1312 Dysphagia, oropharyngeal phase: Secondary | ICD-10-CM | POA: Diagnosis not present

## 2023-07-27 DIAGNOSIS — I5042 Chronic combined systolic (congestive) and diastolic (congestive) heart failure: Secondary | ICD-10-CM | POA: Diagnosis not present

## 2023-07-27 DIAGNOSIS — N3041 Irradiation cystitis with hematuria: Secondary | ICD-10-CM | POA: Diagnosis not present

## 2023-07-27 DIAGNOSIS — N3080 Other cystitis without hematuria: Secondary | ICD-10-CM | POA: Diagnosis not present

## 2023-07-27 DIAGNOSIS — Z95811 Presence of heart assist device: Secondary | ICD-10-CM | POA: Diagnosis not present

## 2023-08-05 DIAGNOSIS — I5043 Acute on chronic combined systolic (congestive) and diastolic (congestive) heart failure: Secondary | ICD-10-CM | POA: Diagnosis not present

## 2023-08-10 DIAGNOSIS — I5023 Acute on chronic systolic (congestive) heart failure: Secondary | ICD-10-CM | POA: Diagnosis not present

## 2023-08-10 DIAGNOSIS — I50810 Right heart failure, unspecified: Secondary | ICD-10-CM | POA: Diagnosis not present

## 2023-08-10 DIAGNOSIS — I951 Orthostatic hypotension: Secondary | ICD-10-CM | POA: Diagnosis not present

## 2023-08-10 DIAGNOSIS — H53132 Sudden visual loss, left eye: Secondary | ICD-10-CM | POA: Diagnosis not present

## 2023-08-10 DIAGNOSIS — I255 Ischemic cardiomyopathy: Secondary | ICD-10-CM | POA: Diagnosis not present

## 2023-08-10 DIAGNOSIS — I2510 Atherosclerotic heart disease of native coronary artery without angina pectoris: Secondary | ICD-10-CM | POA: Diagnosis not present

## 2023-08-10 DIAGNOSIS — I6523 Occlusion and stenosis of bilateral carotid arteries: Secondary | ICD-10-CM | POA: Diagnosis not present

## 2023-08-16 DIAGNOSIS — N3041 Irradiation cystitis with hematuria: Secondary | ICD-10-CM | POA: Diagnosis not present

## 2023-08-25 DIAGNOSIS — N1832 Chronic kidney disease, stage 3b: Secondary | ICD-10-CM | POA: Diagnosis not present

## 2023-08-25 DIAGNOSIS — I5022 Chronic systolic (congestive) heart failure: Secondary | ICD-10-CM | POA: Diagnosis not present

## 2023-08-25 DIAGNOSIS — E876 Hypokalemia: Secondary | ICD-10-CM | POA: Diagnosis not present

## 2023-09-06 DIAGNOSIS — R197 Diarrhea, unspecified: Secondary | ICD-10-CM | POA: Diagnosis not present

## 2023-09-06 DIAGNOSIS — I509 Heart failure, unspecified: Secondary | ICD-10-CM | POA: Diagnosis not present

## 2023-09-06 DIAGNOSIS — R1032 Left lower quadrant pain: Secondary | ICD-10-CM | POA: Diagnosis not present

## 2023-09-06 DIAGNOSIS — R0789 Other chest pain: Secondary | ICD-10-CM | POA: Diagnosis not present

## 2023-09-06 DIAGNOSIS — N1832 Chronic kidney disease, stage 3b: Secondary | ICD-10-CM | POA: Diagnosis not present

## 2023-09-06 DIAGNOSIS — K922 Gastrointestinal hemorrhage, unspecified: Secondary | ICD-10-CM | POA: Diagnosis not present

## 2023-09-06 DIAGNOSIS — Z95811 Presence of heart assist device: Secondary | ICD-10-CM | POA: Diagnosis not present

## 2023-09-06 DIAGNOSIS — Z951 Presence of aortocoronary bypass graft: Secondary | ICD-10-CM | POA: Diagnosis not present

## 2023-09-06 DIAGNOSIS — I2510 Atherosclerotic heart disease of native coronary artery without angina pectoris: Secondary | ICD-10-CM | POA: Diagnosis not present

## 2023-09-06 DIAGNOSIS — R161 Splenomegaly, not elsewhere classified: Secondary | ICD-10-CM | POA: Diagnosis not present

## 2023-09-06 DIAGNOSIS — K625 Hemorrhage of anus and rectum: Secondary | ICD-10-CM | POA: Diagnosis not present

## 2023-09-06 DIAGNOSIS — K7689 Other specified diseases of liver: Secondary | ICD-10-CM | POA: Diagnosis not present

## 2023-09-06 DIAGNOSIS — E119 Type 2 diabetes mellitus without complications: Secondary | ICD-10-CM | POA: Diagnosis not present

## 2023-09-06 DIAGNOSIS — Z955 Presence of coronary angioplasty implant and graft: Secondary | ICD-10-CM | POA: Diagnosis not present

## 2023-09-06 DIAGNOSIS — N179 Acute kidney failure, unspecified: Secondary | ICD-10-CM | POA: Diagnosis not present

## 2023-09-07 DIAGNOSIS — N179 Acute kidney failure, unspecified: Secondary | ICD-10-CM | POA: Diagnosis not present

## 2023-09-07 DIAGNOSIS — Z951 Presence of aortocoronary bypass graft: Secondary | ICD-10-CM | POA: Diagnosis not present

## 2023-09-07 DIAGNOSIS — I509 Heart failure, unspecified: Secondary | ICD-10-CM | POA: Diagnosis not present

## 2023-09-07 DIAGNOSIS — K922 Gastrointestinal hemorrhage, unspecified: Secondary | ICD-10-CM | POA: Diagnosis not present

## 2023-09-07 DIAGNOSIS — N1832 Chronic kidney disease, stage 3b: Secondary | ICD-10-CM | POA: Diagnosis not present

## 2023-09-07 DIAGNOSIS — Z955 Presence of coronary angioplasty implant and graft: Secondary | ICD-10-CM | POA: Diagnosis not present

## 2023-09-07 DIAGNOSIS — I2510 Atherosclerotic heart disease of native coronary artery without angina pectoris: Secondary | ICD-10-CM | POA: Diagnosis not present

## 2023-09-07 DIAGNOSIS — Z95811 Presence of heart assist device: Secondary | ICD-10-CM | POA: Diagnosis not present

## 2023-09-08 DIAGNOSIS — Z951 Presence of aortocoronary bypass graft: Secondary | ICD-10-CM | POA: Diagnosis not present

## 2023-09-08 DIAGNOSIS — I509 Heart failure, unspecified: Secondary | ICD-10-CM | POA: Diagnosis not present

## 2023-09-08 DIAGNOSIS — Z95811 Presence of heart assist device: Secondary | ICD-10-CM | POA: Diagnosis not present

## 2023-09-08 DIAGNOSIS — K922 Gastrointestinal hemorrhage, unspecified: Secondary | ICD-10-CM | POA: Diagnosis not present

## 2023-09-08 DIAGNOSIS — N179 Acute kidney failure, unspecified: Secondary | ICD-10-CM | POA: Diagnosis not present

## 2023-09-08 DIAGNOSIS — N1832 Chronic kidney disease, stage 3b: Secondary | ICD-10-CM | POA: Diagnosis not present

## 2023-09-08 DIAGNOSIS — I2510 Atherosclerotic heart disease of native coronary artery without angina pectoris: Secondary | ICD-10-CM | POA: Diagnosis not present

## 2023-09-08 DIAGNOSIS — Z955 Presence of coronary angioplasty implant and graft: Secondary | ICD-10-CM | POA: Diagnosis not present

## 2023-09-09 DIAGNOSIS — I5040 Unspecified combined systolic (congestive) and diastolic (congestive) heart failure: Secondary | ICD-10-CM | POA: Diagnosis not present

## 2023-09-14 DIAGNOSIS — E876 Hypokalemia: Secondary | ICD-10-CM | POA: Diagnosis not present

## 2023-09-26 DIAGNOSIS — Z7901 Long term (current) use of anticoagulants: Secondary | ICD-10-CM | POA: Diagnosis not present

## 2023-09-26 DIAGNOSIS — I4729 Other ventricular tachycardia: Secondary | ICD-10-CM | POA: Diagnosis not present

## 2023-09-26 DIAGNOSIS — R351 Nocturia: Secondary | ICD-10-CM | POA: Diagnosis not present

## 2023-09-26 DIAGNOSIS — Z95811 Presence of heart assist device: Secondary | ICD-10-CM | POA: Diagnosis not present

## 2023-09-26 DIAGNOSIS — D508 Other iron deficiency anemias: Secondary | ICD-10-CM | POA: Diagnosis not present

## 2023-09-26 DIAGNOSIS — I2510 Atherosclerotic heart disease of native coronary artery without angina pectoris: Secondary | ICD-10-CM | POA: Diagnosis not present

## 2023-09-26 DIAGNOSIS — R413 Other amnesia: Secondary | ICD-10-CM | POA: Diagnosis not present

## 2023-09-26 DIAGNOSIS — I5022 Chronic systolic (congestive) heart failure: Secondary | ICD-10-CM | POA: Diagnosis not present

## 2023-09-26 DIAGNOSIS — I951 Orthostatic hypotension: Secondary | ICD-10-CM | POA: Diagnosis not present

## 2023-09-26 DIAGNOSIS — I5023 Acute on chronic systolic (congestive) heart failure: Secondary | ICD-10-CM | POA: Diagnosis not present

## 2023-09-26 DIAGNOSIS — N401 Enlarged prostate with lower urinary tract symptoms: Secondary | ICD-10-CM | POA: Diagnosis not present

## 2023-09-26 DIAGNOSIS — I4901 Ventricular fibrillation: Secondary | ICD-10-CM | POA: Diagnosis not present

## 2023-10-04 DIAGNOSIS — N401 Enlarged prostate with lower urinary tract symptoms: Secondary | ICD-10-CM | POA: Diagnosis not present

## 2023-10-04 DIAGNOSIS — R351 Nocturia: Secondary | ICD-10-CM | POA: Diagnosis not present

## 2023-10-04 DIAGNOSIS — C61 Malignant neoplasm of prostate: Secondary | ICD-10-CM | POA: Diagnosis not present

## 2023-10-04 DIAGNOSIS — R399 Unspecified symptoms and signs involving the genitourinary system: Secondary | ICD-10-CM | POA: Diagnosis not present

## 2023-11-15 DIAGNOSIS — I499 Cardiac arrhythmia, unspecified: Secondary | ICD-10-CM | POA: Diagnosis not present

## 2023-11-15 DIAGNOSIS — I2510 Atherosclerotic heart disease of native coronary artery without angina pectoris: Secondary | ICD-10-CM | POA: Diagnosis not present

## 2023-11-15 DIAGNOSIS — D631 Anemia in chronic kidney disease: Secondary | ICD-10-CM | POA: Diagnosis not present

## 2023-11-15 DIAGNOSIS — I130 Hypertensive heart and chronic kidney disease with heart failure and stage 1 through stage 4 chronic kidney disease, or unspecified chronic kidney disease: Secondary | ICD-10-CM | POA: Diagnosis not present

## 2023-11-15 DIAGNOSIS — I5022 Chronic systolic (congestive) heart failure: Secondary | ICD-10-CM | POA: Diagnosis not present

## 2023-11-15 DIAGNOSIS — T829XXD Unspecified complication of cardiac and vascular prosthetic device, implant and graft, subsequent encounter: Secondary | ICD-10-CM | POA: Diagnosis not present

## 2023-11-15 DIAGNOSIS — N183 Chronic kidney disease, stage 3 unspecified: Secondary | ICD-10-CM | POA: Diagnosis not present

## 2023-11-15 DIAGNOSIS — Z951 Presence of aortocoronary bypass graft: Secondary | ICD-10-CM | POA: Diagnosis not present

## 2023-11-15 DIAGNOSIS — R5383 Other fatigue: Secondary | ICD-10-CM | POA: Diagnosis not present

## 2023-11-15 DIAGNOSIS — C61 Malignant neoplasm of prostate: Secondary | ICD-10-CM | POA: Diagnosis not present

## 2023-11-15 DIAGNOSIS — E1122 Type 2 diabetes mellitus with diabetic chronic kidney disease: Secondary | ICD-10-CM | POA: Diagnosis not present

## 2023-11-15 DIAGNOSIS — E1140 Type 2 diabetes mellitus with diabetic neuropathy, unspecified: Secondary | ICD-10-CM | POA: Diagnosis not present

## 2023-11-15 DIAGNOSIS — E785 Hyperlipidemia, unspecified: Secondary | ICD-10-CM | POA: Diagnosis not present

## 2023-11-15 DIAGNOSIS — D649 Anemia, unspecified: Secondary | ICD-10-CM | POA: Diagnosis not present

## 2023-11-15 DIAGNOSIS — R0602 Shortness of breath: Secondary | ICD-10-CM | POA: Diagnosis not present

## 2023-11-15 DIAGNOSIS — Z95811 Presence of heart assist device: Secondary | ICD-10-CM | POA: Diagnosis not present

## 2023-11-16 DIAGNOSIS — R5383 Other fatigue: Secondary | ICD-10-CM | POA: Diagnosis not present

## 2023-11-16 DIAGNOSIS — K921 Melena: Secondary | ICD-10-CM | POA: Diagnosis not present

## 2023-11-16 DIAGNOSIS — I499 Cardiac arrhythmia, unspecified: Secondary | ICD-10-CM | POA: Diagnosis not present

## 2023-11-16 DIAGNOSIS — I5022 Chronic systolic (congestive) heart failure: Secondary | ICD-10-CM | POA: Diagnosis not present

## 2023-11-16 DIAGNOSIS — I130 Hypertensive heart and chronic kidney disease with heart failure and stage 1 through stage 4 chronic kidney disease, or unspecified chronic kidney disease: Secondary | ICD-10-CM | POA: Diagnosis not present

## 2023-11-16 DIAGNOSIS — T829XXD Unspecified complication of cardiac and vascular prosthetic device, implant and graft, subsequent encounter: Secondary | ICD-10-CM | POA: Diagnosis not present

## 2023-11-16 DIAGNOSIS — K3189 Other diseases of stomach and duodenum: Secondary | ICD-10-CM | POA: Diagnosis not present

## 2023-11-16 DIAGNOSIS — D631 Anemia in chronic kidney disease: Secondary | ICD-10-CM | POA: Diagnosis not present

## 2023-11-16 DIAGNOSIS — N183 Chronic kidney disease, stage 3 unspecified: Secondary | ICD-10-CM | POA: Diagnosis not present

## 2023-11-16 DIAGNOSIS — R0602 Shortness of breath: Secondary | ICD-10-CM | POA: Diagnosis not present

## 2023-11-16 DIAGNOSIS — E785 Hyperlipidemia, unspecified: Secondary | ICD-10-CM | POA: Diagnosis not present

## 2023-11-16 DIAGNOSIS — E1122 Type 2 diabetes mellitus with diabetic chronic kidney disease: Secondary | ICD-10-CM | POA: Diagnosis not present

## 2023-11-16 DIAGNOSIS — K31811 Angiodysplasia of stomach and duodenum with bleeding: Secondary | ICD-10-CM | POA: Diagnosis not present

## 2023-11-16 DIAGNOSIS — Z8719 Personal history of other diseases of the digestive system: Secondary | ICD-10-CM | POA: Diagnosis not present

## 2023-11-16 DIAGNOSIS — C61 Malignant neoplasm of prostate: Secondary | ICD-10-CM | POA: Diagnosis not present

## 2023-11-16 DIAGNOSIS — E1140 Type 2 diabetes mellitus with diabetic neuropathy, unspecified: Secondary | ICD-10-CM | POA: Diagnosis not present

## 2023-11-16 DIAGNOSIS — K5520 Angiodysplasia of colon without hemorrhage: Secondary | ICD-10-CM | POA: Diagnosis not present

## 2023-11-17 DIAGNOSIS — T829XXD Unspecified complication of cardiac and vascular prosthetic device, implant and graft, subsequent encounter: Secondary | ICD-10-CM | POA: Diagnosis not present

## 2023-11-17 DIAGNOSIS — I499 Cardiac arrhythmia, unspecified: Secondary | ICD-10-CM | POA: Diagnosis not present

## 2023-11-17 DIAGNOSIS — R5383 Other fatigue: Secondary | ICD-10-CM | POA: Diagnosis not present

## 2023-11-17 DIAGNOSIS — N183 Chronic kidney disease, stage 3 unspecified: Secondary | ICD-10-CM | POA: Diagnosis not present

## 2023-11-17 DIAGNOSIS — I130 Hypertensive heart and chronic kidney disease with heart failure and stage 1 through stage 4 chronic kidney disease, or unspecified chronic kidney disease: Secondary | ICD-10-CM | POA: Diagnosis not present

## 2023-11-17 DIAGNOSIS — C61 Malignant neoplasm of prostate: Secondary | ICD-10-CM | POA: Diagnosis not present

## 2023-11-17 DIAGNOSIS — I5022 Chronic systolic (congestive) heart failure: Secondary | ICD-10-CM | POA: Diagnosis not present

## 2023-11-17 DIAGNOSIS — E785 Hyperlipidemia, unspecified: Secondary | ICD-10-CM | POA: Diagnosis not present

## 2023-11-17 DIAGNOSIS — I340 Nonrheumatic mitral (valve) insufficiency: Secondary | ICD-10-CM | POA: Diagnosis not present

## 2023-11-17 DIAGNOSIS — E1122 Type 2 diabetes mellitus with diabetic chronic kidney disease: Secondary | ICD-10-CM | POA: Diagnosis not present

## 2023-11-17 DIAGNOSIS — I071 Rheumatic tricuspid insufficiency: Secondary | ICD-10-CM | POA: Diagnosis not present

## 2023-11-17 DIAGNOSIS — R0602 Shortness of breath: Secondary | ICD-10-CM | POA: Diagnosis not present

## 2023-11-17 DIAGNOSIS — D631 Anemia in chronic kidney disease: Secondary | ICD-10-CM | POA: Diagnosis not present

## 2023-11-17 DIAGNOSIS — E1140 Type 2 diabetes mellitus with diabetic neuropathy, unspecified: Secondary | ICD-10-CM | POA: Diagnosis not present

## 2023-11-22 DIAGNOSIS — Z8719 Personal history of other diseases of the digestive system: Secondary | ICD-10-CM | POA: Diagnosis not present

## 2023-11-22 DIAGNOSIS — F329 Major depressive disorder, single episode, unspecified: Secondary | ICD-10-CM | POA: Diagnosis not present

## 2023-11-22 DIAGNOSIS — I509 Heart failure, unspecified: Secondary | ICD-10-CM | POA: Diagnosis not present

## 2023-12-05 DIAGNOSIS — E119 Type 2 diabetes mellitus without complications: Secondary | ICD-10-CM | POA: Diagnosis not present

## 2023-12-06 DIAGNOSIS — E8770 Fluid overload, unspecified: Secondary | ICD-10-CM | POA: Diagnosis not present

## 2023-12-06 DIAGNOSIS — R918 Other nonspecific abnormal finding of lung field: Secondary | ICD-10-CM | POA: Diagnosis not present

## 2023-12-06 DIAGNOSIS — R0602 Shortness of breath: Secondary | ICD-10-CM | POA: Diagnosis not present

## 2023-12-06 DIAGNOSIS — I447 Left bundle-branch block, unspecified: Secondary | ICD-10-CM | POA: Diagnosis not present

## 2023-12-06 DIAGNOSIS — R079 Chest pain, unspecified: Secondary | ICD-10-CM | POA: Diagnosis not present

## 2023-12-07 DIAGNOSIS — Z8679 Personal history of other diseases of the circulatory system: Secondary | ICD-10-CM | POA: Diagnosis not present

## 2023-12-07 DIAGNOSIS — N400 Enlarged prostate without lower urinary tract symptoms: Secondary | ICD-10-CM | POA: Diagnosis not present

## 2023-12-07 DIAGNOSIS — Z95811 Presence of heart assist device: Secondary | ICD-10-CM | POA: Diagnosis not present

## 2023-12-07 DIAGNOSIS — I5023 Acute on chronic systolic (congestive) heart failure: Secondary | ICD-10-CM | POA: Diagnosis not present

## 2023-12-07 DIAGNOSIS — Z7901 Long term (current) use of anticoagulants: Secondary | ICD-10-CM | POA: Diagnosis not present

## 2023-12-07 DIAGNOSIS — I255 Ischemic cardiomyopathy: Secondary | ICD-10-CM | POA: Diagnosis not present

## 2023-12-07 DIAGNOSIS — N179 Acute kidney failure, unspecified: Secondary | ICD-10-CM | POA: Diagnosis not present

## 2023-12-07 DIAGNOSIS — Z8719 Personal history of other diseases of the digestive system: Secondary | ICD-10-CM | POA: Diagnosis not present

## 2023-12-07 DIAGNOSIS — Z87898 Personal history of other specified conditions: Secondary | ICD-10-CM | POA: Diagnosis not present

## 2023-12-07 DIAGNOSIS — J9621 Acute and chronic respiratory failure with hypoxia: Secondary | ICD-10-CM | POA: Diagnosis not present

## 2023-12-07 DIAGNOSIS — Z95818 Presence of other cardiac implants and grafts: Secondary | ICD-10-CM | POA: Diagnosis not present

## 2023-12-07 DIAGNOSIS — D649 Anemia, unspecified: Secondary | ICD-10-CM | POA: Diagnosis not present

## 2023-12-08 DIAGNOSIS — Z87898 Personal history of other specified conditions: Secondary | ICD-10-CM | POA: Diagnosis not present

## 2023-12-08 DIAGNOSIS — D500 Iron deficiency anemia secondary to blood loss (chronic): Secondary | ICD-10-CM | POA: Diagnosis not present

## 2023-12-08 DIAGNOSIS — J9621 Acute and chronic respiratory failure with hypoxia: Secondary | ICD-10-CM | POA: Diagnosis not present

## 2023-12-08 DIAGNOSIS — N179 Acute kidney failure, unspecified: Secondary | ICD-10-CM | POA: Diagnosis not present

## 2023-12-08 DIAGNOSIS — R188 Other ascites: Secondary | ICD-10-CM | POA: Diagnosis not present

## 2023-12-08 DIAGNOSIS — I5023 Acute on chronic systolic (congestive) heart failure: Secondary | ICD-10-CM | POA: Diagnosis not present

## 2023-12-08 DIAGNOSIS — I255 Ischemic cardiomyopathy: Secondary | ICD-10-CM | POA: Diagnosis not present

## 2023-12-08 DIAGNOSIS — Z8679 Personal history of other diseases of the circulatory system: Secondary | ICD-10-CM | POA: Diagnosis not present

## 2023-12-08 DIAGNOSIS — N400 Enlarged prostate without lower urinary tract symptoms: Secondary | ICD-10-CM | POA: Diagnosis not present

## 2023-12-08 DIAGNOSIS — Z95811 Presence of heart assist device: Secondary | ICD-10-CM | POA: Diagnosis not present

## 2023-12-08 DIAGNOSIS — Z8719 Personal history of other diseases of the digestive system: Secondary | ICD-10-CM | POA: Diagnosis not present

## 2023-12-08 DIAGNOSIS — Z95818 Presence of other cardiac implants and grafts: Secondary | ICD-10-CM | POA: Diagnosis not present

## 2023-12-09 DIAGNOSIS — Z95818 Presence of other cardiac implants and grafts: Secondary | ICD-10-CM | POA: Diagnosis not present

## 2023-12-09 DIAGNOSIS — N179 Acute kidney failure, unspecified: Secondary | ICD-10-CM | POA: Diagnosis not present

## 2023-12-09 DIAGNOSIS — I5023 Acute on chronic systolic (congestive) heart failure: Secondary | ICD-10-CM | POA: Diagnosis not present

## 2023-12-09 DIAGNOSIS — Z8679 Personal history of other diseases of the circulatory system: Secondary | ICD-10-CM | POA: Diagnosis not present

## 2023-12-09 DIAGNOSIS — Z87898 Personal history of other specified conditions: Secondary | ICD-10-CM | POA: Diagnosis not present

## 2023-12-09 DIAGNOSIS — I255 Ischemic cardiomyopathy: Secondary | ICD-10-CM | POA: Diagnosis not present

## 2023-12-09 DIAGNOSIS — R188 Other ascites: Secondary | ICD-10-CM | POA: Diagnosis not present

## 2023-12-09 DIAGNOSIS — Z8719 Personal history of other diseases of the digestive system: Secondary | ICD-10-CM | POA: Diagnosis not present

## 2023-12-09 DIAGNOSIS — D500 Iron deficiency anemia secondary to blood loss (chronic): Secondary | ICD-10-CM | POA: Diagnosis not present

## 2023-12-09 DIAGNOSIS — N400 Enlarged prostate without lower urinary tract symptoms: Secondary | ICD-10-CM | POA: Diagnosis not present

## 2023-12-09 DIAGNOSIS — J9621 Acute and chronic respiratory failure with hypoxia: Secondary | ICD-10-CM | POA: Diagnosis not present

## 2023-12-09 DIAGNOSIS — Z95811 Presence of heart assist device: Secondary | ICD-10-CM | POA: Diagnosis not present

## 2023-12-10 DIAGNOSIS — N183 Chronic kidney disease, stage 3 unspecified: Secondary | ICD-10-CM | POA: Diagnosis not present

## 2023-12-10 DIAGNOSIS — R0789 Other chest pain: Secondary | ICD-10-CM | POA: Diagnosis not present

## 2023-12-10 DIAGNOSIS — Z95811 Presence of heart assist device: Secondary | ICD-10-CM | POA: Diagnosis not present

## 2023-12-10 DIAGNOSIS — E785 Hyperlipidemia, unspecified: Secondary | ICD-10-CM | POA: Diagnosis not present

## 2023-12-10 DIAGNOSIS — J9601 Acute respiratory failure with hypoxia: Secondary | ICD-10-CM | POA: Diagnosis not present

## 2023-12-10 DIAGNOSIS — N179 Acute kidney failure, unspecified: Secondary | ICD-10-CM | POA: Diagnosis not present

## 2023-12-10 DIAGNOSIS — I10 Essential (primary) hypertension: Secondary | ICD-10-CM | POA: Diagnosis not present

## 2023-12-10 DIAGNOSIS — I5023 Acute on chronic systolic (congestive) heart failure: Secondary | ICD-10-CM | POA: Diagnosis not present

## 2023-12-11 DIAGNOSIS — R0789 Other chest pain: Secondary | ICD-10-CM | POA: Diagnosis not present

## 2023-12-11 DIAGNOSIS — Z95811 Presence of heart assist device: Secondary | ICD-10-CM | POA: Diagnosis not present

## 2023-12-11 DIAGNOSIS — I10 Essential (primary) hypertension: Secondary | ICD-10-CM | POA: Diagnosis not present

## 2023-12-11 DIAGNOSIS — N183 Chronic kidney disease, stage 3 unspecified: Secondary | ICD-10-CM | POA: Diagnosis not present

## 2023-12-11 DIAGNOSIS — N179 Acute kidney failure, unspecified: Secondary | ICD-10-CM | POA: Diagnosis not present

## 2023-12-11 DIAGNOSIS — E785 Hyperlipidemia, unspecified: Secondary | ICD-10-CM | POA: Diagnosis not present

## 2023-12-11 DIAGNOSIS — J9601 Acute respiratory failure with hypoxia: Secondary | ICD-10-CM | POA: Diagnosis not present

## 2023-12-11 DIAGNOSIS — I5023 Acute on chronic systolic (congestive) heart failure: Secondary | ICD-10-CM | POA: Diagnosis not present

## 2023-12-12 DIAGNOSIS — I4901 Ventricular fibrillation: Secondary | ICD-10-CM | POA: Diagnosis not present

## 2023-12-12 DIAGNOSIS — N400 Enlarged prostate without lower urinary tract symptoms: Secondary | ICD-10-CM | POA: Diagnosis not present

## 2023-12-12 DIAGNOSIS — Q2730 Arteriovenous malformation, site unspecified: Secondary | ICD-10-CM | POA: Diagnosis not present

## 2023-12-12 DIAGNOSIS — I472 Ventricular tachycardia, unspecified: Secondary | ICD-10-CM | POA: Diagnosis not present

## 2023-12-12 DIAGNOSIS — K922 Gastrointestinal hemorrhage, unspecified: Secondary | ICD-10-CM | POA: Diagnosis not present

## 2023-12-12 DIAGNOSIS — N183 Chronic kidney disease, stage 3 unspecified: Secondary | ICD-10-CM | POA: Diagnosis not present

## 2023-12-12 DIAGNOSIS — I5023 Acute on chronic systolic (congestive) heart failure: Secondary | ICD-10-CM | POA: Diagnosis not present

## 2023-12-12 DIAGNOSIS — D631 Anemia in chronic kidney disease: Secondary | ICD-10-CM | POA: Diagnosis not present

## 2023-12-12 DIAGNOSIS — J9601 Acute respiratory failure with hypoxia: Secondary | ICD-10-CM | POA: Diagnosis not present

## 2023-12-12 DIAGNOSIS — I255 Ischemic cardiomyopathy: Secondary | ICD-10-CM | POA: Diagnosis not present

## 2023-12-12 DIAGNOSIS — I130 Hypertensive heart and chronic kidney disease with heart failure and stage 1 through stage 4 chronic kidney disease, or unspecified chronic kidney disease: Secondary | ICD-10-CM | POA: Diagnosis not present

## 2023-12-12 DIAGNOSIS — E1122 Type 2 diabetes mellitus with diabetic chronic kidney disease: Secondary | ICD-10-CM | POA: Diagnosis not present

## 2023-12-15 DIAGNOSIS — J9601 Acute respiratory failure with hypoxia: Secondary | ICD-10-CM | POA: Diagnosis not present

## 2023-12-15 DIAGNOSIS — E1122 Type 2 diabetes mellitus with diabetic chronic kidney disease: Secondary | ICD-10-CM | POA: Diagnosis not present

## 2023-12-15 DIAGNOSIS — I472 Ventricular tachycardia, unspecified: Secondary | ICD-10-CM | POA: Diagnosis not present

## 2023-12-15 DIAGNOSIS — I5023 Acute on chronic systolic (congestive) heart failure: Secondary | ICD-10-CM | POA: Diagnosis not present

## 2023-12-15 DIAGNOSIS — I4901 Ventricular fibrillation: Secondary | ICD-10-CM | POA: Diagnosis not present

## 2023-12-15 DIAGNOSIS — I255 Ischemic cardiomyopathy: Secondary | ICD-10-CM | POA: Diagnosis not present

## 2023-12-15 DIAGNOSIS — N183 Chronic kidney disease, stage 3 unspecified: Secondary | ICD-10-CM | POA: Diagnosis not present

## 2023-12-15 DIAGNOSIS — I130 Hypertensive heart and chronic kidney disease with heart failure and stage 1 through stage 4 chronic kidney disease, or unspecified chronic kidney disease: Secondary | ICD-10-CM | POA: Diagnosis not present

## 2023-12-15 DIAGNOSIS — E785 Hyperlipidemia, unspecified: Secondary | ICD-10-CM | POA: Diagnosis not present

## 2023-12-15 DIAGNOSIS — N179 Acute kidney failure, unspecified: Secondary | ICD-10-CM | POA: Diagnosis not present

## 2023-12-27 DIAGNOSIS — C61 Malignant neoplasm of prostate: Secondary | ICD-10-CM | POA: Diagnosis not present

## 2023-12-27 DIAGNOSIS — D696 Thrombocytopenia, unspecified: Secondary | ICD-10-CM | POA: Diagnosis not present

## 2023-12-27 DIAGNOSIS — D508 Other iron deficiency anemias: Secondary | ICD-10-CM | POA: Diagnosis not present

## 2024-01-12 DIAGNOSIS — Z79899 Other long term (current) drug therapy: Secondary | ICD-10-CM | POA: Diagnosis not present

## 2024-01-12 DIAGNOSIS — I4901 Ventricular fibrillation: Secondary | ICD-10-CM | POA: Diagnosis not present

## 2024-01-12 DIAGNOSIS — I5023 Acute on chronic systolic (congestive) heart failure: Secondary | ICD-10-CM | POA: Diagnosis not present

## 2024-01-12 DIAGNOSIS — I2510 Atherosclerotic heart disease of native coronary artery without angina pectoris: Secondary | ICD-10-CM | POA: Diagnosis not present

## 2024-01-12 DIAGNOSIS — Z7901 Long term (current) use of anticoagulants: Secondary | ICD-10-CM | POA: Diagnosis not present

## 2024-01-12 DIAGNOSIS — I259 Chronic ischemic heart disease, unspecified: Secondary | ICD-10-CM | POA: Diagnosis not present

## 2024-01-12 DIAGNOSIS — I4729 Other ventricular tachycardia: Secondary | ICD-10-CM | POA: Diagnosis not present

## 2024-01-12 DIAGNOSIS — I5022 Chronic systolic (congestive) heart failure: Secondary | ICD-10-CM | POA: Diagnosis not present

## 2024-01-12 DIAGNOSIS — I255 Ischemic cardiomyopathy: Secondary | ICD-10-CM | POA: Diagnosis not present

## 2024-01-12 DIAGNOSIS — Z95810 Presence of automatic (implantable) cardiac defibrillator: Secondary | ICD-10-CM | POA: Diagnosis not present

## 2024-01-12 DIAGNOSIS — J441 Chronic obstructive pulmonary disease with (acute) exacerbation: Secondary | ICD-10-CM | POA: Diagnosis not present

## 2024-01-12 DIAGNOSIS — Z95811 Presence of heart assist device: Secondary | ICD-10-CM | POA: Diagnosis not present

## 2024-01-17 DIAGNOSIS — N401 Enlarged prostate with lower urinary tract symptoms: Secondary | ICD-10-CM | POA: Diagnosis not present

## 2024-01-17 DIAGNOSIS — I951 Orthostatic hypotension: Secondary | ICD-10-CM | POA: Diagnosis not present

## 2024-01-17 DIAGNOSIS — C61 Malignant neoplasm of prostate: Secondary | ICD-10-CM | POA: Diagnosis not present

## 2024-01-17 DIAGNOSIS — I5022 Chronic systolic (congestive) heart failure: Secondary | ICD-10-CM | POA: Diagnosis not present

## 2024-01-17 DIAGNOSIS — I519 Heart disease, unspecified: Secondary | ICD-10-CM | POA: Diagnosis not present

## 2024-01-17 DIAGNOSIS — Z8719 Personal history of other diseases of the digestive system: Secondary | ICD-10-CM | POA: Diagnosis not present

## 2024-01-17 DIAGNOSIS — K922 Gastrointestinal hemorrhage, unspecified: Secondary | ICD-10-CM | POA: Diagnosis not present

## 2024-01-17 DIAGNOSIS — I4901 Ventricular fibrillation: Secondary | ICD-10-CM | POA: Diagnosis not present

## 2024-01-17 DIAGNOSIS — Z95811 Presence of heart assist device: Secondary | ICD-10-CM | POA: Diagnosis not present

## 2024-01-17 DIAGNOSIS — Z7901 Long term (current) use of anticoagulants: Secondary | ICD-10-CM | POA: Diagnosis not present

## 2024-01-17 DIAGNOSIS — I5084 End stage heart failure: Secondary | ICD-10-CM | POA: Diagnosis not present

## 2024-01-17 DIAGNOSIS — I4729 Other ventricular tachycardia: Secondary | ICD-10-CM | POA: Diagnosis not present

## 2024-03-06 DIAGNOSIS — B952 Enterococcus as the cause of diseases classified elsewhere: Secondary | ICD-10-CM | POA: Diagnosis not present

## 2024-03-06 DIAGNOSIS — Z95811 Presence of heart assist device: Secondary | ICD-10-CM | POA: Diagnosis not present

## 2024-03-06 DIAGNOSIS — R7881 Bacteremia: Secondary | ICD-10-CM | POA: Diagnosis not present

## 2024-03-06 DIAGNOSIS — A498 Other bacterial infections of unspecified site: Secondary | ICD-10-CM | POA: Diagnosis not present

## 2024-03-06 DIAGNOSIS — Z792 Long term (current) use of antibiotics: Secondary | ICD-10-CM | POA: Diagnosis not present

## 2024-03-13 DIAGNOSIS — I5022 Chronic systolic (congestive) heart failure: Secondary | ICD-10-CM | POA: Diagnosis not present

## 2024-03-13 DIAGNOSIS — E876 Hypokalemia: Secondary | ICD-10-CM | POA: Diagnosis not present

## 2024-03-30 DIAGNOSIS — K551 Chronic vascular disorders of intestine: Secondary | ICD-10-CM | POA: Diagnosis not present

## 2024-03-30 DIAGNOSIS — I70203 Unspecified atherosclerosis of native arteries of extremities, bilateral legs: Secondary | ICD-10-CM | POA: Diagnosis not present

## 2024-03-30 DIAGNOSIS — I472 Ventricular tachycardia, unspecified: Secondary | ICD-10-CM | POA: Diagnosis not present

## 2024-03-30 DIAGNOSIS — N183 Chronic kidney disease, stage 3 unspecified: Secondary | ICD-10-CM | POA: Diagnosis not present

## 2024-03-30 DIAGNOSIS — I700 Atherosclerosis of aorta: Secondary | ICD-10-CM | POA: Diagnosis not present

## 2024-03-30 DIAGNOSIS — K429 Umbilical hernia without obstruction or gangrene: Secondary | ICD-10-CM | POA: Diagnosis not present

## 2024-03-30 DIAGNOSIS — I951 Orthostatic hypotension: Secondary | ICD-10-CM | POA: Diagnosis not present

## 2024-03-30 DIAGNOSIS — N179 Acute kidney failure, unspecified: Secondary | ICD-10-CM | POA: Diagnosis not present

## 2024-03-30 DIAGNOSIS — D6489 Other specified anemias: Secondary | ICD-10-CM | POA: Diagnosis not present

## 2024-03-30 DIAGNOSIS — H5710 Ocular pain, unspecified eye: Secondary | ICD-10-CM | POA: Diagnosis not present

## 2024-03-30 DIAGNOSIS — I255 Ischemic cardiomyopathy: Secondary | ICD-10-CM | POA: Diagnosis not present

## 2024-03-30 DIAGNOSIS — D649 Anemia, unspecified: Secondary | ICD-10-CM | POA: Diagnosis not present

## 2024-03-30 DIAGNOSIS — Z95811 Presence of heart assist device: Secondary | ICD-10-CM | POA: Diagnosis not present

## 2024-03-30 DIAGNOSIS — R0602 Shortness of breath: Secondary | ICD-10-CM | POA: Diagnosis not present

## 2024-03-30 DIAGNOSIS — I5023 Acute on chronic systolic (congestive) heart failure: Secondary | ICD-10-CM | POA: Diagnosis not present

## 2024-03-30 DIAGNOSIS — Z87891 Personal history of nicotine dependence: Secondary | ICD-10-CM | POA: Diagnosis not present

## 2024-04-01 DIAGNOSIS — M79605 Pain in left leg: Secondary | ICD-10-CM | POA: Diagnosis not present

## 2024-04-02 DIAGNOSIS — I70219 Atherosclerosis of native arteries of extremities with intermittent claudication, unspecified extremity: Secondary | ICD-10-CM | POA: Diagnosis not present

## 2024-04-04 DIAGNOSIS — D62 Acute posthemorrhagic anemia: Secondary | ICD-10-CM | POA: Diagnosis not present

## 2024-04-04 DIAGNOSIS — E1151 Type 2 diabetes mellitus with diabetic peripheral angiopathy without gangrene: Secondary | ICD-10-CM | POA: Diagnosis not present

## 2024-04-04 DIAGNOSIS — N183 Chronic kidney disease, stage 3 unspecified: Secondary | ICD-10-CM | POA: Diagnosis not present

## 2024-04-04 DIAGNOSIS — I130 Hypertensive heart and chronic kidney disease with heart failure and stage 1 through stage 4 chronic kidney disease, or unspecified chronic kidney disease: Secondary | ICD-10-CM | POA: Diagnosis not present

## 2024-04-04 DIAGNOSIS — E1122 Type 2 diabetes mellitus with diabetic chronic kidney disease: Secondary | ICD-10-CM | POA: Diagnosis not present

## 2024-04-04 DIAGNOSIS — J449 Chronic obstructive pulmonary disease, unspecified: Secondary | ICD-10-CM | POA: Diagnosis not present

## 2024-04-04 DIAGNOSIS — N179 Acute kidney failure, unspecified: Secondary | ICD-10-CM | POA: Diagnosis not present

## 2024-04-04 DIAGNOSIS — I5023 Acute on chronic systolic (congestive) heart failure: Secondary | ICD-10-CM | POA: Diagnosis not present

## 2024-04-04 DIAGNOSIS — N400 Enlarged prostate without lower urinary tract symptoms: Secondary | ICD-10-CM | POA: Diagnosis not present

## 2024-04-04 DIAGNOSIS — E1140 Type 2 diabetes mellitus with diabetic neuropathy, unspecified: Secondary | ICD-10-CM | POA: Diagnosis not present

## 2024-04-25 DIAGNOSIS — I951 Orthostatic hypotension: Secondary | ICD-10-CM | POA: Diagnosis not present

## 2024-04-25 DIAGNOSIS — D649 Anemia, unspecified: Secondary | ICD-10-CM | POA: Diagnosis not present

## 2024-04-25 DIAGNOSIS — K922 Gastrointestinal hemorrhage, unspecified: Secondary | ICD-10-CM | POA: Diagnosis not present

## 2024-04-25 DIAGNOSIS — I255 Ischemic cardiomyopathy: Secondary | ICD-10-CM | POA: Diagnosis not present

## 2024-04-25 DIAGNOSIS — R42 Dizziness and giddiness: Secondary | ICD-10-CM | POA: Diagnosis not present

## 2024-04-25 DIAGNOSIS — R5383 Other fatigue: Secondary | ICD-10-CM | POA: Diagnosis not present

## 2024-04-25 DIAGNOSIS — W19XXXD Unspecified fall, subsequent encounter: Secondary | ICD-10-CM | POA: Diagnosis not present

## 2024-04-25 DIAGNOSIS — R55 Syncope and collapse: Secondary | ICD-10-CM | POA: Diagnosis not present

## 2024-04-25 DIAGNOSIS — I259 Chronic ischemic heart disease, unspecified: Secondary | ICD-10-CM | POA: Diagnosis not present

## 2024-04-25 DIAGNOSIS — N189 Chronic kidney disease, unspecified: Secondary | ICD-10-CM | POA: Diagnosis not present

## 2024-04-25 DIAGNOSIS — R918 Other nonspecific abnormal finding of lung field: Secondary | ICD-10-CM | POA: Diagnosis not present

## 2024-04-25 DIAGNOSIS — W1849XD Other slipping, tripping and stumbling without falling, subsequent encounter: Secondary | ICD-10-CM | POA: Diagnosis not present

## 2024-04-25 DIAGNOSIS — N179 Acute kidney failure, unspecified: Secondary | ICD-10-CM | POA: Diagnosis not present

## 2024-04-25 DIAGNOSIS — Z95811 Presence of heart assist device: Secondary | ICD-10-CM | POA: Diagnosis not present

## 2024-04-25 DIAGNOSIS — D61818 Other pancytopenia: Secondary | ICD-10-CM | POA: Diagnosis not present

## 2024-04-29 DIAGNOSIS — I5022 Chronic systolic (congestive) heart failure: Secondary | ICD-10-CM | POA: Diagnosis not present

## 2024-05-30 DIAGNOSIS — I5022 Chronic systolic (congestive) heart failure: Secondary | ICD-10-CM | POA: Diagnosis not present

## 2024-06-03 DIAGNOSIS — N419 Inflammatory disease of prostate, unspecified: Secondary | ICD-10-CM | POA: Diagnosis not present

## 2024-06-03 DIAGNOSIS — R109 Unspecified abdominal pain: Secondary | ICD-10-CM | POA: Diagnosis not present

## 2024-06-03 DIAGNOSIS — E1169 Type 2 diabetes mellitus with other specified complication: Secondary | ICD-10-CM | POA: Diagnosis not present

## 2024-06-03 DIAGNOSIS — I6521 Occlusion and stenosis of right carotid artery: Secondary | ICD-10-CM | POA: Diagnosis not present

## 2024-06-03 DIAGNOSIS — N5089 Other specified disorders of the male genital organs: Secondary | ICD-10-CM | POA: Diagnosis not present

## 2024-06-03 DIAGNOSIS — D696 Thrombocytopenia, unspecified: Secondary | ICD-10-CM | POA: Diagnosis not present

## 2024-06-03 DIAGNOSIS — I5022 Chronic systolic (congestive) heart failure: Secondary | ICD-10-CM | POA: Diagnosis not present

## 2024-06-03 DIAGNOSIS — S199XXA Unspecified injury of neck, initial encounter: Secondary | ICD-10-CM | POA: Diagnosis not present

## 2024-06-03 DIAGNOSIS — I951 Orthostatic hypotension: Secondary | ICD-10-CM | POA: Diagnosis not present

## 2024-06-03 DIAGNOSIS — Z95811 Presence of heart assist device: Secondary | ICD-10-CM | POA: Diagnosis not present

## 2024-06-03 DIAGNOSIS — J61 Pneumoconiosis due to asbestos and other mineral fibers: Secondary | ICD-10-CM | POA: Diagnosis not present

## 2024-06-04 DIAGNOSIS — Z955 Presence of coronary angioplasty implant and graft: Secondary | ICD-10-CM | POA: Diagnosis not present

## 2024-06-04 DIAGNOSIS — I5022 Chronic systolic (congestive) heart failure: Secondary | ICD-10-CM | POA: Diagnosis not present

## 2024-06-04 DIAGNOSIS — J61 Pneumoconiosis due to asbestos and other mineral fibers: Secondary | ICD-10-CM | POA: Diagnosis not present

## 2024-06-04 DIAGNOSIS — D696 Thrombocytopenia, unspecified: Secondary | ICD-10-CM | POA: Diagnosis not present

## 2024-06-04 DIAGNOSIS — E1122 Type 2 diabetes mellitus with diabetic chronic kidney disease: Secondary | ICD-10-CM | POA: Diagnosis not present

## 2024-06-04 DIAGNOSIS — I951 Orthostatic hypotension: Secondary | ICD-10-CM | POA: Diagnosis not present

## 2024-06-04 DIAGNOSIS — R109 Unspecified abdominal pain: Secondary | ICD-10-CM | POA: Diagnosis not present

## 2024-06-04 DIAGNOSIS — E1151 Type 2 diabetes mellitus with diabetic peripheral angiopathy without gangrene: Secondary | ICD-10-CM | POA: Diagnosis not present

## 2024-06-04 DIAGNOSIS — E1165 Type 2 diabetes mellitus with hyperglycemia: Secondary | ICD-10-CM | POA: Diagnosis not present

## 2024-06-04 DIAGNOSIS — N183 Chronic kidney disease, stage 3 unspecified: Secondary | ICD-10-CM | POA: Diagnosis not present

## 2024-06-04 DIAGNOSIS — Z95811 Presence of heart assist device: Secondary | ICD-10-CM | POA: Diagnosis not present

## 2024-06-04 DIAGNOSIS — I25810 Atherosclerosis of coronary artery bypass graft(s) without angina pectoris: Secondary | ICD-10-CM | POA: Diagnosis not present

## 2024-06-04 DIAGNOSIS — E871 Hypo-osmolality and hyponatremia: Secondary | ICD-10-CM | POA: Diagnosis not present

## 2024-06-04 DIAGNOSIS — I6521 Occlusion and stenosis of right carotid artery: Secondary | ICD-10-CM | POA: Diagnosis not present

## 2024-06-04 DIAGNOSIS — Z7901 Long term (current) use of anticoagulants: Secondary | ICD-10-CM | POA: Diagnosis not present

## 2024-06-04 DIAGNOSIS — J9811 Atelectasis: Secondary | ICD-10-CM | POA: Diagnosis not present

## 2024-06-04 DIAGNOSIS — S199XXA Unspecified injury of neck, initial encounter: Secondary | ICD-10-CM | POA: Diagnosis not present

## 2024-06-04 DIAGNOSIS — E1142 Type 2 diabetes mellitus with diabetic polyneuropathy: Secondary | ICD-10-CM | POA: Diagnosis not present

## 2024-06-04 DIAGNOSIS — I130 Hypertensive heart and chronic kidney disease with heart failure and stage 1 through stage 4 chronic kidney disease, or unspecified chronic kidney disease: Secondary | ICD-10-CM | POA: Diagnosis not present

## 2024-06-04 DIAGNOSIS — I4891 Unspecified atrial fibrillation: Secondary | ICD-10-CM | POA: Diagnosis not present

## 2024-06-04 DIAGNOSIS — K7460 Unspecified cirrhosis of liver: Secondary | ICD-10-CM | POA: Diagnosis not present

## 2024-06-04 DIAGNOSIS — J449 Chronic obstructive pulmonary disease, unspecified: Secondary | ICD-10-CM | POA: Diagnosis not present

## 2024-06-04 DIAGNOSIS — D638 Anemia in other chronic diseases classified elsewhere: Secondary | ICD-10-CM | POA: Diagnosis not present

## 2024-06-04 DIAGNOSIS — E860 Dehydration: Secondary | ICD-10-CM | POA: Diagnosis not present

## 2024-06-04 DIAGNOSIS — N179 Acute kidney failure, unspecified: Secondary | ICD-10-CM | POA: Diagnosis not present

## 2024-06-04 DIAGNOSIS — I2510 Atherosclerotic heart disease of native coronary artery without angina pectoris: Secondary | ICD-10-CM | POA: Diagnosis not present

## 2024-06-04 DIAGNOSIS — B964 Proteus (mirabilis) (morganii) as the cause of diseases classified elsewhere: Secondary | ICD-10-CM | POA: Diagnosis not present

## 2024-06-15 DIAGNOSIS — I440 Atrioventricular block, first degree: Secondary | ICD-10-CM | POA: Diagnosis not present

## 2024-06-15 DIAGNOSIS — G4733 Obstructive sleep apnea (adult) (pediatric): Secondary | ICD-10-CM | POA: Diagnosis not present

## 2024-06-15 DIAGNOSIS — Z95811 Presence of heart assist device: Secondary | ICD-10-CM | POA: Diagnosis not present

## 2024-06-15 DIAGNOSIS — E1151 Type 2 diabetes mellitus with diabetic peripheral angiopathy without gangrene: Secondary | ICD-10-CM | POA: Diagnosis not present

## 2024-06-15 DIAGNOSIS — D649 Anemia, unspecified: Secondary | ICD-10-CM | POA: Diagnosis not present

## 2024-06-15 DIAGNOSIS — D62 Acute posthemorrhagic anemia: Secondary | ICD-10-CM | POA: Diagnosis not present

## 2024-06-15 DIAGNOSIS — N183 Chronic kidney disease, stage 3 unspecified: Secondary | ICD-10-CM | POA: Diagnosis not present

## 2024-06-15 DIAGNOSIS — I255 Ischemic cardiomyopathy: Secondary | ICD-10-CM | POA: Diagnosis not present

## 2024-06-15 DIAGNOSIS — Z955 Presence of coronary angioplasty implant and graft: Secondary | ICD-10-CM | POA: Diagnosis not present

## 2024-06-15 DIAGNOSIS — I25810 Atherosclerosis of coronary artery bypass graft(s) without angina pectoris: Secondary | ICD-10-CM | POA: Diagnosis not present

## 2024-06-15 DIAGNOSIS — N400 Enlarged prostate without lower urinary tract symptoms: Secondary | ICD-10-CM | POA: Diagnosis not present

## 2024-06-15 DIAGNOSIS — E1122 Type 2 diabetes mellitus with diabetic chronic kidney disease: Secondary | ICD-10-CM | POA: Diagnosis not present

## 2024-06-15 DIAGNOSIS — Z8679 Personal history of other diseases of the circulatory system: Secondary | ICD-10-CM | POA: Diagnosis not present

## 2024-06-15 DIAGNOSIS — I2510 Atherosclerotic heart disease of native coronary artery without angina pectoris: Secondary | ICD-10-CM | POA: Diagnosis not present

## 2024-06-15 DIAGNOSIS — Z7901 Long term (current) use of anticoagulants: Secondary | ICD-10-CM | POA: Diagnosis not present

## 2024-06-15 DIAGNOSIS — E785 Hyperlipidemia, unspecified: Secondary | ICD-10-CM | POA: Diagnosis not present

## 2024-06-15 DIAGNOSIS — R931 Abnormal findings on diagnostic imaging of heart and coronary circulation: Secondary | ICD-10-CM | POA: Diagnosis not present

## 2024-06-15 DIAGNOSIS — I480 Paroxysmal atrial fibrillation: Secondary | ICD-10-CM | POA: Diagnosis not present

## 2024-06-15 DIAGNOSIS — I951 Orthostatic hypotension: Secondary | ICD-10-CM | POA: Diagnosis not present

## 2024-06-15 DIAGNOSIS — I472 Ventricular tachycardia, unspecified: Secondary | ICD-10-CM | POA: Diagnosis not present

## 2024-06-15 DIAGNOSIS — Z87891 Personal history of nicotine dependence: Secondary | ICD-10-CM | POA: Diagnosis not present

## 2024-06-15 DIAGNOSIS — I130 Hypertensive heart and chronic kidney disease with heart failure and stage 1 through stage 4 chronic kidney disease, or unspecified chronic kidney disease: Secondary | ICD-10-CM | POA: Diagnosis not present

## 2024-06-15 DIAGNOSIS — J449 Chronic obstructive pulmonary disease, unspecified: Secondary | ICD-10-CM | POA: Diagnosis not present

## 2024-06-15 DIAGNOSIS — R059 Cough, unspecified: Secondary | ICD-10-CM | POA: Diagnosis not present

## 2024-06-15 DIAGNOSIS — R918 Other nonspecific abnormal finding of lung field: Secondary | ICD-10-CM | POA: Diagnosis not present

## 2024-06-15 DIAGNOSIS — Z95810 Presence of automatic (implantable) cardiac defibrillator: Secondary | ICD-10-CM | POA: Diagnosis not present

## 2024-06-15 DIAGNOSIS — I252 Old myocardial infarction: Secondary | ICD-10-CM | POA: Diagnosis not present

## 2024-06-15 DIAGNOSIS — R0600 Dyspnea, unspecified: Secondary | ICD-10-CM | POA: Diagnosis not present

## 2024-06-15 DIAGNOSIS — D631 Anemia in chronic kidney disease: Secondary | ICD-10-CM | POA: Diagnosis not present

## 2024-06-15 DIAGNOSIS — F32A Depression, unspecified: Secondary | ICD-10-CM | POA: Diagnosis not present

## 2024-06-15 DIAGNOSIS — I5022 Chronic systolic (congestive) heart failure: Secondary | ICD-10-CM | POA: Diagnosis not present

## 2024-06-20 DIAGNOSIS — Z48812 Encounter for surgical aftercare following surgery on the circulatory system: Secondary | ICD-10-CM | POA: Diagnosis not present

## 2024-06-20 DIAGNOSIS — I472 Ventricular tachycardia, unspecified: Secondary | ICD-10-CM | POA: Diagnosis not present

## 2024-06-20 DIAGNOSIS — I480 Paroxysmal atrial fibrillation: Secondary | ICD-10-CM | POA: Diagnosis not present

## 2024-06-20 DIAGNOSIS — I5022 Chronic systolic (congestive) heart failure: Secondary | ICD-10-CM | POA: Diagnosis not present

## 2024-06-20 DIAGNOSIS — E1122 Type 2 diabetes mellitus with diabetic chronic kidney disease: Secondary | ICD-10-CM | POA: Diagnosis not present

## 2024-06-20 DIAGNOSIS — I130 Hypertensive heart and chronic kidney disease with heart failure and stage 1 through stage 4 chronic kidney disease, or unspecified chronic kidney disease: Secondary | ICD-10-CM | POA: Diagnosis not present

## 2024-06-20 DIAGNOSIS — N183 Chronic kidney disease, stage 3 unspecified: Secondary | ICD-10-CM | POA: Diagnosis not present

## 2024-06-20 DIAGNOSIS — D696 Thrombocytopenia, unspecified: Secondary | ICD-10-CM | POA: Diagnosis not present

## 2024-06-20 DIAGNOSIS — D631 Anemia in chronic kidney disease: Secondary | ICD-10-CM | POA: Diagnosis not present

## 2024-06-20 DIAGNOSIS — N179 Acute kidney failure, unspecified: Secondary | ICD-10-CM | POA: Diagnosis not present

## 2024-06-26 DIAGNOSIS — E118 Type 2 diabetes mellitus with unspecified complications: Secondary | ICD-10-CM | POA: Diagnosis not present

## 2024-06-26 DIAGNOSIS — I70212 Atherosclerosis of native arteries of extremities with intermittent claudication, left leg: Secondary | ICD-10-CM | POA: Diagnosis not present

## 2024-06-26 DIAGNOSIS — D696 Thrombocytopenia, unspecified: Secondary | ICD-10-CM | POA: Diagnosis not present

## 2024-06-26 DIAGNOSIS — D649 Anemia, unspecified: Secondary | ICD-10-CM | POA: Diagnosis not present

## 2024-06-29 DIAGNOSIS — D649 Anemia, unspecified: Secondary | ICD-10-CM | POA: Diagnosis not present

## 2024-06-29 DIAGNOSIS — I5022 Chronic systolic (congestive) heart failure: Secondary | ICD-10-CM | POA: Diagnosis not present

## 2024-06-29 DIAGNOSIS — D508 Other iron deficiency anemias: Secondary | ICD-10-CM | POA: Diagnosis not present

## 2024-06-29 DIAGNOSIS — D500 Iron deficiency anemia secondary to blood loss (chronic): Secondary | ICD-10-CM | POA: Diagnosis not present

## 2024-06-29 DIAGNOSIS — I255 Ischemic cardiomyopathy: Secondary | ICD-10-CM | POA: Diagnosis not present

## 2024-06-29 DIAGNOSIS — Z8719 Personal history of other diseases of the digestive system: Secondary | ICD-10-CM | POA: Diagnosis not present

## 2024-06-29 DIAGNOSIS — Z95810 Presence of automatic (implantable) cardiac defibrillator: Secondary | ICD-10-CM | POA: Diagnosis not present

## 2024-07-02 DIAGNOSIS — Z7689 Persons encountering health services in other specified circumstances: Secondary | ICD-10-CM | POA: Diagnosis not present

## 2024-07-03 DIAGNOSIS — E1122 Type 2 diabetes mellitus with diabetic chronic kidney disease: Secondary | ICD-10-CM | POA: Diagnosis not present

## 2024-07-03 DIAGNOSIS — I429 Cardiomyopathy, unspecified: Secondary | ICD-10-CM | POA: Diagnosis not present

## 2024-07-03 DIAGNOSIS — E1142 Type 2 diabetes mellitus with diabetic polyneuropathy: Secondary | ICD-10-CM | POA: Diagnosis not present

## 2024-07-03 DIAGNOSIS — E1159 Type 2 diabetes mellitus with other circulatory complications: Secondary | ICD-10-CM | POA: Diagnosis not present

## 2024-07-03 DIAGNOSIS — Z1331 Encounter for screening for depression: Secondary | ICD-10-CM | POA: Diagnosis not present

## 2024-07-03 DIAGNOSIS — Z0000 Encounter for general adult medical examination without abnormal findings: Secondary | ICD-10-CM | POA: Diagnosis not present

## 2024-07-03 DIAGNOSIS — D509 Iron deficiency anemia, unspecified: Secondary | ICD-10-CM | POA: Diagnosis not present

## 2024-07-03 DIAGNOSIS — D638 Anemia in other chronic diseases classified elsewhere: Secondary | ICD-10-CM | POA: Diagnosis not present

## 2024-07-03 DIAGNOSIS — N1832 Chronic kidney disease, stage 3b: Secondary | ICD-10-CM | POA: Diagnosis not present

## 2024-07-03 DIAGNOSIS — I9589 Other hypotension: Secondary | ICD-10-CM | POA: Diagnosis not present

## 2024-07-03 DIAGNOSIS — E118 Type 2 diabetes mellitus with unspecified complications: Secondary | ICD-10-CM | POA: Diagnosis not present

## 2024-07-05 ENCOUNTER — Institutional Professional Consult (permissible substitution): Admitting: Neurology

## 2024-07-10 DIAGNOSIS — E118 Type 2 diabetes mellitus with unspecified complications: Secondary | ICD-10-CM | POA: Diagnosis not present

## 2024-07-10 DIAGNOSIS — D638 Anemia in other chronic diseases classified elsewhere: Secondary | ICD-10-CM | POA: Diagnosis not present

## 2024-07-12 DIAGNOSIS — Z7901 Long term (current) use of anticoagulants: Secondary | ICD-10-CM | POA: Diagnosis not present

## 2024-07-12 DIAGNOSIS — Z79899 Other long term (current) drug therapy: Secondary | ICD-10-CM | POA: Diagnosis not present

## 2024-07-12 DIAGNOSIS — I5022 Chronic systolic (congestive) heart failure: Secondary | ICD-10-CM | POA: Diagnosis not present

## 2024-07-12 DIAGNOSIS — Z951 Presence of aortocoronary bypass graft: Secondary | ICD-10-CM | POA: Diagnosis not present

## 2024-07-12 DIAGNOSIS — I2510 Atherosclerotic heart disease of native coronary artery without angina pectoris: Secondary | ICD-10-CM | POA: Diagnosis not present

## 2024-07-12 DIAGNOSIS — I4901 Ventricular fibrillation: Secondary | ICD-10-CM | POA: Diagnosis not present

## 2024-07-12 DIAGNOSIS — I259 Chronic ischemic heart disease, unspecified: Secondary | ICD-10-CM | POA: Diagnosis not present

## 2024-07-12 DIAGNOSIS — J441 Chronic obstructive pulmonary disease with (acute) exacerbation: Secondary | ICD-10-CM | POA: Diagnosis not present

## 2024-07-12 DIAGNOSIS — R0602 Shortness of breath: Secondary | ICD-10-CM | POA: Diagnosis not present

## 2024-07-12 DIAGNOSIS — Z95810 Presence of automatic (implantable) cardiac defibrillator: Secondary | ICD-10-CM | POA: Diagnosis not present

## 2024-07-12 DIAGNOSIS — Z95811 Presence of heart assist device: Secondary | ICD-10-CM | POA: Diagnosis not present

## 2024-07-12 DIAGNOSIS — I255 Ischemic cardiomyopathy: Secondary | ICD-10-CM | POA: Diagnosis not present

## 2024-07-14 DIAGNOSIS — E119 Type 2 diabetes mellitus without complications: Secondary | ICD-10-CM | POA: Diagnosis not present

## 2024-07-16 DIAGNOSIS — N183 Chronic kidney disease, stage 3 unspecified: Secondary | ICD-10-CM | POA: Diagnosis not present

## 2024-07-16 DIAGNOSIS — D696 Thrombocytopenia, unspecified: Secondary | ICD-10-CM | POA: Diagnosis not present

## 2024-07-16 DIAGNOSIS — Z48812 Encounter for surgical aftercare following surgery on the circulatory system: Secondary | ICD-10-CM | POA: Diagnosis not present

## 2024-07-16 DIAGNOSIS — D631 Anemia in chronic kidney disease: Secondary | ICD-10-CM | POA: Diagnosis not present

## 2024-07-16 DIAGNOSIS — E1122 Type 2 diabetes mellitus with diabetic chronic kidney disease: Secondary | ICD-10-CM | POA: Diagnosis not present

## 2024-07-16 DIAGNOSIS — I5022 Chronic systolic (congestive) heart failure: Secondary | ICD-10-CM | POA: Diagnosis not present

## 2024-07-16 DIAGNOSIS — I130 Hypertensive heart and chronic kidney disease with heart failure and stage 1 through stage 4 chronic kidney disease, or unspecified chronic kidney disease: Secondary | ICD-10-CM | POA: Diagnosis not present

## 2024-07-17 ENCOUNTER — Encounter: Payer: Self-pay | Admitting: Neurology

## 2024-07-17 ENCOUNTER — Ambulatory Visit (INDEPENDENT_AMBULATORY_CARE_PROVIDER_SITE_OTHER): Admitting: Neurology

## 2024-07-17 VITALS — Ht 67.0 in | Wt 176.0 lb

## 2024-07-17 DIAGNOSIS — Z95811 Presence of heart assist device: Secondary | ICD-10-CM | POA: Diagnosis not present

## 2024-07-17 DIAGNOSIS — I130 Hypertensive heart and chronic kidney disease with heart failure and stage 1 through stage 4 chronic kidney disease, or unspecified chronic kidney disease: Secondary | ICD-10-CM | POA: Diagnosis not present

## 2024-07-17 DIAGNOSIS — Z79899 Other long term (current) drug therapy: Secondary | ICD-10-CM | POA: Diagnosis not present

## 2024-07-17 DIAGNOSIS — I480 Paroxysmal atrial fibrillation: Secondary | ICD-10-CM | POA: Diagnosis not present

## 2024-07-17 DIAGNOSIS — C61 Malignant neoplasm of prostate: Secondary | ICD-10-CM | POA: Diagnosis not present

## 2024-07-17 DIAGNOSIS — I2510 Atherosclerotic heart disease of native coronary artery without angina pectoris: Secondary | ICD-10-CM | POA: Diagnosis not present

## 2024-07-17 DIAGNOSIS — Z9981 Dependence on supplemental oxygen: Secondary | ICD-10-CM

## 2024-07-17 DIAGNOSIS — Z4509 Encounter for adjustment and management of other cardiac device: Secondary | ICD-10-CM | POA: Diagnosis not present

## 2024-07-17 DIAGNOSIS — Z95810 Presence of automatic (implantable) cardiac defibrillator: Secondary | ICD-10-CM

## 2024-07-17 DIAGNOSIS — G4733 Obstructive sleep apnea (adult) (pediatric): Secondary | ICD-10-CM | POA: Diagnosis not present

## 2024-07-17 DIAGNOSIS — I5023 Acute on chronic systolic (congestive) heart failure: Secondary | ICD-10-CM | POA: Diagnosis not present

## 2024-07-17 DIAGNOSIS — D631 Anemia in chronic kidney disease: Secondary | ICD-10-CM | POA: Diagnosis not present

## 2024-07-17 DIAGNOSIS — I447 Left bundle-branch block, unspecified: Secondary | ICD-10-CM | POA: Diagnosis not present

## 2024-07-17 DIAGNOSIS — I519 Heart disease, unspecified: Secondary | ICD-10-CM | POA: Diagnosis not present

## 2024-07-17 DIAGNOSIS — I951 Orthostatic hypotension: Secondary | ICD-10-CM | POA: Diagnosis not present

## 2024-07-17 DIAGNOSIS — N183 Chronic kidney disease, stage 3 unspecified: Secondary | ICD-10-CM | POA: Diagnosis not present

## 2024-07-17 DIAGNOSIS — I255 Ischemic cardiomyopathy: Secondary | ICD-10-CM | POA: Diagnosis not present

## 2024-07-17 DIAGNOSIS — I5022 Chronic systolic (congestive) heart failure: Secondary | ICD-10-CM | POA: Diagnosis not present

## 2024-07-17 DIAGNOSIS — Z951 Presence of aortocoronary bypass graft: Secondary | ICD-10-CM | POA: Diagnosis not present

## 2024-07-17 DIAGNOSIS — E1151 Type 2 diabetes mellitus with diabetic peripheral angiopathy without gangrene: Secondary | ICD-10-CM | POA: Diagnosis not present

## 2024-07-17 DIAGNOSIS — I25119 Atherosclerotic heart disease of native coronary artery with unspecified angina pectoris: Secondary | ICD-10-CM

## 2024-07-17 DIAGNOSIS — N1831 Chronic kidney disease, stage 3a: Secondary | ICD-10-CM

## 2024-07-17 DIAGNOSIS — K922 Gastrointestinal hemorrhage, unspecified: Secondary | ICD-10-CM | POA: Diagnosis not present

## 2024-07-17 DIAGNOSIS — I440 Atrioventricular block, first degree: Secondary | ICD-10-CM | POA: Diagnosis not present

## 2024-07-17 DIAGNOSIS — J441 Chronic obstructive pulmonary disease with (acute) exacerbation: Secondary | ICD-10-CM | POA: Diagnosis not present

## 2024-07-17 DIAGNOSIS — E785 Hyperlipidemia, unspecified: Secondary | ICD-10-CM | POA: Diagnosis not present

## 2024-07-17 DIAGNOSIS — R9431 Abnormal electrocardiogram [ECG] [EKG]: Secondary | ICD-10-CM | POA: Diagnosis not present

## 2024-07-17 DIAGNOSIS — I259 Chronic ischemic heart disease, unspecified: Secondary | ICD-10-CM | POA: Diagnosis not present

## 2024-07-17 DIAGNOSIS — R799 Abnormal finding of blood chemistry, unspecified: Secondary | ICD-10-CM | POA: Diagnosis not present

## 2024-07-17 DIAGNOSIS — D500 Iron deficiency anemia secondary to blood loss (chronic): Secondary | ICD-10-CM | POA: Diagnosis not present

## 2024-07-17 DIAGNOSIS — E1122 Type 2 diabetes mellitus with diabetic chronic kidney disease: Secondary | ICD-10-CM | POA: Diagnosis not present

## 2024-07-17 DIAGNOSIS — K921 Melena: Secondary | ICD-10-CM | POA: Diagnosis not present

## 2024-07-17 DIAGNOSIS — Z7984 Long term (current) use of oral hypoglycemic drugs: Secondary | ICD-10-CM | POA: Diagnosis not present

## 2024-07-17 MED ORDER — ALPRAZOLAM 0.5 MG PO TABS: 0.5000 mg | ORAL_TABLET | Freq: Every evening | ORAL | 0 refills | Status: AC | PRN

## 2024-07-17 NOTE — Patient Instructions (Addendum)
 ASSESSMENT AND PLAN :    78 y.o. year old male here with:    1) Long time CPAP user with external LVAC device and user of CPAP, last renewed machine in 2023.    2) last sleep study before the pandemic. Not tested since the LVAC.  CPAP compliance is excellent : 97% residual AHI is 1.1 and he uses 5 liters of oxygen  with it -      Plan : In order to undergo testing for new baseline , this patient would not be safe to do a HST without CPAP at home.  He is oxygen  dependent and needs an adjustable bed I will order a re-titration study in the lab in order to give his [facility] providers a baseline  he will need oxygen ,  but I like to see some sleep without CPAP ( or on CPAP at 4 cm water to get a feel for the baseline)   XANAX  for sleep aid, please bring to the sleep lab.    I plan to follow up either personally or through our NP within 5 months.

## 2024-07-17 NOTE — Progress Notes (Signed)
 SLEEP MEDICINE CLINIC    Provider:  Dedra Gores, MD  Primary Care Physician:  Alla Amis, MD [phone] [facility] [address] [facility] [name] [address]     Referring Provider: Stoney Alan BIRCH, Np [address] [place],  [name] [address]          Chief Complaint according to patient   Patient presents with:     New Patient (Initial Visit)           HISTORY OF PRESENT ILLNESS:  [name] is a 78 y.o.  right handed male patient who is seen upon [facility] referral on 07/17/2024.  Chief concern according to patient :   [facility] referred [facility] to just assess sleep. He has a CPAP from [facility] he uses. He has 5 L oxygen  that is bled into the CPAP at night. Last SS was well over 5 yrs ago. On avg he sleeps 12-14 hrs of sleep. He wakes up freqently he gets up to void and typically can fall back asleep no issues. Sometimes may have a harder time.  He falls asleep in church now and likes to  improve daytime sleepiness.      The patient had the first sleep study in the year 2005  or 2007  with a result of an AHI ( Apnea Hypopnea index)  of OSA,  [facility]  and he remembers during  hospitalizations for fluid overload, anemia, etc that he was given Oxygen .  He was a [facility]  h hospital , he got an LVAT ,  for left congestive heart failure - he had flash pulmonary edema in 2020 during the COVID  pandemic ( he didn't get Covid until 2022 during another hospital stay at [facility] )  .  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   Sleep relevant medical history: Nocturia/ Enuresis on heavy diuretic , CHF, anemia, hypoxia, No deviated septum , no TBI, no neck injuries.     Family medical /sleep history: Daughter with OSA,.    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  and lives in a private household, widowed since 2024  with a dog,  he has a mini farm.  Tobacco use: quit in 1997.  ETOH  use ; none,  Caffeine intake in form of Coffee( 1 cup in AM ) Soda( mountain dew on Sundays ! ) Tea ( /) or energy drinks Exercise in form of walking  with walker .         Sleep habits are as follows: The patient's dinner time is between 5-6 PM. The patient goes to bed at 9.30 PM and continues to sleep for 6-8 hours, wakes for 2-3 bathroom breaks, the first time at midnight .  The preferred sleep position is right, with the support of elevated bed 5 inches - 10 degrees and 2  pillows.  Dreams are reportedly rare..   The patient wakes up with an alarm. 7  AM is the usual rise time. He reports not feeling refreshed or restored in AM, with symptoms such as dry mouth, lightheadedness, and residual fatigue. Naps are taken frequently,not scheduled,  lasting from 20 to 45 minutes and are slightly more refreshing.    Review of Systems: Out of a complete 14 system review, the patient complains of only the following symptoms, and all other reviewed systems are negative.:  Fatigue, sleepiness , snoring, fragmented  sleep, Nocturia   ON a CPAP issued in 2023.    How likely are you to doze in the following situations: 0 = not likely, 1 = slight chance, 2 = moderate chance, 3 = high chance   Sitting and Reading? Watching Television? Sitting inactive in a public place (theater or meeting)? As a passenger in a car for an hour without a break? Lying down in the afternoon when circumstances permit? Sitting and talking to someone? Sitting quietly after lunch without alcohol? In a car, while stopped for a few minutes in traffic?   Total = 16/ 24 points   FSS endorsed at 57/ 63 points.   Social History   Socioeconomic History   Marital status: Married    Spouse name: Not on file   Number of children: Not on file   Years of education: 12   Highest education level: High school graduate  Occupational History   Occupation: retired  Tobacco Use   Smoking status: Former    Current packs/day: 0.00     Average packs/day: 2.0 packs/day for 30.0 years (60.0 ttl pk-yrs)    Types: Cigarettes    Start date: 12/03/1966    Quit date: 12/03/1996    Years since quitting: 27.6   Smokeless tobacco: Never  Vaping Use   Vaping status: Never Used  Substance and Sexual Activity   Alcohol use: No    Alcohol/week: 0.0 standard drinks of alcohol   Drug use: No   Sexual activity: Not Currently  Other Topics Concern   Not on file  Social History Narrative   Not on file   Social Drivers of Health   Financial Resource Strain: Low Risk  (06/16/2024)   Received from [facility]   Overall Financial Resource Strain (CARDIA)    Difficulty of Paying Living Expenses: Not hard at all  Food Insecurity: No Food Insecurity (06/16/2024)   [facility]   Hunger Vital Sign    Within the past 12 months, you worried that your food would run out before you got the money to buy more.: Never true    Within the past 12 months, the food you bought just didn't last and you didn't have money to get more.: Never true  Transportation Needs: No Transportation Needs (06/17/2024)   Received from [facility] - Transportation    In the past 12 months, has lack of transportation kept you from medical appointments or from getting medications?: No    Lack of Transportation (Non-Medical): No  Physical Activity: Inactive (01/05/2018)   Exercise Vital Sign    Days of Exercise per Week: 0 days    Minutes of Exercise per Session: 0 min  Stress: No Stress Concern Present (01/05/2018)   [employer] of Occupational Health - Occupational Stress Questionnaire    Feeling of Stress : Not at all  Social Connections: Moderately Integrated (01/05/2018)   Social Connection and Isolation Panel    Frequency of Communication with Friends and Family: More than three times a week    Frequency of Social Gatherings with Friends and Family: More than three times a week     Attends Religious Services: More than 4 times per year    Active Member of [employer] or Organizations: No    Attends Banker Meetings: Never    Marital Status: Married    Family History  Problem Relation Age of Onset   CAD Mother    Cancer  Mother    Diabetes Mother    Alzheimer's disease Father    Cancer Father    Heart disease Father     Past Medical History:  Diagnosis Date   Anemia    Cancer (HCC) 12/2013   prostate   Cardiogenic pulmonary edema (HCC) 12/19/2014   Cardiomyopathy, ischemic    CHF (congestive heart failure) (HCC)    COPD (chronic obstructive pulmonary disease) (HCC)    Depression    Diabetes mellitus without complication (HCC)    GERD (gastroesophageal reflux disease)    Gout    Hypercholesteremia    Hypertension    Myocardial infarction (HCC) [id number]   OSA on CPAP    Shortness of breath dyspnea     Past Surgical History:  Procedure Laterality Date   CARDIAC CATHETERIZATION N/A 02/02/2016   Procedure: Left Heart Cath and Coronary Angiography;  Surgeon: Wolm JINNY Rhyme, MD;  Location: [facility] INVASIVE CV LAB;  Service: Cardiovascular;  Laterality: N/A;   CORONARY ANGIOPLASTY WITH STENT PLACEMENT     CORONARY ARTERY BYPASS GRAFT  11/24/2010   CORONARY STENT INTERVENTION N/A 09/04/2018   Procedure: CORONARY STENT INTERVENTION;  Surgeon: Darron Deatrice LABOR, MD;  Location: [facility] INVASIVE CV LAB;  Service: Cardiovascular;  Laterality: N/A;   IMPLANTABLE CARDIOVERTER DEFIBRILLATOR (ICD) GENERATOR CHANGE Left 12/12/2015   Procedure: DUAL LEAD PLACEMENT CARDIAC DIFIBRILLATOR;  Surgeon: Franky LITTIE Ned, MD;  Location: [facility] ORS;  Service: Cardiovascular;  Laterality: Left;   LEFT HEART CATH AND CORS/GRAFTS ANGIOGRAPHY N/A 09/04/2018   Procedure: LEFT HEART CATH AND CORS/GRAFTS ANGIOGRAPHY;  Surgeon: Rhyme Wolm JINNY, MD;  Location: [facility] INVASIVE CV LAB;  Service: Cardiovascular;  Laterality: N/A;   TONSILLECTOMY       Current Outpatient Medications  on File Prior to Visit  Medication Sig Dispense Refill   acetaminophen  (TYLENOL ) 500 MG tablet Take 1,000 mg by mouth daily as needed for moderate pain.     albuterol  (PROVENTIL  HFA;VENTOLIN  HFA) 108 (90 Base) MCG/ACT inhaler Inhale 2 puffs into the lungs every 6 (six) hours as needed for wheezing or shortness of breath. 1 Inhaler 2   amiodarone (PACERONE) 400 MG tablet Take 400 mg by mouth daily. (Patient taking differently: Take 200 mg by mouth daily.)     amoxicillin (AMOXIL) 500 MG capsule Take 1 capsule by mouth 2 (two) times daily.     Cyanocobalamin  (VITAMIN B-12 PO) Take 1 tablet by mouth daily.     Docusate Sodium  (DSS) 100 MG CAPS Take 1 capsule by mouth 2 (two) times daily.     finasteride  (PROSCAR ) 5 MG tablet Take 5 mg by mouth daily.     fludrocortisone (FLORINEF) 0.1 MG tablet Take 0.1 mg by mouth daily.     FLUoxetine  (PROZAC ) 20 MG capsule Take 20 mg by mouth daily.     midodrine (PROAMATINE) 10 MG tablet Take 15 mg by mouth 3 (three) times daily.     mirabegron ER (MYRBETRIQ) 25 MG TB24 tablet Take 25 mg by mouth daily.     Multiple Vitamins-Minerals (CENTRUM SILVER  PO) Take 1 tablet by mouth every morning.     Multiple Vitamins-Minerals (PRESERVISION/LUTEIN ) CAPS Take 1 tablet by mouth 2 (two) times daily.     octreotide (SANDOSTATIN LAR) 20 MG injection Inject 20 mg into the muscle every 28 (twenty-eight) days.     pantoprazole  (PROTONIX ) 40 MG tablet Take 1 tablet by mouth in the morning and at bedtime.     polyethylene glycol powder (GLYCOLAX /MIRALAX ) 17 GM/SCOOP powder Take by  mouth.     potassium chloride  (KLOR-CON ) 10 MEQ tablet Take 10 mEq by mouth daily as needed.     rosuvastatin  (CRESTOR ) 20 MG tablet TAKE ONE TABLET BY MOUTH ONCE EVERY DAY FOR CHOLESTEROL     sitaGLIPtin (JANUVIA) 100 MG tablet Take 100 mg by mouth daily.     tamsulosin  (FLOMAX ) 0.4 MG CAPS capsule Take 0.4 mg by mouth at bedtime.      torsemide  (DEMADEX ) 20 MG tablet Take 40 mg by mouth daily.      finasteride  (PROSCAR ) 5 MG tablet Take 1 tablet by mouth at bedtime.     No current facility-administered medications on file prior to visit.    Allergies  Allergen Reactions   Brilinta  [Ticagrelor ] Shortness Of Breath   Tuberculin Tests Rash   Benadryl  [Diphenhydramine ] Other (See Comments)     Hyperactivity   Doxycycline Swelling    Pt went into pulmonary edema.   Lopid [Gemfibrozil] Swelling    I gain 1 pound a day for 30 days.     DIAGNOSTIC DATA (LABS, IMAGING, TESTING) - I reviewed patient records, labs, notes, testing and imaging myself where available.  Lab Results  Component Value Date   WBC 7.6 05/31/2019   HGB 9.2 (L) 05/31/2019   HCT 30.0 (L) 05/31/2019   MCV 87.5 05/31/2019   PLT 233 05/31/2019      Component Value Date/Time   NA 141 05/31/2019 0350   NA 138 12/11/2014 2140   K 3.5 05/31/2019 0350   K 3.3 (L) 12/11/2014 2140   CL 105 05/31/2019 0350   CL 103 12/11/2014 2140   CO2 26 05/31/2019 0350   CO2 22 12/11/2014 2140   GLUCOSE 120 (H) 05/31/2019 0350   GLUCOSE 273 (H) 12/11/2014 2140   BUN 27 (H) 05/31/2019 0350   BUN 15 12/11/2014 2140   CREATININE 1.12 05/31/2019 0350   CREATININE 1.29 12/11/2014 2140   CALCIUM  8.4 (L) 05/31/2019 0350   CALCIUM  7.2 (L) 12/11/2014 2140   PROT 6.7 05/28/2019 0553   PROT 7.8 12/11/2014 2140   ALBUMIN 3.0 (L) 05/28/2019 0553   ALBUMIN 3.5 12/11/2014 2140   AST 20 05/28/2019 0553   AST 39 (H) 12/11/2014 2140   ALT 14 05/28/2019 0553   ALT 51 12/11/2014 2140   ALKPHOS 75 05/28/2019 0553   ALKPHOS 80 12/11/2014 2140   BILITOT 0.9 05/28/2019 0553   BILITOT 0.4 12/11/2014 2140   GFRNONAA >60 05/31/2019 0350   GFRNONAA 59 (L) 12/11/2014 2140   GFRNONAA >60 02/26/2014 2225   GFRAA >60 05/31/2019 0350   GFRAA >60 12/11/2014 2140   GFRAA >60 02/26/2014 2225   Lab Results  Component Value Date   CHOL 140 01/05/2014   HDL 33 (L) 01/05/2014   LDLCALC 86 01/05/2014   TRIG 103 01/05/2014   Lab Results   Component Value Date   HGBA1C 5.5 05/26/2019   No results found for: [id number] Lab Results  Component Value Date   TSH 2.380 05/26/2019    PHYSICAL EXAM:  Today's Vitals   07/17/24 1011  Weight: 176 lb (79.8 kg)  Height: 5' 7 (1.702 m)   Body mass index is 27.57 kg/m.   Wt Readings from Last 3 Encounters:  07/17/24 176 lb (79.8 kg)  08/06/21 202 lb 11.2 oz (91.9 kg)  06/16/21 213 lb 6.4 oz (96.8 kg)     Ht Readings from Last 3 Encounters:  07/17/24 5' 7 (1.702 m)  08/06/21 5' 7 (1.702 m)  06/16/21  5' 7 (1.702 m)      General: The patient is awake, alert and appears not in acute distress. The patient is well groomed. Head: Normocephalic, atraumatic. Neck is supple.  Mallampati 3,  neck circumference:15 inches . Nasal airflow patent.  Retrognathia is not seen.  Dental status: edentulos, dentures  Cardiovascular:  Regular rate and cardiac rhythm by pulse,  without distended neck veins. Respiratory: Lungs are clear to auscultation.  Skin:  Without evidence of ankle edema, or rash. Trunk: The patient's posture is erect.   NEUROLOGIC EXAM: The patient is awake and alert, oriented to place and time.   Memory subjective described as intact.  Attention span & concentration ability appears normal.  Speech is fluent,  without  dysarthria, dysphonia or aphasia.  Mood and affect are appropriate.   Cranial nerves: no loss of smell or taste reported  Pupils are equal and briskly reactive to light. Funduscopic exam deferred.  Extraocular movements in vertical and horizontal planes were intact and without nystagmus. No Diplopia. Visual fields by finger perimetry are intact. Hearing was impaired - with hearing aids.  Rinne weber lateralization to the left for bone conduction.    Facial sensation intact to fine touch.  Facial motor strength is symmetric and tongue and uvula move midline.  Neck ROM : rotation, tilt and flexion extension were normal for age and shoulder  shrug was symmetrical.    Motor exam:  Symmetric bulk, tone and ROM.   Normal tone without cog -wheeling, symmetric grip strength .   Sensory:   Proprioception tested in the upper extremities was normal. Vibration was felt in both knees and hands but reduced at ankles.    Coordination: Rapid alternating movements in the fingers/hands were of normal speed.  The Finger-to-nose maneuver was intact without evidence of ataxia, dysmetria or tremor.   Gait and station: Patient  walked with a seated walker assistive device. ( 5 years )  . Small steps.  Deep tendon reflexes: in the  upper and lower extremities are symmetric and intact.  Babinski response was deferred     ASSESSMENT AND PLAN 78 y.o. year old male  here with:    1) Long time CPAP user with external L VAC device and user of CPAP, last renewed machine in 2023.    2) last sleep study before the pandemic. Not tested since the LVAC.  CPAP compliance is excellent : 97% residual AHI is 1.1 and he uses 5 liters of oxygen  with it -      Plan : In order to undergo testing for new baseline , this patient would not be safe to do a HST without CPAP at home.  He is oxygen  dependent and needs an adjustable bed I will order a re-titration study in the lab in order to give his [facility] providers a baseline  he will need oxygen ,  but I like to see some sleep without CPAP ( or on CPAP at 4 cm water to get a feel for the baseline)    Xanax  sleep aid prn.  I plan to follow up either personally or through our NP within 5 months.   I would like to thank Alla Amis, MD and Stoney Alan BIRCH, Np [address] [place],  [place] [address] for allowing me to meet with and to take care of this pleasant patient.   Discussion of sleep hygiene setting bedtime and rise time,  hot shower  before bed time, no screen light in the bedroom, the bedroom  should be cool, quiet and dark. Night lights should illuminate the floor not shine into your eyes. Golden glow   light is less intrusive than blue or cold light.  Read in a book with pages, not on a device. Consider audio books and soothing  sound -scapes.   CC: I will share my notes with [facility].  After spending a total time of  45  minutes face to face and additional time for physical and neurologic examination, review of laboratory studies,  personal review of imaging studies, reports and results of other testing and review of referral information / records as far as provided in visit,   Electronically signed by: Dedra Gores, MD 07/17/2024 10:18 AM  [facility] Board certified by The [employer] of Sleep Medicine and Diplomate of the [employer] of Sleep Medicine. Board certified In Neurology through the ABPN, Fellow of the [employer] of Neurology.

## 2024-07-17 NOTE — Progress Notes (Signed)
 SABRA

## 2024-07-18 ENCOUNTER — Telehealth: Payer: Self-pay | Admitting: Neurology

## 2024-07-18 NOTE — Telephone Encounter (Signed)
 LVM    [facility] shara: [id number] (exp. 06/07/24 to 12/04/24)

## 2024-07-23 NOTE — Telephone Encounter (Signed)
 X2 Left voicemail for patient to call back. Left my direct number.

## 2024-07-25 DIAGNOSIS — D649 Anemia, unspecified: Secondary | ICD-10-CM | POA: Diagnosis not present

## 2024-07-30 DIAGNOSIS — I5022 Chronic systolic (congestive) heart failure: Secondary | ICD-10-CM | POA: Diagnosis not present

## 2024-07-30 NOTE — Telephone Encounter (Signed)
 The patient left a voicemail on my phone asking that I call his son to schedule the SS.. I spoke with Harden the patient son.   CPAP [facility] shara: [id number] (exp. 06/07/24 to 12/04/24)   He is scheduled at [facility] for 08/27/2024 at 8 pm.  Mailed packet to the patient.

## 2024-08-08 DIAGNOSIS — D649 Anemia, unspecified: Secondary | ICD-10-CM | POA: Diagnosis not present

## 2024-08-25 DIAGNOSIS — Z9889 Other specified postprocedural states: Secondary | ICD-10-CM | POA: Diagnosis not present

## 2024-08-25 DIAGNOSIS — J9811 Atelectasis: Secondary | ICD-10-CM | POA: Diagnosis not present

## 2024-08-25 DIAGNOSIS — I517 Cardiomegaly: Secondary | ICD-10-CM | POA: Diagnosis not present

## 2024-08-25 DIAGNOSIS — R531 Weakness: Secondary | ICD-10-CM | POA: Diagnosis not present

## 2024-08-25 DIAGNOSIS — R4182 Altered mental status, unspecified: Secondary | ICD-10-CM | POA: Diagnosis not present

## 2024-08-25 DIAGNOSIS — R0602 Shortness of breath: Secondary | ICD-10-CM | POA: Diagnosis not present

## 2024-08-25 DIAGNOSIS — I255 Ischemic cardiomyopathy: Secondary | ICD-10-CM | POA: Diagnosis not present

## 2024-08-25 DIAGNOSIS — J90 Pleural effusion, not elsewhere classified: Secondary | ICD-10-CM | POA: Diagnosis not present

## 2024-08-25 DIAGNOSIS — J811 Chronic pulmonary edema: Secondary | ICD-10-CM | POA: Diagnosis not present

## 2024-08-25 DIAGNOSIS — T827XXA Infection and inflammatory reaction due to other cardiac and vascular devices, implants and grafts, initial encounter: Secondary | ICD-10-CM | POA: Diagnosis not present

## 2024-08-25 DIAGNOSIS — K766 Portal hypertension: Secondary | ICD-10-CM | POA: Diagnosis not present

## 2024-08-25 DIAGNOSIS — R931 Abnormal findings on diagnostic imaging of heart and coronary circulation: Secondary | ICD-10-CM | POA: Diagnosis not present

## 2024-08-25 DIAGNOSIS — K7460 Unspecified cirrhosis of liver: Secondary | ICD-10-CM | POA: Diagnosis not present

## 2024-08-25 DIAGNOSIS — R5081 Fever presenting with conditions classified elsewhere: Secondary | ICD-10-CM | POA: Diagnosis not present

## 2024-08-25 DIAGNOSIS — I110 Hypertensive heart disease with heart failure: Secondary | ICD-10-CM | POA: Diagnosis not present

## 2024-08-25 DIAGNOSIS — D61818 Other pancytopenia: Secondary | ICD-10-CM | POA: Diagnosis not present

## 2024-08-25 DIAGNOSIS — J810 Acute pulmonary edema: Secondary | ICD-10-CM | POA: Diagnosis not present

## 2024-08-25 DIAGNOSIS — R197 Diarrhea, unspecified: Secondary | ICD-10-CM | POA: Diagnosis not present

## 2024-08-25 DIAGNOSIS — R918 Other nonspecific abnormal finding of lung field: Secondary | ICD-10-CM | POA: Diagnosis not present

## 2024-08-25 DIAGNOSIS — R109 Unspecified abdominal pain: Secondary | ICD-10-CM | POA: Diagnosis not present

## 2024-08-25 DIAGNOSIS — D696 Thrombocytopenia, unspecified: Secondary | ICD-10-CM | POA: Diagnosis not present

## 2024-08-25 DIAGNOSIS — I5089 Other heart failure: Secondary | ICD-10-CM | POA: Diagnosis not present

## 2024-08-25 DIAGNOSIS — Z95811 Presence of heart assist device: Secondary | ICD-10-CM | POA: Diagnosis not present

## 2024-08-25 DIAGNOSIS — I5022 Chronic systolic (congestive) heart failure: Secondary | ICD-10-CM | POA: Diagnosis not present

## 2024-08-25 DIAGNOSIS — Z95810 Presence of automatic (implantable) cardiac defibrillator: Secondary | ICD-10-CM | POA: Diagnosis not present

## 2024-08-25 DIAGNOSIS — R509 Fever, unspecified: Secondary | ICD-10-CM | POA: Diagnosis not present

## 2024-08-27 ENCOUNTER — Encounter

## 2024-08-27 NOTE — Telephone Encounter (Signed)
 Patient son called and cancel the SS due to he is admitted to [facility] ER. They will call back when he can to r/s.

## 2024-08-30 DIAGNOSIS — I5022 Chronic systolic (congestive) heart failure: Secondary | ICD-10-CM | POA: Diagnosis not present

## 2024-08-31 DIAGNOSIS — E119 Type 2 diabetes mellitus without complications: Secondary | ICD-10-CM | POA: Diagnosis not present

## 2024-09-01 DIAGNOSIS — M79622 Pain in left upper arm: Secondary | ICD-10-CM | POA: Diagnosis not present

## 2024-09-01 DIAGNOSIS — S3992XA Unspecified injury of lower back, initial encounter: Secondary | ICD-10-CM | POA: Diagnosis not present

## 2024-09-01 DIAGNOSIS — Y9301 Activity, walking, marching and hiking: Secondary | ICD-10-CM | POA: Diagnosis not present

## 2024-09-01 DIAGNOSIS — I447 Left bundle-branch block, unspecified: Secondary | ICD-10-CM | POA: Diagnosis not present

## 2024-09-01 DIAGNOSIS — I5022 Chronic systolic (congestive) heart failure: Secondary | ICD-10-CM | POA: Diagnosis not present

## 2024-09-01 DIAGNOSIS — M25522 Pain in left elbow: Secondary | ICD-10-CM | POA: Diagnosis not present

## 2024-09-01 DIAGNOSIS — A4181 Sepsis due to Enterococcus: Secondary | ICD-10-CM | POA: Diagnosis not present

## 2024-09-01 DIAGNOSIS — M47812 Spondylosis without myelopathy or radiculopathy, cervical region: Secondary | ICD-10-CM | POA: Diagnosis not present

## 2024-09-01 DIAGNOSIS — S0003XA Contusion of scalp, initial encounter: Secondary | ICD-10-CM | POA: Diagnosis not present

## 2024-09-01 DIAGNOSIS — R188 Other ascites: Secondary | ICD-10-CM | POA: Diagnosis not present

## 2024-09-01 DIAGNOSIS — S41112A Laceration without foreign body of left upper arm, initial encounter: Secondary | ICD-10-CM | POA: Diagnosis not present

## 2024-09-01 DIAGNOSIS — M25532 Pain in left wrist: Secondary | ICD-10-CM | POA: Diagnosis not present

## 2024-09-01 DIAGNOSIS — W19XXXD Unspecified fall, subsequent encounter: Secondary | ICD-10-CM | POA: Diagnosis not present

## 2024-09-01 DIAGNOSIS — I951 Orthostatic hypotension: Secondary | ICD-10-CM | POA: Diagnosis not present

## 2024-09-01 DIAGNOSIS — W19XXXA Unspecified fall, initial encounter: Secondary | ICD-10-CM | POA: Diagnosis not present

## 2024-09-01 DIAGNOSIS — S0083XA Contusion of other part of head, initial encounter: Secondary | ICD-10-CM | POA: Diagnosis not present

## 2024-09-01 DIAGNOSIS — M25512 Pain in left shoulder: Secondary | ICD-10-CM | POA: Diagnosis not present

## 2024-09-01 DIAGNOSIS — K7460 Unspecified cirrhosis of liver: Secondary | ICD-10-CM | POA: Diagnosis not present

## 2024-09-01 DIAGNOSIS — S0993XA Unspecified injury of face, initial encounter: Secondary | ICD-10-CM | POA: Diagnosis not present

## 2024-09-01 DIAGNOSIS — K766 Portal hypertension: Secondary | ICD-10-CM | POA: Diagnosis not present

## 2024-09-01 DIAGNOSIS — S40011A Contusion of right shoulder, initial encounter: Secondary | ICD-10-CM | POA: Diagnosis not present

## 2024-09-01 DIAGNOSIS — E119 Type 2 diabetes mellitus without complications: Secondary | ICD-10-CM | POA: Diagnosis not present

## 2024-09-01 DIAGNOSIS — R52 Pain, unspecified: Secondary | ICD-10-CM | POA: Diagnosis not present

## 2024-09-01 DIAGNOSIS — Z8679 Personal history of other diseases of the circulatory system: Secondary | ICD-10-CM | POA: Diagnosis not present

## 2024-09-01 DIAGNOSIS — S4991XA Unspecified injury of right shoulder and upper arm, initial encounter: Secondary | ICD-10-CM | POA: Diagnosis not present

## 2024-09-01 DIAGNOSIS — Z950 Presence of cardiac pacemaker: Secondary | ICD-10-CM | POA: Diagnosis not present

## 2024-09-01 DIAGNOSIS — I255 Ischemic cardiomyopathy: Secondary | ICD-10-CM | POA: Diagnosis not present

## 2024-09-01 DIAGNOSIS — S199XXA Unspecified injury of neck, initial encounter: Secondary | ICD-10-CM | POA: Diagnosis not present

## 2024-09-01 DIAGNOSIS — S3991XA Unspecified injury of abdomen, initial encounter: Secondary | ICD-10-CM | POA: Diagnosis not present

## 2024-09-01 DIAGNOSIS — S301XXA Contusion of abdominal wall, initial encounter: Secondary | ICD-10-CM | POA: Diagnosis not present

## 2024-09-01 DIAGNOSIS — S3993XA Unspecified injury of pelvis, initial encounter: Secondary | ICD-10-CM | POA: Diagnosis not present

## 2024-09-01 DIAGNOSIS — Z95811 Presence of heart assist device: Secondary | ICD-10-CM | POA: Diagnosis not present

## 2024-09-01 DIAGNOSIS — Y92009 Unspecified place in unspecified non-institutional (private) residence as the place of occurrence of the external cause: Secondary | ICD-10-CM | POA: Diagnosis not present

## 2024-09-01 DIAGNOSIS — S299XXA Unspecified injury of thorax, initial encounter: Secondary | ICD-10-CM | POA: Diagnosis not present

## 2024-09-01 DIAGNOSIS — S20219A Contusion of unspecified front wall of thorax, initial encounter: Secondary | ICD-10-CM | POA: Diagnosis not present

## 2024-09-01 DIAGNOSIS — J90 Pleural effusion, not elsewhere classified: Secondary | ICD-10-CM | POA: Diagnosis not present

## 2024-09-01 DIAGNOSIS — R918 Other nonspecific abnormal finding of lung field: Secondary | ICD-10-CM | POA: Diagnosis not present

## 2024-09-01 DIAGNOSIS — S62102A Fracture of unspecified carpal bone, left wrist, initial encounter for closed fracture: Secondary | ICD-10-CM | POA: Diagnosis not present

## 2024-09-01 DIAGNOSIS — D649 Anemia, unspecified: Secondary | ICD-10-CM | POA: Diagnosis not present

## 2024-09-04 DIAGNOSIS — Z95811 Presence of heart assist device: Secondary | ICD-10-CM | POA: Diagnosis not present

## 2024-09-04 DIAGNOSIS — R5383 Other fatigue: Secondary | ICD-10-CM | POA: Diagnosis not present

## 2024-09-04 DIAGNOSIS — Z79899 Other long term (current) drug therapy: Secondary | ICD-10-CM | POA: Diagnosis not present

## 2024-09-04 DIAGNOSIS — I255 Ischemic cardiomyopathy: Secondary | ICD-10-CM | POA: Diagnosis not present

## 2024-09-04 DIAGNOSIS — I5022 Chronic systolic (congestive) heart failure: Secondary | ICD-10-CM | POA: Diagnosis not present

## 2024-09-04 DIAGNOSIS — Z951 Presence of aortocoronary bypass graft: Secondary | ICD-10-CM | POA: Diagnosis not present

## 2024-09-04 DIAGNOSIS — Z95810 Presence of automatic (implantable) cardiac defibrillator: Secondary | ICD-10-CM | POA: Diagnosis not present

## 2024-09-04 DIAGNOSIS — I4901 Ventricular fibrillation: Secondary | ICD-10-CM | POA: Diagnosis not present

## 2024-09-04 DIAGNOSIS — R0602 Shortness of breath: Secondary | ICD-10-CM | POA: Diagnosis not present

## 2024-09-04 DIAGNOSIS — I071 Rheumatic tricuspid insufficiency: Secondary | ICD-10-CM | POA: Diagnosis not present

## 2024-09-04 DIAGNOSIS — I259 Chronic ischemic heart disease, unspecified: Secondary | ICD-10-CM | POA: Diagnosis not present

## 2024-09-04 DIAGNOSIS — J441 Chronic obstructive pulmonary disease with (acute) exacerbation: Secondary | ICD-10-CM | POA: Diagnosis not present

## 2024-09-04 DIAGNOSIS — I2510 Atherosclerotic heart disease of native coronary artery without angina pectoris: Secondary | ICD-10-CM | POA: Diagnosis not present

## 2024-09-18 DIAGNOSIS — K7469 Other cirrhosis of liver: Secondary | ICD-10-CM | POA: Diagnosis not present

## 2024-09-18 DIAGNOSIS — K469 Unspecified abdominal hernia without obstruction or gangrene: Secondary | ICD-10-CM | POA: Diagnosis not present

## 2024-09-18 DIAGNOSIS — J90 Pleural effusion, not elsewhere classified: Secondary | ICD-10-CM | POA: Diagnosis not present

## 2024-09-18 DIAGNOSIS — R5383 Other fatigue: Secondary | ICD-10-CM | POA: Diagnosis not present

## 2024-09-18 DIAGNOSIS — Z87898 Personal history of other specified conditions: Secondary | ICD-10-CM | POA: Diagnosis not present

## 2024-09-18 DIAGNOSIS — I5023 Acute on chronic systolic (congestive) heart failure: Secondary | ICD-10-CM | POA: Diagnosis not present

## 2024-09-18 DIAGNOSIS — R5381 Other malaise: Secondary | ICD-10-CM | POA: Diagnosis not present

## 2024-09-18 DIAGNOSIS — R627 Adult failure to thrive: Secondary | ICD-10-CM | POA: Diagnosis not present

## 2024-09-18 DIAGNOSIS — D696 Thrombocytopenia, unspecified: Secondary | ICD-10-CM | POA: Diagnosis not present

## 2024-09-18 DIAGNOSIS — I5043 Acute on chronic combined systolic (congestive) and diastolic (congestive) heart failure: Secondary | ICD-10-CM | POA: Diagnosis not present

## 2024-09-18 DIAGNOSIS — Z95811 Presence of heart assist device: Secondary | ICD-10-CM | POA: Diagnosis not present

## 2024-09-18 DIAGNOSIS — R140 Abdominal distension (gaseous): Secondary | ICD-10-CM | POA: Diagnosis not present

## 2024-09-18 DIAGNOSIS — R0602 Shortness of breath: Secondary | ICD-10-CM | POA: Diagnosis not present

## 2024-09-18 DIAGNOSIS — R188 Other ascites: Secondary | ICD-10-CM | POA: Diagnosis not present

## 2024-09-18 DIAGNOSIS — R918 Other nonspecific abnormal finding of lung field: Secondary | ICD-10-CM | POA: Diagnosis not present

## 2024-09-18 DIAGNOSIS — D649 Anemia, unspecified: Secondary | ICD-10-CM | POA: Diagnosis not present

## 2024-09-22 DIAGNOSIS — J811 Chronic pulmonary edema: Secondary | ICD-10-CM | POA: Diagnosis not present

## 2024-09-22 DIAGNOSIS — I50810 Right heart failure, unspecified: Secondary | ICD-10-CM | POA: Diagnosis not present

## 2024-09-22 DIAGNOSIS — K766 Portal hypertension: Secondary | ICD-10-CM | POA: Diagnosis not present

## 2024-09-22 DIAGNOSIS — Z95810 Presence of automatic (implantable) cardiac defibrillator: Secondary | ICD-10-CM | POA: Diagnosis not present

## 2024-09-22 DIAGNOSIS — R7402 Elevation of levels of lactic acid dehydrogenase (LDH): Secondary | ICD-10-CM | POA: Diagnosis not present

## 2024-09-22 DIAGNOSIS — N179 Acute kidney failure, unspecified: Secondary | ICD-10-CM | POA: Diagnosis not present

## 2024-09-22 DIAGNOSIS — G4733 Obstructive sleep apnea (adult) (pediatric): Secondary | ICD-10-CM | POA: Diagnosis not present

## 2024-09-22 DIAGNOSIS — I4901 Ventricular fibrillation: Secondary | ICD-10-CM | POA: Diagnosis not present

## 2024-09-22 DIAGNOSIS — R5381 Other malaise: Secondary | ICD-10-CM | POA: Diagnosis not present

## 2024-09-22 DIAGNOSIS — Z452 Encounter for adjustment and management of vascular access device: Secondary | ICD-10-CM | POA: Diagnosis not present

## 2024-09-22 DIAGNOSIS — R918 Other nonspecific abnormal finding of lung field: Secondary | ICD-10-CM | POA: Diagnosis not present

## 2024-09-22 DIAGNOSIS — J90 Pleural effusion, not elsewhere classified: Secondary | ICD-10-CM | POA: Diagnosis not present

## 2024-09-22 DIAGNOSIS — E785 Hyperlipidemia, unspecified: Secondary | ICD-10-CM | POA: Diagnosis not present

## 2024-09-22 DIAGNOSIS — Z951 Presence of aortocoronary bypass graft: Secondary | ICD-10-CM | POA: Diagnosis not present

## 2024-09-22 DIAGNOSIS — I5022 Chronic systolic (congestive) heart failure: Secondary | ICD-10-CM | POA: Diagnosis not present

## 2024-09-22 DIAGNOSIS — R627 Adult failure to thrive: Secondary | ICD-10-CM | POA: Diagnosis not present

## 2024-09-22 DIAGNOSIS — E119 Type 2 diabetes mellitus without complications: Secondary | ICD-10-CM | POA: Diagnosis not present

## 2024-09-22 DIAGNOSIS — Z87891 Personal history of nicotine dependence: Secondary | ICD-10-CM | POA: Diagnosis not present

## 2024-09-22 DIAGNOSIS — Z7189 Other specified counseling: Secondary | ICD-10-CM | POA: Diagnosis not present

## 2024-09-22 DIAGNOSIS — Z9989 Dependence on other enabling machines and devices: Secondary | ICD-10-CM | POA: Diagnosis not present

## 2024-09-22 DIAGNOSIS — I519 Heart disease, unspecified: Secondary | ICD-10-CM | POA: Diagnosis not present

## 2024-09-22 DIAGNOSIS — R188 Other ascites: Secondary | ICD-10-CM | POA: Diagnosis not present

## 2024-09-22 DIAGNOSIS — R404 Transient alteration of awareness: Secondary | ICD-10-CM | POA: Diagnosis not present

## 2024-09-22 DIAGNOSIS — Z79899 Other long term (current) drug therapy: Secondary | ICD-10-CM | POA: Diagnosis not present

## 2024-10-17 DIAGNOSIS — I10 Essential (primary) hypertension: Secondary | ICD-10-CM | POA: Diagnosis not present

## 2024-10-17 DIAGNOSIS — I259 Chronic ischemic heart disease, unspecified: Secondary | ICD-10-CM | POA: Diagnosis not present

## 2024-10-17 DIAGNOSIS — J441 Chronic obstructive pulmonary disease with (acute) exacerbation: Secondary | ICD-10-CM | POA: Diagnosis not present

## 2024-10-17 DIAGNOSIS — I5043 Acute on chronic combined systolic (congestive) and diastolic (congestive) heart failure: Secondary | ICD-10-CM | POA: Diagnosis not present

## 2024-10-17 DIAGNOSIS — I5022 Chronic systolic (congestive) heart failure: Secondary | ICD-10-CM | POA: Diagnosis not present

## 2024-10-17 DIAGNOSIS — I255 Ischemic cardiomyopathy: Secondary | ICD-10-CM | POA: Diagnosis not present

## 2024-10-17 DIAGNOSIS — Z79899 Other long term (current) drug therapy: Secondary | ICD-10-CM | POA: Diagnosis not present

## 2024-10-17 DIAGNOSIS — R0602 Shortness of breath: Secondary | ICD-10-CM | POA: Diagnosis not present

## 2024-10-17 DIAGNOSIS — Z95810 Presence of automatic (implantable) cardiac defibrillator: Secondary | ICD-10-CM | POA: Diagnosis not present

## 2024-10-17 DIAGNOSIS — R5383 Other fatigue: Secondary | ICD-10-CM | POA: Diagnosis not present

## 2024-10-17 DIAGNOSIS — I4901 Ventricular fibrillation: Secondary | ICD-10-CM | POA: Diagnosis not present

## 2024-10-17 DIAGNOSIS — I2510 Atherosclerotic heart disease of native coronary artery without angina pectoris: Secondary | ICD-10-CM | POA: Diagnosis not present

## 2024-10-30 DIAGNOSIS — I5022 Chronic systolic (congestive) heart failure: Secondary | ICD-10-CM | POA: Diagnosis not present

## 2024-11-05 DEATH — deceased
